# Patient Record
Sex: Male | Born: 1950
Health system: Southern US, Community
[De-identification: ages and names within clinical notes are randomized; demographics above are authoritative.]

## PROBLEM LIST (undated history)

## (undated) DIAGNOSIS — C801 Malignant (primary) neoplasm, unspecified: Secondary | ICD-10-CM

## (undated) DIAGNOSIS — E119 Type 2 diabetes mellitus without complications: Secondary | ICD-10-CM

## (undated) DIAGNOSIS — I10 Essential (primary) hypertension: Secondary | ICD-10-CM

## (undated) HISTORY — PX: APPENDECTOMY: SHX54

## (undated) HISTORY — PX: TONSILLECTOMY: SUR1361

## (undated) NOTE — *Deleted (*Deleted)
HEMATOLOGY/ONCOLOGY CLINIC NOTE  Date of Service: 04/30/2020  Patient Care Team: Hamrick, Durward Fortes, MD as PCP - General (Family Medicine)  CHIEF COMPLAINTS/PURPOSE OF CONSULTATION:  F/u for mantle cell lymphoma  HISTORY OF PRESENTING ILLNESS:   Dean Dixon is a wonderful 12 y.o. male who has been referred to [place] by Dr Burnell Blanks for evaluation and management of Lymphocytosis. The pt reports that he is doing well overall.   The pt reports that he has had a high lymphocyte count for several years but it has never been as high as now. 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 This most recent lymphocytosis was picked up by him looking at his own blood in the lab. He has not felt any differently in the last 6 months to 1 year. Pt has arthritis, tendonitis and bursitis in his shoulder. He has not had any concerns of RA and no significantly swollen joints. Pt tore an achilles tendon and then flew on a plane which lead to a blood clot. He has been on a testosterone supplement before but has not used it in a while. Pt has had diverticulitis. He has been losing weight slowly by eating better but has not noticed any unexpected weight loss. Pt does not smoke, does not share needles and has not needed any blood transfusions in the past. Pt denies any respiratory illness or infection symptoms in the last few months. He used to have an elevated IgM level about 30 years ago. He does have several fatty lipomas, a few which he has had removed, but there have been no concerns associated with them. He has recently stopped taking herbal supplements until he can figure out what is going on with his blood counts. He has been taking Glucosamine, Chondroitin & MSM as well as a 25 mg CBD gummy and 10 mg of CBD SL for his joint pain for a few years. He is currently taking Glipizide and Metformin. The Metformin did give the pt diarrhea, which has since resolved.   Most recent lab results (04/12/19) of CBC w/diff and CMP is  as follows: all values are WNL except for Glucose at 191, WBC at 30.5K, Lymphs Abs at 19.6K, Mono Abs at 3.6K, Eos Abs at 0.5K, Baso Abs at 0.3K, Abs Immature Granulocytes at 0.3K. 04/12/2019 PSA at 2.2  On review of systems, pt reports joint pain and denies rashes, fevers, chills, night sweats, unexpected weight loss, issues with swallowing, abdominal pain, bowel movement issues, leg pain, respiratory symptoms, diarrhea and any other symptoms.   On PMHx the pt reports HTN, Diabetes, Arthritis, Diverticulitis. On Social Hx the pt reports that he is a non-smoker.  INTERVAL HISTORY:  Dean Dixon is a wonderful 64 y.o. male who is here for evaluation and management of Mantle Cell Lymphoma. The patient's last visit with [place] was on 04/19/2020. The pt reports that he is doing well overall.  The pt reports ***  Of note since the patient's last visit, pt has had *** completed on *** with results revealing ***.  Lab results today (04/30/20) of CBC w/diff and CMP is as follows: all values are WNL except for ***.  On review of systems, pt reports *** and denies ***and any other symptoms.   A&P: -Discussed pt labwork today, 04/30/20; *** -***  Past Medical History:  Diagnosis Date  . Cancer (HCC)    Mantle Cell Lymphoma  . Diabetes mellitus without complication (HCC)   . Hypertension  SURGICAL HISTORY: Past Surgical History:  Procedure Laterality Date  . APPENDECTOMY    . TONSILLECTOMY      SOCIAL HISTORY: Social History   Socioeconomic History  . Marital status: Divorced    Spouse name: Not on file  . Number of children: Not on file  . Years of education: Not on file  . Highest education level: Not on file  Occupational History  . Not on file  Tobacco Use  . Smoking status: Never Smoker  . Smokeless tobacco: Never Used  Substance and Sexual Activity  . Alcohol use: Yes    Alcohol/week: 2.0 standard drinks    Types: 2 Standard drinks or equivalent per week  . Drug use:  No  . Sexual activity: Not on file  Other Topics Concern  . Not on file  Social History Narrative  . Not on file   Social Determinants of Health   Financial Resource Strain:   . Difficulty of Paying Living Expenses: Not on file  Food Insecurity:   . Worried About Programme researcher, broadcasting/film/video in the Last Year: Not on file  . Ran Out of Food in the Last Year: Not on file  Transportation Needs:   . Lack of Transportation (Medical): Not on file  . Lack of Transportation (Non-Medical): Not on file  Physical Activity:   . Days of Exercise per Week: Not on file  . Minutes of Exercise per Session: Not on file  Stress:   . Feeling of Stress : Not on file  Social Connections:   . Frequency of Communication with Friends and Family: Not on file  . Frequency of Social Gatherings with Friends and Family: Not on file  . Attends Religious Services: Not on file  . Active Member of Clubs or Organizations: Not on file  . Attends Banker Meetings: Not on file  . Marital Status: Not on file  Intimate Partner Violence:   . Fear of Current or Ex-Partner: Not on file  . Emotionally Abused: Not on file  . Physically Abused: Not on file  . Sexually Abused: Not on file    FAMILY HISTORY: No family history on file.  ALLERGIES:  is allergic to ivp dye [iodinated diagnostic agents].  MEDICATIONS:  No current facility-administered medications for this visit.   No current outpatient medications on file.   Facility-Administered Medications Ordered in Other Visits  Medication Dose Route Frequency Provider Last Rate Last Admin  . 0.9 %  sodium chloride infusion   Intravenous Continuous Kyle, Tyrone A, DO 100 mL/hr at 04/30/20 0700 Rate Verify at 04/30/20 0700  . acetaminophen (TYLENOL) tablet 650 mg  650 mg Oral Q6H PRN Ronaldo Miyamoto, Tyrone A, DO       Or  . acetaminophen (TYLENOL) suppository 650 mg  650 mg Rectal Q6H PRN Ronaldo Miyamoto, Tyrone A, DO   650 mg at 04/30/20 0140  . allopurinol (ZYLOPRIM) tablet 300  mg  300 mg Oral Daily Kyle, Tyrone A, DO      . amLODipine (NORVASC) tablet 10 mg  10 mg Oral Daily Kyle, Tyrone A, DO   10 mg at 05/02/2020 1930  . chlorhexidine gluconate (MEDLINE KIT) (PERIDEX) 0.12 % solution 15 mL  15 mL Mouth Rinse BID Migdalia Dk, MD   15 mL at 04/28/2020 2222  . Chlorhexidine Gluconate Cloth 2 % PADS 6 each  6 each Topical Daily Kyle, Tyrone A, DO   6 each at 05/19/2020 1824  . fentaNYL (SUBLIMAZE) injection 25-100 mcg  25-100 mcg Intravenous Q2H PRN Migdalia Dk, MD   100 mcg at 04/30/20 0524  . fentaNYL in NS (53mcg/ml) infusion-PREMIX  0-400 mcg/hr Intravenous Continuous Migdalia Dk, MD 5 mL/hr at 04/30/20 0700 50 mcg/hr at 04/30/20 0700  . guaiFENesin (ROBITUSSIN) 100 MG/5ML solution 200 mg  10 mL Oral Q4H PRN Ronaldo Miyamoto, Tyrone A, DO      . heparin ADULT infusion 100 units/mL (25000 units/224mL sodium chloride 0.45%)  1,500 Units/hr Intravenous Continuous Maurice March, RPH 15 mL/hr at 04/30/20 0700 1,500 Units/hr at 04/30/20 0700  . MEDLINE mouth rinse  15 mL Mouth Rinse 10 times per day Migdalia Dk, MD   15 mL at 04/30/20 0519  . nitroGLYCERIN 0.2 mg/mL in dextrose 5 % infusion           . nitroGLYCERIN 50 mg in dextrose 5 % 250 mL (0.2 mg/mL) infusion  0-200 mcg/min Intravenous Titrated Migdalia Dk, MD   Stopped at 05/05/2020 2102  . ondansetron (ZOFRAN) injection 4 mg  4 mg Intravenous Once Devoria Albe, MD      . pantoprazole (PROTONIX) injection 40 mg  40 mg Intravenous Q24H Luciano Cutter, MD   40 mg at 04/30/20 0146  . propofol (DIPRIVAN) 1000 MG/100ML infusion  5-80 mcg/kg/min Intravenous Titrated Migdalia Dk, MD 11.39 mL/hr at 04/30/20 0700 20 mcg/kg/min at 04/30/20 0700    REVIEW OF SYSTEMS:   A 10+ POINT REVIEW OF SYSTEMS WAS OBTAINED including neurology, dermatology, psychiatry, cardiac, respiratory, lymph, extremities, GI, GU, Musculoskeletal, constitutional, breasts, reproductive, HEENT.  All pertinent positives are  noted in the HPI.  All others are negative.   PHYSICAL EXAMINATION: ECOG PERFORMANCE STATUS:2  . There were no vitals filed for this visit. There were no vitals filed for this visit. .There is no height or weight on file to calculate BMI.   *** GENERAL:alert, in no acute distress and comfortable SKIN: no acute rashes, no significant lesions EYES: conjunctiva are pink and non-injected, sclera anicteric OROPHARYNX: MMM, no exudates, no oropharyngeal erythema or ulceration NECK: supple, no JVD LYMPH:  no palpable lymphadenopathy in the cervical, axillary or inguinal regions LUNGS: clear to auscultation b/l with normal respiratory effort HEART: regular rate & rhythm ABDOMEN:  normoactive bowel sounds , non tender, not distended. No palpable hepatosplenomegaly.  Extremity: no pedal edema PSYCH: alert & oriented x 3 with fluent speech NEURO: no focal motor/sensory deficits  LABORATORY DATA:  I have reviewed the data as listed  . CBC Latest Ref Rng & Units 04/30/2020 05/01/2020 05/06/2020  WBC 4.0 - 10.5 K/uL 1.7(L) 2.5(L) -  Hemoglobin 13.0 - 17.0 g/dL 8.3(L) 9.6(L) 10.2(L)  Hematocrit 39 - 52 % 25.9(L) 30.1(L) 30.0(L)  Platelets 150 - 400 K/uL 69(L) 82(L) -    . CMP Latest Ref Rng & Units 04/30/2020 05/10/2020 05/03/2020  Glucose 70 - 99 mg/dL 846(N) - 629(B)  BUN 8 - 23 mg/dL 28(U) - 13(K)  Creatinine 0.61 - 1.24 mg/dL 4.40(N) - 0.27  Sodium 135 - 145 mmol/L 142 140 139  Potassium 3.5 - 5.1 mmol/L 5.9(H) 5.0 4.8  Chloride 98 - 111 mmol/L 107 - 99  CO2 22 - 32 mmol/L 24 - 23  Calcium 8.9 - 10.3 mg/dL 8.2(L) - 10.6(H)  Total Protein 6.5 - 8.1 g/dL 5.0(L) - 5.9(L)  Total Bilirubin 0.3 - 1.2 mg/dL 0.4 - 0.9  Alkaline Phos 38 - 126 U/L 70 - 108  AST 15 - 41 U/L 19 - 19  ALT 0 - 44 U/L 17 - 18   06/10/2019 Flow Pathology Report ([id number]):   06/10/2019 Surgical Pathology ([id number]):   Surgical Pathology  CASE: [id number]  PATIENT: Dean Dixon  Flow Pathology  Report      Clinical history: Lymphocytosis      DIAGNOSIS:   -Abnormal B-cell population identified.  -See comment   COMMENT:   Analysis of the lymphoid population shows a major abnormal B-cell  population representing 74% of all lymphocytes with expression of  HLA-Dr, CD20 and CD5. This is associated with extremely dim/negative  staining for surface immunoglobulin light chains hindering assessment  and or quantitation of clonality. No CD10, CD38, or CD34 expression is  identified. The findings are most consistent with a B-cell  lymphoproliferative process. Examination of peripheral blood shows  predominance of medium and large atypical lymphoid cells characterized  by coarse chromatin, moderately abundant agranular blue cytoplasm and  occasional cells with small nucleoli. Consideration should be given to  chronic lymphocytic leukemia variant, mantle cell lymphoma, as well as  other CD5 positive B-cell lymphoproliferative processes. FISH studies  may be helpful in this regard.   GATING AND PHENOTYPIC ANALYSIS:   Gated population: Flow cytometric immunophenotyping is performed using  antibodies to the antigens listed in the table below. Electronic gates  are placed around a cell cluster displaying light scatter properties  corresponding to: lymphocytes   Abnormal Cells in gated population: 74%   Phenotype of Abnormal Cells: CD5, CD20, HLA-Dr    04/26/2019 Flow Cytometry [phone]):    05/02/2019 FISH:    WBC Neutros Lymphs  02/05/2017 9.9K 6.0K 2.6K  10/06/2017 12.6K 7.7K 3.2K  11/02/2018 12.3K 5.2K 5.6K    RADIOGRAPHIC STUDIES: I have personally reviewed the radiological images as listed and agreed with the findings in the report. CT ABDOMEN PELVIS WO CONTRAST  Addendum Date: 04/19/2020   ADDENDUM REPORT: 04/19/2020 12:54 ADDENDUM: The peritoneal fluid within the pelvis is not enhancing as this is a non IV contrast imaging exam. Therefore would  described fluid collection as having thickened rim. Favor peritoneal metastasis. Findings conveyed toSCOTT GOLDSTON on 04/19/2020  at12:54. Electronically Signed   By: Genevive Bi M.D.   On: 04/19/2020 12:54   Result Date: 04/19/2020 CLINICAL DATA:  Abdominal pain.  Tachycardia.  Mantle cell lymphoma EXAM: CT ABDOMEN AND PELVIS WITHOUT CONTRAST TECHNIQUE: Multidetector CT imaging of the abdomen and pelvis was performed following the standard protocol without IV contrast. COMPARISON:  PET-CT scan 01/02/2020 FINDINGS: Lower chest: New small RIGHT effusion. No pericardial effusion. There is new precordial adenopathy anterior to the RIGHT heart and liver (image 9/2). Hepatobiliary: New intraperitoneal free fluid along the margin of the RIGHT hepatic lobe. This fluid is simple attenuation. The liver appears unchanged on noncontrast exam. Large cyst the LEFT hepatic lobe. Gallbladder normal. Pancreas: Pancreas is grossly normal noncontrast exam Spleen: . spleen is unchanged Adrenals/urinary tract: Adrenal glands kidneys and ureters normal. Bladder Stomach/Bowel: Stomach duodenum normal. There is a new mass lesion in the LEFT upper quadrant measuring 14.2 by 13.0 cm. The mass may be associated the small bowel or the small bowel mesentery. The descending colon is also involving the mass (image 43/2. There is extensive fluid within the leaves of the mesentery of the distal small bowel. Appendix normal. Ascending transverse colon normal. The descending colon tracks along the new LEFT upper quadrant mass. Vascular/Lymphatic: New large LEFT upper quadrant mass either represents lymphoma involving the small bowel or mesentery. Mass is very large described the stomach  section. Additionally there is new periportal adenopathy with enlarged lymph nodes along the aorta measuring up to 2 cm (image 44/2. There is nodularity along the greater omentum abdominal on the LEFT. Reproductive: Prostate unremarkable Other: Small volume  free fluid the pelvis. There is enhancing rim to the fluid (image 73/2 which could indicate peritonitis. Musculoskeletal: No aggressive osseous lesion. IMPRESSION: 1. Dominant finding is new large (15 cm) lymphoid mass within LEFT upper quadrant. Mass is new from PET-CT 01/02/2020. Differential includes massive mesenteric adenopathy versus lymphoma of the small bowel or descending colon. No oral or IV contrast. 2. New periaortic retroperitoneal lymphadenopathy. New precordial adenopathy. 3. Extensive fluid within the mesentery and omental thickening in LEFT lower quadrant. 4. Free fluid the pelvis with thin enhancing rim suggest peritonitis. 5. No evidence of bowel perforation. No evidence of bowel obstruction. Electronically Signed: By: Genevive Bi M.D. On: 04/19/2020 12:39   DG Chest 1 View  Result Date: 05/15/2020 CLINICAL DATA:  Elevated blood pressure. EXAM: CHEST  1 VIEW COMPARISON:  April 29, 2020 FINDINGS: The lung volumes are low. There are small bilateral pleural effusions with adjacent atelectasis. The heart size is mildly enlarged. The lung volumes are low. There is no pneumothorax. IMPRESSION: 1. Low lung volumes. 2. Small bilateral pleural effusions with adjacent atelectasis. Electronically Signed   By: Katherine Mantle M.D.   On: 04/27/2020 20:42   DG Chest 2 View  Result Date: 04/27/2020 CLINICAL DATA:  Shortness of breath lasting several days. Groin swelling and redness. EXAM: CHEST - 2 VIEW COMPARISON:  Chest CT 04/19/20 FINDINGS: Stable cardiomediastinal contours. Low lung volumes. Unchanged small bilateral pleural effusions. No airspace densities. IMPRESSION: Low lung volumes with persistent small bilateral pleural effusions. Electronically Signed   By: Signa Kell M.D.   On: 04/27/2020 05:09   DG Abd 1 View  Result Date: 05/18/2020 CLINICAL DATA:  Tube placement EXAM: ABDOMEN - 1 VIEW COMPARISON:  April 29, 2020 FINDINGS: The enteric tube projects over the gastric  antrum/pylorus. The tube is pointed distally. The visualized bowel gas pattern is unremarkable. IMPRESSION: The enteric tube projects over the gastric antrum/pylorus. Electronically Signed   By: Katherine Mantle M.D.   On: 05/17/2020 22:05   CT CHEST WO CONTRAST  Result Date: 04/19/2020 CLINICAL DATA:  Pneumonia. Pleural effusion. Concern for metastases. EXAM: CT CHEST WITHOUT CONTRAST TECHNIQUE: Multidetector CT imaging of the chest was performed following the standard protocol without IV contrast. COMPARISON:  CT dated Nov 14, 2019 FINDINGS: Cardiovascular: The heart size is stable. Coronary artery calcifications are noted. There is no significant pericardial effusion. Mediastinum/Nodes: -- No mediastinal lymphadenopathy. -- No hilar lymphadenopathy. --there is left axillary adenopathy. This has progressed since the prior study. There is mild right axillary adenopathy, progressed from prior study. -- No supraclavicular lymphadenopathy. -- Normal thyroid gland where visualized. -there is mild diffuse esophageal wall thickening. Lungs/Pleura: There are trace to small bilateral pleural effusions, right greater than left. There is atelectasis at the right lung base. There is no pneumothorax. No area of consolidation concerning for pneumonia. Upper Abdomen: There is a partially visualized mass in the left upper quadrant, better visualized on the patient's prior CT. There is free fluid in the upper abdomen. Multiple enlarged pericardiophrenic lymph nodes are noted. There appears to be retroperitoneal adenopathy. Musculoskeletal: No chest wall abnormality. No bony spinal canal stenosis. IMPRESSION: 1. Trace to small bilateral pleural effusions, right greater than left. There is atelectasis at the right lung base. No area of consolidation concerning  for pneumonia. 2. Mild diffuse esophageal wall thickening. Correlation with patient's symptoms is recommended. 3. Worsening axillary adenopathy. 4. Partially visualized  mass in the left upper quadrant, better visualized on the patient's prior CT. 5. Small volume ascites. Aortic Atherosclerosis (ICD10-I70.0). Electronically Signed   By: Katherine Mantle M.D.   On: 04/19/2020 20:37   CT Angio Chest PE W and/or Wo Contrast  Result Date: 05/15/20 CLINICAL DATA:  46 year old male with history of shortness of breath for the past few days. Abdominal distension. History of mantle cell lymphoma. EXAM: CT ANGIOGRAPHY CHEST CT ABDOMEN AND PELVIS WITH CONTRAST TECHNIQUE: Multidetector CT imaging of the chest was performed using the standard protocol during bolus administration of intravenous contrast. Multiplanar CT image reconstructions and MIPs were obtained to evaluate the vascular anatomy. Multidetector CT imaging of the abdomen and pelvis was performed using the standard protocol during bolus administration of intravenous contrast. CONTRAST:  OMNIPAQUE IOHEXOL 350 MG/ML SOLN COMPARISON:  CT the abdomen and pelvis 04/19/2020. CT the chest 04/19/2020. FINDINGS: CTA CHEST FINDINGS Cardiovascular: Study is limited by patient respiratory motion. However, despite this limitation there are segmental sized filling defects within pulmonary arteries to the left upper and lower lobes best appreciated on axial images 73 and 106 of series 5, and axial image 148 of series 5 respectively, compatible with pulmonary embolism. Heart size is normal. There is no significant pericardial fluid, thickening or pericardial calcification. Aortic atherosclerosis. No definite coronary artery calcifications. Mediastinum/Nodes: Multiple enlarged anterior mediastinal lymph nodes noted inferiorly, immediately above the right hemidiaphragm, measuring up to 2.3 cm in short axis (axial image 13 of series 11), increased compared to the prior study. Prominent but nonenlarged distal paraesophageal lymph node measuring 8 mm in short axis, nonspecific. Circumferential thickening of the distal third of the  esophagus. Mildly enlarged left axillary lymph nodes measuring up to 1.3 cm in short axis. Lungs/Pleura: Collapse of the trachea and mainstem bronchi during expiration, indicative of tracheobronchomalacia. No acute consolidative airspace disease. Small bilateral pleural effusions lying dependently. No definite suspicious appearing pulmonary nodules or masses are noted. Musculoskeletal: There are no aggressive appearing lytic or blastic lesions noted in the visualized portions of the skeleton. Review of the MIP images confirms the above findings. CT ABDOMEN and PELVIS FINDINGS Hepatobiliary: Multiple well-defined low-attenuation lesions are again noted throughout the liver, similar in size, number and distribution to the prior study, largest of which are compatible with simple cysts measuring up to 5.3 x 3.7 cm in segment 2 and for a (axial image 18 of series 11). Smaller lesions are too small to definitively characterize, but also favored to represent small cysts or biliary hamartomas. No new suspicious hepatic lesions. No intra or extrahepatic biliary ductal dilatation. 6 mm calcified gallstone lying dependently in the gallbladder. Gallbladder is not distended. Pancreas: No pancreatic mass. No pancreatic ductal dilatation. No pancreatic or peripancreatic fluid collections or inflammatory changes. Spleen: Unremarkable. Adrenals/Urinary Tract: 1.2 cm low-attenuation lesion in the upper pole of the right kidney and exophytic 2.4 cm low-attenuation lesion in the upper pole of the left kidney, compatible with simple cysts. Other subcentimeter low-attenuation lesions in both kidneys, too small to definitively characterize, but favored to represent tiny cysts. No hydroureteronephrosis. Urinary bladder is nearly decompressed, but otherwise unremarkable in appearance. Stomach/Bowel: Stomach is unremarkable in appearance. No pathologic dilatation of small bowel or colon. Multiple bowel loops in the left upper quadrant are  intimately associated with the large soft tissue mass (discussed below). Colonic diverticulosis, particularly in the  sigmoid colon, without definitive evidence to suggest an acute diverticulitis at this time. Vascular/Lymphatic: Aortic atherosclerosis, without evidence of aneurysm or dissection in the abdominal or pelvic vasculature. Extensive lymphadenopathy noted in the abdomen, most evident in the retroperitoneum where there are bulky para-aortic nodal masses measuring up to 2.3 x 4.1 cm near the left renal artery (axial image 41 of series 11), 3.2 x 4.6 cm adjacent to the infrarenal abdominal aorta (axial image 48 of series 11), and 3.4 x 3.2 cm adjacent to the aortic bifurcation (axial image 54 of series 11), all of which appear similar to the recent prior study. Bulky celiac axis and gastrohepatic ligament lymph nodes are noted, measuring up to 1.9 cm in short axis (axial image 24 of series 11). Several prominent but nonenlarged pelvic lymph nodes are noted (nonspecific). Reproductive: Prostate gland and seminal vesicles are unremarkable in appearance. Other: Bulky poorly defined soft tissue mass in the left upper quadrant of the abdomen which is intimately associated with multiple adjacent bowel loops, and difficult to distinctly defined, but estimated to measure approximately 15.5 x 13.5 x 13.4 cm (axial image 38 of series 11 and coronal image 53 of series 14). Moderate volume of ascites. Diffuse enhancement of the peritoneal membranes, indicative of widespread peritoneal metastatic disease. Extensive omental caking, best appreciated in the left side of the abdomen where the bulkiest portion of this measures approximately 14.9 x 4.0 cm (axial image 47 of series 11). No pneumoperitoneum. Musculoskeletal: There are no aggressive appearing lytic or blastic lesions noted in the visualized portions of the skeleton. Review of the MIP images confirms the above findings. IMPRESSION: 1. Study is positive for  segmental sized pulmonary emboli in the left lung. These appear nonocclusive at this time, and there is no associated pulmonary hemorrhage to suggest frank pulmonary infarct. 2. Bulky soft tissue mass in the left upper quadrant with extensive lymphadenopathy in the chest and abdomen, and evidence of widespread intraperitoneal metastatic disease, as detailed above. 3. Small bilateral pleural effusions lying dependently. 4. Aortic atherosclerosis. 5. Tracheobronchomalacia. 6. Cholelithiasis. 7. Colonic diverticulosis. Electronically Signed   By: Trudie Reed M.D.   On: 05/20/2020 12:50   CT Abdomen Pelvis W Contrast  Result Date: 05/12/2020 CLINICAL DATA:  68 year old male with history of shortness of breath for the past few days. Abdominal distension. History of mantle cell lymphoma. EXAM: CT ANGIOGRAPHY CHEST CT ABDOMEN AND PELVIS WITH CONTRAST TECHNIQUE: Multidetector CT imaging of the chest was performed using the standard protocol during bolus administration of intravenous contrast. Multiplanar CT image reconstructions and MIPs were obtained to evaluate the vascular anatomy. Multidetector CT imaging of the abdomen and pelvis was performed using the standard protocol during bolus administration of intravenous contrast. CONTRAST:  OMNIPAQUE IOHEXOL 350 MG/ML SOLN COMPARISON:  CT the abdomen and pelvis 04/19/2020. CT the chest 04/19/2020. FINDINGS: CTA CHEST FINDINGS Cardiovascular: Study is limited by patient respiratory motion. However, despite this limitation there are segmental sized filling defects within pulmonary arteries to the left upper and lower lobes best appreciated on axial images 73 and 106 of series 5, and axial image 148 of series 5 respectively, compatible with pulmonary embolism. Heart size is normal. There is no significant pericardial fluid, thickening or pericardial calcification. Aortic atherosclerosis. No definite coronary artery calcifications. Mediastinum/Nodes: Multiple  enlarged anterior mediastinal lymph nodes noted inferiorly, immediately above the right hemidiaphragm, measuring up to 2.3 cm in short axis (axial image 13 of series 11), increased compared to the prior study. Prominent but nonenlarged  distal paraesophageal lymph node measuring 8 mm in short axis, nonspecific. Circumferential thickening of the distal third of the esophagus. Mildly enlarged left axillary lymph nodes measuring up to 1.3 cm in short axis. Lungs/Pleura: Collapse of the trachea and mainstem bronchi during expiration, indicative of tracheobronchomalacia. No acute consolidative airspace disease. Small bilateral pleural effusions lying dependently. No definite suspicious appearing pulmonary nodules or masses are noted. Musculoskeletal: There are no aggressive appearing lytic or blastic lesions noted in the visualized portions of the skeleton. Review of the MIP images confirms the above findings. CT ABDOMEN and PELVIS FINDINGS Hepatobiliary: Multiple well-defined low-attenuation lesions are again noted throughout the liver, similar in size, number and distribution to the prior study, largest of which are compatible with simple cysts measuring up to 5.3 x 3.7 cm in segment 2 and for a (axial image 18 of series 11). Smaller lesions are too small to definitively characterize, but also favored to represent small cysts or biliary hamartomas. No new suspicious hepatic lesions. No intra or extrahepatic biliary ductal dilatation. 6 mm calcified gallstone lying dependently in the gallbladder. Gallbladder is not distended. Pancreas: No pancreatic mass. No pancreatic ductal dilatation. No pancreatic or peripancreatic fluid collections or inflammatory changes. Spleen: Unremarkable. Adrenals/Urinary Tract: 1.2 cm low-attenuation lesion in the upper pole of the right kidney and exophytic 2.4 cm low-attenuation lesion in the upper pole of the left kidney, compatible with simple cysts. Other subcentimeter low-attenuation  lesions in both kidneys, too small to definitively characterize, but favored to represent tiny cysts. No hydroureteronephrosis. Urinary bladder is nearly decompressed, but otherwise unremarkable in appearance. Stomach/Bowel: Stomach is unremarkable in appearance. No pathologic dilatation of small bowel or colon. Multiple bowel loops in the left upper quadrant are intimately associated with the large soft tissue mass (discussed below). Colonic diverticulosis, particularly in the sigmoid colon, without definitive evidence to suggest an acute diverticulitis at this time. Vascular/Lymphatic: Aortic atherosclerosis, without evidence of aneurysm or dissection in the abdominal or pelvic vasculature. Extensive lymphadenopathy noted in the abdomen, most evident in the retroperitoneum where there are bulky para-aortic nodal masses measuring up to 2.3 x 4.1 cm near the left renal artery (axial image 41 of series 11), 3.2 x 4.6 cm adjacent to the infrarenal abdominal aorta (axial image 48 of series 11), and 3.4 x 3.2 cm adjacent to the aortic bifurcation (axial image 54 of series 11), all of which appear similar to the recent prior study. Bulky celiac axis and gastrohepatic ligament lymph nodes are noted, measuring up to 1.9 cm in short axis (axial image 24 of series 11). Several prominent but nonenlarged pelvic lymph nodes are noted (nonspecific). Reproductive: Prostate gland and seminal vesicles are unremarkable in appearance. Other: Bulky poorly defined soft tissue mass in the left upper quadrant of the abdomen which is intimately associated with multiple adjacent bowel loops, and difficult to distinctly defined, but estimated to measure approximately 15.5 x 13.5 x 13.4 cm (axial image 38 of series 11 and coronal image 53 of series 14). Moderate volume of ascites. Diffuse enhancement of the peritoneal membranes, indicative of widespread peritoneal metastatic disease. Extensive omental caking, best appreciated in the left  side of the abdomen where the bulkiest portion of this measures approximately 14.9 x 4.0 cm (axial image 47 of series 11). No pneumoperitoneum. Musculoskeletal: There are no aggressive appearing lytic or blastic lesions noted in the visualized portions of the skeleton. Review of the MIP images confirms the above findings. IMPRESSION: 1. Study is positive for segmental sized pulmonary emboli  in the left lung. These appear nonocclusive at this time, and there is no associated pulmonary hemorrhage to suggest frank pulmonary infarct. 2. Bulky soft tissue mass in the left upper quadrant with extensive lymphadenopathy in the chest and abdomen, and evidence of widespread intraperitoneal metastatic disease, as detailed above. 3. Small bilateral pleural effusions lying dependently. 4. Aortic atherosclerosis. 5. Tracheobronchomalacia. 6. Cholelithiasis. 7. Colonic diverticulosis. Electronically Signed   By: Trudie Reed M.D.   On: 05/08/2020 12:50   DG CHEST PORT 1 VIEW  Result Date: 04/30/2020 CLINICAL DATA:  Central line placement EXAM: PORTABLE CHEST 1 VIEW COMPARISON:  Radiograph 05/15/2020 FINDINGS: Endotracheal tube tip terminates 2.2 cm from the expected location of the carina. Transesophageal tube tip and side port distal to the GE junction. Right IJ catheter tip terminates in the right atrium. Telemetry leads overlie the chest. Layering bilateral effusions adjacent passive atelectasis. Additional bandlike subsegmental atelectasis in the lungs as well. Stable borderline cardiomegaly accounting for portable technique. No acute osseous or soft tissue abnormality. Degenerative changes are present in the imaged spine and shoulders. IMPRESSION: 1. Endotracheal tube tip terminates 2.2 cm from the expected location of the carina. Could consider retraction 2 cm to the mid trachea. 2. Transesophageal tube tip and side port distal to the GE junction. 3. Right IJ catheter tip terminates in the right atrium. 4. Layering  bilateral effusions with adjacent passive atelectasis and vascular congestion. 5. Stable borderline cardiomegaly. Electronically Signed   By: Kreg Shropshire M.D.   On: 04/30/2020 01:14   DG CHEST PORT 1 VIEW  Result Date: 04/27/2020 CLINICAL DATA:  Endotracheal tube placement EXAM: PORTABLE CHEST 1 VIEW COMPARISON:  April 29, 2020 FINDINGS: The endotracheal tube terminates above the carina. The lung volumes are low. Again identified are bilateral pleural effusions with adjacent atelectasis. The heart size is stable. There is no pneumothorax. The enteric tube terminates below the left hemidiaphragm. IMPRESSION: Lines and tubes as above.  Otherwise, stable exam. Electronically Signed   By: Katherine Mantle M.D.   On: 05/20/2020 22:04   DG Chest Portable 1 View  Result Date: 04/19/2020 CLINICAL DATA:  Cough, wheezing EXAM: PORTABLE CHEST 1 VIEW COMPARISON:  01/09/2020 FINDINGS: Low lung volumes. Minimal atelectasis or scarring at the left lung base. No pleural effusion or pneumothorax. Heart size is within normal limits for technique. IMPRESSION: Minimal atelectasis or scarring at the left lung base. Electronically Signed   By: Guadlupe Spanish M.D.   On: 04/19/2020 12:18   DG Abd Portable 2 Views  Result Date: 05/18/2020 CLINICAL DATA:  Abdominal distension and pain. EXAM: PORTABLE ABDOMEN - 2 VIEW COMPARISON:  04/19/2020 FINDINGS: There is mild gaseous distension of the stomach. No dilated loops of large or small bowel. There is no evidence of free air. No radio-opaque calculi or other significant radiographic abnormality is seen. IMPRESSION: Nonobstructive bowel gas pattern. Electronically Signed   By: Signa Kell M.D.   On: 04/28/2020 06:12   ECHOCARDIOGRAM COMPLETE  Result Date: 04/20/2020    ECHOCARDIOGRAM REPORT   Patient Name:   Dean Dixon Date of Exam: 04/20/2020 Medical Rec #:  [id number]    Height:       64.0 in Accession #:    [id number]   Weight:       210.1 lb Date of Birth:   12-18-1950   BSA:          1.998 m Patient Age:    68 years     BP:  156/83 mmHg Patient Gender: M            HR:           88 bpm. Exam Location:  Inpatient Procedure: 2D Echo, Cardiac Doppler and Color Doppler Indications:    Chemo evaluation  History:        Patient has prior history of Echocardiogram examinations, most                 recent 11/14/2019. COPD; Risk Factors:Hypertension and Diabetes.                 Mantle cell lymphoma.  Sonographer:    Lavenia Atlas Referring Phys: [id number] KRISTIN R CURCIO IMPRESSIONS  1. Left ventricular ejection fraction, by estimation, is 65 to 70%. The left ventricle has normal function. The left ventricle has no regional wall motion abnormalities. Left ventricular diastolic parameters are consistent with Grade I diastolic dysfunction (impaired relaxation).  2. Right ventricular systolic function is normal. The right ventricular size is normal. Tricuspid regurgitation signal is inadequate for assessing PA pressure.  3. The mitral valve is grossly normal. Trivial mitral valve regurgitation. No evidence of mitral stenosis.  4. The aortic valve is tricuspid. There is mild calcification of the aortic valve. There is mild thickening of the aortic valve. Aortic valve regurgitation is not visualized. Mild aortic valve sclerosis is present, with no evidence of aortic valve stenosis.  5. The inferior vena cava is normal in size with greater than 50% respiratory variability, suggesting right atrial pressure of 3 mmHg. Comparison(s): Changes from prior study are noted. EF is now 65-70%. FINDINGS  Left Ventricle: Left ventricular ejection fraction, by estimation, is 65 to 70%. The left ventricle has normal function. The left ventricle has no regional wall motion abnormalities. The left ventricular internal cavity size was normal in size. There is  no left ventricular hypertrophy. Left ventricular diastolic parameters are consistent with Grade I diastolic dysfunction (impaired  relaxation). Normal left ventricular filling pressure. Right Ventricle: The right ventricular size is normal. No increase in right ventricular wall thickness. Right ventricular systolic function is normal. Tricuspid regurgitation signal is inadequate for assessing PA pressure. Left Atrium: Left atrial size was normal in size. Right Atrium: Right atrial size was normal in size. Pericardium: Trivial pericardial effusion is present. Presence of pericardial fat pad. Mitral Valve: The mitral valve is grossly normal. Trivial mitral valve regurgitation. No evidence of mitral valve stenosis. Tricuspid Valve: The tricuspid valve is grossly normal. Tricuspid valve regurgitation is not demonstrated. No evidence of tricuspid stenosis. Aortic Valve: The aortic valve is tricuspid. There is mild calcification of the aortic valve. There is mild thickening of the aortic valve. Aortic valve regurgitation is not visualized. Mild aortic valve sclerosis is present, with no evidence of aortic valve stenosis. Aortic valve mean gradient measures 11.1 mmHg. Aortic valve peak gradient measures 16.7 mmHg. Aortic valve area, by VTI measures 2.42 cm. Pulmonic Valve: The pulmonic valve was grossly normal. Pulmonic valve regurgitation is not visualized. No evidence of pulmonic stenosis. Aorta: The aortic root is normal in size and structure. Venous: The right upper pulmonary vein is normal. The inferior vena cava is normal in size with greater than 50% respiratory variability, suggesting right atrial pressure of 3 mmHg. IAS/Shunts: The atrial septum is grossly normal. Additional Comments: There is a small pleural effusion in the left lateral region.  LEFT VENTRICLE PLAX 2D LVIDd:         4.30 cm  Diastology LVIDs:  2.60 cm  LV e' medial:    8.81 cm/s LV PW:         1.10 cm  LV E/e' medial:  7.2 LV IVS:        1.10 cm  LV e' lateral:   7.51 cm/s LVOT diam:     2.30 cm  LV E/e' lateral: 8.4 LV SV:         92 LV SV Index:   46 LVOT Area:      4.15 cm  RIGHT VENTRICLE RV Basal diam:  3.20 cm RV S prime:     11.00 cm/s TAPSE (M-mode): 2.9 cm LEFT ATRIUM             Index       RIGHT ATRIUM           Index LA diam:        3.30 cm 1.65 cm/m  RA Area:     15.30 cm LA Vol (A2C):   43.2 ml 21.62 ml/m RA Volume:   37.60 ml  18.82 ml/m LA Vol (A4C):   30.7 ml 15.37 ml/m LA Biplane Vol: 36.9 ml 18.47 ml/m  AORTIC VALVE AV Area (Vmax):    2.42 cm AV Area (Vmean):   2.14 cm AV Area (VTI):     2.42 cm AV Vmax:           204.20 cm/s AV Vmean:          159.222 cm/s AV VTI:            0.381 m AV Peak Grad:      16.7 mmHg AV Mean Grad:      11.1 mmHg LVOT Vmax:         119.00 cm/s LVOT Vmean:        82.200 cm/s LVOT VTI:          0.222 m LVOT/AV VTI ratio: 0.58  AORTA Ao Root diam: 3.10 cm MITRAL VALVE MV Area (PHT): 4.06 cm     SHUNTS MV Decel Time: 187 msec     Systemic VTI:  0.22 m MV E velocity: 63.40 cm/s   Systemic Diam: 2.30 cm MV A velocity: 104.00 cm/s MV E/A ratio:  0.61 Lennie Odor MD Electronically signed by Lennie Odor MD Signature Date/Time: 04/20/2020/10:32:25 AM    Final    [place] ASCITES (ABDOMEN LIMITED)  Result Date: 04/19/2020 CLINICAL DATA:  52 year old male with abdominal distension, ascites EXAM: LIMITED ABDOMEN ULTRASOUND FOR ASCITES TECHNIQUE: Limited ultrasound survey for ascites was performed in all four abdominal quadrants. COMPARISON:  CT abdomen/pelvis 04/19/2020 FINDINGS: Sonographic interrogation of the abdomen was performed in anticipation of paracentesis. Unfortunately, the volume is a ascites is quite small and insufficient for drainage. There is however extensive omental caking and peritoneal nodularity. IMPRESSION: 1. Small volume ascites, insufficient for paracentesis. 2. Extensive omental caking and peritoneal nodularity consistent with peritoneal carcinomatosis. Electronically Signed   By: Malachy Moan M.D.   On: 04/19/2020 16:53   [place] EKG SITE RITE  Result Date: 04/19/2020 If Site Rite image not attached,  placement could not be confirmed due to current cardiac rhythm.   ASSESSMENT & PLAN:   30 yo male with   1) Stage IV Mantle cell cell lymphoma CLL FISH shows 17p mutation -04/26/2019 Flow Cytometry [phone]) revealed "Abnormal B-cell population identified. CD5+ clonal lymphoproliferative disorder" -04/26/2019 FISH revealed "Positive for p53 (17p13) deletion." -05/31/2019 PET/CT ([id number]) revealed "1. There are multiple prominent lymph nodes in the neck, both axilla, the  retroperitoneum and pelvis with low level hypermetabolic activity consistent with chronic lymphocytic leukemia. Deauville 3 and 4. 2. Mild splenomegaly and mild generalized splenic hypermetabolic activity 3. No evidence of bone marrow involvement." -06/10/2019 Flow Pathology Report ([id number]) revealed "Abnormal B-cell population." -06/10/2019 Surgical Pathology Report ([id number])  revealed "Atypical lymphoid proliferation consistent with mantle cell lymphoma." -09/19/19 PET Skull Base to Thigh [phone])-- favorable response to current treatment -01/02/2020 PET/CT ([id number]) revealed "1. Continued further decrease in size and FDG accumulation in index cervical, axillary, retroperitoneal, and pelvic lymph nodes. No lymphadenopathy by size criteria today. FDG accumulation in these lymph nodes is compatible with Deauville 2 category. 2. Interval decrease in size with resolution of hypermetabolism associated with the spleen previously."   2) Rigors from Rituxan - needing demerol as premedication. Rituxan rate to be capped at 150mg /hour  3) Thrombocytopenia from chemotherapy  4) Neutropenia ? Drug related. ? Idiosyncratic reaction to Rituxan  5) Rituxan related ILD  6) significant new abdominal distention concerning for acute abdomen ?  Bowel obstruction ?  Lymphoma recurrence.  Other acute intra-abdominal issues.  7) new hypercalcemia- ?  Related to dehydration, ?  Lymphoma recurrence or other etiologies.   PLAN: ***   FOLLOW UP: ***   The total time spent in the appt was *** minutes and more than 50% was on counseling and direct patient cares.  All of the patient's questions were answered with apparent satisfaction. The patient knows to call the clinic with any problems, questions or concerns.   Wyvonnia Lora MD MS AAHIVMS [facility] Hematology/Oncology Physician [facility]  (Office):       [phone] (Work cell):  [phone] (Fax):           [phone]  04/30/2020 7:23 AM  I, Carollee Herter, am acting as a scribe for Dr. Wyvonnia Lora.   {Add Production assistant, radio Statement}

---

## 2008-08-18 ENCOUNTER — Encounter: Admission: RE | Admit: 2008-08-18 | Discharge: 2008-08-18 | Payer: Self-pay | Admitting: Family Medicine

## 2008-12-29 ENCOUNTER — Encounter: Admission: RE | Admit: 2008-12-29 | Discharge: 2008-12-29 | Payer: Self-pay | Admitting: Family Medicine

## 2010-07-14 ENCOUNTER — Encounter: Payer: Self-pay | Admitting: Family Medicine

## 2014-05-24 DIAGNOSIS — Z23 Encounter for immunization: Secondary | ICD-10-CM | POA: Diagnosis not present

## 2014-05-24 DIAGNOSIS — E291 Testicular hypofunction: Secondary | ICD-10-CM | POA: Diagnosis not present

## 2014-06-06 DIAGNOSIS — E291 Testicular hypofunction: Secondary | ICD-10-CM | POA: Diagnosis not present

## 2014-06-21 DIAGNOSIS — E291 Testicular hypofunction: Secondary | ICD-10-CM | POA: Diagnosis not present

## 2014-06-28 DIAGNOSIS — E1165 Type 2 diabetes mellitus with hyperglycemia: Secondary | ICD-10-CM | POA: Diagnosis not present

## 2014-06-28 DIAGNOSIS — Z9181 History of falling: Secondary | ICD-10-CM | POA: Diagnosis not present

## 2014-06-28 DIAGNOSIS — Z6841 Body Mass Index (BMI) 40.0 and over, adult: Secondary | ICD-10-CM | POA: Diagnosis not present

## 2014-06-28 DIAGNOSIS — I10 Essential (primary) hypertension: Secondary | ICD-10-CM | POA: Diagnosis not present

## 2014-06-28 DIAGNOSIS — Z23 Encounter for immunization: Secondary | ICD-10-CM | POA: Diagnosis not present

## 2014-06-28 DIAGNOSIS — E782 Mixed hyperlipidemia: Secondary | ICD-10-CM | POA: Diagnosis not present

## 2014-06-28 DIAGNOSIS — E291 Testicular hypofunction: Secondary | ICD-10-CM | POA: Diagnosis not present

## 2014-06-28 DIAGNOSIS — Z1389 Encounter for screening for other disorder: Secondary | ICD-10-CM | POA: Diagnosis not present

## 2014-07-05 DIAGNOSIS — E291 Testicular hypofunction: Secondary | ICD-10-CM | POA: Diagnosis not present

## 2014-07-19 DIAGNOSIS — E291 Testicular hypofunction: Secondary | ICD-10-CM | POA: Diagnosis not present

## 2014-08-02 DIAGNOSIS — E291 Testicular hypofunction: Secondary | ICD-10-CM | POA: Diagnosis not present

## 2014-08-16 DIAGNOSIS — E291 Testicular hypofunction: Secondary | ICD-10-CM | POA: Diagnosis not present

## 2014-08-28 DIAGNOSIS — E291 Testicular hypofunction: Secondary | ICD-10-CM | POA: Diagnosis not present

## 2014-09-12 DIAGNOSIS — E291 Testicular hypofunction: Secondary | ICD-10-CM | POA: Diagnosis not present

## 2014-09-26 DIAGNOSIS — E291 Testicular hypofunction: Secondary | ICD-10-CM | POA: Diagnosis not present

## 2014-10-11 DIAGNOSIS — E291 Testicular hypofunction: Secondary | ICD-10-CM | POA: Diagnosis not present

## 2014-10-23 ENCOUNTER — Emergency Department (HOSPITAL_COMMUNITY): Payer: Medicare Other

## 2014-10-23 ENCOUNTER — Encounter (HOSPITAL_COMMUNITY): Payer: Self-pay | Admitting: General Practice

## 2014-10-23 ENCOUNTER — Observation Stay (HOSPITAL_COMMUNITY)
Admission: EM | Admit: 2014-10-23 | Discharge: 2014-10-24 | Disposition: A | Discharge status: Home / Self Care | Payer: Medicare Other | Attending: Surgery | Admitting: Surgery

## 2014-10-23 DIAGNOSIS — S20211A Contusion of right front wall of thorax, initial encounter: Secondary | ICD-10-CM | POA: Diagnosis not present

## 2014-10-23 DIAGNOSIS — S80211A Abrasion, right knee, initial encounter: Secondary | ICD-10-CM | POA: Insufficient documentation

## 2014-10-23 DIAGNOSIS — S20219A Contusion of unspecified front wall of thorax, initial encounter: Secondary | ICD-10-CM | POA: Diagnosis not present

## 2014-10-23 DIAGNOSIS — S301XXA Contusion of abdominal wall, initial encounter: Principal | ICD-10-CM | POA: Insufficient documentation

## 2014-10-23 DIAGNOSIS — M25561 Pain in right knee: Secondary | ICD-10-CM | POA: Diagnosis not present

## 2014-10-23 DIAGNOSIS — S199XXA Unspecified injury of neck, initial encounter: Secondary | ICD-10-CM | POA: Insufficient documentation

## 2014-10-23 DIAGNOSIS — I10 Essential (primary) hypertension: Secondary | ICD-10-CM | POA: Diagnosis not present

## 2014-10-23 DIAGNOSIS — S8991XA Unspecified injury of right lower leg, initial encounter: Secondary | ICD-10-CM | POA: Diagnosis not present

## 2014-10-23 DIAGNOSIS — R1900 Intra-abdominal and pelvic swelling, mass and lump, unspecified site: Secondary | ICD-10-CM | POA: Diagnosis not present

## 2014-10-23 DIAGNOSIS — S0990XA Unspecified injury of head, initial encounter: Secondary | ICD-10-CM | POA: Diagnosis not present

## 2014-10-23 DIAGNOSIS — S3993XA Unspecified injury of pelvis, initial encounter: Secondary | ICD-10-CM | POA: Diagnosis not present

## 2014-10-23 DIAGNOSIS — R1032 Left lower quadrant pain: Secondary | ICD-10-CM | POA: Diagnosis not present

## 2014-10-23 DIAGNOSIS — G319 Degenerative disease of nervous system, unspecified: Secondary | ICD-10-CM | POA: Insufficient documentation

## 2014-10-23 DIAGNOSIS — S8001XA Contusion of right knee, initial encounter: Secondary | ICD-10-CM | POA: Insufficient documentation

## 2014-10-23 DIAGNOSIS — R0602 Shortness of breath: Secondary | ICD-10-CM | POA: Diagnosis not present

## 2014-10-23 DIAGNOSIS — M7989 Other specified soft tissue disorders: Secondary | ICD-10-CM | POA: Diagnosis not present

## 2014-10-23 DIAGNOSIS — Z9049 Acquired absence of other specified parts of digestive tract: Secondary | ICD-10-CM | POA: Insufficient documentation

## 2014-10-23 DIAGNOSIS — R109 Unspecified abdominal pain: Secondary | ICD-10-CM

## 2014-10-23 DIAGNOSIS — R079 Chest pain, unspecified: Secondary | ICD-10-CM | POA: Diagnosis not present

## 2014-10-23 DIAGNOSIS — M47812 Spondylosis without myelopathy or radiculopathy, cervical region: Secondary | ICD-10-CM | POA: Diagnosis not present

## 2014-10-23 DIAGNOSIS — R1031 Right lower quadrant pain: Secondary | ICD-10-CM | POA: Diagnosis not present

## 2014-10-23 HISTORY — DX: Essential (primary) hypertension: I10

## 2014-10-23 HISTORY — DX: Type 2 diabetes mellitus without complications: E11.9

## 2014-10-23 LAB — COMPREHENSIVE METABOLIC PANEL
ALT: 37 U/L (ref 17–63)
AST: 32 U/L (ref 15–41)
Albumin: 3.9 g/dL (ref 3.5–5.0)
Alkaline Phosphatase: 62 U/L (ref 38–126)
Anion gap: 10 (ref 5–15)
BUN: 16 mg/dL (ref 6–20)
CO2: 26 mmol/L (ref 22–32)
Calcium: 8.9 mg/dL (ref 8.9–10.3)
Chloride: 103 mmol/L (ref 101–111)
Creatinine, Ser: 1.07 mg/dL (ref 0.61–1.24)
GFR calc Af Amer: >60 mL/min (ref >60)
GFR calc non Af Amer: >60 mL/min (ref >60)
Glucose, Bld: 204 mg/dL — ABNORMAL HIGH (ref 70–99)
Potassium: 3.8 mmol/L (ref 3.5–5.1)
Sodium: 139 mmol/L (ref 135–145)
Total Bilirubin: 0.7 mg/dL (ref 0.3–1.2)
Total Protein: 6.7 g/dL (ref 6.5–8.1)

## 2014-10-23 LAB — URINALYSIS, ROUTINE W REFLEX MICROSCOPIC
Bilirubin Urine: NEGATIVE
Glucose, UA: NEGATIVE mg/dL
Hgb urine dipstick: NEGATIVE
Ketones, ur: 15 mg/dL — AB
Leukocytes, UA: NEGATIVE
Nitrite: NEGATIVE
Protein, ur: NEGATIVE mg/dL
Specific Gravity, Urine: 1.021 (ref 1.005–1.030)
Urobilinogen, UA: 0.2 mg/dL (ref 0.0–1.0)
pH: 5.5 (ref 5.0–8.0)

## 2014-10-23 LAB — CBC WITH DIFFERENTIAL/PLATELET
Basophils Absolute: 0.1 10*3/uL (ref 0.0–0.1)
Basophils Relative: 1 % (ref 0–1)
Eosinophils Absolute: 0.2 10*3/uL (ref 0.0–0.7)
Eosinophils Relative: 2 % (ref 0–5)
HCT: 47.8 % (ref 39.0–52.0)
Hemoglobin: 16.3 g/dL (ref 13.0–17.0)
Lymphocytes Relative: 18 % (ref 12–46)
Lymphs Abs: 2.8 10*3/uL (ref 0.7–4.0)
MCH: 29.2 pg (ref 26.0–34.0)
MCHC: 34.1 g/dL (ref 30.0–36.0)
MCV: 85.7 fL (ref 78.0–100.0)
Monocytes Absolute: 1.1 10*3/uL — ABNORMAL HIGH (ref 0.1–1.0)
Monocytes Relative: 7 % (ref 3–12)
Neutro Abs: 11.7 10*3/uL — ABNORMAL HIGH (ref 1.7–7.7)
Neutrophils Relative %: 73 % (ref 43–77)
Platelets: 151 10*3/uL (ref 150–400)
RBC: 5.58 MIL/uL (ref 4.22–5.81)
RDW: 14.7 % (ref 11.5–15.5)
WBC: 15.9 10*3/uL — ABNORMAL HIGH (ref 4.0–10.5)

## 2014-10-23 LAB — I-STAT CG4 LACTIC ACID, ED
Lactic Acid, Venous: 1.63 mmol/L (ref 0.5–2.0)
Lactic Acid, Venous: 1.6 mmol/L (ref 0.5–2.0)

## 2014-10-23 LAB — CBG MONITORING, ED: Glucose-Capillary: 196 mg/dL — ABNORMAL HIGH (ref 70–99)

## 2014-10-23 LAB — PROTIME-INR
INR: 1.04 (ref 0.00–1.49)
Prothrombin Time: 13.7 seconds (ref 11.6–15.2)

## 2014-10-23 LAB — ETHANOL: Alcohol, Ethyl (B): <5 mg/dL (ref <5)

## 2014-10-23 MED ORDER — FENTANYL CITRATE (PF) 100 MCG/2ML IJ SOLN
50.0000 ug | INTRAMUSCULAR | Status: AC | PRN
  Administered 2014-10-23 (×3): 50 ug via INTRAVENOUS
  Filled 2014-10-23 (×3): qty 2

## 2014-10-23 MED ORDER — SODIUM CHLORIDE 0.9 % IV BOLUS (SEPSIS)
500.0000 mL | Freq: Once | INTRAVENOUS | Status: AC
  Administered 2014-10-23: 500 mL via INTRAVENOUS

## 2014-10-23 MED ORDER — ONDANSETRON HCL 4 MG PO TABS: 4.0000 mg | ORAL_TABLET | Freq: Four times a day (QID) | ORAL | Status: DC | PRN

## 2014-10-23 MED ORDER — IOHEXOL 300 MG/ML  SOLN
100.0000 mL | Freq: Once | INTRAMUSCULAR | Status: AC | PRN
  Administered 2014-10-23: 100 mL via INTRAVENOUS

## 2014-10-23 MED ORDER — BACITRACIN 500 UNIT/GM EX OINT
1.0000 "application " | TOPICAL_OINTMENT | Freq: Two times a day (BID) | CUTANEOUS | Status: DC
  Administered 2014-10-23: 1 via TOPICAL
  Filled 2014-10-23 (×3): qty 0.9

## 2014-10-23 MED ORDER — ONDANSETRON HCL 4 MG/2ML IJ SOLN: 4.0000 mg | Freq: Four times a day (QID) | INTRAMUSCULAR | Status: DC | PRN

## 2014-10-23 MED ORDER — PANTOPRAZOLE SODIUM 40 MG IV SOLR
40.0000 mg | Freq: Every day | INTRAVENOUS | Status: DC
  Administered 2014-10-23: 40 mg via INTRAVENOUS
  Filled 2014-10-23: qty 40

## 2014-10-23 MED ORDER — POTASSIUM CHLORIDE IN NACL 20-0.9 MEQ/L-% IV SOLN
INTRAVENOUS | Status: DC
  Administered 2014-10-23: 23:00:00 via INTRAVENOUS
  Filled 2014-10-23 (×2): qty 1000

## 2014-10-23 MED ORDER — PANTOPRAZOLE SODIUM 40 MG PO TBEC
40.0000 mg | DELAYED_RELEASE_TABLET | Freq: Every day | ORAL | Status: DC
Start: 2014-10-23 — End: 2014-10-24

## 2014-10-23 MED ORDER — MORPHINE SULFATE 2 MG/ML IJ SOLN
2.0000 mg | INTRAMUSCULAR | Status: DC | PRN
  Administered 2014-10-23: 2 mg via INTRAVENOUS
  Filled 2014-10-23: qty 1

## 2014-10-23 MED ORDER — TETANUS-DIPHTHERIA TOXOIDS TD 5-2 LFU IM INJ
0.5000 mL | INJECTION | Freq: Once | INTRAMUSCULAR | Status: AC
  Administered 2014-10-23: 0.5 mL via INTRAMUSCULAR
  Filled 2014-10-23: qty 0.5

## 2014-10-23 MED ORDER — ONDANSETRON HCL 4 MG/2ML IJ SOLN
4.0000 mg | Freq: Once | INTRAMUSCULAR | Status: AC
  Administered 2014-10-23: 4 mg via INTRAVENOUS
  Filled 2014-10-23: qty 2

## 2014-10-23 NOTE — Progress Notes (Signed)
Orthopedic Tech Progress Note Patient Details:  Dean Dixon 12/18/1950 [id number] Delivered abdominal binder to pt.'s nurse. Patient ID: Dean Dixon, male   DOB: [birth year], 64 y.o.   MRN: [id number]   [name] 10/23/2014, 8:10 PM
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
## 2014-10-23 NOTE — ED Notes (Signed)
Pt in CT.

## 2014-10-23 NOTE — ED Notes (Signed)
pt oxygen saturation decreased to 88% with good waveform on monitor, pt placed on 2L nasal cannula and oxygen sats increased to 94%
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
## 2014-10-23 NOTE — ED Notes (Signed)
Pt transported to CT ?

## 2014-10-24 ENCOUNTER — Encounter (HOSPITAL_COMMUNITY): Payer: Self-pay | Admitting: General Practice

## 2014-10-24 DIAGNOSIS — S20219A Contusion of unspecified front wall of thorax, initial encounter: Secondary | ICD-10-CM | POA: Diagnosis not present

## 2014-10-24 DIAGNOSIS — I10 Essential (primary) hypertension: Secondary | ICD-10-CM | POA: Insufficient documentation

## 2014-10-24 DIAGNOSIS — S301XXA Contusion of abdominal wall, initial encounter: Secondary | ICD-10-CM | POA: Diagnosis not present

## 2014-10-24 LAB — COMPREHENSIVE METABOLIC PANEL
ALT: 32 U/L (ref 17–63)
AST: 33 U/L (ref 15–41)
Albumin: 3.4 g/dL — ABNORMAL LOW (ref 3.5–5.0)
Alkaline Phosphatase: 51 U/L (ref 38–126)
Anion gap: 9 (ref 5–15)
BUN: 21 mg/dL — ABNORMAL HIGH (ref 6–20)
CO2: 25 mmol/L (ref 22–32)
Calcium: 8.5 mg/dL — ABNORMAL LOW (ref 8.9–10.3)
Chloride: 105 mmol/L (ref 101–111)
Creatinine, Ser: 1.13 mg/dL (ref 0.61–1.24)
GFR calc Af Amer: >60 mL/min (ref >60)
GFR calc non Af Amer: >60 mL/min (ref >60)
Glucose, Bld: 205 mg/dL — ABNORMAL HIGH (ref 70–99)
Potassium: 4.4 mmol/L (ref 3.5–5.1)
Sodium: 139 mmol/L (ref 135–145)
Total Bilirubin: 0.5 mg/dL (ref 0.3–1.2)
Total Protein: 6.3 g/dL — ABNORMAL LOW (ref 6.5–8.1)

## 2014-10-24 LAB — CBC
HCT: 41.1 % (ref 39.0–52.0)
HCT: 40.3 % (ref 39.0–52.0)
Hemoglobin: 13.7 g/dL (ref 13.0–17.0)
Hemoglobin: 13.2 g/dL (ref 13.0–17.0)
MCH: 28.6 pg (ref 26.0–34.0)
MCH: 28.6 pg (ref 26.0–34.0)
MCHC: 33.3 g/dL (ref 30.0–36.0)
MCHC: 32.8 g/dL (ref 30.0–36.0)
MCV: 85.8 fL (ref 78.0–100.0)
MCV: 87.2 fL (ref 78.0–100.0)
Platelets: 160 10*3/uL (ref 150–400)
Platelets: 143 10*3/uL — ABNORMAL LOW (ref 150–400)
RBC: 4.79 MIL/uL (ref 4.22–5.81)
RBC: 4.62 MIL/uL (ref 4.22–5.81)
RDW: 15.1 % (ref 11.5–15.5)
RDW: 15.3 % (ref 11.5–15.5)
WBC: 14.4 10*3/uL — ABNORMAL HIGH (ref 4.0–10.5)
WBC: 12.3 10*3/uL — ABNORMAL HIGH (ref 4.0–10.5)

## 2014-10-24 MED ORDER — CHLORHEXIDINE GLUCONATE 0.12 % MT SOLN
15.0000 mL | Freq: Two times a day (BID) | OROMUCOSAL | Status: DC
  Administered 2014-10-24: 15 mL via OROMUCOSAL
  Filled 2014-10-24: qty 15

## 2014-10-24 MED ORDER — MORPHINE SULFATE 2 MG/ML IJ SOLN: 2.0000 mg | INTRAMUSCULAR | Status: DC | PRN

## 2014-10-24 MED ORDER — CETYLPYRIDINIUM CHLORIDE 0.05 % MT LIQD
7.0000 mL | Freq: Two times a day (BID) | OROMUCOSAL | Status: DC
  Administered 2014-10-24: 7 mL via OROMUCOSAL

## 2014-10-24 MED ORDER — BACITRACIN ZINC 500 UNIT/GM EX OINT
TOPICAL_OINTMENT | Freq: Two times a day (BID) | CUTANEOUS | Status: DC
  Administered 2014-10-24: 11:00:00 via TOPICAL
  Filled 2014-10-24: qty 56.7

## 2014-10-24 MED ORDER — HYDROCODONE-ACETAMINOPHEN 10-325 MG PO TABS
0.5000 | ORAL_TABLET | ORAL | Status: DC | PRN
  Administered 2014-10-24: 2 via ORAL
  Filled 2014-10-24: qty 2

## 2014-10-24 MED ORDER — HYDROCODONE-ACETAMINOPHEN 10-325 MG PO TABS: 1.0000 | ORAL_TABLET | ORAL | Status: DC | PRN

## 2014-10-24 NOTE — Progress Notes (Signed)
Discussed discharge summary with patient. Reviewed all medications with patient. Patient received Rx. Patient ready for discharge. 
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
## 2014-10-24 NOTE — Progress Notes (Signed)
Inpatient Diabetes Program Recommendations  AACE/ADA: New Consensus Statement on Inpatient Glycemic Control (2013)  Target Ranges:  Prepandial:   less than 140 mg/dL      Peak postprandial:   less than 180 mg/dL (1-2 hours)      Critically ill patients:  140 - 180 mg/dL   Reason for Assessment: Hyperglycemia   Results for WILKES, POTVIN (MRN [id number]) as of 10/24/2014 14:04  Ref. Range 10/23/2014 16:16 10/24/2014 04:40  Glucose Latest Ref Range: 70-99 mg/dL 204 (H) 205 (H)  Results for MACEO, HERNAN (MRN [id number]) as of 10/24/2014 14:04  Ref. Range 10/23/2014 19:18  Glucose-Capillary Latest Ref Range: 70-99 mg/dL 196 (H)   No noted hx DM. Blood sugars > 200 mg/dL.  Consider HgbA1C to assess glycemic control in past 2-3 months.  Thank you. Lorenda Peck, RD, LDN, CDE Inpatient Diabetes Coordinator [phone]

## 2014-10-24 NOTE — Discharge Instructions (Signed)
No driving while taking hydrocodone.

## 2014-11-09 ENCOUNTER — Other Ambulatory Visit: Payer: Self-pay | Admitting: Family Medicine

## 2014-11-09 DIAGNOSIS — K769 Liver disease, unspecified: Secondary | ICD-10-CM

## 2014-11-09 DIAGNOSIS — S80211D Abrasion, right knee, subsequent encounter: Secondary | ICD-10-CM | POA: Diagnosis not present

## 2014-11-09 DIAGNOSIS — Z1389 Encounter for screening for other disorder: Secondary | ICD-10-CM | POA: Diagnosis not present

## 2014-11-09 DIAGNOSIS — Z6839 Body mass index (BMI) 39.0-39.9, adult: Secondary | ICD-10-CM | POA: Diagnosis not present

## 2014-11-09 DIAGNOSIS — S301XXD Contusion of abdominal wall, subsequent encounter: Secondary | ICD-10-CM | POA: Diagnosis not present

## 2014-11-09 DIAGNOSIS — D72829 Elevated white blood cell count, unspecified: Secondary | ICD-10-CM | POA: Diagnosis not present

## 2014-11-21 ENCOUNTER — Ambulatory Visit
Admission: RE | Admit: 2014-11-21 | Discharge: 2014-11-21 | Disposition: A | Discharge status: Home / Self Care | Payer: Medicare Other | Source: Ambulatory Visit | Attending: Family Medicine | Admitting: Family Medicine

## 2014-11-21 ENCOUNTER — Other Ambulatory Visit: Payer: Self-pay | Admitting: Family Medicine

## 2014-11-21 DIAGNOSIS — K769 Liver disease, unspecified: Secondary | ICD-10-CM

## 2014-11-30 ENCOUNTER — Other Ambulatory Visit (HOSPITAL_COMMUNITY): Payer: Self-pay | Admitting: Trauma Surgery

## 2014-11-30 ENCOUNTER — Other Ambulatory Visit (HOSPITAL_COMMUNITY): Payer: Self-pay | Admitting: Family Medicine

## 2014-11-30 DIAGNOSIS — K769 Liver disease, unspecified: Secondary | ICD-10-CM

## 2014-11-30 DIAGNOSIS — R9389 Abnormal findings on diagnostic imaging of other specified body structures: Secondary | ICD-10-CM

## 2014-12-15 ENCOUNTER — Ambulatory Visit (HOSPITAL_COMMUNITY)
Admission: RE | Admit: 2014-12-15 | Discharge: 2014-12-15 | Disposition: A | Discharge status: Home / Self Care | Payer: Medicare Other | Source: Ambulatory Visit | Attending: Trauma Surgery | Admitting: Trauma Surgery

## 2014-12-15 DIAGNOSIS — N281 Cyst of kidney, acquired: Secondary | ICD-10-CM | POA: Diagnosis not present

## 2014-12-15 DIAGNOSIS — K769 Liver disease, unspecified: Secondary | ICD-10-CM | POA: Insufficient documentation

## 2014-12-15 DIAGNOSIS — R9389 Abnormal findings on diagnostic imaging of other specified body structures: Secondary | ICD-10-CM

## 2014-12-15 LAB — CREATININE, SERUM
Creatinine, Ser: 0.87 mg/dL (ref 0.61–1.24)
GFR calc Af Amer: >60 mL/min (ref >60)
GFR calc non Af Amer: >60 mL/min (ref >60)

## 2014-12-15 MED ORDER — GADOBENATE DIMEGLUMINE 529 MG/ML IV SOLN
20.0000 mL | Freq: Once | INTRAVENOUS | Status: AC | PRN
  Administered 2014-12-15: 20 mL via INTRAVENOUS

## 2015-01-15 DIAGNOSIS — E291 Testicular hypofunction: Secondary | ICD-10-CM | POA: Diagnosis not present

## 2015-01-15 DIAGNOSIS — E1165 Type 2 diabetes mellitus with hyperglycemia: Secondary | ICD-10-CM | POA: Diagnosis not present

## 2015-01-15 DIAGNOSIS — M546 Pain in thoracic spine: Secondary | ICD-10-CM | POA: Diagnosis not present

## 2015-01-15 DIAGNOSIS — I10 Essential (primary) hypertension: Secondary | ICD-10-CM | POA: Diagnosis not present

## 2015-01-15 DIAGNOSIS — Z6838 Body mass index (BMI) 38.0-38.9, adult: Secondary | ICD-10-CM | POA: Diagnosis not present

## 2015-01-30 DIAGNOSIS — E291 Testicular hypofunction: Secondary | ICD-10-CM | POA: Diagnosis not present

## 2015-02-16 DIAGNOSIS — E291 Testicular hypofunction: Secondary | ICD-10-CM | POA: Diagnosis not present

## 2015-02-20 DIAGNOSIS — E291 Testicular hypofunction: Secondary | ICD-10-CM | POA: Diagnosis not present

## 2015-02-20 DIAGNOSIS — Z79899 Other long term (current) drug therapy: Secondary | ICD-10-CM | POA: Diagnosis not present

## 2015-02-20 DIAGNOSIS — Z125 Encounter for screening for malignant neoplasm of prostate: Secondary | ICD-10-CM | POA: Diagnosis not present

## 2015-03-07 DIAGNOSIS — E291 Testicular hypofunction: Secondary | ICD-10-CM | POA: Diagnosis not present

## 2015-03-27 DIAGNOSIS — Z23 Encounter for immunization: Secondary | ICD-10-CM | POA: Diagnosis not present

## 2015-03-27 DIAGNOSIS — E291 Testicular hypofunction: Secondary | ICD-10-CM | POA: Diagnosis not present

## 2015-04-10 DIAGNOSIS — E291 Testicular hypofunction: Secondary | ICD-10-CM | POA: Diagnosis not present

## 2015-04-25 DIAGNOSIS — E291 Testicular hypofunction: Secondary | ICD-10-CM | POA: Diagnosis not present

## 2015-05-08 DIAGNOSIS — E291 Testicular hypofunction: Secondary | ICD-10-CM | POA: Diagnosis not present

## 2015-05-22 DIAGNOSIS — E291 Testicular hypofunction: Secondary | ICD-10-CM | POA: Diagnosis not present

## 2015-05-24 DIAGNOSIS — I10 Essential (primary) hypertension: Secondary | ICD-10-CM | POA: Diagnosis not present

## 2015-05-24 DIAGNOSIS — Z6841 Body Mass Index (BMI) 40.0 and over, adult: Secondary | ICD-10-CM | POA: Diagnosis not present

## 2015-05-24 DIAGNOSIS — Z1389 Encounter for screening for other disorder: Secondary | ICD-10-CM | POA: Diagnosis not present

## 2015-05-24 DIAGNOSIS — E291 Testicular hypofunction: Secondary | ICD-10-CM | POA: Diagnosis not present

## 2015-05-24 DIAGNOSIS — Z79899 Other long term (current) drug therapy: Secondary | ICD-10-CM | POA: Diagnosis not present

## 2015-05-24 DIAGNOSIS — Z9181 History of falling: Secondary | ICD-10-CM | POA: Diagnosis not present

## 2015-05-24 DIAGNOSIS — E782 Mixed hyperlipidemia: Secondary | ICD-10-CM | POA: Diagnosis not present

## 2015-05-24 DIAGNOSIS — E1165 Type 2 diabetes mellitus with hyperglycemia: Secondary | ICD-10-CM | POA: Diagnosis not present

## 2015-06-05 DIAGNOSIS — E291 Testicular hypofunction: Secondary | ICD-10-CM | POA: Diagnosis not present

## 2015-06-19 DIAGNOSIS — E291 Testicular hypofunction: Secondary | ICD-10-CM | POA: Diagnosis not present

## 2015-07-03 DIAGNOSIS — E291 Testicular hypofunction: Secondary | ICD-10-CM | POA: Diagnosis not present

## 2015-07-17 DIAGNOSIS — E291 Testicular hypofunction: Secondary | ICD-10-CM | POA: Diagnosis not present

## 2015-07-31 DIAGNOSIS — E291 Testicular hypofunction: Secondary | ICD-10-CM | POA: Diagnosis not present

## 2015-09-24 DIAGNOSIS — E291 Testicular hypofunction: Secondary | ICD-10-CM | POA: Diagnosis not present

## 2015-09-24 DIAGNOSIS — E782 Mixed hyperlipidemia: Secondary | ICD-10-CM | POA: Diagnosis not present

## 2015-09-24 DIAGNOSIS — Z6839 Body mass index (BMI) 39.0-39.9, adult: Secondary | ICD-10-CM | POA: Diagnosis not present

## 2015-09-24 DIAGNOSIS — E1165 Type 2 diabetes mellitus with hyperglycemia: Secondary | ICD-10-CM | POA: Diagnosis not present

## 2015-09-24 DIAGNOSIS — I10 Essential (primary) hypertension: Secondary | ICD-10-CM | POA: Diagnosis not present

## 2015-09-24 DIAGNOSIS — E669 Obesity, unspecified: Secondary | ICD-10-CM | POA: Diagnosis not present

## 2015-09-24 DIAGNOSIS — Z86010 Personal history of colonic polyps: Secondary | ICD-10-CM | POA: Diagnosis not present

## 2015-10-18 DIAGNOSIS — E119 Type 2 diabetes mellitus without complications: Secondary | ICD-10-CM | POA: Diagnosis not present

## 2015-10-18 DIAGNOSIS — H5203 Hypermetropia, bilateral: Secondary | ICD-10-CM | POA: Diagnosis not present

## 2015-10-18 DIAGNOSIS — H25813 Combined forms of age-related cataract, bilateral: Secondary | ICD-10-CM | POA: Diagnosis not present

## 2015-10-24 DIAGNOSIS — E291 Testicular hypofunction: Secondary | ICD-10-CM | POA: Diagnosis not present

## 2015-11-07 DIAGNOSIS — E291 Testicular hypofunction: Secondary | ICD-10-CM | POA: Diagnosis not present

## 2015-11-30 DIAGNOSIS — E291 Testicular hypofunction: Secondary | ICD-10-CM | POA: Diagnosis not present

## 2015-12-18 DIAGNOSIS — E291 Testicular hypofunction: Secondary | ICD-10-CM | POA: Diagnosis not present

## 2016-01-01 DIAGNOSIS — E291 Testicular hypofunction: Secondary | ICD-10-CM | POA: Diagnosis not present

## 2016-01-22 DIAGNOSIS — E291 Testicular hypofunction: Secondary | ICD-10-CM | POA: Diagnosis not present

## 2016-01-30 DIAGNOSIS — E1165 Type 2 diabetes mellitus with hyperglycemia: Secondary | ICD-10-CM | POA: Diagnosis not present

## 2016-01-30 DIAGNOSIS — Z6841 Body Mass Index (BMI) 40.0 and over, adult: Secondary | ICD-10-CM | POA: Diagnosis not present

## 2016-01-30 DIAGNOSIS — I10 Essential (primary) hypertension: Secondary | ICD-10-CM | POA: Diagnosis not present

## 2016-01-30 DIAGNOSIS — Z79899 Other long term (current) drug therapy: Secondary | ICD-10-CM | POA: Diagnosis not present

## 2016-01-30 DIAGNOSIS — L989 Disorder of the skin and subcutaneous tissue, unspecified: Secondary | ICD-10-CM | POA: Diagnosis not present

## 2016-01-30 DIAGNOSIS — Z125 Encounter for screening for malignant neoplasm of prostate: Secondary | ICD-10-CM | POA: Diagnosis not present

## 2016-01-30 DIAGNOSIS — E291 Testicular hypofunction: Secondary | ICD-10-CM | POA: Diagnosis not present

## 2016-01-30 DIAGNOSIS — E782 Mixed hyperlipidemia: Secondary | ICD-10-CM | POA: Diagnosis not present

## 2016-01-30 DIAGNOSIS — R740 Nonspecific elevation of levels of transaminase and lactic acid dehydrogenase [LDH]: Secondary | ICD-10-CM | POA: Diagnosis not present

## 2016-02-05 DIAGNOSIS — L920 Granuloma annulare: Secondary | ICD-10-CM | POA: Diagnosis not present

## 2016-02-05 DIAGNOSIS — Z23 Encounter for immunization: Secondary | ICD-10-CM | POA: Diagnosis not present

## 2016-02-05 DIAGNOSIS — L989 Disorder of the skin and subcutaneous tissue, unspecified: Secondary | ICD-10-CM | POA: Diagnosis not present

## 2016-02-05 DIAGNOSIS — E291 Testicular hypofunction: Secondary | ICD-10-CM | POA: Diagnosis not present

## 2016-02-19 DIAGNOSIS — E291 Testicular hypofunction: Secondary | ICD-10-CM | POA: Diagnosis not present
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
## 2016-03-26 DIAGNOSIS — E291 Testicular hypofunction: Secondary | ICD-10-CM | POA: Diagnosis not present

## 2016-03-26 DIAGNOSIS — Z23 Encounter for immunization: Secondary | ICD-10-CM | POA: Diagnosis not present

## 2016-04-09 DIAGNOSIS — E291 Testicular hypofunction: Secondary | ICD-10-CM | POA: Diagnosis not present

## 2016-04-23 DIAGNOSIS — E291 Testicular hypofunction: Secondary | ICD-10-CM | POA: Diagnosis not present

## 2016-05-07 DIAGNOSIS — E291 Testicular hypofunction: Secondary | ICD-10-CM | POA: Diagnosis not present

## 2016-05-21 DIAGNOSIS — E291 Testicular hypofunction: Secondary | ICD-10-CM | POA: Diagnosis not present

## 2016-06-04 DIAGNOSIS — E1165 Type 2 diabetes mellitus with hyperglycemia: Secondary | ICD-10-CM | POA: Diagnosis not present

## 2016-06-04 DIAGNOSIS — E782 Mixed hyperlipidemia: Secondary | ICD-10-CM | POA: Diagnosis not present

## 2016-06-04 DIAGNOSIS — Z1389 Encounter for screening for other disorder: Secondary | ICD-10-CM | POA: Diagnosis not present

## 2016-06-04 DIAGNOSIS — Z6841 Body Mass Index (BMI) 40.0 and over, adult: Secondary | ICD-10-CM | POA: Diagnosis not present

## 2016-06-04 DIAGNOSIS — E349 Endocrine disorder, unspecified: Secondary | ICD-10-CM | POA: Diagnosis not present

## 2016-06-04 DIAGNOSIS — Z9181 History of falling: Secondary | ICD-10-CM | POA: Diagnosis not present

## 2016-06-04 DIAGNOSIS — I10 Essential (primary) hypertension: Secondary | ICD-10-CM | POA: Diagnosis not present

## 2016-06-18 DIAGNOSIS — E291 Testicular hypofunction: Secondary | ICD-10-CM | POA: Diagnosis not present

## 2016-10-06 DIAGNOSIS — E782 Mixed hyperlipidemia: Secondary | ICD-10-CM | POA: Diagnosis not present

## 2016-10-06 DIAGNOSIS — E1165 Type 2 diabetes mellitus with hyperglycemia: Secondary | ICD-10-CM | POA: Diagnosis not present

## 2016-10-06 DIAGNOSIS — I10 Essential (primary) hypertension: Secondary | ICD-10-CM | POA: Diagnosis not present

## 2016-10-06 DIAGNOSIS — E291 Testicular hypofunction: Secondary | ICD-10-CM | POA: Diagnosis not present

## 2016-10-06 DIAGNOSIS — Z6841 Body Mass Index (BMI) 40.0 and over, adult: Secondary | ICD-10-CM | POA: Diagnosis not present

## 2016-11-18 DIAGNOSIS — H5203 Hypermetropia, bilateral: Secondary | ICD-10-CM | POA: Diagnosis not present

## 2016-11-18 DIAGNOSIS — H524 Presbyopia: Secondary | ICD-10-CM | POA: Diagnosis not present

## 2016-11-18 DIAGNOSIS — E119 Type 2 diabetes mellitus without complications: Secondary | ICD-10-CM | POA: Diagnosis not present

## 2016-11-18 DIAGNOSIS — H52223 Regular astigmatism, bilateral: Secondary | ICD-10-CM | POA: Diagnosis not present

## 2016-12-30 DIAGNOSIS — Z0000 Encounter for general adult medical examination without abnormal findings: Secondary | ICD-10-CM | POA: Diagnosis not present

## 2016-12-30 DIAGNOSIS — E785 Hyperlipidemia, unspecified: Secondary | ICD-10-CM | POA: Diagnosis not present

## 2016-12-30 DIAGNOSIS — Z6841 Body Mass Index (BMI) 40.0 and over, adult: Secondary | ICD-10-CM | POA: Diagnosis not present

## 2016-12-30 DIAGNOSIS — Z136 Encounter for screening for cardiovascular disorders: Secondary | ICD-10-CM | POA: Diagnosis not present

## 2016-12-30 DIAGNOSIS — Z125 Encounter for screening for malignant neoplasm of prostate: Secondary | ICD-10-CM | POA: Diagnosis not present

## 2016-12-30 DIAGNOSIS — Z9181 History of falling: Secondary | ICD-10-CM | POA: Diagnosis not present

## 2017-02-05 DIAGNOSIS — I10 Essential (primary) hypertension: Secondary | ICD-10-CM | POA: Diagnosis not present

## 2017-02-05 DIAGNOSIS — E1165 Type 2 diabetes mellitus with hyperglycemia: Secondary | ICD-10-CM | POA: Diagnosis not present

## 2017-02-05 DIAGNOSIS — Z6841 Body Mass Index (BMI) 40.0 and over, adult: Secondary | ICD-10-CM | POA: Diagnosis not present

## 2017-02-05 DIAGNOSIS — E782 Mixed hyperlipidemia: Secondary | ICD-10-CM | POA: Diagnosis not present

## 2017-02-05 DIAGNOSIS — Z125 Encounter for screening for malignant neoplasm of prostate: Secondary | ICD-10-CM | POA: Diagnosis not present

## 2017-02-05 DIAGNOSIS — E291 Testicular hypofunction: Secondary | ICD-10-CM | POA: Diagnosis not present

## 2017-02-11 DIAGNOSIS — E291 Testicular hypofunction: Secondary | ICD-10-CM | POA: Diagnosis not present

## 2017-02-25 DIAGNOSIS — E291 Testicular hypofunction: Secondary | ICD-10-CM | POA: Diagnosis not present

## 2017-03-04 DIAGNOSIS — L03032 Cellulitis of left toe: Secondary | ICD-10-CM | POA: Diagnosis not present

## 2017-03-04 DIAGNOSIS — K5732 Diverticulitis of large intestine without perforation or abscess without bleeding: Secondary | ICD-10-CM | POA: Diagnosis not present

## 2017-03-04 DIAGNOSIS — Z6841 Body Mass Index (BMI) 40.0 and over, adult: Secondary | ICD-10-CM | POA: Diagnosis not present

## 2017-03-11 DIAGNOSIS — K5790 Diverticulosis of intestine, part unspecified, without perforation or abscess without bleeding: Secondary | ICD-10-CM | POA: Diagnosis not present

## 2017-03-11 DIAGNOSIS — K760 Fatty (change of) liver, not elsewhere classified: Secondary | ICD-10-CM | POA: Diagnosis not present

## 2017-03-11 DIAGNOSIS — K5730 Diverticulosis of large intestine without perforation or abscess without bleeding: Secondary | ICD-10-CM | POA: Diagnosis not present

## 2017-03-17 DIAGNOSIS — E291 Testicular hypofunction: Secondary | ICD-10-CM | POA: Diagnosis not present

## 2017-03-27 DIAGNOSIS — Z23 Encounter for immunization: Secondary | ICD-10-CM | POA: Diagnosis not present

## 2017-03-27 DIAGNOSIS — R748 Abnormal levels of other serum enzymes: Secondary | ICD-10-CM | POA: Diagnosis not present

## 2017-03-27 DIAGNOSIS — E291 Testicular hypofunction: Secondary | ICD-10-CM | POA: Diagnosis not present

## 2017-04-03 DIAGNOSIS — Z6841 Body Mass Index (BMI) 40.0 and over, adult: Secondary | ICD-10-CM | POA: Diagnosis not present

## 2017-04-03 DIAGNOSIS — L03032 Cellulitis of left toe: Secondary | ICD-10-CM | POA: Diagnosis not present

## 2017-04-03 DIAGNOSIS — S99922S Unspecified injury of left foot, sequela: Secondary | ICD-10-CM | POA: Diagnosis not present

## 2017-04-10 DIAGNOSIS — E291 Testicular hypofunction: Secondary | ICD-10-CM | POA: Diagnosis not present

## 2017-04-17 DIAGNOSIS — Z6841 Body Mass Index (BMI) 40.0 and over, adult: Secondary | ICD-10-CM | POA: Diagnosis not present

## 2017-04-17 DIAGNOSIS — L03032 Cellulitis of left toe: Secondary | ICD-10-CM | POA: Diagnosis not present

## 2017-04-17 DIAGNOSIS — S99922S Unspecified injury of left foot, sequela: Secondary | ICD-10-CM | POA: Diagnosis not present

## 2017-05-28 DIAGNOSIS — E1165 Type 2 diabetes mellitus with hyperglycemia: Secondary | ICD-10-CM | POA: Diagnosis not present

## 2017-05-28 DIAGNOSIS — Z6841 Body Mass Index (BMI) 40.0 and over, adult: Secondary | ICD-10-CM | POA: Diagnosis not present

## 2017-05-28 DIAGNOSIS — L03032 Cellulitis of left toe: Secondary | ICD-10-CM | POA: Diagnosis not present

## 2017-06-02 DIAGNOSIS — E291 Testicular hypofunction: Secondary | ICD-10-CM | POA: Diagnosis not present

## 2017-06-30 DIAGNOSIS — E349 Endocrine disorder, unspecified: Secondary | ICD-10-CM | POA: Diagnosis not present

## 2017-06-30 DIAGNOSIS — K5732 Diverticulitis of large intestine without perforation or abscess without bleeding: Secondary | ICD-10-CM | POA: Diagnosis not present

## 2017-06-30 DIAGNOSIS — Z6841 Body Mass Index (BMI) 40.0 and over, adult: Secondary | ICD-10-CM | POA: Diagnosis not present

## 2017-07-14 DIAGNOSIS — E291 Testicular hypofunction: Secondary | ICD-10-CM | POA: Diagnosis not present

## 2017-07-21 DIAGNOSIS — E291 Testicular hypofunction: Secondary | ICD-10-CM | POA: Diagnosis not present

## 2017-07-28 DIAGNOSIS — E291 Testicular hypofunction: Secondary | ICD-10-CM | POA: Diagnosis not present

## 2017-08-12 DIAGNOSIS — E291 Testicular hypofunction: Secondary | ICD-10-CM | POA: Diagnosis not present

## 2017-08-25 DIAGNOSIS — E291 Testicular hypofunction: Secondary | ICD-10-CM | POA: Diagnosis not present

## 2017-09-08 DIAGNOSIS — E291 Testicular hypofunction: Secondary | ICD-10-CM | POA: Diagnosis not present

## 2017-09-22 DIAGNOSIS — E291 Testicular hypofunction: Secondary | ICD-10-CM | POA: Diagnosis not present

## 2017-10-06 DIAGNOSIS — Z1331 Encounter for screening for depression: Secondary | ICD-10-CM | POA: Diagnosis not present

## 2017-10-06 DIAGNOSIS — E119 Type 2 diabetes mellitus without complications: Secondary | ICD-10-CM | POA: Diagnosis not present

## 2017-10-06 DIAGNOSIS — I10 Essential (primary) hypertension: Secondary | ICD-10-CM | POA: Diagnosis not present

## 2017-10-06 DIAGNOSIS — Z79899 Other long term (current) drug therapy: Secondary | ICD-10-CM | POA: Diagnosis not present

## 2017-10-06 DIAGNOSIS — E782 Mixed hyperlipidemia: Secondary | ICD-10-CM | POA: Diagnosis not present

## 2017-10-06 DIAGNOSIS — Z6841 Body Mass Index (BMI) 40.0 and over, adult: Secondary | ICD-10-CM | POA: Diagnosis not present

## 2017-10-06 DIAGNOSIS — E291 Testicular hypofunction: Secondary | ICD-10-CM | POA: Diagnosis not present

## 2017-10-21 DIAGNOSIS — E291 Testicular hypofunction: Secondary | ICD-10-CM | POA: Diagnosis not present

## 2017-12-18 DIAGNOSIS — Z6841 Body Mass Index (BMI) 40.0 and over, adult: Secondary | ICD-10-CM | POA: Diagnosis not present

## 2017-12-18 DIAGNOSIS — K529 Noninfective gastroenteritis and colitis, unspecified: Secondary | ICD-10-CM | POA: Diagnosis not present

## 2018-01-01 DIAGNOSIS — Z1331 Encounter for screening for depression: Secondary | ICD-10-CM | POA: Diagnosis not present

## 2018-01-01 DIAGNOSIS — Z0000 Encounter for general adult medical examination without abnormal findings: Secondary | ICD-10-CM | POA: Diagnosis not present

## 2018-01-01 DIAGNOSIS — Z125 Encounter for screening for malignant neoplasm of prostate: Secondary | ICD-10-CM | POA: Diagnosis not present

## 2018-01-01 DIAGNOSIS — Z9181 History of falling: Secondary | ICD-10-CM | POA: Diagnosis not present

## 2018-01-01 DIAGNOSIS — Z1211 Encounter for screening for malignant neoplasm of colon: Secondary | ICD-10-CM | POA: Diagnosis not present

## 2018-01-01 DIAGNOSIS — E785 Hyperlipidemia, unspecified: Secondary | ICD-10-CM | POA: Diagnosis not present

## 2018-01-01 DIAGNOSIS — E669 Obesity, unspecified: Secondary | ICD-10-CM | POA: Diagnosis not present

## 2018-01-01 DIAGNOSIS — Z136 Encounter for screening for cardiovascular disorders: Secondary | ICD-10-CM | POA: Diagnosis not present

## 2018-01-01 DIAGNOSIS — Z139 Encounter for screening, unspecified: Secondary | ICD-10-CM | POA: Diagnosis not present

## 2018-01-01 DIAGNOSIS — Z6841 Body Mass Index (BMI) 40.0 and over, adult: Secondary | ICD-10-CM | POA: Diagnosis not present

## 2018-01-26 DIAGNOSIS — I10 Essential (primary) hypertension: Secondary | ICD-10-CM | POA: Diagnosis not present

## 2018-01-26 DIAGNOSIS — E1165 Type 2 diabetes mellitus with hyperglycemia: Secondary | ICD-10-CM | POA: Diagnosis not present

## 2018-01-26 DIAGNOSIS — E782 Mixed hyperlipidemia: Secondary | ICD-10-CM | POA: Diagnosis not present

## 2018-01-26 DIAGNOSIS — Z6841 Body Mass Index (BMI) 40.0 and over, adult: Secondary | ICD-10-CM | POA: Diagnosis not present

## 2018-01-29 DIAGNOSIS — H5203 Hypermetropia, bilateral: Secondary | ICD-10-CM | POA: Diagnosis not present

## 2018-01-29 DIAGNOSIS — H52223 Regular astigmatism, bilateral: Secondary | ICD-10-CM | POA: Diagnosis not present

## 2018-01-29 DIAGNOSIS — Z7984 Long term (current) use of oral hypoglycemic drugs: Secondary | ICD-10-CM | POA: Diagnosis not present

## 2018-01-29 DIAGNOSIS — E119 Type 2 diabetes mellitus without complications: Secondary | ICD-10-CM | POA: Diagnosis not present

## 2018-01-29 DIAGNOSIS — H524 Presbyopia: Secondary | ICD-10-CM | POA: Diagnosis not present

## 2018-02-25 DIAGNOSIS — H25813 Combined forms of age-related cataract, bilateral: Secondary | ICD-10-CM | POA: Diagnosis not present

## 2018-03-24 DIAGNOSIS — E1165 Type 2 diabetes mellitus with hyperglycemia: Secondary | ICD-10-CM | POA: Diagnosis not present

## 2018-03-24 DIAGNOSIS — Z23 Encounter for immunization: Secondary | ICD-10-CM | POA: Diagnosis not present

## 2018-03-24 DIAGNOSIS — Z1339 Encounter for screening examination for other mental health and behavioral disorders: Secondary | ICD-10-CM | POA: Diagnosis not present

## 2018-03-24 DIAGNOSIS — H269 Unspecified cataract: Secondary | ICD-10-CM | POA: Diagnosis not present

## 2018-03-24 DIAGNOSIS — Z01818 Encounter for other preprocedural examination: Secondary | ICD-10-CM | POA: Diagnosis not present

## 2018-04-15 DIAGNOSIS — Z91041 Radiographic dye allergy status: Secondary | ICD-10-CM | POA: Diagnosis not present

## 2018-04-15 DIAGNOSIS — Z79899 Other long term (current) drug therapy: Secondary | ICD-10-CM | POA: Diagnosis not present

## 2018-04-15 DIAGNOSIS — E1136 Type 2 diabetes mellitus with diabetic cataract: Secondary | ICD-10-CM | POA: Diagnosis not present

## 2018-04-15 DIAGNOSIS — I10 Essential (primary) hypertension: Secondary | ICD-10-CM | POA: Diagnosis not present

## 2018-04-15 DIAGNOSIS — H269 Unspecified cataract: Secondary | ICD-10-CM | POA: Diagnosis not present

## 2018-04-15 DIAGNOSIS — H25811 Combined forms of age-related cataract, right eye: Secondary | ICD-10-CM | POA: Diagnosis not present

## 2018-04-15 DIAGNOSIS — H52223 Regular astigmatism, bilateral: Secondary | ICD-10-CM | POA: Diagnosis not present

## 2018-04-15 DIAGNOSIS — E669 Obesity, unspecified: Secondary | ICD-10-CM | POA: Diagnosis not present

## 2018-04-15 DIAGNOSIS — H5202 Hypermetropia, left eye: Secondary | ICD-10-CM | POA: Diagnosis not present

## 2018-04-28 DIAGNOSIS — E785 Hyperlipidemia, unspecified: Secondary | ICD-10-CM | POA: Diagnosis not present

## 2018-04-28 DIAGNOSIS — I10 Essential (primary) hypertension: Secondary | ICD-10-CM | POA: Diagnosis not present

## 2018-04-28 DIAGNOSIS — E1165 Type 2 diabetes mellitus with hyperglycemia: Secondary | ICD-10-CM | POA: Diagnosis not present

## 2018-08-06 DIAGNOSIS — E785 Hyperlipidemia, unspecified: Secondary | ICD-10-CM | POA: Diagnosis not present

## 2018-08-06 DIAGNOSIS — I10 Essential (primary) hypertension: Secondary | ICD-10-CM | POA: Diagnosis not present

## 2018-08-06 DIAGNOSIS — E1165 Type 2 diabetes mellitus with hyperglycemia: Secondary | ICD-10-CM | POA: Diagnosis not present

## 2018-08-06 DIAGNOSIS — Z6841 Body Mass Index (BMI) 40.0 and over, adult: Secondary | ICD-10-CM | POA: Diagnosis not present

## 2018-11-02 DIAGNOSIS — E785 Hyperlipidemia, unspecified: Secondary | ICD-10-CM | POA: Diagnosis not present

## 2018-11-02 DIAGNOSIS — D751 Secondary polycythemia: Secondary | ICD-10-CM | POA: Diagnosis not present

## 2018-11-02 DIAGNOSIS — E1165 Type 2 diabetes mellitus with hyperglycemia: Secondary | ICD-10-CM | POA: Diagnosis not present

## 2018-11-04 DIAGNOSIS — E1165 Type 2 diabetes mellitus with hyperglycemia: Secondary | ICD-10-CM | POA: Diagnosis not present

## 2018-11-04 DIAGNOSIS — I10 Essential (primary) hypertension: Secondary | ICD-10-CM | POA: Diagnosis not present

## 2018-11-04 DIAGNOSIS — Z6841 Body Mass Index (BMI) 40.0 and over, adult: Secondary | ICD-10-CM | POA: Diagnosis not present

## 2019-03-24 DIAGNOSIS — Z23 Encounter for immunization: Secondary | ICD-10-CM | POA: Diagnosis not present

## 2019-04-11 DIAGNOSIS — I10 Essential (primary) hypertension: Secondary | ICD-10-CM | POA: Diagnosis not present

## 2019-04-11 DIAGNOSIS — E1165 Type 2 diabetes mellitus with hyperglycemia: Secondary | ICD-10-CM | POA: Diagnosis not present

## 2019-04-11 DIAGNOSIS — Z1331 Encounter for screening for depression: Secondary | ICD-10-CM | POA: Diagnosis not present

## 2019-04-11 DIAGNOSIS — Z125 Encounter for screening for malignant neoplasm of prostate: Secondary | ICD-10-CM | POA: Diagnosis not present

## 2019-04-11 DIAGNOSIS — D72829 Elevated white blood cell count, unspecified: Secondary | ICD-10-CM | POA: Diagnosis not present

## 2019-04-11 DIAGNOSIS — E782 Mixed hyperlipidemia: Secondary | ICD-10-CM | POA: Diagnosis not present

## 2019-04-26 ENCOUNTER — Telehealth: Payer: Self-pay | Admitting: Hematology

## 2019-04-26 ENCOUNTER — Inpatient Hospital Stay: Payer: Medicare Other | Attending: Hematology | Admitting: Hematology

## 2019-04-26 ENCOUNTER — Other Ambulatory Visit: Payer: Self-pay

## 2019-04-26 ENCOUNTER — Inpatient Hospital Stay: Payer: Medicare Other

## 2019-04-26 VITALS — BP 158/63 | HR 83 | Temp 98.2°F | Resp 18 | Ht 64.02 in | Wt 239.4 lb

## 2019-04-26 DIAGNOSIS — Z79899 Other long term (current) drug therapy: Secondary | ICD-10-CM | POA: Diagnosis not present

## 2019-04-26 DIAGNOSIS — E119 Type 2 diabetes mellitus without complications: Secondary | ICD-10-CM | POA: Insufficient documentation

## 2019-04-26 DIAGNOSIS — I10 Essential (primary) hypertension: Secondary | ICD-10-CM | POA: Insufficient documentation

## 2019-04-26 DIAGNOSIS — R634 Abnormal weight loss: Secondary | ICD-10-CM | POA: Diagnosis not present

## 2019-04-26 DIAGNOSIS — D72820 Lymphocytosis (symptomatic): Secondary | ICD-10-CM

## 2019-04-26 DIAGNOSIS — M2550 Pain in unspecified joint: Secondary | ICD-10-CM | POA: Diagnosis not present

## 2019-04-26 LAB — CBC WITH DIFFERENTIAL/PLATELET
Abs Immature Granulocytes: 0.35 10*3/uL — ABNORMAL HIGH (ref 0.00–0.07)
Basophils Absolute: 0.2 10*3/uL — ABNORMAL HIGH (ref 0.0–0.1)
Basophils Relative: 1 %
Eosinophils Absolute: 0.5 10*3/uL (ref 0.0–0.5)
Eosinophils Relative: 2 %
HCT: 39.6 % (ref 39.0–52.0)
Hemoglobin: 13 g/dL (ref 13.0–17.0)
Immature Granulocytes: 1 %
Lymphocytes Relative: 60 %
Lymphs Abs: 17.1 10*3/uL — ABNORMAL HIGH (ref 0.7–4.0)
MCH: 29.2 pg (ref 26.0–34.0)
MCHC: 32.8 g/dL (ref 30.0–36.0)
MCV: 89 fL (ref 80.0–100.0)
Monocytes Absolute: 3.4 10*3/uL — ABNORMAL HIGH (ref 0.1–1.0)
Monocytes Relative: 12 %
Neutro Abs: 6.7 10*3/uL (ref 1.7–7.7)
Neutrophils Relative %: 24 %
Platelets: 153 10*3/uL (ref 150–400)
RBC: 4.45 MIL/uL (ref 4.22–5.81)
RDW: 15.5 % (ref 11.5–15.5)
WBC: 28.2 10*3/uL — ABNORMAL HIGH (ref 4.0–10.5)
nRBC: 0 % (ref 0.0–0.2)

## 2019-04-26 LAB — CMP (CANCER CENTER ONLY)
ALT: 29 U/L (ref 0–44)
AST: 21 U/L (ref 15–41)
Albumin: 4.2 g/dL (ref 3.5–5.0)
Alkaline Phosphatase: 91 U/L (ref 38–126)
Anion gap: 12 (ref 5–15)
BUN: 13 mg/dL (ref 8–23)
CO2: 27 mmol/L (ref 22–32)
Calcium: 9 mg/dL (ref 8.9–10.3)
Chloride: 103 mmol/L (ref 98–111)
Creatinine: 0.98 mg/dL (ref 0.61–1.24)
GFR, Est AFR Am: >60 mL/min (ref >60)
GFR, Estimated: >60 mL/min (ref >60)
Glucose, Bld: 224 mg/dL — ABNORMAL HIGH (ref 70–99)
Potassium: 4.1 mmol/L (ref 3.5–5.1)
Sodium: 142 mmol/L (ref 135–145)
Total Bilirubin: 0.4 mg/dL (ref 0.3–1.2)
Total Protein: 7.4 g/dL (ref 6.5–8.1)

## 2019-04-26 LAB — HEPATITIS B SURFACE ANTIGEN: Hepatitis B Surface Ag: NONREACTIVE

## 2019-04-26 LAB — HEPATITIS B CORE ANTIBODY, TOTAL: Hep B Core Total Ab: NONREACTIVE

## 2019-04-26 LAB — HEPATITIS C ANTIBODY: HCV Ab: NONREACTIVE

## 2019-04-26 LAB — LACTATE DEHYDROGENASE: LDH: 174 U/L (ref 98–192)

## 2019-04-26 NOTE — Telephone Encounter (Signed)
Schedule per 11/03 los, checked patient in for labs.

## 2019-04-26 NOTE — Patient Instructions (Signed)
Thank you for choosing [facility] to provide your oncology and hematology care.   Should you have questions after your visit to the [facility] ([facility]), please contact this office at [phone] between 8:30 AM and 4:30 PM.  Voice mails left after 4:00 PM may not be returned until the following business day.  Calls received after 4:30 PM will be answered by an off-site Nurse Triage Line.    Prescription Refills:  Please have your pharmacy contact us directly for most prescription requests.  Contact the office directly for refills of narcotics (pain medications). Allow 48-72 hours for refills.  Appointments: Please contact the [facility] scheduling department [phone] for questions regarding [facility] appointment scheduling.  Contact the schedulers with any scheduling changes so that your appointment can be rescheduled in a timely manner.   Central Scheduling for [facility] [phone] - Call to schedule procedures such as PET scans, CT scans, MRI, Ultrasound, etc.  To afford each patient quality time with our providers, please arrive 30 minutes before your scheduled appointment time.  If you arrive late for your appointment, you may be asked to reschedule.  We strive to give you quality time with our providers, and arriving late affects you and other patients whose appointments are after yours. If you are a no show for multiple scheduled visits, you may be dismissed from the clinic at the providers discretion.     Resources: [facility] Social Workers [phone] for additional information on assistance programs --Anne Cunningham/Abigail Elmore  [place] DSS  [phone]: Information regarding food stamps, Medicaid, and utility assistance [employer] [phone]   [place] [employer]'s shared-ride transportation service for eligible riders who have a disability that prevents them from riding the fixed route bus.   Medicare Rights Center 800-333-4114 Helps people with  Medicare understand their rights and benefits, navigate the Medicare system, and secure the quality healthcare they deserve [employer] 800-227-2345 Assists patients locate various types of support and financial assistance Cancer Care: 1-800-813-HOPE (4673) Provides financial assistance, online support groups, medication/co-pay assistance.   Transportation Assistance for appointments at [facility]: Transportation Coordinator [phone]  Again, thank you for choosing [facility] for your care.       

## 2019-04-26 NOTE — Progress Notes (Signed)
HEMATOLOGY/ONCOLOGY CONSULTATION NOTE  Date of Service: 04/26/2019  Patient Care Team: Hamrick, Lorin Mercy, MD as PCP - General (Family Medicine)  CHIEF COMPLAINTS/PURPOSE OF CONSULTATION:  Lymphocytosis   HISTORY OF PRESENTING ILLNESS:   Dean Dixon is a wonderful 68 y.o. male who has been referred to [place] by Dr Daiva Eves for evaluation and management of Lymphocytosis. The pt reports that he is doing well overall.   The pt reports that he has had a high lymphocyte count for several years but it has never been as high as now. 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 This most recent lymphocytosis was picked up by him looking at his own blood in the lab. He has not felt any differently in the last 6 months to 1 year. Pt has arthritis, tendonitis and bursitis in his shoulder. He has not had any concerns of RA and no significantly swollen joints. Pt tore an achilles tendon and then flew on a plane which lead to a blood clot. He has been on a testosterone supplement before but has not used it in a while. Pt has had diverticulitis. He has been losing weight slowly by eating better but has not noticed any unexpected weight loss. Pt does not smoke, does not share needles and has not needed any blood transfusions in the past. Pt denies any respiratory illness or infection symptoms in the last few months. He used to have an elevated IgM level about 30 years ago. He does have several fatty lipomas, a few which he has had removed, but there have been no concerns associated with them. He has recently stopped taking herbal supplements until he can figure out what is going on with his blood counts. He has been taking Glucosamine, Chondroitin & MSM as well as a 25 mg CBD gummy and 10 mg of CBD SL for his joint pain for a few years. He is currently taking Glipizide and Metformin. The Metformin did give the pt diarrhea, which has since resolved.   Most recent lab results (04/12/19) of CBC w/diff and CMP is as  follows: all values are WNL except for Glucose at 191, WBC at 30.5K, Lymphs Abs at 19.6K, Mono Abs at 3.6K, Eos Abs at 0.5K, Baso Abs at 0.3K, Abs Immature Granulocytes at 0.3K. 04/12/2019 PSA at 2.2  On review of systems, pt reports joint pain and denies rashes, fevers, chills, night sweats, unexpected weight loss, issues with swallowing, abdominal pain, bowel movement issues, leg pain, respiratory symptoms, diarrhea and any other symptoms.   On PMHx the pt reports HTN, Diabetes, Arthritis, Diverticulitis. On Social Hx the pt reports that he is a non-smoker.   MEDICAL HISTORY:  Past Medical History:  Diagnosis Date  . Diabetes mellitus without complication   . Hypertension     SURGICAL HISTORY: Past Surgical History:  Procedure Laterality Date  . APPENDECTOMY    . TONSILLECTOMY      SOCIAL HISTORY: Social History   Socioeconomic History  . Marital status: Unknown    Spouse name: Not on file  . Number of children: Not on file  . Years of education: Not on file  . Highest education level: Not on file  Occupational History  . Not on file  Social Needs  . Financial resource strain: Not on file  . Food insecurity    Worry: Not on file    Inability: Not on file  . Transportation needs    Medical: Not on file  Non-medical: Not on file  Tobacco Use  . Smoking status: Never Smoker  . Smokeless tobacco: Never Used  Substance and Sexual Activity  . Alcohol use: Yes    Alcohol/week: 2.0 standard drinks    Types: 2 Standard drinks or equivalent per week  . Drug use: No  . Sexual activity: Not on file  Lifestyle  . Physical activity    Days per week: Not on file    Minutes per session: Not on file  . Stress: Not on file  Relationships  . Social Herbalist on phone: Not on file    Gets together: Not on file    Attends religious service: Not on file    Active member of club or organization: Not on file    Attends meetings of clubs or organizations: Not on  file    Relationship status: Not on file  . Intimate partner violence    Fear of current or ex partner: Not on file    Emotionally abused: Not on file    Physically abused: Not on file    Forced sexual activity: Not on file  Other Topics Concern  . Not on file  Social History Narrative  . Not on file    FAMILY HISTORY: No family history on file.  ALLERGIES:  has No Known Allergies.  MEDICATIONS:  Current Outpatient Medications  Medication Sig Dispense Refill  . amLODipine (NORVASC) 10 MG tablet Take 10 mg by mouth daily.    Marland Kitchen HYDROcodone-acetaminophen (NORCO) 10-325 MG per tablet Take 1-2 tablets by mouth every 4 (four) hours as needed (Pain). 30 tablet 0  . ramipril (ALTACE) 10 MG capsule Take 10 mg by mouth daily.    Marland Kitchen triamterene-hydrochlorothiazide (MAXZIDE-25) 37.5-25 MG per tablet Take 1 tablet by mouth daily.     No current facility-administered medications for this visit.     REVIEW OF SYSTEMS:    10 Point review of Systems was done is negative except as noted above.  PHYSICAL EXAMINATION: ECOG PERFORMANCE STATUS: 0 - Asymptomatic  . Vitals:   04/26/19 1121  BP: (!) 158/63  Pulse: 83  Resp: 18  Temp: 98.2 F (36.8 C)  SpO2: 97%   Filed Weights   04/26/19 1121  Weight: 239 lb 6.4 oz (108.6 kg)   .Body mass index is 41.07 kg/m.  GENERAL:alert, in no acute distress and comfortable SKIN: no acute rashes, no significant lesions EYES: conjunctiva are pink and non-injected, sclera anicteric OROPHARYNX: MMM, no exudates, no oropharyngeal erythema or ulceration NECK: supple, no JVD LYMPH:  no palpable lymphadenopathy in the cervical or inguinal regions, small axillary lymph node left side  LUNGS: clear to auscultation b/l with normal respiratory effort HEART: regular rate & rhythm ABDOMEN:  normoactive bowel sounds , non tender, not distended. Extremity: no pedal edema PSYCH: alert & oriented x 3 with fluent speech NEURO: no focal motor/sensory deficits   LABORATORY DATA:  I have reviewed the data as listed  . CBC Latest Ref Rng & Units 04/26/2019 10/24/2014 10/24/2014  WBC 4.0 - 10.5 K/uL 28.2(H) 12.3(H) 14.4(H)  Hemoglobin 13.0 - 17.0 g/dL 13.0 13.2 13.7  Hematocrit 39.0 - 52.0 % 39.6 40.3 41.1  Platelets 150 - 400 K/uL 153 143(L) 160    . CMP Latest Ref Rng & Units 04/26/2019 12/15/2014 10/24/2014  Glucose 70 - 99 mg/dL 224(H) - 205(H)  BUN 8 - 23 mg/dL 13 - 21(H)  Creatinine 0.61 - 1.24 mg/dL 0.98 0.87 1.13  Sodium 135 -  145 mmol/L 142 - 139  Potassium 3.5 - 5.1 mmol/L 4.1 - 4.4  Chloride 98 - 111 mmol/L 103 - 105  CO2 22 - 32 mmol/L 27 - 25  Calcium 8.9 - 10.3 mg/dL 9.0 - 8.5(L)  Total Protein 6.5 - 8.1 g/dL 7.4 - 6.3(L)  Total Bilirubin 0.3 - 1.2 mg/dL 0.4 - 0.5  Alkaline Phos 38 - 126 U/L 91 - 51  AST 15 - 41 U/L 21 - 33  ALT 0 - 44 U/L 29 - 32    WBC Neutros Lymphs  02/05/2017 9.9K 6.0K 2.6K  10/06/2017 12.6K 7.7K 3.2K  11/02/2018 12.3K 5.2K 5.6K    RADIOGRAPHIC STUDIES: I have personally reviewed the radiological images as listed and agreed with the findings in the report. No results found.  ASSESSMENT & PLAN:   68 yo male with   1) Progressively increasing Lymphocytosis concerning for a chronic lymphoproliferative disorder likely CLL PLAN: -Discussed patient's most recent labs from 04/12/19, all values are WNL except for Glucose at 191, WBC at 30.5K, Lymphs Abs at 19.6K, Mono Abs at 3.6K, Eos Abs at 0.5K, Baso Abs at 0.3K, Abs Immature Granulocytes at 0.3K. -Discussed 04/12/2019 PSA at 2.2 -No anemia, no thrombocytopenia -No significant clinical exam findings of LNadenopathy, splenomegaly. -Will get flow cytometry to determine if lymphocytes are a clonal population -CLL FISH prognostic panel -Will get labs today -Will see back in 10 days via phone    FOLLOW UP: Labs today Phone visit with Dr Irene Limbo in about 10 days   All of the patients questions were answered with apparent satisfaction. The patient knows to  call the clinic with any problems, questions or concerns.  I spent 30 mins counseling the patient face to face. The total time spent in the appointment was 45 minutes and more than 50% was on counseling and direct patient cares.    Sullivan Lone MD [place] AAHIVMS [facility] Hematology/Oncology Physician [facility]  (Office):       [phone] (Work cell):  [phone] (Fax):           [phone]  04/26/2019 5:00 PM  I, Yevette Edwards, am acting as a scribe for Dr. Sullivan Lone.   .I have reviewed the above documentation for accuracy and completeness, and I agree with the above. Brunetta Genera MD

## 2019-04-28 LAB — SURGICAL PATHOLOGY

## 2019-04-29 LAB — FLOW CYTOMETRY

## 2019-05-10 ENCOUNTER — Inpatient Hospital Stay (HOSPITAL_BASED_OUTPATIENT_CLINIC_OR_DEPARTMENT_OTHER): Payer: Medicare Other | Admitting: Hematology

## 2019-05-10 DIAGNOSIS — C9110 Chronic lymphocytic leukemia of B-cell type not having achieved remission: Secondary | ICD-10-CM

## 2019-05-10 DIAGNOSIS — D479 Neoplasm of uncertain behavior of lymphoid, hematopoietic and related tissue, unspecified: Secondary | ICD-10-CM | POA: Diagnosis not present

## 2019-05-10 LAB — FISH,CLL PROGNOSTIC PANEL

## 2019-05-10 NOTE — Progress Notes (Signed)
HEMATOLOGY/ONCOLOGY CONSULTATION NOTE  Date of Service: 05/10/2019  Patient Care Team: Leonides Sake, MD as PCP - General (Family Medicine)  CHIEF COMPLAINTS/PURPOSE OF CONSULTATION:  Lymphocytosis /newly diagnosed CD5 lymphoproliferative disorder   HISTORY OF PRESENTING ILLNESS:   Dean Dixon is a wonderful 68 y.o. male who has been referred to [place] by Dr Daiva Eves for evaluation and management of Lymphocytosis. The pt reports that he is doing well overall.   The pt reports that he has had a high lymphocyte count for several years but it has never been as high as now. 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 This most recent lymphocytosis was picked up by him looking at his own blood in the lab. He has not felt any differently in the last 6 months to 1 year. Pt has arthritis, tendonitis and bursitis in his shoulder. He has not had any concerns of RA and no significantly swollen joints. Pt tore an achilles tendon and then flew on a plane which lead to a blood clot. He has been on a testosterone supplement before but has not used it in a while. Pt has had diverticulitis. He has been losing weight slowly by eating better but has not noticed any unexpected weight loss. Pt does not smoke, does not share needles and has not needed any blood transfusions in the past. Pt denies any respiratory illness or infection symptoms in the last few months. He used to have an elevated IgM level about 30 years ago. He does have several fatty lipomas, a few which he has had removed, but there have been no concerns associated with them. He has recently stopped taking herbal supplements until he can figure out what is going on with his blood counts. He has been taking Glucosamine, Chondroitin & MSM as well as a 25 mg CBD gummy and 10 mg of CBD SL for his joint pain for a few years. He is currently taking Glipizide and Metformin. The Metformin did give the pt diarrhea, which has since resolved.   Most recent lab  results (04/12/19) of CBC w/diff and CMP is as follows: all values are WNL except for Glucose at 191, WBC at 30.5K, Lymphs Abs at 19.6K, Mono Abs at 3.6K, Eos Abs at 0.5K, Baso Abs at 0.3K, Abs Immature Granulocytes at 0.3K. 04/12/2019 PSA at 2.2  On review of systems, pt reports joint pain and denies rashes, fevers, chills, night sweats, unexpected weight loss, issues with swallowing, abdominal pain, bowel movement issues, leg pain, respiratory symptoms, diarrhea and any other symptoms.   On PMHx the pt reports HTN, Diabetes, Arthritis, Diverticulitis. On Social Hx the pt reports that he is a non-smoker.  INTERVAL HISTORY:   I connected with  Dean Dixon on 05/10/19 by telephone and verified that I am speaking with the correct person using two identifiers.   I discussed the limitations of evaluation and management by telemedicine. The patient expressed understanding and agreed to proceed.  Other persons participating in the visit and their role in the encounter:     -Yevette Edwards, Medical Scribe  Patient's location: Home Provider's location: [facility] at [place] is a wonderful 68 y.o. male who has been referred to [place] by Dr Daiva Eves for evaluation and management of Lymphocytosis. The patient's last visit with [place] was on 04/26/2019. The pt reports that he is doing well overall.  The pt reports that there have been no concerns in the interim  and he is feeling fine. Pt denies any constitutional symptoms or any new issues. He has an allergic reaction to the contrast dye he took for an MRI previously.   Of note since the patient's last visit, pt has had Flow Cytometry [phone]) completed on 04/26/2019 with results revealing "Abnormal B-cell population identified- CD 5, CD20 andHLA-DR positive."  Pt has had FISH completed on 04/26/2019 with results revealing "Positive for p53 (17p13) deletion."  Lab results (04/26/19) of CBC w/diff and CMP is as  follows: all values are WNL except for WBC at 28.2K, Lymphs Abs at 17.1K, Mono Abs at 3.4K, Baso Abs at 0.2K, WBC Morphology shows "Smude Cells", Smear Review shows "Large and Giant PLTs", Abs Immature Granulocytes at 0.35K, Glucose at 224.  04/26/2019 LDH at 174 04/26/2019 HCV Ab is "Non Reactive" 04/26/2019 Hep B Core Total Ab is "Non Reactive" 04/26/2019 Hepatitis B Surface Ag is "Non Reactive"  On review of systems, pt denies fevers, chills, night sweats and any other symptoms.    MEDICAL HISTORY:  Past Medical History:  Diagnosis Date  . Diabetes mellitus without complication   . Hypertension     SURGICAL HISTORY: Past Surgical History:  Procedure Laterality Date  . APPENDECTOMY    . TONSILLECTOMY      SOCIAL HISTORY: Social History   Socioeconomic History  . Marital status: Divorced    Spouse name: Not on file  . Number of children: Not on file  . Years of education: Not on file  . Highest education level: Not on file  Occupational History  . Not on file  Social Needs  . Financial resource strain: Not on file  . Food insecurity    Worry: Not on file    Inability: Not on file  . Transportation needs    Medical: Not on file    Non-medical: Not on file  Tobacco Use  . Smoking status: Never Smoker  . Smokeless tobacco: Never Used  Substance and Sexual Activity  . Alcohol use: Yes    Alcohol/week: 2.0 standard drinks    Types: 2 Standard drinks or equivalent per week  . Drug use: No  . Sexual activity: Not on file  Lifestyle  . Physical activity    Days per week: Not on file    Minutes per session: Not on file  . Stress: Not on file  Relationships  . Social Herbalist on phone: Not on file    Gets together: Not on file    Attends religious service: Not on file    Active member of club or organization: Not on file    Attends meetings of clubs or organizations: Not on file    Relationship status: Not on file  . Intimate partner violence     Fear of current or ex partner: Not on file    Emotionally abused: Not on file    Physically abused: Not on file    Forced sexual activity: Not on file  Other Topics Concern  . Not on file  Social History Narrative  . Not on file    FAMILY HISTORY: No family history on file.  ALLERGIES:  has No Known Allergies.  MEDICATIONS:  Current Outpatient Medications  Medication Sig Dispense Refill  . amLODipine (NORVASC) 10 MG tablet Take 10 mg by mouth daily.    Marland Kitchen CANNABIDIOL PO Take 30 mg by mouth daily.    . Glucosamine-Chondroitin (GLUCOSAMINE CHONDR COMPLEX PO) Take 1 tablet by mouth daily.    Marland Kitchen  HYDROcodone-acetaminophen (NORCO) 10-325 MG per tablet Take 1-2 tablets by mouth every 4 (four) hours as needed (Pain). 30 tablet 0  . ramipril (ALTACE) 10 MG capsule Take 10 mg by mouth daily.    Marland Kitchen triamterene-hydrochlorothiazide (MAXZIDE-25) 37.5-25 MG per tablet Take 1 tablet by mouth daily.     No current facility-administered medications for this visit.     REVIEW OF SYSTEMS:   A 10+ POINT REVIEW OF SYSTEMS WAS OBTAINED including neurology, dermatology, psychiatry, cardiac, respiratory, lymph, extremities, GI, GU, Musculoskeletal, constitutional, breasts, reproductive, HEENT.  All pertinent positives are noted in the HPI.  All others are negative.   PHYSICAL EXAMINATION: ECOG PERFORMANCE STATUS: 0 - Asymptomatic  . There were no vitals filed for this visit. There were no vitals filed for this visit. .There is no height or weight on file to calculate BMI.  Telehealth visit   LABORATORY DATA:  I have reviewed the data as listed  . CBC Latest Ref Rng & Units 04/26/2019 10/24/2014 10/24/2014  WBC 4.0 - 10.5 K/uL 28.2(H) 12.3(H) 14.4(H)  Hemoglobin 13.0 - 17.0 g/dL 13.0 13.2 13.7  Hematocrit 39.0 - 52.0 % 39.6 40.3 41.1  Platelets 150 - 400 K/uL 153 143(L) 160    . CMP Latest Ref Rng & Units 04/26/2019 12/15/2014 10/24/2014  Glucose 70 - 99 mg/dL 224(H) - 205(H)  BUN 8 - 23 mg/dL 13 -  21(H)  Creatinine 0.61 - 1.24 mg/dL 0.98 0.87 1.13  Sodium 135 - 145 mmol/L 142 - 139  Potassium 3.5 - 5.1 mmol/L 4.1 - 4.4  Chloride 98 - 111 mmol/L 103 - 105  CO2 22 - 32 mmol/L 27 - 25  Calcium 8.9 - 10.3 mg/dL 9.0 - 8.5(L)  Total Protein 6.5 - 8.1 g/dL 7.4 - 6.3(L)  Total Bilirubin 0.3 - 1.2 mg/dL 0.4 - 0.5  Alkaline Phos 38 - 126 U/L 91 - 51  AST 15 - 41 U/L 21 - 33  ALT 0 - 44 U/L 29 - 32   Surgical Pathology  CASE: [id number]  PATIENT: Dean Dixon  Flow Pathology Report      Clinical history: Lymphocytosis      DIAGNOSIS:   -Abnormal B-cell population identified.  -See comment   COMMENT:   Analysis of the lymphoid population shows a major abnormal B-cell  population representing 74% of all lymphocytes with expression of  HLA-Dr, CD20 and CD5. This is associated with extremely dim/negative  staining for surface immunoglobulin light chains hindering assessment  and or quantitation of clonality. No CD10, CD38, or CD34 expression is  identified. The findings are most consistent with a B-cell  lymphoproliferative process. Examination of peripheral blood shows  predominance of medium and large atypical lymphoid cells characterized  by coarse chromatin, moderately abundant agranular blue cytoplasm and  occasional cells with small nucleoli. Consideration should be given to  chronic lymphocytic leukemia variant, mantle cell lymphoma, as well as  other CD5 positive B-cell lymphoproliferative processes. FISH studies  may be helpful in this regard.   GATING AND PHENOTYPIC ANALYSIS:   Gated population: Flow cytometric immunophenotyping is performed using  antibodies to the antigens listed in the table below. Electronic gates  are placed around a cell cluster displaying light scatter properties  corresponding to: lymphocytes   Abnormal Cells in gated population: 74%   Phenotype of Abnormal Cells: CD5, CD20, HLA-Dr    04/26/2019 Flow Cytometry  [phone]):    05/02/2019 FISH:    WBC Neutros Lymphs  02/05/2017 9.9K 6.0K 2.6K  10/06/2017  12.6K 7.7K 3.2K  11/02/2018 12.3K 5.2K 5.6K    RADIOGRAPHIC STUDIES: I have personally reviewed the radiological images as listed and agreed with the findings in the report. No results found.  ASSESSMENT & PLAN:   68 yo male with   1) Progressively increasing Lymphocytosis concerning for a lymphoproliferative. CD5+ve lymphoproliferative disorder likely CLL less likely Mantle cell lymphoma CLL FISH shows 17p mutation  PLAN: -Discussed pt labwork, 04/26/19; all values are WNL except for WBC at 28.2K, Lymphs Abs at 17.1K, Mono Abs at 3.4K, Baso Abs at 0.2K, WBC Morphology shows "Smudge Cells", Smear Review shows "Large and Giant PLTs", Abs Immature Granulocytes at 0.35K, Glucose at 224.  -Discussed 04/26/2019 LDH at 174 -Discussed 04/26/2019 HCV Ab is "Non Reactive" -Discussed 04/26/2019 Hep B Core Total Ab is "Non Reactive" -Discussed 04/26/2019 Hepatitis B Surface Ag is "Non Reactive" -Discussed 04/26/2019 Flow Cytometry [phone]) which revealed "Abnormal B-cell population identified. CD5+ clonal lymphoproliferative disorder" -Discussed 04/26/2019 FISH which revealed "Positive for p53 (17p13) deletion." -Advised pt he has a CD5 positive lymphoproliferative disorder, overall picuture suggests CLL  - need further genetic testing to r/o other CD5 positive positive lymphoproliferative disorders like Mantle Cell Lymphoma -Advised pt that 17p mutations are a negative predictor in regards to time to treatment and usually is treated better with targeted therapy than chemotherapy  -Advised pt that in the event of CLL we would move to treat if pt has bulky disease, quickly changing lymph nodes, cytopenias, constitutional symptoms or threatened end-organs. Discussed that treating very early, or aggressively does not statistically lead to better outcomes.  -No anemia, no thrombocytopenia -No  significant clinical exam findings of LNadenopathy, splenomegaly. -Pt denies any consitiutional symptoms  -If we can confirm slow-growing process plan on monitoring with labs and clinical examinations every 4 months  -Will get additional labs with PET/CT scan -Will get a PET/CT scan   FOLLOW UP: -Labs and PET/CT on 12/2 or 12/4 (per patients request)   The total time spent in the appt was 25 minutes and more than 50% was on counseling and direct patient cares.  All of the patient's questions were answered with apparent satisfaction. The patient knows to call the clinic with any problems, questions or concerns.   Sullivan Lone MD [place] AAHIVMS [facility] Hematology/Oncology Physician [facility]  (Office):       [phone] (Work cell):  [phone] (Fax):           [phone]  05/10/2019 4:06 PM  I, Yevette Edwards, am acting as a scribe for Dr. Sullivan Lone.   .I have reviewed the above documentation for accuracy and completeness, and I agree with the above. Brunetta Genera MD

## 2019-05-11 ENCOUNTER — Telehealth: Payer: Self-pay | Admitting: Hematology

## 2019-05-11 NOTE — Telephone Encounter (Signed)
Scheduled appt per 11/17 los.  Spoke with pt and he is aware of the appt date and time.  Central radiology will contact patient about scan appt.

## 2019-05-17 ENCOUNTER — Telehealth: Payer: Self-pay | Admitting: *Deleted

## 2019-05-17 NOTE — Telephone Encounter (Signed)
Patient called to ask for results from [facility] test, completed 11/3. Informed patient that [facility] results were discussed on 11/17 during appt w/Dr.Kale. Patient asked if notes could be sent to him and gave fax # [phone]. OV notes faxed to patient as requested. Fax confirmation received

## 2019-05-18 ENCOUNTER — Other Ambulatory Visit: Payer: Self-pay | Admitting: Hematology

## 2019-05-18 DIAGNOSIS — C9110 Chronic lymphocytic leukemia of B-cell type not having achieved remission: Secondary | ICD-10-CM

## 2019-05-18 DIAGNOSIS — D479 Neoplasm of uncertain behavior of lymphoid, hematopoietic and related tissue, unspecified: Secondary | ICD-10-CM

## 2019-05-25 ENCOUNTER — Other Ambulatory Visit: Payer: Self-pay

## 2019-05-25 ENCOUNTER — Other Ambulatory Visit: Payer: Self-pay | Admitting: Hematology

## 2019-05-25 ENCOUNTER — Inpatient Hospital Stay: Payer: Medicare Other | Attending: Hematology

## 2019-05-25 ENCOUNTER — Other Ambulatory Visit: Payer: Self-pay | Admitting: *Deleted

## 2019-05-25 DIAGNOSIS — Z7982 Long term (current) use of aspirin: Secondary | ICD-10-CM | POA: Insufficient documentation

## 2019-05-25 DIAGNOSIS — D72820 Lymphocytosis (symptomatic): Secondary | ICD-10-CM | POA: Insufficient documentation

## 2019-05-25 DIAGNOSIS — Z79899 Other long term (current) drug therapy: Secondary | ICD-10-CM | POA: Insufficient documentation

## 2019-05-25 DIAGNOSIS — D479 Neoplasm of uncertain behavior of lymphoid, hematopoietic and related tissue, unspecified: Secondary | ICD-10-CM

## 2019-05-25 DIAGNOSIS — C9110 Chronic lymphocytic leukemia of B-cell type not having achieved remission: Secondary | ICD-10-CM

## 2019-05-25 DIAGNOSIS — K449 Diaphragmatic hernia without obstruction or gangrene: Secondary | ICD-10-CM | POA: Diagnosis not present

## 2019-05-25 DIAGNOSIS — I10 Essential (primary) hypertension: Secondary | ICD-10-CM | POA: Insufficient documentation

## 2019-05-25 DIAGNOSIS — E119 Type 2 diabetes mellitus without complications: Secondary | ICD-10-CM | POA: Insufficient documentation

## 2019-05-25 LAB — CBC WITH DIFFERENTIAL/PLATELET
Abs Immature Granulocytes: 0.6 10*3/uL — ABNORMAL HIGH (ref 0.00–0.07)
Basophils Absolute: 0.6 10*3/uL — ABNORMAL HIGH (ref 0.0–0.1)
Basophils Relative: 2 %
Eosinophils Absolute: 0.6 10*3/uL — ABNORMAL HIGH (ref 0.0–0.5)
Eosinophils Relative: 2 %
HCT: 40 % (ref 39.0–52.0)
Hemoglobin: 12.9 g/dL — ABNORMAL LOW (ref 13.0–17.0)
Lymphocytes Relative: 70 %
Lymphs Abs: 19.4 10*3/uL — ABNORMAL HIGH (ref 0.7–4.0)
MCH: 28.6 pg (ref 26.0–34.0)
MCHC: 32.3 g/dL (ref 30.0–36.0)
MCV: 88.7 fL (ref 80.0–100.0)
Metamyelocytes Relative: 1 %
Monocytes Absolute: 1.1 10*3/uL — ABNORMAL HIGH (ref 0.1–1.0)
Monocytes Relative: 4 %
Myelocytes: 1 %
Neutro Abs: 5.5 10*3/uL (ref 1.7–7.7)
Neutrophils Relative %: 20 %
Platelets: 133 10*3/uL — ABNORMAL LOW (ref 150–400)
RBC: 4.51 MIL/uL (ref 4.22–5.81)
RDW: 15.4 % (ref 11.5–15.5)
WBC: 27.7 10*3/uL — ABNORMAL HIGH (ref 4.0–10.5)
nRBC: 0 % (ref 0.0–0.2)

## 2019-05-25 LAB — CMP (CANCER CENTER ONLY)
ALT: 31 U/L (ref 0–44)
AST: 20 U/L (ref 15–41)
Albumin: 4.3 g/dL (ref 3.5–5.0)
Alkaline Phosphatase: 94 U/L (ref 38–126)
Anion gap: 9 (ref 5–15)
BUN: 12 mg/dL (ref 8–23)
CO2: 28 mmol/L (ref 22–32)
Calcium: 8.8 mg/dL — ABNORMAL LOW (ref 8.9–10.3)
Chloride: 105 mmol/L (ref 98–111)
Creatinine: 0.97 mg/dL (ref 0.61–1.24)
GFR, Est AFR Am: >60 mL/min (ref >60)
GFR, Estimated: >60 mL/min (ref >60)
Glucose, Bld: 232 mg/dL — ABNORMAL HIGH (ref 70–99)
Potassium: 4.4 mmol/L (ref 3.5–5.1)
Sodium: 142 mmol/L (ref 135–145)
Total Bilirubin: 0.4 mg/dL (ref 0.3–1.2)
Total Protein: 6.8 g/dL (ref 6.5–8.1)

## 2019-05-31 ENCOUNTER — Other Ambulatory Visit: Payer: Self-pay

## 2019-05-31 ENCOUNTER — Encounter (HOSPITAL_COMMUNITY)
Admission: RE | Admit: 2019-05-31 | Discharge: 2019-05-31 | Disposition: A | Discharge status: Home / Self Care | Payer: Medicare Other | Source: Ambulatory Visit | Attending: Hematology | Admitting: Hematology

## 2019-05-31 DIAGNOSIS — N400 Enlarged prostate without lower urinary tract symptoms: Secondary | ICD-10-CM | POA: Insufficient documentation

## 2019-05-31 DIAGNOSIS — N281 Cyst of kidney, acquired: Secondary | ICD-10-CM | POA: Diagnosis not present

## 2019-05-31 DIAGNOSIS — K5730 Diverticulosis of large intestine without perforation or abscess without bleeding: Secondary | ICD-10-CM | POA: Diagnosis not present

## 2019-05-31 DIAGNOSIS — K7689 Other specified diseases of liver: Secondary | ICD-10-CM | POA: Insufficient documentation

## 2019-05-31 DIAGNOSIS — K760 Fatty (change of) liver, not elsewhere classified: Secondary | ICD-10-CM | POA: Insufficient documentation

## 2019-05-31 DIAGNOSIS — R161 Splenomegaly, not elsewhere classified: Secondary | ICD-10-CM | POA: Diagnosis not present

## 2019-05-31 DIAGNOSIS — C9110 Chronic lymphocytic leukemia of B-cell type not having achieved remission: Secondary | ICD-10-CM

## 2019-05-31 DIAGNOSIS — D479 Neoplasm of uncertain behavior of lymphoid, hematopoietic and related tissue, unspecified: Secondary | ICD-10-CM | POA: Diagnosis not present

## 2019-05-31 LAB — GLUCOSE, CAPILLARY: Glucose-Capillary: 176 mg/dL — ABNORMAL HIGH (ref 70–99)

## 2019-05-31 MED ORDER — FLUDEOXYGLUCOSE F - 18 (FDG) INJECTION
11.9000 | Freq: Once | INTRAVENOUS | Status: AC | PRN
  Administered 2019-05-31: 11.9 via INTRAVENOUS

## 2019-06-02 ENCOUNTER — Telehealth: Payer: Self-pay | Admitting: Hematology

## 2019-06-02 DIAGNOSIS — C8318 Mantle cell lymphoma, lymph nodes of multiple sites: Secondary | ICD-10-CM

## 2019-06-02 LAB — LYMPHOMA-MANTLE CELL

## 2019-06-02 NOTE — Telephone Encounter (Signed)
Called Mr Dean Dixon and discussed his PET CT scan and positive cyclin D1-IGH gene rearrangement results suggestive of mantle cell lymphoma. Discussed that his PET CT scan shows diffuse but small lymphadenopathy that seems to have less FDG avidity than expected for typical mantle cell lymphoma.  Also has splenomegaly.  Plan -We will order ultrasound-guided core needle biopsy of one of the FDG avid lymph nodes in the neck or axilla.  Order placed. -Return to clinic with Dr. Kale in 7 to 10 days to discuss results of lymph node biopsy.   Gautam Kale MD MS Hematology/Oncology Physician [facility] 

## 2019-06-03 ENCOUNTER — Encounter (HOSPITAL_COMMUNITY): Payer: Self-pay | Admitting: Radiology

## 2019-06-03 ENCOUNTER — Other Ambulatory Visit: Payer: Self-pay

## 2019-06-03 DIAGNOSIS — C8318 Mantle cell lymphoma, lymph nodes of multiple sites: Secondary | ICD-10-CM

## 2019-06-03 NOTE — Progress Notes (Signed)
Dean Dixon Male, 68 y.o., 09/12/1950 MRN:  [id number] Phone:  [phone] (H) ... PCP:  Leonides Sake, MD Coverage:  Medicare/Medicare Part A And B Next Appt With Oncology 06/10/2019 at 8:30 AM  RE: STAT:  [place] Core Biopsy (lymph Nodes) Received: Today Message Contents  Corrie Mckusick, DO  Garth Bigness D  OK for attempt US guided biopsy or axillary (or cervical) node. Choice by VIR doctor of day.    Earleen Newport       Previous Messages   ----- Message -----  From: Garth Bigness D  Sent: 06/02/2019  5:29 PM EST  To: Ir Procedure Requests  Subject: STAT:  [place] Core Biopsy (lymph Nodes)       Procedure:  [place] CORE BIOPSY (LYMPH NODES)   Reason: Mantle cell lymphoma of lymph nodes of multiple regions, US guided core needle biopsy of FDG avid LN in the axilla or neck for tissue diagnosis of suspected mantle cell lymphoma   History:  NM PET in computer   Ordering Provider:  KALE, [place] Number: [id number]

## 2019-06-09 ENCOUNTER — Other Ambulatory Visit: Payer: Self-pay | Admitting: Radiology

## 2019-06-09 ENCOUNTER — Telehealth: Payer: Self-pay | Admitting: Hematology

## 2019-06-09 NOTE — Telephone Encounter (Signed)
Scheduled appt per 12/10 sch message - pt is aware of 12/22 appt

## 2019-06-10 ENCOUNTER — Ambulatory Visit (HOSPITAL_COMMUNITY)
Admission: RE | Admit: 2019-06-10 | Discharge: 2019-06-10 | Disposition: A | Discharge status: Home / Self Care | Payer: Medicare Other | Source: Ambulatory Visit | Attending: Hematology | Admitting: Hematology

## 2019-06-10 ENCOUNTER — Other Ambulatory Visit: Payer: Self-pay

## 2019-06-10 ENCOUNTER — Encounter (HOSPITAL_COMMUNITY): Payer: Self-pay

## 2019-06-10 ENCOUNTER — Inpatient Hospital Stay: Payer: Medicare Other

## 2019-06-10 ENCOUNTER — Ambulatory Visit: Payer: Medicare Other | Admitting: Hematology

## 2019-06-10 DIAGNOSIS — I10 Essential (primary) hypertension: Secondary | ICD-10-CM | POA: Insufficient documentation

## 2019-06-10 DIAGNOSIS — R590 Localized enlarged lymph nodes: Secondary | ICD-10-CM | POA: Diagnosis not present

## 2019-06-10 DIAGNOSIS — C9110 Chronic lymphocytic leukemia of B-cell type not having achieved remission: Secondary | ICD-10-CM | POA: Diagnosis not present

## 2019-06-10 DIAGNOSIS — E119 Type 2 diabetes mellitus without complications: Secondary | ICD-10-CM | POA: Diagnosis not present

## 2019-06-10 DIAGNOSIS — C8318 Mantle cell lymphoma, lymph nodes of multiple sites: Secondary | ICD-10-CM | POA: Diagnosis present

## 2019-06-10 DIAGNOSIS — Z79899 Other long term (current) drug therapy: Secondary | ICD-10-CM | POA: Insufficient documentation

## 2019-06-10 DIAGNOSIS — D472 Monoclonal gammopathy: Secondary | ICD-10-CM | POA: Diagnosis not present

## 2019-06-10 DIAGNOSIS — D72820 Lymphocytosis (symptomatic): Secondary | ICD-10-CM | POA: Diagnosis not present

## 2019-06-10 LAB — CBC
HCT: 41.4 % (ref 39.0–52.0)
Hemoglobin: 13.1 g/dL (ref 13.0–17.0)
MCH: 28.7 pg (ref 26.0–34.0)
MCHC: 31.6 g/dL (ref 30.0–36.0)
MCV: 90.8 fL (ref 80.0–100.0)
Platelets: 160 10*3/uL (ref 150–400)
RBC: 4.56 MIL/uL (ref 4.22–5.81)
RDW: 15.4 % (ref 11.5–15.5)
WBC: 30.6 10*3/uL — ABNORMAL HIGH (ref 4.0–10.5)
nRBC: 0 % (ref 0.0–0.2)

## 2019-06-10 LAB — GLUCOSE, CAPILLARY: Glucose-Capillary: 112 mg/dL — ABNORMAL HIGH (ref 70–99)

## 2019-06-10 LAB — PROTIME-INR
INR: 0.9 (ref 0.8–1.2)
Prothrombin Time: 12.2 seconds (ref 11.4–15.2)

## 2019-06-10 MED ORDER — SODIUM CHLORIDE 0.9 % IV SOLN: INTRAVENOUS | Status: DC

## 2019-06-10 MED ORDER — FENTANYL CITRATE (PF) 100 MCG/2ML IJ SOLN
INTRAMUSCULAR | Status: AC
  Filled 2019-06-10: qty 2

## 2019-06-10 MED ORDER — LIDOCAINE HCL (PF) 1 % IJ SOLN
INTRAMUSCULAR | Status: AC | PRN
  Administered 2019-06-10: 5 mL

## 2019-06-10 MED ORDER — MIDAZOLAM HCL 2 MG/2ML IJ SOLN
INTRAMUSCULAR | Status: AC | PRN
  Administered 2019-06-10 (×2): 1 mg via INTRAVENOUS

## 2019-06-10 MED ORDER — MIDAZOLAM HCL 2 MG/2ML IJ SOLN
INTRAMUSCULAR | Status: AC
  Filled 2019-06-10: qty 2

## 2019-06-10 MED ORDER — LIDOCAINE HCL 1 % IJ SOLN
INTRAMUSCULAR | Status: AC
  Filled 2019-06-10: qty 20

## 2019-06-10 MED ORDER — FENTANYL CITRATE (PF) 100 MCG/2ML IJ SOLN
INTRAMUSCULAR | Status: AC | PRN
  Administered 2019-06-10 (×2): 50 ug via INTRAVENOUS

## 2019-06-10 NOTE — Procedures (Signed)
Adenopathy, concern for mantle cell lymphoma  S/p RT NECK NODE CORE BX  No comp Stable ebl min Full report in pacs Path pending

## 2019-06-10 NOTE — H&P (Signed)
Chief Complaint: Patient was seen in consultation today for image guided lymph node biopsy.   Referring Physician(s): Brunetta Genera  Supervising Physician: Daryll Brod  Patient Status: Dean Dixon - Out-pt  History of Present Illness: Dean Dixon is a 68 y.o. male with a past medical history of significant for HTN, DM and lymphocytosis followed by Dr. Irene Limbo who presents today for a lymph node biopsy. Per chart, Dean Dixon has had a high lymphocyte count for several years however it has recently worsened. He was referred to heme/onc for further evaluation of his lymphocytosis and initial workup has been concerning for a lymphoproliferative disorder (CLL vs mantle cell lymphoma). He underwent a PET scan on 05/31/19 which showed multiple prominent lymph nodes with low level hypermetabolic activity in the neck, both axilla, retroperitoneum and pelvis consistent with CLL. IR has been asked to perform an image guided biopsy of one of these lymph nodes to further guide care.  Dean Dixon denies any complaints today, he tells me he just got over a bout of diverticulitis which he is very happy about. He states that he works with own blood often as he works in a [employer] for trans[employer] and that was how he discovered the increase in lymphocytes. He states understanding of the biopsy and wishes to proceed.   Past Medical History:  Diagnosis Date  . Diabetes mellitus without complication   . Hypertension     Past Surgical History:  Procedure Laterality Date  . APPENDECTOMY    . TONSILLECTOMY      Allergies: Patient has no known allergies.  Medications: Prior to Admission medications   Medication Sig Start Date End Date Taking? Authorizing Provider  amLODipine (NORVASC) 10 MG tablet Take 10 mg by mouth daily.    [provider]  CANNABIDIOL PO Take 30 mg by mouth daily.    [provider]  Glucosamine-Chondroitin (GLUCOSAMINE CHONDR COMPLEX PO) Take 1 tablet by  mouth daily.    [provider]  HYDROcodone-acetaminophen (NORCO) 10-325 MG per tablet Take 1-2 tablets by mouth every 4 (four) hours as needed (Pain). 10/24/14   Lisette Abu, PA-C  ramipril (ALTACE) 10 MG capsule Take 10 mg by mouth daily.    [provider]  triamterene-hydrochlorothiazide (MAXZIDE-25) 37.5-25 MG per tablet Take 1 tablet by mouth daily.    [provider]     No family history on file.  Social History   Socioeconomic History  . Marital status: Divorced    Spouse name: Not on file  . Number of children: Not on file  . Years of education: Not on file  . Highest education level: Not on file  Occupational History  . Not on file  Tobacco Use  . Smoking status: Never Smoker  . Smokeless tobacco: Never Used  Substance and Sexual Activity  . Alcohol use: Yes    Alcohol/week: 2.0 standard drinks    Types: 2 Standard drinks or equivalent per week  . Drug use: No  . Sexual activity: Not on file  Other Topics Concern  . Not on file  Social History Narrative  . Not on file   Social Determinants of Health   Financial Resource Strain:   . Difficulty of Paying Living Expenses: Not on file  Food Insecurity:   . Worried About Charity fundraiser in the Last Year: Not on file  . Ran Out of Food in the Last Year: Not on file  Transportation Needs:   . Lack of  Transportation (Medical): Not on file  . Lack of Transportation (Non-Medical): Not on file  Physical Activity:   . Days of Exercise per Week: Not on file  . Minutes of Exercise per Session: Not on file  Stress:   . Feeling of Stress : Not on file  Social Connections:   . Frequency of Communication with Friends and Family: Not on file  . Frequency of Social Gatherings with Friends and Family: Not on file  . Attends Religious Services: Not on file  . Active Member of Clubs or Organizations: Not on file  . Attends Archivist Meetings: Not on file  . Marital Status: Not  on file     Review of Systems: A 12 point ROS discussed and pertinent positives are indicated in the HPI above.  All other systems are negative.  Review of Systems  Constitutional: Negative for chills and fever.  Respiratory: Negative for cough and shortness of breath.   Cardiovascular: Negative for chest pain.  Gastrointestinal: Negative for abdominal pain, blood in stool, diarrhea, nausea and vomiting.  Musculoskeletal: Negative for back pain.  Skin: Negative for rash and wound.  Neurological: Negative for dizziness and headaches.    Vital Signs: BP (!) 165/83   Pulse 70   Temp 98.6 F (37 C) (Oral)   SpO2 98%   Physical Exam Vitals reviewed.  Constitutional:      General: He is not in acute distress. HENT:     Head: Normocephalic.     Mouth/Throat:     Mouth: Mucous membranes are moist.     Pharynx: Oropharynx is clear. No oropharyngeal exudate or posterior oropharyngeal erythema.  Cardiovascular:     Rate and Rhythm: Normal rate and regular rhythm.  Pulmonary:     Effort: Pulmonary effort is normal.     Breath sounds: Normal breath sounds.  Abdominal:     General: Bowel sounds are normal. There is no distension.     Palpations: Abdomen is soft.     Tenderness: There is no abdominal tenderness.  Skin:    General: Skin is warm and dry.  Neurological:     Mental Status: He is alert and oriented to person, place, and time.  Psychiatric:        Mood and Affect: Mood normal.        Behavior: Behavior normal.        Thought Content: Thought content normal.        Judgment: Judgment normal.      MD Evaluation Airway: WNL Heart: WNL Abdomen: WNL Chest/ Lungs: WNL ASA  Classification: 2 Mallampati/Airway Score: One   Imaging: NM PET Image Initial (PI) Skull Base To Thigh  Result Date: 05/31/2019 CLINICAL DATA:  Initial treatment strategy for recently diagnosed chronic lymphocytic leukemia. EXAM: NUCLEAR MEDICINE PET SKULL BASE TO THIGH TECHNIQUE: 11.9 mCi  F-18 FDG was injected intravenously. Full-ring PET imaging was performed from the skull base to thigh after the radiotracer. CT data was obtained and used for attenuation correction and anatomic localization. Fasting blood glucose: 176 mg/dl COMPARISON:  CT chest and abdomen 10/23/2014. Abdominal MRI 12/15/2014. FINDINGS: Mediastinal blood pool activity: SUV max 3.4 Liver activity: SUV max 4.7 NECK: There are low-level hypermetabolic level 2 and level 3 cervical lymph nodes bilaterally which are not significantly enlarged. These have an SUV max of 5.4 on the right and 5.8 on the left.There are no lesions of the pharyngeal mucosal space. Incidental CT findings: none CHEST: There are numerous small axillary lymph  nodes which are mildly hypermetabolic. These include a 9 mm right axillary node on image 62/4 (SUV max 3.2) and a left axillary node with an SUV max of 4.2. There are no hypermetabolic or enlarged mediastinal or hilar lymph nodes. There is no suspicious pulmonary activity or nodularity. Incidental CT findings: Coronary artery atherosclerosis and a small hiatal hernia. The lungs are clear. ABDOMEN/PELVIS: There is no focal hypermetabolic activity within the liver, adrenal glands, spleen or pancreas. The spleen is mildly enlarged, measuring 15.4 x 9.1 x 15.5 cm (volume = 1100 cm^3) and demonstrates low-level hypermetabolic activity (SUV max 5.4). There are scattered prominent retroperitoneal and pelvic lymph nodes with low level hypermetabolic activity. Representative examples include a 13 mm aortocaval node on image 110/4 (SUV max 4.7), an 11 mm right external iliac node on image 163/4 (SUV max 5.6) and a 15 mm left external iliac node on image 168/4 (SUV max 5.9). Incidental CT findings: Diffuse hepatic steatosis. Hepatic cysts measuring up to 5.6 cm in the left lobe have not significantly changed. There is a cyst in the upper pole of the left kidney, aortic atherosclerosis, sigmoid diverticulosis and  moderate enlargement of the prostate gland. SKELETON: There is no hypermetabolic activity to suggest osseous metastatic disease. Incidental CT findings: none IMPRESSION: 1. There are multiple prominent lymph nodes in the neck, both axilla, the retroperitoneum and pelvis with low level hypermetabolic activity consistent with chronic lymphocytic leukemia. [place]auville 3 and 4. 2. Mild splenomegaly and mild generalized splenic hypermetabolic activity. 3. No evidence of bone marrow involvement. Electronically Signed   By: Richardean Sale M.D.   On: 05/31/2019 14:11    Labs:  CBC: Recent Labs    04/26/19 1214 05/25/19 1101  WBC 28.2* 27.7*  HGB 13.0 12.9*  HCT 39.6 40.0  PLT 153 133*    COAGS: No results for input(s): INR, APTT in the last 8760 hours.  BMP: Recent Labs    04/26/19 1214 05/25/19 1159  NA 142 142  K 4.1 4.4  CL 103 105  CO2 27 28  GLUCOSE 224* 232*  BUN 13 12  CALCIUM 9.0 8.8*  CREATININE 0.98 0.97  GFRNONAA >60 >60  GFRAA >60 >60    LIVER FUNCTION TESTS: Recent Labs    04/26/19 1214 05/25/19 1159  BILITOT 0.4 0.4  AST 21 20  ALT 29 31  ALKPHOS 91 94  PROT 7.4 6.8  ALBUMIN 4.2 4.3    TUMOR MARKERS: No results for input(s): AFPTM, CEA, CA199, CHROMGRNA in the last 8760 hours.  Assessment and Plan:  68 y/o M with known history of lymphocytosis which has recently worsened, workup performed by oncology concerning for lymphoproliferative disorder (likely CLL) and IR has been asked to perform an image guided lymph node biopsy to further guide treatment. Imaging was reviewed by Dr. Earleen Newport who has approved patient for either an axillary or cervical lymph node biopsy which is to be decided by the physician performing the procedure.   Patient has been NPO since 9 pm yesterday, he does not take blood thinning medications. Afebrile, WBC 30.6, hgb 13.1, plt 160, INR 0.9.  Risks and benefits of image guided lymph node biopsy was discussed with the patient and/or  patient's family including, but not limited to bleeding, infection, damage to adjacent structures or low yield requiring additional tests.  All of the questions were answered and there is agreement to proceed.  Consent signed and in chart.  Thank you for this interesting consult.  I greatly enjoyed meeting  Dean Dixon and look forward to participating in their care.  A copy of this report was sent to the requesting provider on this date.  Electronically Signed: Joaquim Nam, PA-C 06/10/2019, 11:34 AM   I spent a total of  30 Minutes  in face to face in clinical consultation, greater than 50% of which was counseling/coordinating care for lymph node biopsy.

## 2019-06-10 NOTE — Discharge Instructions (Signed)
Emergency on call IR MD contact: [phone] (day of procedure) Dressing - May remove bandaid and shower in 24 hours.  Keep site clean and dry.  Replace with new bandaid as necessary.   Moderate Conscious Sedation, Adult, Care After These instructions provide you with information about caring for yourself after your procedure. Your health care provider may also give you more specific instructions. Your treatment has been planned according to current medical practices, but problems sometimes occur. Call your health care provider if you have any problems or questions after your procedure. What can I expect after the procedure? After your procedure, it is common:  To feel sleepy for several hours.  To feel clumsy and have poor balance for several hours.  To have poor judgment for several hours.  To vomit if you eat too soon. Follow these instructions at home: For at least 24 hours after the procedure:  Do not: ? Participate in activities where you could fall or become injured. ? Drive. ? Use heavy machinery. ? Drink alcohol. ? Take sleeping pills or medicines that cause drowsiness. ? Make important decisions or sign legal documents. ? Take care of children on your own.  Rest. Eating and drinking  Follow the diet recommended by your health care provider.  If you vomit: ? Drink water, juice, or soup when you can drink without vomiting. ? Make sure you have little or no nausea before eating solid foods. General instructions  Have a responsible adult stay with you until you are awake and alert.  Take over-the-counter and prescription medicines only as told by your health care provider.  If you smoke, do not smoke without supervision.  Keep all follow-up visits as told by your health care provider. This is important. Contact a health care provider if:  You keep feeling nauseous or you keep vomiting.  You feel light-headed.  You develop a rash.  You have a fever. Get help  right away if:  You have trouble breathing. This information is not intended to replace advice given to you by your health care provider. Make sure you discuss any questions you have with your health care provider. Document Released: 03/30/2013 Document Revised: 05/22/2017 Document Reviewed: 09/29/2015 Elsevier Patient Education  2020 [place].   Needle Biopsy, Care After This sheet gives you information about how to care for yourself after your procedure. Your health care provider may also give you more specific instructions. If you have problems or questions, contact your health care provider. What can I expect after the procedure? After the procedure, it is common to have soreness, bruising, or mild pain at the puncture site. This should go away in a few days. Follow these instructions at home: Needle insertion site care  Wash your hands with soap and water before you change your bandage (dressing). If you cannot use soap and water, use hand sanitizer.  Follow instructions from your health care provider about how to take care of your puncture site. (above) This includes: ? When and how to change your dressing. ? When to remove your dressing.  Check your puncture site every day for signs of infection. Check for: ? Redness, swelling, or pain. ? Fluid or blood. ? Pus or a bad smell. ? Warmth. General instructions  Return to your normal activities as told by your health care provider. Ask your health care provider what activities are safe for you.  Do not take baths, swim, or use a hot tub until your health care provider approves. Ask  your health care provider if you may take showers. You may only be allowed to take sponge baths.  Take over-the-counter and prescription medicines only as told by your health care provider.  Keep all follow-up visits as told by your health care provider. This is important. Contact a health care provider if:  You have a fever.  You have  redness, swelling, or pain at the puncture site that lasts longer than a few days.  You have fluid, blood, or pus coming from your puncture site.  Your puncture site feels warm to the touch. Get help right away if:  You have severe bleeding from the puncture site. Summary  After the procedure, it is common to have soreness, bruising, or mild pain at the puncture site. This should go away in a few days. May take tylenol for mild pain as needed if approved by your provider.  Check your puncture site every day for signs of infection, such as redness, swelling, or pain.  Get help right away if you have severe bleeding from your puncture site.   This information is not intended to replace advice given to you by your health care provider. Make sure you discuss any questions you have with your health care provider. Document Released: 10/24/2014 Document Revised: 08/21/2017 Document Reviewed: 06/22/2017 Elsevier Patient Education  2020 [employer].

## 2019-06-14 ENCOUNTER — Inpatient Hospital Stay: Payer: Medicare Other

## 2019-06-14 ENCOUNTER — Other Ambulatory Visit: Payer: Self-pay

## 2019-06-14 ENCOUNTER — Inpatient Hospital Stay (HOSPITAL_BASED_OUTPATIENT_CLINIC_OR_DEPARTMENT_OTHER): Payer: Medicare Other | Admitting: Hematology

## 2019-06-14 VITALS — BP 140/67 | HR 74 | Temp 97.9°F | Resp 18 | Ht 64.02 in | Wt 236.9 lb

## 2019-06-14 DIAGNOSIS — C8318 Mantle cell lymphoma, lymph nodes of multiple sites: Secondary | ICD-10-CM

## 2019-06-14 DIAGNOSIS — Z7189 Other specified counseling: Secondary | ICD-10-CM

## 2019-06-14 DIAGNOSIS — D72820 Lymphocytosis (symptomatic): Secondary | ICD-10-CM | POA: Diagnosis not present

## 2019-06-14 LAB — CBC WITH DIFFERENTIAL (CANCER CENTER ONLY)
Abs Immature Granulocytes: 0.38 10*3/uL — ABNORMAL HIGH (ref 0.00–0.07)
Basophils Absolute: 0.3 10*3/uL — ABNORMAL HIGH (ref 0.0–0.1)
Basophils Relative: 1 %
Eosinophils Absolute: 0.9 10*3/uL — ABNORMAL HIGH (ref 0.0–0.5)
Eosinophils Relative: 3 %
HCT: 39 % (ref 39.0–52.0)
Hemoglobin: 12.5 g/dL — ABNORMAL LOW (ref 13.0–17.0)
Immature Granulocytes: 1 %
Lymphocytes Relative: 65 %
Lymphs Abs: 23.7 10*3/uL — ABNORMAL HIGH (ref 0.7–4.0)
MCH: 28.5 pg (ref 26.0–34.0)
MCHC: 32.1 g/dL (ref 30.0–36.0)
MCV: 88.8 fL (ref 80.0–100.0)
Monocytes Absolute: 4.5 10*3/uL — ABNORMAL HIGH (ref 0.1–1.0)
Monocytes Relative: 13 %
Neutro Abs: 6.1 10*3/uL (ref 1.7–7.7)
Neutrophils Relative %: 17 %
Platelet Count: 174 10*3/uL (ref 150–400)
RBC: 4.39 MIL/uL (ref 4.22–5.81)
RDW: 15.4 % (ref 11.5–15.5)
WBC Count: 35.8 10*3/uL — ABNORMAL HIGH (ref 4.0–10.5)
nRBC: 0 % (ref 0.0–0.2)

## 2019-06-14 LAB — CMP (CANCER CENTER ONLY)
ALT: 18 U/L (ref 0–44)
AST: 19 U/L (ref 15–41)
Albumin: 4.1 g/dL (ref 3.5–5.0)
Alkaline Phosphatase: 95 U/L (ref 38–126)
Anion gap: 12 (ref 5–15)
BUN: 15 mg/dL (ref 8–23)
CO2: 25 mmol/L (ref 22–32)
Calcium: 9 mg/dL (ref 8.9–10.3)
Chloride: 107 mmol/L (ref 98–111)
Creatinine: 0.86 mg/dL (ref 0.61–1.24)
GFR, Est AFR Am: >60 mL/min (ref >60)
GFR, Estimated: >60 mL/min (ref >60)
Glucose, Bld: 139 mg/dL — ABNORMAL HIGH (ref 70–99)
Potassium: 4.2 mmol/L (ref 3.5–5.1)
Sodium: 144 mmol/L (ref 135–145)
Total Bilirubin: 0.4 mg/dL (ref 0.3–1.2)
Total Protein: 7.2 g/dL (ref 6.5–8.1)

## 2019-06-14 LAB — LACTATE DEHYDROGENASE: LDH: 198 U/L — ABNORMAL HIGH (ref 98–192)

## 2019-06-14 LAB — SURGICAL PATHOLOGY

## 2019-06-14 NOTE — Progress Notes (Signed)
HEMATOLOGY/ONCOLOGY CLINIC NOTE  Date of Service: 06/14/2019  Patient Care Team: Hamrick, Lorin Mercy, MD as PCP - General (Family Medicine)  CHIEF COMPLAINTS/PURPOSE OF CONSULTATION:  F/u for newly diagnosed mantle cell lymphoma  HISTORY OF PRESENTING ILLNESS:   Dean Dixon is a wonderful 68 y.o. male who has been referred to [place] by Dr Daiva Eves for evaluation and management of Lymphocytosis. The pt reports that he is doing well overall.   The pt reports that he has had a high lymphocyte count for several years but it has never been as high as now. 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 This most recent lymphocytosis was picked up by him looking at his own blood in the lab. He has not felt any differently in the last 6 months to 1 year. Pt has arthritis, tendonitis and bursitis in his shoulder. He has not had any concerns of RA and no significantly swollen joints. Pt tore an achilles tendon and then flew on a plane which lead to a blood clot. He has been on a testosterone supplement before but has not used it in a while. Pt has had diverticulitis. He has been losing weight slowly by eating better but has not noticed any unexpected weight loss. Pt does not smoke, does not share needles and has not needed any blood transfusions in the past. Pt denies any respiratory illness or infection symptoms in the last few months. He used to have an elevated IgM level about 30 years ago. He does have several fatty lipomas, a few which he has had removed, but there have been no concerns associated with them. He has recently stopped taking herbal supplements until he can figure out what is going on with his blood counts. He has been taking Glucosamine, Chondroitin & MSM as well as a 25 mg CBD gummy and 10 mg of CBD SL for his joint pain for a few years. He is currently taking Glipizide and Metformin. The Metformin did give the pt diarrhea, which has since resolved.   Most recent lab results (04/12/19) of CBC  w/diff and CMP is as follows: all values are WNL except for Glucose at 191, WBC at 30.5K, Lymphs Abs at 19.6K, Mono Abs at 3.6K, Eos Abs at 0.5K, Baso Abs at 0.3K, Abs Immature Granulocytes at 0.3K. 04/12/2019 PSA at 2.2  On review of systems, pt reports joint pain and denies rashes, fevers, chills, night sweats, unexpected weight loss, issues with swallowing, abdominal pain, bowel movement issues, leg pain, respiratory symptoms, diarrhea and any other symptoms.   On PMHx the pt reports HTN, Diabetes, Arthritis, Diverticulitis. On Social Hx the pt reports that he is a non-smoker.  INTERVAL HISTORY:   Dean Dixon is a wonderful 68 y.o. male who is here for evaluation and management of Lymphocytosis. The patient's last visit with [place] was on 05/10/2019. The pt reports that he is doing well overall.  The pt reports that he has not noticed any changes and has not felt any different since our last meeting. Pt would like to begin treatment after the new year. He would also prefer to have his treatments at the beginning of the week. He is hoping to continue working during treatment but he does have help if he is unable to do so. Pt would like to hold off on a port at this time.   His abdomen is not hurting at this time but he does experience diverticulitis occasionally that causes pain. He denies  any bowel perforations or ulcers.  Of note since the patient's last visit, pt has had PET/CT ([id number]) completed on 05/31/2019 with results revealing "1. There are multiple prominent lymph nodes in the neck, both axilla, the retroperitoneum and pelvis with low level hypermetabolic activity consistent with chronic lymphocytic leukemia. Deauville 3 and 4. 2. Mild splenomegaly and mild generalized splenic hypermetabolic activity 3. No evidence of bone marrow involvement."  Pt has had Flow Pathology Report ([id number]) completed on 06/10/2019 with results revealing "Abnormal B-cell population."  Pt has had  Surgical Pathology Report ([id number]) completed on 06/10/2019 with results revealing "Atypical lymphoid proliferation consistent with mantle cell lymphoma."  Lab results today (06/14/19) of CBC w/diff and CMP is as follows: all values are WNL except for WBC at 35.8K, Hgb at 12.5, Lymphs Abs at 23.7K, Mono Abs at 4.5K, Eos Abs at 0.9K, Baso Abs at 0.3, Abs Immature Granulocytes at 0.38K, Glucose at 139. 06/14/2019 LDH at 198  On review of systems, pt denies abdominal pain, leg swelling and any other symptoms.   MEDICAL HISTORY:  Past Medical History:  Diagnosis Date  . Diabetes mellitus without complication ([place])   . Hypertension     SURGICAL HISTORY: Past Surgical History:  Procedure Laterality Date  . APPENDECTOMY    . TONSILLECTOMY      SOCIAL HISTORY: Social History   Socioeconomic History  . Marital status: Divorced    Spouse name: Not on file  . Number of children: Not on file  . Years of education: Not on file  . Highest education level: Not on file  Occupational History  . Not on file  Tobacco Use  . Smoking status: Never Smoker  . Smokeless tobacco: Never Used  Substance and Sexual Activity  . Alcohol use: Yes    Alcohol/week: 2.0 standard drinks    Types: 2 Standard drinks or equivalent per week  . Drug use: No  . Sexual activity: Not on file  Other Topics Concern  . Not on file  Social History Narrative  . Not on file   Social Determinants of Health   Financial Resource Strain:   . Difficulty of Paying Living Expenses: Not on file  Food Insecurity:   . Worried About Charity fundraiser in the Last Year: Not on file  . Ran Out of Food in the Last Year: Not on file  Transportation Needs:   . Lack of Transportation (Medical): Not on file  . Lack of Transportation (Non-Medical): Not on file  Physical Activity:   . Days of Exercise per Week: Not on file  . Minutes of Exercise per Session: Not on file  Stress:   . Feeling of Stress : Not on file    Social Connections:   . Frequency of Communication with Friends and Family: Not on file  . Frequency of Social Gatherings with Friends and Family: Not on file  . Attends Religious Services: Not on file  . Active Member of Clubs or Organizations: Not on file  . Attends Archivist Meetings: Not on file  . Marital Status: Not on file  Intimate Partner Violence:   . Fear of Current or Ex-Partner: Not on file  . Emotionally Abused: Not on file  . Physically Abused: Not on file  . Sexually Abused: Not on file    FAMILY HISTORY: No family history on file.  ALLERGIES:  is allergic to ivp dye [iodinated diagnostic agents].  MEDICATIONS:  Current Outpatient Medications  Medication Sig Dispense Refill  .  aspirin 325 MG tablet Take 325 mg by mouth as needed.    Marland Kitchen CANNABIDIOL PO Take 30 mg by mouth daily.    . Glucosamine-Chondroitin (GLUCOSAMINE CHONDR COMPLEX PO) Take 1 tablet by mouth daily.    . ramipril (ALTACE) 10 MG capsule Take 10 mg by mouth daily.    Marland Kitchen triamterene-hydrochlorothiazide (MAXZIDE-25) 37.5-25 MG per tablet Take 1 tablet by mouth daily.    Marland Kitchen amLODipine (NORVASC) 10 MG tablet Take 10 mg by mouth daily.     No current facility-administered medications for this visit.    REVIEW OF SYSTEMS:   A 10+ POINT REVIEW OF SYSTEMS WAS OBTAINED including neurology, dermatology, psychiatry, cardiac, respiratory, lymph, extremities, GI, GU, Musculoskeletal, constitutional, breasts, reproductive, HEENT.  All pertinent positives are noted in the HPI.  All others are negative.   PHYSICAL EXAMINATION: ECOG PERFORMANCE STATUS: 0 - Asymptomatic  . Vitals:   06/14/19 1521  BP: 140/67  Pulse: 74  Resp: 18  Temp: 97.9 F (36.6 C)  SpO2: 97%   Filed Weights   06/14/19 1521  Weight: 236 lb 14.4 oz (107.5 kg)   .Body mass index is 40.64 kg/m.  Exam was given in a chair   GENERAL:alert, in no acute distress and comfortable SKIN: no acute rashes, no significant  lesions EYES: conjunctiva are pink and non-injected, sclera anicteric OROPHARYNX: MMM, no exudates, no oropharyngeal erythema or ulceration NECK: supple, no JVD LYMPH:  no palpable lymphadenopathy in the cervical, axillary or inguinal regions LUNGS: clear to auscultation b/l with normal respiratory effort HEART: regular rate & rhythm ABDOMEN:  normoactive bowel sounds , non tender, not distended. No palpable hepatosplenomegaly.  Extremity: no pedal edema PSYCH: alert & oriented x 3 with fluent speech NEURO: no focal motor/sensory deficits   LABORATORY DATA:  I have reviewed the data as listed  . CBC Latest Ref Rng & Units 06/14/2019 06/10/2019 05/25/2019  WBC 4.0 - 10.5 K/uL 35.8(H) 30.6(H) 27.7(H)  Hemoglobin 13.0 - 17.0 g/dL 12.5(L) 13.1 12.9(L)  Hematocrit 39.0 - 52.0 % 39.0 41.4 40.0  Platelets 150 - 400 K/uL 174 160 133(L)    . CMP Latest Ref Rng & Units 06/14/2019 05/25/2019 04/26/2019  Glucose 70 - 99 mg/dL 139(H) 232(H) 224(H)  BUN 8 - 23 mg/dL _0 Creatinine 0.61 - 1.24 mg/dL 0.86 0.97 0.98  Sodium 135 - 145 mmol/L 144 142 142  Potassium 3.5 - 5.1 mmol/L 4.2 4.4 4.1  Chloride 98 - 111 mmol/L 107 105 103  CO2 22 - 32 mmol/L _1 Calcium 8.9 - 10.3 mg/dL 9.0 8.8(L) 9.0  Total Protein 6.5 - 8.1 g/dL 7.2 6.8 7.4  Total Bilirubin 0.3 - 1.2 mg/dL 0.4 0.4 0.4  Alkaline Phos 38 - 126 U/L 95 94 91  AST 15 - 41 U/L _2 ALT 0 - 44 U/L _3 06/10/2019 Flow Pathology Report ([id number]):   06/10/2019 Surgical Pathology ([id number]):   Surgical Pathology  CASE: [id number]  PATIENT: Emily Mago  Flow Pathology Report      Clinical history: Lymphocytosis      DIAGNOSIS:   -Abnormal B-cell population identified.  -See comment   COMMENT:   Analysis of the lymphoid population shows a major abnormal B-cell  population representing 74% of all lymphocytes with expression of  HLA-Dr, CD20 and CD5. This is associated with extremely  dim/negative  staining for surface immunoglobulin light chains hindering assessment  and or quantitation of clonality. No CD10, CD38,  or CD34 expression is  identified. The findings are most consistent with a B-cell  lymphoproliferative process. Examination of peripheral blood shows  predominance of medium and large atypical lymphoid cells characterized  by coarse chromatin, moderately abundant agranular blue cytoplasm and  occasional cells with small nucleoli. Consideration should be given to  chronic lymphocytic leukemia variant, mantle cell lymphoma, as well as  other CD5 positive B-cell lymphoproliferative processes. FISH studies  may be helpful in this regard.   GATING AND PHENOTYPIC ANALYSIS:   Gated population: Flow cytometric immunophenotyping is performed using  antibodies to the antigens listed in the table below. Electronic gates  are placed around a cell cluster displaying light scatter properties  corresponding to: lymphocytes   Abnormal Cells in gated population: 74%   Phenotype of Abnormal Cells: CD5, CD20, HLA-Dr    04/26/2019 Flow Cytometry [id number]):    05/02/2019 FISH:    WBC Neutros Lymphs  02/05/2017 9.9K 6.0K 2.6K  10/06/2017 12.6K 7.7K 3.2K  11/02/2018 12.3K 5.2K 5.6K    RADIOGRAPHIC STUDIES: I have personally reviewed the radiological images as listed and agreed with the findings in the report. NM PET Image Initial (PI) Skull Base To Thigh  Result Date: 05/31/2019 CLINICAL DATA:  Initial treatment strategy for recently diagnosed chronic lymphocytic leukemia. EXAM: NUCLEAR MEDICINE PET SKULL BASE TO THIGH TECHNIQUE: 11.9 mCi F-18 FDG was injected intravenously. Full-ring PET imaging was performed from the skull base to thigh after the radiotracer. CT data was obtained and used for attenuation correction and anatomic localization. Fasting blood glucose: 176 mg/dl COMPARISON:  CT chest and abdomen 10/23/2014. Abdominal MRI 12/15/2014. FINDINGS:  Mediastinal blood pool activity: SUV max 3.4 Liver activity: SUV max 4.7 NECK: There are low-level hypermetabolic level 2 and level 3 cervical lymph nodes bilaterally which are not significantly enlarged. These have an SUV max of 5.4 on the right and 5.8 on the left.There are no lesions of the pharyngeal mucosal space. Incidental CT findings: none CHEST: There are numerous small axillary lymph nodes which are mildly hypermetabolic. These include a 9 mm right axillary node on image 62/4 (SUV max 3.2) and a left axillary node with an SUV max of 4.2. There are no hypermetabolic or enlarged mediastinal or hilar lymph nodes. There is no suspicious pulmonary activity or nodularity. Incidental CT findings: Coronary artery atherosclerosis and a small hiatal hernia. The lungs are clear. ABDOMEN/PELVIS: There is no focal hypermetabolic activity within the liver, adrenal glands, spleen or pancreas. The spleen is mildly enlarged, measuring 15.4 x 9.1 x 15.5 cm (volume = 1100 cm^3) and demonstrates low-level hypermetabolic activity (SUV max 5.4). There are scattered prominent retroperitoneal and pelvic lymph nodes with low level hypermetabolic activity. Representative examples include a 13 mm aortocaval node on image 110/4 (SUV max 4.7), an 11 mm right external iliac node on image 163/4 (SUV max 5.6) and a 15 mm left external iliac node on image 168/4 (SUV max 5.9). Incidental CT findings: Diffuse hepatic steatosis. Hepatic cysts measuring up to 5.6 cm in the left lobe have not significantly changed. There is a cyst in the upper pole of the left kidney, aortic atherosclerosis, sigmoid diverticulosis and moderate enlargement of the prostate gland. SKELETON: There is no hypermetabolic activity to suggest osseous metastatic disease. Incidental CT findings: none IMPRESSION: 1. There are multiple prominent lymph nodes in the neck, both axilla, the retroperitoneum and pelvis with low level hypermetabolic activity consistent with  chronic lymphocytic leukemia. Deauville 3 and 4. 2. Mild splenomegaly and mild generalized splenic  hypermetabolic activity. 3. No evidence of bone marrow involvement. Electronically Signed   By: Richardean Sale M.D.   On: 05/31/2019 14:11   [place] CORE BIOPSY (LYMPH NODES)  Result Date: 06/10/2019 INDICATION: Mild scattered FDG avid cervical and axillary lymph nodes. Concern for mantle cell lymphoma. EXAM: ULTRASOUND GUIDED CORE BIOPSY OF RIGHT CERVICAL LYMPH NODE MEDICATIONS: 1% LIDOCAINE LOCAL ANESTHESIA/SEDATION: Versed 2.22m IV; Fentanyl 1041m IV; Moderate Sedation Time:  11 minutes The patient was continuously monitored during the procedure by the interventional radiology nurse under my direct supervision. FLUOROSCOPY TIME:  Fluoroscopy Time: None. COMPLICATIONS: None immediate. PROCEDURE: The procedure, risks, benefits, and alternatives were explained to the patient. Questions regarding the procedure were encouraged and answered. The patient understands and consents to the procedure. The right neck was prepped with ChloraPrep in a sterile fashion, and a sterile drape was applied covering the operative field. A sterile gown and sterile gloves were used for the procedure. Local anesthesia was provided with 1% Lidocaine. Previous imaging reviewed. Preliminary ultrasound performed. A mildly enlarged right cervical lymph node was localized and marked. This was correlated with the PET-CT. Under sterile conditions and local anesthesia, an 18 gauge core biopsy device was advanced to the right cervical lymph node. Several 18 gauge core biopsies obtained. Samples placed in saline. Images obtained for documentation. Patient tolerated biopsy well. No immediate complication. FINDINGS: Imaging confirms needle placement in the right cervical enlarged lymph node for core biopsy IMPRESSION: Successful ultrasound right cervical adenopathy 18 gauge core biopsies Electronically Signed   By: M.Jerilynn Mages Shick M.D.   On: 06/10/2019 15:55     ASSESSMENT & PLAN:   [age]o male with   1) Progressively increasing Lymphocytosis concerning for a lymphoproliferative. CD5+ve lymphoproliferative disorder likely CLL less likely Mantle cell lymphoma CLL FISH shows 17p mutation 04/26/2019 Flow Cytometry ([id number] "Abnormal B-cell population identified. CD5+ clonal lymphoproliferative disorder" 04/26/2019 FISH revealed "Positive for p53 (17p13) deletion."    PLAN: -Discussed pt labwork today, 06/14/19; WBC continue to increase, blood chemistries are steady -Discussed 06/14/2019 LDH is minimally elevated at 198 -Discussed 05/31/2019 PET/CT ([id number] revealed "1. There are multiple prominent lymph nodes in the neck, both axilla, the retroperitoneum and pelvis with low level hypermetabolic activity consistent with chronic lymphocytic leukemia. Deauville 3 and 4. 2. Mild splenomegaly and mild generalized splenic hypermetabolic activity 3. No evidence of bone marrow involvement." -Discussed 06/10/2019 Flow Pathology Report ([id number]) which revealed "Abnormal B-cell population." -Discussed 06/10/2019 Surgical Pathology Report ([id number]) which revealed "Atypical lymphoid proliferation consistent with mantle cell lymphoma." -Pt has Mantle Cell Lymphoma based on labs - some cells observed with 17p deletion -Discussed treatment options, including autologous transplant, inpatient chemotherapy, and Bendamustine + Rituxan regimen -Would not recommend a more aggressive therapy - would recommend Bendamustine + Rituxan -Discussed risk of increased fatigue, nausea with Bendamustine + Rituxan -Plan to get a rpt PET/CT after 3 cycles -Will set up chemotherapy counseling in 7-10 days -Will hold off on port placement at this time -Will get weekly labs  -Bendamustine + Rituxan to begin on 06/26/2018 -Will see back in 3 weeks with labs  FOLLOW UP: Chemo-counseling for Bendamustine/Rituxan in the next 7-10 days Please  schedule chemotherapy Bendamustine/Rituxan D1 and D2 to start on 06/26/2018 with labs. MD visit with labs 1 week after C1D1 of chemotherapy for a toxicity check   The total time spent in the appt was 40 minutes and more than 50% was on counseling and direct patient cares.  All of the patient's questions  were answered with apparent satisfaction. The patient knows to call the clinic with any problems, questions or concerns.    Sullivan Lone MD [place] AAHIVMS [facility] Hematology/Oncology Physician [facility]  (Office):       [phone] (Work cell):  [phone] (Fax):           [phone]  06/14/2019 4:46 PM  I, Yevette Edwards, am acting as a scribe for Dr. Sullivan Lone.   .I have reviewed the above documentation for accuracy and completeness, and I agree with the above. Brunetta Genera MD

## 2019-06-15 ENCOUNTER — Telehealth: Payer: Self-pay | Admitting: Hematology

## 2019-06-15 NOTE — Telephone Encounter (Signed)
Scheduled appt per 12/22 los.  Spoke with pt and he is aware of all his scheduled appt dates and time.

## 2019-06-20 DIAGNOSIS — C8310 Mantle cell lymphoma, unspecified site: Secondary | ICD-10-CM | POA: Insufficient documentation

## 2019-06-20 DIAGNOSIS — C8318 Mantle cell lymphoma, lymph nodes of multiple sites: Secondary | ICD-10-CM | POA: Insufficient documentation

## 2019-06-20 DIAGNOSIS — Z7189 Other specified counseling: Secondary | ICD-10-CM | POA: Insufficient documentation

## 2019-06-20 MED ORDER — ONDANSETRON HCL 8 MG PO TABS: 8.0000 mg | ORAL_TABLET | Freq: Two times a day (BID) | ORAL | 1 refills | Status: DC | PRN

## 2019-06-20 MED ORDER — ALLOPURINOL 100 MG PO TABS: 100.0000 mg | ORAL_TABLET | Freq: Two times a day (BID) | ORAL | 0 refills | Status: DC

## 2019-06-20 MED ORDER — LORAZEPAM 0.5 MG PO TABS: 0.5000 mg | ORAL_TABLET | Freq: Four times a day (QID) | ORAL | 0 refills | Status: DC | PRN

## 2019-06-20 MED ORDER — PROCHLORPERAZINE MALEATE 10 MG PO TABS: 10.0000 mg | ORAL_TABLET | Freq: Four times a day (QID) | ORAL | 1 refills | Status: DC | PRN

## 2019-06-20 MED ORDER — DEXAMETHASONE 4 MG PO TABS: 8.0000 mg | ORAL_TABLET | Freq: Every day | ORAL | 1 refills | Status: DC

## 2019-06-20 MED ORDER — ACYCLOVIR 400 MG PO TABS: 400.0000 mg | ORAL_TABLET | Freq: Every day | ORAL | 3 refills | Status: DC

## 2019-06-20 NOTE — Progress Notes (Signed)
START ON PATHWAY REGIMEN - Lymphoma and CLL     A cycle is every 28 days:     Bendamustine      Rituximab-xxxx   **Always confirm dose/schedule in your pharmacy ordering system**  Patient Characteristics: Mantle Cell Lymphoma, First Line, Aggressive Disease or Treatment Indicated, Stage II - IV, Not a Transplant Candidate Disease Type: Not Applicable Disease Type: Mantle Cell Lymphoma Disease Type: Not Applicable Line of Therapy: First Line [place] Stage: IV Patient Characteristics: Not a Transplant Candidate Intent of Therapy: Non-Curative / Palliative Intent, Discussed with Patient

## 2019-06-21 ENCOUNTER — Inpatient Hospital Stay: Payer: Medicare Other

## 2019-06-22 ENCOUNTER — Telehealth: Payer: Self-pay | Admitting: *Deleted

## 2019-06-22 NOTE — Telephone Encounter (Signed)
Contacted patient to review medications sent to pharmacy by Dr. Irene Limbo 06/20/19. Medications/directions reviewed and patient questions answered. Patient encouraged to contact office for further questions. Patient verbalized understanding

## 2019-06-28 ENCOUNTER — Other Ambulatory Visit: Payer: Self-pay | Admitting: *Deleted

## 2019-06-28 DIAGNOSIS — C8318 Mantle cell lymphoma, lymph nodes of multiple sites: Secondary | ICD-10-CM

## 2019-06-29 ENCOUNTER — Other Ambulatory Visit: Payer: Self-pay

## 2019-06-29 ENCOUNTER — Inpatient Hospital Stay: Payer: Medicare Other

## 2019-06-29 ENCOUNTER — Encounter: Payer: Self-pay | Admitting: Hematology

## 2019-06-29 ENCOUNTER — Inpatient Hospital Stay (HOSPITAL_BASED_OUTPATIENT_CLINIC_OR_DEPARTMENT_OTHER): Payer: Medicare Other | Admitting: Medical

## 2019-06-29 ENCOUNTER — Other Ambulatory Visit: Payer: Self-pay | Admitting: Hematology

## 2019-06-29 ENCOUNTER — Inpatient Hospital Stay: Payer: Medicare Other | Attending: Hematology

## 2019-06-29 VITALS — BP 142/58 | HR 97 | Temp 99.3°F | Resp 16 | Ht 64.0 in | Wt 233.8 lb

## 2019-06-29 DIAGNOSIS — C8310 Mantle cell lymphoma, unspecified site: Secondary | ICD-10-CM | POA: Diagnosis not present

## 2019-06-29 DIAGNOSIS — E119 Type 2 diabetes mellitus without complications: Secondary | ICD-10-CM | POA: Diagnosis not present

## 2019-06-29 DIAGNOSIS — Z8719 Personal history of other diseases of the digestive system: Secondary | ICD-10-CM | POA: Insufficient documentation

## 2019-06-29 DIAGNOSIS — T8090XA Unspecified complication following infusion and therapeutic injection, initial encounter: Secondary | ICD-10-CM

## 2019-06-29 DIAGNOSIS — Z5111 Encounter for antineoplastic chemotherapy: Secondary | ICD-10-CM | POA: Diagnosis not present

## 2019-06-29 DIAGNOSIS — M129 Arthropathy, unspecified: Secondary | ICD-10-CM | POA: Diagnosis not present

## 2019-06-29 DIAGNOSIS — Z79899 Other long term (current) drug therapy: Secondary | ICD-10-CM | POA: Insufficient documentation

## 2019-06-29 DIAGNOSIS — I10 Essential (primary) hypertension: Secondary | ICD-10-CM | POA: Insufficient documentation

## 2019-06-29 DIAGNOSIS — C8318 Mantle cell lymphoma, lymph nodes of multiple sites: Secondary | ICD-10-CM

## 2019-06-29 DIAGNOSIS — Z7189 Other specified counseling: Secondary | ICD-10-CM

## 2019-06-29 LAB — CBC WITH DIFFERENTIAL (CANCER CENTER ONLY)
Abs Immature Granulocytes: 0.3 10*3/uL — ABNORMAL HIGH (ref 0.00–0.07)
Basophils Absolute: 0.3 10*3/uL — ABNORMAL HIGH (ref 0.0–0.1)
Basophils Relative: 1 %
Eosinophils Absolute: 1 10*3/uL — ABNORMAL HIGH (ref 0.0–0.5)
Eosinophils Relative: 3 %
HCT: 39.8 % (ref 39.0–52.0)
Hemoglobin: 12.6 g/dL — ABNORMAL LOW (ref 13.0–17.0)
Lymphocytes Relative: 77 %
Lymphs Abs: 25.3 10*3/uL — ABNORMAL HIGH (ref 0.7–4.0)
MCH: 28.3 pg (ref 26.0–34.0)
MCHC: 31.7 g/dL (ref 30.0–36.0)
MCV: 89.4 fL (ref 80.0–100.0)
Metamyelocytes Relative: 1 %
Monocytes Absolute: 1 10*3/uL (ref 0.1–1.0)
Monocytes Relative: 3 %
Neutro Abs: 4.9 10*3/uL (ref 1.7–7.7)
Neutrophils Relative %: 15 %
Platelet Count: 164 10*3/uL (ref 150–400)
RBC: 4.45 MIL/uL (ref 4.22–5.81)
RDW: 15.1 % (ref 11.5–15.5)
WBC Count: 32.8 10*3/uL — ABNORMAL HIGH (ref 4.0–10.5)
nRBC: 0 % (ref 0.0–0.2)

## 2019-06-29 LAB — CMP (CANCER CENTER ONLY)
ALT: 23 U/L (ref 0–44)
AST: 20 U/L (ref 15–41)
Albumin: 3.9 g/dL (ref 3.5–5.0)
Alkaline Phosphatase: 90 U/L (ref 38–126)
Anion gap: 10 (ref 5–15)
BUN: 15 mg/dL (ref 8–23)
CO2: 24 mmol/L (ref 22–32)
Calcium: 8.5 mg/dL — ABNORMAL LOW (ref 8.9–10.3)
Chloride: 108 mmol/L (ref 98–111)
Creatinine: 0.83 mg/dL (ref 0.61–1.24)
GFR, Est AFR Am: >60 mL/min (ref >60)
GFR, Estimated: >60 mL/min (ref >60)
Glucose, Bld: 149 mg/dL — ABNORMAL HIGH (ref 70–99)
Potassium: 4 mmol/L (ref 3.5–5.1)
Sodium: 142 mmol/L (ref 135–145)
Total Bilirubin: 0.4 mg/dL (ref 0.3–1.2)
Total Protein: 7.1 g/dL (ref 6.5–8.1)

## 2019-06-29 LAB — LACTATE DEHYDROGENASE: LDH: 171 U/L (ref 98–192)

## 2019-06-29 MED ORDER — FAMOTIDINE IN NACL 20-0.9 MG/50ML-% IV SOLN
20.0000 mg | Freq: Once | INTRAVENOUS | Status: AC | PRN
  Administered 2019-06-29: 11:00:00 20 mg via INTRAVENOUS

## 2019-06-29 MED ORDER — DIPHENHYDRAMINE HCL 25 MG PO CAPS
ORAL_CAPSULE | ORAL | Status: AC
  Filled 2019-06-29: qty 2

## 2019-06-29 MED ORDER — ACETAMINOPHEN 325 MG PO TABS
ORAL_TABLET | ORAL | Status: AC
  Filled 2019-06-29: qty 2

## 2019-06-29 MED ORDER — ONDANSETRON HCL 4 MG/2ML IJ SOLN
INTRAMUSCULAR | Status: AC
  Filled 2019-06-29: qty 4

## 2019-06-29 MED ORDER — DEXAMETHASONE SODIUM PHOSPHATE 10 MG/ML IJ SOLN
10.0000 mg | Freq: Once | INTRAMUSCULAR | Status: AC
  Administered 2019-06-29: 10:00:00 10 mg via INTRAVENOUS

## 2019-06-29 MED ORDER — ACETAMINOPHEN 325 MG PO TABS
650.0000 mg | ORAL_TABLET | Freq: Once | ORAL | Status: AC
  Administered 2019-06-29: 10:00:00 650 mg via ORAL

## 2019-06-29 MED ORDER — MEPERIDINE HCL 25 MG/ML IJ SOLN
25.0000 mg | Freq: Once | INTRAMUSCULAR | Status: AC
  Administered 2019-06-29: 25 mg via INTRAVENOUS

## 2019-06-29 MED ORDER — SODIUM CHLORIDE 0.9 % IV SOLN
375.0000 mg/m2 | Freq: Once | INTRAVENOUS | Status: AC
  Administered 2019-06-29: 800 mg via INTRAVENOUS
  Filled 2019-06-29: qty 50

## 2019-06-29 MED ORDER — MEPERIDINE HCL 25 MG/ML IJ SOLN
25.0000 mg | Freq: Once | INTRAMUSCULAR | Status: AC
  Administered 2019-06-29: 12:00:00 25 mg via INTRAVENOUS

## 2019-06-29 MED ORDER — MEPERIDINE HCL 25 MG/ML IJ SOLN
INTRAMUSCULAR | Status: AC
  Filled 2019-06-29: qty 1

## 2019-06-29 MED ORDER — DEXAMETHASONE SODIUM PHOSPHATE 10 MG/ML IJ SOLN
INTRAMUSCULAR | Status: AC
  Filled 2019-06-29: qty 1

## 2019-06-29 MED ORDER — FAMOTIDINE IN NACL 20-0.9 MG/50ML-% IV SOLN
INTRAVENOUS | Status: AC
  Filled 2019-06-29: qty 50

## 2019-06-29 MED ORDER — ONDANSETRON HCL 4 MG/2ML IJ SOLN
8.0000 mg | Freq: Once | INTRAMUSCULAR | Status: AC
  Administered 2019-06-29: 11:00:00 8 mg via INTRAVENOUS

## 2019-06-29 MED ORDER — DIPHENHYDRAMINE HCL 25 MG PO CAPS
50.0000 mg | ORAL_CAPSULE | Freq: Once | ORAL | Status: AC
  Administered 2019-06-29: 50 mg via ORAL

## 2019-06-29 MED ORDER — ACETAMINOPHEN 325 MG PO TABS
650.0000 mg | ORAL_TABLET | Freq: Once | ORAL | Status: AC
  Administered 2019-06-29: 16:00:00 650 mg via ORAL

## 2019-06-29 MED ORDER — DIPHENHYDRAMINE HCL 50 MG/ML IJ SOLN
50.0000 mg | Freq: Once | INTRAMUSCULAR | Status: AC | PRN
  Administered 2019-06-29: 11:00:00 25 mg via INTRAVENOUS

## 2019-06-29 MED ORDER — ONDANSETRON HCL 4 MG/2ML IJ SOLN
8.0000 mg | Freq: Once | INTRAMUSCULAR | Status: AC
  Administered 2019-06-29: 8 mg via INTRAVENOUS

## 2019-06-29 MED ORDER — PALONOSETRON HCL INJECTION 0.25 MG/5ML: 0.2500 mg | Freq: Once | INTRAVENOUS | Status: DC

## 2019-06-29 MED ORDER — FAMOTIDINE IN NACL 20-0.9 MG/50ML-% IV SOLN
20.0000 mg | Freq: Once | INTRAVENOUS | Status: AC
  Administered 2019-06-29: 10:00:00 20 mg via INTRAVENOUS

## 2019-06-29 MED ORDER — SODIUM CHLORIDE 0.9 % IV SOLN
Freq: Once | INTRAVENOUS | Status: DC | PRN
  Filled 2019-06-29: qty 250

## 2019-06-29 MED ORDER — SODIUM CHLORIDE 0.9 % IV SOLN
90.0000 mg/m2 | Freq: Once | INTRAVENOUS | Status: AC
  Administered 2019-06-29: 17:00:00 200 mg via INTRAVENOUS
  Filled 2019-06-29: qty 8

## 2019-06-29 MED ORDER — SODIUM CHLORIDE 0.9 % IV SOLN
Freq: Once | INTRAVENOUS | Status: AC
  Filled 2019-06-29: qty 250

## 2019-06-29 NOTE — Progress Notes (Signed)
Pt got Zofran 8 mg IV Push for nausea (reaction to 1st dose of Ruxience).   Will give Aloxi D2 this cycle Gave additional Zofran 8 mg IVPush for [employer].  Kennith Center, Pharm.D., CPP 06/29/2019@4 :26 PM

## 2019-06-29 NOTE — Progress Notes (Signed)
Met w/ pt to introduce myself as his Arboriculturist.  Pt has 2 insurances so copay assistance shouldn't be needed.  I offered the [place], went over what it covers and gave him the income requirement.  Pt stated he exceeds the requirement so he doesn't qualify for the grant at this time.  I gave him my card in case his situation changes and would like to apply for the grant at a later time.

## 2019-06-29 NOTE — Patient Instructions (Signed)
[facility] Discharge Instructions for Patients Receiving Chemotherapy  Today you received the following chemotherapy agents Rituximab, Bendamustine  To help prevent nausea and vomiting after your treatment, we encourage you to take your nausea medication as directed.   If you develop nausea and vomiting that is not controlled by your nausea medication, call the clinic.   BELOW ARE SYMPTOMS THAT SHOULD BE REPORTED IMMEDIATELY:  *FEVER GREATER THAN 100.5 F  *CHILLS WITH OR WITHOUT FEVER  NAUSEA AND VOMITING THAT IS NOT CONTROLLED WITH YOUR NAUSEA MEDICATION  *UNUSUAL SHORTNESS OF BREATH  *UNUSUAL BRUISING OR BLEEDING  TENDERNESS IN MOUTH AND THROAT WITH OR WITHOUT PRESENCE OF ULCERS  *URINARY PROBLEMS  *BOWEL PROBLEMS  UNUSUAL RASH Items with * indicate a potential emergency and should be followed up as soon as possible.  Feel free to call the clinic should you have any questions or concerns. The clinic phone number is [phone].  Please show the [facility] at check-in to the Emergency Department and triage nurse.  Rituximab injection What is this medicine? RITUXIMAB (ri TUX i mab) is a monoclonal antibody. It is used to treat certain types of cancer like non-Hodgkin lymphoma and chronic lymphocytic leukemia. It is also used to treat rheumatoid arthritis, granulomatosis with polyangiitis (or Wegener's granulomatosis), microscopic polyangiitis, and pemphigus vulgaris. This medicine may be used for other purposes; ask your health care provider or pharmacist if you have questions. COMMON BRAND NAME(S): Rituxan, RUXIENCE What should I tell my health care provider before I take this medicine? They need to know if you have any of these conditions:  heart disease  infection (especially a virus infection such as hepatitis B, chickenpox, cold sores, or herpes)  immune system problems  irregular heartbeat  kidney disease  low blood counts, like low  white cell, platelet, or red cell counts  lung or breathing disease, like asthma  recently received or scheduled to receive a vaccine  an unusual or allergic reaction to rituximab, other medicines, foods, dyes, or preservatives  pregnant or trying to get pregnant  breast-feeding How should I use this medicine? This medicine is for infusion into a vein. It is administered in a hospital or clinic by a specially trained health care professional. A special MedGuide will be given to you by the pharmacist with each prescription and refill. Be sure to read this information carefully each time. Talk to your pediatrician regarding the use of this medicine in children. This medicine is not approved for use in children. Overdosage: If you think you have taken too much of this medicine contact a poison control center or emergency room at once. NOTE: This medicine is only for you. Do not share this medicine with others. What if I miss a dose? It is important not to miss a dose. Call your doctor or health care professional if you are unable to keep an appointment. What may interact with this medicine?  cisplatin  live virus vaccines This list may not describe all possible interactions. Give your health care provider a list of all the medicines, herbs, non-prescription drugs, or dietary supplements you use. Also tell them if you smoke, drink alcohol, or use illegal drugs. Some items may interact with your medicine. What should I watch for while using this medicine? Your condition will be monitored carefully while you are receiving this medicine. You may need blood work done while you are taking this medicine. This medicine can cause serious allergic reactions. To reduce your risk you  may need to take medicine before treatment with this medicine. Take your medicine as directed. In some patients, this medicine may cause a serious brain infection that may cause death. If you have any problems seeing,  thinking, speaking, walking, or standing, tell your healthcare professional right away. If you cannot reach your healthcare professional, urgently seek other source of medical care. Call your doctor or health care professional for advice if you get a fever, chills or sore throat, or other symptoms of a cold or flu. Do not treat yourself. This drug decreases your body's ability to fight infections. Try to avoid being around people who are sick. Do not become pregnant while taking this medicine or for at least 12 months after stopping it. Women should inform their doctor if they wish to become pregnant or think they might be pregnant. There is a potential for serious side effects to an unborn child. Talk to your health care professional or pharmacist for more information. Do not breast-feed an infant while taking this medicine or for at least 6 months after stopping it. What side effects may I notice from receiving this medicine? Side effects that you should report to your doctor or health care professional as soon as possible:  allergic reactions like skin rash, itching or hives; swelling of the face, lips, or tongue  breathing problems  chest pain  changes in vision  diarrhea  headache with fever, neck stiffness, sensitivity to light, nausea, or confusion  fast, irregular heartbeat  loss of memory  low blood counts - this medicine may decrease the number of white blood cells, red blood cells and platelets. You may be at increased risk for infections and bleeding.  mouth sores  problems with balance, talking, or walking  redness, blistering, peeling or loosening of the skin, including inside the mouth  signs of infection - fever or chills, cough, sore throat, pain or difficulty passing urine  signs and symptoms of kidney injury like trouble passing urine or change in the amount of urine  signs and symptoms of liver injury like dark yellow or brown urine; general ill feeling or  flu-like symptoms; light-colored stools; loss of appetite; nausea; right upper belly pain; unusually weak or tired; yellowing of the eyes or skin  signs and symptoms of low blood pressure like dizziness; feeling faint or lightheaded, falls; unusually weak or tired  stomach pain  swelling of the ankles, feet, hands  unusual bleeding or bruising  vomiting Side effects that usually do not require medical attention (report to your doctor or health care professional if they continue or are bothersome):  headache  joint pain  muscle cramps or muscle pain  nausea  tiredness This list may not describe all possible side effects. Call your doctor for medical advice about side effects. You may report side effects to FDA at 1-800-FDA-1088. Where should I keep my medicine? This drug is given in a hospital or clinic and will not be stored at home. NOTE: This sheet is a summary. It may not cover all possible information. If you have questions about this medicine, talk to your doctor, pharmacist, or health care provider.  2020 Elsevier/Gold Standard (2018-07-21 22:01:36)  Bendamustine Injection What is this medicine? BENDAMUSTINE (BEN da MUS teen) is a chemotherapy drug. It is used to treat chronic lymphocytic leukemia and non-Hodgkin lymphoma. This medicine may be used for other purposes; ask your health care provider or pharmacist if you have questions. COMMON BRAND NAME(S): BELRAPZO, BENDEKA, Treanda What should I  tell my health care provider before I take this medicine? They need to know if you have any of these conditions:  infection (especially a virus infection such as chickenpox, cold sores, or herpes)  kidney disease  liver disease  an unusual or allergic reaction to bendamustine, mannitol, other medicines, foods, dyes, or preservatives  pregnant or trying to get pregnant  breast-feeding How should I use this medicine? This medicine is for infusion into a vein. It is given  by a health care professional in a hospital or clinic setting. Talk to your pediatrician regarding the use of this medicine in children. Special care may be needed. Overdosage: If you think you have taken too much of this medicine contact a poison control center or emergency room at once. NOTE: This medicine is only for you. Do not share this medicine with others. What if I miss a dose? It is important not to miss your dose. Call your doctor or health care professional if you are unable to keep an appointment. What may interact with this medicine? Do not take this medicine with any of the following medications:  clozapine This medicine may also interact with the following medications:  atazanavir  cimetidine  ciprofloxacin  enoxacin  fluvoxamine  medicines for seizures like carbamazepine and phenobarbital  mexiletine  rifampin  tacrine  thiabendazole  zileuton This list may not describe all possible interactions. Give your health care provider a list of all the medicines, herbs, non-prescription drugs, or dietary supplements you use. Also tell them if you smoke, drink alcohol, or use illegal drugs. Some items may interact with your medicine. What should I watch for while using this medicine? This drug may make you feel generally unwell. This is not uncommon, as chemotherapy can affect healthy cells as well as cancer cells. Report any side effects. Continue your course of treatment even though you feel ill unless your doctor tells you to stop. You may need blood work done while you are taking this medicine. Call your doctor or healthcare provider for advice if you get a fever, chills or sore throat, or other symptoms of a cold or flu. Do not treat yourself. This drug decreases your body's ability to fight infections. Try to avoid being around people who are sick. This medicine may cause serious skin reactions. They can happen weeks to months after starting the medicine. Contact  your healthcare provider right away if you notice fevers or flu-like symptoms with a rash. The rash may be red or purple and then turn into blisters or peeling of the skin. Or, you might notice a red rash with swelling of the face, lips or lymph nodes in your neck or under your arms. This medicine may increase your risk to bruise or bleed. Call your doctor or healthcare provider if you notice any unusual bleeding. Talk to your doctor about your risk of cancer. You may be more at risk for certain types of cancers if you take this medicine. Do not become pregnant while taking this medicine or for at least 6 months after stopping it. Women should inform their doctor if they wish to become pregnant or think they might be pregnant. Men should not father a child while taking this medicine and for at least 3 months after stopping it. There is a potential for serious side effects to an unborn child. Talk to your healthcare provider or pharmacist for more information. Do not breast-feed an infant while taking this medicine or for at least 1 week  after stopping it. This medicine may make it more difficult to father a child. You should talk with your doctor or healthcare provider if you are concerned about your fertility. What side effects may I notice from receiving this medicine? Side effects that you should report to your doctor or health care professional as soon as possible:  allergic reactions like skin rash, itching or hives, swelling of the face, lips, or tongue  low blood counts - this medicine may decrease the number of white blood cells, red blood cells and platelets. You may be at increased risk for infections and bleeding.  rash, fever, and swollen lymph nodes  redness, blistering, peeling, or loosening of the skin, including inside the mouth  signs of infection like fever or chills, cough, sore throat, pain or difficulty passing urine  signs of decreased platelets or bleeding like bruising,  pinpoint red spots on the skin, black, tarry stools, blood in the urine  signs of decreased red blood cells like being unusually weak or tired, fainting spells, lightheadedness  signs and symptoms of kidney injury like trouble passing urine or change in the amount of urine  signs and symptoms of liver injury like dark yellow or brown urine; general ill feeling or flu-like symptoms; light-colored stools; loss of appetite; nausea; right upper belly pain; unusually weak or tired; yellowing of the eyes or skin Side effects that usually do not require medical attention (report to your doctor or health care professional if they continue or are bothersome):  constipation  decreased appetite  diarrhea  headache  mouth sores  nausea, vomiting  tiredness This list may not describe all possible side effects. Call your doctor for medical advice about side effects. You may report side effects to FDA at 1-800-FDA-1088. Where should I keep my medicine? This drug is given in a hospital or clinic and will not be stored at home. NOTE: This sheet is a summary. It may not cover all possible information. If you have questions about this medicine, talk to your doctor, pharmacist, or health care provider.  2020 Elsevier/Gold Standard (2018-08-31 10:26:46)

## 2019-06-29 NOTE — Progress Notes (Signed)
11:05 Pt reports reports feeling nauseated and feeling "clammy".  Infusion paused and .9 NS hung to gravity. Shelia Media PA called to bedside. Benadryl 25 mg IVP @ 1105. Zofran 8 ml IVP @ 11:09. Pepcid 20 mg IVPB @ 1115.   11:39 Rituximab restarted per V.O. from V Tanner PA  11:40 Pt reports "chills" . Pt having rigors. Infusion paused. Normal Saline restarted V. Tanner called to bedside. Demerol 25 mg IVP ordered.   12:10 Pt states that he is feeling better. VSS  12:15 OK per V Tanner to restart Rituximab    1524 : start reports "feeling cold" pt having rigors. Infusion paused, Normal Saline infusing @ 999 V. Tanner PA notified. Demerol 25 mg ordered. Given per [facility].   1600 : Temp noted to be 102. Sandi Mealy notified. Tylenol given pre MAR.   16:45 Pt continues to tolerate infusion. Report given to T. Nichos RN

## 2019-06-30 ENCOUNTER — Inpatient Hospital Stay: Payer: Medicare Other

## 2019-06-30 ENCOUNTER — Other Ambulatory Visit: Payer: Self-pay

## 2019-06-30 VITALS — BP 135/58 | HR 70 | Temp 97.7°F | Resp 18

## 2019-06-30 DIAGNOSIS — I10 Essential (primary) hypertension: Secondary | ICD-10-CM | POA: Diagnosis not present

## 2019-06-30 DIAGNOSIS — M129 Arthropathy, unspecified: Secondary | ICD-10-CM | POA: Diagnosis not present

## 2019-06-30 DIAGNOSIS — Z5111 Encounter for antineoplastic chemotherapy: Secondary | ICD-10-CM | POA: Diagnosis not present

## 2019-06-30 DIAGNOSIS — Z8719 Personal history of other diseases of the digestive system: Secondary | ICD-10-CM | POA: Diagnosis not present

## 2019-06-30 DIAGNOSIS — C8318 Mantle cell lymphoma, lymph nodes of multiple sites: Secondary | ICD-10-CM

## 2019-06-30 DIAGNOSIS — C8310 Mantle cell lymphoma, unspecified site: Secondary | ICD-10-CM | POA: Diagnosis not present

## 2019-06-30 DIAGNOSIS — Z7189 Other specified counseling: Secondary | ICD-10-CM

## 2019-06-30 DIAGNOSIS — E119 Type 2 diabetes mellitus without complications: Secondary | ICD-10-CM | POA: Diagnosis not present

## 2019-06-30 MED ORDER — SODIUM CHLORIDE 0.9% FLUSH
10.0000 mL | INTRAVENOUS | Status: DC | PRN
  Filled 2019-06-30: qty 10

## 2019-06-30 MED ORDER — PALONOSETRON HCL INJECTION 0.25 MG/5ML
INTRAVENOUS | Status: AC
  Filled 2019-06-30: qty 5

## 2019-06-30 MED ORDER — SODIUM CHLORIDE 0.9 % IV SOLN
90.0000 mg/m2 | Freq: Once | INTRAVENOUS | Status: AC
  Administered 2019-06-30: 11:00:00 200 mg via INTRAVENOUS
  Filled 2019-06-30: qty 8

## 2019-06-30 MED ORDER — PALONOSETRON HCL INJECTION 0.25 MG/5ML
0.2500 mg | Freq: Once | INTRAVENOUS | Status: AC
  Administered 2019-06-30: 0.25 mg via INTRAVENOUS

## 2019-06-30 MED ORDER — DEXAMETHASONE SODIUM PHOSPHATE 10 MG/ML IJ SOLN
INTRAMUSCULAR | Status: AC
  Filled 2019-06-30: qty 1

## 2019-06-30 MED ORDER — SODIUM CHLORIDE 0.9 % IV SOLN
Freq: Once | INTRAVENOUS | Status: AC
  Filled 2019-06-30: qty 250

## 2019-06-30 MED ORDER — DEXAMETHASONE SODIUM PHOSPHATE 10 MG/ML IJ SOLN
10.0000 mg | Freq: Once | INTRAMUSCULAR | Status: AC
  Administered 2019-06-30: 10:00:00 10 mg via INTRAVENOUS

## 2019-06-30 MED ORDER — HEPARIN SOD (PORK) LOCK FLUSH 100 UNIT/ML IV SOLN
500.0000 [IU] | Freq: Once | INTRAVENOUS | Status: DC | PRN
  Filled 2019-06-30: qty 5

## 2019-06-30 NOTE — Patient Instructions (Signed)
[facility] Discharge Instructions for Patients Receiving Chemotherapy  Today you received the following chemotherapy agents: Bendamustine  To help prevent nausea and vomiting after your treatment, we encourage you to take your nausea medication as directed.    If you develop nausea and vomiting that is not controlled by your nausea medication, call the clinic.   BELOW ARE SYMPTOMS THAT SHOULD BE REPORTED IMMEDIATELY:  *FEVER GREATER THAN 100.5 F  *CHILLS WITH OR WITHOUT FEVER  NAUSEA AND VOMITING THAT IS NOT CONTROLLED WITH YOUR NAUSEA MEDICATION  *UNUSUAL SHORTNESS OF BREATH  *UNUSUAL BRUISING OR BLEEDING  TENDERNESS IN MOUTH AND THROAT WITH OR WITHOUT PRESENCE OF ULCERS  *URINARY PROBLEMS  *BOWEL PROBLEMS  UNUSUAL RASH Items with * indicate a potential emergency and should be followed up as soon as possible.  Feel free to call the clinic should you have any questions or concerns. The clinic phone number is [phone].  Please show the CHEMO ALERT CARD at check-in to the Emergency Department and triage nurse.   

## 2019-07-01 ENCOUNTER — Telehealth: Payer: Self-pay | Admitting: *Deleted

## 2019-07-01 NOTE — Progress Notes (Signed)
DATE:  06/29/2019                                          x  CHEMO/IMMUNOTHERAPY REACTION             MD:  Dr. Sullivan Lone   AGENT/BLOOD [place]:               Rituximab   AGENT/BLOOD PRODUCT RECEIVING IMMEDIATELY PRIOR TO REACTION:           Rituximab   VS: BP:      143/68   P:        81       SPO2:        95% on room air T: 97.8                  REACTION(S):          See detailed reactions as outlined below from the nursing note:   11:05 Pt reports reports feeling nauseated and feeling "clammy".  Infusion paused and .9 NS hung to gravity. Shelia Media PA called to bedside. Benadryl 25 mg IVP @ 1105. Zofran 8 ml IVP @ 11:09. Pepcid 20 mg IVPB @ 1115.   11:39 Rituximab restarted per V.O. from V Neymar Dowe PA  11:40 Pt reports "chills" . Pt having rigors. Infusion paused. Normal Saline restarted V. Jahdiel Krol called to bedside. Demerol 25 mg IVP ordered.   12:10 Pt states that he is feeling better. VSS  12:15 OK per V Elvera Almario to restart Rituximab    1524 : start reports "feeling cold" pt having rigors. Infusion paused, Normal Saline infusing @ 999 V. Demetri Kerman PA notified. Demerol 25 mg ordered. Given per [facility].   1600 : Temp noted to be 102. Sandi Mealy notified. Tylenol given pre MAR.   16:45 Pt continues to tolerate infusion. Report given to T. Nichos RN  PREMEDS:       Tylenol, Benadryl, dexamethasone, and Pepcid   INTERVENTION: See above   Review of Systems  Review of Systems  Constitutional: Negative for chills, diaphoresis and fever.  HENT: Negative for trouble swallowing and voice change.   Respiratory: Positive for cough. Negative for chest tightness, shortness of breath and wheezing.   Cardiovascular: Negative for chest pain and palpitations.  Gastrointestinal: Positive for nausea. Negative for abdominal pain, constipation, diarrhea and vomiting.  Musculoskeletal: Negative for back pain and myalgias.  Skin:       Sweaty  Neurological: Negative for  dizziness, light-headedness and headaches.       Left arm tingling     Physical Exam  Physical Exam Constitutional:      General: He is not in acute distress.    Appearance: He is not diaphoretic.  HENT:     Head: Normocephalic and atraumatic.  Cardiovascular:     Rate and Rhythm: Normal rate and regular rhythm.     Heart sounds: Normal heart sounds. No murmur. No friction rub. No gallop.   Pulmonary:     Effort: Pulmonary effort is normal. No respiratory distress.     Breath sounds: Normal breath sounds. No wheezing or rales.  Skin:    General: Skin is warm and dry.     Findings: No rash.     Comments: Patient's face was flushed  Neurological:     Mental Status: He is alert.  Comments: Patient was noted to have rigors     OUTCOME:                 The patient's symptoms resolved and he was able to complete the remainder of his infusion without difficulties.   Sandi Mealy, MHS, PA-C

## 2019-07-05 ENCOUNTER — Other Ambulatory Visit: Payer: Self-pay | Admitting: *Deleted

## 2019-07-05 DIAGNOSIS — C8318 Mantle cell lymphoma, lymph nodes of multiple sites: Secondary | ICD-10-CM

## 2019-07-06 ENCOUNTER — Telehealth: Payer: Self-pay | Admitting: Hematology

## 2019-07-06 ENCOUNTER — Inpatient Hospital Stay (HOSPITAL_BASED_OUTPATIENT_CLINIC_OR_DEPARTMENT_OTHER): Payer: Medicare Other | Admitting: Hematology

## 2019-07-06 ENCOUNTER — Inpatient Hospital Stay: Payer: Medicare Other

## 2019-07-06 ENCOUNTER — Encounter: Payer: Self-pay | Admitting: Hematology

## 2019-07-06 ENCOUNTER — Other Ambulatory Visit: Payer: Self-pay

## 2019-07-06 VITALS — BP 133/51 | HR 77 | Temp 98.0°F | Resp 18 | Ht 64.0 in | Wt 230.8 lb

## 2019-07-06 DIAGNOSIS — C8318 Mantle cell lymphoma, lymph nodes of multiple sites: Secondary | ICD-10-CM | POA: Diagnosis not present

## 2019-07-06 DIAGNOSIS — I10 Essential (primary) hypertension: Secondary | ICD-10-CM | POA: Diagnosis not present

## 2019-07-06 DIAGNOSIS — C8310 Mantle cell lymphoma, unspecified site: Secondary | ICD-10-CM | POA: Diagnosis not present

## 2019-07-06 DIAGNOSIS — Z5111 Encounter for antineoplastic chemotherapy: Secondary | ICD-10-CM | POA: Diagnosis not present

## 2019-07-06 DIAGNOSIS — M129 Arthropathy, unspecified: Secondary | ICD-10-CM | POA: Diagnosis not present

## 2019-07-06 DIAGNOSIS — E119 Type 2 diabetes mellitus without complications: Secondary | ICD-10-CM | POA: Diagnosis not present

## 2019-07-06 DIAGNOSIS — Z8719 Personal history of other diseases of the digestive system: Secondary | ICD-10-CM | POA: Diagnosis not present

## 2019-07-06 LAB — CMP (CANCER CENTER ONLY)
ALT: 32 U/L (ref 0–44)
AST: 16 U/L (ref 15–41)
Albumin: 3.7 g/dL (ref 3.5–5.0)
Alkaline Phosphatase: 82 U/L (ref 38–126)
Anion gap: 10 (ref 5–15)
BUN: 17 mg/dL (ref 8–23)
CO2: 27 mmol/L (ref 22–32)
Calcium: 8.6 mg/dL — ABNORMAL LOW (ref 8.9–10.3)
Chloride: 106 mmol/L (ref 98–111)
Creatinine: 0.96 mg/dL (ref 0.61–1.24)
GFR, Est AFR Am: >60 mL/min (ref >60)
GFR, Estimated: >60 mL/min (ref >60)
Glucose, Bld: 146 mg/dL — ABNORMAL HIGH (ref 70–99)
Potassium: 4.4 mmol/L (ref 3.5–5.1)
Sodium: 143 mmol/L (ref 135–145)
Total Bilirubin: 0.5 mg/dL (ref 0.3–1.2)
Total Protein: 6.5 g/dL (ref 6.5–8.1)

## 2019-07-06 LAB — CBC WITH DIFFERENTIAL (CANCER CENTER ONLY)
Abs Immature Granulocytes: 0.17 10*3/uL — ABNORMAL HIGH (ref 0.00–0.07)
Basophils Absolute: 0.1 10*3/uL (ref 0.0–0.1)
Basophils Relative: 1 %
Eosinophils Absolute: 0.5 10*3/uL (ref 0.0–0.5)
Eosinophils Relative: 4 %
HCT: 38.4 % — ABNORMAL LOW (ref 39.0–52.0)
Hemoglobin: 12.6 g/dL — ABNORMAL LOW (ref 13.0–17.0)
Immature Granulocytes: 1 %
Lymphocytes Relative: 46 %
Lymphs Abs: 6.6 10*3/uL — ABNORMAL HIGH (ref 0.7–4.0)
MCH: 29.2 pg (ref 26.0–34.0)
MCHC: 32.8 g/dL (ref 30.0–36.0)
MCV: 89.1 fL (ref 80.0–100.0)
Monocytes Absolute: 0.8 10*3/uL (ref 0.1–1.0)
Monocytes Relative: 6 %
Neutro Abs: 5.8 10*3/uL (ref 1.7–7.7)
Neutrophils Relative %: 42 %
Platelet Count: 146 10*3/uL — ABNORMAL LOW (ref 150–400)
RBC: 4.31 MIL/uL (ref 4.22–5.81)
RDW: 14.6 % (ref 11.5–15.5)
WBC Count: 14 10*3/uL — ABNORMAL HIGH (ref 4.0–10.5)
nRBC: 0 % (ref 0.0–0.2)

## 2019-07-06 LAB — LACTATE DEHYDROGENASE: LDH: 154 U/L (ref 98–192)

## 2019-07-06 NOTE — Progress Notes (Signed)
HEMATOLOGY/ONCOLOGY CLINIC NOTE  Date of Service: 07/06/2019  Patient Care Team: Hamrick, Lorin Mercy, MD as PCP - General (Family Medicine)  CHIEF COMPLAINTS/PURPOSE OF CONSULTATION:  F/u for recently diagnosed mantle cell lymphoma  HISTORY OF PRESENTING ILLNESS:   Dean Dixon is a wonderful 69 y.o. male who has been referred to [place] by Dr Daiva Eves for evaluation and management of Lymphocytosis. The pt reports that he is doing well overall.   The pt reports that he has had a high lymphocyte count for several years but it has never been as high as now. 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 This most recent lymphocytosis was picked up by him looking at his own blood in the lab. He has not felt any differently in the last 6 months to 1 year. Pt has arthritis, tendonitis and bursitis in his shoulder. He has not had any concerns of RA and no significantly swollen joints. Pt tore an achilles tendon and then flew on a plane which lead to a blood clot. He has been on a testosterone supplement before but has not used it in a while. Pt has had diverticulitis. He has been losing weight slowly by eating better but has not noticed any unexpected weight loss. Pt does not smoke, does not share needles and has not needed any blood transfusions in the past. Pt denies any respiratory illness or infection symptoms in the last few months. He used to have an elevated IgM level about 30 years ago. He does have several fatty lipomas, a few which he has had removed, but there have been no concerns associated with them. He has recently stopped taking herbal supplements until he can figure out what is going on with his blood counts. He has been taking Glucosamine, Chondroitin & MSM as well as a 25 mg CBD gummy and 10 mg of CBD SL for his joint pain for a few years. He is currently taking Glipizide and Metformin. The Metformin did give the pt diarrhea, which has since resolved.   Most recent lab results (04/12/19) of  CBC w/diff and CMP is as follows: all values are WNL except for Glucose at 191, WBC at 30.5K, Lymphs Abs at 19.6K, Mono Abs at 3.6K, Eos Abs at 0.5K, Baso Abs at 0.3K, Abs Immature Granulocytes at 0.3K. 04/12/2019 PSA at 2.2  On review of systems, pt reports joint pain and denies rashes, fevers, chills, night sweats, unexpected weight loss, issues with swallowing, abdominal pain, bowel movement issues, leg pain, respiratory symptoms, diarrhea and any other symptoms.   On PMHx the pt reports HTN, Diabetes, Arthritis, Diverticulitis. On Social Hx the pt reports that he is a non-smoker.  INTERVAL HISTORY:   Dean Dixon is a wonderful 69 y.o. male who is here for evaluation and management of Lymphocytosis. He is here for a toxicity check after C1 of Bendamustine + Rituxan. The patient's last visit with [place] was on 06/14/2019. The pt reports that he is doing well overall.  The pt reports that he began to feel rigors after about an hour and a half of treatment. They went away quicky after the administration of Demerol. He did not experience any more rigors during or after treatment. Pt has continued to take Allopurinol as prescribed. Pt is concerned about the reduction in response to the COVID19 vaccine due to his active treatment.  Lab results today (07/06/19) of CBC w/diff and CMP is as follows: all values are WNL except for WBC at  14.0K, Hgb at 12.6, HCT at 38.4, PLT at 146K, Lymphs Abs 6.6K, Abs Immature Granulocytes at 0.17K, Glucose at 146, Calcium at 8.6. 07/06/2019 LDH at 154  On review of systems, pt denies N/V/D, fevers, chills, night sweats, abdominal pain, leg swelling, constipation and any other symptoms.    MEDICAL HISTORY:  Past Medical History:  Diagnosis Date  . Diabetes mellitus without complication ([place])   . Hypertension     SURGICAL HISTORY: Past Surgical History:  Procedure Laterality Date  . APPENDECTOMY    . TONSILLECTOMY      SOCIAL HISTORY: Social History    Socioeconomic History  . Marital status: Divorced    Spouse name: Not on file  . Number of children: Not on file  . Years of education: Not on file  . Highest education level: Not on file  Occupational History  . Not on file  Tobacco Use  . Smoking status: Never Smoker  . Smokeless tobacco: Never Used  Substance and Sexual Activity  . Alcohol use: Yes    Alcohol/week: 2.0 standard drinks    Types: 2 Standard drinks or equivalent per week  . Drug use: No  . Sexual activity: Not on file  Other Topics Concern  . Not on file  Social History Narrative  . Not on file   Social Determinants of Health   Financial Resource Strain:   . Difficulty of Paying Living Expenses: Not on file  Food Insecurity:   . Worried About Charity fundraiser in the Last Year: Not on file  . Ran Out of Food in the Last Year: Not on file  Transportation Needs:   . Lack of Transportation (Medical): Not on file  . Lack of Transportation (Non-Medical): Not on file  Physical Activity:   . Days of Exercise per Week: Not on file  . Minutes of Exercise per Session: Not on file  Stress:   . Feeling of Stress : Not on file  Social Connections:   . Frequency of Communication with Friends and Family: Not on file  . Frequency of Social Gatherings with Friends and Family: Not on file  . Attends Religious Services: Not on file  . Active Member of Clubs or Organizations: Not on file  . Attends Archivist Meetings: Not on file  . Marital Status: Not on file  Intimate Partner Violence:   . Fear of Current or Ex-Partner: Not on file  . Emotionally Abused: Not on file  . Physically Abused: Not on file  . Sexually Abused: Not on file    FAMILY HISTORY: No family history on file.  ALLERGIES:  is allergic to ivp dye [iodinated diagnostic agents].  MEDICATIONS:  Current Outpatient Medications  Medication Sig Dispense Refill  . acyclovir (ZOVIRAX) 400 MG tablet Take 1 tablet (400 mg total) by mouth  daily. 30 tablet 3  . allopurinol (ZYLOPRIM) 100 MG tablet Take 1 tablet (100 mg total) by mouth 2 (two) times daily. 60 tablet 0  . amLODipine (NORVASC) 10 MG tablet Take 10 mg by mouth daily.    Marland Kitchen aspirin 325 MG tablet Take 325 mg by mouth as needed.    Marland Kitchen CANNABIDIOL PO Take 30 mg by mouth daily.    Marland Kitchen dexamethasone (DECADRON) 4 MG tablet Take 2 tablets (8 mg total) by mouth daily. Start the day after bendamustine chemotherapy for 2 days. Take with food. 30 tablet 1  . Glucosamine-Chondroitin (GLUCOSAMINE CHONDR COMPLEX PO) Take 1 tablet by mouth daily.    Marland Kitchen  LORazepam (ATIVAN) 0.5 MG tablet Take 1 tablet (0.5 mg total) by mouth every 6 (six) hours as needed (Nausea or vomiting). 30 tablet 0  . metFORMIN (GLUCOPHAGE-XR) 500 MG 24 hr tablet Take 500 mg by mouth daily.    . ondansetron (ZOFRAN) 8 MG tablet Take 1 tablet (8 mg total) by mouth 2 (two) times daily as needed for refractory nausea / vomiting. Start on day 2 after bendamustine chemotherapy. 30 tablet 1  . prochlorperazine (COMPAZINE) 10 MG tablet Take 1 tablet (10 mg total) by mouth every 6 (six) hours as needed (Nausea or vomiting). 30 tablet 1  . ramipril (ALTACE) 10 MG capsule Take 10 mg by mouth daily.    Marland Kitchen triamterene-hydrochlorothiazide (MAXZIDE-25) 37.5-25 MG per tablet Take 1 tablet by mouth daily.     No current facility-administered medications for this visit.    REVIEW OF SYSTEMS:   A 10+ POINT REVIEW OF SYSTEMS WAS OBTAINED including neurology, dermatology, psychiatry, cardiac, respiratory, lymph, extremities, GI, GU, Musculoskeletal, constitutional, breasts, reproductive, HEENT.  All pertinent positives are noted in the HPI.  All others are negative.   PHYSICAL EXAMINATION: ECOG PERFORMANCE STATUS: 0 - Asymptomatic  . Vitals:   07/06/19 1506  BP: (!) 133/51  Pulse: 77  Resp: 18  Temp: 98 F (36.7 C)  SpO2: 96%   Filed Weights   07/06/19 1506  Weight: 230 lb 12.8 oz (104.7 kg)   .Body mass index is 39.62  kg/m.  Exam was given in a chair   GENERAL:alert, in no acute distress and comfortable SKIN: no acute rashes, no significant lesions EYES: conjunctiva are pink and non-injected, sclera anicteric OROPHARYNX: MMM, no exudates, no oropharyngeal erythema or ulceration NECK: supple, no JVD LYMPH:  no palpable lymphadenopathy in the cervical, axillary or inguinal regions LUNGS: clear to auscultation b/l with normal respiratory effort HEART: regular rate & rhythm ABDOMEN:  normoactive bowel sounds , non tender, not distended. No palpable hepatosplenomegaly.  Extremity: no pedal edema PSYCH: alert & oriented x 3 with fluent speech NEURO: no focal motor/sensory deficits  LABORATORY DATA:  I have reviewed the data as listed  . CBC Latest Ref Rng & Units 07/06/2019 06/29/2019 06/14/2019  WBC 4.0 - 10.5 K/uL 14.0(H) 32.8(H) 35.8(H)  Hemoglobin 13.0 - 17.0 g/dL 12.6(L) 12.6(L) 12.5(L)  Hematocrit 39.0 - 52.0 % 38.4(L) 39.8 39.0  Platelets 150 - 400 K/uL 146(L) 164 174    . CMP Latest Ref Rng & Units 07/06/2019 06/29/2019 06/14/2019  Glucose 70 - 99 mg/dL 146(H) 149(H) 139(H)  BUN 8 - 23 mg/dL _0 Creatinine 0.61 - 1.24 mg/dL 0.96 0.83 0.86  Sodium 135 - 145 mmol/L 143 142 144  Potassium 3.5 - 5.1 mmol/L 4.4 4.0 4.2  Chloride 98 - 111 mmol/L 106 108 107  CO2 22 - 32 mmol/L _1 Calcium 8.9 - 10.3 mg/dL 8.6(L) 8.5(L) 9.0  Total Protein 6.5 - 8.1 g/dL 6.5 7.1 7.2  Total Bilirubin 0.3 - 1.2 mg/dL 0.5 0.4 0.4  Alkaline Phos 38 - 126 U/L 82 90 95  AST 15 - 41 U/L _2 ALT 0 - 44 U/L 32 23 18   06/10/2019 Flow Pathology Report ([id number]):   06/10/2019 Surgical Pathology ([id number]):   Surgical Pathology  CASE: [id number]  PATIENT: Jerad Tomaso  Flow Pathology Report      Clinical history: Lymphocytosis      DIAGNOSIS:   -Abnormal B-cell population identified.  -See comment   COMMENT:  Analysis of the lymphoid population shows a major abnormal  B-cell  population representing 74% of all lymphocytes with expression of  HLA-Dr, CD20 and CD5. This is associated with extremely dim/negative  staining for surface immunoglobulin light chains hindering assessment  and or quantitation of clonality. No CD10, CD38, or CD34 expression is  identified. The findings are most consistent with a B-cell  lymphoproliferative process. Examination of peripheral blood shows  predominance of medium and large atypical lymphoid cells characterized  by coarse chromatin, moderately abundant agranular blue cytoplasm and  occasional cells with small nucleoli. Consideration should be given to  chronic lymphocytic leukemia variant, mantle cell lymphoma, as well as  other CD5 positive B-cell lymphoproliferative processes. FISH studies  may be helpful in this regard.   GATING AND PHENOTYPIC ANALYSIS:   Gated population: Flow cytometric immunophenotyping is performed using  antibodies to the antigens listed in the table below. Electronic gates  are placed around a cell cluster displaying light scatter properties  corresponding to: lymphocytes   Abnormal Cells in gated population: 74%   Phenotype of Abnormal Cells: CD5, CD20, HLA-Dr    04/26/2019 Flow Cytometry [id number]):    05/02/2019 FISH:    WBC Neutros Lymphs  02/05/2017 9.9K 6.0K 2.6K  10/06/2017 12.6K 7.7K 3.2K  11/02/2018 12.3K 5.2K 5.6K    RADIOGRAPHIC STUDIES: I have personally reviewed the radiological images as listed and agreed with the findings in the report. [place] CORE BIOPSY (LYMPH NODES)  Result Date: 06/10/2019 INDICATION: Mild scattered FDG avid cervical and axillary lymph nodes. Concern for mantle cell lymphoma. EXAM: ULTRASOUND GUIDED CORE BIOPSY OF RIGHT CERVICAL LYMPH NODE MEDICATIONS: 1% LIDOCAINE LOCAL ANESTHESIA/SEDATION: Versed 2.59m IV; Fentanyl 1063m IV; Moderate Sedation Time:  11 minutes The patient was continuously monitored during the procedure by the  interventional radiology nurse under my direct supervision. FLUOROSCOPY TIME:  Fluoroscopy Time: None. COMPLICATIONS: None immediate. PROCEDURE: The procedure, risks, benefits, and alternatives were explained to the patient. Questions regarding the procedure were encouraged and answered. The patient understands and consents to the procedure. The right neck was prepped with ChloraPrep in a sterile fashion, and a sterile drape was applied covering the operative field. A sterile gown and sterile gloves were used for the procedure. Local anesthesia was provided with 1% Lidocaine. Previous imaging reviewed. Preliminary ultrasound performed. A mildly enlarged right cervical lymph node was localized and marked. This was correlated with the PET-CT. Under sterile conditions and local anesthesia, an 18 gauge core biopsy device was advanced to the right cervical lymph node. Several 18 gauge core biopsies obtained. Samples placed in saline. Images obtained for documentation. Patient tolerated biopsy well. No immediate complication. FINDINGS: Imaging confirms needle placement in the right cervical enlarged lymph node for core biopsy IMPRESSION: Successful ultrasound right cervical adenopathy 18 gauge core biopsies Electronically Signed   By: M.Jerilynn Mages Shick M.D.   On: 06/10/2019 15:55    ASSESSMENT & PLAN:   [id number] male with   1) Progressively increasing Lymphocytosis concerning for a lymphoproliferative. CD5+ve lymphoproliferative disorder likely CLL less likely Mantle cell lymphoma CLL FISH shows 17p mutation -04/26/2019 Flow Cytometry ([id number] "Abnormal B-cell population identified. CD5+ clonal lymphoproliferative disorder" -04/26/2019 FISH revealed "Positive for p53 (17p13) deletion." -05/31/2019 PET/CT ([id number] "1. There are multiple prominent lymph nodes in the neck, both axilla, the retroperitoneum and pelvis with low level hypermetabolic activity consistent with chronic lymphocytic  leukemia. [place] 3 and 4. 2. Mild splenomegaly and mild generalized splenic hypermetabolic activity 3. No evidence of bone  marrow involvement." -06/10/2019 Flow Pathology Report ([id number]) revealed "Abnormal B-cell population." -06/10/2019 Surgical Pathology Report ([id number])  revealed "Atypical lymphoid proliferation consistent with mantle cell lymphoma."  PLAN: -Discussed pt labwork today, 07/06/19; Lymphocytes greatly reduced, other blood counts are holding steady, blood chemistries are stable  -Discussed 07/06/2019 LDH is WNL at 154 -The pt has no prohibitive toxicities from continuing C2D1 of Bendamustine + Rituxan at this time  -Will add Demerol as premedication with C2D1 due to rigors with C1D1.  -Recommend pt receive COVID19 vaccine when available  -Will hold off on port placement at this time -Plan to get a rpt PET/CT after 3 cycles -Will see back in 3 weeks with labs    FOLLOW UP: -Please schedule for cycle 2 of bendamustine Rituxan day 1 and day 2.(as ordered) -Labs and MD visit on day 1   The total time spent in the appt was 15 minutes and more than 50% was on counseling and direct patient cares.  All of the patient's questions were answered with apparent satisfaction. The patient knows to call the clinic with any problems, questions or concerns.    Sullivan Lone MD [place] AAHIVMS [facility] Hematology/Oncology Physician [facility]  (Office):       [phone] (Work cell):  [phone] (Fax):           [phone]  07/06/2019 3:13 AM  I, Yevette Edwards, am acting as a scribe for Dr. Sullivan Lone.   .I have reviewed the above documentation for accuracy and completeness, and I agree with the above. Brunetta Genera MD

## 2019-07-06 NOTE — Telephone Encounter (Signed)
Per 1/13 los appts already scheduled

## 2019-07-19 ENCOUNTER — Other Ambulatory Visit: Payer: Self-pay | Admitting: Hematology

## 2019-07-19 DIAGNOSIS — C8318 Mantle cell lymphoma, lymph nodes of multiple sites: Secondary | ICD-10-CM

## 2019-07-19 DIAGNOSIS — Z7189 Other specified counseling: Secondary | ICD-10-CM

## 2019-07-19 NOTE — Telephone Encounter (Signed)
Refill request

## 2019-07-27 ENCOUNTER — Inpatient Hospital Stay (HOSPITAL_BASED_OUTPATIENT_CLINIC_OR_DEPARTMENT_OTHER): Payer: Medicare Other | Admitting: Hematology

## 2019-07-27 ENCOUNTER — Inpatient Hospital Stay: Payer: Medicare Other | Attending: Hematology

## 2019-07-27 ENCOUNTER — Other Ambulatory Visit: Payer: Self-pay

## 2019-07-27 ENCOUNTER — Ambulatory Visit (HOSPITAL_BASED_OUTPATIENT_CLINIC_OR_DEPARTMENT_OTHER): Payer: Medicare Other | Admitting: Medical

## 2019-07-27 ENCOUNTER — Telehealth: Payer: Self-pay | Admitting: Hematology

## 2019-07-27 ENCOUNTER — Inpatient Hospital Stay: Payer: Medicare Other

## 2019-07-27 VITALS — BP 143/79 | HR 84 | Temp 98.2°F | Resp 18 | Ht 64.0 in | Wt 224.4 lb

## 2019-07-27 VITALS — BP 93/48 | HR 78 | Temp 99.0°F | Resp 16

## 2019-07-27 DIAGNOSIS — E119 Type 2 diabetes mellitus without complications: Secondary | ICD-10-CM | POA: Insufficient documentation

## 2019-07-27 DIAGNOSIS — R7989 Other specified abnormal findings of blood chemistry: Secondary | ICD-10-CM | POA: Insufficient documentation

## 2019-07-27 DIAGNOSIS — E1169 Type 2 diabetes mellitus with other specified complication: Secondary | ICD-10-CM | POA: Diagnosis not present

## 2019-07-27 DIAGNOSIS — D72829 Elevated white blood cell count, unspecified: Secondary | ICD-10-CM | POA: Diagnosis not present

## 2019-07-27 DIAGNOSIS — C8310 Mantle cell lymphoma, unspecified site: Secondary | ICD-10-CM | POA: Diagnosis not present

## 2019-07-27 DIAGNOSIS — Z5111 Encounter for antineoplastic chemotherapy: Secondary | ICD-10-CM | POA: Insufficient documentation

## 2019-07-27 DIAGNOSIS — C8318 Mantle cell lymphoma, lymph nodes of multiple sites: Secondary | ICD-10-CM

## 2019-07-27 DIAGNOSIS — I10 Essential (primary) hypertension: Secondary | ICD-10-CM | POA: Insufficient documentation

## 2019-07-27 DIAGNOSIS — R739 Hyperglycemia, unspecified: Secondary | ICD-10-CM

## 2019-07-27 DIAGNOSIS — T8090XA Unspecified complication following infusion and therapeutic injection, initial encounter: Secondary | ICD-10-CM

## 2019-07-27 DIAGNOSIS — Z7189 Other specified counseling: Secondary | ICD-10-CM

## 2019-07-27 DIAGNOSIS — Z79899 Other long term (current) drug therapy: Secondary | ICD-10-CM | POA: Insufficient documentation

## 2019-07-27 LAB — LACTATE DEHYDROGENASE: LDH: 167 U/L (ref 98–192)

## 2019-07-27 LAB — CBC WITH DIFFERENTIAL/PLATELET
Abs Immature Granulocytes: 0.04 10*3/uL (ref 0.00–0.07)
Basophils Absolute: 0.1 10*3/uL (ref 0.0–0.1)
Basophils Relative: 1 %
Eosinophils Absolute: 0.2 10*3/uL (ref 0.0–0.5)
Eosinophils Relative: 3 %
HCT: 40.6 % (ref 39.0–52.0)
Hemoglobin: 13.2 g/dL (ref 13.0–17.0)
Immature Granulocytes: 1 %
Lymphocytes Relative: 45 %
Lymphs Abs: 3.6 10*3/uL (ref 0.7–4.0)
MCH: 28.9 pg (ref 26.0–34.0)
MCHC: 32.5 g/dL (ref 30.0–36.0)
MCV: 88.8 fL (ref 80.0–100.0)
Monocytes Absolute: 0.4 10*3/uL (ref 0.1–1.0)
Monocytes Relative: 6 %
Neutro Abs: 3.5 10*3/uL (ref 1.7–7.7)
Neutrophils Relative %: 44 %
Platelets: 93 10*3/uL — ABNORMAL LOW (ref 150–400)
RBC: 4.57 MIL/uL (ref 4.22–5.81)
RDW: 14.8 % (ref 11.5–15.5)
WBC: 8 10*3/uL (ref 4.0–10.5)
nRBC: 0 % (ref 0.0–0.2)

## 2019-07-27 LAB — CMP (CANCER CENTER ONLY)
ALT: 23 U/L (ref 0–44)
AST: 16 U/L (ref 15–41)
Albumin: 4 g/dL (ref 3.5–5.0)
Alkaline Phosphatase: 103 U/L (ref 38–126)
Anion gap: 9 (ref 5–15)
BUN: 16 mg/dL (ref 8–23)
CO2: 26 mmol/L (ref 22–32)
Calcium: 8.9 mg/dL (ref 8.9–10.3)
Chloride: 103 mmol/L (ref 98–111)
Creatinine: 1.1 mg/dL (ref 0.61–1.24)
GFR, Est AFR Am: >60 mL/min (ref >60)
GFR, Estimated: >60 mL/min (ref >60)
Glucose, Bld: 437 mg/dL — ABNORMAL HIGH (ref 70–99)
Potassium: 4.3 mmol/L (ref 3.5–5.1)
Sodium: 138 mmol/L (ref 135–145)
Total Bilirubin: 0.5 mg/dL (ref 0.3–1.2)
Total Protein: 6.7 g/dL (ref 6.5–8.1)

## 2019-07-27 LAB — GLUCOSE, CAPILLARY
Glucose-Capillary: 400 mg/dL — ABNORMAL HIGH (ref 70–99)
Glucose-Capillary: 394 mg/dL — ABNORMAL HIGH (ref 70–99)

## 2019-07-27 MED ORDER — FAMOTIDINE IN NACL 20-0.9 MG/50ML-% IV SOLN
20.0000 mg | Freq: Once | INTRAVENOUS | Status: AC | PRN
  Administered 2019-07-27: 15:00:00 20 mg via INTRAVENOUS

## 2019-07-27 MED ORDER — MEPERIDINE HCL 25 MG/ML IJ SOLN
25.0000 mg | Freq: Once | INTRAMUSCULAR | Status: AC
  Administered 2019-07-27: 25 mg via INTRAVENOUS

## 2019-07-27 MED ORDER — ACETAMINOPHEN 500 MG PO TABS
ORAL_TABLET | ORAL | Status: AC
  Filled 2019-07-27: qty 2

## 2019-07-27 MED ORDER — SODIUM CHLORIDE 0.9 % IV SOLN
Freq: Once | INTRAVENOUS | Status: DC | PRN
  Filled 2019-07-27: qty 250

## 2019-07-27 MED ORDER — MONTELUKAST SODIUM 10 MG PO TABS
10.0000 mg | ORAL_TABLET | Freq: Once | ORAL | Status: AC
  Administered 2019-07-27: 16:00:00 10 mg via ORAL

## 2019-07-27 MED ORDER — SODIUM CHLORIDE 0.9 % IV SOLN
90.0000 mg/m2 | Freq: Once | INTRAVENOUS | Status: AC
  Administered 2019-07-27: 200 mg via INTRAVENOUS
  Filled 2019-07-27: qty 8

## 2019-07-27 MED ORDER — MONTELUKAST SODIUM 10 MG PO TABS
ORAL_TABLET | ORAL | Status: AC
  Filled 2019-07-27: qty 1

## 2019-07-27 MED ORDER — ACETAMINOPHEN 500 MG PO TABS
1000.0000 mg | ORAL_TABLET | Freq: Once | ORAL | Status: AC
  Administered 2019-07-27: 1000 mg via ORAL

## 2019-07-27 MED ORDER — SODIUM CHLORIDE 0.9 % IV SOLN
Freq: Once | INTRAVENOUS | Status: AC
  Filled 2019-07-27: qty 250

## 2019-07-27 MED ORDER — FAMOTIDINE IN NACL 20-0.9 MG/50ML-% IV SOLN
INTRAVENOUS | Status: AC
  Filled 2019-07-27: qty 50

## 2019-07-27 MED ORDER — DEXAMETHASONE SODIUM PHOSPHATE 10 MG/ML IJ SOLN
10.0000 mg | Freq: Once | INTRAMUSCULAR | Status: AC
  Administered 2019-07-27: 13:00:00 10 mg via INTRAVENOUS

## 2019-07-27 MED ORDER — PROCHLORPERAZINE EDISYLATE 10 MG/2ML IJ SOLN
INTRAMUSCULAR | Status: AC
  Filled 2019-07-27: qty 2

## 2019-07-27 MED ORDER — LORAZEPAM 2 MG/ML IJ SOLN
0.5000 mg | Freq: Once | INTRAMUSCULAR | Status: AC
  Administered 2019-07-27: 0.5 mg via INTRAVENOUS

## 2019-07-27 MED ORDER — PROCHLORPERAZINE EDISYLATE 10 MG/2ML IJ SOLN: 10.0000 mg | Freq: Once | INTRAMUSCULAR | Status: DC

## 2019-07-27 MED ORDER — SODIUM CHLORIDE 0.9 % IV SOLN
375.0000 mg/m2 | Freq: Once | INTRAVENOUS | Status: AC
  Administered 2019-07-27: 800 mg via INTRAVENOUS
  Filled 2019-07-27: qty 30

## 2019-07-27 MED ORDER — MEPERIDINE HCL 25 MG/ML IJ SOLN
INTRAMUSCULAR | Status: AC
  Filled 2019-07-27: qty 1

## 2019-07-27 MED ORDER — LORAZEPAM 2 MG/ML IJ SOLN
INTRAMUSCULAR | Status: AC
  Filled 2019-07-27: qty 1

## 2019-07-27 MED ORDER — ACETAMINOPHEN 325 MG PO TABS
ORAL_TABLET | ORAL | Status: AC
  Filled 2019-07-27: qty 2

## 2019-07-27 MED ORDER — DIPHENHYDRAMINE HCL 50 MG/ML IJ SOLN
INTRAMUSCULAR | Status: AC
  Filled 2019-07-27: qty 1

## 2019-07-27 MED ORDER — FAMOTIDINE IN NACL 20-0.9 MG/50ML-% IV SOLN
20.0000 mg | Freq: Once | INTRAVENOUS | Status: AC
  Administered 2019-07-27: 14:00:00 20 mg via INTRAVENOUS

## 2019-07-27 MED ORDER — INSULIN REGULAR HUMAN 100 UNIT/ML IJ SOLN
INTRAMUSCULAR | Status: AC
  Filled 2019-07-27: qty 1

## 2019-07-27 MED ORDER — PALONOSETRON HCL INJECTION 0.25 MG/5ML
INTRAVENOUS | Status: AC
  Filled 2019-07-27: qty 5

## 2019-07-27 MED ORDER — INSULIN REGULAR HUMAN 100 UNIT/ML IJ SOLN
5.0000 [IU] | Freq: Once | INTRAMUSCULAR | Status: AC
  Administered 2019-07-27: 19:00:00 5 [IU] via SUBCUTANEOUS

## 2019-07-27 MED ORDER — PALONOSETRON HCL INJECTION 0.25 MG/5ML
0.2500 mg | Freq: Once | INTRAVENOUS | Status: AC
  Administered 2019-07-27: 13:00:00 0.25 mg via INTRAVENOUS

## 2019-07-27 MED ORDER — DIPHENHYDRAMINE HCL 50 MG/ML IJ SOLN
50.0000 mg | Freq: Once | INTRAMUSCULAR | Status: AC
  Administered 2019-07-27: 50 mg via INTRAVENOUS

## 2019-07-27 MED ORDER — INSULIN REGULAR HUMAN 100 UNIT/ML IJ SOLN
8.0000 [IU] | Freq: Once | INTRAMUSCULAR | Status: AC
  Administered 2019-07-27: 14:00:00 8 [IU] via SUBCUTANEOUS

## 2019-07-27 MED ORDER — INSULIN REGULAR HUMAN 100 UNIT/ML IJ SOLN
8.0000 [IU] | Freq: Once | INTRAMUSCULAR | Status: AC
  Administered 2019-07-27: 8 [IU] via SUBCUTANEOUS

## 2019-07-27 MED ORDER — DEXAMETHASONE SODIUM PHOSPHATE 10 MG/ML IJ SOLN
INTRAMUSCULAR | Status: AC
  Filled 2019-07-27: qty 1

## 2019-07-27 NOTE — Progress Notes (Signed)
HEMATOLOGY/ONCOLOGY CLINIC NOTE  Date of Service: 07/27/2019  Patient Care Team: Hamrick, Lorin Mercy, MD as PCP - General (Family Medicine)  CHIEF COMPLAINTS/PURPOSE OF CONSULTATION:  F/u for recently diagnosed mantle cell lymphoma  HISTORY OF PRESENTING ILLNESS:   Dean Dixon is a wonderful 69 y.o. male who has been referred to [place] by Dr Daiva Eves for evaluation and management of Lymphocytosis. The pt reports that he is doing well overall.   The pt reports that he has had a high lymphocyte count for several years but it has never been as high as now. 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 This most recent lymphocytosis was picked up by him looking at his own blood in the lab. He has not felt any differently in the last 6 months to 1 year. Pt has arthritis, tendonitis and bursitis in his shoulder. He has not had any concerns of RA and no significantly swollen joints. Pt tore an achilles tendon and then flew on a plane which lead to a blood clot. He has been on a testosterone supplement before but has not used it in a while. Pt has had diverticulitis. He has been losing weight slowly by eating better but has not noticed any unexpected weight loss. Pt does not smoke, does not share needles and has not needed any blood transfusions in the past. Pt denies any respiratory illness or infection symptoms in the last few months. He used to have an elevated IgM level about 30 years ago. He does have several fatty lipomas, a few which he has had removed, but there have been no concerns associated with them. He has recently stopped taking herbal supplements until he can figure out what is going on with his blood counts. He has been taking Glucosamine, Chondroitin & MSM as well as a 25 mg CBD gummy and 10 mg of CBD SL for his joint pain for a few years. He is currently taking Glipizide and Metformin. The Metformin did give the pt diarrhea, which has since resolved.   Most recent lab results (04/12/19) of CBC  w/diff and CMP is as follows: all values are WNL except for Glucose at 191, WBC at 30.5K, Lymphs Abs at 19.6K, Mono Abs at 3.6K, Eos Abs at 0.5K, Baso Abs at 0.3K, Abs Immature Granulocytes at 0.3K. 04/12/2019 PSA at 2.2  On review of systems, pt reports joint pain and denies rashes, fevers, chills, night sweats, unexpected weight loss, issues with swallowing, abdominal pain, bowel movement issues, leg pain, respiratory symptoms, diarrhea and any other symptoms.   On PMHx the pt reports HTN, Diabetes, Arthritis, Diverticulitis. On Social Hx the pt reports that he is a non-smoker.  INTERVAL HISTORY:   Dean Dixon is a wonderful 69 y.o. male who is here for evaluation and management of Lymphocytosis. He is here for C2 of Bendamustine + Rituxan. The patient's last visit with [place] was on 07/06/2019. The pt reports that he is doing well overall.  The pt reports that he had rigors with his last treatment and had no other bothers. Pt's blood glucose levels have been running high at home at well. They have been running between 130-150 fasting. Pt was taking Glipizide but discontinued last month as he was worried about medication interactions. Pt has an upcoming appointment with his PCP, Dr. Lisbeth Ply.   Nearly two weeks ago pt was running a 99-100F fever in the evenings. He believes this was a case of diverticulitis. He has had no fever  since.   Pt had his first dose of the COVID19 vaccine about two weeks ago and is scheduled for the second dose on 02/17. Pt is still working a fairly regular work schedule.   Lab results today (07/27/19) of CBC w/diff and CMP is as follows: all values are WNL except for PLT at 93K, Glucose at 437. 07/27/2019 LDH at 167  On review of systems, pt reports upper back soreness and denies fevers, chills, nausea, constipation, mouth sores, abdominal pain and any other symptoms.   MEDICAL HISTORY:  Past Medical History:  Diagnosis Date  . Diabetes mellitus without  complication ([place])   . Hypertension     SURGICAL HISTORY: Past Surgical History:  Procedure Laterality Date  . APPENDECTOMY    . TONSILLECTOMY      SOCIAL HISTORY: Social History   Socioeconomic History  . Marital status: Divorced    Spouse name: Not on file  . Number of children: Not on file  . Years of education: Not on file  . Highest education level: Not on file  Occupational History  . Not on file  Tobacco Use  . Smoking status: Never Smoker  . Smokeless tobacco: Never Used  Substance and Sexual Activity  . Alcohol use: Yes    Alcohol/week: 2.0 standard drinks    Types: 2 Standard drinks or equivalent per week  . Drug use: No  . Sexual activity: Not on file  Other Topics Concern  . Not on file  Social History Narrative  . Not on file   Social Determinants of Health   Financial Resource Strain:   . Difficulty of Paying Living Expenses: Not on file  Food Insecurity:   . Worried About Charity fundraiser in the Last Year: Not on file  . Ran Out of Food in the Last Year: Not on file  Transportation Needs:   . Lack of Transportation (Medical): Not on file  . Lack of Transportation (Non-Medical): Not on file  Physical Activity:   . Days of Exercise per Week: Not on file  . Minutes of Exercise per Session: Not on file  Stress:   . Feeling of Stress : Not on file  Social Connections:   . Frequency of Communication with Friends and Family: Not on file  . Frequency of Social Gatherings with Friends and Family: Not on file  . Attends Religious Services: Not on file  . Active Member of Clubs or Organizations: Not on file  . Attends Archivist Meetings: Not on file  . Marital Status: Not on file  Intimate Partner Violence:   . Fear of Current or Ex-Partner: Not on file  . Emotionally Abused: Not on file  . Physically Abused: Not on file  . Sexually Abused: Not on file    FAMILY HISTORY: No family history on file.  ALLERGIES:  is allergic to ivp  dye [iodinated diagnostic agents].  MEDICATIONS:  Current Outpatient Medications  Medication Sig Dispense Refill  . acyclovir (ZOVIRAX) 400 MG tablet Take 1 tablet (400 mg total) by mouth daily. 30 tablet 3  . allopurinol (ZYLOPRIM) 100 MG tablet Take 1 tablet (100 mg total) by mouth 2 (two) times daily. 60 tablet 0  . amLODipine (NORVASC) 10 MG tablet Take 10 mg by mouth daily.    Marland Kitchen aspirin 325 MG tablet Take 325 mg by mouth as needed.    Marland Kitchen CANNABIDIOL PO Take 30 mg by mouth daily.    Marland Kitchen dexamethasone (DECADRON) 4 MG tablet Take  2 tablets (8 mg total) by mouth daily. Start the day after bendamustine chemotherapy for 2 days. Take with food. 30 tablet 1  . Glucosamine-Chondroitin (GLUCOSAMINE CHONDR COMPLEX PO) Take 1 tablet by mouth daily.    Marland Kitchen LORazepam (ATIVAN) 0.5 MG tablet Take 1 tablet (0.5 mg total) by mouth every 6 (six) hours as needed (Nausea or vomiting). 30 tablet 0  . metFORMIN (GLUCOPHAGE-XR) 500 MG 24 hr tablet Take 500 mg by mouth daily.    . ondansetron (ZOFRAN) 8 MG tablet Take 1 tablet (8 mg total) by mouth 2 (two) times daily as needed for refractory nausea / vomiting. Start on day 2 after bendamustine chemotherapy. 30 tablet 1  . prochlorperazine (COMPAZINE) 10 MG tablet Take 1 tablet (10 mg total) by mouth every 6 (six) hours as needed (Nausea or vomiting). 30 tablet 1  . ramipril (ALTACE) 10 MG capsule Take 10 mg by mouth daily.    Marland Kitchen triamterene-hydrochlorothiazide (MAXZIDE-25) 37.5-25 MG per tablet Take 1 tablet by mouth daily.     No current facility-administered medications for this visit.    REVIEW OF SYSTEMS:   A 10+ POINT REVIEW OF SYSTEMS WAS OBTAINED including neurology, dermatology, psychiatry, cardiac, respiratory, lymph, extremities, GI, GU, Musculoskeletal, constitutional, breasts, reproductive, HEENT.  All pertinent positives are noted in the HPI.  All others are negative.   PHYSICAL EXAMINATION: ECOG PERFORMANCE STATUS: 0 - Asymptomatic  . Vitals:    07/27/19 1201  BP: (!) 143/79  Pulse: 84  Resp: 18  Temp: 98.2 F (36.8 C)  SpO2: 96%   Filed Weights   07/27/19 1201  Weight: 224 lb 6.4 oz (101.8 kg)   .Body mass index is 38.52 kg/m.   GENERAL:alert, in no acute distress and comfortable SKIN: no acute rashes, no significant lesions EYES: conjunctiva are pink and non-injected, sclera anicteric OROPHARYNX: MMM, no exudates, no oropharyngeal erythema or ulceration NECK: supple, no JVD LYMPH:  no palpable lymphadenopathy in the cervical, axillary or inguinal regions LUNGS: clear to auscultation b/l with normal respiratory effort HEART: regular rate & rhythm ABDOMEN:  normoactive bowel sounds , non tender, not distended. No palpable hepatosplenomegaly.  Extremity: no pedal edema PSYCH: alert & oriented x 3 with fluent speech NEURO: no focal motor/sensory deficits  LABORATORY DATA:  I have reviewed the data as listed  . CBC Latest Ref Rng & Units 07/27/2019 07/06/2019 06/29/2019  WBC 4.0 - 10.5 K/uL 8.0 14.0(H) 32.8(H)  Hemoglobin 13.0 - 17.0 g/dL 13.2 12.6(L) 12.6(L)  Hematocrit 39.0 - 52.0 % 40.6 38.4(L) 39.8  Platelets 150 - 400 K/uL 93(L) 146(L) 164    . CMP Latest Ref Rng & Units 07/27/2019 07/06/2019 06/29/2019  Glucose 70 - 99 mg/dL 437(H) 146(H) 149(H)  BUN 8 - 23 mg/dL '16 17 15  '$ Creatinine 0.61 - 1.24 mg/dL 1.10 0.96 0.83  Sodium 135 - 145 mmol/L 138 143 142  Potassium 3.5 - 5.1 mmol/L 4.3 4.4 4.0  Chloride 98 - 111 mmol/L 103 106 108  CO2 22 - 32 mmol/L '26 27 24  '$ Calcium 8.9 - 10.3 mg/dL 8.9 8.6(L) 8.5(L)  Total Protein 6.5 - 8.1 g/dL 6.7 6.5 7.1  Total Bilirubin 0.3 - 1.2 mg/dL 0.5 0.5 0.4  Alkaline Phos 38 - 126 U/L 103 82 90  AST 15 - 41 U/L '16 16 20  '$ ALT 0 - 44 U/L 23 32 23   06/10/2019 Flow Pathology Report ([id number]):   06/10/2019 Surgical Pathology ([id number]):   Surgical Pathology  CASE: [id number]  PATIENT: Tasean  Lorensen  Flow Pathology Report      Clinical history: Lymphocytosis        DIAGNOSIS:   -Abnormal B-cell population identified.  -See comment   COMMENT:   Analysis of the lymphoid population shows a major abnormal B-cell  population representing 74% of all lymphocytes with expression of  HLA-Dr, CD20 and CD5. This is associated with extremely dim/negative  staining for surface immunoglobulin light chains hindering assessment  and or quantitation of clonality. No CD10, CD38, or CD34 expression is  identified. The findings are most consistent with a B-cell  lymphoproliferative process. Examination of peripheral blood shows  predominance of medium and large atypical lymphoid cells characterized  by coarse chromatin, moderately abundant agranular blue cytoplasm and  occasional cells with small nucleoli. Consideration should be given to  chronic lymphocytic leukemia variant, mantle cell lymphoma, as well as  other CD5 positive B-cell lymphoproliferative processes. FISH studies  may be helpful in this regard.   GATING AND PHENOTYPIC ANALYSIS:   Gated population: Flow cytometric immunophenotyping is performed using  antibodies to the antigens listed in the table below. Electronic gates  are placed around a cell cluster displaying light scatter properties  corresponding to: lymphocytes   Abnormal Cells in gated population: 74%   Phenotype of Abnormal Cells: CD5, CD20, HLA-Dr    04/26/2019 Flow Cytometry [phone]):    05/02/2019 FISH:    WBC Neutros Lymphs  02/05/2017 9.9K 6.0K 2.6K  10/06/2017 12.6K 7.7K 3.2K  11/02/2018 12.3K 5.2K 5.6K    RADIOGRAPHIC STUDIES: I have personally reviewed the radiological images as listed and agreed with the findings in the report. No results found.  ASSESSMENT & PLAN:   69 yo male with   1) Progressively increasing Lymphocytosis concerning for a lymphoproliferative. CD5+ve lymphoproliferative disorder likely CLL less likely Mantle cell lymphoma CLL FISH shows 17p mutation -04/26/2019 Flow  Cytometry [phone]) revealed "Abnormal B-cell population identified. CD5+ clonal lymphoproliferative disorder" -04/26/2019 FISH revealed "Positive for p53 (17p13) deletion." -05/31/2019 PET/CT ([id number]) revealed "1. There are multiple prominent lymph nodes in the neck, both axilla, the retroperitoneum and pelvis with low level hypermetabolic activity consistent with chronic lymphocytic leukemia. [place] 3 and 4. 2. Mild splenomegaly and mild generalized splenic hypermetabolic activity 3. No evidence of bone marrow involvement." -06/10/2019 Flow Pathology Report ([id number]) revealed "Abnormal B-cell population." -06/10/2019 Surgical Pathology Report ([id number])  revealed "Atypical lymphoid proliferation consistent with mantle cell lymphoma."  PLAN: -Discussed pt labwork today, 07/27/19; WBC have normalized, Lymphs have normalized, Hgb has improved, PLT mildly low, Glucose is severely elevated -Discussed 07/27/2019 LDH is WNL at 167 -Will hold Allopurinol at this time, WBC are WNL and risk of tumor lysis is much less  -The pt has no prohibitive toxicities from continuing C2D1 of Bendamustine + Rituxan at this time  -Will add Demerol as premedication with C2D1 due to rigors with C1D1. Will give Benadryl IV.  -Plan to get a rpt PET/CT after 3 cycles -Advised pt take 1 tablet of Dexamethasone for two days after treatment  -Advised pt to take Glipizide as prescribed  -Will recheck blood glucose prior to treatment, if still elevated will give insulin  -Recommend pt f/u with second dose of COVID19 vaccine as scheduled -Advised pt to contact if he has a sustained fever -Recommend pt f/u with Dr. Lisbeth Ply for Diabetes management -Will see back in 1 month with labs   FOLLOW UP: Plz schedule C3 of Bendamustine and Rituxan , labs and MD visit in 4 weeks   The total  time spent in the appt was 30 minutes and more than 50% was on counseling and direct patient cares.  All of the  patient's questions were answered with apparent satisfaction. The patient knows to call the clinic with any problems, questions or concerns.    Sullivan Lone MD [place] AAHIVMS [facility] Hematology/Oncology Physician [facility]  (Office):       [phone] (Work cell):  [phone] (Fax):           [phone]  07/27/2019 12:32 PM  I, Yevette Edwards, am acting as a scribe for Dr. Sullivan Lone.   .I have reviewed the above documentation for accuracy and completeness, and I agree with the above.  Brunetta Genera MD

## 2019-07-27 NOTE — Telephone Encounter (Signed)
Appointments already scheduled per 02/03 los during check out. 

## 2019-07-27 NOTE — Progress Notes (Signed)
Per MD orders CBG checked - 400.  MD notified.  Verbal orders given for 8units of Reg Insulin SQ X 1 - Recheck CBG in 2.5 hours.

## 2019-07-27 NOTE — Patient Instructions (Signed)
[place] Discharge Instructions for Patients Receiving Chemotherapy  Today you received the following chemotherapy agents Rituximab, Bendamustine  To help prevent nausea and vomiting after your treatment, we encourage you to take your nausea medication as directed.   If you develop nausea and vomiting that is not controlled by your nausea medication, call the clinic.   BELOW ARE SYMPTOMS THAT SHOULD BE REPORTED IMMEDIATELY:  *FEVER GREATER THAN 100.5 F  *CHILLS WITH OR WITHOUT FEVER  NAUSEA AND VOMITING THAT IS NOT CONTROLLED WITH YOUR NAUSEA MEDICATION  *UNUSUAL SHORTNESS OF BREATH  *UNUSUAL BRUISING OR BLEEDING  TENDERNESS IN MOUTH AND THROAT WITH OR WITHOUT PRESENCE OF ULCERS  *URINARY PROBLEMS  *BOWEL PROBLEMS  UNUSUAL RASH Items with * indicate a potential emergency and should be followed up as soon as possible.  Feel free to call the clinic should you have any questions or concerns. The clinic phone number is [phone].  Please show the [place] at check-in to the Emergency Department and triage nurse.

## 2019-07-27 NOTE — Progress Notes (Signed)
@   1436 patient c/o shortness of breath and cough.  RN stopped infusion, initiate hypersensitivity protocol.  VS obtained per MAR.  Sandi Mealy, PA-C at bedside.  Pepcid IVPB administered.  O2 applied via  @ 2L.  Pt responded to Pepcid and O2 well.  Per Sandi Mealy, resume infusion 10 min after Pepcid completion.   Rituxan resumed at 1503.  Pt tolerating well, will continue to monitor.    Pt presenting with rigors and cough @ 1530 - VS obtained per MAR.  Sandi Mealy, PA-C notified.  Verbal orders for Demerol 25mg .  Dr. Irene Limbo presented at bedside.  Additional orders for Ativan 5mg  IVP, may repeat in 10 min if needed and Singular 10mg  tablet PO given verbally. Ativan 0.16mL given with Singular 10mg  tablet.  Pt with no rigors after medication administration.  Per Dr. Irene Limbo re-challenge at same rate.  Rituxan restarted at 1551.  Will continue to monitor.    Recheck CBG per MD orders. CBG 394 - MD notified -  Verbal Orders for Regular Insulin 8 units.  Pt c/o nausea at end of Bendamustine infusion.  VS obtained per MAR - hypotension, Sandi Mealy PA-C verbal orders for 250 cc of NS @ 999.  CBG obtained 430mg /dl - Verbal orders for 5units of Reg Insulin given.    Sandi Mealy, PA educated patient on s/s of hypoglycemia and encouraged patient to check blood sugar at home.  VS upon discharger per MAR.  Patient assisted to vehicle in wheelchair.  Pt has person driving, RN encouraged patient to have driver tomorrow as well.  Verbalized understanding.

## 2019-07-28 ENCOUNTER — Inpatient Hospital Stay: Payer: Medicare Other

## 2019-07-28 ENCOUNTER — Other Ambulatory Visit: Payer: Self-pay

## 2019-07-28 VITALS — BP 107/55 | HR 74 | Temp 98.1°F | Resp 16

## 2019-07-28 DIAGNOSIS — E119 Type 2 diabetes mellitus without complications: Secondary | ICD-10-CM | POA: Diagnosis not present

## 2019-07-28 DIAGNOSIS — C8318 Mantle cell lymphoma, lymph nodes of multiple sites: Secondary | ICD-10-CM

## 2019-07-28 DIAGNOSIS — Z7189 Other specified counseling: Secondary | ICD-10-CM

## 2019-07-28 DIAGNOSIS — D72829 Elevated white blood cell count, unspecified: Secondary | ICD-10-CM | POA: Diagnosis not present

## 2019-07-28 DIAGNOSIS — R7989 Other specified abnormal findings of blood chemistry: Secondary | ICD-10-CM | POA: Diagnosis not present

## 2019-07-28 DIAGNOSIS — Z5111 Encounter for antineoplastic chemotherapy: Secondary | ICD-10-CM | POA: Diagnosis not present

## 2019-07-28 DIAGNOSIS — C8310 Mantle cell lymphoma, unspecified site: Secondary | ICD-10-CM | POA: Diagnosis not present

## 2019-07-28 DIAGNOSIS — I10 Essential (primary) hypertension: Secondary | ICD-10-CM | POA: Diagnosis not present

## 2019-07-28 LAB — GLUCOSE, CAPILLARY: Glucose-Capillary: 430 mg/dL — ABNORMAL HIGH (ref 70–99)

## 2019-07-28 MED ORDER — SODIUM CHLORIDE 0.9 % IV SOLN
400.0000 mg | Freq: Once | INTRAVENOUS | Status: AC
  Administered 2019-07-28: 400 mg via INTRAVENOUS
  Filled 2019-07-28: qty 40

## 2019-07-28 MED ORDER — ACETAMINOPHEN 500 MG PO TABS
1000.0000 mg | ORAL_TABLET | Freq: Once | ORAL | Status: AC
  Administered 2019-07-28: 1000 mg via ORAL

## 2019-07-28 MED ORDER — DEXAMETHASONE SODIUM PHOSPHATE 10 MG/ML IJ SOLN: 10.0000 mg | Freq: Once | INTRAMUSCULAR | Status: DC

## 2019-07-28 MED ORDER — SODIUM CHLORIDE 0.9 % IV SOLN
90.0000 mg/m2 | Freq: Once | INTRAVENOUS | Status: AC
  Administered 2019-07-28: 200 mg via INTRAVENOUS
  Filled 2019-07-28: qty 8

## 2019-07-28 MED ORDER — METHYLPREDNISOLONE SODIUM SUCC 125 MG IJ SOLR
INTRAMUSCULAR | Status: AC
  Filled 2019-07-28: qty 2

## 2019-07-28 MED ORDER — ACETAMINOPHEN 500 MG PO TABS
ORAL_TABLET | ORAL | Status: AC
  Filled 2019-07-28: qty 2

## 2019-07-28 MED ORDER — METHYLPREDNISOLONE SODIUM SUCC 125 MG IJ SOLR
80.0000 mg | Freq: Once | INTRAMUSCULAR | Status: AC
  Administered 2019-07-28: 80 mg via INTRAVENOUS

## 2019-07-28 MED ORDER — MEPERIDINE HCL 50 MG/ML IJ SOLN
INTRAMUSCULAR | Status: AC
  Filled 2019-07-28: qty 1

## 2019-07-28 MED ORDER — MONTELUKAST SODIUM 10 MG PO TABS
10.0000 mg | ORAL_TABLET | Freq: Once | ORAL | Status: AC
  Administered 2019-07-28: 10 mg via ORAL

## 2019-07-28 MED ORDER — FAMOTIDINE IN NACL 20-0.9 MG/50ML-% IV SOLN
INTRAVENOUS | Status: AC
  Filled 2019-07-28: qty 50

## 2019-07-28 MED ORDER — FAMOTIDINE IN NACL 20-0.9 MG/50ML-% IV SOLN
20.0000 mg | Freq: Once | INTRAVENOUS | Status: AC
  Administered 2019-07-28: 20 mg via INTRAVENOUS

## 2019-07-28 MED ORDER — MONTELUKAST SODIUM 10 MG PO TABS
ORAL_TABLET | ORAL | Status: AC
  Filled 2019-07-28: qty 1

## 2019-07-28 MED ORDER — DIPHENHYDRAMINE HCL 50 MG/ML IJ SOLN
INTRAMUSCULAR | Status: AC
  Filled 2019-07-28: qty 1

## 2019-07-28 MED ORDER — DIPHENHYDRAMINE HCL 50 MG/ML IJ SOLN
50.0000 mg | Freq: Once | INTRAMUSCULAR | Status: AC
  Administered 2019-07-28: 50 mg via INTRAVENOUS

## 2019-07-28 MED ORDER — SODIUM CHLORIDE 0.9 % IV SOLN
Freq: Once | INTRAVENOUS | Status: AC
  Filled 2019-07-28: qty 250

## 2019-07-28 MED ORDER — MEPERIDINE HCL 50 MG/ML IJ SOLN
50.0000 mg | Freq: Once | INTRAMUSCULAR | Status: AC
  Administered 2019-07-28: 50 mg via INTRAVENOUS

## 2019-07-28 NOTE — Patient Instructions (Signed)
[place] Discharge Instructions for Patients Receiving Chemotherapy  Today you received the following chemotherapy agents :  Rituximab,  Bendamustine.  To help prevent nausea and vomiting after your treatment, we encourage you to take your nausea medication as prescribed.   If you develop nausea and vomiting that is not controlled by your nausea medication, call the clinic.   BELOW ARE SYMPTOMS THAT SHOULD BE REPORTED IMMEDIATELY:  *FEVER GREATER THAN 100.5 F  *CHILLS WITH OR WITHOUT FEVER  NAUSEA AND VOMITING THAT IS NOT CONTROLLED WITH YOUR NAUSEA MEDICATION  *UNUSUAL SHORTNESS OF BREATH  *UNUSUAL BRUISING OR BLEEDING  TENDERNESS IN MOUTH AND THROAT WITH OR WITHOUT PRESENCE OF ULCERS  *URINARY PROBLEMS  *BOWEL PROBLEMS  UNUSUAL RASH Items with * indicate a potential emergency and should be followed up as soon as possible.  Feel free to call the clinic should you have any questions or concerns. The clinic phone number is [phone].  Please show the [place] at check-in to the Emergency Department and triage nurse.

## 2019-07-28 NOTE — Progress Notes (Signed)
Patient did not complete rituximab infusion on 07/27/19 due to infusion-related reactions. Per Dr. Irene Limbo, he is to receive the remainder of his infusion today. He has approximately 192 mL remaining in his bag on 07/27/19 (~465 mg), so he will need approximately that amount today. Premedications will be redosed per Dr. Irene Limbo.   Demetrius Charity, PharmD, [place] Oncology Pharmacist Pharmacy Phone: [phone] 07/28/2019

## 2019-07-28 NOTE — Progress Notes (Signed)
Patient did not complete rituximab infusion on 07/27/19 due to infusion-related reactions. Per Dr. Irene Limbo, he is to receive the remainder of his infusion today. He has approximately 192 mL remaining in his bag on 07/27/19 (~465 mg), so he will need approximately that amount today. Premedications will be redosed per Dr. Irene Limbo.   Demetrius Charity, PharmD, [place]  Oncology Pharmacist  Pharmacy Phone: [phone]

## 2019-07-29 NOTE — Progress Notes (Signed)
DATE:  07/27/2019                                          X  CHEMO/IMMUNOTHERAPY REACTION            MD:  Dr. Sullivan Lone   AGENT/BLOOD [place]:               Rituximab and [place]   AGENT/BLOOD PRODUCT RECEIVING IMMEDIATELY PRIOR TO REACTION:           Rituximab   VS: BP:      128/64   P:        82       SPO2:        91% on room air                  REACTION(S):            Facial erythema and shortness of breath   PREMEDS:      Tylenol 1000 mg p.o., Benadryl 50 mg, dexamethasone 10 mg, Pepcid 20 mg, and Demerol 25 mg   INTERVENTION: Pepcid 20 mg IV x1, Demerol 25 mg IV x1, and Ativan 0.5 mg IV x1   Review of Systems  Review of Systems  Constitutional: Negative for chills, diaphoresis and fever.  HENT: Negative for trouble swallowing and voice change.   Respiratory: Positive for shortness of breath. Negative for cough, chest tightness and wheezing.   Cardiovascular: Negative for chest pain and palpitations.  Gastrointestinal: Negative for abdominal pain, constipation, diarrhea, nausea and vomiting.  Musculoskeletal: Negative for back pain and myalgias.  Skin:       Facial erythema  Neurological: Negative for dizziness, light-headedness and headaches.     Physical Exam  Physical Exam Constitutional:      General: He is not in acute distress.    Appearance: He is not diaphoretic.  HENT:     Head: Normocephalic and atraumatic.  Cardiovascular:     Rate and Rhythm: Normal rate and regular rhythm.     Heart sounds: Normal heart sounds. No murmur. No friction rub. No gallop.   Pulmonary:     Effort: Pulmonary effort is normal. No respiratory distress.     Breath sounds: Normal breath sounds. No wheezing or rales.  Skin:    General: Skin is warm and dry.     Findings: Erythema present. No rash.     Comments: The patient's face and neck was erythematous.  Neurological:     Mental Status: He is alert.     OUTCOME:                 The patient's  symptoms abated after he was given Pepcid 20 mg IV x1, Demerol 25 mg IV x1, and Ativan 0.5 mg IV x1.  He was able to restart rituximab which was followed by Verl Dicker.  See pharmacy note below.  Patient did not complete rituximab infusion on 07/27/19 due to infusion-related reactions. Per Dr. Irene Limbo, he is to receive the remainder of his infusion today. He has approximately 192 mL remaining in his bag on 07/27/19 (~465 mg), so he will need approximately that amount today. Premedications will be redosed per Dr. Irene Limbo.   Demetrius Charity, PharmD, BCPS  Oncology Pharmacist    Sandi Mealy, MHS, PA-C  This patient was seen with Dr. Irene Limbo with my treatment plan  reviewed with him. He expressed agreement with my medical management of this patient.

## 2019-08-08 DIAGNOSIS — Z9181 History of falling: Secondary | ICD-10-CM | POA: Diagnosis not present

## 2019-08-08 DIAGNOSIS — Z6837 Body mass index (BMI) 37.0-37.9, adult: Secondary | ICD-10-CM | POA: Diagnosis not present

## 2019-08-08 DIAGNOSIS — Z0000 Encounter for general adult medical examination without abnormal findings: Secondary | ICD-10-CM | POA: Diagnosis not present

## 2019-08-08 DIAGNOSIS — E1165 Type 2 diabetes mellitus with hyperglycemia: Secondary | ICD-10-CM | POA: Diagnosis not present

## 2019-08-08 DIAGNOSIS — Z1331 Encounter for screening for depression: Secondary | ICD-10-CM | POA: Diagnosis not present

## 2019-08-08 DIAGNOSIS — E785 Hyperlipidemia, unspecified: Secondary | ICD-10-CM | POA: Diagnosis not present

## 2019-08-08 DIAGNOSIS — E782 Mixed hyperlipidemia: Secondary | ICD-10-CM | POA: Diagnosis not present

## 2019-08-08 DIAGNOSIS — Z125 Encounter for screening for malignant neoplasm of prostate: Secondary | ICD-10-CM | POA: Diagnosis not present

## 2019-08-08 DIAGNOSIS — I10 Essential (primary) hypertension: Secondary | ICD-10-CM | POA: Diagnosis not present

## 2019-08-23 ENCOUNTER — Other Ambulatory Visit: Payer: Self-pay

## 2019-08-23 DIAGNOSIS — C8318 Mantle cell lymphoma, lymph nodes of multiple sites: Secondary | ICD-10-CM

## 2019-08-23 NOTE — Progress Notes (Signed)
HEMATOLOGY/ONCOLOGY CLINIC NOTE  Date of Service: 08/24/2019  Patient Care Team: Hamrick, Lorin Mercy, MD as PCP - General (Family Medicine)  CHIEF COMPLAINTS/PURPOSE OF CONSULTATION:  F/u for recently diagnosed mantle cell lymphoma  HISTORY OF PRESENTING ILLNESS:   Dean Dixon is a wonderful 69 y.o. male who has been referred to [place] by Dr Daiva Eves for evaluation and management of Lymphocytosis. The pt reports that he is doing well overall.   The pt reports that he has had a high lymphocyte count for several years but it has never been as high as now. 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 This most recent lymphocytosis was picked up by him looking at his own blood in the lab. He has not felt any differently in the last 6 months to 1 year. Pt has arthritis, tendonitis and bursitis in his shoulder. He has not had any concerns of RA and no significantly swollen joints. Pt tore an achilles tendon and then flew on a plane which lead to a blood clot. He has been on a testosterone supplement before but has not used it in a while. Pt has had diverticulitis. He has been losing weight slowly by eating better but has not noticed any unexpected weight loss. Pt does not smoke, does not share needles and has not needed any blood transfusions in the past. Pt denies any respiratory illness or infection symptoms in the last few months. He used to have an elevated IgM level about 30 years ago. He does have several fatty lipomas, a few which he has had removed, but there have been no concerns associated with them. He has recently stopped taking herbal supplements until he can figure out what is going on with his blood counts. He has been taking Glucosamine, Chondroitin & MSM as well as a 25 mg CBD gummy and 10 mg of CBD SL for his joint pain for a few years. He is currently taking Glipizide and Metformin. The Metformin did give the pt diarrhea, which has since resolved.   Most recent lab results (04/12/19) of CBC  w/diff and CMP is as follows: all values are WNL except for Glucose at 191, WBC at 30.5K, Lymphs Abs at 19.6K, Mono Abs at 3.6K, Eos Abs at 0.5K, Baso Abs at 0.3K, Abs Immature Granulocytes at 0.3K. 04/12/2019 PSA at 2.2  On review of systems, pt reports joint pain and denies rashes, fevers, chills, night sweats, unexpected weight loss, issues with swallowing, abdominal pain, bowel movement issues, leg pain, respiratory symptoms, diarrhea and any other symptoms.   On PMHx the pt reports HTN, Diabetes, Arthritis, Diverticulitis. On Social Hx the pt reports that he is a non-smoker.  INTERVAL HISTORY:   Dean Dixon is a wonderful 69 y.o. male who is here for evaluation and management of Lymphocytosis. He is here for C3D1 of Bendamustine + Rituxan. The patient's last visit with [place] was on 07/27/2019. The pt reports that he is doing well overall.  The pt reports that he has received is second dose of the COVID19 vaccine. He has not felt well over the last four weeks due to fatigue and lack of appetite. He has been having loose, watery stools 4-5 times per day. Pt is able to resolve his diarrhea with Imodium. Pt has also doubled his dosage of Metformin, which he notes could be contributing to his diarrhea. He has noticed some increase in SOB, but nothing that is limiting.   Lab results today (08/24/19) of CBC  w/diff and CMP is as follows: all values are WNL except for WBC at 3.5K, RBC at 3.84, Hgb at 11.1, HCT at 34.2, PLT at 129K, Neutro Abs at 1.0K, Glucose at 263, Calcium at 8.5, Total Protein at 5.8, Albumin at 3.2, . 08/24/2019 LDH at 176  On review of systems, pt reports diarrhea, fatigue, lack of appetite, SOB and denies fevers, chills, mouth sores, abdominal pain, bloody stools, mucous in stools and any other symptoms.   MEDICAL HISTORY:  Past Medical History:  Diagnosis Date  . Diabetes mellitus without complication ([place])   . Hypertension     SURGICAL HISTORY: Past Surgical History:    Procedure Laterality Date  . APPENDECTOMY    . TONSILLECTOMY      SOCIAL HISTORY: Social History   Socioeconomic History  . Marital status: Divorced    Spouse name: Not on file  . Number of children: Not on file  . Years of education: Not on file  . Highest education level: Not on file  Occupational History  . Not on file  Tobacco Use  . Smoking status: Never Smoker  . Smokeless tobacco: Never Used  Substance and Sexual Activity  . Alcohol use: Yes    Alcohol/week: 2.0 standard drinks    Types: 2 Standard drinks or equivalent per week  . Drug use: No  . Sexual activity: Not on file  Other Topics Concern  . Not on file  Social History Narrative  . Not on file   Social Determinants of Health   Financial Resource Strain:   . Difficulty of Paying Living Expenses: Not on file  Food Insecurity:   . Worried About Charity fundraiser in the Last Year: Not on file  . Ran Out of Food in the Last Year: Not on file  Transportation Needs:   . Lack of Transportation (Medical): Not on file  . Lack of Transportation (Non-Medical): Not on file  Physical Activity:   . Days of Exercise per Week: Not on file  . Minutes of Exercise per Session: Not on file  Stress:   . Feeling of Stress : Not on file  Social Connections:   . Frequency of Communication with Friends and Family: Not on file  . Frequency of Social Gatherings with Friends and Family: Not on file  . Attends Religious Services: Not on file  . Active Member of Clubs or Organizations: Not on file  . Attends Archivist Meetings: Not on file  . Marital Status: Not on file  Intimate Partner Violence:   . Fear of Current or Ex-Partner: Not on file  . Emotionally Abused: Not on file  . Physically Abused: Not on file  . Sexually Abused: Not on file    FAMILY HISTORY: No family history on file.  ALLERGIES:  is allergic to ivp dye [iodinated diagnostic agents].  MEDICATIONS:  Current Outpatient Medications   Medication Sig Dispense Refill  . acyclovir (ZOVIRAX) 400 MG tablet Take 1 tablet (400 mg total) by mouth daily. 30 tablet 3  . allopurinol (ZYLOPRIM) 100 MG tablet Take 1 tablet (100 mg total) by mouth 2 (two) times daily. 60 tablet 0  . amLODipine (NORVASC) 10 MG tablet Take 10 mg by mouth daily.    . Ascorbic Acid (VITAMIN C) 500 MG CAPS Take 1 capsule by mouth daily.    Marland Kitchen aspirin 325 MG tablet Take 325 mg by mouth as needed.    Marland Kitchen CANNABIDIOL PO Take 30 mg by mouth daily.    Marland Kitchen  Coenzyme Q10 (COQ-10 PO) Take 1 capsule by mouth daily. Takes CoQ 10 w/cinnamon    . dexamethasone (DECADRON) 4 MG tablet Take 2 tablets (8 mg total) by mouth daily. Start the day after bendamustine chemotherapy for 2 days. Take with food. 30 tablet 1  . glipiZIDE (GLUCOTROL XL) 10 MG 24 hr tablet Take 20 mg by mouth every morning.    . Glucosamine-Chondroitin (GLUCOSAMINE CHONDR COMPLEX PO) Take 1 tablet by mouth daily.    Marland Kitchen LORazepam (ATIVAN) 0.5 MG tablet Take 1 tablet (0.5 mg total) by mouth every 6 (six) hours as needed (Nausea or vomiting). 30 tablet 0  . metFORMIN (GLUCOPHAGE-XR) 500 MG 24 hr tablet Take 500 mg by mouth daily.    . Multiple Vitamin (MULTIVITAMIN) tablet Take 1 tablet by mouth daily.    . ondansetron (ZOFRAN) 8 MG tablet Take 1 tablet (8 mg total) by mouth 2 (two) times daily as needed for refractory nausea / vomiting. Start on day 2 after bendamustine chemotherapy. 30 tablet 1  . prochlorperazine (COMPAZINE) 10 MG tablet Take 1 tablet (10 mg total) by mouth every 6 (six) hours as needed (Nausea or vomiting). 30 tablet 1  . ramipril (ALTACE) 10 MG capsule Take 10 mg by mouth daily.    Marland Kitchen triamterene-hydrochlorothiazide (MAXZIDE-25) 37.5-25 MG per tablet Take 1 tablet by mouth daily.     No current facility-administered medications for this visit.   Facility-Administered Medications Ordered in Other Visits  Medication Dose Route Frequency Provider Last Rate Last Admin  . bendamustine (BENDEKA)  200 mg in sodium chloride 0.9 % 50 mL (3.4483 mg/mL) chemo infusion  90 mg/m2 (Treatment Plan Recorded) Intravenous Once Brunetta Genera, MD      . methylPREDNISolone sodium succinate (SOLU-MEDROL) 125 mg/2 mL injection 80 mg  80 mg Intravenous Daily Brunetta Genera, MD   80 mg at 08/24/19 1108    REVIEW OF SYSTEMS:   A 10+ POINT REVIEW OF SYSTEMS WAS OBTAINED including neurology, dermatology, psychiatry, cardiac, respiratory, lymph, extremities, GI, GU, Musculoskeletal, constitutional, breasts, reproductive, HEENT.  All pertinent positives are noted in the HPI.  All others are negative.   PHYSICAL EXAMINATION: ECOG PERFORMANCE STATUS: 0 - Asymptomatic  . Vitals:   08/24/19 0943  BP: 117/65  Pulse: 89  Resp: 18  Temp: 97.8 F (36.6 C)  SpO2: 97%   Filed Weights   08/24/19 0943  Weight: 214 lb 14.4 oz (97.5 kg)   .Body mass index is 36.89 kg/m.   Exam was given in a chair   GENERAL:alert, in no acute distress and comfortable SKIN: no acute rashes, no significant lesions EYES: conjunctiva are pink and non-injected, sclera anicteric OROPHARYNX: MMM, no exudates, no oropharyngeal erythema or ulceration NECK: supple, no JVD LYMPH:  no palpable lymphadenopathy in the cervical, axillary or inguinal regions LUNGS: clear to auscultation b/l with normal respiratory effort HEART: regular rate & rhythm ABDOMEN:  normoactive bowel sounds , non tender, not distended. No palpable hepatosplenomegaly.  Extremity: no pedal edema PSYCH: alert & oriented x 3 with fluent speech NEURO: no focal motor/sensory deficits  LABORATORY DATA:  I have reviewed the data as listed  . CBC Latest Ref Rng & Units 08/24/2019 07/27/2019 07/06/2019  WBC 4.0 - 10.5 K/uL 3.5(L) 8.0 14.0(H)  Hemoglobin 13.0 - 17.0 g/dL 11.1(L) 13.2 12.6(L)  Hematocrit 39.0 - 52.0 % 34.2(L) 40.6 38.4(L)  Platelets 150 - 400 K/uL 129(L) 93(L) 146(L)    . CMP Latest Ref Rng & Units 08/24/2019 07/27/2019 07/06/2019  Glucose  70 - 99 mg/dL 263(H) 437(H) 146(H)  BUN 8 - 23 mg/dL _0 Creatinine 0.61 - 1.24 mg/dL 0.89 1.10 0.96  Sodium 135 - 145 mmol/L 141 138 143  Potassium 3.5 - 5.1 mmol/L 3.7 4.3 4.4  Chloride 98 - 111 mmol/L 107 103 106  CO2 22 - 32 mmol/L _1 Calcium 8.9 - 10.3 mg/dL 8.5(L) 8.9 8.6(L)  Total Protein 6.5 - 8.1 g/dL 5.8(L) 6.7 6.5  Total Bilirubin 0.3 - 1.2 mg/dL 0.4 0.5 0.5  Alkaline Phos 38 - 126 U/L 85 103 82  AST 15 - 41 U/L _2 ALT 0 - 44 U/L 23 23 32   06/10/2019 Flow Pathology Report ([id number]):   06/10/2019 Surgical Pathology ([id number]):   Surgical Pathology  CASE: [id number]  PATIENT: Doren Wanless  Flow Pathology Report      Clinical history: Lymphocytosis      DIAGNOSIS:   -Abnormal B-cell population identified.  -See comment   COMMENT:   Analysis of the lymphoid population shows a major abnormal B-cell  population representing 74% of all lymphocytes with expression of  HLA-Dr, CD20 and CD5. This is associated with extremely dim/negative  staining for surface immunoglobulin light chains hindering assessment  and or quantitation of clonality. No CD10, CD38, or CD34 expression is  identified. The findings are most consistent with a B-cell  lymphoproliferative process. Examination of peripheral blood shows  predominance of medium and large atypical lymphoid cells characterized  by coarse chromatin, moderately abundant agranular blue cytoplasm and  occasional cells with small nucleoli. Consideration should be given to  chronic lymphocytic leukemia variant, mantle cell lymphoma, as well as  other CD5 positive B-cell lymphoproliferative processes. FISH studies  may be helpful in this regard.   GATING AND PHENOTYPIC ANALYSIS:   Gated population: Flow cytometric immunophenotyping is performed using  antibodies to the antigens listed in the table below. Electronic gates  are placed around a cell cluster displaying light  scatter properties  corresponding to: lymphocytes   Abnormal Cells in gated population: 74%   Phenotype of Abnormal Cells: CD5, CD20, HLA-Dr    04/26/2019 Flow Cytometry [id number]):    05/02/2019 FISH:    WBC Neutros Lymphs  02/05/2017 9.9K 6.0K 2.6K  10/06/2017 12.6K 7.7K 3.2K  11/02/2018 12.3K 5.2K 5.6K    RADIOGRAPHIC STUDIES: I have personally reviewed the radiological images as listed and agreed with the findings in the report. No results found.  ASSESSMENT & PLAN:   69 yo male with   1) Progressively increasing Lymphocytosis concerning for a lymphoproliferative. CD5+ve lymphoproliferative disorder likely CLL less likely Mantle cell lymphoma CLL FISH shows 17p mutation -04/26/2019 Flow Cytometry [phone]) revealed "Abnormal B-cell population identified. CD5+ clonal lymphoproliferative disorder" -04/26/2019 FISH revealed "Positive for p53 (17p13) deletion." -05/31/2019 PET/CT ([id number]) revealed "1. There are multiple prominent lymph nodes in the neck, both axilla, the retroperitoneum and pelvis with low level hypermetabolic activity consistent with chronic lymphocytic leukemia. [place] 3 and 4. 2. Mild splenomegaly and mild generalized splenic hypermetabolic activity 3. No evidence of bone marrow involvement." -06/10/2019 Flow Pathology Report ([id number]) revealed "Abnormal B-cell population." -06/10/2019 Surgical Pathology Report ([id number])  revealed "Atypical lymphoid proliferation consistent with mantle cell lymphoma."  2) Rigors from Rituxan - needing demerol as premedication. Rituxan rate to be capped at 285m/hour PLAN: -Discussed pt labwork today, 08/24/19; mild anemia, mild neutropenia, improving thrombocytopenia, Glucose has improved, blood chemistries are steady -Discussed 08/24/2019 LDH is WNL -The pt has no prohibitive  toxicities from continuing C3D1 of Bendamustine + Rituxan at this time -Will continue Demerol as premedication with C3D1  due to rigors due to Rituxan and capping Rituxan @ 271m/hour -Due to chemotherapy related cytopenias discussed cutting down the dose of Bendamustine for following cycles, waiting a week until we give C3, or adding a G-CSF and keeping Bendamustine at the same dose -Due to 17p mutation would like to be more aggressive - will keep same dose of Bendamustine and give pt Neulasta Onpro after treatment tomorrow -Advised pt to continue taking 1 tablet of Dexamethasone for two days after treatment  -Recommend pt continue to use Imodium prn for diarrhea -Continue to take Glipizide and Metformin as prescribed  -Will get PET/CT and labs in 2 weeks -Will see back in 4 weeks   FOLLOW UP: Labs in 2 weeks PET/CT in 2 weeks Please schedule C4 of BR D1 and D2 labs and MD visit on C4D1.   The total time spent in the appt was 30 minutes and more than 50% was on counseling and direct patient cares.  All of the patient's questions were answered with apparent satisfaction. The patient knows to call the clinic with any problems, questions or concerns.   GSullivan LoneMD MPerkinsvilleAAHIVMS SAdvanced Pain ManagementCGlens Falls HospitalHematology/Oncology Physician CBryn Mawr Medical [facility] Association (Office):       [phone](Work cell):  [phone](Fax):           [phone] 08/24/2019 1:15 PM  I, JYevette Edwards am acting as a scribe for Dr. GSullivan Lone   .I have reviewed the above documentation for accuracy and completeness, and I agree with the above. .Brunetta GeneraMD

## 2019-08-24 ENCOUNTER — Other Ambulatory Visit: Payer: Self-pay

## 2019-08-24 ENCOUNTER — Inpatient Hospital Stay: Payer: Medicare Other | Attending: Hematology

## 2019-08-24 ENCOUNTER — Inpatient Hospital Stay: Payer: Medicare Other

## 2019-08-24 ENCOUNTER — Inpatient Hospital Stay (HOSPITAL_BASED_OUTPATIENT_CLINIC_OR_DEPARTMENT_OTHER): Payer: Medicare Other | Admitting: Hematology

## 2019-08-24 ENCOUNTER — Ambulatory Visit (HOSPITAL_BASED_OUTPATIENT_CLINIC_OR_DEPARTMENT_OTHER): Payer: Medicare Other | Admitting: Medical

## 2019-08-24 VITALS — BP 129/68 | HR 94 | Temp 98.7°F | Resp 18

## 2019-08-24 VITALS — BP 117/65 | HR 89 | Temp 97.8°F | Resp 18 | Ht 64.0 in | Wt 214.9 lb

## 2019-08-24 DIAGNOSIS — M129 Arthropathy, unspecified: Secondary | ICD-10-CM | POA: Insufficient documentation

## 2019-08-24 DIAGNOSIS — C8310 Mantle cell lymphoma, unspecified site: Secondary | ICD-10-CM | POA: Diagnosis not present

## 2019-08-24 DIAGNOSIS — Z79899 Other long term (current) drug therapy: Secondary | ICD-10-CM | POA: Diagnosis not present

## 2019-08-24 DIAGNOSIS — Z5111 Encounter for antineoplastic chemotherapy: Secondary | ICD-10-CM

## 2019-08-24 DIAGNOSIS — R6889 Other general symptoms and signs: Secondary | ICD-10-CM | POA: Diagnosis not present

## 2019-08-24 DIAGNOSIS — E119 Type 2 diabetes mellitus without complications: Secondary | ICD-10-CM | POA: Diagnosis not present

## 2019-08-24 DIAGNOSIS — C8318 Mantle cell lymphoma, lymph nodes of multiple sites: Secondary | ICD-10-CM

## 2019-08-24 DIAGNOSIS — Z7189 Other specified counseling: Secondary | ICD-10-CM

## 2019-08-24 DIAGNOSIS — I10 Essential (primary) hypertension: Secondary | ICD-10-CM | POA: Diagnosis not present

## 2019-08-24 DIAGNOSIS — Z7984 Long term (current) use of oral hypoglycemic drugs: Secondary | ICD-10-CM | POA: Insufficient documentation

## 2019-08-24 DIAGNOSIS — T8090XA Unspecified complication following infusion and therapeutic injection, initial encounter: Secondary | ICD-10-CM

## 2019-08-24 LAB — CMP (CANCER CENTER ONLY)
ALT: 23 U/L (ref 0–44)
AST: 18 U/L (ref 15–41)
Albumin: 3.2 g/dL — ABNORMAL LOW (ref 3.5–5.0)
Alkaline Phosphatase: 85 U/L (ref 38–126)
Anion gap: 8 (ref 5–15)
BUN: 12 mg/dL (ref 8–23)
CO2: 26 mmol/L (ref 22–32)
Calcium: 8.5 mg/dL — ABNORMAL LOW (ref 8.9–10.3)
Chloride: 107 mmol/L (ref 98–111)
Creatinine: 0.89 mg/dL (ref 0.61–1.24)
GFR, Est AFR Am: >60 mL/min (ref >60)
GFR, Estimated: >60 mL/min (ref >60)
Glucose, Bld: 263 mg/dL — ABNORMAL HIGH (ref 70–99)
Potassium: 3.7 mmol/L (ref 3.5–5.1)
Sodium: 141 mmol/L (ref 135–145)
Total Bilirubin: 0.4 mg/dL (ref 0.3–1.2)
Total Protein: 5.8 g/dL — ABNORMAL LOW (ref 6.5–8.1)

## 2019-08-24 LAB — CBC WITH DIFFERENTIAL (CANCER CENTER ONLY)
Abs Immature Granulocytes: 0.02 10*3/uL (ref 0.00–0.07)
Basophils Absolute: 0.1 10*3/uL (ref 0.0–0.1)
Basophils Relative: 1 %
Eosinophils Absolute: 0.2 10*3/uL (ref 0.0–0.5)
Eosinophils Relative: 5 %
HCT: 34.2 % — ABNORMAL LOW (ref 39.0–52.0)
Hemoglobin: 11.1 g/dL — ABNORMAL LOW (ref 13.0–17.0)
Immature Granulocytes: 1 %
Lymphocytes Relative: 54 %
Lymphs Abs: 1.9 10*3/uL (ref 0.7–4.0)
MCH: 28.9 pg (ref 26.0–34.0)
MCHC: 32.5 g/dL (ref 30.0–36.0)
MCV: 89.1 fL (ref 80.0–100.0)
Monocytes Absolute: 0.4 10*3/uL (ref 0.1–1.0)
Monocytes Relative: 10 %
Neutro Abs: 1 10*3/uL — ABNORMAL LOW (ref 1.7–7.7)
Neutrophils Relative %: 29 %
Platelet Count: 129 10*3/uL — ABNORMAL LOW (ref 150–400)
RBC: 3.84 MIL/uL — ABNORMAL LOW (ref 4.22–5.81)
RDW: 15.5 % (ref 11.5–15.5)
WBC Count: 3.5 10*3/uL — ABNORMAL LOW (ref 4.0–10.5)
nRBC: 0 % (ref 0.0–0.2)

## 2019-08-24 LAB — GLUCOSE, CAPILLARY: Glucose-Capillary: 207 mg/dL — ABNORMAL HIGH (ref 70–99)

## 2019-08-24 LAB — LACTATE DEHYDROGENASE: LDH: 176 U/L (ref 98–192)

## 2019-08-24 MED ORDER — METHYLPREDNISOLONE SODIUM SUCC 125 MG IJ SOLR
INTRAMUSCULAR | Status: AC
  Filled 2019-08-24: qty 2

## 2019-08-24 MED ORDER — SODIUM CHLORIDE 0.9 % IV SOLN
Freq: Once | INTRAVENOUS | Status: AC
  Filled 2019-08-24: qty 250

## 2019-08-24 MED ORDER — MONTELUKAST SODIUM 10 MG PO TABS
ORAL_TABLET | ORAL | Status: AC
  Filled 2019-08-24: qty 1

## 2019-08-24 MED ORDER — DIPHENHYDRAMINE HCL 50 MG/ML IJ SOLN
INTRAMUSCULAR | Status: AC
  Filled 2019-08-24: qty 1

## 2019-08-24 MED ORDER — PEGFILGRASTIM 6 MG/0.6ML ~~LOC~~ PSKT
PREFILLED_SYRINGE | SUBCUTANEOUS | Status: AC
  Filled 2019-08-24: qty 0.6

## 2019-08-24 MED ORDER — METHYLPREDNISOLONE SODIUM SUCC 125 MG IJ SOLR
80.0000 mg | Freq: Every day | INTRAMUSCULAR | Status: DC
  Administered 2019-08-24: 80 mg via INTRAVENOUS

## 2019-08-24 MED ORDER — MONTELUKAST SODIUM 10 MG PO TABS
10.0000 mg | ORAL_TABLET | Freq: Once | ORAL | Status: AC
  Administered 2019-08-24: 10 mg via ORAL

## 2019-08-24 MED ORDER — FAMOTIDINE IN NACL 20-0.9 MG/50ML-% IV SOLN
INTRAVENOUS | Status: AC
  Filled 2019-08-24: qty 50

## 2019-08-24 MED ORDER — PALONOSETRON HCL INJECTION 0.25 MG/5ML
0.2500 mg | Freq: Once | INTRAVENOUS | Status: AC
  Administered 2019-08-24: 0.25 mg via INTRAVENOUS

## 2019-08-24 MED ORDER — PALONOSETRON HCL INJECTION 0.25 MG/5ML
INTRAVENOUS | Status: AC
  Filled 2019-08-24: qty 5

## 2019-08-24 MED ORDER — MEPERIDINE HCL 25 MG/ML IJ SOLN
25.0000 mg | Freq: Once | INTRAMUSCULAR | Status: AC
  Administered 2019-08-24: 25 mg via INTRAVENOUS

## 2019-08-24 MED ORDER — MEPERIDINE HCL 50 MG/ML IJ SOLN
50.0000 mg | Freq: Once | INTRAMUSCULAR | Status: AC
  Administered 2019-08-24: 50 mg via INTRAVENOUS

## 2019-08-24 MED ORDER — SODIUM CHLORIDE 0.9 % IV SOLN
375.0000 mg/m2 | Freq: Once | INTRAVENOUS | Status: AC
  Administered 2019-08-24: 800 mg via INTRAVENOUS
  Filled 2019-08-24: qty 50

## 2019-08-24 MED ORDER — FAMOTIDINE IN NACL 20-0.9 MG/50ML-% IV SOLN
20.0000 mg | Freq: Once | INTRAVENOUS | Status: AC
  Administered 2019-08-24: 20 mg via INTRAVENOUS

## 2019-08-24 MED ORDER — ACETAMINOPHEN 500 MG PO TABS
ORAL_TABLET | ORAL | Status: AC
  Filled 2019-08-24: qty 2

## 2019-08-24 MED ORDER — ACETAMINOPHEN 500 MG PO TABS
1000.0000 mg | ORAL_TABLET | Freq: Once | ORAL | Status: AC
  Administered 2019-08-24: 1000 mg via ORAL

## 2019-08-24 MED ORDER — MEPERIDINE HCL 50 MG/ML IJ SOLN
INTRAMUSCULAR | Status: AC
  Filled 2019-08-24: qty 1

## 2019-08-24 MED ORDER — LORAZEPAM 2 MG/ML IJ SOLN
INTRAMUSCULAR | Status: AC
  Filled 2019-08-24: qty 1

## 2019-08-24 MED ORDER — LORAZEPAM 2 MG/ML IJ SOLN
0.5000 mg | Freq: Once | INTRAMUSCULAR | Status: AC
  Administered 2019-08-24: 0.5 mg via INTRAVENOUS

## 2019-08-24 MED ORDER — DIPHENHYDRAMINE HCL 50 MG/ML IJ SOLN
50.0000 mg | Freq: Once | INTRAMUSCULAR | Status: AC
  Administered 2019-08-24: 50 mg via INTRAVENOUS

## 2019-08-24 MED ORDER — SODIUM CHLORIDE 0.9 % IV SOLN
90.0000 mg/m2 | Freq: Once | INTRAVENOUS | Status: AC
  Administered 2019-08-24: 200 mg via INTRAVENOUS
  Filled 2019-08-24: qty 8

## 2019-08-24 MED ORDER — MEPERIDINE HCL 25 MG/ML IJ SOLN
INTRAMUSCULAR | Status: AC
  Filled 2019-08-24: qty 1

## 2019-08-24 NOTE — Patient Instructions (Signed)
[facility] Discharge Instructions for Patients Receiving Chemotherapy  Today you received the following chemotherapy agents Rituximab, Bendamustine  To help prevent nausea and vomiting after your treatment, we encourage you to take your nausea medication as directed.   If you develop nausea and vomiting that is not controlled by your nausea medication, call the clinic.   BELOW ARE SYMPTOMS THAT SHOULD BE REPORTED IMMEDIATELY:  *FEVER GREATER THAN 100.5 F  *CHILLS WITH OR WITHOUT FEVER  NAUSEA AND VOMITING THAT IS NOT CONTROLLED WITH YOUR NAUSEA MEDICATION  *UNUSUAL SHORTNESS OF BREATH  *UNUSUAL BRUISING OR BLEEDING  TENDERNESS IN MOUTH AND THROAT WITH OR WITHOUT PRESENCE OF ULCERS  *URINARY PROBLEMS  *BOWEL PROBLEMS  UNUSUAL RASH Items with * indicate a potential emergency and should be followed up as soon as possible.  Feel free to call the clinic should you have any questions or concerns. The clinic phone number is [phone].  Please show the [facility] at check-in to the Emergency Department and triage nurse.

## 2019-08-24 NOTE — Progress Notes (Signed)
DATE:  08/24/2019                                          X  CHEMO/IMMUNOTHERAPY REACTION            MD:  Dr. Sullivan Lone   AGENT/BLOOD [place]:               Rituximab    AGENT/BLOOD PRODUCT RECEIVING IMMEDIATELY PRIOR TO REACTION:         Rituximab at rate of 300 mL / 30 minutes x 2 minutes   VS: BP:      135/80   P:        98       SPO2:        99% on 3 LPM of O2 via nasal cannula                BP:      154/99   P:        117       SPO2:      98% on 3 LPM of O2 via nasal cannula   REACTION(S):            Desaturation to 86% on room air, rigors, nausea, and slight shortness of breath   PREMEDS:      Aloxi, Tylenol 1000 mg p.o. x1, Benadryl 50 mg IV x1, Pepcid 20 mg IV x1, Demerol 50 mg IV x1, Singulair, Solu-Medrol 80 mg IV x1   INTERVENTION: Demerol 25 mg IV x1, Pepcid 20 mg IV x1, supplemental oxygen, and Ativan 0.5 mg IV x1   Review of Systems  Review of Systems  Constitutional: Negative for chills, diaphoresis and fever.  HENT: Negative for trouble swallowing and voice change.   Respiratory: Positive for shortness of breath. Negative for cough, chest tightness and wheezing.   Cardiovascular: Negative for chest pain and palpitations.  Gastrointestinal: Positive for nausea. Negative for abdominal pain, constipation, diarrhea and vomiting.  Musculoskeletal: Negative for back pain and myalgias.  Neurological: Positive for tremors. Negative for dizziness, light-headedness and headaches.     Physical Exam  Physical Exam Constitutional:      General: He is not in acute distress.    Appearance: He is not diaphoretic.  HENT:     Head: Normocephalic and atraumatic.  Eyes:     General: No scleral icterus.       Right eye: No discharge.        Left eye: No discharge.     Conjunctiva/sclera: Conjunctivae normal.  Cardiovascular:     Rate and Rhythm: Regular rhythm. Tachycardia present.     Heart sounds: Normal heart sounds. No murmur. No friction rub. No  gallop.   Pulmonary:     Effort: Pulmonary effort is normal. No respiratory distress.     Breath sounds: Normal breath sounds. No wheezing or rales.  Skin:    General: Skin is warm and dry.     Findings: No erythema or rash.  Neurological:     Mental Status: He is alert.     Comments: Rigors were noted.  These abated after the patient had been given Demerol 25 mg IV x1, Pepcid 20 mg IV x1, and Ativan 0.5 mg IV x1.     OUTCOME:  Patient's rigors and hypoxia abated after he was given Demerol 25 mg IV x1, Pepcid 20 mg IV x1, and Ativan 0.5 mg IV x1.  It was determined by one of the infusion nurses that the Rituxan was being administered at a rate based on mL rather than milligrams.  Once this was recalculated it was determined the patient was getting a significantly higher concentration of rituximab and thought.  He had been restarted at 100 mL/h for 15 minutes.  Was then noted that the concentration was much higher and his rate infusion was decreased to 86 mL/h which was equivalent to approximately 200 mg/h.  Once this was done he had no further reactions and was able to complete his infusion today.  This patient was seen with Dr. Irene Limbo with my treatment plan reviewed with him. He expressed agreement with my medical management of this patient.    Sandi Mealy, MHS, PA-C

## 2019-08-24 NOTE — Progress Notes (Signed)
Neut# 1.0 today, okay to treat per Dr. Irene Limbo.    1330: Dean Dixon and Dr. Irene Limbo at bedside. Pt has shivers and elevated HR after Rituxan increased to 300mg /ml. Tx paused,500 NS given. Additional medications: Demerol 25mg , Pepcid IV, Ativan 0.5mg . Pt AxO x3 at all time.

## 2019-08-25 ENCOUNTER — Inpatient Hospital Stay: Payer: Medicare Other

## 2019-08-25 ENCOUNTER — Other Ambulatory Visit: Payer: Self-pay

## 2019-08-25 VITALS — BP 116/65 | HR 78 | Temp 98.2°F | Resp 20 | Wt 208.5 lb

## 2019-08-25 DIAGNOSIS — C8310 Mantle cell lymphoma, unspecified site: Secondary | ICD-10-CM | POA: Diagnosis not present

## 2019-08-25 DIAGNOSIS — Z7984 Long term (current) use of oral hypoglycemic drugs: Secondary | ICD-10-CM | POA: Diagnosis not present

## 2019-08-25 DIAGNOSIS — I10 Essential (primary) hypertension: Secondary | ICD-10-CM | POA: Diagnosis not present

## 2019-08-25 DIAGNOSIS — C8318 Mantle cell lymphoma, lymph nodes of multiple sites: Secondary | ICD-10-CM

## 2019-08-25 DIAGNOSIS — Z5111 Encounter for antineoplastic chemotherapy: Secondary | ICD-10-CM | POA: Diagnosis not present

## 2019-08-25 DIAGNOSIS — Z7189 Other specified counseling: Secondary | ICD-10-CM

## 2019-08-25 DIAGNOSIS — M129 Arthropathy, unspecified: Secondary | ICD-10-CM | POA: Diagnosis not present

## 2019-08-25 DIAGNOSIS — E119 Type 2 diabetes mellitus without complications: Secondary | ICD-10-CM | POA: Diagnosis not present

## 2019-08-25 MED ORDER — SODIUM CHLORIDE 0.9 % IV SOLN
Freq: Once | INTRAVENOUS | Status: AC
  Filled 2019-08-25: qty 250

## 2019-08-25 MED ORDER — METHYLPREDNISOLONE SODIUM SUCC 125 MG IJ SOLR
80.0000 mg | Freq: Once | INTRAMUSCULAR | Status: AC
  Administered 2019-08-25: 80 mg via INTRAVENOUS

## 2019-08-25 MED ORDER — SODIUM CHLORIDE 0.9 % IV SOLN
90.0000 mg/m2 | Freq: Once | INTRAVENOUS | Status: AC
  Administered 2019-08-25: 200 mg via INTRAVENOUS
  Filled 2019-08-25: qty 8

## 2019-08-25 MED ORDER — METHYLPREDNISOLONE SODIUM SUCC 125 MG IJ SOLR
INTRAMUSCULAR | Status: AC
  Filled 2019-08-25: qty 2

## 2019-08-25 MED ORDER — PEGFILGRASTIM 6 MG/0.6ML ~~LOC~~ PSKT
PREFILLED_SYRINGE | SUBCUTANEOUS | Status: AC
  Filled 2019-08-25: qty 0.6

## 2019-08-25 MED ORDER — PEGFILGRASTIM 6 MG/0.6ML ~~LOC~~ PSKT
6.0000 mg | PREFILLED_SYRINGE | Freq: Once | SUBCUTANEOUS | Status: AC
  Administered 2019-08-25: 12:00:00 6 mg via SUBCUTANEOUS

## 2019-08-25 NOTE — Patient Instructions (Addendum)
[facility] Discharge Instructions for Patients Receiving Chemotherapy  Today you received the following chemotherapy agent: Bendamustine Verl Dicker)  To help prevent nausea and vomiting after your treatment, we encourage you to take your nausea medication as directed by your MD.   If you develop nausea and vomiting that is not controlled by your nausea medication, call the clinic.   BELOW ARE SYMPTOMS THAT SHOULD BE REPORTED IMMEDIATELY:  *FEVER GREATER THAN 100.5 F  *CHILLS WITH OR WITHOUT FEVER  NAUSEA AND VOMITING THAT IS NOT CONTROLLED WITH YOUR NAUSEA MEDICATION  *UNUSUAL SHORTNESS OF BREATH  *UNUSUAL BRUISING OR BLEEDING  TENDERNESS IN MOUTH AND THROAT WITH OR WITHOUT PRESENCE OF ULCERS  *URINARY PROBLEMS  *BOWEL PROBLEMS  UNUSUAL RASH Items with * indicate a potential emergency and should be followed up as soon as possible.  Feel free to call the clinic should you have any questions or concerns. The clinic phone number is [phone].  Please show the [facility] at check-in to the Emergency Department and triage nurse.  Pegfilgrastim injection What is this medicine? PEGFILGRASTIM (PEG fil gra stim) is a long-acting granulocyte colony-stimulating factor that stimulates the growth of neutrophils, a type of white blood cell important in the body's fight against infection. It is used to reduce the incidence of fever and infection in patients with certain types of cancer who are receiving chemotherapy that affects the bone marrow, and to increase survival after being exposed to high doses of radiation. This medicine may be used for other purposes; ask your health care provider or pharmacist if you have questions. COMMON BRAND NAME(S): Steve Rattler, Ziextenzo What should I tell my health care provider before I take this medicine? They need to know if you have any of these conditions:  kidney disease  latex allergy  ongoing radiation  therapy  sickle cell disease  skin reactions to acrylic adhesives (On-Body Injector only)  an unusual or allergic reaction to pegfilgrastim, filgrastim, other medicines, foods, dyes, or preservatives  pregnant or trying to get pregnant  breast-feeding How should I use this medicine? This medicine is for injection under the skin. If you get this medicine at home, you will be taught how to prepare and give the pre-filled syringe or how to use the On-body Injector. Refer to the patient Instructions for Use for detailed instructions. Use exactly as directed. Tell your healthcare provider immediately if you suspect that the On-body Injector may not have performed as intended or if you suspect the use of the On-body Injector resulted in a missed or partial dose. It is important that you put your used needles and syringes in a special sharps container. Do not put them in a trash can. If you do not have a sharps container, call your pharmacist or healthcare provider to get one. Talk to your pediatrician regarding the use of this medicine in children. While this drug may be prescribed for selected conditions, precautions do apply. Overdosage: If you think you have taken too much of this medicine contact a poison control center or emergency room at once. NOTE: This medicine is only for you. Do not share this medicine with others. What if I miss a dose? It is important not to miss your dose. Call your doctor or health care professional if you miss your dose. If you miss a dose due to an On-body Injector failure or leakage, a new dose should be administered as soon as possible using a single prefilled syringe for manual  use. What may interact with this medicine? Interactions have not been studied. Give your health care provider a list of all the medicines, herbs, non-prescription drugs, or dietary supplements you use. Also tell them if you smoke, drink alcohol, or use illegal drugs. Some items may interact  with your medicine. This list may not describe all possible interactions. Give your health care provider a list of all the medicines, herbs, non-prescription drugs, or dietary supplements you use. Also tell them if you smoke, drink alcohol, or use illegal drugs. Some items may interact with your medicine. What should I watch for while using this medicine? You may need blood work done while you are taking this medicine. If you are going to need a MRI, CT scan, or other procedure, tell your doctor that you are using this medicine (On-Body Injector only). What side effects may I notice from receiving this medicine? Side effects that you should report to your doctor or health care professional as soon as possible:  allergic reactions like skin rash, itching or hives, swelling of the face, lips, or tongue  back pain  dizziness  fever  pain, redness, or irritation at site where injected  pinpoint red spots on the skin  red or dark-brown urine  shortness of breath or breathing problems  stomach or side pain, or pain at the shoulder  swelling  tiredness  trouble passing urine or change in the amount of urine Side effects that usually do not require medical attention (report to your doctor or health care professional if they continue or are bothersome):  bone pain  muscle pain This list may not describe all possible side effects. Call your doctor for medical advice about side effects. You may report side effects to FDA at 1-800-FDA-1088. Where should I keep my medicine? Keep out of the reach of children. If you are using this medicine at home, you will be instructed on how to store it. Throw away any unused medicine after the expiration date on the label. NOTE: This sheet is a summary. It may not cover all possible information. If you have questions about this medicine, talk to your doctor, pharmacist, or health care provider.  2020 Elsevier/Gold Standard (2017-09-14  16:57:08)   Coronavirus (COVID-19) Are you at risk?  Are you at risk for the Coronavirus (COVID-19)?  To be considered HIGH RISK for Coronavirus (COVID-19), you have to meet the following criteria:  . Traveled to [place], [place], [place], [place] or [place]; or in the [place] to [place], [place], [place], or [place]; and have fever, cough, and shortness of breath within the last 2 weeks of travel OR . Been in close contact with a person diagnosed with COVID-19 within the last 2 weeks and have fever, cough, and shortness of breath . IF YOU DO NOT MEET THESE CRITERIA, YOU ARE CONSIDERED LOW RISK FOR COVID-19.  What to do if you are HIGH RISK for COVID-19?  Marland Kitchen If you are having a medical emergency, call 911. . Seek medical care right away. Before you go to a doctor's office, urgent care or emergency department, call ahead and tell them about your recent travel, contact with someone diagnosed with COVID-19, and your symptoms. You should receive instructions from your physician's office regarding next steps of care.  . When you arrive at healthcare provider, tell the healthcare staff immediately you have returned from visiting [place], [place], [place], [place] or [place]; or traveled in the [place] to [place], [place], [place], or [place]; in the last two weeks or you have been in close contact with a person diagnosed with COVID-19 in the last 2 weeks.   . Tell the health care staff about your symptoms: fever, cough and shortness of breath. . After you have been seen by a medical provider, you will be either: o Tested for (COVID-19) and discharged home on quarantine except to seek medical care if symptoms worsen, and asked to  - Stay home and avoid contact with others until you get your results (4-5 days)  - Avoid travel on public transportation if possible (such as bus, train, or airplane) or o Sent to the Emergency Department by EMS for evaluation, COVID-19 testing, and  possible admission depending on your condition and test results.  What to do if you are LOW RISK for COVID-19?  Reduce your risk of any infection by using the same precautions used for avoiding the common cold or flu:  Marland Kitchen Wash your hands often with soap and warm water for at least 20 seconds.  If soap and water are not readily available, use an alcohol-based hand sanitizer with at least 60% alcohol.  . If coughing or sneezing, cover your mouth and nose by coughing or sneezing into the elbow areas of your shirt or coat, into a tissue or into your sleeve (not your hands). . Avoid shaking hands with others and consider head nods or verbal greetings only. . Avoid touching your eyes, nose, or mouth with unwashed hands.  . Avoid close contact with people who are sick. . Avoid places or events with large numbers of people in one location, like concerts or sporting events. . Carefully consider travel plans you have or are making. . If you are planning any travel outside or inside the [place], visit the CDC's Travelers' Health webpage for the latest health notices. . If you have some symptoms but not all symptoms, continue to monitor at home and seek medical attention if your symptoms worsen. . If you are having a medical emergency, call 911.   [facility] / e-Visit: [email or web address]         [facility]: [facility]: [id number]                   [facility]: [phone]

## 2019-08-25 NOTE — Progress Notes (Signed)
Neulasta ON PRO video viewed.

## 2019-09-15 ENCOUNTER — Telehealth: Payer: Self-pay

## 2019-09-15 ENCOUNTER — Encounter: Payer: Self-pay | Admitting: Hematology

## 2019-09-15 NOTE — Telephone Encounter (Signed)
TCT patient regarding his scheduled PET scan on 09/19/19 at 1000 with no response. LVM with details of appt.

## 2019-09-15 NOTE — Progress Notes (Signed)
Pharmacist Chemotherapy Monitoring - Follow Up Assessment    I verify that I have reviewed each item in the below checklist:  . Regimen for the patient is scheduled for the appropriate day and plan matches scheduled date. Marland Kitchen Appropriate non-routine labs are ordered dependent on drug ordered. . If applicable, additional medications reviewed and ordered per protocol based on lifetime cumulative doses and/or treatment regimen.   Plan for follow-up and/or issues identified: No . I-vent associated with next due treatment: No . MD and/or nursing notified: No  Jori Thrall D 09/15/2019 12:00 PM

## 2019-09-19 ENCOUNTER — Ambulatory Visit (HOSPITAL_COMMUNITY)
Admission: RE | Admit: 2019-09-19 | Discharge: 2019-09-19 | Disposition: A | Discharge status: Home / Self Care | Payer: Medicare Other | Source: Ambulatory Visit | Attending: Hematology | Admitting: Hematology

## 2019-09-19 ENCOUNTER — Other Ambulatory Visit: Payer: Self-pay

## 2019-09-19 DIAGNOSIS — C8318 Mantle cell lymphoma, lymph nodes of multiple sites: Secondary | ICD-10-CM | POA: Diagnosis not present

## 2019-09-19 DIAGNOSIS — Z79899 Other long term (current) drug therapy: Secondary | ICD-10-CM | POA: Diagnosis not present

## 2019-09-19 DIAGNOSIS — C8310 Mantle cell lymphoma, unspecified site: Secondary | ICD-10-CM | POA: Diagnosis not present

## 2019-09-19 DIAGNOSIS — R161 Splenomegaly, not elsewhere classified: Secondary | ICD-10-CM | POA: Diagnosis not present

## 2019-09-19 LAB — GLUCOSE, CAPILLARY: Glucose-Capillary: 205 mg/dL — ABNORMAL HIGH (ref 70–99)

## 2019-09-19 MED ORDER — FLUDEOXYGLUCOSE F - 18 (FDG) INJECTION
10.3400 | Freq: Once | INTRAVENOUS | Status: AC | PRN
  Administered 2019-09-19: 10.34 via INTRAVENOUS

## 2019-09-20 NOTE — Progress Notes (Signed)
HEMATOLOGY/ONCOLOGY CLINIC NOTE  Date of Service: 09/21/2019  Patient Care Team: Hamrick, Lorin Mercy, MD as PCP - General (Family Medicine)  CHIEF COMPLAINTS/PURPOSE OF CONSULTATION:  F/u for recently diagnosed mantle cell lymphoma  HISTORY OF PRESENTING ILLNESS:   Dean Dixon is a wonderful 69 y.o. male who has been referred to [place] by Dr Daiva Eves for evaluation and management of Lymphocytosis. The pt reports that he is doing well overall.   The pt reports that he has had a high lymphocyte count for several years but it has never been as high as now. 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 This most recent lymphocytosis was picked up by him looking at his own blood in the lab. He has not felt any differently in the last 6 months to 1 year. Pt has arthritis, tendonitis and bursitis in his shoulder. He has not had any concerns of RA and no significantly swollen joints. Pt tore an achilles tendon and then flew on a plane which lead to a blood clot. He has been on a testosterone supplement before but has not used it in a while. Pt has had diverticulitis. He has been losing weight slowly by eating better but has not noticed any unexpected weight loss. Pt does not smoke, does not share needles and has not needed any blood transfusions in the past. Pt denies any respiratory illness or infection symptoms in the last few months. He used to have an elevated IgM level about 30 years ago. He does have several fatty lipomas, a few which he has had removed, but there have been no concerns associated with them. He has recently stopped taking herbal supplements until he can figure out what is going on with his blood counts. He has been taking Glucosamine, Chondroitin & MSM as well as a 25 mg CBD gummy and 10 mg of CBD SL for his joint pain for a few years. He is currently taking Glipizide and Metformin. The Metformin did give the pt diarrhea, which has since resolved.   Most recent lab results (04/12/19) of  CBC w/diff and CMP is as follows: all values are WNL except for Glucose at 191, WBC at 30.5K, Lymphs Abs at 19.6K, Mono Abs at 3.6K, Eos Abs at 0.5K, Baso Abs at 0.3K, Abs Immature Granulocytes at 0.3K. 04/12/2019 PSA at 2.2  On review of systems, pt reports joint pain and denies rashes, fevers, chills, night sweats, unexpected weight loss, issues with swallowing, abdominal pain, bowel movement issues, leg pain, respiratory symptoms, diarrhea and any other symptoms.   On PMHx the pt reports HTN, Diabetes, Arthritis, Diverticulitis. On Social Hx the pt reports that he is a non-smoker.  INTERVAL HISTORY:   Dean Dixon is a wonderful 69 y.o. male who is here for evaluation and management of Lymphocytosis. He is here for C4D1 of Bendamustine + Rituxan. The patient's last visit with [place] was on 08/24/19. The pt reports that he is doing well overall.  The pt reports he is good. He has been having a bad gag reflex while brushing teeth. Pt has gotten both doses of COVID19 vaccine. He got a scratch from his cat and reported he bled quite a bit. Pt has not been eating as much as he use to.   Of note since the patient's last visit, pt has had PET Skull Base to Thing ([id number]) completed on 09/19/19 with results revealing 1. Interval slight decrease in size and decreased FDG uptake in the scattered nodes  in the neck, axilla, retroperitoneum and pelvis. Deauville 3 and 4. No new or progressive findings. 2. Stable splenomegaly and mild diffuse hypermetabolism. 3. No osseous involvement..  Lab results today (09/21/19) of CBC w/diff and CMP is as follows: all values are WNL except for RBC at 3.29, Hemoglobin at 9.9, HCT at 28.8, RDW at 17.0, Platelets at 99, Lymph's Abs at 0.6K, CO2 at 19, Glucose at 265, Creatinine at 1.25, GFR, Est Non Af Am at 58.  On review of systems, pt reports walking well, healthy appetite, weight loss and denies infections, irregular bowl movements, bleeding issues, new lumps/bumps,  abdominal pain and any other symptoms.    MEDICAL HISTORY:  Past Medical History:  Diagnosis Date  . Diabetes mellitus without complication ([place])   . Hypertension     SURGICAL HISTORY: Past Surgical History:  Procedure Laterality Date  . APPENDECTOMY    . TONSILLECTOMY      SOCIAL HISTORY: Social History   Socioeconomic History  . Marital status: Divorced    Spouse name: Not on file  . Number of children: Not on file  . Years of education: Not on file  . Highest education level: Not on file  Occupational History  . Not on file  Tobacco Use  . Smoking status: Never Smoker  . Smokeless tobacco: Never Used  Substance and Sexual Activity  . Alcohol use: Yes    Alcohol/week: 2.0 standard drinks    Types: 2 Standard drinks or equivalent per week  . Drug use: No  . Sexual activity: Not on file  Other Topics Concern  . Not on file  Social History Narrative  . Not on file   Social Determinants of Health   Financial Resource Strain:   . Difficulty of Paying Living Expenses:   Food Insecurity:   . Worried About Charity fundraiser in the Last Year:   . Arboriculturist in the Last Year:   Transportation Needs:   . Film/video editor (Medical):   Marland Kitchen Lack of Transportation (Non-Medical):   Physical Activity:   . Days of Exercise per Week:   . Minutes of Exercise per Session:   Stress:   . Feeling of Stress :   Social Connections:   . Frequency of Communication with Friends and Family:   . Frequency of Social Gatherings with Friends and Family:   . Attends Religious Services:   . Active Member of Clubs or Organizations:   . Attends Archivist Meetings:   Marland Kitchen Marital Status:   Intimate Partner Violence:   . Fear of Current or Ex-Partner:   . Emotionally Abused:   Marland Kitchen Physically Abused:   . Sexually Abused:     FAMILY HISTORY: No family history on file.  ALLERGIES:  is allergic to ivp dye [iodinated diagnostic agents].  MEDICATIONS:  Current  Outpatient Medications  Medication Sig Dispense Refill  . acyclovir (ZOVIRAX) 400 MG tablet Take 1 tablet (400 mg total) by mouth daily. 30 tablet 3  . allopurinol (ZYLOPRIM) 100 MG tablet Take 1 tablet (100 mg total) by mouth 2 (two) times daily. 60 tablet 0  . amLODipine (NORVASC) 10 MG tablet Take 10 mg by mouth daily.    . Ascorbic Acid (VITAMIN C) 500 MG CAPS Take 1 capsule by mouth daily.    Marland Kitchen aspirin 325 MG tablet Take 325 mg by mouth as needed.    Marland Kitchen CANNABIDIOL PO Take 30 mg by mouth daily.    . Coenzyme Q10 (  COQ-10 PO) Take 1 capsule by mouth daily. Takes CoQ 10 w/cinnamon    . dexamethasone (DECADRON) 4 MG tablet Take 2 tablets (8 mg total) by mouth daily. Start the day after bendamustine chemotherapy for 2 days. Take with food. 30 tablet 1  . glipiZIDE (GLUCOTROL XL) 10 MG 24 hr tablet Take 20 mg by mouth every morning.    . Glucosamine-Chondroitin (GLUCOSAMINE CHONDR COMPLEX PO) Take 1 tablet by mouth daily.    Marland Kitchen LORazepam (ATIVAN) 0.5 MG tablet Take 1 tablet (0.5 mg total) by mouth every 6 (six) hours as needed (Nausea or vomiting). 30 tablet 0  . metFORMIN (GLUCOPHAGE-XR) 500 MG 24 hr tablet Take 500 mg by mouth daily.    . Multiple Vitamin (MULTIVITAMIN) tablet Take 1 tablet by mouth daily.    . ondansetron (ZOFRAN) 8 MG tablet Take 1 tablet (8 mg total) by mouth 2 (two) times daily as needed for refractory nausea / vomiting. Start on day 2 after bendamustine chemotherapy. 30 tablet 1  . prochlorperazine (COMPAZINE) 10 MG tablet Take 1 tablet (10 mg total) by mouth every 6 (six) hours as needed (Nausea or vomiting). 30 tablet 1  . ramipril (ALTACE) 10 MG capsule Take 10 mg by mouth daily.    Marland Kitchen triamterene-hydrochlorothiazide (MAXZIDE-25) 37.5-25 MG per tablet Take 1 tablet by mouth daily.     No current facility-administered medications for this visit.    REVIEW OF SYSTEMS:   A 10+ POINT REVIEW OF SYSTEMS WAS OBTAINED including neurology, dermatology, psychiatry, cardiac,  respiratory, lymph, extremities, GI, GU, Musculoskeletal, constitutional, breasts, reproductive, HEENT.  All pertinent positives are noted in the HPI.  All others are negative.   PHYSICAL EXAMINATION: ECOG PERFORMANCE STATUS: 0 - Asymptomatic  . Vitals:   09/21/19 1014  BP: 135/65  Pulse: 91  Resp: 17  Temp: 98 F (36.7 C)  SpO2: 99%   Filed Weights   09/21/19 1014  Weight: 205 lb 12.8 oz (93.4 kg)   .Body mass index is 35.33 kg/m.   GENERAL:alert, in no acute distress and comfortable SKIN: no acute rashes, no significant lesions EYES: conjunctiva are pink and non-injected, sclera anicteric OROPHARYNX: MMM, no exudates, no oropharyngeal erythema or ulceration NECK: supple, no JVD LYMPH:  no palpable lymphadenopathy in the cervical, axillary or inguinal regions LUNGS: clear to auscultation b/l with normal respiratory effort HEART: regular rate & rhythm ABDOMEN:  normoactive bowel sounds , non tender, not distended. Extremity: no pedal edema PSYCH: alert & oriented x 3 with fluent speech NEURO: no focal motor/sensory deficits  LABORATORY DATA:  I have reviewed the data as listed  . CBC Latest Ref Rng & Units 09/21/2019 08/24/2019 07/27/2019  WBC 4.0 - 10.5 K/uL 4.2 3.5(L) 8.0  Hemoglobin 13.0 - 17.0 g/dL 9.9(L) 11.1(L) 13.2  Hematocrit 39.0 - 52.0 % 28.8(L) 34.2(L) 40.6  Platelets 150 - 400 K/uL 99(L) 129(L) 93(L)    . CMP Latest Ref Rng & Units 09/21/2019 08/24/2019 07/27/2019  Glucose 70 - 99 mg/dL 265(H) 263(H) 437(H)  BUN 8 - 23 mg/dL _0 Creatinine 0.61 - 1.24 mg/dL 1.25(H) 0.89 1.10  Sodium 135 - 145 mmol/L 141 141 138  Potassium 3.5 - 5.1 mmol/L 4.1 3.7 4.3  Chloride 98 - 111 mmol/L 108 107 103  CO2 22 - 32 mmol/L 19(L) 26 26  Calcium 8.9 - 10.3 mg/dL 9.1 8.5(L) 8.9  Total Protein 6.5 - 8.1 g/dL 6.5 5.8(L) 6.7  Total Bilirubin 0.3 - 1.2 mg/dL 0.5 0.4 0.5  Alkaline Phos 38 - 126 U/L 82 85 103  AST 15 - 41 U/L _0 ALT 0 - 44 U/L _1 06/10/2019  Flow Pathology Report ([id number]):   06/10/2019 Surgical Pathology ([id number]):   Surgical Pathology  CASE: [id number]  PATIENT: Dean Dixon  Flow Pathology Report      Clinical history: Lymphocytosis      DIAGNOSIS:   -Abnormal B-cell population identified.  -See comment   COMMENT:   Analysis of the lymphoid population shows a major abnormal B-cell  population representing 74% of all lymphocytes with expression of  HLA-Dr, CD20 and CD5. This is associated with extremely dim/negative  staining for surface immunoglobulin light chains hindering assessment  and or quantitation of clonality. No CD10, CD38, or CD34 expression is  identified. The findings are most consistent with a B-cell  lymphoproliferative process. Examination of peripheral blood shows  predominance of medium and large atypical lymphoid cells characterized  by coarse chromatin, moderately abundant agranular blue cytoplasm and  occasional cells with small nucleoli. Consideration should be given to  chronic lymphocytic leukemia variant, mantle cell lymphoma, as well as  other CD5 positive B-cell lymphoproliferative processes. FISH studies  may be helpful in this regard.   GATING AND PHENOTYPIC ANALYSIS:   Gated population: Flow cytometric immunophenotyping is performed using  antibodies to the antigens listed in the table below. Electronic gates  are placed around a cell cluster displaying light scatter properties  corresponding to: lymphocytes   Abnormal Cells in gated population: 74%   Phenotype of Abnormal Cells: CD5, CD20, HLA-Dr    04/26/2019 Flow Cytometry [phone]):    05/02/2019 FISH:    WBC Neutros Lymphs  02/05/2017 9.9K 6.0K 2.6K  10/06/2017 12.6K 7.7K 3.2K  11/02/2018 12.3K 5.2K 5.6K    RADIOGRAPHIC STUDIES: I have personally reviewed the radiological images as listed and agreed with the findings in the report. NM PET Image Restag (PS) Skull Base To  Thigh  Result Date: 09/19/2019 CLINICAL DATA:  Subsequent treatment strategy for mantle cell lymphoma. EXAM: NUCLEAR MEDICINE PET SKULL BASE TO THIGH TECHNIQUE: 10.34 mCi F-18 FDG was injected intravenously. Full-ring PET imaging was performed from the skull base to thigh after the radiotracer. CT data was obtained and used for attenuation correction and anatomic localization. Fasting blood glucose: 205 mg/dl COMPARISON:  PET-CT 05/31/2019 FINDINGS: Mediastinal blood pool activity: SUV max 3.46 Liver activity: SUV max 4.09 NECK: Small bilateral level 2 and level 3 lymph nodes are noted bilaterally. Interval decrease in FDG uptake when compared to the prior study. Right-sided node on image 37/4 has an SUV max of 4.42 and was previously 5.35. Left-sided node on image 33/4 has an SUV max of 3.25. This was previously 5.83. No new or progressive findings. Incidental CT findings: none CHEST: Small scattered bilateral axillary lymph nodes are again demonstrated. Index right-sided axillary lymph node on image 63/4 measures 7.5 mm and previously measured 8.5 mm. SUV max is 2.98 and was previously 3.18. 13 mm left axillary node on image number 63/4 previously measured 17 mm. SUV max is 3.67 and was previously 4.23. No new or progressive findings. No enlarged or hypermetabolic mediastinal or hilar lymph nodes. Incidental CT findings: No significant pulmonary findings. ABDOMEN/PELVIS: Stable mild splenomegaly. Mild diffuse hypermetabolism is also again demonstrated with SUV max of 6.0. This was previously 5.4. No hepatic lesions are identified. Small scattered retroperitoneal lymph nodes are slightly smaller. 9 mm node on image 124/4 previously measured 11.5 mm. SUV max  is 4.31 and was previously 4.63 other smaller adjacent lymph nodes are slightly smaller and demonstrates slightly lower SUV max. 12 mm right external iliac lymph node on image 170/4 has an SUV max of 4.17 was previously 4.88. 10 mm left external iliac lymph  node on image 169/4 previously measured 14.5 mm. SUV max today is 3.85 and was previously 5.89. No new or progressive findings. Incidental CT findings: none SKELETON: No focal hypermetabolic activity to suggest skeletal metastasis. Incidental CT findings: none IMPRESSION: 1. Interval slight decrease in size and decreased FDG uptake in the scattered nodes in the neck, axilla, retroperitoneum and pelvis. Deauville 3 and 4. No new or progressive findings. 2. Stable splenomegaly and mild diffuse hypermetabolism. 3. No osseous involvement. Electronically Signed   By: Marijo Sanes M.D.   On: 09/19/2019 12:48    ASSESSMENT & PLAN:   69 yo male with   1) Progressively increasing Lymphocytosis concerning for a lymphoproliferative. CD5+ve lymphoproliferative disorder likely CLL less likely Mantle cell lymphoma CLL FISH shows 17p mutation -04/26/2019 Flow Cytometry [phone]) revealed "Abnormal B-cell population identified. CD5+ clonal lymphoproliferative disorder" -04/26/2019 FISH revealed "Positive for p53 (17p13) deletion." -05/31/2019 PET/CT ([id number]) revealed "1. There are multiple prominent lymph nodes in the neck, both axilla, the retroperitoneum and pelvis with low level hypermetabolic activity consistent with chronic lymphocytic leukemia. Deauville 3 and 4. 2. Mild splenomegaly and mild generalized splenic hypermetabolic activity 3. No evidence of bone marrow involvement." -06/10/2019 Flow Pathology Report ([id number]) revealed "Abnormal B-cell population." -06/10/2019 Surgical Pathology Report ([id number])  revealed "Atypical lymphoid proliferation consistent with mantle cell lymphoma."  2) Rigors from Rituxan - needing demerol as premedication. Rituxan rate to be capped at 141m/hour  PLAN: -Discussed pt labwork today, 09/21/19; of CBC w/diff and CMP is as follows: all values are WNL except for RBC at 3.29, Hemoglobin at 9.9, HCT at 28.8, RDW at 17.0, Platelets at 99, Lymph's Abs at  0.6K, CO2 at 19, Glucose at 265, Creatinine at 1.25, GFR, Est Non Af Am at 58. -anemia partly due to blood loss from cat scratch wound. -Discussed 09/19/19 of PET Skull Base to Thigh ([id number]-- favorable response to current treatment -The pt has no prohibitive toxicities from continuing C4D1 of Bendamustine + Rituxan at this time -Will continue Demerol as premedication with C4D1 due to rigors due to Rituxan and capping Rituxan @ 1588mhour -Advised pt to continue taking 1 tablet of Dexamethasone for two days after treatment  -Recommends staying hydrated  -Continue to take Glipizide and Metformin as prescribed  -F/u with PCP for continued optimization of Dm2 control. -Will see back in 1 month  FOLLOW UP: Please schedule C5 of BR D1 and D2. Plz schedule patient for earliest possible appointment for Rituxan day since he has to get get Rituxan slower labs and MD visit on C5D1.   The total time spent in the appt was 30 minutes and more than 50% was on counseling and direct patient cares.  All of the patient's questions were answered with apparent satisfaction. The patient knows to call the clinic with any problems, questions or concerns.    GaSullivan LoneD MSRome CityAHIVMS SCFranklin Medical CenterTCharleston Ent Associates LLC Dba Surgery Center Of Charlestonematology/Oncology Physician CoUrology Surgical Center LLC(Office):       [phone] cell):  [phone]):           [phone]/31/2021 11:06 AM  I, ElDawayne Cirrim acting as a scEducation administratoror Dr. GaSullivan Lone  .I have reviewed the above documentation for accuracy and completeness,  and I agree with the above. Brunetta Genera MD

## 2019-09-21 ENCOUNTER — Inpatient Hospital Stay: Payer: Medicare Other

## 2019-09-21 ENCOUNTER — Other Ambulatory Visit: Payer: Self-pay

## 2019-09-21 ENCOUNTER — Inpatient Hospital Stay (HOSPITAL_BASED_OUTPATIENT_CLINIC_OR_DEPARTMENT_OTHER): Payer: Medicare Other | Admitting: Hematology

## 2019-09-21 ENCOUNTER — Inpatient Hospital Stay (HOSPITAL_BASED_OUTPATIENT_CLINIC_OR_DEPARTMENT_OTHER): Payer: Medicare Other | Admitting: Medical

## 2019-09-21 VITALS — BP 135/65 | HR 91 | Temp 98.0°F | Resp 17 | Ht 64.0 in | Wt 205.8 lb

## 2019-09-21 VITALS — BP 113/53 | HR 87 | Temp 98.2°F | Resp 16

## 2019-09-21 DIAGNOSIS — C8310 Mantle cell lymphoma, unspecified site: Secondary | ICD-10-CM | POA: Diagnosis not present

## 2019-09-21 DIAGNOSIS — I10 Essential (primary) hypertension: Secondary | ICD-10-CM | POA: Diagnosis not present

## 2019-09-21 DIAGNOSIS — E119 Type 2 diabetes mellitus without complications: Secondary | ICD-10-CM | POA: Diagnosis not present

## 2019-09-21 DIAGNOSIS — C8318 Mantle cell lymphoma, lymph nodes of multiple sites: Secondary | ICD-10-CM

## 2019-09-21 DIAGNOSIS — M129 Arthropathy, unspecified: Secondary | ICD-10-CM | POA: Diagnosis not present

## 2019-09-21 DIAGNOSIS — Z5111 Encounter for antineoplastic chemotherapy: Secondary | ICD-10-CM

## 2019-09-21 DIAGNOSIS — Z7189 Other specified counseling: Secondary | ICD-10-CM

## 2019-09-21 DIAGNOSIS — R6889 Other general symptoms and signs: Secondary | ICD-10-CM

## 2019-09-21 DIAGNOSIS — T8090XA Unspecified complication following infusion and therapeutic injection, initial encounter: Secondary | ICD-10-CM

## 2019-09-21 DIAGNOSIS — Z7984 Long term (current) use of oral hypoglycemic drugs: Secondary | ICD-10-CM | POA: Diagnosis not present

## 2019-09-21 LAB — CBC WITH DIFFERENTIAL/PLATELET
Abs Immature Granulocytes: 0.02 10*3/uL (ref 0.00–0.07)
Basophils Absolute: 0 10*3/uL (ref 0.0–0.1)
Basophils Relative: 1 %
Eosinophils Absolute: 0.4 10*3/uL (ref 0.0–0.5)
Eosinophils Relative: 9 %
HCT: 28.8 % — ABNORMAL LOW (ref 39.0–52.0)
Hemoglobin: 9.9 g/dL — ABNORMAL LOW (ref 13.0–17.0)
Immature Granulocytes: 1 %
Lymphocytes Relative: 13 %
Lymphs Abs: 0.6 10*3/uL — ABNORMAL LOW (ref 0.7–4.0)
MCH: 30.1 pg (ref 26.0–34.0)
MCHC: 34.4 g/dL (ref 30.0–36.0)
MCV: 87.5 fL (ref 80.0–100.0)
Monocytes Absolute: 0.2 10*3/uL (ref 0.1–1.0)
Monocytes Relative: 5 %
Neutro Abs: 3 10*3/uL (ref 1.7–7.7)
Neutrophils Relative %: 71 %
Platelets: 99 10*3/uL — ABNORMAL LOW (ref 150–400)
RBC: 3.29 MIL/uL — ABNORMAL LOW (ref 4.22–5.81)
RDW: 17 % — ABNORMAL HIGH (ref 11.5–15.5)
WBC: 4.2 10*3/uL (ref 4.0–10.5)
nRBC: 0 % (ref 0.0–0.2)

## 2019-09-21 LAB — CMP (CANCER CENTER ONLY)
ALT: 26 U/L (ref 0–44)
AST: 23 U/L (ref 15–41)
Albumin: 3.8 g/dL (ref 3.5–5.0)
Alkaline Phosphatase: 82 U/L (ref 38–126)
Anion gap: 14 (ref 5–15)
BUN: 21 mg/dL (ref 8–23)
CO2: 19 mmol/L — ABNORMAL LOW (ref 22–32)
Calcium: 9.1 mg/dL (ref 8.9–10.3)
Chloride: 108 mmol/L (ref 98–111)
Creatinine: 1.25 mg/dL — ABNORMAL HIGH (ref 0.61–1.24)
GFR, Est AFR Am: >60 mL/min (ref >60)
GFR, Estimated: 58 mL/min — ABNORMAL LOW (ref >60)
Glucose, Bld: 265 mg/dL — ABNORMAL HIGH (ref 70–99)
Potassium: 4.1 mmol/L (ref 3.5–5.1)
Sodium: 141 mmol/L (ref 135–145)
Total Bilirubin: 0.5 mg/dL (ref 0.3–1.2)
Total Protein: 6.5 g/dL (ref 6.5–8.1)

## 2019-09-21 MED ORDER — MEPERIDINE HCL 25 MG/ML IJ SOLN
INTRAMUSCULAR | Status: AC
  Filled 2019-09-21: qty 1

## 2019-09-21 MED ORDER — MEPERIDINE HCL 50 MG/ML IJ SOLN
INTRAMUSCULAR | Status: AC
  Filled 2019-09-21: qty 1

## 2019-09-21 MED ORDER — SODIUM CHLORIDE 0.9 % IV SOLN
Freq: Once | INTRAVENOUS | Status: AC
  Filled 2019-09-21: qty 250

## 2019-09-21 MED ORDER — MONTELUKAST SODIUM 10 MG PO TABS
10.0000 mg | ORAL_TABLET | Freq: Once | ORAL | Status: AC
  Administered 2019-09-21: 10 mg via ORAL

## 2019-09-21 MED ORDER — SODIUM CHLORIDE 0.9 % IV SOLN
375.0000 mg/m2 | Freq: Once | INTRAVENOUS | Status: AC
  Administered 2019-09-21: 800 mg via INTRAVENOUS
  Filled 2019-09-21: qty 50

## 2019-09-21 MED ORDER — ACETAMINOPHEN 500 MG PO TABS
1000.0000 mg | ORAL_TABLET | Freq: Once | ORAL | Status: AC
  Administered 2019-09-21: 1000 mg via ORAL

## 2019-09-21 MED ORDER — FAMOTIDINE IN NACL 20-0.9 MG/50ML-% IV SOLN
INTRAVENOUS | Status: AC
  Filled 2019-09-21: qty 50

## 2019-09-21 MED ORDER — FAMOTIDINE IN NACL 20-0.9 MG/50ML-% IV SOLN
20.0000 mg | Freq: Once | INTRAVENOUS | Status: AC
  Administered 2019-09-21: 14:00:00 20 mg via INTRAVENOUS

## 2019-09-21 MED ORDER — METHYLPREDNISOLONE SODIUM SUCC 125 MG IJ SOLR
80.0000 mg | Freq: Every day | INTRAMUSCULAR | Status: DC
  Administered 2019-09-21: 80 mg via INTRAVENOUS

## 2019-09-21 MED ORDER — MEPERIDINE HCL 50 MG/ML IJ SOLN
50.0000 mg | Freq: Once | INTRAMUSCULAR | Status: AC
  Administered 2019-09-21: 50 mg via INTRAVENOUS

## 2019-09-21 MED ORDER — ACETAMINOPHEN 500 MG PO TABS
ORAL_TABLET | ORAL | Status: AC
  Filled 2019-09-21: qty 2

## 2019-09-21 MED ORDER — MONTELUKAST SODIUM 10 MG PO TABS
ORAL_TABLET | ORAL | Status: AC
  Filled 2019-09-21: qty 1

## 2019-09-21 MED ORDER — DIPHENHYDRAMINE HCL 50 MG/ML IJ SOLN
INTRAMUSCULAR | Status: AC
  Filled 2019-09-21: qty 1

## 2019-09-21 MED ORDER — MEPERIDINE HCL 25 MG/ML IJ SOLN
25.0000 mg | Freq: Once | INTRAMUSCULAR | Status: AC
  Administered 2019-09-21: 14:00:00 25 mg via INTRAVENOUS

## 2019-09-21 MED ORDER — PALONOSETRON HCL INJECTION 0.25 MG/5ML
0.2500 mg | Freq: Once | INTRAVENOUS | Status: AC
  Administered 2019-09-21: 0.25 mg via INTRAVENOUS

## 2019-09-21 MED ORDER — FAMOTIDINE IN NACL 20-0.9 MG/50ML-% IV SOLN
20.0000 mg | Freq: Once | INTRAVENOUS | Status: AC
  Administered 2019-09-21: 12:00:00 20 mg via INTRAVENOUS

## 2019-09-21 MED ORDER — METHYLPREDNISOLONE SODIUM SUCC 125 MG IJ SOLR
INTRAMUSCULAR | Status: AC
  Filled 2019-09-21: qty 2

## 2019-09-21 MED ORDER — DIPHENHYDRAMINE HCL 50 MG/ML IJ SOLN
50.0000 mg | Freq: Once | INTRAMUSCULAR | Status: AC
  Administered 2019-09-21: 50 mg via INTRAVENOUS

## 2019-09-21 MED ORDER — LORAZEPAM 2 MG/ML IJ SOLN
0.5000 mg | Freq: Once | INTRAMUSCULAR | Status: AC
  Administered 2019-09-21: 14:00:00 0.5 mg via INTRAVENOUS

## 2019-09-21 MED ORDER — PALONOSETRON HCL INJECTION 0.25 MG/5ML
INTRAVENOUS | Status: AC
  Filled 2019-09-21: qty 5

## 2019-09-21 MED ORDER — LORAZEPAM 2 MG/ML IJ SOLN
INTRAMUSCULAR | Status: AC
  Filled 2019-09-21: qty 1

## 2019-09-21 MED ORDER — SODIUM CHLORIDE 0.9 % IV SOLN
90.0000 mg/m2 | Freq: Once | INTRAVENOUS | Status: AC
  Administered 2019-09-21: 17:00:00 200 mg via INTRAVENOUS
  Filled 2019-09-21: qty 8

## 2019-09-21 NOTE — Progress Notes (Signed)
MAR note from [place], [place] as follows:   Rituxan stopped at 1400 for rigors.IV fluid bolus started, O2 2 liters started, [employer] PA-C to chair side.Demerol 25 mg IVP administered, followed by Alivan 0.5 mg IVP followed by Pepcid 20mg  IVP given as well.VSS and remained afebrile.

## 2019-09-21 NOTE — Progress Notes (Signed)
DATE:  09/21/2019                                          X  CHEMO/IMMUNOTHERAPY REACTION            MD:  Dr. Sullivan Lone   AGENT/BLOOD [place]:              Rituxan and Bendeka   AGENT/BLOOD PRODUCT RECEIVING IMMEDIATELY PRIOR TO REACTION:          Rituxan   VS: BP:     129/100   P:       107       SPO2:       100 % on O2 at 2 LPM via [place]                BP:     139/54   P:       102       SPO2:       100 % on O2 at 2 LPM via [place]                   REACTION(S):           Rigors   PREMEDS:     Aloxi, Demerol 50 mg, Benadryl 50 mg, remove Pepcid 20 mg: Solu-Medrol 80 mg, and Singulair 10 mg   INTERVENTION: Results was positive patient was given Demerol 25 mg IV x1, Pepcid 20 mg IV x1, and Ativan 0.5 mg IV x1.     Review of Systems  Review of Systems  Constitutional: Negative for chills, diaphoresis and fever.  HENT: Negative for trouble swallowing and voice change.   Respiratory: Negative for cough, chest tightness, shortness of breath and wheezing.   Cardiovascular: Negative for chest pain and palpitations.  Gastrointestinal: Negative for abdominal pain, constipation, diarrhea, nausea and vomiting.  Musculoskeletal: Negative for back pain and myalgias.  Skin: Positive for color change (Facial erythema).  Neurological: Positive for tremors. Negative for dizziness, light-headedness and headaches.     Physical Exam  Physical Exam Constitutional:      General: He is not in acute distress.    Appearance: He is not diaphoretic.  HENT:     Head: Normocephalic and atraumatic.  Eyes:     General: No scleral icterus.       Right eye: No discharge.        Left eye: No discharge.     Conjunctiva/sclera: Conjunctivae normal.  Cardiovascular:     Rate and Rhythm: Regular rhythm. Tachycardia present.     Heart sounds: Normal heart sounds. No murmur. No friction rub. No gallop.   Pulmonary:     Effort: Pulmonary effort is normal. No respiratory distress.     Breath  sounds: Normal breath sounds. No wheezing or rales.  Skin:    General: Skin is warm and dry.     Findings: Erythema (Facial erythema) present. No rash.  Neurological:     Mental Status: He is alert.     Comments: Rigors  Psychiatric:        Mood and Affect: Mood normal.        Behavior: Behavior normal.        Thought Content: Thought content normal.        Judgment: Judgment normal.     OUTCOME:  Rigors resolved after he was dosed with Demerol 25 mg IV, Pepcid 20 mg IV and Ativan 0.5 IV x1.  Rituximab was restarted at 100 mL/h over 30 minutes and was increased to 150 mL/h.  Verl Dicker was given at 1700.  The remainder of his rituximab will be infused tomorrow.  It was recommended by pharmacy that the patient have his future infusions of rituximab given over 2 days.  This case was discussed with Dr. Irene Limbo. He expresses agreement with my management of this patient.   Sandi Mealy, MHS, PA-C

## 2019-09-22 ENCOUNTER — Other Ambulatory Visit: Payer: Self-pay

## 2019-09-22 ENCOUNTER — Inpatient Hospital Stay: Payer: Medicare Other | Attending: Hematology

## 2019-09-22 VITALS — BP 107/59 | HR 70 | Temp 98.0°F | Resp 18

## 2019-09-22 DIAGNOSIS — Z7689 Persons encountering health services in other specified circumstances: Secondary | ICD-10-CM | POA: Insufficient documentation

## 2019-09-22 DIAGNOSIS — C8318 Mantle cell lymphoma, lymph nodes of multiple sites: Secondary | ICD-10-CM | POA: Insufficient documentation

## 2019-09-22 DIAGNOSIS — Z5111 Encounter for antineoplastic chemotherapy: Secondary | ICD-10-CM | POA: Diagnosis not present

## 2019-09-22 DIAGNOSIS — Z7189 Other specified counseling: Secondary | ICD-10-CM

## 2019-09-22 MED ORDER — SODIUM CHLORIDE 0.9 % IV SOLN
90.0000 mg/m2 | Freq: Once | INTRAVENOUS | Status: AC
  Administered 2019-09-22: 200 mg via INTRAVENOUS
  Filled 2019-09-22: qty 8

## 2019-09-22 MED ORDER — FAMOTIDINE IN NACL 20-0.9 MG/50ML-% IV SOLN
INTRAVENOUS | Status: AC
  Filled 2019-09-22: qty 50

## 2019-09-22 MED ORDER — SODIUM CHLORIDE 0.9 % IV SOLN
Freq: Once | INTRAVENOUS | Status: AC
  Filled 2019-09-22: qty 250

## 2019-09-22 MED ORDER — METHYLPREDNISOLONE SODIUM SUCC 125 MG IJ SOLR
INTRAMUSCULAR | Status: AC
  Filled 2019-09-22: qty 2

## 2019-09-22 MED ORDER — MEPERIDINE HCL 50 MG/ML IJ SOLN
INTRAMUSCULAR | Status: AC
  Filled 2019-09-22: qty 1

## 2019-09-22 MED ORDER — MONTELUKAST SODIUM 10 MG PO TABS
ORAL_TABLET | ORAL | Status: AC
  Filled 2019-09-22: qty 1

## 2019-09-22 MED ORDER — SODIUM CHLORIDE 0.9 % IV SOLN
300.0000 mg | Freq: Once | INTRAVENOUS | Status: AC
  Administered 2019-09-22: 300 mg via INTRAVENOUS
  Filled 2019-09-22: qty 30

## 2019-09-22 MED ORDER — MEPERIDINE HCL 50 MG/ML IJ SOLN
50.0000 mg | Freq: Once | INTRAMUSCULAR | Status: AC
  Administered 2019-09-22: 10:00:00 50 mg via INTRAVENOUS

## 2019-09-22 MED ORDER — ACETAMINOPHEN 500 MG PO TABS
1000.0000 mg | ORAL_TABLET | Freq: Once | ORAL | Status: AC
  Administered 2019-09-22: 1000 mg via ORAL

## 2019-09-22 MED ORDER — FAMOTIDINE IN NACL 20-0.9 MG/50ML-% IV SOLN
20.0000 mg | Freq: Once | INTRAVENOUS | Status: AC
  Administered 2019-09-22: 20 mg via INTRAVENOUS

## 2019-09-22 MED ORDER — PEGFILGRASTIM 6 MG/0.6ML ~~LOC~~ PSKT
6.0000 mg | PREFILLED_SYRINGE | Freq: Once | SUBCUTANEOUS | Status: AC
  Administered 2019-09-22: 6 mg via SUBCUTANEOUS

## 2019-09-22 MED ORDER — PEGFILGRASTIM 6 MG/0.6ML ~~LOC~~ PSKT
PREFILLED_SYRINGE | SUBCUTANEOUS | Status: AC
  Filled 2019-09-22: qty 0.6

## 2019-09-22 MED ORDER — MONTELUKAST SODIUM 10 MG PO TABS
10.0000 mg | ORAL_TABLET | Freq: Once | ORAL | Status: AC
  Administered 2019-09-22: 10 mg via ORAL

## 2019-09-22 MED ORDER — METHYLPREDNISOLONE SODIUM SUCC 125 MG IJ SOLR
80.0000 mg | Freq: Every day | INTRAMUSCULAR | Status: DC
  Administered 2019-09-22: 80 mg via INTRAVENOUS

## 2019-09-22 MED ORDER — DIPHENHYDRAMINE HCL 50 MG/ML IJ SOLN
INTRAMUSCULAR | Status: AC
  Filled 2019-09-22: qty 1

## 2019-09-22 MED ORDER — ACETAMINOPHEN 500 MG PO TABS
ORAL_TABLET | ORAL | Status: AC
  Filled 2019-09-22: qty 2

## 2019-09-22 MED ORDER — DIPHENHYDRAMINE HCL 50 MG/ML IJ SOLN
50.0000 mg | Freq: Once | INTRAMUSCULAR | Status: AC
  Administered 2019-09-22: 50 mg via INTRAVENOUS

## 2019-09-22 NOTE — Progress Notes (Signed)
Per closing nurse, a total volume of rituximab 239mL was given 09/21/19. This results in 124 mL remaining (concentration = 2.42 mg/mL, total dose remaining= ~300 mg). This volume given was taken from [facility] pump and it was verified that channel did not have any other medications infusing through it that day.   Patient will receive remaining rituximab dose of 300mg  today. Same premedications as yesterday per Dr. Irene Limbo.   In the future, Dr. Irene Limbo would like to continue to keep the patient on D1 rituximab only to avoid the need for premedications to days in a row (as opposed to up-front rituximab dose splitting over days 1 & 2). He will need to be scheduled early in the morning to account for slowed/capped rituximab infusion rate.   Demetrius Charity, PharmD, BCPS, [place] Pharmacist Pharmacy Phone: [phone] 09/22/2019

## 2019-09-22 NOTE — Patient Instructions (Signed)
[facility] Discharge Instructions for Patients Receiving Chemotherapy  Today you received the following chemotherapy agents:  Rituxan and Bendeka.  To help prevent nausea and vomiting after your treatment, we encourage you to take your nausea medication as directed.   If you develop nausea and vomiting that is not controlled by your nausea medication, call the clinic.   BELOW ARE SYMPTOMS THAT SHOULD BE REPORTED IMMEDIATELY:  *FEVER GREATER THAN 100.5 F  *CHILLS WITH OR WITHOUT FEVER  NAUSEA AND VOMITING THAT IS NOT CONTROLLED WITH YOUR NAUSEA MEDICATION  *UNUSUAL SHORTNESS OF BREATH  *UNUSUAL BRUISING OR BLEEDING  TENDERNESS IN MOUTH AND THROAT WITH OR WITHOUT PRESENCE OF ULCERS  *URINARY PROBLEMS  *BOWEL PROBLEMS  UNUSUAL RASH Items with * indicate a potential emergency and should be followed up as soon as possible.  Feel free to call the clinic should you have any questions or concerns. The clinic phone number is [phone].  Please show the CHEMO ALERT CARD at check-in to the Emergency Department and triage nurse.   

## 2019-09-23 ENCOUNTER — Telehealth: Payer: Self-pay | Admitting: Hematology

## 2019-09-23 NOTE — Telephone Encounter (Signed)
Scheduled per 03/31 los, called and spoke with patient's relative regarding patient's upcoming appointments.

## 2019-10-13 NOTE — Progress Notes (Signed)
Pharmacist Chemotherapy Monitoring - Follow Up Assessment    I verify that I have reviewed each item in the below checklist:  . Regimen for the patient is scheduled for the appropriate day and plan matches scheduled date. Marland Kitchen Appropriate non-routine labs are ordered dependent on drug ordered. . If applicable, additional medications reviewed and ordered per protocol based on lifetime cumulative doses and/or treatment regimen.   Plan for follow-up and/or issues identified: No . I-vent associated with next due treatment: No . MD and/or nursing notified: No  Champ Keetch D 10/13/2019 3:32 PM

## 2019-10-14 NOTE — Progress Notes (Signed)
Pharmacist Chemotherapy Monitoring - Follow Up Assessment    I verify that I have reviewed each item in the below checklist:  . Regimen for the patient is scheduled for the appropriate day and plan matches scheduled date. Marland Kitchen Appropriate non-routine labs are ordered dependent on drug ordered. . If applicable, additional medications reviewed and ordered per protocol based on lifetime cumulative doses and/or treatment regimen.   Plan for follow-up and/or issues identified: No . I-vent associated with next due treatment: No . MD and/or nursing notified: No  Dean Dixon D 10/14/2019 4:17 PM n n

## 2019-10-18 ENCOUNTER — Other Ambulatory Visit: Payer: Self-pay

## 2019-10-18 DIAGNOSIS — C8318 Mantle cell lymphoma, lymph nodes of multiple sites: Secondary | ICD-10-CM

## 2019-10-18 NOTE — Progress Notes (Signed)
Dr. Irene Limbo changed the order of treatment for this week for Bendamustine on Day 1 and then Rituxan/Bendamustine on Day 2 due to appointment times and potential for reactions later in the day. Appointments changed.

## 2019-10-18 NOTE — Progress Notes (Signed)
Due to pts schedule & arrival time on 4/28, pt will get Bendamustine on 4/28 and RTX/Bendamustine on 4/29. Pt has h/o reactions w/ RTX and need more time for infusion. Orders updated after discussion w/ Smiley Houseman, RN.  She obtained ok from Dr. Irene Limbo.  Kennith Center, Pharm.D., CPP 10/18/2019@5 :09 PM

## 2019-10-19 ENCOUNTER — Ambulatory Visit: Payer: Medicare Other

## 2019-10-19 ENCOUNTER — Inpatient Hospital Stay: Payer: Medicare Other

## 2019-10-19 ENCOUNTER — Inpatient Hospital Stay (HOSPITAL_BASED_OUTPATIENT_CLINIC_OR_DEPARTMENT_OTHER): Payer: Medicare Other | Admitting: Hematology

## 2019-10-19 ENCOUNTER — Other Ambulatory Visit: Payer: Self-pay

## 2019-10-19 VITALS — BP 132/59 | HR 81 | Temp 98.7°F | Resp 18 | Ht 64.0 in | Wt 205.7 lb

## 2019-10-19 DIAGNOSIS — Z7689 Persons encountering health services in other specified circumstances: Secondary | ICD-10-CM | POA: Diagnosis not present

## 2019-10-19 DIAGNOSIS — C8318 Mantle cell lymphoma, lymph nodes of multiple sites: Secondary | ICD-10-CM | POA: Diagnosis not present

## 2019-10-19 DIAGNOSIS — D696 Thrombocytopenia, unspecified: Secondary | ICD-10-CM | POA: Diagnosis not present

## 2019-10-19 DIAGNOSIS — Z5111 Encounter for antineoplastic chemotherapy: Secondary | ICD-10-CM | POA: Diagnosis not present

## 2019-10-19 LAB — CMP (CANCER CENTER ONLY)
ALT: 28 U/L (ref 0–44)
AST: 21 U/L (ref 15–41)
Albumin: 3.7 g/dL (ref 3.5–5.0)
Alkaline Phosphatase: 74 U/L (ref 38–126)
Anion gap: 12 (ref 5–15)
BUN: 20 mg/dL (ref 8–23)
CO2: 24 mmol/L (ref 22–32)
Calcium: 9.1 mg/dL (ref 8.9–10.3)
Chloride: 109 mmol/L (ref 98–111)
Creatinine: 0.87 mg/dL (ref 0.61–1.24)
GFR, Est AFR Am: >60 mL/min (ref >60)
GFR, Estimated: >60 mL/min (ref >60)
Glucose, Bld: 136 mg/dL — ABNORMAL HIGH (ref 70–99)
Potassium: 3.3 mmol/L — ABNORMAL LOW (ref 3.5–5.1)
Sodium: 145 mmol/L (ref 135–145)
Total Bilirubin: 0.6 mg/dL (ref 0.3–1.2)
Total Protein: 6.2 g/dL — ABNORMAL LOW (ref 6.5–8.1)

## 2019-10-19 LAB — CBC WITH DIFFERENTIAL (CANCER CENTER ONLY)
Abs Immature Granulocytes: 0.07 10*3/uL (ref 0.00–0.07)
Basophils Absolute: 0 10*3/uL (ref 0.0–0.1)
Basophils Relative: 1 %
Eosinophils Absolute: 0.1 10*3/uL (ref 0.0–0.5)
Eosinophils Relative: 4 %
HCT: 26.5 % — ABNORMAL LOW (ref 39.0–52.0)
Hemoglobin: 8.8 g/dL — ABNORMAL LOW (ref 13.0–17.0)
Immature Granulocytes: 2 %
Lymphocytes Relative: 9 %
Lymphs Abs: 0.4 10*3/uL — ABNORMAL LOW (ref 0.7–4.0)
MCH: 30.6 pg (ref 26.0–34.0)
MCHC: 33.2 g/dL (ref 30.0–36.0)
MCV: 92 fL (ref 80.0–100.0)
Monocytes Absolute: 0.2 10*3/uL (ref 0.1–1.0)
Monocytes Relative: 5 %
Neutro Abs: 3.2 10*3/uL (ref 1.7–7.7)
Neutrophils Relative %: 79 %
Platelet Count: 53 10*3/uL — ABNORMAL LOW (ref 150–400)
RBC: 2.88 MIL/uL — ABNORMAL LOW (ref 4.22–5.81)
RDW: 17.8 % — ABNORMAL HIGH (ref 11.5–15.5)
WBC Count: 4 10*3/uL (ref 4.0–10.5)
nRBC: 0 % (ref 0.0–0.2)

## 2019-10-19 LAB — LACTATE DEHYDROGENASE: LDH: 168 U/L (ref 98–192)

## 2019-10-19 NOTE — Progress Notes (Signed)
HEMATOLOGY/ONCOLOGY CLINIC NOTE  Date of Service: 10/19/2019  Patient Care Team: Hamrick, Lorin Mercy, MD as PCP - General (Family Medicine)  CHIEF COMPLAINTS/PURPOSE OF CONSULTATION:  F/u for recently diagnosed mantle cell lymphoma  HISTORY OF PRESENTING ILLNESS:   Dean Dixon is a wonderful 69 y.o. male who has been referred to [place] by Dr Daiva Eves for evaluation and management of Lymphocytosis. The pt reports that he is doing well overall.   The pt reports that he has had a high lymphocyte count for several years but it has never been as high as now. 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 This most recent lymphocytosis was picked up by him looking at his own blood in the lab. He has not felt any differently in the last 6 months to 1 year. Pt has arthritis, tendonitis and bursitis in his shoulder. He has not had any concerns of RA and no significantly swollen joints. Pt tore an achilles tendon and then flew on a plane which lead to a blood clot. He has been on a testosterone supplement before but has not used it in a while. Pt has had diverticulitis. He has been losing weight slowly by eating better but has not noticed any unexpected weight loss. Pt does not smoke, does not share needles and has not needed any blood transfusions in the past. Pt denies any respiratory illness or infection symptoms in the last few months. He used to have an elevated IgM level about 30 years ago. He does have several fatty lipomas, a few which he has had removed, but there have been no concerns associated with them. He has recently stopped taking herbal supplements until he can figure out what is going on with his blood counts. He has been taking Glucosamine, Chondroitin & MSM as well as a 25 mg CBD gummy and 10 mg of CBD SL for his joint pain for a few years. He is currently taking Glipizide and Metformin. The Metformin did give the pt diarrhea, which has since resolved.   Most recent lab results (04/12/19) of  CBC w/diff and CMP is as follows: all values are WNL except for Glucose at 191, WBC at 30.5K, Lymphs Abs at 19.6K, Mono Abs at 3.6K, Eos Abs at 0.5K, Baso Abs at 0.3K, Abs Immature Granulocytes at 0.3K. 04/12/2019 PSA at 2.2  On review of systems, pt reports joint pain and denies rashes, fevers, chills, night sweats, unexpected weight loss, issues with swallowing, abdominal pain, bowel movement issues, leg pain, respiratory symptoms, diarrhea and any other symptoms.   On PMHx the pt reports HTN, Diabetes, Arthritis, Diverticulitis. On Social Hx the pt reports that he is a non-smoker.  INTERVAL HISTORY:   Dean Dixon is a wonderful 69 y.o. male who is here for evaluation and management of Lymphocytosis. He is here for C5D1 of Bendamustine + Rituxan. The patient's last visit with [place] was on 09/21/2019. The pt reports that he is doing well overall.  The pt reports that he has been well, but has some diarrhea that is well-controlled with Imodium. He has continued using Metformin to help control his DM II. Pt is still able to work his regular schedule and has the energy to compete his daily activities.   Pt feels sick for about 5 days after his Neulasta injection.   Pt is planning on taking a cruise at the end of [place] of August.   Lab results today (10/19/19) of CBC w/diff and CMP is as  follows: all values are WNL except for RBC at 2.88, Hgb at 8.8, HCT at 26.5, RDW at 17.8, PLT at 53K, Lymphs Abs at 0.4K, Potassium at 3.3, Glucose at 136, Total Protein at 6.2. 10/19/2019 LDH at 168  On review of systems, pt reports diarrhea and denies fevers, chills, night sweats, fatigue, leg swelling, new lumps/bumps and any other symptoms.    MEDICAL HISTORY:  Past Medical History:  Diagnosis Date  . Diabetes mellitus without complication ([place])   . Hypertension     SURGICAL HISTORY: Past Surgical History:  Procedure Laterality Date  . APPENDECTOMY    . TONSILLECTOMY      SOCIAL  HISTORY: Social History   Socioeconomic History  . Marital status: Divorced    Spouse name: Not on file  . Number of children: Not on file  . Years of education: Not on file  . Highest education level: Not on file  Occupational History  . Not on file  Tobacco Use  . Smoking status: Never Smoker  . Smokeless tobacco: Never Used  Substance and Sexual Activity  . Alcohol use: Yes    Alcohol/week: 2.0 standard drinks    Types: 2 Standard drinks or equivalent per week  . Drug use: No  . Sexual activity: Not on file  Other Topics Concern  . Not on file  Social History Narrative  . Not on file   Social Determinants of Health   Financial Resource Strain:   . Difficulty of Paying Living Expenses:   Food Insecurity:   . Worried About Charity fundraiser in the Last Year:   . Arboriculturist in the Last Year:   Transportation Needs:   . Film/video editor (Medical):   Marland Kitchen Lack of Transportation (Non-Medical):   Physical Activity:   . Days of Exercise per Week:   . Minutes of Exercise per Session:   Stress:   . Feeling of Stress :   Social Connections:   . Frequency of Communication with Friends and Family:   . Frequency of Social Gatherings with Friends and Family:   . Attends Religious Services:   . Active Member of Clubs or Organizations:   . Attends Archivist Meetings:   Marland Kitchen Marital Status:   Intimate Partner Violence:   . Fear of Current or Ex-Partner:   . Emotionally Abused:   Marland Kitchen Physically Abused:   . Sexually Abused:     FAMILY HISTORY: No family history on file.  ALLERGIES:  is allergic to ivp dye [iodinated diagnostic agents].  MEDICATIONS:  Current Outpatient Medications  Medication Sig Dispense Refill  . acyclovir (ZOVIRAX) 400 MG tablet Take 1 tablet (400 mg total) by mouth daily. 30 tablet 3  . allopurinol (ZYLOPRIM) 100 MG tablet Take 1 tablet (100 mg total) by mouth 2 (two) times daily. 60 tablet 0  . amLODipine (NORVASC) 10 MG tablet Take  10 mg by mouth daily.    . Ascorbic Acid (VITAMIN C) 500 MG CAPS Take 1 capsule by mouth daily.    Marland Kitchen aspirin 325 MG tablet Take 325 mg by mouth as needed.    Marland Kitchen CANNABIDIOL PO Take 30 mg by mouth daily.    . Coenzyme Q10 (COQ-10 PO) Take 1 capsule by mouth daily. Takes CoQ 10 w/cinnamon    . dexamethasone (DECADRON) 4 MG tablet Take 2 tablets (8 mg total) by mouth daily. Start the day after bendamustine chemotherapy for 2 days. Take with food. 30 tablet 1  .  glipiZIDE (GLUCOTROL XL) 10 MG 24 hr tablet Take 20 mg by mouth every morning.    . Glucosamine-Chondroitin (GLUCOSAMINE CHONDR COMPLEX PO) Take 1 tablet by mouth daily.    Marland Kitchen LORazepam (ATIVAN) 0.5 MG tablet Take 1 tablet (0.5 mg total) by mouth every 6 (six) hours as needed (Nausea or vomiting). 30 tablet 0  . metFORMIN (GLUCOPHAGE-XR) 500 MG 24 hr tablet Take 500 mg by mouth daily.    . Multiple Vitamin (MULTIVITAMIN) tablet Take 1 tablet by mouth daily.    . ondansetron (ZOFRAN) 8 MG tablet Take 1 tablet (8 mg total) by mouth 2 (two) times daily as needed for refractory nausea / vomiting. Start on day 2 after bendamustine chemotherapy. 30 tablet 1  . prochlorperazine (COMPAZINE) 10 MG tablet Take 1 tablet (10 mg total) by mouth every 6 (six) hours as needed (Nausea or vomiting). 30 tablet 1  . ramipril (ALTACE) 10 MG capsule Take 10 mg by mouth daily.    Marland Kitchen triamterene-hydrochlorothiazide (MAXZIDE-25) 37.5-25 MG per tablet Take 1 tablet by mouth daily.     No current facility-administered medications for this visit.    REVIEW OF SYSTEMS:   A 10+ POINT REVIEW OF SYSTEMS WAS OBTAINED including neurology, dermatology, psychiatry, cardiac, respiratory, lymph, extremities, GI, GU, Musculoskeletal, constitutional, breasts, reproductive, HEENT.  All pertinent positives are noted in the HPI.  All others are negative.   PHYSICAL EXAMINATION: ECOG PERFORMANCE STATUS: 0 - Asymptomatic  . Vitals:   10/19/19 1020  BP: (!) 132/59  Pulse: 81   Resp: 18  Temp: 98.7 F (37.1 C)  SpO2: 99%   Filed Weights   10/19/19 1020  Weight: 205 lb 11.2 oz (93.3 kg)   .Body mass index is 35.31 kg/m.   GENERAL:alert, in no acute distress and comfortable SKIN: no acute rashes, no significant lesions EYES: conjunctiva are pink and non-injected, sclera anicteric OROPHARYNX: MMM, no exudates, no oropharyngeal erythema or ulceration NECK: supple, no JVD LYMPH:  no palpable lymphadenopathy in the cervical, axillary or inguinal regions LUNGS: clear to auscultation b/l with normal respiratory effort HEART: regular rate & rhythm ABDOMEN:  normoactive bowel sounds , non tender, not distended. No palpable hepatosplenomegaly.  Extremity: no pedal edema PSYCH: alert & oriented x 3 with fluent speech NEURO: no focal motor/sensory deficits  LABORATORY DATA:  I have reviewed the data as listed  . CBC Latest Ref Rng & Units 10/19/2019 09/21/2019 08/24/2019  WBC 4.0 - 10.5 K/uL 4.0 4.2 3.5(L)  Hemoglobin 13.0 - 17.0 g/dL 8.8(L) 9.9(L) 11.1(L)  Hematocrit 39.0 - 52.0 % 26.5(L) 28.8(L) 34.2(L)  Platelets 150 - 400 K/uL 53(L) 99(L) 129(L)    . CMP Latest Ref Rng & Units 10/19/2019 09/21/2019 08/24/2019  Glucose 70 - 99 mg/dL 136(H) 265(H) 263(H)  BUN 8 - 23 mg/dL '20 21 12  '$ Creatinine 0.61 - 1.24 mg/dL 0.87 1.25(H) 0.89  Sodium 135 - 145 mmol/L 145 141 141  Potassium 3.5 - 5.1 mmol/L 3.3(L) 4.1 3.7  Chloride 98 - 111 mmol/L 109 108 107  CO2 22 - 32 mmol/L 24 19(L) 26  Calcium 8.9 - 10.3 mg/dL 9.1 9.1 8.5(L)  Total Protein 6.5 - 8.1 g/dL 6.2(L) 6.5 5.8(L)  Total Bilirubin 0.3 - 1.2 mg/dL 0.6 0.5 0.4  Alkaline Phos 38 - 126 U/L 74 82 85  AST 15 - 41 U/L '21 23 18  '$ ALT 0 - 44 U/L '28 26 23   '$ 06/10/2019 Flow Pathology Report ([id number]):   06/10/2019 Surgical Pathology ([id number]):  Surgical Pathology  CASE: [id number]  PATIENT: Dean Dixon  Flow Pathology Report      Clinical history: Lymphocytosis      DIAGNOSIS:    -Abnormal B-cell population identified.  -See comment   COMMENT:   Analysis of the lymphoid population shows a major abnormal B-cell  population representing 74% of all lymphocytes with expression of  HLA-Dr, CD20 and CD5. This is associated with extremely dim/negative  staining for surface immunoglobulin light chains hindering assessment  and or quantitation of clonality. No CD10, CD38, or CD34 expression is  identified. The findings are most consistent with a B-cell  lymphoproliferative process. Examination of peripheral blood shows  predominance of medium and large atypical lymphoid cells characterized  by coarse chromatin, moderately abundant agranular blue cytoplasm and  occasional cells with small nucleoli. Consideration should be given to  chronic lymphocytic leukemia variant, mantle cell lymphoma, as well as  other CD5 positive B-cell lymphoproliferative processes. FISH studies  may be helpful in this regard.   GATING AND PHENOTYPIC ANALYSIS:   Gated population: Flow cytometric immunophenotyping is performed using  antibodies to the antigens listed in the table below. Electronic gates  are placed around a cell cluster displaying light scatter properties  corresponding to: lymphocytes   Abnormal Cells in gated population: 74%   Phenotype of Abnormal Cells: CD5, CD20, HLA-Dr    04/26/2019 Flow Cytometry [id number]):    05/02/2019 FISH:    WBC Neutros Lymphs  02/05/2017 9.9K 6.0K 2.6K  10/06/2017 12.6K 7.7K 3.2K  11/02/2018 12.3K 5.2K 5.6K    RADIOGRAPHIC STUDIES: I have personally reviewed the radiological images as listed and agreed with the findings in the report. NM PET Image Restag (PS) Skull Base To Thigh  Result Date: 09/19/2019 CLINICAL DATA:  Subsequent treatment strategy for mantle cell lymphoma. EXAM: NUCLEAR MEDICINE PET SKULL BASE TO THIGH TECHNIQUE: 10.34 mCi F-18 FDG was injected intravenously. Full-ring PET imaging was performed from the  skull base to thigh after the radiotracer. CT data was obtained and used for attenuation correction and anatomic localization. Fasting blood glucose: 205 mg/dl COMPARISON:  PET-CT 05/31/2019 FINDINGS: Mediastinal blood pool activity: SUV max 3.46 Liver activity: SUV max 4.09 NECK: Small bilateral level 2 and level 3 lymph nodes are noted bilaterally. Interval decrease in FDG uptake when compared to the prior study. Right-sided node on image 37/4 has an SUV max of 4.42 and was previously 5.35. Left-sided node on image 33/4 has an SUV max of 3.25. This was previously 5.83. No new or progressive findings. Incidental CT findings: none CHEST: Small scattered bilateral axillary lymph nodes are again demonstrated. Index right-sided axillary lymph node on image 63/4 measures 7.5 mm and previously measured 8.5 mm. SUV max is 2.98 and was previously 3.18. 13 mm left axillary node on image number 63/4 previously measured 17 mm. SUV max is 3.67 and was previously 4.23. No new or progressive findings. No enlarged or hypermetabolic mediastinal or hilar lymph nodes. Incidental CT findings: No significant pulmonary findings. ABDOMEN/PELVIS: Stable mild splenomegaly. Mild diffuse hypermetabolism is also again demonstrated with SUV max of 6.0. This was previously 5.4. No hepatic lesions are identified. Small scattered retroperitoneal lymph nodes are slightly smaller. 9 mm node on image 124/4 previously measured 11.5 mm. SUV max is 4.31 and was previously 4.63 other smaller adjacent lymph nodes are slightly smaller and demonstrates slightly lower SUV max. 12 mm right external iliac lymph node on image 170/4 has an SUV max of 4.17 was previously 4.88. 10 mm left  external iliac lymph node on image 169/4 previously measured 14.5 mm. SUV max today is 3.85 and was previously 5.89. No new or progressive findings. Incidental CT findings: none SKELETON: No focal hypermetabolic activity to suggest skeletal metastasis. Incidental CT findings:  none IMPRESSION: 1. Interval slight decrease in size and decreased FDG uptake in the scattered nodes in the neck, axilla, retroperitoneum and pelvis. Deauville 3 and 4. No new or progressive findings. 2. Stable splenomegaly and mild diffuse hypermetabolism. 3. No osseous involvement. Electronically Signed   By: Marijo Sanes M.D.   On: 09/19/2019 12:48    ASSESSMENT & PLAN:   69 yo male with   1) Progressively increasing Lymphocytosis concerning for a lymphoproliferative. CD5+ve lymphoproliferative disorder likely CLL less likely Mantle cell lymphoma CLL FISH shows 17p mutation -04/26/2019 Flow Cytometry [phone]) revealed "Abnormal B-cell population identified. CD5+ clonal lymphoproliferative disorder" -04/26/2019 FISH revealed "Positive for p53 (17p13) deletion." -05/31/2019 PET/CT ([id number]) revealed "1. There are multiple prominent lymph nodes in the neck, both axilla, the retroperitoneum and pelvis with low level hypermetabolic activity consistent with chronic lymphocytic leukemia. Deauville 3 and 4. 2. Mild splenomegaly and mild generalized splenic hypermetabolic activity 3. No evidence of bone marrow involvement." -06/10/2019 Flow Pathology Report ([id number]) revealed "Abnormal B-cell population." -06/10/2019 Surgical Pathology Report ([id number])  revealed "Atypical lymphoid proliferation consistent with mantle cell lymphoma." -09/19/19 PET Skull Base to Thigh ([id number])-- favorable response to current treatment  2) Rigors from Rituxan - needing demerol as premedication. Rituxan rate to be capped at '150mg'$ /hour  3) Thrombocytopenia from chemotherapy PLAN: -Discussed pt labwork today, 10/19/19; anemia, thrombocytopenia, blood glucose is improved, Potassium is low, LDH is WNL -Due to PLT <75-80K will hold Bendamustine today. Will hold for 3 weeks to give PLT an opportunity to bounce back.  -The pt has no prohibitive toxicities from continuing Rituxan tomorrow.  -Advised  pt that we will repeat PET/CT after C6 -Discussed that after induction therapy we will continue maintenance Rituxan until progression or intolerance for up to three years -Continue to take Glipizide and Metformin as prescribed  -Pt will get CBC w/diff with PCP in 2 weeks  -Will see back in 3 weeks with labs      FOLLOW UP: Please schedule next cycle BR C5D1 and D2 in 3 weeks  Labs and MD visit on C5D1   The total time spent in the appt was 20 minutes and more than 50% was on counseling and direct patient cares.  All of the patient's questions were answered with apparent satisfaction. The patient knows to call the clinic with any problems, questions or concerns.    Sullivan Lone MD [place] AAHIVMS [facility] Hematology/Oncology Physician [facility]  (Office):       [phone] (Work cell):  [phone] (Fax):           [phone]  10/19/2019 10:54 AM  I, Yevette Edwards, am acting as a scribe for Dr. Sullivan Lone.   .I have reviewed the above documentation for accuracy and completeness, and I agree with the above. Brunetta Genera MD

## 2019-10-20 ENCOUNTER — Other Ambulatory Visit: Payer: Self-pay | Admitting: Medical

## 2019-10-20 ENCOUNTER — Other Ambulatory Visit: Payer: Self-pay

## 2019-10-20 ENCOUNTER — Inpatient Hospital Stay: Payer: Medicare Other

## 2019-10-20 ENCOUNTER — Other Ambulatory Visit: Payer: Self-pay | Admitting: Hematology

## 2019-10-20 VITALS — BP 120/59 | HR 77 | Temp 98.2°F | Resp 18

## 2019-10-20 DIAGNOSIS — C8318 Mantle cell lymphoma, lymph nodes of multiple sites: Secondary | ICD-10-CM | POA: Diagnosis not present

## 2019-10-20 DIAGNOSIS — Z5111 Encounter for antineoplastic chemotherapy: Secondary | ICD-10-CM | POA: Diagnosis not present

## 2019-10-20 DIAGNOSIS — Z7189 Other specified counseling: Secondary | ICD-10-CM

## 2019-10-20 DIAGNOSIS — Z7689 Persons encountering health services in other specified circumstances: Secondary | ICD-10-CM | POA: Diagnosis not present

## 2019-10-20 MED ORDER — MONTELUKAST SODIUM 10 MG PO TABS
10.0000 mg | ORAL_TABLET | Freq: Once | ORAL | Status: AC
  Administered 2019-10-20: 10 mg via ORAL

## 2019-10-20 MED ORDER — METHYLPREDNISOLONE SODIUM SUCC 125 MG IJ SOLR
INTRAMUSCULAR | Status: AC
  Filled 2019-10-20: qty 2

## 2019-10-20 MED ORDER — MONTELUKAST SODIUM 10 MG PO TABS
ORAL_TABLET | ORAL | Status: AC
  Filled 2019-10-20: qty 1

## 2019-10-20 MED ORDER — FAMOTIDINE IN NACL 20-0.9 MG/50ML-% IV SOLN
20.0000 mg | Freq: Once | INTRAVENOUS | Status: AC
  Administered 2019-10-20: 10:00:00 20 mg via INTRAVENOUS

## 2019-10-20 MED ORDER — PEGFILGRASTIM 6 MG/0.6ML ~~LOC~~ PSKT: 6.0000 mg | PREFILLED_SYRINGE | Freq: Once | SUBCUTANEOUS | Status: DC

## 2019-10-20 MED ORDER — MEPERIDINE HCL 50 MG/ML IJ SOLN
50.0000 mg | Freq: Once | INTRAMUSCULAR | Status: AC
  Administered 2019-10-20: 50 mg via INTRAVENOUS

## 2019-10-20 MED ORDER — ACETAMINOPHEN 500 MG PO TABS
1000.0000 mg | ORAL_TABLET | Freq: Once | ORAL | Status: AC
  Administered 2019-10-20: 1000 mg via ORAL

## 2019-10-20 MED ORDER — PALONOSETRON HCL INJECTION 0.25 MG/5ML
0.2500 mg | Freq: Once | INTRAVENOUS | Status: AC
  Administered 2019-10-20: 0.25 mg via INTRAVENOUS

## 2019-10-20 MED ORDER — ACETAMINOPHEN 500 MG PO TABS
ORAL_TABLET | ORAL | Status: AC
  Filled 2019-10-20: qty 2

## 2019-10-20 MED ORDER — SODIUM CHLORIDE 0.9 % IV SOLN
375.0000 mg/m2 | Freq: Once | INTRAVENOUS | Status: AC
  Administered 2019-10-20: 800 mg via INTRAVENOUS
  Filled 2019-10-20: qty 50

## 2019-10-20 MED ORDER — FAMOTIDINE IN NACL 20-0.9 MG/50ML-% IV SOLN
INTRAVENOUS | Status: AC
  Filled 2019-10-20: qty 50

## 2019-10-20 MED ORDER — METHYLPREDNISOLONE SODIUM SUCC 125 MG IJ SOLR
80.0000 mg | Freq: Every day | INTRAMUSCULAR | Status: DC
  Administered 2019-10-20: 80 mg via INTRAVENOUS

## 2019-10-20 MED ORDER — DIPHENHYDRAMINE HCL 50 MG/ML IJ SOLN
50.0000 mg | Freq: Once | INTRAMUSCULAR | Status: AC
  Administered 2019-10-20: 50 mg via INTRAVENOUS

## 2019-10-20 MED ORDER — DIPHENHYDRAMINE HCL 50 MG/ML IJ SOLN
INTRAMUSCULAR | Status: AC
  Filled 2019-10-20: qty 1

## 2019-10-20 MED ORDER — MEPERIDINE HCL 50 MG/ML IJ SOLN
INTRAMUSCULAR | Status: AC
  Filled 2019-10-20: qty 1

## 2019-10-20 MED ORDER — SODIUM CHLORIDE 0.9 % IV SOLN
Freq: Once | INTRAVENOUS | Status: AC
  Filled 2019-10-20: qty 250

## 2019-10-20 MED ORDER — PALONOSETRON HCL INJECTION 0.25 MG/5ML
INTRAVENOUS | Status: AC
  Filled 2019-10-20: qty 5

## 2019-10-20 NOTE — Progress Notes (Signed)
Dr. Irene Limbo aware of lab results from 4/28 and ordered US to proceed with Ruxience tx today, NO Bendeka day 1 or 2

## 2019-11-04 NOTE — Progress Notes (Signed)
Pharmacist Chemotherapy Monitoring - Follow Up Assessment    I verify that I have reviewed each item in the below checklist:  . Regimen for the patient is scheduled for the appropriate day and plan matches scheduled date. Marland Kitchen Appropriate non-routine labs are ordered dependent on drug ordered. . If applicable, additional medications reviewed and ordered per protocol based on lifetime cumulative doses and/or treatment regimen.   Plan for follow-up and/or issues identified: No . I-vent associated with next due treatment: Yes . MD and/or nursing notified: No   Kennith Center, Pharm.D., CPP 11/04/2019@3 :51 PM

## 2019-11-07 DIAGNOSIS — D6959 Other secondary thrombocytopenia: Secondary | ICD-10-CM | POA: Diagnosis not present

## 2019-11-07 NOTE — Progress Notes (Signed)
Pharmacist Chemotherapy Monitoring - Follow Up Assessment    I verify that I have reviewed each item in the below checklist:  . Regimen for the patient is scheduled for the appropriate day and plan matches scheduled date. Marland Kitchen Appropriate non-routine labs are ordered dependent on drug ordered. . If applicable, additional medications reviewed and ordered per protocol based on lifetime cumulative doses and/or treatment regimen.   Plan for follow-up and/or issues identified: No . I-vent associated with next due treatment: No . MD and/or nursing notified: No  Makaia Rappa K 11/07/2019 8:54 AM

## 2019-11-08 ENCOUNTER — Telehealth: Payer: Self-pay | Admitting: Hematology

## 2019-11-08 NOTE — Telephone Encounter (Signed)
Called patient regarding upcoming appointments, left a voicemail. 

## 2019-11-09 ENCOUNTER — Inpatient Hospital Stay: Payer: Medicare Other | Admitting: Hematology

## 2019-11-09 ENCOUNTER — Other Ambulatory Visit: Payer: Self-pay | Admitting: *Deleted

## 2019-11-09 DIAGNOSIS — C8318 Mantle cell lymphoma, lymph nodes of multiple sites: Secondary | ICD-10-CM

## 2019-11-09 NOTE — Progress Notes (Signed)
HEMATOLOGY/ONCOLOGY CLINIC NOTE  Date of Service: 11/10/2019  Patient Care Team: Hamrick, Lorin Mercy, MD as PCP - General (Family Medicine)  CHIEF COMPLAINTS/PURPOSE OF CONSULTATION:  F/u for recently diagnosed mantle cell lymphoma  HISTORY OF PRESENTING ILLNESS:   Dean Dixon is a wonderful 69 y.o. male who has been referred to [place] by Dr Daiva Eves for evaluation and management of Lymphocytosis. The pt reports that he is doing well overall.   The pt reports that he has had a high lymphocyte count for several years but it has never been as high as now. 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 This most recent lymphocytosis was picked up by him looking at his own blood in the lab. He has not felt any differently in the last 6 months to 1 year. Pt has arthritis, tendonitis and bursitis in his shoulder. He has not had any concerns of RA and no significantly swollen joints. Pt tore an achilles tendon and then flew on a plane which lead to a blood clot. He has been on a testosterone supplement before but has not used it in a while. Pt has had diverticulitis. He has been losing weight slowly by eating better but has not noticed any unexpected weight loss. Pt does not smoke, does not share needles and has not needed any blood transfusions in the past. Pt denies any respiratory illness or infection symptoms in the last few months. He used to have an elevated IgM level about 30 years ago. He does have several fatty lipomas, a few which he has had removed, but there have been no concerns associated with them. He has recently stopped taking herbal supplements until he can figure out what is going on with his blood counts. He has been taking Glucosamine, Chondroitin & MSM as well as a 25 mg CBD gummy and 10 mg of CBD SL for his joint pain for a few years. He is currently taking Glipizide and Metformin. The Metformin did give the pt diarrhea, which has since resolved.   Most recent lab results (04/12/19) of  CBC w/diff and CMP is as follows: all values are WNL except for Glucose at 191, WBC at 30.5K, Lymphs Abs at 19.6K, Mono Abs at 3.6K, Eos Abs at 0.5K, Baso Abs at 0.3K, Abs Immature Granulocytes at 0.3K. 04/12/2019 PSA at 2.2  On review of systems, pt reports joint pain and denies rashes, fevers, chills, night sweats, unexpected weight loss, issues with swallowing, abdominal pain, bowel movement issues, leg pain, respiratory symptoms, diarrhea and any other symptoms.   On PMHx the pt reports HTN, Diabetes, Arthritis, Diverticulitis. On Social Hx the pt reports that he is a non-smoker.  INTERVAL HISTORY:   Dean Dixon is a wonderful 69 y.o. male who is here for evaluation and management of Lymphocytosis. He is here for C6D1 of Bendamustine + Rituxan. The patient's last visit with [place] was on 10/19/19. The pt reports that he is doing well overall.  The pt reports he is good. Pt reported a fever that lasted about a 5 days an was onset about a week after the last office visit. The fever's got up to about 102 degrees and he controlled the fever with aspirin. Pt is currently SOB and it started about a week ago. Today his SOB is slightly better. He has not been around anyone with an infection recently. Pt has had both doses of the COVID19 vaccine.   Lab results today (11/10/19) of CBC w/diff and  CMP is as follows: all values are WNL except for RBC at 3.10, Hemoglobin at 8.9, HCT at 27.5, RDW at 17.3, nRBC at 0.4, Lymphs Abs at 0.6K, Abs Immature Granulocytes at 0.37K, CO2 at 21, Glucose at 208, Calcium at 8.7, Total Protein at 5.9, Albumin at 3.1  On review of systems, pt reports SOB, fever and denies urinary symptoms, diahrrea, respiratory symptoms, cough, phlegm production, chest pain, pedal edema, skin rashes, abdominal pain and any other symptoms.   MEDICAL HISTORY:  Past Medical History:  Diagnosis Date  . Diabetes mellitus without complication ([place])   . Hypertension     SURGICAL HISTORY: Past  Surgical History:  Procedure Laterality Date  . APPENDECTOMY    . TONSILLECTOMY      SOCIAL HISTORY: Social History   Socioeconomic History  . Marital status: Divorced    Spouse name: Not on file  . Number of children: Not on file  . Years of education: Not on file  . Highest education level: Not on file  Occupational History  . Not on file  Tobacco Use  . Smoking status: Never Smoker  . Smokeless tobacco: Never Used  Substance and Sexual Activity  . Alcohol use: Yes    Alcohol/week: 2.0 standard drinks    Types: 2 Standard drinks or equivalent per week  . Drug use: No  . Sexual activity: Not on file  Other Topics Concern  . Not on file  Social History Narrative  . Not on file   Social Determinants of Health   Financial Resource Strain:   . Difficulty of Paying Living Expenses:   Food Insecurity:   . Worried About Charity fundraiser in the Last Year:   . Arboriculturist in the Last Year:   Transportation Needs:   . Film/video editor (Medical):   Marland Kitchen Lack of Transportation (Non-Medical):   Physical Activity:   . Days of Exercise per Week:   . Minutes of Exercise per Session:   Stress:   . Feeling of Stress :   Social Connections:   . Frequency of Communication with Friends and Family:   . Frequency of Social Gatherings with Friends and Family:   . Attends Religious Services:   . Active Member of Clubs or Organizations:   . Attends Archivist Meetings:   Marland Kitchen Marital Status:   Intimate Partner Violence:   . Fear of Current or Ex-Partner:   . Emotionally Abused:   Marland Kitchen Physically Abused:   . Sexually Abused:     FAMILY HISTORY: No family history on file.  ALLERGIES:  is allergic to ivp dye [iodinated diagnostic agents].  MEDICATIONS:  Current Outpatient Medications  Medication Sig Dispense Refill  . acyclovir (ZOVIRAX) 400 MG tablet Take 1 tablet (400 mg total) by mouth daily. 30 tablet 3  . allopurinol (ZYLOPRIM) 100 MG tablet Take 1 tablet  (100 mg total) by mouth 2 (two) times daily. 60 tablet 0  . amLODipine (NORVASC) 10 MG tablet Take 10 mg by mouth daily.    . Ascorbic Acid (VITAMIN C) 500 MG CAPS Take 1 capsule by mouth daily.    Marland Kitchen aspirin 325 MG tablet Take 325 mg by mouth as needed.    Marland Kitchen CANNABIDIOL PO Take 30 mg by mouth daily.    . Coenzyme Q10 (COQ-10 PO) Take 1 capsule by mouth daily. Takes CoQ 10 w/cinnamon    . dexamethasone (DECADRON) 4 MG tablet Take 2 tablets (8 mg total) by mouth daily.  Start the day after bendamustine chemotherapy for 2 days. Take with food. 30 tablet 1  . glipiZIDE (GLUCOTROL XL) 10 MG 24 hr tablet Take 20 mg by mouth every morning.    . Glucosamine-Chondroitin (GLUCOSAMINE CHONDR COMPLEX PO) Take 1 tablet by mouth daily.    Marland Kitchen LORazepam (ATIVAN) 0.5 MG tablet Take 1 tablet (0.5 mg total) by mouth every 6 (six) hours as needed (Nausea or vomiting). 30 tablet 0  . metFORMIN (GLUCOPHAGE-XR) 500 MG 24 hr tablet Take 500 mg by mouth daily.    . Multiple Vitamin (MULTIVITAMIN) tablet Take 1 tablet by mouth daily.    . ondansetron (ZOFRAN) 8 MG tablet Take 1 tablet (8 mg total) by mouth 2 (two) times daily as needed for refractory nausea / vomiting. Start on day 2 after bendamustine chemotherapy. 30 tablet 1  . prochlorperazine (COMPAZINE) 10 MG tablet Take 1 tablet (10 mg total) by mouth every 6 (six) hours as needed (Nausea or vomiting). 30 tablet 1  . ramipril (ALTACE) 10 MG capsule Take 10 mg by mouth daily.    Marland Kitchen triamterene-hydrochlorothiazide (MAXZIDE-25) 37.5-25 MG per tablet Take 1 tablet by mouth daily.     No current facility-administered medications for this visit.    REVIEW OF SYSTEMS:   A 10+ POINT REVIEW OF SYSTEMS WAS OBTAINED including neurology, dermatology, psychiatry, cardiac, respiratory, lymph, extremities, GI, GU, Musculoskeletal, constitutional, breasts, reproductive, HEENT.  All pertinent positives are noted in the HPI.  All others are negative.   PHYSICAL EXAMINATION: ECOG  PERFORMANCE STATUS: 0 - Asymptomatic  . Vitals:   11/10/19 0904  BP: 131/74  Pulse: 100  Resp: 18  Temp: 98.2 F (36.8 C)  SpO2: 91%   Filed Weights   11/10/19 0904  Weight: 195 lb 4.8 oz (88.6 kg)   .Body mass index is 33.52 kg/m.   GENERAL:alert, in no acute distress and comfortable SKIN: no acute rashes, no significant lesions EYES: conjunctiva are pink and non-injected, sclera anicteric OROPHARYNX: MMM, no exudates, no oropharyngeal erythema or ulceration NECK: supple, no JVD LYMPH:  no palpable lymphadenopathy in the cervical, axillary or inguinal regions LUNGS: clear to auscultation b/l with normal respiratory effort HEART: regular rate & rhythm ABDOMEN:  normoactive bowel sounds , non tender, not distended. Extremity: no pedal edema PSYCH: alert & oriented x 3 with fluent speech NEURO: no focal motor/sensory deficits  LABORATORY DATA:  I have reviewed the data as listed  . CBC Latest Ref Rng & Units 11/10/2019 10/19/2019 09/21/2019  WBC 4.0 - 10.5 K/uL 5.2 4.0 4.2  Hemoglobin 13.0 - 17.0 g/dL 8.9(L) 8.8(L) 9.9(L)  Hematocrit 39.0 - 52.0 % 27.5(L) 26.5(L) 28.8(L)  Platelets 150 - 400 K/uL 172 53(L) 99(L)    . CMP Latest Ref Rng & Units 11/10/2019 10/19/2019 09/21/2019  Glucose 70 - 99 mg/dL 208(H) 136(H) 265(H)  BUN 8 - 23 mg/dL _0 Creatinine 0.61 - 1.24 mg/dL 0.95 0.87 1.25(H)  Sodium 135 - 145 mmol/L 136 145 141  Potassium 3.5 - 5.1 mmol/L 3.8 3.3(L) 4.1  Chloride 98 - 111 mmol/L 103 109 108  CO2 22 - 32 mmol/L 21(L) 24 19(L)  Calcium 8.9 - 10.3 mg/dL 8.7(L) 9.1 9.1  Total Protein 6.5 - 8.1 g/dL 5.9(L) 6.2(L) 6.5  Total Bilirubin 0.3 - 1.2 mg/dL 0.8 0.6 0.5  Alkaline Phos 38 - 126 U/L 71 74 82  AST 15 - 41 U/L _1 ALT 0 - 44 U/L _2 06/10/2019  Flow Pathology Report ([id number]):   06/10/2019 Surgical Pathology ([id number]):   Surgical Pathology  CASE: [id number]  PATIENT: Dean Dixon  Flow Pathology Report       Clinical history: Lymphocytosis      DIAGNOSIS:   -Abnormal B-cell population identified.  -See comment   COMMENT:   Analysis of the lymphoid population shows a major abnormal B-cell  population representing 74% of all lymphocytes with expression of  HLA-Dr, CD20 and CD5. This is associated with extremely dim/negative  staining for surface immunoglobulin light chains hindering assessment  and or quantitation of clonality. No CD10, CD38, or CD34 expression is  identified. The findings are most consistent with a B-cell  lymphoproliferative process. Examination of peripheral blood shows  predominance of medium and large atypical lymphoid cells characterized  by coarse chromatin, moderately abundant agranular blue cytoplasm and  occasional cells with small nucleoli. Consideration should be given to  chronic lymphocytic leukemia variant, mantle cell lymphoma, as well as  other CD5 positive B-cell lymphoproliferative processes. FISH studies  may be helpful in this regard.   GATING AND PHENOTYPIC ANALYSIS:   Gated population: Flow cytometric immunophenotyping is performed using  antibodies to the antigens listed in the table below. Electronic gates  are placed around a cell cluster displaying light scatter properties  corresponding to: lymphocytes   Abnormal Cells in gated population: 74%   Phenotype of Abnormal Cells: CD5, CD20, HLA-Dr    04/26/2019 Flow Cytometry [id number]):    05/02/2019 FISH:    WBC Neutros Lymphs  02/05/2017 9.9K 6.0K 2.6K  10/06/2017 12.6K 7.7K 3.2K  11/02/2018 12.3K 5.2K 5.6K    RADIOGRAPHIC STUDIES: I have personally reviewed the radiological images as listed and agreed with the findings in the report. DG Chest 2 View  Result Date: 11/10/2019 CLINICAL DATA:  Mantle cell lymphoma.  Fever and shortness of breath EXAM: CHEST - 2 VIEW COMPARISON:  Oct 23, 2014 chest radiograph and chest CT FINDINGS: The lungs are clear. The heart  size and pulmonary vascularity are normal. No adenopathy. There is degenerative change in the thoracic spine. IMPRESSION: Lungs clear.  Cardiac silhouette within normal limits. Electronically Signed   By: Lowella Grip III M.D.   On: 11/10/2019 10:03    ASSESSMENT & PLAN:   70 yo male with   1) Progressively increasing Lymphocytosis concerning for a lymphoproliferative. CD5+ve lymphoproliferative disorder likely CLL less likely Mantle cell lymphoma CLL FISH shows 17p mutation -04/26/2019 Flow Cytometry [id number]) revealed "Abnormal B-cell population identified. CD5+ clonal lymphoproliferative disorder" -04/26/2019 FISH revealed "Positive for p53 (17p13) deletion." -05/31/2019 PET/CT ([id number]) revealed "1. There are multiple prominent lymph nodes in the neck, both axilla, the retroperitoneum and pelvis with low level hypermetabolic activity consistent with chronic lymphocytic leukemia. [place] 3 and 4. 2. Mild splenomegaly and mild generalized splenic hypermetabolic activity 3. No evidence of bone marrow involvement." -06/10/2019 Flow Pathology Report ([id number]) revealed "Abnormal B-cell population." -06/10/2019 Surgical Pathology Report ([id number])  revealed "Atypical lymphoid proliferation consistent with mantle cell lymphoma." -09/19/19 PET Skull Base to Thigh ([id number])-- favorable response to current treatment  2) Rigors from Rituxan - needing demerol as premedication. Rituxan rate to be capped at 151m/hour  3) Thrombocytopenia from chemotherapy  PLAN: -Discussed pt labwork today, 11/10/19; of CBC w/diff and CMP is as follows: all values are WNL except for RBC at 3.10, Hemoglobin at 8.9, HCT at 27.5, RDW at 17.3, nRBC at 0.4, Lymphs Abs at 0.6K, Abs Immature Granulocytes at 0.37K, CO2 at 21, Glucose at 208, Calcium at  8.7, Total Protein at 5.9, Albumin at 3.1 -The pt has no prohibitive toxicities from continuing Rituxan tomorrow.  -Advised pt that we will repeat  PET/CT  -Discussed that after induction therapy we will continue maintenance Rituxan until progression or intolerance for up to three years -Continue to take Glipizide and Metformin as prescribed  -Advised on fever/infection while on Rituximab -Advised on skipping cycle 6 of treatment or possibly rescheduling C6 -If in remission, may start maintenance Rituxan  -Will recheck O2 today -Will get CXR of chest -may need to get CT scan but pt needs to be premedicated   FOLLOW UP: CXR stat now Patient with let us know when this is done.  The total time spent in the appt was 30 minutes and more than 50% was on counseling and direct patient cares.  All of the patient's questions were answered with apparent satisfaction. The patient knows to call the clinic with any problems, questions or concerns.  Sullivan Lone MD MS AAHIVMS [facility] Hematology/Oncology Physician [facility]  (Office):       [phone] (Work cell):  [phone] (Fax):           [phone]  11/10/2019 1:20 PM  I, Dawayne Cirri am acting as a scribe for Dr. Sullivan Lone.   .I have reviewed the above documentation for accuracy and completeness, and I agree with the above.  Brunetta Genera MD   ADDENDUM  CXR:  11/10/2019: IMPRESSION: Lungs clear.  Cardiac silhouette within normal limits. Electronically Signed   By: Lowella Grip III M.D.   On: 11/10/2019 10:03  Component     Latest Ref Rng & Units 11/10/2019  WBC     4.0 - 10.5 K/uL   RBC     4.22 - 5.81 MIL/uL   Hemoglobin     13.0 - 17.0 g/dL   HCT     39.0 - 52.0 %   MCV     80.0 - 100.0 fL   MCH     26.0 - 34.0 pg   MCHC     30.0 - 36.0 g/dL   RDW     11.5 - 15.5 %   Platelets     150 - 400 K/uL   nRBC     0.0 - 0.2 %   Neutrophils     %   NEUT#     1.7 - 7.7 K/uL   Lymphocytes     %   Lymphocyte #     0.7 - 4.0 K/uL   Monocytes Relative     %   Monocyte #     0.1 - 1.0 K/uL   Eosinophil     %   Eosinophils  Absolute     0.0 - 0.5 K/uL   Basophil     %   Basophils Absolute     0.0 - 0.1 K/uL   Immature Granulocytes     %   Abs Immature Granulocytes     0.00 - 0.07 K/uL   Sodium     135 - 145 mmol/L   Potassium     3.5 - 5.1 mmol/L   Chloride     98 - 111 mmol/L   CO2     22 - 32 mmol/L   Glucose     70 - 99 mg/dL   BUN     8 - 23 mg/dL   Creatinine     0.61 - 1.24 mg/dL   Calcium  8.9 - 10.3 mg/dL   GFR, Est Non African American     >60 mL/min   GFR, Est African American     >60 mL/min   Anion gap     5 - 15   Procalcitonin     ng/mL <0.10  B Natriuretic Peptide     0.0 - 100.0 pg/mL 41.9  D-Dimer, Quant     0.00 - 0.50 ug/mL-FEU 0.84 (H)   PLAN -no overt pneumonia. procalcitonin neg. -BNP- unrevealing. -d dimer - borderline. COuld be from obesity/chemotherapy. -fevers resolved. Breathing improving -- monitoring and seek immediate attention in ED if worsening symptoms.

## 2019-11-10 ENCOUNTER — Other Ambulatory Visit: Payer: Medicare Other

## 2019-11-10 ENCOUNTER — Inpatient Hospital Stay: Payer: Medicare Other

## 2019-11-10 ENCOUNTER — Inpatient Hospital Stay: Payer: Medicare Other | Attending: Hematology | Admitting: Hematology

## 2019-11-10 ENCOUNTER — Other Ambulatory Visit: Payer: Self-pay

## 2019-11-10 ENCOUNTER — Ambulatory Visit (HOSPITAL_COMMUNITY)
Admission: RE | Admit: 2019-11-10 | Discharge: 2019-11-10 | Disposition: A | Discharge status: Home / Self Care | Payer: Medicare Other | Source: Ambulatory Visit | Attending: Hematology | Admitting: Hematology

## 2019-11-10 ENCOUNTER — Ambulatory Visit: Payer: Medicare Other

## 2019-11-10 VITALS — BP 131/74 | HR 100 | Temp 98.2°F | Resp 18 | Ht 64.0 in | Wt 195.3 lb

## 2019-11-10 DIAGNOSIS — R0602 Shortness of breath: Secondary | ICD-10-CM

## 2019-11-10 DIAGNOSIS — C8318 Mantle cell lymphoma, lymph nodes of multiple sites: Secondary | ICD-10-CM

## 2019-11-10 DIAGNOSIS — R509 Fever, unspecified: Secondary | ICD-10-CM | POA: Diagnosis not present

## 2019-11-10 LAB — D-DIMER, QUANTITATIVE: D-Dimer, Quant: 0.84 ug/mL-FEU — ABNORMAL HIGH (ref 0.00–0.50)

## 2019-11-10 LAB — CMP (CANCER CENTER ONLY)
ALT: 16 U/L (ref 0–44)
AST: 18 U/L (ref 15–41)
Albumin: 3.1 g/dL — ABNORMAL LOW (ref 3.5–5.0)
Alkaline Phosphatase: 71 U/L (ref 38–126)
Anion gap: 12 (ref 5–15)
BUN: 15 mg/dL (ref 8–23)
CO2: 21 mmol/L — ABNORMAL LOW (ref 22–32)
Calcium: 8.7 mg/dL — ABNORMAL LOW (ref 8.9–10.3)
Chloride: 103 mmol/L (ref 98–111)
Creatinine: 0.95 mg/dL (ref 0.61–1.24)
GFR, Est AFR Am: >60 mL/min (ref >60)
GFR, Estimated: >60 mL/min (ref >60)
Glucose, Bld: 208 mg/dL — ABNORMAL HIGH (ref 70–99)
Potassium: 3.8 mmol/L (ref 3.5–5.1)
Sodium: 136 mmol/L (ref 135–145)
Total Bilirubin: 0.8 mg/dL (ref 0.3–1.2)
Total Protein: 5.9 g/dL — ABNORMAL LOW (ref 6.5–8.1)

## 2019-11-10 LAB — BRAIN NATRIURETIC PEPTIDE: B Natriuretic Peptide: 41.9 pg/mL (ref 0.0–100.0)

## 2019-11-10 LAB — CBC WITH DIFFERENTIAL (CANCER CENTER ONLY)
Abs Immature Granulocytes: 0.37 10*3/uL — ABNORMAL HIGH (ref 0.00–0.07)
Basophils Absolute: 0 10*3/uL (ref 0.0–0.1)
Basophils Relative: 1 %
Eosinophils Absolute: 0.1 10*3/uL (ref 0.0–0.5)
Eosinophils Relative: 1 %
HCT: 27.5 % — ABNORMAL LOW (ref 39.0–52.0)
Hemoglobin: 8.9 g/dL — ABNORMAL LOW (ref 13.0–17.0)
Immature Granulocytes: 7 %
Lymphocytes Relative: 11 %
Lymphs Abs: 0.6 10*3/uL — ABNORMAL LOW (ref 0.7–4.0)
MCH: 28.7 pg (ref 26.0–34.0)
MCHC: 32.4 g/dL (ref 30.0–36.0)
MCV: 88.7 fL (ref 80.0–100.0)
Monocytes Absolute: 0.4 10*3/uL (ref 0.1–1.0)
Monocytes Relative: 7 %
Neutro Abs: 3.7 10*3/uL (ref 1.7–7.7)
Neutrophils Relative %: 73 %
Platelet Count: 172 10*3/uL (ref 150–400)
RBC: 3.1 MIL/uL — ABNORMAL LOW (ref 4.22–5.81)
RDW: 17.3 % — ABNORMAL HIGH (ref 11.5–15.5)
WBC Count: 5.2 10*3/uL (ref 4.0–10.5)
nRBC: 0.4 % — ABNORMAL HIGH (ref 0.0–0.2)

## 2019-11-10 LAB — PROCALCITONIN: Procalcitonin: <0.1 ng/mL

## 2019-11-11 ENCOUNTER — Ambulatory Visit: Payer: Medicare Other

## 2019-11-11 ENCOUNTER — Inpatient Hospital Stay: Payer: Medicare Other

## 2019-11-13 ENCOUNTER — Encounter (HOSPITAL_COMMUNITY): Payer: Self-pay | Admitting: *Deleted

## 2019-11-13 ENCOUNTER — Inpatient Hospital Stay (HOSPITAL_COMMUNITY)
Admission: EM | Admit: 2019-11-13 | Discharge: 2019-11-17 | DRG: 189 | Disposition: A | Discharge status: Home / Self Care | Payer: Medicare Other | Attending: Family Medicine | Admitting: Family Medicine

## 2019-11-13 ENCOUNTER — Other Ambulatory Visit: Payer: Self-pay

## 2019-11-13 ENCOUNTER — Emergency Department (HOSPITAL_COMMUNITY): Payer: Medicare Other

## 2019-11-13 DIAGNOSIS — Z20822 Contact with and (suspected) exposure to covid-19: Secondary | ICD-10-CM | POA: Diagnosis not present

## 2019-11-13 DIAGNOSIS — E669 Obesity, unspecified: Secondary | ICD-10-CM | POA: Diagnosis present

## 2019-11-13 DIAGNOSIS — I10 Essential (primary) hypertension: Secondary | ICD-10-CM | POA: Diagnosis present

## 2019-11-13 DIAGNOSIS — J849 Interstitial pulmonary disease, unspecified: Secondary | ICD-10-CM | POA: Diagnosis not present

## 2019-11-13 DIAGNOSIS — J9621 Acute and chronic respiratory failure with hypoxia: Secondary | ICD-10-CM | POA: Diagnosis present

## 2019-11-13 DIAGNOSIS — J189 Pneumonia, unspecified organism: Secondary | ICD-10-CM | POA: Diagnosis present

## 2019-11-13 DIAGNOSIS — C8310 Mantle cell lymphoma, unspecified site: Secondary | ICD-10-CM | POA: Diagnosis not present

## 2019-11-13 DIAGNOSIS — E119 Type 2 diabetes mellitus without complications: Secondary | ICD-10-CM

## 2019-11-13 DIAGNOSIS — K219 Gastro-esophageal reflux disease without esophagitis: Secondary | ICD-10-CM | POA: Diagnosis present

## 2019-11-13 DIAGNOSIS — R000 Tachycardia, unspecified: Secondary | ICD-10-CM | POA: Diagnosis not present

## 2019-11-13 DIAGNOSIS — R0602 Shortness of breath: Secondary | ICD-10-CM

## 2019-11-13 DIAGNOSIS — Z6834 Body mass index (BMI) 34.0-34.9, adult: Secondary | ICD-10-CM

## 2019-11-13 DIAGNOSIS — Z79899 Other long term (current) drug therapy: Secondary | ICD-10-CM

## 2019-11-13 DIAGNOSIS — J9601 Acute respiratory failure with hypoxia: Principal | ICD-10-CM | POA: Diagnosis present

## 2019-11-13 DIAGNOSIS — Z7984 Long term (current) use of oral hypoglycemic drugs: Secondary | ICD-10-CM

## 2019-11-13 DIAGNOSIS — E874 Mixed disorder of acid-base balance: Secondary | ICD-10-CM | POA: Diagnosis not present

## 2019-11-13 DIAGNOSIS — C8318 Mantle cell lymphoma, lymph nodes of multiple sites: Secondary | ICD-10-CM | POA: Diagnosis present

## 2019-11-13 DIAGNOSIS — D696 Thrombocytopenia, unspecified: Secondary | ICD-10-CM | POA: Diagnosis present

## 2019-11-13 DIAGNOSIS — D61810 Antineoplastic chemotherapy induced pancytopenia: Secondary | ICD-10-CM | POA: Diagnosis present

## 2019-11-13 DIAGNOSIS — E1165 Type 2 diabetes mellitus with hyperglycemia: Secondary | ICD-10-CM | POA: Diagnosis present

## 2019-11-13 DIAGNOSIS — D649 Anemia, unspecified: Secondary | ICD-10-CM

## 2019-11-13 DIAGNOSIS — R918 Other nonspecific abnormal finding of lung field: Secondary | ICD-10-CM

## 2019-11-13 HISTORY — DX: Malignant (primary) neoplasm, unspecified: C80.1

## 2019-11-13 LAB — I-STAT CHEM 8, ED
BUN: 12 mg/dL (ref 8–23)
Calcium, Ion: 1.13 mmol/L — ABNORMAL LOW (ref 1.15–1.40)
Chloride: 105 mmol/L (ref 98–111)
Creatinine, Ser: 0.8 mg/dL (ref 0.61–1.24)
Glucose, Bld: 190 mg/dL — ABNORMAL HIGH (ref 70–99)
HCT: 26 % — ABNORMAL LOW (ref 39.0–52.0)
Hemoglobin: 8.8 g/dL — ABNORMAL LOW (ref 13.0–17.0)
Potassium: 3.6 mmol/L (ref 3.5–5.1)
Sodium: 137 mmol/L (ref 135–145)
TCO2: 21 mmol/L — ABNORMAL LOW (ref 22–32)

## 2019-11-13 LAB — BLOOD GAS, VENOUS
Bicarbonate: 21.6 mmol/L (ref 20.0–28.0)
O2 Saturation: 86.3 %
Patient temperature: 98.6
pCO2, Ven: 31.3 mmHg — ABNORMAL LOW (ref 44.0–60.0)
pH, Ven: 7.453 — ABNORMAL HIGH (ref 7.250–7.430)
pO2, Ven: 52.1 mmHg — ABNORMAL HIGH (ref 32.0–45.0)

## 2019-11-13 LAB — CBC WITH DIFFERENTIAL/PLATELET
Abs Immature Granulocytes: 0.26 10*3/uL — ABNORMAL HIGH (ref 0.00–0.07)
Basophils Absolute: 0 10*3/uL (ref 0.0–0.1)
Basophils Relative: 1 %
Eosinophils Absolute: 0 10*3/uL (ref 0.0–0.5)
Eosinophils Relative: 1 %
HCT: 27.7 % — ABNORMAL LOW (ref 39.0–52.0)
Hemoglobin: 8.8 g/dL — ABNORMAL LOW (ref 13.0–17.0)
Immature Granulocytes: 5 %
Lymphocytes Relative: 9 %
Lymphs Abs: 0.5 10*3/uL — ABNORMAL LOW (ref 0.7–4.0)
MCH: 29.1 pg (ref 26.0–34.0)
MCHC: 31.8 g/dL (ref 30.0–36.0)
MCV: 91.7 fL (ref 80.0–100.0)
Monocytes Absolute: 0.4 10*3/uL (ref 0.1–1.0)
Monocytes Relative: 7 %
Neutro Abs: 4.3 10*3/uL (ref 1.7–7.7)
Neutrophils Relative %: 77 %
Platelets: 162 10*3/uL (ref 150–400)
RBC: 3.02 MIL/uL — ABNORMAL LOW (ref 4.22–5.81)
RDW: 18.2 % — ABNORMAL HIGH (ref 11.5–15.5)
WBC: 5.5 10*3/uL (ref 4.0–10.5)
nRBC: 0 % (ref 0.0–0.2)

## 2019-11-13 LAB — COMPREHENSIVE METABOLIC PANEL
ALT: 15 U/L (ref 0–44)
AST: 17 U/L (ref 15–41)
Albumin: 3.3 g/dL — ABNORMAL LOW (ref 3.5–5.0)
Alkaline Phosphatase: 59 U/L (ref 38–126)
Anion gap: 11 (ref 5–15)
BUN: 14 mg/dL (ref 8–23)
CO2: 20 mmol/L — ABNORMAL LOW (ref 22–32)
Calcium: 8.3 mg/dL — ABNORMAL LOW (ref 8.9–10.3)
Chloride: 106 mmol/L (ref 98–111)
Creatinine, Ser: 0.89 mg/dL (ref 0.61–1.24)
GFR calc Af Amer: >60 mL/min (ref >60)
GFR calc non Af Amer: >60 mL/min (ref >60)
Glucose, Bld: 197 mg/dL — ABNORMAL HIGH (ref 70–99)
Potassium: 3.6 mmol/L (ref 3.5–5.1)
Sodium: 137 mmol/L (ref 135–145)
Total Bilirubin: 0.8 mg/dL (ref 0.3–1.2)
Total Protein: 5.8 g/dL — ABNORMAL LOW (ref 6.5–8.1)

## 2019-11-13 LAB — BRAIN NATRIURETIC PEPTIDE: B Natriuretic Peptide: 47.6 pg/mL (ref 0.0–100.0)

## 2019-11-13 LAB — LACTIC ACID, PLASMA: Lactic Acid, Venous: 1.2 mmol/L (ref 0.5–1.9)

## 2019-11-13 LAB — TROPONIN I (HIGH SENSITIVITY): Troponin I (High Sensitivity): 9 ng/L (ref <18)

## 2019-11-13 LAB — SARS CORONAVIRUS 2 BY RT PCR (HOSPITAL ORDER, PERFORMED IN ~~LOC~~ HOSPITAL LAB): SARS Coronavirus 2: NEGATIVE

## 2019-11-13 MED ORDER — DIPHENHYDRAMINE HCL 50 MG/ML IJ SOLN
50.0000 mg | Freq: Once | INTRAMUSCULAR | Status: AC
  Administered 2019-11-14: 50 mg via INTRAVENOUS
  Filled 2019-11-13: qty 1

## 2019-11-13 MED ORDER — HYDROCORTISONE NA SUCCINATE PF 100 MG IJ SOLR
100.0000 mg | Freq: Once | INTRAMUSCULAR | Status: AC
  Administered 2019-11-13: 100 mg via INTRAVENOUS
  Filled 2019-11-13: qty 2

## 2019-11-13 NOTE — ED Triage Notes (Signed)
Pt arrives with c/o SOB for over a week, reporting he had xray/blood work done by oncologist at the end of last week, does not know results. Saturations 81% on RA, placed on 5L [place] in triage, sats 90%.

## 2019-11-13 NOTE — ED Provider Notes (Signed)
Plan to f/u on CT imaging for PE Pt will need pre-medication 0030 - benadryl One hour later - CT scan Likely admit    Ripley Fraise, MD 11/13/19 2348
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
## 2019-11-14 ENCOUNTER — Inpatient Hospital Stay (HOSPITAL_COMMUNITY): Payer: Medicare Other

## 2019-11-14 ENCOUNTER — Encounter (HOSPITAL_COMMUNITY): Payer: Self-pay

## 2019-11-14 ENCOUNTER — Emergency Department (HOSPITAL_COMMUNITY): Payer: Medicare Other

## 2019-11-14 DIAGNOSIS — C8318 Mantle cell lymphoma, lymph nodes of multiple sites: Secondary | ICD-10-CM | POA: Diagnosis not present

## 2019-11-14 DIAGNOSIS — D61810 Antineoplastic chemotherapy induced pancytopenia: Secondary | ICD-10-CM | POA: Diagnosis present

## 2019-11-14 DIAGNOSIS — R0602 Shortness of breath: Secondary | ICD-10-CM

## 2019-11-14 DIAGNOSIS — R918 Other nonspecific abnormal finding of lung field: Secondary | ICD-10-CM

## 2019-11-14 DIAGNOSIS — Z6834 Body mass index (BMI) 34.0-34.9, adult: Secondary | ICD-10-CM | POA: Diagnosis not present

## 2019-11-14 DIAGNOSIS — I159 Secondary hypertension, unspecified: Secondary | ICD-10-CM

## 2019-11-14 DIAGNOSIS — E119 Type 2 diabetes mellitus without complications: Secondary | ICD-10-CM | POA: Diagnosis not present

## 2019-11-14 DIAGNOSIS — E669 Obesity, unspecified: Secondary | ICD-10-CM | POA: Diagnosis present

## 2019-11-14 DIAGNOSIS — D696 Thrombocytopenia, unspecified: Secondary | ICD-10-CM | POA: Diagnosis present

## 2019-11-14 DIAGNOSIS — Z79899 Other long term (current) drug therapy: Secondary | ICD-10-CM | POA: Diagnosis not present

## 2019-11-14 DIAGNOSIS — K219 Gastro-esophageal reflux disease without esophagitis: Secondary | ICD-10-CM | POA: Diagnosis present

## 2019-11-14 DIAGNOSIS — C8310 Mantle cell lymphoma, unspecified site: Secondary | ICD-10-CM | POA: Diagnosis present

## 2019-11-14 DIAGNOSIS — J9601 Acute respiratory failure with hypoxia: Principal | ICD-10-CM

## 2019-11-14 DIAGNOSIS — J9621 Acute and chronic respiratory failure with hypoxia: Secondary | ICD-10-CM | POA: Diagnosis present

## 2019-11-14 DIAGNOSIS — J849 Interstitial pulmonary disease, unspecified: Secondary | ICD-10-CM | POA: Diagnosis present

## 2019-11-14 DIAGNOSIS — E1165 Type 2 diabetes mellitus with hyperglycemia: Secondary | ICD-10-CM | POA: Diagnosis present

## 2019-11-14 DIAGNOSIS — E874 Mixed disorder of acid-base balance: Secondary | ICD-10-CM | POA: Diagnosis present

## 2019-11-14 DIAGNOSIS — I10 Essential (primary) hypertension: Secondary | ICD-10-CM | POA: Diagnosis present

## 2019-11-14 DIAGNOSIS — Z7984 Long term (current) use of oral hypoglycemic drugs: Secondary | ICD-10-CM | POA: Diagnosis not present

## 2019-11-14 DIAGNOSIS — J189 Pneumonia, unspecified organism: Secondary | ICD-10-CM | POA: Diagnosis not present

## 2019-11-14 DIAGNOSIS — Z20822 Contact with and (suspected) exposure to covid-19: Secondary | ICD-10-CM | POA: Diagnosis present

## 2019-11-14 DIAGNOSIS — J9690 Respiratory failure, unspecified, unspecified whether with hypoxia or hypercapnia: Secondary | ICD-10-CM | POA: Diagnosis not present

## 2019-11-14 LAB — ECHOCARDIOGRAM COMPLETE
Height: 64 in
Weight: 3120 oz

## 2019-11-14 LAB — CBG MONITORING, ED: Glucose-Capillary: 200 mg/dL — ABNORMAL HIGH (ref 70–99)

## 2019-11-14 LAB — HEMOGLOBIN A1C
Hgb A1c MFr Bld: 5.4 % (ref 4.8–5.6)
Mean Plasma Glucose: 108.28 mg/dL

## 2019-11-14 LAB — CBC WITH DIFFERENTIAL/PLATELET
Abs Immature Granulocytes: 0.18 10*3/uL — ABNORMAL HIGH (ref 0.00–0.07)
Basophils Absolute: 0 10*3/uL (ref 0.0–0.1)
Basophils Relative: 1 %
Eosinophils Absolute: 0 10*3/uL (ref 0.0–0.5)
Eosinophils Relative: 0 %
HCT: 24.6 % — ABNORMAL LOW (ref 39.0–52.0)
Hemoglobin: 7.7 g/dL — ABNORMAL LOW (ref 13.0–17.0)
Immature Granulocytes: 5 %
Lymphocytes Relative: 11 %
Lymphs Abs: 0.4 10*3/uL — ABNORMAL LOW (ref 0.7–4.0)
MCH: 28.9 pg (ref 26.0–34.0)
MCHC: 31.3 g/dL (ref 30.0–36.0)
MCV: 92.5 fL (ref 80.0–100.0)
Monocytes Absolute: 0.2 10*3/uL (ref 0.1–1.0)
Monocytes Relative: 5 %
Neutro Abs: 3 10*3/uL (ref 1.7–7.7)
Neutrophils Relative %: 78 %
Platelets: 133 10*3/uL — ABNORMAL LOW (ref 150–400)
RBC: 2.66 MIL/uL — ABNORMAL LOW (ref 4.22–5.81)
RDW: 18.1 % — ABNORMAL HIGH (ref 11.5–15.5)
WBC: 3.8 10*3/uL — ABNORMAL LOW (ref 4.0–10.5)
nRBC: 0 % (ref 0.0–0.2)

## 2019-11-14 LAB — BASIC METABOLIC PANEL
Anion gap: 10 (ref 5–15)
BUN: 14 mg/dL (ref 8–23)
CO2: 20 mmol/L — ABNORMAL LOW (ref 22–32)
Calcium: 7.8 mg/dL — ABNORMAL LOW (ref 8.9–10.3)
Chloride: 106 mmol/L (ref 98–111)
Creatinine, Ser: 0.78 mg/dL (ref 0.61–1.24)
GFR calc Af Amer: >60 mL/min (ref >60)
GFR calc non Af Amer: >60 mL/min (ref >60)
Glucose, Bld: 244 mg/dL — ABNORMAL HIGH (ref 70–99)
Potassium: 4.1 mmol/L (ref 3.5–5.1)
Sodium: 136 mmol/L (ref 135–145)

## 2019-11-14 LAB — C-REACTIVE PROTEIN: CRP: 3.2 mg/dL — ABNORMAL HIGH (ref <1.0)

## 2019-11-14 LAB — STREP PNEUMONIAE URINARY ANTIGEN: Strep Pneumo Urinary Antigen: NEGATIVE

## 2019-11-14 LAB — GLUCOSE, CAPILLARY
Glucose-Capillary: 136 mg/dL — ABNORMAL HIGH (ref 70–99)
Glucose-Capillary: 138 mg/dL — ABNORMAL HIGH (ref 70–99)
Glucose-Capillary: 239 mg/dL — ABNORMAL HIGH (ref 70–99)

## 2019-11-14 LAB — TROPONIN I (HIGH SENSITIVITY): Troponin I (High Sensitivity): 8 ng/L (ref <18)

## 2019-11-14 LAB — PREPARE RBC (CROSSMATCH)

## 2019-11-14 LAB — LACTATE DEHYDROGENASE: LDH: 224 U/L — ABNORMAL HIGH (ref 98–192)

## 2019-11-14 LAB — LACTIC ACID, PLASMA: Lactic Acid, Venous: 1 mmol/L (ref 0.5–1.9)

## 2019-11-14 LAB — MRSA PCR SCREENING: MRSA by PCR: NEGATIVE

## 2019-11-14 LAB — SEDIMENTATION RATE: Sed Rate: 52 mm/hr — ABNORMAL HIGH (ref 0–16)

## 2019-11-14 LAB — HIV ANTIBODY (ROUTINE TESTING W REFLEX): HIV Screen 4th Generation wRfx: NONREACTIVE

## 2019-11-14 MED ORDER — ONDANSETRON HCL 4 MG PO TABS: 4.0000 mg | ORAL_TABLET | Freq: Four times a day (QID) | ORAL | Status: DC | PRN

## 2019-11-14 MED ORDER — SODIUM CHLORIDE 0.9% FLUSH
3.0000 mL | Freq: Two times a day (BID) | INTRAVENOUS | Status: DC
  Administered 2019-11-14 – 2019-11-16 (×7): 3 mL via INTRAVENOUS

## 2019-11-14 MED ORDER — ACETAMINOPHEN 650 MG RE SUPP: 650.0000 mg | Freq: Four times a day (QID) | RECTAL | Status: DC | PRN

## 2019-11-14 MED ORDER — SODIUM CHLORIDE (PF) 0.9 % IJ SOLN
INTRAMUSCULAR | Status: AC
  Filled 2019-11-14: qty 50

## 2019-11-14 MED ORDER — SODIUM CHLORIDE 0.9% FLUSH: 10.0000 mL | INTRAVENOUS | Status: DC | PRN

## 2019-11-14 MED ORDER — ENSURE ENLIVE PO LIQD
237.0000 mL | Freq: Two times a day (BID) | ORAL | Status: DC
  Administered 2019-11-14 – 2019-11-16 (×3): 237 mL via ORAL

## 2019-11-14 MED ORDER — ACETAMINOPHEN 325 MG PO TABS: 650.0000 mg | ORAL_TABLET | Freq: Four times a day (QID) | ORAL | Status: DC | PRN

## 2019-11-14 MED ORDER — SODIUM CHLORIDE 0.9 % IV SOLN
500.0000 mg | Freq: Once | INTRAVENOUS | Status: AC
  Administered 2019-11-14: 500 mg via INTRAVENOUS
  Filled 2019-11-14: qty 500

## 2019-11-14 MED ORDER — SENNOSIDES-DOCUSATE SODIUM 8.6-50 MG PO TABS: 1.0000 | ORAL_TABLET | Freq: Every evening | ORAL | Status: DC | PRN

## 2019-11-14 MED ORDER — HEPARIN SOD (PORK) LOCK FLUSH 100 UNIT/ML IV SOLN
250.0000 [IU] | INTRAVENOUS | Status: DC | PRN
  Filled 2019-11-14: qty 2.5

## 2019-11-14 MED ORDER — SODIUM CHLORIDE 0.9% FLUSH: 3.0000 mL | INTRAVENOUS | Status: DC | PRN

## 2019-11-14 MED ORDER — ALLOPURINOL 100 MG PO TABS
100.0000 mg | ORAL_TABLET | Freq: Two times a day (BID) | ORAL | Status: DC
  Administered 2019-11-14: 100 mg via ORAL
  Filled 2019-11-14 (×2): qty 1

## 2019-11-14 MED ORDER — ACETAMINOPHEN 325 MG PO TABS
650.0000 mg | ORAL_TABLET | Freq: Once | ORAL | Status: AC
  Administered 2019-11-14: 650 mg via ORAL
  Filled 2019-11-14: qty 2

## 2019-11-14 MED ORDER — ENOXAPARIN SODIUM 40 MG/0.4ML ~~LOC~~ SOLN
40.0000 mg | SUBCUTANEOUS | Status: DC
  Administered 2019-11-14 – 2019-11-17 (×4): 40 mg via SUBCUTANEOUS
  Filled 2019-11-14 (×4): qty 0.4

## 2019-11-14 MED ORDER — HYDROCODONE-ACETAMINOPHEN 5-325 MG PO TABS: 1.0000 | ORAL_TABLET | ORAL | Status: DC | PRN

## 2019-11-14 MED ORDER — RAMIPRIL 10 MG PO CAPS
10.0000 mg | ORAL_CAPSULE | Freq: Every day | ORAL | Status: DC
  Administered 2019-11-14 – 2019-11-17 (×4): 10 mg via ORAL
  Filled 2019-11-14 (×5): qty 1

## 2019-11-14 MED ORDER — METHYLPREDNISOLONE SODIUM SUCC 125 MG IJ SOLR
125.0000 mg | Freq: Four times a day (QID) | INTRAMUSCULAR | Status: DC
  Administered 2019-11-14 – 2019-11-15 (×6): 125 mg via INTRAVENOUS
  Filled 2019-11-14 (×5): qty 2

## 2019-11-14 MED ORDER — IOHEXOL 350 MG/ML SOLN
100.0000 mL | Freq: Once | INTRAVENOUS | Status: AC | PRN
  Administered 2019-11-14: 100 mL via INTRAVENOUS

## 2019-11-14 MED ORDER — VANCOMYCIN HCL IN DEXTROSE 1-5 GM/200ML-% IV SOLN: 1000.0000 mg | Freq: Once | INTRAVENOUS | Status: DC

## 2019-11-14 MED ORDER — SODIUM CHLORIDE 0.9 % IV SOLN
2.0000 g | Freq: Three times a day (TID) | INTRAVENOUS | Status: DC
  Administered 2019-11-14 – 2019-11-17 (×10): 2 g via INTRAVENOUS
  Filled 2019-11-14 (×13): qty 2

## 2019-11-14 MED ORDER — VANCOMYCIN HCL 1500 MG/300ML IV SOLN
1500.0000 mg | Freq: Once | INTRAVENOUS | Status: AC
  Administered 2019-11-14: 1500 mg via INTRAVENOUS
  Filled 2019-11-14: qty 300

## 2019-11-14 MED ORDER — INSULIN ASPART 100 UNIT/ML ~~LOC~~ SOLN
0.0000 [IU] | Freq: Every day | SUBCUTANEOUS | Status: DC
  Administered 2019-11-14: 2 [IU] via SUBCUTANEOUS
  Administered 2019-11-15: 5 [IU] via SUBCUTANEOUS
  Administered 2019-11-16: 4 [IU] via SUBCUTANEOUS
  Filled 2019-11-14: qty 0.05

## 2019-11-14 MED ORDER — ONDANSETRON HCL 4 MG/2ML IJ SOLN: 4.0000 mg | Freq: Four times a day (QID) | INTRAMUSCULAR | Status: DC | PRN

## 2019-11-14 MED ORDER — SODIUM CHLORIDE 0.9 % IV SOLN
2.0000 g | Freq: Once | INTRAVENOUS | Status: AC
  Administered 2019-11-14: 2 g via INTRAVENOUS
  Filled 2019-11-14: qty 2

## 2019-11-14 MED ORDER — SODIUM CHLORIDE 0.9 % IV SOLN
500.0000 mg | INTRAVENOUS | Status: DC
  Administered 2019-11-15: 500 mg via INTRAVENOUS
  Filled 2019-11-14: qty 500

## 2019-11-14 MED ORDER — METHYLPREDNISOLONE SODIUM SUCC 125 MG IJ SOLR: 125.0000 mg | Freq: Two times a day (BID) | INTRAMUSCULAR | Status: DC

## 2019-11-14 MED ORDER — ADULT MULTIVITAMIN W/MINERALS CH
1.0000 | ORAL_TABLET | Freq: Every day | ORAL | Status: DC
  Administered 2019-11-14 – 2019-11-17 (×4): 1 via ORAL
  Filled 2019-11-14 (×4): qty 1

## 2019-11-14 MED ORDER — AMLODIPINE BESYLATE 10 MG PO TABS
10.0000 mg | ORAL_TABLET | Freq: Every day | ORAL | Status: DC
  Administered 2019-11-14 – 2019-11-17 (×4): 10 mg via ORAL
  Filled 2019-11-14 (×4): qty 1

## 2019-11-14 MED ORDER — INSULIN ASPART 100 UNIT/ML ~~LOC~~ SOLN
0.0000 [IU] | Freq: Three times a day (TID) | SUBCUTANEOUS | Status: DC
  Administered 2019-11-14: 2 [IU] via SUBCUTANEOUS
  Administered 2019-11-14 (×2): 1 [IU] via SUBCUTANEOUS
  Administered 2019-11-15: 9 [IU] via SUBCUTANEOUS
  Administered 2019-11-15: 7 [IU] via SUBCUTANEOUS
  Administered 2019-11-15: 9 [IU] via SUBCUTANEOUS
  Administered 2019-11-16: 5 [IU] via SUBCUTANEOUS
  Administered 2019-11-16: 9 [IU] via SUBCUTANEOUS
  Administered 2019-11-16: 7 [IU] via SUBCUTANEOUS
  Administered 2019-11-17: 3 [IU] via SUBCUTANEOUS
  Administered 2019-11-17: 5 [IU] via SUBCUTANEOUS
  Administered 2019-11-17: 2 [IU] via SUBCUTANEOUS
  Filled 2019-11-14: qty 0.09

## 2019-11-14 MED ORDER — VANCOMYCIN HCL 750 MG/150ML IV SOLN: 750.0000 mg | Freq: Two times a day (BID) | INTRAVENOUS | Status: DC

## 2019-11-14 MED ORDER — LOPERAMIDE HCL 2 MG PO CAPS
2.0000 mg | ORAL_CAPSULE | ORAL | Status: DC | PRN
  Administered 2019-11-14 – 2019-11-16 (×2): 2 mg via ORAL
  Filled 2019-11-14 (×2): qty 1

## 2019-11-14 MED ORDER — SODIUM CHLORIDE 0.9% IV SOLUTION: 250.0000 mL | Freq: Once | INTRAVENOUS | Status: AC

## 2019-11-14 MED ORDER — LORAZEPAM 0.5 MG PO TABS: 0.5000 mg | ORAL_TABLET | Freq: Four times a day (QID) | ORAL | Status: DC | PRN

## 2019-11-14 MED ORDER — SODIUM CHLORIDE 0.9 % IV SOLN: 250.0000 mL | INTRAVENOUS | Status: DC | PRN

## 2019-11-14 MED ORDER — METHYLPREDNISOLONE SODIUM SUCC 125 MG IJ SOLR: 125.0000 mg | Freq: Three times a day (TID) | INTRAMUSCULAR | Status: DC

## 2019-11-14 MED ORDER — HEPARIN SOD (PORK) LOCK FLUSH 100 UNIT/ML IV SOLN
500.0000 [IU] | Freq: Every day | INTRAVENOUS | Status: DC | PRN
  Filled 2019-11-14: qty 5

## 2019-11-14 NOTE — Progress Notes (Signed)
Patient arrives to room 1513 at this time via stretcher from ED.  Patient independent from stretcher to bed with steady gait noted.  Patient oriented to callbell with stated understanding and settled in bed with siderails up x3 and callbell in reach with bed in lowest position

## 2019-11-14 NOTE — Progress Notes (Signed)
[facility]   HEMATOLOGY/ONCOLOGY INPATIENT PROGRESS NOTE  Date of Service: 11/14/2019  Inpatient Attending: .Raiford Noble [place], [place]   SUBJECTIVE  Dean Dixon was seen in f/u on request of Dr Alfredia Ferguson. He is well known to me from oncology clinic. He was admitted with worsening shortness of breath with O2 sat of 80% on RA. He previously had fever and mild SOB about 1 week ago. In clinic on Friday - notes minimal SOB, neg procalcitonin, normal BNP, borderline d dimer. CXR on Friday with NAI. On admission CXR with b/l infiltrated. CTA chest neg for PE. B/l GGO's. Patient has remained afebrile. BLCX NGTD. On broad spectrum antibiotics.  Last Rituxan dose on 4/29.   OBJECTIVE:  Mild respiratory distress  PHYSICAL EXAMINATION: . Vitals:   11/14/19 0800 11/14/19 0859 11/14/19 1309 11/14/19 1650  BP: 132/71 130/67 (!) 158/72 (!) 145/70  Pulse: 74 73 87 94  Resp: (!) 25 20 19 18   Temp:  (!) 97.2 F (36.2 C) (!) 97.3 F (36.3 C) 98.3 F (36.8 C)  TempSrc:  Oral Oral Oral  SpO2: 96% 95% 94% 91%  Weight:      Height:       Filed Weights   11/13/19 2131  Weight: 195 lb (88.5 kg)   .Body mass index is 33.47 kg/m.  GENERAL:alert SKIN: skin color, texture, turgor are normal, no rashes or significant lesions EYES: normal, conjunctiva are pink and non-injected, sclera clear OROPHARYNX:no exudate, no erythema and lips, buccal mucosa, and tongue normal  NECK: supple, no JVD, thyroid normal size, non-tender, without nodularity LYMPH:  no palpable lymphadenopathy in the cervical, axillary or inguinal LUNGS: b/l rales no rhonci HEART: regular rate & rhythm,  no murmurs and no lower extremity edema ABDOMEN: abdomen soft, non-tender, normoactive bowel sounds  Musculoskeletal: no cyanosis of digits and no clubbing  PSYCH: alert & oriented x 3 with fluent speech NEURO: no focal motor/sensory deficits  MEDICAL HISTORY:  Past Medical History:  Diagnosis Date  . Cancer ([place])    Mantle Cell Lymphoma    . Diabetes mellitus without complication ([place])   . Hypertension     SURGICAL HISTORY: Past Surgical History:  Procedure Laterality Date  . APPENDECTOMY    . TONSILLECTOMY      SOCIAL HISTORY: Social History   Socioeconomic History  . Marital status: Divorced    Spouse name: Not on file  . Number of children: Not on file  . Years of education: Not on file  . Highest education level: Not on file  Occupational History  . Not on file  Tobacco Use  . Smoking status: Never Smoker  . Smokeless tobacco: Never Used  Substance and Sexual Activity  . Alcohol use: Yes    Alcohol/week: 2.0 standard drinks    Types: 2 Standard drinks or equivalent per week  . Drug use: No  . Sexual activity: Not on file  Other Topics Concern  . Not on file  Social History Narrative  . Not on file   Social Determinants of Health   Financial Resource Strain:   . Difficulty of Paying Living Expenses:   Food Insecurity:   . Worried About Charity fundraiser in the Last Year:   . Arboriculturist in the Last Year:   Transportation Needs:   . Film/video editor (Medical):   [facility] Lack of Transportation (Non-Medical):   Physical Activity:   . Days of Exercise per Week:   . Minutes of Exercise per Session:  Stress:   . Feeling of Stress :   Social Connections:   . Frequency of Communication with Friends and Family:   . Frequency of Social Gatherings with Friends and Family:   . Attends Religious Services:   . Active Member of Clubs or Organizations:   . Attends Archivist Meetings:   [facility] Kitchen Marital Status:   Intimate Partner Violence:   . Fear of Current or Ex-Partner:   . Emotionally Abused:   [facility] Physically Abused:   . Sexually Abused:     FAMILY HISTORY: History reviewed. No pertinent family history.  ALLERGIES:  is allergic to ivp dye [iodinated diagnostic agents].  MEDICATIONS:  Scheduled Meds: . amLODipine  10 mg Oral Daily  . enoxaparin (LOVENOX) injection  40 mg  Subcutaneous Q24H  . feeding supplement (ENSURE ENLIVE)  237 mL Oral BID BM  . insulin aspart  0-5 Units Subcutaneous QHS  . insulin aspart  0-9 Units Subcutaneous TID WC  . methylPREDNISolone (SOLU-MEDROL) injection  125 mg Intravenous Q6H  . multivitamin with minerals  1 tablet Oral Daily  . ramipril  10 mg Oral Daily  . sodium chloride flush  3 mL Intravenous Q12H   Continuous Infusions: . sodium chloride    . [START ON 11/15/2019] azithromycin    . ceFEPime (MAXIPIME) IV 2 g (11/14/19 1138)   PRN Meds:.sodium chloride, acetaminophen **OR** acetaminophen, HYDROcodone-acetaminophen, loperamide, LORazepam, ondansetron **OR** ondansetron (ZOFRAN) IV, senna-docusate, sodium chloride flush  REVIEW OF SYSTEMS:    10 Point review of Systems was done is negative except as noted above.   LABORATORY DATA:  I have reviewed the data as listed  . CBC Latest Ref Rng & Units 11/14/2019 11/13/2019 11/13/2019  WBC 4.0 - 10.5 K/uL 3.8(L) - 5.5  Hemoglobin 13.0 - 17.0 g/dL 7.7(L) 8.8(L) 8.8(L)  Hematocrit 39.0 - 52.0 % 24.6(L) 26.0(L) 27.7(L)  Platelets 150 - 400 K/uL 133(L) - 162    . CMP Latest Ref Rng & Units 11/14/2019 11/13/2019 11/13/2019  Glucose 70 - 99 mg/dL 244(H) 190(H) 197(H)  BUN 8 - 23 mg/dL 14 12 14   Creatinine 0.61 - 1.24 mg/dL 0.78 0.80 0.89  Sodium 135 - 145 mmol/L 136 137 137  Potassium 3.5 - 5.1 mmol/L 4.1 3.6 3.6  Chloride 98 - 111 mmol/L 106 105 106  CO2 22 - 32 mmol/L 20(L) - 20(L)  Calcium 8.9 - 10.3 mg/dL 7.8(L) - 8.3(L)  Total Protein 6.5 - 8.1 g/dL - - 5.8(L)  Total Bilirubin 0.3 - 1.2 mg/dL - - 0.8  Alkaline Phos 38 - 126 U/L - - 59  AST 15 - 41 U/L - - 17  ALT 0 - 44 U/L - - 15     RADIOGRAPHIC STUDIES: I have personally reviewed the radiological images as listed and agreed with the findings in the report. DG Chest 2 View  Result Date: 11/10/2019 CLINICAL DATA:  Mantle cell lymphoma.  Fever and shortness of breath EXAM: CHEST - 2 VIEW COMPARISON:  Oct 23, 2014 chest radiograph and chest CT FINDINGS: The lungs are clear. The heart size and pulmonary vascularity are normal. No adenopathy. There is degenerative change in the thoracic spine. IMPRESSION: Lungs clear.  Cardiac silhouette within normal limits. Electronically Signed   By: Lowella Grip III M.D.   On: 11/10/2019 10:03   CT Angio Chest PE W and/or Wo Contrast  Result Date: 11/14/2019 CLINICAL DATA:  Shortness of breath. EXAM: CT ANGIOGRAPHY CHEST WITH CONTRAST TECHNIQUE: Multidetector CT imaging of the  chest was performed using the standard protocol during bolus administration of intravenous contrast. Multiplanar CT image reconstructions and MIPs were obtained to evaluate the vascular anatomy. CONTRAST:  162mL OMNIPAQUE IOHEXOL 350 MG/ML SOLN COMPARISON:  PET-CT dated September 19, 2018 FINDINGS: Cardiovascular: Evaluation is limited by respiratory motion artifact. Given this limitation, there was no acute pulmonary embolism detected.The heart size is normal. There is no significant pericardial effusion. There is no evidence for thoracic aortic dissection or aneurysm. Mediastinum/Nodes: --No mediastinal or hilar lymphadenopathy. --No axillary lymphadenopathy. --No supraclavicular lymphadenopathy. --Normal thyroid gland. --The esophagus is unremarkable Lungs/Pleura: There are hazy bilateral ground-glass airspace opacities. There is some interlobular septal thickening. There are trace bilateral pleural effusions. There is atelectasis at the lung bases. There is no pneumothorax. The trachea is unremarkable. Upper Abdomen: Large cyst is again noted in the patient's left hepatic lobe. There is persistent splenomegaly. There is a heterogeneous appearance of the splenic parenchyma which is felt to be secondary to contrast timing in the absence of left upper quadrant abdominal pain. Musculoskeletal: No chest wall abnormality. No acute or significant osseous findings. Review of the MIP images confirms the above  findings. IMPRESSION: 1. Evaluation for pulmonary emboli is limited by respiratory motion artifact. Given this limitation, no acute pulmonary embolism was detected. 2. There are diffuse bilateral hazy ground-glass airspace opacities which may represent an atypical infectious process or pulmonary edema. 3. Splenomegaly. Electronically Signed   By: Constance Holster M.D.   On: 11/14/2019 01:36   DG Chest Port 1 View  Result Date: 11/13/2019 CLINICAL DATA:  Initial evaluation for acute shortness of breath, weakness. EXAM: PORTABLE CHEST 1 VIEW COMPARISON:  Prior radiograph from 11/10/2019. FINDINGS: Exaggeration of the cardiac silhouette related AP technique and low lung volumes. Mediastinal silhouette normal. Lungs hypoinflated. Patchy and hazy opacity seen within the mid and lower lungs bilaterally, which could reflect atelectasis or possibly infiltrates. Underlying mild pulmonary interstitial congestion. No appreciable pleural effusion. No pneumothorax. No acute osseous finding. IMPRESSION: 1. Patchy and hazy opacity within the mid and lower lungs bilaterally, which could reflect atelectasis or possibly infiltrates. 2. Underlying mild pulmonary interstitial congestion. Electronically Signed   By: Jeannine Boga M.D.   On: 11/13/2019 21:36   ECHOCARDIOGRAM COMPLETE  Result Date: 11/14/2019    ECHOCARDIOGRAM REPORT   Patient Name:   Dean Dixon Date of Exam: 11/14/2019 Medical Rec #:  [id number]    Height:       64.0 in Accession #:    [id number]   Weight:       195.0 lb Date of Birth:  01-30-1951   BSA:          1.935 m Patient Age:    34 years     BP:           158/72 mmHg Patient Gender: M            HR:           90 bpm. Exam Location:  Inpatient Procedure: 2D Echo, Cardiac Doppler and Color Doppler Indications:    Dyspnea 786.09 / R06.00  History:        Patient has no prior history of Echocardiogram examinations.                 Risk Factors:Hypertension, Diabetes and Non-Smoker.  Sonographer:     Vickie Epley RDCS Referring Phys: [id number] [place]  1. Left ventricular ejection fraction, by estimation, is 50 to 55%. The left ventricle has low  normal function. The left ventricle has no regional wall motion abnormalities. Left ventricular diastolic parameters are consistent with Grade I diastolic dysfunction (impaired relaxation).  2. Right ventricular systolic function is normal. The right ventricular size is normal.  3. The mitral valve is normal in structure. No evidence of mitral valve regurgitation. No evidence of mitral stenosis.  4. The aortic valve is normal in structure. Aortic valve regurgitation is not visualized. No aortic stenosis is present. FINDINGS  Left Ventricle: Left ventricular ejection fraction, by estimation, is 50 to 55%. The left ventricle has low normal function. The left ventricle has no regional wall motion abnormalities. The left ventricular internal cavity size was normal in size. There is no left ventricular hypertrophy. Left ventricular diastolic parameters are consistent with Grade I diastolic dysfunction (impaired relaxation). Right Ventricle: The right ventricular size is normal. No increase in right ventricular wall thickness. Right ventricular systolic function is normal. Left Atrium: Left atrial size was normal in size. Right Atrium: Right atrial size was normal in size. Pericardium: There is no evidence of pericardial effusion. Mitral Valve: The mitral valve is normal in structure. No evidence of mitral valve regurgitation. No evidence of mitral valve stenosis. Tricuspid Valve: The tricuspid valve is normal in structure. Tricuspid valve regurgitation is trivial. Aortic Valve: The aortic valve is normal in structure. Aortic valve regurgitation is not visualized. No aortic stenosis is present. Pulmonic Valve: The pulmonic valve was grossly normal. Pulmonic valve regurgitation is not visualized. Aorta: The aortic root and ascending aorta are structurally  normal, with no evidence of dilitation. IAS/Shunts: The atrial septum is grossly normal.  LEFT VENTRICLE PLAX 2D LVIDd:         4.60 cm      Diastology LVIDs:         3.00 cm      LV e' lateral:   9.90 cm/s LV PW:         0.90 cm      LV E/e' lateral: 7.2 LV IVS:        0.90 cm      LV e' medial:    6.42 cm/s LVOT diam:     1.90 cm      LV E/e' medial:  11.1 LV SV:         58 LV SV Index:   30 LVOT Area:     2.84 cm  LV Volumes (MOD) LV vol d, MOD A2C: 101.0 ml LV vol d, MOD A4C: 88.6 ml LV vol s, MOD A2C: 41.0 ml LV vol s, MOD A4C: 41.8 ml LV SV MOD A2C:     60.0 ml LV SV MOD A4C:     88.6 ml LV SV MOD BP:      52.8 ml RIGHT VENTRICLE RV S prime:     17.70 cm/s TAPSE (M-mode): 2.7 cm LEFT ATRIUM             Index       RIGHT ATRIUM          Index LA diam:        3.40 cm 1.76 cm/m  RA Area:     9.02 cm LA Vol (A2C):   27.9 ml 14.42 ml/m RA Volume:   14.80 ml 7.65 ml/m LA Vol (A4C):   35.0 ml 18.08 ml/m LA Biplane Vol: 33.8 ml 17.46 ml/m  AORTIC VALVE LVOT Vmax:   118.00 cm/s LVOT Vmean:  76.800 cm/s LVOT VTI:    0.204 m  AORTA Ao Root  diam: 3.30 cm MITRAL VALVE MV Area (PHT): 3.50 cm    SHUNTS MV Decel Time: 217 msec    Systemic VTI:  0.20 m MV E velocity: 71.10 cm/s  Systemic Diam: 1.90 cm MV A velocity: 78.80 cm/s MV E/A ratio:  0.90 Mertie Moores MD Electronically signed by Mertie Moores MD Signature Date/Time: 11/14/2019/4:33:03 PM    Final     ASSESSMENT & PLAN:   69 yo with   1) Mantle cell lymphoma S/p 4 cycles of BR + 1 additional cycle of Rituxan  2) Symptomatic Anemia -- ? Likely related to chemotherapy  3) Acute hypoxic respiratory failure. Flu like illness with fevers. Fevers now resolved with b/l interstitial infiltrated and b/l ground glass opacities SARS-COV2 neg  Concern for ? Atypical pneumonia vs Rituxan related ILD or hypersensitivity pneumonitis.  4) Dm2 -- expect hyperglycemia in acute illness and from steroids PLAN -transfuse PRBC prn for hgb <8 in the setting of  acute respiratory failure and limited physiologic reserves-- ordered 1 unit of PRBC for today. -PJP PCR on expectorated sputum (Sputum expectoration --ordered) -Viral respiratory panel/droplet precaution -time course and presentation concern for Rituxan-ILD or Hypersensitivity pneumonitis and absence of overt infectious findings at this time -- will start on High dose steroids -- SOlumedrol 125mg  IV q6h -will need mx of hyperglycemia - per hospitalist -would recommend pulmonary consultation  And consideration of FOB if respiratory status not improving quickly and no clear explanation on workup. -oncology will continue to follow -no plan for addition chemo-immunotherapy at this time.   I spent 30 minutes counseling the patient face to face. The total time spent in the appointment was 60 minutes and more than 50% was on counseling and direct patient cares.    Sullivan Lone MD [place] AAHIVMS [facility] Hematology/Oncology Physician [facility]  (Office):       [phone] (Work cell):  [phone] (Fax):           [phone]

## 2019-11-14 NOTE — Progress Notes (Signed)
Initial Nutrition Assessment  DOCUMENTATION CODES:   Obesity unspecified  INTERVENTION:   -Ensure Enlive po BID, each supplement provides 350 kcal and 20 grams of protein -Multivitamin with minerals daily  NUTRITION DIAGNOSIS:   Increased nutrient needs related to cancer and cancer related treatments as evidenced by estimated needs.  GOAL:   Patient will meet greater than or equal to 90% of their needs  MONITOR:   PO intake, Supplement acceptance, Labs, Weight trends, I & O's  REASON FOR ASSESSMENT:   Malnutrition Screening Tool    ASSESSMENT:   69 y.o. male with medical history significant for mantle cell lymphoma, type 2 diabetes mellitus, and hypertension, now presenting to the emergency department for evaluation of shortness of breath.  Patient reports progressive dyspnea for close to 2 weeks now  **RD working remotely**  Pt currently undergoing chemotherapy for mantle cell lymphoma. Per chart review, pt with SOB for ~2 weeks now. Pt with reported poor appetite. Pt consumed 100% of lunch tray which was just a piece of chicken, no sides. Will order Ensure supplements and daily MVI.  Per weight records ,pt has lost 41 lbs since 06/14/19 (17% wt loss x 5 months, significant for time frame).  Medications reviewed. Labs reviewed:  CBGs: 136  NUTRITION - FOCUSED PHYSICAL EXAM:  RD working remotely.  Diet Order:   Diet Order            Diet heart healthy/carb modified Room service appropriate? Yes; Fluid consistency: Thin  Diet effective now              EDUCATION NEEDS:   No education needs have been identified at this time  Skin:  Skin Assessment: Reviewed RN Assessment  Last BM:  5/24 -type 6  Height:   Ht Readings from Last 1 Encounters:  11/13/19 5\' 4"  (1.626 m)    Weight:   Wt Readings from Last 1 Encounters:  11/13/19 88.5 kg   BMI:  Body mass index is 33.47 kg/m.  Estimated Nutritional Needs:   Kcal:  1900-2100  Protein:   80-90g  Fluid:  2L/day  Clayton Bibles, MS, RD, LDN Inpatient Clinical Dietitian Contact information available via Amion

## 2019-11-14 NOTE — Progress Notes (Signed)
Care started prior to midnight in the emergency room and patient was admitted early this morning after midnight by my partner and colleague Dr. Christia Reading Opyd and I am in current agreement with this assessment and plan.  Additional changes to the plan of care been made accordingly.  Patient is a obese 69 year old Caucasian male with a past medical history significant for but not limited to mantle cell lymphoma, diabetes mellitus type 2, hypertension as well as other comorbidities who presents to the emergency room with a chief complaint of shortness of breath.  He has been progressively dyspneic for almost 2 weeks now and felt as if he is having fevers early in the course but has not been coughing much.  He is denying any leg swelling or tenderness but does state that he has had recent development of orthopnea.  He is scheduled for chemo therapy infusion on 11/10/2019 but is canceled due to his shortness of breath.  With his symptoms continuing to worsen and with him unable to perform any of his usual activities without significant dyspnea he came to the emergency room for further evaluation and was found to be saturating in the 80s on room air while at rest.  He is tachycardic and tachypneic with a stable blood pressure.  Chest x-ray showed patchy hazy opacities bilaterally.  CT of the chest was done and was negative for PE but was notable for diffuse bilateral hazy groundglass opacities that could reflect atypical infection or edema.  He was started on treatment with vancomycin, cefepime and azithromycin and was placed on 5 L of supplemental oxygen. He is currently being treated for the following but not limited too:  Acute Hypoxic Respiratory Failure  -Presents with close to 2 weeks of SOB, had subjective fevers early in the course, no significant cough, no swelling but reports recent orthopnea, and is found to be saturating low 80s on rm air with CTA chest findings concerning for atypical PNA or edema  - COVID  pcr is negative  - Blood cultures were collected in ED and he was treated with supplemental O2, vancomycin, cefepime, and Azithromycin  -Continue Supplemental O2 via [place] and wean O2 as tolerated  -Continuous Pulse Oximetry and Maintain O2 Sats >92% -SpO2: 95 % O2 Flow Rate (L/min): 5 L/min -Check sputum culture and strep pneumo and legionella antigens, ] -Check echocardiogram, and continue empiric antibiotics and supplemental O2   -Will need an Ambulatory Home O2 screen prior to D/C   Mantle Cell Lymphoma  -Undergoing chemotherapy, missed most recent infusion due to the current respiratory illness  -His oncology team will be notified of the admission; Sees Dr. Irene Limbo   Type II DM  -HbA1c this visit shows that it is 5.4, serum glucose 197 in ED  -Check CBGs, use SSI with Novolog for now   -CBG this AM was 200  Hypertension  -BP at Goal -Continue Ramipril and Amlodipine as tolerated    Normocytic Anemia -Counts appear stable but have dropped from admission as Hgb/Hct went from 8.8/27.7 -> 7.7/24.6 -Patient denies bleeding  -Check Anemia Panel in the AM -Continue to Monitor for S/Sx of Bleeding; Currently no overt bleeding noted -Repeat CBC in the AM  Metabolic Acidosis -Mild with a CO2 of 20, AG of 10, and Chloride Level of 106 -Continue to Monitor and Trend -Repeat CMP in AM    Thrombocytopenia -Mild. Patient's Platelet Count went from 162 -> 133 -Continue to Monitor for S/Sx of Bleeding -Repeat CBC in the AM  Obesity -Estimated body mass index is 33.47 kg/m as calculated from the following:   Height as of this encounter: 5\' 4"  (1.626 m).   Weight as of this encounter: 88.5 kg. -Weight Loss and Dietary Counseling   We will continue to monitor the patient's clinical response to intervention and repeat blood work and imaging in the a.m.

## 2019-11-14 NOTE — Progress Notes (Signed)

## 2019-11-14 NOTE — Progress Notes (Signed)
Pharmacy Antibiotic Note  Dean Dixon is a 69 y.o. male admitted on 11/13/2019 with sepsis.  Pharmacy has been consulted for Vancomycin, cefepime dosing.  Plan: Vancomycin 1.5gm iv x1, then Vancomycin 750 mg IV Q 12 hrs. Goal AUC 400-550. Expected AUC: 528 SCr used: 0.8 Vd: 0.5 L/kg  Cefepime 2gm iv x1, then 2gm iv q8hr  Height: 5\' 4"  (162.6 cm) Weight: 88.5 kg (195 lb) IBW/kg (Calculated) : 59.2  Temp (24hrs), Avg:98.8 F (37.1 C), Min:98.1 F (36.7 C), Max:99.5 F (37.5 C)  Recent Labs  Lab 11/10/19 0827 11/13/19 2144 11/13/19 2155 11/13/19 2159 11/14/19 0012  WBC 5.2 5.5  --   --   --   CREATININE 0.95 0.89 0.80  --   --   LATICACIDVEN  --   --   --  1.2 1.0    Estimated Creatinine Clearance: 86.2 mL/min (by C-G formula based on SCr of 0.8 mg/dL).    Allergies  Allergen Reactions  . Ivp Dye [Iodinated Diagnostic Agents] Shortness Of Breath    "allergic to CT scan dye", took benadryl with last scan and tolerated well    Antimicrobials this admission: Vancomycin 11/14/2019 >> Cefepime 11/14/2019 >>  Dose adjustments this admission: -  Microbiology results: -  Thank you for allowing pharmacy to be a part of this patient's care.  [name] 11/14/2019 6:10 AM

## 2019-11-14 NOTE — Progress Notes (Signed)
  Echocardiogram 2D Echocardiogram has been performed.  Geoffery Lyons Swaim 11/14/2019, 4:08 PM

## 2019-11-14 NOTE — ED Notes (Signed)
Pt transported to CT scan.

## 2019-11-14 NOTE — H&P (Signed)
History and Physical    Dean Dixon [id number] DOB: 1950-10-20 DOA: 11/13/2019  PCP: Leonides Sake, MD   Patient coming from: Home   Chief Complaint: SOB   HPI: Dean Dixon is a 69 y.o. male with medical history significant for mantle cell lymphoma, type 2 diabetes mellitus, and hypertension, now presenting to the emergency department for evaluation of shortness of breath.  Patient reports progressive dyspnea for close to 2 weeks now, felt as though he was having fevers early in the course, has not been coughing much, denies any leg swelling or tenderness, denies chest pain, but reports recent development of orthopnea.  Patient states that he was scheduled for chemotherapy infusion on 11/10/2019 but this was canceled due to the current illness.  With his symptoms continuing to worsen and unable to perform any of his usual activities without significant dyspnea, he came into the ED for evaluation. ROS positive for chronic loose stools.   ED Course: Upon arrival to the ED, patient is found to be afebrile, saturating 81% on room air while at rest, slightly tachycardic, tachypneic, and with stable blood pressure.  Chest x-ray with patchy hazy opacities bilaterally.  CTA chest is limited but negative for PE and notable for diffuse bilateral hazy groundglass opacities that could reflect atypical infection or edema.  Chemistry panel with glucose 197.  CBC with stable normocytic anemia.  Lactic acid reassuringly normal.  Troponin negative x2 and BNP is also normal.  Blood cultures were collected in the ED and the patient was treated with vancomycin, cefepime, and azithromycin.  He is requiring 5 L/min of supplemental oxygen.  Review of Systems:  All other systems reviewed and apart from HPI, are negative.  Past Medical History:  Diagnosis Date  . Cancer ([place])    Mantle Cell Lymphoma  . Diabetes mellitus without complication ([place])   . Hypertension     Past Surgical History:  Procedure  Laterality Date  . APPENDECTOMY    . TONSILLECTOMY       reports that he has never smoked. He has never used smokeless tobacco. He reports current alcohol use of about 2.0 standard drinks of alcohol per week. He reports that he does not use drugs.  Allergies  Allergen Reactions  . Ivp Dye [Iodinated Diagnostic Agents] Shortness Of Breath    "allergic to CT scan dye", took benadryl with last scan and tolerated well    History reviewed. No pertinent family history.   Prior to Admission medications   Medication Sig Start Date End Date Taking? Authorizing Provider  allopurinol (ZYLOPRIM) 100 MG tablet Take 1 tablet (100 mg total) by mouth 2 (two) times daily. 06/20/19  Yes Brunetta Genera, MD  amLODipine (NORVASC) 10 MG tablet Take 10 mg by mouth daily.   Yes [provider]  Ascorbic Acid (VITAMIN C) 500 MG CAPS Take 1 capsule by mouth daily.   Yes [provider]  CANNABIDIOL PO Take 30 mg by mouth daily.   Yes [provider]  Coenzyme Q10 (COQ-10 PO) Take 1 capsule by mouth daily. Takes CoQ 10 w/cinnamon   Yes [provider]  glipiZIDE (GLUCOTROL XL) 10 MG 24 hr tablet Take 20 mg by mouth every morning. 08/13/19  Yes [provider]  Glucosamine-Chondroitin (GLUCOSAMINE CHONDR COMPLEX PO) Take 1 tablet by mouth daily.   Yes [provider]  LORazepam (ATIVAN) 0.5 MG tablet Take 1 tablet (0.5 mg total) by mouth every 6 (six) hours as needed (Nausea or vomiting). 06/20/19  Yes Brunetta Genera, MD  metFORMIN (GLUCOPHAGE-XR) 500 MG 24 hr tablet Take 500 mg by mouth in the morning and at bedtime.  04/09/19  Yes [provider]  Multiple Vitamin (MULTIVITAMIN) tablet Take 1 tablet by mouth daily.   Yes [provider]  ondansetron (ZOFRAN) 8 MG tablet Take 1 tablet (8 mg total) by mouth 2 (two) times daily as needed for refractory nausea / vomiting. Start on day 2 after bendamustine chemotherapy. 06/20/19  Yes  Brunetta Genera, MD  prochlorperazine (COMPAZINE) 10 MG tablet Take 1 tablet (10 mg total) by mouth every 6 (six) hours as needed (Nausea or vomiting). 06/20/19  Yes Brunetta Genera, MD  ramipril (ALTACE) 10 MG capsule Take 10 mg by mouth daily.   Yes [provider]  acyclovir (ZOVIRAX) 400 MG tablet Take 1 tablet (400 mg total) by mouth daily. Patient not taking: Reported on 11/13/2019 06/20/19   Brunetta Genera, MD  dexamethasone (DECADRON) 4 MG tablet Take 2 tablets (8 mg total) by mouth daily. Start the day after bendamustine chemotherapy for 2 days. Take with food. Patient not taking: Reported on 11/13/2019 06/20/19   Brunetta Genera, MD    Physical Exam: Vitals:   11/14/19 0116 11/14/19 0130 11/14/19 0200 11/14/19 0230  BP: (!) 144/68 135/75 132/75 137/71  Pulse: 92 91 88 88  Resp: (!) 31 (!) 26 (!) 32 (!) 33  Temp: 98.1 F (36.7 C)     TempSrc: Oral     SpO2: 92% 94% 95% 93%  Weight:      Height:         Constitutional: Not in acute distress, no diaphoresis   Eyes: PERTLA, lids and conjunctivae normal ENMT: Mucous membranes are moist. Posterior pharynx clear of any exudate or lesions.   Neck: normal, supple, no masses, no thyromegaly Respiratory: Mild tachypneia, speaking full sentences. No pallor or cyanosis.  Cardiovascular: S1 & S2 heard, regular rate and rhythm. No extremity edema.   Abdomen: No distension, no tenderness, soft. Bowel sounds active.  Musculoskeletal: no clubbing / cyanosis. No joint deformity upper and lower extremities.   Skin: no significant rashes, lesions, ulcers. Warm, dry, well-perfused. Neurologic:  No gross facial asymmetry. Sensation intact. Moving all extremities.  Psychiatric: Alert and oriented to person, place, and situation. Pleasant and cooperative.    Labs and Imaging on Admission: I have personally reviewed following labs and imaging studies  CBC: Recent Labs  Lab 11/10/19 0827 11/13/19 2144  11/13/19 2155  WBC 5.2 5.5  --   NEUTROABS 3.7 4.3  --   HGB 8.9* 8.8* 8.8*  HCT 27.5* 27.7* 26.0*  MCV 88.7 91.7  --   PLT 172 162  --    Basic Metabolic Panel: Recent Labs  Lab 11/10/19 0827 11/13/19 2144 11/13/19 2155  NA 136 137 137  K 3.8 3.6 3.6  CL 103 106 105  CO2 21* 20*  --   GLUCOSE 208* 197* 190*  BUN 15 14 12   CREATININE 0.95 0.89 0.80  CALCIUM 8.7* 8.3*  --    GFR: Estimated Creatinine Clearance: 86.2 mL/min (by C-G formula based on SCr of 0.8 mg/dL). Liver Function Tests: Recent Labs  Lab 11/10/19 0827 11/13/19 2144  AST 18 17  ALT 16 15  ALKPHOS 71 59  BILITOT 0.8 0.8  PROT 5.9* 5.8*  ALBUMIN 3.1* 3.3*   No results for input(s): LIPASE, AMYLASE in the last 168 hours. No results for input(s): AMMONIA in the last 168  hours. Coagulation Profile: No results for input(s): INR, PROTIME in the last 168 hours. Cardiac Enzymes: No results for input(s): CKTOTAL, CKMB, CKMBINDEX, TROPONINI in the last 168 hours. BNP (last 3 results) No results for input(s): PROBNP in the last 8760 hours. HbA1C: No results for input(s): HGBA1C in the last 72 hours. CBG: No results for input(s): GLUCAP in the last 168 hours. Lipid Profile: No results for input(s): CHOL, HDL, LDLCALC, TRIG, CHOLHDL, LDLDIRECT in the last 72 hours. Thyroid Function Tests: No results for input(s): TSH, T4TOTAL, FREET4, T3FREE, THYROIDAB in the last 72 hours. Anemia Panel: No results for input(s): VITAMINB12, FOLATE, FERRITIN, TIBC, IRON, RETICCTPCT in the last 72 hours. Urine analysis:    Component Value Date/Time   COLORURINE YELLOW 10/23/2014 [place] 10/23/2014 1754   LABSPEC 1.021 10/23/2014 1754   PHURINE 5.5 10/23/2014 1754   GLUCOSEU NEGATIVE 10/23/2014 1754   HGBUR NEGATIVE 10/23/2014 1754   BILIRUBINUR NEGATIVE 10/23/2014 1754   KETONESUR 15 (A) 10/23/2014 1754   PROTEINUR NEGATIVE 10/23/2014 1754   UROBILINOGEN 0.2 10/23/2014 1754   NITRITE NEGATIVE  10/23/2014 1754   LEUKOCYTESUR NEGATIVE 10/23/2014 1754   Sepsis Labs: @LABRCNTIP (procalcitonin:4,lacticidven:4) ) Recent Results (from the past 240 hour(s))  SARS Coronavirus 2 by RT PCR (hospital order, performed in [place] hospital lab) Nasopharyngeal Nasopharyngeal Swab     Status: None   Collection Time: 11/13/19  9:30 PM   Specimen: Nasopharyngeal Swab  Result Value Ref Range Status   SARS Coronavirus 2 NEGATIVE NEGATIVE Final    Comment: (NOTE) SARS-CoV-2 target nucleic acids are NOT DETECTED. The SARS-CoV-2 RNA is generally detectable in upper and lower respiratory specimens during the acute phase of infection. The lowest concentration of SARS-CoV-2 viral copies this assay can detect is 250 copies / mL. A negative result does not preclude SARS-CoV-2 infection and should not be used as the sole basis for treatment or other patient management decisions.  A negative result may occur with improper specimen collection / handling, submission of specimen other than nasopharyngeal swab, presence of viral mutation(s) within the areas targeted by this assay, and inadequate number of viral copies (<250 copies / mL). A negative result must be combined with clinical observations, patient history, and epidemiological information. Fact Sheet for Patients:   StrictlyIdeas.no Fact Sheet for Healthcare Providers: BankingDealers.co.za This test is not yet approved or cleared  by the [place] FDA and has been authorized for detection and/or diagnosis of SARS-CoV-2 by FDA under an Emergency Use Authorization (EUA).  This EUA will remain in effect (meaning this test can be used) for the duration of the COVID-19 declaration under Section 564(b)(1) of the Act, 21 U.S.C. section 360bbb-3(b)(1), unless the authorization is terminated or revoked sooner. Performed at [facility], [place] [address] [place], [place] [address]       Radiological Exams on Admission: CT Angio Chest PE W and/or Wo Contrast  Result Date: 11/14/2019 CLINICAL DATA:  Shortness of breath. EXAM: CT ANGIOGRAPHY CHEST WITH CONTRAST TECHNIQUE: Multidetector CT imaging of the chest was performed using the standard protocol during bolus administration of intravenous contrast. Multiplanar CT image reconstructions and MIPs were obtained to evaluate the vascular anatomy. CONTRAST:  169mL OMNIPAQUE IOHEXOL 350 MG/ML SOLN COMPARISON:  PET-CT dated September 19, 2018 FINDINGS: Cardiovascular: Evaluation is limited by respiratory motion artifact. Given this limitation, there was no acute pulmonary embolism detected.The heart size is normal. There is no significant pericardial effusion. There is no evidence for thoracic aortic dissection  or aneurysm. Mediastinum/Nodes: --No mediastinal or hilar lymphadenopathy. --No axillary lymphadenopathy. --No supraclavicular lymphadenopathy. --Normal thyroid gland. --The esophagus is unremarkable Lungs/Pleura: There are hazy bilateral ground-glass airspace opacities. There is some interlobular septal thickening. There are trace bilateral pleural effusions. There is atelectasis at the lung bases. There is no pneumothorax. The trachea is unremarkable. Upper Abdomen: Large cyst is again noted in the patient's left hepatic lobe. There is persistent splenomegaly. There is a heterogeneous appearance of the splenic parenchyma which is felt to be secondary to contrast timing in the absence of left upper quadrant abdominal pain. Musculoskeletal: No chest wall abnormality. No acute or significant osseous findings. Review of the MIP images confirms the above findings. IMPRESSION: 1. Evaluation for pulmonary emboli is limited by respiratory motion artifact. Given this limitation, no acute pulmonary embolism was detected. 2. There are diffuse bilateral hazy ground-glass airspace opacities which may represent an atypical infectious process or pulmonary  edema. 3. Splenomegaly. Electronically Signed   By: Constance Holster M.D.   On: 11/14/2019 01:36   DG Chest Port 1 View  Result Date: 11/13/2019 CLINICAL DATA:  Initial evaluation for acute shortness of breath, weakness. EXAM: PORTABLE CHEST 1 VIEW COMPARISON:  Prior radiograph from 11/10/2019. FINDINGS: Exaggeration of the cardiac silhouette related AP technique and low lung volumes. Mediastinal silhouette normal. Lungs hypoinflated. Patchy and hazy opacity seen within the mid and lower lungs bilaterally, which could reflect atelectasis or possibly infiltrates. Underlying mild pulmonary interstitial congestion. No appreciable pleural effusion. No pneumothorax. No acute osseous finding. IMPRESSION: 1. Patchy and hazy opacity within the mid and lower lungs bilaterally, which could reflect atelectasis or possibly infiltrates. 2. Underlying mild pulmonary interstitial congestion. Electronically Signed   By: Jeannine Boga M.D.   On: 11/13/2019 21:36    Assessment/Plan  1. Acute hypoxic respiratory failure  - Presents with close to 2 weeks of SOB, had subjective fevers early in the course, no significant cough, no swelling but reports recent orthopnea, and is found to be saturating low 80s on rm air with CTA chest findings concerning for atypical PNA or edema  - COVID pcr is negative  - Blood cultures were collected in ED and he was treated with supplemental O2, vancomycin, cefepime, and azithromycin  - Check sputum culture and strep pneumo and legionella antigens, check echocardiogram, and continue empiric antibiotics and supplemental O2    2. Mantle cell lymphoma  - Undergoing chemotherapy, missed most recent infusion due to the current respiratory illness  - His oncology team will be notified of the admission    3. Type II DM  - No A1c available, serum glucose 197 in ED  - Check CBGs, use SSI with Novolog for now    4. Hypertension  - BP at goal, continue ramipril and Norvasc as  tolerated    5. Anemia - Counts appear stable, patient denies bleeding    DVT prophylaxis: Lovenox  Code Status: Full  Family Communication: Discussed with patient  Disposition Plan:  Patient is from: Home  Anticipated d/c is to: Home  Anticipated d/c date is: 11/17/19 Patient currently: Has new supplemental O2 requirement requiring further inpatient work-up and treatment  Consults called: none  Admission status: Inpatient     Vianne Bulls, MD [facility] Pager: See [email or web address]  If 7AM-7PM, please contact the daytime attending [email or web address]  11/14/2019, 3:10 AM

## 2019-11-14 NOTE — ED Provider Notes (Signed)
CT chest negative for PE, but does have pneumonia.  Covid negative Broad-spectrum antibiotics ordered.  Discussed with Dr. Myna Hidalgo for admission to the hospitalist  ED ECG REPORT   Date: 11/13/2019 2101  Rate: 103  Rhythm: sinus tachycardia  QRS Axis: normal  Intervals: normal  ST/T Wave abnormalities: normal  Conduction Disutrbances:none Artifact noted  I have personally reviewed the EKG tracing and agree with the computerized printout as noted.    Ripley Fraise, MD 11/14/19 [phone]

## 2019-11-15 ENCOUNTER — Inpatient Hospital Stay (HOSPITAL_COMMUNITY): Payer: Medicare Other

## 2019-11-15 DIAGNOSIS — J849 Interstitial pulmonary disease, unspecified: Secondary | ICD-10-CM

## 2019-11-15 LAB — CBC WITH DIFFERENTIAL/PLATELET
Abs Immature Granulocytes: 0.12 10*3/uL — ABNORMAL HIGH (ref 0.00–0.07)
Basophils Absolute: 0 10*3/uL (ref 0.0–0.1)
Basophils Relative: 0 %
Eosinophils Absolute: 0 10*3/uL (ref 0.0–0.5)
Eosinophils Relative: 0 %
HCT: 31.8 % — ABNORMAL LOW (ref 39.0–52.0)
Hemoglobin: 9.7 g/dL — ABNORMAL LOW (ref 13.0–17.0)
Immature Granulocytes: 3 %
Lymphocytes Relative: 9 %
Lymphs Abs: 0.3 10*3/uL — ABNORMAL LOW (ref 0.7–4.0)
MCH: 28.2 pg (ref 26.0–34.0)
MCHC: 30.5 g/dL (ref 30.0–36.0)
MCV: 92.4 fL (ref 80.0–100.0)
Monocytes Absolute: 0.1 10*3/uL (ref 0.1–1.0)
Monocytes Relative: 2 %
Neutro Abs: 3 10*3/uL (ref 1.7–7.7)
Neutrophils Relative %: 86 %
Platelets: 137 10*3/uL — ABNORMAL LOW (ref 150–400)
RBC: 3.44 MIL/uL — ABNORMAL LOW (ref 4.22–5.81)
RDW: 17 % — ABNORMAL HIGH (ref 11.5–15.5)
WBC: 3.5 10*3/uL — ABNORMAL LOW (ref 4.0–10.5)
nRBC: 0 % (ref 0.0–0.2)

## 2019-11-15 LAB — COMPREHENSIVE METABOLIC PANEL
ALT: 14 U/L (ref 0–44)
AST: 15 U/L (ref 15–41)
Albumin: 3 g/dL — ABNORMAL LOW (ref 3.5–5.0)
Alkaline Phosphatase: 55 U/L (ref 38–126)
Anion gap: 10 (ref 5–15)
BUN: 15 mg/dL (ref 8–23)
CO2: 20 mmol/L — ABNORMAL LOW (ref 22–32)
Calcium: 8.5 mg/dL — ABNORMAL LOW (ref 8.9–10.3)
Chloride: 108 mmol/L (ref 98–111)
Creatinine, Ser: 0.73 mg/dL (ref 0.61–1.24)
GFR calc Af Amer: >60 mL/min (ref >60)
GFR calc non Af Amer: >60 mL/min (ref >60)
Glucose, Bld: 315 mg/dL — ABNORMAL HIGH (ref 70–99)
Potassium: 4.5 mmol/L (ref 3.5–5.1)
Sodium: 138 mmol/L (ref 135–145)
Total Bilirubin: 0.9 mg/dL (ref 0.3–1.2)
Total Protein: 5.6 g/dL — ABNORMAL LOW (ref 6.5–8.1)

## 2019-11-15 LAB — RESPIRATORY PANEL BY PCR

## 2019-11-15 LAB — GLUCOSE, CAPILLARY
Glucose-Capillary: 317 mg/dL — ABNORMAL HIGH (ref 70–99)
Glucose-Capillary: 389 mg/dL — ABNORMAL HIGH (ref 70–99)
Glucose-Capillary: 365 mg/dL — ABNORMAL HIGH (ref 70–99)
Glucose-Capillary: 370 mg/dL — ABNORMAL HIGH (ref 70–99)
Glucose-Capillary: 445 mg/dL — ABNORMAL HIGH (ref 70–99)

## 2019-11-15 LAB — PHOSPHORUS: Phosphorus: 4.7 mg/dL — ABNORMAL HIGH (ref 2.5–4.6)

## 2019-11-15 LAB — MAGNESIUM: Magnesium: 2.3 mg/dL (ref 1.7–2.4)

## 2019-11-15 LAB — ABO/RH: ABO/RH(D): O NEG

## 2019-11-15 MED ORDER — PANTOPRAZOLE SODIUM 40 MG PO TBEC
40.0000 mg | DELAYED_RELEASE_TABLET | Freq: Every day | ORAL | Status: DC
  Administered 2019-11-15 – 2019-11-17 (×3): 40 mg via ORAL
  Filled 2019-11-15 (×3): qty 1

## 2019-11-15 MED ORDER — AZITHROMYCIN 250 MG PO TABS
500.0000 mg | ORAL_TABLET | Freq: Every day | ORAL | Status: DC
  Administered 2019-11-15 – 2019-11-16 (×2): 500 mg via ORAL
  Filled 2019-11-15 (×2): qty 2

## 2019-11-15 MED ORDER — INSULIN ASPART 100 UNIT/ML ~~LOC~~ SOLN
3.0000 [IU] | Freq: Three times a day (TID) | SUBCUTANEOUS | Status: DC
  Administered 2019-11-15 – 2019-11-17 (×6): 3 [IU] via SUBCUTANEOUS

## 2019-11-15 MED ORDER — INSULIN DETEMIR 100 UNIT/ML ~~LOC~~ SOLN
10.0000 [IU] | Freq: Every day | SUBCUTANEOUS | Status: DC
  Administered 2019-11-15 – 2019-11-16 (×2): 10 [IU] via SUBCUTANEOUS
  Filled 2019-11-15 (×3): qty 0.1

## 2019-11-15 NOTE — Progress Notes (Signed)
Inpatient Diabetes Program Recommendations  AACE/ADA: New Consensus Statement on Inpatient Glycemic Control (2015)  Target Ranges:  Prepandial:   less than 140 mg/dL      Peak postprandial:   less than 180 mg/dL (1-2 hours)      Critically ill patients:  140 - 180 mg/dL   Lab Results  Component Value Date   GLUCAP 317 (H) 11/15/2019   HGBA1C 5.4 11/14/2019    Review of Glycemic Control Results for JAY, RADACH" (MRN [id number]) as of 11/15/2019 10:50  Ref. Range 11/14/2019 12:59 11/14/2019 16:46 11/14/2019 21:20 11/15/2019 07:28  Glucose-Capillary Latest Ref Range: 70 - 99 mg/dL 136 (H) 138 (H) 239 (H) 317 (H)   Diabetes history: DM 2 Outpatient Diabetes medications:  Glipizide 20 mg q AM, Metformin 500 mg bid Current orders for Inpatient glycemic control:  Novolog sensitive tid with meals and HS Solumedrol 125 mg IV q 6 hours Inpatient Diabetes Program Recommendations:    While on steroids, consider adding Levemir 10 units q HS and Novolog 3 units tid with meals.   Thanks  Adah Perl, RN, BC-ADM Inpatient Diabetes Coordinator Pager [phone] (8a-5p)

## 2019-11-15 NOTE — Progress Notes (Addendum)
Marland Kitchen   HEMATOLOGY/ONCOLOGY INPATIENT PROGRESS NOTE  Date of Service: 11/15/2019  Inpatient Attending: .Sheikh, Omair Latif, DO  SUBJECTIVE  Breathing has improved today.  He is down to 2 L O2.  Denies cough.  He has no other complaints today.  PHYSICAL EXAMINATION: . Vitals:   11/15/19 0258 11/15/19 0428 11/15/19 0500 11/15/19 0530  BP: 117/72 117/73    Pulse: 69 71    Resp: 20 19    Temp: 97.6 F (36.4 C) 97.7 F (36.5 C)    TempSrc:  Oral    SpO2: 94% 95%  94%  Weight:   91.1 kg   Height:       Filed Weights   11/13/19 2131 11/15/19 0500  Weight: 88.5 kg 91.1 kg   .Body mass index is 34.47 kg/m.  GENERAL:alert SKIN: skin color, texture, turgor are normal, no rashes or significant lesions EYES: normal, conjunctiva are pink and non-injected, sclera clear OROPHARYNX:no exudate, no erythema and lips, buccal mucosa, and tongue normal  NECK: supple, no JVD, thyroid normal size, non-tender, without nodularity LYMPH:  no palpable lymphadenopathy in the cervical, axillary or inguinal LUNGS: b/l rales at the bases no rhonci HEART: regular rate & rhythm,  no murmurs and no lower extremity edema ABDOMEN: abdomen soft, non-tender, normoactive bowel sounds  Musculoskeletal: no cyanosis of digits and no clubbing  PSYCH: alert & oriented x 3 with fluent speech NEURO: no focal motor/sensory deficits  MEDICAL HISTORY:  Past Medical History:  Diagnosis Date  . Cancer ([place])    Mantle Cell Lymphoma  . Diabetes mellitus without complication ([place])   . Hypertension     SURGICAL HISTORY: Past Surgical History:  Procedure Laterality Date  . APPENDECTOMY    . TONSILLECTOMY      SOCIAL HISTORY: Social History   Socioeconomic History  . Marital status: Divorced    Spouse name: Not on file  . Number of children: Not on file  . Years of education: Not on file  . Highest education level: Not on file  Occupational History  . Not on file  Tobacco Use  . Smoking status: Never  Smoker  . Smokeless tobacco: Never Used  Substance and Sexual Activity  . Alcohol use: Yes    Alcohol/week: 2.0 standard drinks    Types: 2 Standard drinks or equivalent per week  . Drug use: No  . Sexual activity: Not on file  Other Topics Concern  . Not on file  Social History Narrative  . Not on file   Social Determinants of Health   Financial Resource Strain:   . Difficulty of Paying Living Expenses:   Food Insecurity:   . Worried About Charity fundraiser in the Last Year:   . Arboriculturist in the Last Year:   Transportation Needs:   . Film/video editor (Medical):   Marland Kitchen Lack of Transportation (Non-Medical):   Physical Activity:   . Days of Exercise per Week:   . Minutes of Exercise per Session:   Stress:   . Feeling of Stress :   Social Connections:   . Frequency of Communication with Friends and Family:   . Frequency of Social Gatherings with Friends and Family:   . Attends Religious Services:   . Active Member of Clubs or Organizations:   . Attends Archivist Meetings:   Marland Kitchen Marital Status:   Intimate Partner Violence:   . Fear of Current or Ex-Partner:   . Emotionally Abused:   Marland Kitchen Physically  Abused:   . Sexually Abused:     FAMILY HISTORY: History reviewed. No pertinent family history.  ALLERGIES:  is allergic to ivp dye [iodinated diagnostic agents].  MEDICATIONS:  Scheduled Meds: . amLODipine  10 mg Oral Daily  . azithromycin  500 mg Oral QHS  . enoxaparin (LOVENOX) injection  40 mg Subcutaneous Q24H  . feeding supplement (ENSURE ENLIVE)  237 mL Oral BID BM  . insulin aspart  0-5 Units Subcutaneous QHS  . insulin aspart  0-9 Units Subcutaneous TID WC  . insulin aspart  3 Units Subcutaneous TID WC  . insulin detemir  10 Units Subcutaneous QHS  . methylPREDNISolone (SOLU-MEDROL) injection  125 mg Intravenous Q8H  . multivitamin with minerals  1 tablet Oral Daily  . pantoprazole  40 mg Oral Q1200  . ramipril  10 mg Oral Daily  . sodium  chloride flush  3 mL Intravenous Q12H   Continuous Infusions: . sodium chloride    . ceFEPime (MAXIPIME) IV 2 g (11/15/19 0533)   PRN Meds:.sodium chloride, acetaminophen **OR** acetaminophen, heparin lock flush, heparin lock flush, HYDROcodone-acetaminophen, loperamide, LORazepam, ondansetron **OR** ondansetron (ZOFRAN) IV, senna-docusate, sodium chloride flush, sodium chloride flush, sodium chloride flush  REVIEW OF SYSTEMS:    10 Point review of Systems was done is negative except as noted above.   LABORATORY DATA:  I have reviewed the data as listed  . CBC Latest Ref Rng & Units 11/15/2019 11/14/2019 11/13/2019  WBC 4.0 - 10.5 K/uL 3.5(L) 3.8(L) -  Hemoglobin 13.0 - 17.0 g/dL 9.7(L) 7.7(L) 8.8(L)  Hematocrit 39.0 - 52.0 % 31.8(L) 24.6(L) 26.0(L)  Platelets 150 - 400 K/uL 137(L) 133(L) -    . CMP Latest Ref Rng & Units 11/15/2019 11/14/2019 11/13/2019  Glucose 70 - 99 mg/dL 315(H) 244(H) 190(H)  BUN 8 - 23 mg/dL 15 14 12   Creatinine 0.61 - 1.24 mg/dL 0.73 0.78 0.80  Sodium 135 - 145 mmol/L 138 136 137  Potassium 3.5 - 5.1 mmol/L 4.5 4.1 3.6  Chloride 98 - 111 mmol/L 108 106 105  CO2 22 - 32 mmol/L 20(L) 20(L) -  Calcium 8.9 - 10.3 mg/dL 8.5(L) 7.8(L) -  Total Protein 6.5 - 8.1 g/dL 5.6(L) - -  Total Bilirubin 0.3 - 1.2 mg/dL 0.9 - -  Alkaline Phos 38 - 126 U/L 55 - -  AST 15 - 41 U/L 15 - -  ALT 0 - 44 U/L 14 - -     RADIOGRAPHIC STUDIES: I have personally reviewed the radiological images as listed and agreed with the findings in the report. DG Chest 2 View  Result Date: 11/10/2019 CLINICAL DATA:  Mantle cell lymphoma.  Fever and shortness of breath EXAM: CHEST - 2 VIEW COMPARISON:  Oct 23, 2014 chest radiograph and chest CT FINDINGS: The lungs are clear. The heart size and pulmonary vascularity are normal. No adenopathy. There is degenerative change in the thoracic spine. IMPRESSION: Lungs clear.  Cardiac silhouette within normal limits. Electronically Signed   By: Lowella Grip III M.D.   On: 11/10/2019 10:03   CT Angio Chest PE W and/or Wo Contrast  Result Date: 11/14/2019 CLINICAL DATA:  Shortness of breath. EXAM: CT ANGIOGRAPHY CHEST WITH CONTRAST TECHNIQUE: Multidetector CT imaging of the chest was performed using the standard protocol during bolus administration of intravenous contrast. Multiplanar CT image reconstructions and MIPs were obtained to evaluate the vascular anatomy. CONTRAST:  191mL OMNIPAQUE IOHEXOL 350 MG/ML SOLN COMPARISON:  PET-CT dated September 19, 2018 FINDINGS: Cardiovascular: Evaluation  is limited by respiratory motion artifact. Given this limitation, there was no acute pulmonary embolism detected.The heart size is normal. There is no significant pericardial effusion. There is no evidence for thoracic aortic dissection or aneurysm. Mediastinum/Nodes: --No mediastinal or hilar lymphadenopathy. --No axillary lymphadenopathy. --No supraclavicular lymphadenopathy. --Normal thyroid gland. --The esophagus is unremarkable Lungs/Pleura: There are hazy bilateral ground-glass airspace opacities. There is some interlobular septal thickening. There are trace bilateral pleural effusions. There is atelectasis at the lung bases. There is no pneumothorax. The trachea is unremarkable. Upper Abdomen: Large cyst is again noted in the patient's left hepatic lobe. There is persistent splenomegaly. There is a heterogeneous appearance of the splenic parenchyma which is felt to be secondary to contrast timing in the absence of left upper quadrant abdominal pain. Musculoskeletal: No chest wall abnormality. No acute or significant osseous findings. Review of the MIP images confirms the above findings. IMPRESSION: 1. Evaluation for pulmonary emboli is limited by respiratory motion artifact. Given this limitation, no acute pulmonary embolism was detected. 2. There are diffuse bilateral hazy ground-glass airspace opacities which may represent an atypical infectious process or  pulmonary edema. 3. Splenomegaly. Electronically Signed   By: Constance Holster M.D.   On: 11/14/2019 01:36   DG CHEST PORT 1 VIEW  Result Date: 11/15/2019 CLINICAL DATA:  69 year old male with shortness of breath. EXAM: PORTABLE CHEST 1 VIEW COMPARISON:  CTA chest 11/14/2019 and earlier. FINDINGS: Portable AP semi upright view at 0520 hours. Continued indistinct/interstitial basilar predominant bilateral pulmonary opacity, stable from the CTA yesterday, mildly increased at the left base since 11/13/2019. Continued low lung volumes. Mediastinal contours remain normal. Visualized tracheal air column is within normal limits. No pneumothorax or pleural effusion. Paucity of bowel gas in the upper abdomen. No acute osseous abnormality identified. IMPRESSION: Continued low lung volumes with basilar predominant indistinct/interstitial opacity which could reflect a combination of atelectasis and infection versus edema. No pleural effusion. Electronically Signed   By: Genevie Ann M.D.   On: 11/15/2019 08:30   DG Chest Port 1 View  Result Date: 11/13/2019 CLINICAL DATA:  Initial evaluation for acute shortness of breath, weakness. EXAM: PORTABLE CHEST 1 VIEW COMPARISON:  Prior radiograph from 11/10/2019. FINDINGS: Exaggeration of the cardiac silhouette related AP technique and low lung volumes. Mediastinal silhouette normal. Lungs hypoinflated. Patchy and hazy opacity seen within the mid and lower lungs bilaterally, which could reflect atelectasis or possibly infiltrates. Underlying mild pulmonary interstitial congestion. No appreciable pleural effusion. No pneumothorax. No acute osseous finding. IMPRESSION: 1. Patchy and hazy opacity within the mid and lower lungs bilaterally, which could reflect atelectasis or possibly infiltrates. 2. Underlying mild pulmonary interstitial congestion. Electronically Signed   By: Jeannine Boga M.D.   On: 11/13/2019 21:36   ECHOCARDIOGRAM COMPLETE  Result Date: 11/14/2019     ECHOCARDIOGRAM REPORT   Patient Name:   Dean Dixon Date of Exam: 11/14/2019 Medical Rec #:  [id number]    Height:       64.0 in Accession #:    [id number]   Weight:       195.0 lb Date of Birth:  04-24-1951   BSA:          1.935 m Patient Age:    89 years     BP:           158/72 mmHg Patient Gender: M            HR:           90 bpm. Exam  Location:  Inpatient Procedure: 2D Echo, Cardiac Doppler and Color Doppler Indications:    Dyspnea 786.09 / R06.00  History:        Patient has no prior history of Echocardiogram examinations.                 Risk Factors:Hypertension, Diabetes and Non-Smoker.  Sonographer:    Vickie Epley RDCS Referring Phys: [id number] [place]  1. Left ventricular ejection fraction, by estimation, is 50 to 55%. The left ventricle has low normal function. The left ventricle has no regional wall motion abnormalities. Left ventricular diastolic parameters are consistent with Grade I diastolic dysfunction (impaired relaxation).  2. Right ventricular systolic function is normal. The right ventricular size is normal.  3. The mitral valve is normal in structure. No evidence of mitral valve regurgitation. No evidence of mitral stenosis.  4. The aortic valve is normal in structure. Aortic valve regurgitation is not visualized. No aortic stenosis is present. FINDINGS  Left Ventricle: Left ventricular ejection fraction, by estimation, is 50 to 55%. The left ventricle has low normal function. The left ventricle has no regional wall motion abnormalities. The left ventricular internal cavity size was normal in size. There is no left ventricular hypertrophy. Left ventricular diastolic parameters are consistent with Grade I diastolic dysfunction (impaired relaxation). Right Ventricle: The right ventricular size is normal. No increase in right ventricular wall thickness. Right ventricular systolic function is normal. Left Atrium: Left atrial size was normal in size. Right Atrium: Right atrial  size was normal in size. Pericardium: There is no evidence of pericardial effusion. Mitral Valve: The mitral valve is normal in structure. No evidence of mitral valve regurgitation. No evidence of mitral valve stenosis. Tricuspid Valve: The tricuspid valve is normal in structure. Tricuspid valve regurgitation is trivial. Aortic Valve: The aortic valve is normal in structure. Aortic valve regurgitation is not visualized. No aortic stenosis is present. Pulmonic Valve: The pulmonic valve was grossly normal. Pulmonic valve regurgitation is not visualized. Aorta: The aortic root and ascending aorta are structurally normal, with no evidence of dilitation. IAS/Shunts: The atrial septum is grossly normal.  LEFT VENTRICLE PLAX 2D LVIDd:         4.60 cm      Diastology LVIDs:         3.00 cm      LV e' lateral:   9.90 cm/s LV PW:         0.90 cm      LV E/e' lateral: 7.2 LV IVS:        0.90 cm      LV e' medial:    6.42 cm/s LVOT diam:     1.90 cm      LV E/e' medial:  11.1 LV SV:         58 LV SV Index:   30 LVOT Area:     2.84 cm  LV Volumes (MOD) LV vol d, MOD A2C: 101.0 ml LV vol d, MOD A4C: 88.6 ml LV vol s, MOD A2C: 41.0 ml LV vol s, MOD A4C: 41.8 ml LV SV MOD A2C:     60.0 ml LV SV MOD A4C:     88.6 ml LV SV MOD BP:      52.8 ml RIGHT VENTRICLE RV S prime:     17.70 cm/s TAPSE (M-mode): 2.7 cm LEFT ATRIUM             Index       RIGHT ATRIUM  Index LA diam:        3.40 cm 1.76 cm/m  RA Area:     9.02 cm LA Vol (A2C):   27.9 ml 14.42 ml/m RA Volume:   14.80 ml 7.65 ml/m LA Vol (A4C):   35.0 ml 18.08 ml/m LA Biplane Vol: 33.8 ml 17.46 ml/m  AORTIC VALVE LVOT Vmax:   118.00 cm/s LVOT Vmean:  76.800 cm/s LVOT VTI:    0.204 m  AORTA Ao Root diam: 3.30 cm MITRAL VALVE MV Area (PHT): 3.50 cm    SHUNTS MV Decel Time: 217 msec    Systemic VTI:  0.20 m MV E velocity: 71.10 cm/s  Systemic Diam: 1.90 cm MV A velocity: 78.80 cm/s MV E/A ratio:  0.90 Mertie Moores MD Electronically signed by Mertie Moores MD  Signature Date/Time: 11/14/2019/4:33:03 PM    Final     ASSESSMENT & PLAN:   69 yo with   1) Mantle cell lymphoma S/p 4 cycles of BR + 1 additional cycle of Rituxan  2) Symptomatic Anemia -- ? Likely related to chemotherapy  3) Acute hypoxic respiratory failure. Flu like illness with fevers. Fevers now resolved with b/l interstitial infiltrated and b/l ground glass opacities SARS-COV2 neg  Concern for ? Atypical pneumonia vs Rituxan related ILD or hypersensitivity pneumonitis.  4) Dm2 -- expect hyperglycemia in acute illness and from steroids  PLAN -transfuse PRBC prn for hgb <8 in the setting of acute respiratory failure and limited physiologic reserves.  Status post 1 unit PRBCs on 11/14/2019 with improvement of his hemoglobin.  No transfusion indicated today. -PJP PCR on expectorated sputum-ordered but still needs to be collected -Viral respiratory panel/droplet precaution -time course and presentation concern for Rituxan-ILD or Hypersensitivity pneumonitis and absence of overt infectious findings at this time -- will start on High dose steroids -- Solumedrol 125mg  IV q6h -will need management of hyperglycemia per hospitalist -would recommend pulmonary consultation and consideration of FOB if respiratory status not improving quickly and no clear explanation on workup. -oncology will continue to follow -no plan for addition chemo-immunotherapy at this time.   Mikey Bussing, DNP, AGPCNP-BC, AOCNP Mon/Tues/Thurs/Fri 7am-5pm; Off Wednesdays Cell: [phone]  ADDENDUM  .Patient was Personally and independently interviewed, examined and relevant elements of the history of present illness were reviewed in details and an assessment and plan was created. All elements of the patient's history of present illness , assessment and plan were discussed in details with Mikey Bussing, DNP, AGPCNP-BC, AOCNP. The above documentation reflects our combined findings assessment and  plan.  -Breathing and oxygenation much improved. O2 requirements down to 2L/min down from 5L/min. -Reduce solumedrol to 125mg  IV q8h -- will wean to q12h in 1-2 days if improving and then transition to PO steroids with slow taper. -Pulmonary input noted. -appreciate excellent hospitalist cares by Dr Alfredia Ferguson. -Might need to reconsider safety of Rituxan use in the future. -will need rpt PET/CT in about 5-6 weeks. -incentive spirometry  Sullivan Lone MD MS

## 2019-11-15 NOTE — Progress Notes (Signed)
PROGRESS NOTE    Dean Dixon  [id number] DOB: [birth year] DOA: 11/13/2019 PCP: Leonides Sake, MD   Brief Narrative:  Patient is a obese 69 year old Caucasian male with a past medical history significant for but not limited to mantle cell lymphoma, diabetes mellitus type 2, hypertension as well as other comorbidities who presents to the emergency room with a chief complaint of shortness of breath.  He has been progressively dyspneic for almost 2 weeks now and felt as if he is having fevers early in the course but has not been coughing much.  He is denying any leg swelling or tenderness but does state that he has had recent development of orthopnea.  He is scheduled for chemo therapy infusion on 11/10/2019 but is canceled due to his shortness of breath.  With his symptoms continuing to worsen and with him unable to perform any of his usual activities without significant dyspnea he came to the emergency room for further evaluation and was found to be saturating in the 80s on room air while at rest.  He is tachycardic and tachypneic with a stable blood pressure.  Chest x-ray showed patchy hazy opacities bilaterally.  CT of the chest was done and was negative for PE but was notable for diffuse bilateral hazy groundglass opacities that could reflect atypical infection or edema.  He was started on treatment with vancomycin, cefepime and azithromycin and was placed on 5 L of supplemental oxygen  **Interim History Medical Oncology was consulted and they gave the patient 1 unit of PRBCs.  They also started the patient on high-dose steroids with Solu-Medrol 1.5 mg every 6 hours and recommended pulmonary consultation.  Pulmonary is to evaluate later today.  His oxygen requirements are weaning.  Assessment & Plan:   Principal Problem:   Acute respiratory failure with hypoxia (HCC) Active Problems:   HTN (hypertension)   Mantle cell lymphoma of lymph nodes of multiple regions (HCC)   Pneumonia  Diabetes mellitus without complication (HCC)   Bilateral pulmonary infiltrates on CXR  Acute Hypoxic Respiratory Failure -Presents with close to 2 weeks of SOB, had subjective fevers early in the course, no significant cough, no swelling but reports recent orthopnea, and is found to be saturating low 80s on rm air with CTA chest findings concerning for atypical PNA or edema  -Has a Broad Differential with Hypersensitivity Pneumonitis, PJP and other opportunistic infections, Atypical PNA, CAP, etc.  - COVID pcr is negative -Blood cultures were collected in ED and he was treated with supplemental O2, vancomycin, cefepime, and Azithromycinand this was changed to po today  -Continue Supplemental O2 via [place] and wean O2 as tolerated  -Continuous Pulse Oximetry and Maintain O2 Sats >92% -SpO2: 94 % O2 Flow Rate (L/min): 3 L/min  -VBG done and showed a pH of 7.453, PCO2 of 31.3, PO2 of 52.1, bicarbonate level 21.6, and ABG O2 saturation of 86.3% -Check sputum culture and strep pneumo and legionella antigens,  -Check echocardiogram, and continue empiric antibiotics and suppleental O2 -Medical oncology gave the patient 1 unit of blood and does not know if the symptomatic anemia and shortness breath is related to his chemotherapy -Read is for an atypical pneumonia versus Rituxan related to interstitial lung disease or hypersensitivity pneumonitis so Dr. Wilber Bihari started the patient high-dose steroids of Solu-Medrol 125 IV every 6 and have asked for pulmonary consultation I have called pulmonary today and awaiting further recommendations -Medical oncology also recommending obtaining a PJP PCR unexpected sputum and obtain a viral respiratory  panel and continue with droplet precautions -Echocardiogram showed an EF of 50 to 55% and grade 1 diastolic dysfunction -Oxygen requirements are weaning slowly -LDH was 224 and a CRP was 3.2 -May need some diuresis given his diastolic dysfunction -Will need an  Ambulatory Home O2 screen prior to D/C   Mantle Cell Lymphoma -Undergoing chemotherapy, missed most recent infusion due to the current respiratory illness -His oncology team will be notified of the admission; Sees Dr. Irene Limbo  -Dr. Lenon Curt came by and appreciate his evaluation and he recommends for no plan for additional chemo immunotherapy at this time -See above -Continue supportive care and continue with pain control with hydrocodone-acetaminophen 1-2 tabs p.o. every 4 hours as needed for moderate pain continue with bowel regimen  Type II DMwith hyperglycemia -HbA1c this visit shows that it is 5.4, serum glucose 197 in ED -Check CBGs, use SSI with Novolog for now -CBG this AM was 315 and his CBGs have been ranging from 130-389 -We will adjust his insulin and added Levemir 10 units nightly as well as 3 units of NovoLog sliding scale 3 times daily with meals -Diabetes education coronary was consulted for further evaluation recommendations  Hypertension -BP at Goal blood pressure this morning was 117/73 -Continue Ramipril p.o. daily and Amlodipine 10 mg p.o. daily as tolerated  Normocytic Anemia -Counts appear stable but have dropped from admission as Hgb/Hct went from 8.8/27.7 -> 7.7/24.6 -> and is now 9.7/31.8 after 1 unit of PRBCs -Patient denies bleedingbut is transfused 1 unit of PRBC by medical oncology -We will not check anemia Panel in the AM now that he has received 1 unit of blood -Continue to Monitor for S/Sx of Bleeding; Currently no overt bleeding noted -Repeat CBC in the AM  Metabolic Acidosis -Mild with a CO2 of 20, AG of 10, and Chloride Level of 106 yesterday and today he has a CO2 of 20, and anion gap of 10, and a chloride level of 108 -Continue to Monitor and Trend -Repeat CMP in AM    Thrombocytopenia -Mild. Patient's Platelet Count went from 162 -> 133 -> 137 -Continue to Monitor for S/Sx of Bleeding -Repeat CBC in the AM   Diarrhea Plan continue  with loperamide 2 mg p.o. as needed for diarrhea or loose stools  GERD -Continue with pantoprazole 40 mg p.o. daily  Hyperphosphatemia -Mild with a phosphorus level 4.7 -Continue to monitor and trend -Repeat phosphorus level in a.m.  Obesity -Estimated body mass index is 34.47 kg/m as calculated from the following:   Height as of this encounter: 5\' 4"  (1.626 m).   Weight as of this encounter: 91.1 kg. -Weight Loss and Dietary Counseling   DVT prophylaxis: Enoxaparin 40 mg sq q24h Code Status: FULL CODE  Family Communication: No family present at bedside  Disposition Plan: Pending improvement in Respiratory Status and Clearance by Pulmonary and Oncology  Status is: Inpatient  Remains inpatient appropriate because:Unsafe d/c plan, IV treatments appropriate due to intensity of illness or inability to take PO and Inpatient level of care appropriate due to severity of illness   Dispo: The patient is from: Home              Anticipated d/c is to: Home              Anticipated d/c date is: 2 days              Patient currently is not medically stable to d/c.  Consultants:   Medical Oncology  Pulmonary    Procedures:  ECHOCARDIOGRAM IMPRESSIONS    1. Left ventricular ejection fraction, by estimation, is 50 to 55%. The  left ventricle has low normal function. The left ventricle has no regional  wall motion abnormalities. Left ventricular diastolic parameters are  consistent with Grade I diastolic  dysfunction (impaired relaxation).  2. Right ventricular systolic function is normal. The right ventricular  size is normal.  3. The mitral valve is normal in structure. No evidence of mitral valve  regurgitation. No evidence of mitral stenosis.  4. The aortic valve is normal in structure. Aortic valve regurgitation is  not visualized. No aortic stenosis is present.   FINDINGS  Left Ventricle: Left ventricular ejection fraction, by estimation, is 50  to 55%. The left  ventricle has low normal function. The left ventricle has  no regional wall motion abnormalities. The left ventricular internal  cavity size was normal in size.  There is no left ventricular hypertrophy. Left ventricular diastolic  parameters are consistent with Grade I diastolic dysfunction (impaired  relaxation).   Right Ventricle: The right ventricular size is normal. No increase in  right ventricular wall thickness. Right ventricular systolic function is  normal.   Left Atrium: Left atrial size was normal in size.   Right Atrium: Right atrial size was normal in size.   Pericardium: There is no evidence of pericardial effusion.   Mitral Valve: The mitral valve is normal in structure. No evidence of  mitral valve regurgitation. No evidence of mitral valve stenosis.   Tricuspid Valve: The tricuspid valve is normal in structure. Tricuspid  valve regurgitation is trivial.   Aortic Valve: The aortic valve is normal in structure. Aortic valve  regurgitation is not visualized. No aortic stenosis is present.   Pulmonic Valve: The pulmonic valve was grossly normal. Pulmonic valve  regurgitation is not visualized.   Aorta: The aortic root and ascending aorta are structurally normal, with  no evidence of dilitation.   IAS/Shunts: The atrial septum is grossly normal.     LEFT VENTRICLE  PLAX 2D  LVIDd:     4.60 cm   Diastology  LVIDs:     3.00 cm   LV e' lateral:  9.90 cm/s  LV PW:     0.90 cm   LV E/e' lateral: 7.2  LV IVS:    0.90 cm   LV e' medial:  6.42 cm/s  LVOT diam:   1.90 cm   LV E/e' medial: 11.1  LV SV:     58  LV SV Index:  30  LVOT Area:   2.84 cm    LV Volumes (MOD)  LV vol d, MOD A2C: 101.0 ml  LV vol d, MOD A4C: 88.6 ml  LV vol s, MOD A2C: 41.0 ml  LV vol s, MOD A4C: 41.8 ml  LV SV MOD A2C:   60.0 ml  LV SV MOD A4C:   88.6 ml  LV SV MOD BP:   52.8 ml   RIGHT VENTRICLE  RV S prime:   17.70 cm/s    TAPSE (M-mode): 2.7 cm   LEFT ATRIUM       Index    RIGHT ATRIUM     Index  LA diam:    3.40 cm 1.76 cm/m RA Area:   9.02 cm  LA Vol (A2C):  27.9 ml 14.42 ml/m RA Volume:  14.80 ml 7.65 ml/m  LA Vol (A4C):  35.0 ml 18.08 ml/m  LA Biplane Vol: 33.8 ml 17.46 ml/m  AORTIC VALVE  LVOT Vmax:  118.00 cm/s  LVOT Vmean: 76.800 cm/s  LVOT VTI:  0.204 m    AORTA  Ao Root diam: 3.30 cm   MITRAL VALVE  MV Area (PHT): 3.50 cm  SHUNTS  MV Decel Time: 217 msec  Systemic VTI: 0.20 m  MV E velocity: 71.10 cm/s Systemic Diam: 1.90 cm  MV A velocity: 78.80 cm/s  MV E/A ratio: 0.90   Antimicrobials:  Anti-infectives (From admission, onward)   Start     Dose/Rate Route Frequency Ordered Stop   11/15/19 2200  azithromycin (ZITHROMAX) tablet 500 mg     500 mg Oral Daily at bedtime 11/15/19 1124     11/15/19 0400  azithromycin (ZITHROMAX) 500 mg in sodium chloride 0.9 % 250 mL IVPB  Status:  Discontinued     500 mg 250 mL/hr over 60 Minutes Intravenous Every 24 hours 11/14/19 0323 11/15/19 1124   11/14/19 1800  vancomycin (VANCOREADY) IVPB 750 mg/150 mL  Status:  Discontinued     750 mg 150 mL/hr over 60 Minutes Intravenous Every 12 hours 11/14/19 0605 11/14/19 1218   11/14/19 1200  ceFEPIme (MAXIPIME) 2 g in sodium chloride 0.9 % 100 mL IVPB     2 g 200 mL/hr over 30 Minutes Intravenous Every 8 hours 11/14/19 0605     11/14/19 0200  vancomycin (VANCOREADY) IVPB 1500 mg/300 mL     1,500 mg 150 mL/hr over 120 Minutes Intravenous  Once 11/14/19 0148 11/14/19 0608   11/14/19 0145  vancomycin (VANCOCIN) IVPB 1000 mg/200 mL premix  Status:  Discontinued     1,000 mg 200 mL/hr over 60 Minutes Intravenous  Once 11/14/19 0144 11/14/19 0147   11/14/19 0145  ceFEPIme (MAXIPIME) 2 g in sodium chloride 0.9 % 100 mL IVPB     2 g 200 mL/hr over 30 Minutes Intravenous  Once 11/14/19 0144 11/14/19 0239   11/14/19 0145  azithromycin (ZITHROMAX) 500 mg in sodium  chloride 0.9 % 250 mL IVPB     500 mg 250 mL/hr over 60 Minutes Intravenous  Once 11/14/19 0144 11/14/19 0344     Subjective: Seen and examined at bedside he states that he is feeling much better today than he was yesterday.  Still not back to his baseline states that he becomes very short of breath when ambulating.  No nausea or vomiting.  Denies any lightheadedness or dizziness.  Feels okay.  No other concerns or complaints at this time and is not coughing up much.  Objective: Vitals:   11/15/19 0258 11/15/19 0428 11/15/19 0500 11/15/19 0530  BP: 117/72 117/73    Pulse: 69 71    Resp: 20 19    Temp: 97.6 F (36.4 C) 97.7 F (36.5 C)    TempSrc:  Oral    SpO2: 94% 95%  94%  Weight:   91.1 kg   Height:        Intake/Output Summary (Last 24 hours) at 11/15/2019 1449 Last data filed at 11/15/2019 1030 Gross per 24 hour  Intake 1238.63 ml  Output 875 ml  Net 363.63 ml   Filed Weights   11/13/19 2131 11/15/19 0500  Weight: 88.5 kg 91.1 kg   Examination: Physical Exam:  Constitutional: WN/WD obese Caucasian male currently in no acute distress appears calm Eyes: Lids and conjunctivae normal, sclerae anicteric  ENMT: External Ears, Nose appear normal. Grossly normal hearing. Neck: Appears normal, supple, no cervical masses, normal ROM, no appreciable thyromegaly; no JVD Respiratory: Diminished to auscultation  bilaterally with coarse breath sounds and some crackles in rhonchi.  Unlabored breathing but he is wearing supplemental oxygen via nasal cannula Cardiovascular: RRR, no murmurs / rubs / gallops. S1 and S2 auscultated.  Trace extremity edema.  Abdomen: Soft, non-tender, Distended due to body habitus.  Bowel sounds positive.  GU: Deferred. Musculoskeletal: No clubbing / cyanosis of digits/nails. No joint deformity upper and lower extremities.  Skin: No rashes, lesions, ulcers on a limited skin. No induration; Warm and dry.  Neurologic: CN 2-12 grossly intact with no focal  deficits. Romberg sign and cerebellar reflexes not assessed.  Psychiatric: Normal judgment and insight. Alert and oriented x 3. Normal mood and appropriate affect.   Data Reviewed: I have personally reviewed following labs and imaging studies  CBC: Recent Labs  Lab 11/10/19 0827 11/13/19 2144 11/13/19 2155 11/14/19 0613 11/15/19 0609  WBC 5.2 5.5  --  3.8* 3.5*  NEUTROABS 3.7 4.3  --  3.0 3.0  HGB 8.9* 8.8* 8.8* 7.7* 9.7*  HCT 27.5* 27.7* 26.0* 24.6* 31.8*  MCV 88.7 91.7  --  92.5 92.4  PLT 172 162  --  133* 0000000*   Basic Metabolic Panel: Recent Labs  Lab 11/10/19 0827 11/13/19 2144 11/13/19 2155 11/14/19 0613 11/15/19 0609  NA 136 137 137 136 138  K 3.8 3.6 3.6 4.1 4.5  CL 103 106 105 106 108  CO2 21* 20*  --  20* 20*  GLUCOSE 208* 197* 190* 244* 315*  BUN 15 14 12 14 15   CREATININE 0.95 0.89 0.80 0.78 0.73  CALCIUM 8.7* 8.3*  --  7.8* 8.5*  MG  --   --   --   --  2.3  PHOS  --   --   --   --  4.7*   GFR: Estimated Creatinine Clearance: 87.5 mL/min (by C-G formula based on SCr of 0.73 mg/dL). Liver Function Tests: Recent Labs  Lab 11/10/19 0827 11/13/19 2144 11/15/19 0609  AST 18 17 15   ALT 16 15 14   ALKPHOS 71 59 55  BILITOT 0.8 0.8 0.9  PROT 5.9* 5.8* 5.6*  ALBUMIN 3.1* 3.3* 3.0*   No results for input(s): LIPASE, AMYLASE in the last 168 hours. No results for input(s): AMMONIA in the last 168 hours. Coagulation Profile: No results for input(s): INR, PROTIME in the last 168 hours. Cardiac Enzymes: No results for input(s): CKTOTAL, CKMB, CKMBINDEX, TROPONINI in the last 168 hours. BNP (last 3 results) No results for input(s): PROBNP in the last 8760 hours. HbA1C: Recent Labs    11/14/19 0613  HGBA1C 5.4   CBG: Recent Labs  Lab 11/14/19 1259 11/14/19 1646 11/14/19 2120 11/15/19 0728 11/15/19 1159  GLUCAP 136* 138* 239* 317* 389*   Lipid Profile: No results for input(s): CHOL, HDL, LDLCALC, TRIG, CHOLHDL, LDLDIRECT in the last 72  hours. Thyroid Function Tests: No results for input(s): TSH, T4TOTAL, FREET4, T3FREE, THYROIDAB in the last 72 hours. Anemia Panel: No results for input(s): VITAMINB12, FOLATE, FERRITIN, TIBC, IRON, RETICCTPCT in the last 72 hours. Sepsis Labs: Recent Labs  Lab 11/10/19 1257 11/13/19 2159 11/14/19 0012  PROCALCITON <0.10  --   --   LATICACIDVEN  --  1.2 1.0    Recent Results (from the past 240 hour(s))  SARS Coronavirus 2 by RT PCR (hospital order, performed in [facility] hospital lab) Nasopharyngeal Nasopharyngeal Swab     Status: None   Collection Time: 11/13/19  9:30 PM   Specimen: Nasopharyngeal Swab  Result Value Ref Range Status  SARS Coronavirus 2 NEGATIVE NEGATIVE Final    Comment: (NOTE) SARS-CoV-2 target nucleic acids are NOT DETECTED. The SARS-CoV-2 RNA is generally detectable in upper and lower respiratory specimens during the acute phase of infection. The lowest concentration of SARS-CoV-2 viral copies this assay can detect is 250 copies / mL. A negative result does not preclude SARS-CoV-2 infection and should not be used as the sole basis for treatment or other patient management decisions.  A negative result may occur with improper specimen collection / handling, submission of specimen other than nasopharyngeal swab, presence of viral mutation(s) within the areas targeted by this assay, and inadequate number of viral copies (<250 copies / mL). A negative result must be combined with clinical observations, patient history, and epidemiological information. Fact Sheet for Patients:   StrictlyIdeas.no Fact Sheet for Healthcare Providers: BankingDealers.co.za This test is not yet approved or cleared  by the [place] FDA and has been authorized for detection and/or diagnosis of SARS-CoV-2 by FDA under an Emergency Use Authorization (EUA).  This EUA will remain in effect (meaning this test can be used) for the  duration of the COVID-19 declaration under Section 564(b)(1) of the Act, 21 U.S.C. section 360bbb-3(b)(1), unless the authorization is terminated or revoked sooner. Performed at [facility], [place] [address], [place], [place] [address]   Blood culture (routine x 2)     Status: None (Preliminary result)   Collection Time: 11/13/19  9:59 PM   Specimen: BLOOD  Result Value Ref Range Status   Specimen Description   Final    BLOOD RIGHT ANTECUBITAL Performed at [facility] [address] [place], [place] [address]    Special Requests   Final    BOTTLES DRAWN AEROBIC AND ANAEROBIC Blood Culture adequate volume Performed at [facility] [address], [place], [place] [address]    Culture   Final    NO GROWTH 1 DAY Performed at [facility], [place] [address], [place], [place] [address]    Report Status PENDING  Incomplete  Blood culture (routine x 2)     Status: None (Preliminary result)   Collection Time: 11/14/19 12:12 AM   Specimen: BLOOD RIGHT HAND  Result Value Ref Range Status   Specimen Description   Final    BLOOD RIGHT HAND Performed at [facility] [address], [place], [place] [address]    Special Requests   Final    BOTTLES DRAWN AEROBIC AND ANAEROBIC Blood Culture results may not be optimal due to an inadequate volume of blood received in culture bottles Performed at [facility] [address], [place], [place] [address]    Culture   Final    NO GROWTH 1 DAY Performed at [facility], [place] [address] [place], [place] [address]    Report Status PENDING  Incomplete  MRSA PCR Screening     Status: None   Collection Time: 11/14/19  9:31 AM   Specimen: Nasal Mucosa; Nasopharyngeal  Result Value Ref Range Status   MRSA by PCR NEGATIVE NEGATIVE Final    Comment:        The GeneXpert MRSA Assay (FDA approved for NASAL specimens only), is one component of  a comprehensive MRSA colonization surveillance program. It is not intended to diagnose MRSA infection nor to guide or monitor treatment for MRSA infections. Performed at [facility], [place] [address] [place],  [address]   Respiratory Panel by  PCR     Status: None   Collection Time: 11/14/19  8:30 PM   Specimen: Nasopharyngeal Swab; Respiratory  Result Value Ref Range Status   Adenovirus NOT DETECTED NOT DETECTED Final   Coronavirus 229E NOT DETECTED NOT DETECTED Final    Comment: (NOTE) The Coronavirus on the Respiratory Panel, DOES NOT test for the novel  Coronavirus (2019 nCoV)    Coronavirus HKU1 NOT DETECTED NOT DETECTED Final   Coronavirus NL63 NOT DETECTED NOT DETECTED Final   Coronavirus OC43 NOT DETECTED NOT DETECTED Final   Metapneumovirus NOT DETECTED NOT DETECTED Final   Rhinovirus / Enterovirus NOT DETECTED NOT DETECTED Final   Influenza A NOT DETECTED NOT DETECTED Final   Influenza B NOT DETECTED NOT DETECTED Final   Parainfluenza Virus 1 NOT DETECTED NOT DETECTED Final   Parainfluenza Virus 2 NOT DETECTED NOT DETECTED Final   Parainfluenza Virus 3 NOT DETECTED NOT DETECTED Final   Parainfluenza Virus 4 NOT DETECTED NOT DETECTED Final   Respiratory Syncytial Virus NOT DETECTED NOT DETECTED Final   Bordetella pertussis NOT DETECTED NOT DETECTED Final   Chlamydophila pneumoniae NOT DETECTED NOT DETECTED Final   Mycoplasma pneumoniae NOT DETECTED NOT DETECTED Final    Comment: Performed at [facility], [place]. [address] [place], [place] [address]    RN Pressure Injury Documentation:     Estimated body mass index is 34.47 kg/m as calculated from the following:   Height as of this encounter: 5\' 4"  (1.626 m).   Weight as of this encounter: 91.1 kg.  Malnutrition Type:  Nutrition Problem: Increased nutrient needs Etiology: cancer and cancer related treatments   Malnutrition Characteristics:  Signs/Symptoms: estimated  needs   Nutrition Interventions:  Interventions: MVI, Ensure Enlive (each supplement provides 350kcal and 20 grams of protein)   Radiology Studies: CT Angio Chest PE W and/or Wo Contrast  Result Date: 11/14/2019 CLINICAL DATA:  Shortness of breath. EXAM: CT ANGIOGRAPHY CHEST WITH CONTRAST TECHNIQUE: Multidetector CT imaging of the chest was performed using the standard protocol during bolus administration of intravenous contrast. Multiplanar CT image reconstructions and MIPs were obtained to evaluate the vascular anatomy. CONTRAST:  114mL OMNIPAQUE IOHEXOL 350 MG/ML SOLN COMPARISON:  PET-CT dated September 19, 2018 FINDINGS: Cardiovascular: Evaluation is limited by respiratory motion artifact. Given this limitation, there was no acute pulmonary embolism detected.The heart size is normal. There is no significant pericardial effusion. There is no evidence for thoracic aortic dissection or aneurysm. Mediastinum/Nodes: --No mediastinal or hilar lymphadenopathy. --No axillary lymphadenopathy. --No supraclavicular lymphadenopathy. --Normal thyroid gland. --The esophagus is unremarkable Lungs/Pleura: There are hazy bilateral ground-glass airspace opacities. There is some interlobular septal thickening. There are trace bilateral pleural effusions. There is atelectasis at the lung bases. There is no pneumothorax. The trachea is unremarkable. Upper Abdomen: Large cyst is again noted in the patient's left hepatic lobe. There is persistent splenomegaly. There is a heterogeneous appearance of the splenic parenchyma which is felt to be secondary to contrast timing in the absence of left upper quadrant abdominal pain. Musculoskeletal: No chest wall abnormality. No acute or significant osseous findings. Review of the MIP images confirms the above findings. IMPRESSION: 1. Evaluation for pulmonary emboli is limited by respiratory motion artifact. Given this limitation, no acute pulmonary embolism was detected. 2. There are  diffuse bilateral hazy ground-glass airspace opacities which may represent an atypical infectious process or pulmonary edema. 3. Splenomegaly. Electronically Signed   By: Constance Holster M.D.   On: 11/14/2019 01:36   DG  CHEST PORT 1 VIEW  Result Date: 11/15/2019 CLINICAL DATA:  69 year old male with shortness of breath. EXAM: PORTABLE CHEST 1 VIEW COMPARISON:  CTA chest 11/14/2019 and earlier. FINDINGS: Portable AP semi upright view at 0520 hours. Continued indistinct/interstitial basilar predominant bilateral pulmonary opacity, stable from the CTA yesterday, mildly increased at the left base since 11/13/2019. Continued low lung volumes. Mediastinal contours remain normal. Visualized tracheal air column is within normal limits. No pneumothorax or pleural effusion. Paucity of bowel gas in the upper abdomen. No acute osseous abnormality identified. IMPRESSION: Continued low lung volumes with basilar predominant indistinct/interstitial opacity which could reflect a combination of atelectasis and infection versus edema. No pleural effusion. Electronically Signed   By: Genevie Ann M.D.   On: 11/15/2019 08:30   DG Chest Port 1 View  Result Date: 11/13/2019 CLINICAL DATA:  Initial evaluation for acute shortness of breath, weakness. EXAM: PORTABLE CHEST 1 VIEW COMPARISON:  Prior radiograph from 11/10/2019. FINDINGS: Exaggeration of the cardiac silhouette related AP technique and low lung volumes. Mediastinal silhouette normal. Lungs hypoinflated. Patchy and hazy opacity seen within the mid and lower lungs bilaterally, which could reflect atelectasis or possibly infiltrates. Underlying mild pulmonary interstitial congestion. No appreciable pleural effusion. No pneumothorax. No acute osseous finding. IMPRESSION: 1. Patchy and hazy opacity within the mid and lower lungs bilaterally, which could reflect atelectasis or possibly infiltrates. 2. Underlying mild pulmonary interstitial congestion. Electronically Signed    By: Jeannine Boga M.D.   On: 11/13/2019 21:36   ECHOCARDIOGRAM COMPLETE  Result Date: 11/14/2019    ECHOCARDIOGRAM REPORT   Patient Name:   RANULFO GEDDIE Date of Exam: 11/14/2019 Medical Rec #:  [id number]    Height:       64.0 in Accession #:    [id number]   Weight:       195.0 lb Date of Birth:  [birth year]   BSA:          1.935 m Patient Age:    50 years     BP:           158/72 mmHg Patient Gender: M            HR:           90 bpm. Exam Location:  Inpatient Procedure: 2D Echo, Cardiac Doppler and Color Doppler Indications:    Dyspnea 786.09 / R06.00  History:        Patient has no prior history of Echocardiogram examinations.                 Risk Factors:Hypertension, Diabetes and Non-Smoker.  Sonographer:    Vickie Epley RDCS Referring Phys: [id number] [place]  1. Left ventricular ejection fraction, by estimation, is 50 to 55%. The left ventricle has low normal function. The left ventricle has no regional wall motion abnormalities. Left ventricular diastolic parameters are consistent with Grade I diastolic dysfunction (impaired relaxation).  2. Right ventricular systolic function is normal. The right ventricular size is normal.  3. The mitral valve is normal in structure. No evidence of mitral valve regurgitation. No evidence of mitral stenosis.  4. The aortic valve is normal in structure. Aortic valve regurgitation is not visualized. No aortic stenosis is present. FINDINGS  Left Ventricle: Left ventricular ejection fraction, by estimation, is 50 to 55%. The left ventricle has low normal function. The left ventricle has no regional wall motion abnormalities. The left ventricular internal cavity size was normal in size. There is no left ventricular hypertrophy. Left  ventricular diastolic parameters are consistent with Grade I diastolic dysfunction (impaired relaxation). Right Ventricle: The right ventricular size is normal. No increase in right ventricular wall thickness. Right  ventricular systolic function is normal. Left Atrium: Left atrial size was normal in size. Right Atrium: Right atrial size was normal in size. Pericardium: There is no evidence of pericardial effusion. Mitral Valve: The mitral valve is normal in structure. No evidence of mitral valve regurgitation. No evidence of mitral valve stenosis. Tricuspid Valve: The tricuspid valve is normal in structure. Tricuspid valve regurgitation is trivial. Aortic Valve: The aortic valve is normal in structure. Aortic valve regurgitation is not visualized. No aortic stenosis is present. Pulmonic Valve: The pulmonic valve was grossly normal. Pulmonic valve regurgitation is not visualized. Aorta: The aortic root and ascending aorta are structurally normal, with no evidence of dilitation. IAS/Shunts: The atrial septum is grossly normal.  LEFT VENTRICLE PLAX 2D LVIDd:         4.60 cm      Diastology LVIDs:         3.00 cm      LV e' lateral:   9.90 cm/s LV PW:         0.90 cm      LV E/e' lateral: 7.2 LV IVS:        0.90 cm      LV e' medial:    6.42 cm/s LVOT diam:     1.90 cm      LV E/e' medial:  11.1 LV SV:         58 LV SV Index:   30 LVOT Area:     2.84 cm  LV Volumes (MOD) LV vol d, MOD A2C: 101.0 ml LV vol d, MOD A4C: 88.6 ml LV vol s, MOD A2C: 41.0 ml LV vol s, MOD A4C: 41.8 ml LV SV MOD A2C:     60.0 ml LV SV MOD A4C:     88.6 ml LV SV MOD BP:      52.8 ml RIGHT VENTRICLE RV S prime:     17.70 cm/s TAPSE (M-mode): 2.7 cm LEFT ATRIUM             Index       RIGHT ATRIUM          Index LA diam:        3.40 cm 1.76 cm/m  RA Area:     9.02 cm LA Vol (A2C):   27.9 ml 14.42 ml/m RA Volume:   14.80 ml 7.65 ml/m LA Vol (A4C):   35.0 ml 18.08 ml/m LA Biplane Vol: 33.8 ml 17.46 ml/m  AORTIC VALVE LVOT Vmax:   118.00 cm/s LVOT Vmean:  76.800 cm/s LVOT VTI:    0.204 m  AORTA Ao Root diam: 3.30 cm MITRAL VALVE MV Area (PHT): 3.50 cm    SHUNTS MV Decel Time: 217 msec    Systemic VTI:  0.20 m MV E velocity: 71.10 cm/s  Systemic Diam:  1.90 cm MV A velocity: 78.80 cm/s MV E/A ratio:  0.90 Mertie Moores MD Electronically signed by Mertie Moores MD Signature Date/Time: 11/14/2019/4:33:03 PM    Final    Scheduled Meds: . amLODipine  10 mg Oral Daily  . azithromycin  500 mg Oral QHS  . enoxaparin (LOVENOX) injection  40 mg Subcutaneous Q24H  . feeding supplement (ENSURE ENLIVE)  237 mL Oral BID BM  . insulin aspart  0-5 Units Subcutaneous QHS  . insulin aspart  0-9 Units Subcutaneous TID  WC  . insulin aspart  3 Units Subcutaneous TID WC  . insulin detemir  10 Units Subcutaneous QHS  . methylPREDNISolone (SOLU-MEDROL) injection  125 mg Intravenous Q6H  . multivitamin with minerals  1 tablet Oral Daily  . ramipril  10 mg Oral Daily  . sodium chloride flush  3 mL Intravenous Q12H   Continuous Infusions: . sodium chloride    . ceFEPime (MAXIPIME) IV 2 g (11/15/19 1308)    LOS: 1 day   Kerney Elbe, DO [facility] PAGER is on [place]  If 7PM-7AM, please contact night-coverage [email or web address]

## 2019-11-15 NOTE — Progress Notes (Addendum)
PHARMACY NOTE -  cefepime  Pharmacy has been assisting with dosing of cefepime for PNA.  Dosage remains stable at 2g IV q8 hr and need for further dosage adjustment appears unlikely at present given renal function at baseline  Pharmacy will sign off, following peripherally for culture results or dose adjustments. Please reconsult if a change in clinical status warrants re-evaluation of dosage.  Pharmacy IV to PO conversion  This patient is receiving azithromycin by the intravenous route. Based on criteria approved by the Pharmacy and Therapeutics Committee, and the Infectious Disease Division, the antibiotic(s) is/are being converted to equivalent oral dose form(s). These criteria include:   Patient being treated for a respiratory tract infection, urinary tract infection, cellulitis, or Clostridium Difficile Associated Diarrhea  The patient is not neutropenic and does not exhibit a GI malabsorption state  The patient is eating (either orally or per tube) and/or has been taking other orally administered medications for at least 24 hours.  The patient is improving clinically (physician assessment and a 24-hour Tmax of <=100.5 F)  If you have any questions about this conversion, please contact the Pharmacy Department (ext [phone]).  Thank you.  Reuel Boom, PharmD, BCPS [phone] 11/15/2019, 11:23 AM

## 2019-11-15 NOTE — Consult Note (Signed)
NAME:  Dean Dixon, MRN:  [id number], DOB:  [birth year], LOS: 1 ADMISSION DATE:  11/13/2019, CONSULTATION DATE:  11/15/19 REFERRING MD:  Dr. Alfredia Ferguson, CHIEF COMPLAINT:  Abnormal CT Chest    Brief History   69 y/o M with Mantle Cell Lymphoma admitted on 5/23 with 2 week history of shortness of breath.  CTA of the chest negative for PE but showed diffuse bilateral ground glass opacities.    History of present illness   69 y/o M, with Mantle Cell Lymphoma (diagnosed in 05/2019) who presented 5/23 to [facility] with reports of shortness of breath.    The patient reported on presentation that he had been feeling more short of breath for two weeks.   He had fevers for several days around the week of 4/28 but resolved with aspirin.  He was planned for chemotherapy on 5/20 but missed due to dyspnea.  His symptoms continued to worsen prompting ER evaluation on 5/23.  On initial ER assessment he was noted to have room air saturations of 81%, tachycardia and tachypnea.  Initial CXR was concerning for patchy bilateral mid and lower opacities, mild interstitial congestion. COVID testing was negative.  To further evaluate hypoxemia a CTA of the chest was completed which was negative for pulmonary embolism, motion degraded, diffuse bilateral hazy ground-glass airspace opacities and splenomegaly noted.  The patient was admitted for further evaluation.  He was treated empirically with azithromycin and cefepime for possible pulmonary infection.  He continued to require 5L O2 to maintain saturations.  ECHO was assessed which showed normal LV function and grade I diastolic dysfunction. The patient was seen by Oncology and given 1 unit PRBC for Hgb of 7.7.  He was started on high dose steroids (113m solumedrol Q6) as well.  O2 weaned to 2L.    PCCM consulted for evaluation of bilateral ground glass opacities.   He reports he has a pPsychologist, occupational cats and fish.  Denies difficulty swallowing / choking on foods.  Notes he has lost 40  lbs since his diagnosis.  [place].  [place] has [place] all [place] [facility]  He is a lifelong never smoker / no vaping. 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  He is treated by Dr. KIrene Limbowith Bendamustine + Rituxan.  He notes he has significant rigors with rituxan administration requiring administration of demerol.    Past Medical History  Mantle Cell Lymphoma  HTN DM II   Significant Hospital Events   5/23 Admit  5/25 PCCM consulted   Consults:  ONC PCCM   Procedures:    Significant Diagnostic Tests:   CTA Chest 5/24 >> motion degraded, diffuse bilateral hazy ground-glass airspace opacities and splenomegaly   ECHO 5/24 >> LVEF 50-55%, LV has low normal function, no RWMA, grade I diastolic dysfunction, RV systolic function normal, LA/RA normal in size  Micro Data:  COVID 5/23 >> negative  MRSA PCR 5/24 >> negative  RVP 5/24 >> negative  BCx2 5/23 >>   Antimicrobials:  Vancomycin 5/24 x1  Azithro 5/24 >>  Cefepime 5/24 >>  Interim history/subjective:  Pt reports feeling better overall since admit. States he waited too  long to come into the hospital.   Objective   Blood pressure 117/73, pulse 71, temperature 97.7 F (36.5 C), temperature source Oral, resp. rate 19, height 5' 4" (1.626 m), weight 91.1 kg, SpO2 94 %.        Intake/Output Summary (Last 24 hours) at 11/15/2019 1411 Last data filed at 11/15/2019 1030 Gross per 24 hour  Intake 1238.63 ml  Output 875 ml  Net 363.63 ml   Filed Weights   11/13/19 2131 11/15/19 0500  Weight: 88.5 kg 91.1 kg    Examination: General: elderly male sitting in bed in NAD HEENT: MM pink/moist, fair dentition,  anicteric  Neuro: AAOx4, speech clear, MAE CV: s1s2 rrr, no m/r/g PULM:  Non-labored on 2L, lungs with posterior bibasilar crackles  GI: soft, bsx4 active  Extremities: warm/dry, no edema  Skin: no rashes or lesions  Resolved Hospital Problem list      Assessment & Plan:   Acute Hypoxemic Respiratory Failure in setting of Diffuse Bilateral GGO  Diffuse bilateral ground glass opacities on CT imaging.  Patient has Mantle Cell Lymphoma with initial diagnosis in 05/2019 on bendamustine + rituximab.  He missed his last dose on 5/20 due to dyspnea.  Differential diagnosis includes CAP, opportunistic infection such as PJP, BOOP/COP, and hypersensitivity pneumonitis with rituximab (~18% pulmonary toxicity risk) and pet birds.  Less likely aspiration as no subjective hx to support. He has improved on steroids and antibiotics.  Strep antigen negative. Home medications reviewed and no offending agents.    -continue abx for minimum of 7 days -continue steroids with plan for taper (hoping he will come off O2 before taper, will follow) -he will need follow up CT imaging to ensure clearance of opacities  -attempt sputum sample for PJP -suspect a bronchoscopy would be low yield at this point given administration of antibiotics and steroids -consider FOB if worsens but clinical trend thus far has been improvement  -add PPI with high dose steroids -assess ambulatory O2 needs prior to discharge  -send HSP panel  -will need pulmonary follow up as outpatient   Best practice:  Diet: per primary  DVT prophylaxis: lovenox  GI prophylaxis: PPI  Glucose control: SSI  Mobility: as tolerated  Code Status: Full Code  Family Communication: Patient updated on plan of care Disposition: per primary   Labs   CBC: Recent Labs  Lab 11/10/19 0827 11/13/19 2144 11/13/19 2155 11/14/19 0613 11/15/19 0609  WBC 5.2 5.5  --  3.8* 3.5*  NEUTROABS 3.7 4.3  --  3.0 3.0  HGB 8.9* 8.8* 8.8* 7.7* 9.7*  HCT 27.5*  27.7* 26.0* 24.6* 31.8*  MCV 88.7 91.7  --  92.5 92.4  PLT 172 162  --  133* 137*    Basic Metabolic Panel: Recent Labs  Lab 11/10/19 0827 11/13/19 2144 11/13/19 2155 11/14/19 0613 11/15/19 0609  NA 136 137 137 136 138  K 3.8 3.6 3.6 4.1 4.5  CL 103 106 105 106 108  CO2 21* 20*  --  20* 20*  GLUCOSE 208* 197* 190* 244* 315*  BUN _0 CREATININE 0.95 0.89 0.80 0.78 0.73  CALCIUM 8.7* 8.3*  --  7.8* 8.5*  MG  --   --   --   --  2.3  PHOS  --   --   --   --  4.7*   GFR: Estimated Creatinine Clearance: 87.5 mL/min (by C-G formula based on SCr of 0.73 mg/dL). Recent Labs  Lab 11/10/19  0827 11/10/19 1257 11/13/19 2144 11/13/19 2159 11/14/19 0012 11/14/19 0613 11/15/19 0609  PROCALCITON  --  <0.10  --   --   --   --   --   WBC 5.2  --  5.5  --   --  3.8* 3.5*  LATICACIDVEN  --   --   --  1.2 1.0  --   --     Liver Function Tests: Recent Labs  Lab 11/10/19 0827 11/13/19 2144 11/15/19 0609  AST _0 ALT _1 ALKPHOS 71 59 55  BILITOT 0.8 0.8 0.9  PROT 5.9* 5.8* 5.6*  ALBUMIN 3.1* 3.3* 3.0*   No results for input(s): LIPASE, AMYLASE in the last 168 hours. No results for input(s): AMMONIA in the last 168 hours.  ABG    Component Value Date/Time   HCO3 21.6 11/13/2019 2144   TCO2 21 (L) 11/13/2019 2155   O2SAT 86.3 11/13/2019 2144     Coagulation Profile: No results for input(s): INR, PROTIME in the last 168 hours.  Cardiac Enzymes: No results for input(s): CKTOTAL, CKMB, CKMBINDEX, TROPONINI in the last 168 hours.  HbA1C: Hgb A1c MFr Bld  Date/Time Value Ref Range Status  11/14/2019 06:13 AM 5.4 4.8 - 5.6 % Final    Comment:    (NOTE) Pre diabetes:          5.7%-6.4% Diabetes:              >6.4% Glycemic control for   <7.0% adults with diabetes     CBG: Recent Labs  Lab 11/14/19 1259 11/14/19 1646 11/14/19 2120 11/15/19 0728 11/15/19 1159  GLUCAP 136* 138* 239* 317* 389*    Review of Systems: Positives in [place]    Gen: Denies fever, chills, weight change, fatigue, night sweats HEENT: Denies blurred vision, double vision, hearing loss, tinnitus, sinus congestion, rhinorrhea, sore throat, neck stiffness, dysphagia PULM: Denies shortness of breath, pain with deep inspiration,  cough, sputum production, hemoptysis, wheezing CV: Denies chest pain, edema, orthopnea, paroxysmal nocturnal dyspnea, palpitations GI: Denies abdominal pain, nausea, vomiting, diarrhea, hematochezia, melena, constipation, change in bowel habits GU: Denies dysuria, hematuria, polyuria, oliguria, urethral discharge Endocrine: Denies hot or cold intolerance, polyuria, polyphagia or appetite change Derm: Denies rash, dry skin, scaling or peeling skin change Heme: Denies easy bruising, bleeding, bleeding gums Neuro: Denies headache, numbness, weakness, slurred speech, loss of memory or consciousness  Past Medical History  He,  has a past medical history of Cancer ([place]), Diabetes mellitus without complication ([place]), and Hypertension.   Surgical History    Past Surgical History:  Procedure Laterality Date  . APPENDECTOMY    . TONSILLECTOMY       Social History   reports that he has never smoked. He has never used smokeless tobacco. He reports current alcohol use of about 2.0 standard drinks of alcohol per week. He reports that he does not use drugs.   Family History   His family history is not on file.   Allergies Allergies  Allergen Reactions  . Ivp Dye [Iodinated Diagnostic Agents] Shortness Of Breath    "allergic to CT scan dye", took benadryl with last scan and tolerated well     Home Medications  Prior to Admission medications   Medication Sig Start Date End Date Taking? Authorizing Provider  allopurinol (ZYLOPRIM) 100 MG tablet Take 1 tablet (100 mg total) by mouth 2 (two) times daily. 06/20/19  Yes Brunetta Genera, MD  amLODipine (NORVASC) 10 MG  tablet Take 10 mg by mouth daily.   Yes [provider]   Ascorbic Acid (VITAMIN C) 500 MG CAPS Take 1 capsule by mouth daily.   Yes [provider]  CANNABIDIOL PO Take 30 mg by mouth daily.   Yes [provider]  Coenzyme Q10 (COQ-10 PO) Take 1 capsule by mouth daily. Takes CoQ 10 w/cinnamon   Yes [provider]  glipiZIDE (GLUCOTROL XL) 10 MG 24 hr tablet Take 20 mg by mouth every morning. 08/13/19  Yes [provider]  Glucosamine-Chondroitin (GLUCOSAMINE CHONDR COMPLEX PO) Take 1 tablet by mouth daily.   Yes [provider]  LORazepam (ATIVAN) 0.5 MG tablet Take 1 tablet (0.5 mg total) by mouth every 6 (six) hours as needed (Nausea or vomiting). 06/20/19  Yes Brunetta Genera, MD  metFORMIN (GLUCOPHAGE-XR) 500 MG 24 hr tablet Take 500 mg by mouth in the morning and at bedtime.  04/09/19  Yes [provider]  Multiple Vitamin (MULTIVITAMIN) tablet Take 1 tablet by mouth daily.   Yes [provider]  ondansetron (ZOFRAN) 8 MG tablet Take 1 tablet (8 mg total) by mouth 2 (two) times daily as needed for refractory nausea / vomiting. Start on day 2 after bendamustine chemotherapy. 06/20/19  Yes Brunetta Genera, MD  prochlorperazine (COMPAZINE) 10 MG tablet Take 1 tablet (10 mg total) by mouth every 6 (six) hours as needed (Nausea or vomiting). 06/20/19  Yes Brunetta Genera, MD  ramipril (ALTACE) 10 MG capsule Take 10 mg by mouth daily.   Yes [provider]  acyclovir (ZOVIRAX) 400 MG tablet Take 1 tablet (400 mg total) by mouth daily. Patient not taking: Reported on 11/13/2019 06/20/19   Brunetta Genera, MD  dexamethasone (DECADRON) 4 MG tablet Take 2 tablets (8 mg total) by mouth daily. Start the day after bendamustine chemotherapy for 2 days. Take with food. Patient not taking: Reported on 11/13/2019 06/20/19   Brunetta Genera, MD     Critical care time: n/a    Noe Gens, MSN, NP-C [facility] 11/15/2019, 2:12 PM   Please see  [email or web address] for pager details.

## 2019-11-15 NOTE — Progress Notes (Signed)
FSBS 445. Dr. Conception Oms made aware. To recheck FSBS in 4 hours per order.

## 2019-11-16 ENCOUNTER — Inpatient Hospital Stay (HOSPITAL_COMMUNITY): Payer: Medicare Other

## 2019-11-16 DIAGNOSIS — J189 Pneumonia, unspecified organism: Secondary | ICD-10-CM

## 2019-11-16 DIAGNOSIS — J849 Interstitial pulmonary disease, unspecified: Secondary | ICD-10-CM

## 2019-11-16 LAB — BPAM RBC
Blood Product Expiration Date: 202106092359
ISSUE DATE / TIME: 202105250032
Unit Type and Rh: 9500

## 2019-11-16 LAB — CBC WITH DIFFERENTIAL/PLATELET
Abs Immature Granulocytes: 0.14 10*3/uL — ABNORMAL HIGH (ref 0.00–0.07)
Basophils Absolute: 0 10*3/uL (ref 0.0–0.1)
Basophils Relative: 0 %
Eosinophils Absolute: 0 10*3/uL (ref 0.0–0.5)
Eosinophils Relative: 0 %
HCT: 29.5 % — ABNORMAL LOW (ref 39.0–52.0)
Hemoglobin: 9.2 g/dL — ABNORMAL LOW (ref 13.0–17.0)
Immature Granulocytes: 2 %
Lymphocytes Relative: 4 %
Lymphs Abs: 0.3 10*3/uL — ABNORMAL LOW (ref 0.7–4.0)
MCH: 29.1 pg (ref 26.0–34.0)
MCHC: 31.2 g/dL (ref 30.0–36.0)
MCV: 93.4 fL (ref 80.0–100.0)
Monocytes Absolute: 0.2 10*3/uL (ref 0.1–1.0)
Monocytes Relative: 3 %
Neutro Abs: 6.1 10*3/uL (ref 1.7–7.7)
Neutrophils Relative %: 91 %
Platelets: 142 10*3/uL — ABNORMAL LOW (ref 150–400)
RBC: 3.16 MIL/uL — ABNORMAL LOW (ref 4.22–5.81)
RDW: 17.1 % — ABNORMAL HIGH (ref 11.5–15.5)
WBC: 6.7 10*3/uL (ref 4.0–10.5)
nRBC: 0 % (ref 0.0–0.2)

## 2019-11-16 LAB — GLUCOSE, CAPILLARY
Glucose-Capillary: 404 mg/dL — ABNORMAL HIGH (ref 70–99)
Glucose-Capillary: 289 mg/dL — ABNORMAL HIGH (ref 70–99)
Glucose-Capillary: 344 mg/dL — ABNORMAL HIGH (ref 70–99)
Glucose-Capillary: 375 mg/dL — ABNORMAL HIGH (ref 70–99)
Glucose-Capillary: 322 mg/dL — ABNORMAL HIGH (ref 70–99)

## 2019-11-16 LAB — COMPREHENSIVE METABOLIC PANEL
ALT: 14 U/L (ref 0–44)
AST: 15 U/L (ref 15–41)
Albumin: 2.8 g/dL — ABNORMAL LOW (ref 3.5–5.0)
Alkaline Phosphatase: 52 U/L (ref 38–126)
Anion gap: 8 (ref 5–15)
BUN: 22 mg/dL (ref 8–23)
CO2: 20 mmol/L — ABNORMAL LOW (ref 22–32)
Calcium: 8.8 mg/dL — ABNORMAL LOW (ref 8.9–10.3)
Chloride: 107 mmol/L (ref 98–111)
Creatinine, Ser: 0.78 mg/dL (ref 0.61–1.24)
GFR calc Af Amer: >60 mL/min (ref >60)
GFR calc non Af Amer: >60 mL/min (ref >60)
Glucose, Bld: 328 mg/dL — ABNORMAL HIGH (ref 70–99)
Potassium: 4.4 mmol/L (ref 3.5–5.1)
Sodium: 135 mmol/L (ref 135–145)
Total Bilirubin: 0.3 mg/dL (ref 0.3–1.2)
Total Protein: 5.2 g/dL — ABNORMAL LOW (ref 6.5–8.1)

## 2019-11-16 LAB — MAGNESIUM: Magnesium: 2.4 mg/dL (ref 1.7–2.4)

## 2019-11-16 LAB — TYPE AND SCREEN
ABO/RH(D): O NEG
Antibody Screen: NEGATIVE
Unit division: 0

## 2019-11-16 LAB — LEGIONELLA PNEUMOPHILA SEROGP 1 UR AG: L. pneumophila Serogp 1 Ur Ag: NEGATIVE

## 2019-11-16 LAB — PHOSPHORUS: Phosphorus: 3.5 mg/dL (ref 2.5–4.6)

## 2019-11-16 MED ORDER — INSULIN ASPART 100 UNIT/ML ~~LOC~~ SOLN
8.0000 [IU] | Freq: Once | SUBCUTANEOUS | Status: AC
  Administered 2019-11-16: 8 [IU] via SUBCUTANEOUS

## 2019-11-16 MED ORDER — PREDNISONE 50 MG PO TABS
60.0000 mg | ORAL_TABLET | Freq: Every day | ORAL | Status: DC
  Administered 2019-11-17: 60 mg via ORAL
  Filled 2019-11-16: qty 1

## 2019-11-16 MED ORDER — METHYLPREDNISOLONE SODIUM SUCC 125 MG IJ SOLR
125.0000 mg | Freq: Three times a day (TID) | INTRAMUSCULAR | Status: DC
  Administered 2019-11-16 (×2): 125 mg via INTRAVENOUS
  Filled 2019-11-16 (×3): qty 2

## 2019-11-16 NOTE — Progress Notes (Signed)
Inpatient Diabetes Program Recommendations  AACE/ADA: New Consensus Statement on Inpatient Glycemic Control (2015)  Target Ranges:  Prepandial:   less than 140 mg/dL      Peak postprandial:   less than 180 mg/dL (1-2 hours)      Critically ill patients:  140 - 180 mg/dL   Lab Results  Component Value Date   GLUCAP 344 (H) 11/16/2019   HGBA1C 5.4 11/14/2019    Review of Glycemic Control   CBG > goal of less than 180 mg/dL. Needs insulin titration.  Inpatient Diabetes Program Recommendations:     Increase Levemir to 15 units QHS Increase Novolog to 5 units tidwc for meal coverage insulin.  Follow daily.  Thank you. Lorenda Peck, RD, LDN, CDE Inpatient Diabetes Coordinator [phone]

## 2019-11-16 NOTE — Progress Notes (Signed)
FSBS 404 Dr. Earlean Polka made aware

## 2019-11-16 NOTE — Progress Notes (Signed)
SATURATION QUALIFICATIONS: (This note is used to comply with regulatory documentation for home oxygen)  Patient Saturations on Room Air at Rest = 90%  Patient Saturations on Room Air while Ambulating = 90%  Patient Saturations on 0 Liters of oxygen while Ambulating = %  Please briefly explain why patient needs home oxygen:

## 2019-11-16 NOTE — Progress Notes (Addendum)
Dean Dixon Kitchen   HEMATOLOGY/ONCOLOGY INPATIENT PROGRESS NOTE  Date of Service: 11/16/2019  Inpatient Attending: .Mariel Aloe, MD  SUBJECTIVE  Breathing continues to improve.  He is no longer having any discomfort when he takes a deep breath.  O2 remains at 2 L via nasal cannula.  His O2 sats are in the upper 90s.  Denies cough.  He has no other complaints today.  PHYSICAL EXAMINATION: . Vitals:   11/15/19 1921 11/15/19 2126 11/16/19 0500 11/16/19 0910  BP: 120/74 113/84  122/71  Pulse: 83 80  66  Resp:  19  18  Temp:  97.7 F (36.5 C)  (!) 97.3 F (36.3 C)  TempSrc:  Oral  Oral  SpO2: 94% 97%  98%  Weight:   90.8 kg   Height:       Filed Weights   11/13/19 2131 11/15/19 0500 11/16/19 0500  Weight: 88.5 kg 91.1 kg 90.8 kg   .Body mass index is 34.36 kg/m.  GENERAL:alert SKIN: skin color, texture, turgor are normal, no rashes or significant lesions EYES: normal, conjunctiva are pink and non-injected, sclera clear OROPHARYNX:no exudate, no erythema and lips, buccal mucosa, and tongue normal  NECK: supple, no JVD, thyroid normal size, non-tender, without nodularity LYMPH:  no palpable lymphadenopathy in the cervical, axillary or inguinal LUNGS: b/l rales at the bases no rhonci HEART: regular rate & rhythm,  no murmurs and no lower extremity edema ABDOMEN: abdomen soft, non-tender, normoactive bowel sounds  Musculoskeletal: no cyanosis of digits and no clubbing  PSYCH: alert & oriented x 3 with fluent speech NEURO: no focal motor/sensory deficits  MEDICAL HISTORY:  Past Medical History:  Diagnosis Date  . Cancer ([employer])    Mantle Cell Lymphoma  . Diabetes mellitus without complication ([place])   . Hypertension     SURGICAL HISTORY: Past Surgical History:  Procedure Laterality Date  . APPENDECTOMY    . TONSILLECTOMY      SOCIAL HISTORY: Social History   Socioeconomic History  . Marital status: Divorced    Spouse name: Not on file  . Number of children: Not on file  .  Years of education: Not on file  . Highest education level: Not on file  Occupational History  . Not on file  Tobacco Use  . Smoking status: Never Smoker  . Smokeless tobacco: Never Used  Substance and Sexual Activity  . Alcohol use: Yes    Alcohol/week: 2.0 standard drinks    Types: 2 Standard drinks or equivalent per week  . Drug use: No  . Sexual activity: Not on file  Other Topics Concern  . Not on file  Social History Narrative  . Not on file   Social Determinants of Health   Financial Resource Strain:   . Difficulty of Paying Living Expenses:   Food Insecurity:   . Worried About Charity fundraiser in the Last Year:   . Arboriculturist in the Last Year:   Transportation Needs:   . Film/video editor (Medical):   Dean Dixon Kitchen Lack of Transportation (Non-Medical):   Physical Activity:   . Days of Exercise per Week:   . Minutes of Exercise per Session:   Stress:   . Feeling of Stress :   Social Connections:   . Frequency of Communication with Friends and Family:   . Frequency of Social Gatherings with Friends and Family:   . Attends Religious Services:   . Active Member of Clubs or Organizations:   . Attends Club or  Organization Meetings:   Dean Dixon Kitchen Marital Status:   Intimate Partner Violence:   . Fear of Current or Ex-Partner:   . Emotionally Abused:   Dean Dixon Kitchen Physically Abused:   . Sexually Abused:     FAMILY HISTORY: History reviewed. No pertinent family history.  ALLERGIES:  is allergic to ivp dye [iodinated diagnostic agents].  MEDICATIONS:  Scheduled Meds: . amLODipine  10 mg Oral Daily  . azithromycin  500 mg Oral QHS  . enoxaparin (LOVENOX) injection  40 mg Subcutaneous Q24H  . feeding supplement (ENSURE ENLIVE)  237 mL Oral BID BM  . insulin aspart  0-5 Units Subcutaneous QHS  . insulin aspart  0-9 Units Subcutaneous TID WC  . insulin aspart  3 Units Subcutaneous TID WC  . insulin detemir  10 Units Subcutaneous QHS  . methylPREDNISolone (SOLU-MEDROL) injection   125 mg Intravenous Q8H  . multivitamin with minerals  1 tablet Oral Daily  . pantoprazole  40 mg Oral Q1200  . ramipril  10 mg Oral Daily  . sodium chloride flush  3 mL Intravenous Q12H   Continuous Infusions: . sodium chloride    . ceFEPime (MAXIPIME) IV 2 g (11/16/19 0533)   PRN Meds:.sodium chloride, acetaminophen **OR** acetaminophen, heparin lock flush, heparin lock flush, HYDROcodone-acetaminophen, loperamide, LORazepam, ondansetron **OR** ondansetron (ZOFRAN) IV, senna-docusate, sodium chloride flush, sodium chloride flush, sodium chloride flush  REVIEW OF SYSTEMS:    10 Point review of Systems was done is negative except as noted above.   LABORATORY DATA:  I have reviewed the data as listed  . CBC Latest Ref Rng & Units 11/16/2019 11/15/2019 11/14/2019  WBC 4.0 - 10.5 K/uL 6.7 3.5(L) 3.8(L)  Hemoglobin 13.0 - 17.0 g/dL 9.2(L) 9.7(L) 7.7(L)  Hematocrit 39.0 - 52.0 % 29.5(L) 31.8(L) 24.6(L)  Platelets 150 - 400 K/uL 142(L) 137(L) 133(L)    . CMP Latest Ref Rng & Units 11/16/2019 11/15/2019 11/14/2019  Glucose 70 - 99 mg/dL 328(H) 315(H) 244(H)  BUN 8 - 23 mg/dL 22 15 14   Creatinine 0.61 - 1.24 mg/dL 0.78 0.73 0.78  Sodium 135 - 145 mmol/L 135 138 136  Potassium 3.5 - 5.1 mmol/L 4.4 4.5 4.1  Chloride 98 - 111 mmol/L 107 108 106  CO2 22 - 32 mmol/L 20(L) 20(L) 20(L)  Calcium 8.9 - 10.3 mg/dL 8.8(L) 8.5(L) 7.8(L)  Total Protein 6.5 - 8.1 g/dL 5.2(L) 5.6(L) -  Total Bilirubin 0.3 - 1.2 mg/dL 0.3 0.9 -  Alkaline Phos 38 - 126 U/L 52 55 -  AST 15 - 41 U/L 15 15 -  ALT 0 - 44 U/L 14 14 -     RADIOGRAPHIC STUDIES: I have personally reviewed the radiological images as listed and agreed with the findings in the report. DG Chest 2 View  Result Date: 11/10/2019 CLINICAL DATA:  Mantle cell lymphoma.  Fever and shortness of breath EXAM: CHEST - 2 VIEW COMPARISON:  Oct 23, 2014 chest radiograph and chest CT FINDINGS: The lungs are clear. The heart size and pulmonary vascularity are  normal. No adenopathy. There is degenerative change in the thoracic spine. IMPRESSION: Lungs clear.  Cardiac silhouette within normal limits. Electronically Signed   By: Lowella Grip III M.D.   On: 11/10/2019 10:03   CT Angio Chest PE W and/or Wo Contrast  Result Date: 11/14/2019 CLINICAL DATA:  Shortness of breath. EXAM: CT ANGIOGRAPHY CHEST WITH CONTRAST TECHNIQUE: Multidetector CT imaging of the chest was performed using the standard protocol during bolus administration of intravenous contrast. Multiplanar CT  image reconstructions and MIPs were obtained to evaluate the vascular anatomy. CONTRAST:  131mL OMNIPAQUE IOHEXOL 350 MG/ML SOLN COMPARISON:  PET-CT dated September 19, 2018 FINDINGS: Cardiovascular: Evaluation is limited by respiratory motion artifact. Given this limitation, there was no acute pulmonary embolism detected.The heart size is normal. There is no significant pericardial effusion. There is no evidence for thoracic aortic dissection or aneurysm. Mediastinum/Nodes: --No mediastinal or hilar lymphadenopathy. --No axillary lymphadenopathy. --No supraclavicular lymphadenopathy. --Normal thyroid gland. --The esophagus is unremarkable Lungs/Pleura: There are hazy bilateral ground-glass airspace opacities. There is some interlobular septal thickening. There are trace bilateral pleural effusions. There is atelectasis at the lung bases. There is no pneumothorax. The trachea is unremarkable. Upper Abdomen: Large cyst is again noted in the patient's left hepatic lobe. There is persistent splenomegaly. There is a heterogeneous appearance of the splenic parenchyma which is felt to be secondary to contrast timing in the absence of left upper quadrant abdominal pain. Musculoskeletal: No chest wall abnormality. No acute or significant osseous findings. Review of the MIP images confirms the above findings. IMPRESSION: 1. Evaluation for pulmonary emboli is limited by respiratory motion artifact. Given this  limitation, no acute pulmonary embolism was detected. 2. There are diffuse bilateral hazy ground-glass airspace opacities which may represent an atypical infectious process or pulmonary edema. 3. Splenomegaly. Electronically Signed   By: Constance Holster M.D.   On: 11/14/2019 01:36   DG CHEST PORT 1 VIEW  Result Date: 11/16/2019 CLINICAL DATA:  Short of breath EXAM: PORTABLE CHEST 1 VIEW COMPARISON:  11/15/2019 FINDINGS: Improved aeration lungs with decreased bibasilar airspace disease likely atelectasis. No heart failure or effusion. No focal pneumonia. IMPRESSION: Improved aeration of the lungs with decrease in bibasilar atelectasis/infiltrate. Electronically Signed   By: Franchot Gallo M.D.   On: 11/16/2019 09:13   DG CHEST PORT 1 VIEW  Result Date: 11/15/2019 CLINICAL DATA:  69 year old male with shortness of breath. EXAM: PORTABLE CHEST 1 VIEW COMPARISON:  CTA chest 11/14/2019 and earlier. FINDINGS: Portable AP semi upright view at 0520 hours. Continued indistinct/interstitial basilar predominant bilateral pulmonary opacity, stable from the CTA yesterday, mildly increased at the left base since 11/13/2019. Continued low lung volumes. Mediastinal contours remain normal. Visualized tracheal air column is within normal limits. No pneumothorax or pleural effusion. Paucity of bowel gas in the upper abdomen. No acute osseous abnormality identified. IMPRESSION: Continued low lung volumes with basilar predominant indistinct/interstitial opacity which could reflect a combination of atelectasis and infection versus edema. No pleural effusion. Electronically Signed   By: Genevie Ann M.D.   On: 11/15/2019 08:30   DG Chest Port 1 View  Result Date: 11/13/2019 CLINICAL DATA:  Initial evaluation for acute shortness of breath, weakness. EXAM: PORTABLE CHEST 1 VIEW COMPARISON:  Prior radiograph from 11/10/2019. FINDINGS: Exaggeration of the cardiac silhouette related AP technique and low lung volumes. Mediastinal  silhouette normal. Lungs hypoinflated. Patchy and hazy opacity seen within the mid and lower lungs bilaterally, which could reflect atelectasis or possibly infiltrates. Underlying mild pulmonary interstitial congestion. No appreciable pleural effusion. No pneumothorax. No acute osseous finding. IMPRESSION: 1. Patchy and hazy opacity within the mid and lower lungs bilaterally, which could reflect atelectasis or possibly infiltrates. 2. Underlying mild pulmonary interstitial congestion. Electronically Signed   By: Jeannine Boga M.D.   On: 11/13/2019 21:36   ECHOCARDIOGRAM COMPLETE  Result Date: 11/14/2019    ECHOCARDIOGRAM REPORT   Patient Name:   Dean Dixon Date of Exam: 11/14/2019 Medical Rec #:  [id number]  Height:       64.0 in Accession #:    [id number]   Weight:       195.0 lb Date of Birth:  1950/07/14   BSA:          1.935 m Patient Age:    13 years     BP:           158/72 mmHg Patient Gender: M            HR:           90 bpm. Exam Location:  Inpatient Procedure: 2D Echo, Cardiac Doppler and Color Doppler Indications:    Dyspnea 786.09 / R06.00  History:        Patient has no prior history of Echocardiogram examinations.                 Risk Factors:Hypertension, Diabetes and Non-Smoker.  Sonographer:    Vickie Epley RDCS Referring Phys: [id number] [place]  1. Left ventricular ejection fraction, by estimation, is 50 to 55%. The left ventricle has low normal function. The left ventricle has no regional wall motion abnormalities. Left ventricular diastolic parameters are consistent with Grade I diastolic dysfunction (impaired relaxation).  2. Right ventricular systolic function is normal. The right ventricular size is normal.  3. The mitral valve is normal in structure. No evidence of mitral valve regurgitation. No evidence of mitral stenosis.  4. The aortic valve is normal in structure. Aortic valve regurgitation is not visualized. No aortic stenosis is present. FINDINGS  Left  Ventricle: Left ventricular ejection fraction, by estimation, is 50 to 55%. The left ventricle has low normal function. The left ventricle has no regional wall motion abnormalities. The left ventricular internal cavity size was normal in size. There is no left ventricular hypertrophy. Left ventricular diastolic parameters are consistent with Grade I diastolic dysfunction (impaired relaxation). Right Ventricle: The right ventricular size is normal. No increase in right ventricular wall thickness. Right ventricular systolic function is normal. Left Atrium: Left atrial size was normal in size. Right Atrium: Right atrial size was normal in size. Pericardium: There is no evidence of pericardial effusion. Mitral Valve: The mitral valve is normal in structure. No evidence of mitral valve regurgitation. No evidence of mitral valve stenosis. Tricuspid Valve: The tricuspid valve is normal in structure. Tricuspid valve regurgitation is trivial. Aortic Valve: The aortic valve is normal in structure. Aortic valve regurgitation is not visualized. No aortic stenosis is present. Pulmonic Valve: The pulmonic valve was grossly normal. Pulmonic valve regurgitation is not visualized. Aorta: The aortic root and ascending aorta are structurally normal, with no evidence of dilitation. IAS/Shunts: The atrial septum is grossly normal.  LEFT VENTRICLE PLAX 2D LVIDd:         4.60 cm      Diastology LVIDs:         3.00 cm      LV e' lateral:   9.90 cm/s LV PW:         0.90 cm      LV E/e' lateral: 7.2 LV IVS:        0.90 cm      LV e' medial:    6.42 cm/s LVOT diam:     1.90 cm      LV E/e' medial:  11.1 LV SV:         58 LV SV Index:   30 LVOT Area:     2.84 cm  LV Volumes (MOD) LV  vol d, MOD A2C: 101.0 ml LV vol d, MOD A4C: 88.6 ml LV vol s, MOD A2C: 41.0 ml LV vol s, MOD A4C: 41.8 ml LV SV MOD A2C:     60.0 ml LV SV MOD A4C:     88.6 ml LV SV MOD BP:      52.8 ml RIGHT VENTRICLE RV S prime:     17.70 cm/s TAPSE (M-mode): 2.7 cm LEFT ATRIUM              Index       RIGHT ATRIUM          Index LA diam:        3.40 cm 1.76 cm/m  RA Area:     9.02 cm LA Vol (A2C):   27.9 ml 14.42 ml/m RA Volume:   14.80 ml 7.65 ml/m LA Vol (A4C):   35.0 ml 18.08 ml/m LA Biplane Vol: 33.8 ml 17.46 ml/m  AORTIC VALVE LVOT Vmax:   118.00 cm/s LVOT Vmean:  76.800 cm/s LVOT VTI:    0.204 m  AORTA Ao Root diam: 3.30 cm MITRAL VALVE MV Area (PHT): 3.50 cm    SHUNTS MV Decel Time: 217 msec    Systemic VTI:  0.20 m MV E velocity: 71.10 cm/s  Systemic Diam: 1.90 cm MV A velocity: 78.80 cm/s MV E/A ratio:  0.90 Mertie Moores MD Electronically signed by Mertie Moores MD Signature Date/Time: 11/14/2019/4:33:03 PM    Final     ASSESSMENT & PLAN:   69 yo with   1) Mantle cell lymphoma S/p 4 cycles of BR + 1 additional cycle of Rituxan  2) Symptomatic Anemia -- ? Likely related to chemotherapy  3) Acute hypoxic respiratory failure. Flu like illness with fevers. Fevers now resolved with b/l interstitial infiltrated and b/l ground glass opacities SARS-COV2 neg  Concern for ? Atypical pneumonia vs Rituxan related ILD or hypersensitivity pneumonitis.  4) Dm2 -- expect hyperglycemia in acute illness and from steroids  PLAN -transfuse PRBC prn for hgb <8 in the setting of acute respiratory failure and limited physiologic reserves.  Status post 1 unit PRBCs on 11/14/2019 with improvement of his hemoglobin.  No transfusion indicated today. -PJP PCR on expectorated sputum-ordered but still needs to be collected (not really coughing suspect we will not be able to collect this) -time course and presentation concern for Rituxan-ILD or Hypersensitivity pneumonitis and absence of overt infectious findings at this time --he has been started on IV steroids--decrease Solu-Medrol to 125 mg every 8 hours starting today and will continue to wean. -Order written to wean oxygen as tolerated.  He was reduced to 1 L/min during my visit.  Will monitor O2 sats very closely. -will need  management of hyperglycemia per hospitalist -Appreciate input from pulmonology. -oncology will continue to follow -no plan for addition chemo-immunotherapy at this time. -He was encouraged to ambulate in the hallway today. -He will need a PET/CT in about 5 to 6 weeks. -We will arrange for follow-up at the cancer center after discharge.  We will need to reconsider the safety of Rituxan use in the future. -Continue incentive spirometer  Mikey Bussing, DNP, AGPCNP-BC, AOCNP Mon/Tues/Thurs/Fri 7am-5pm; Off Wednesdays Cell: [id number]   ADDENDUM  .Patient was Personally and independently interviewed, examined and relevant elements of the history of present illness were reviewed in details and an assessment and plan was created. All elements of the patient's history of present illness , assessment and plan were discussed in details with Mikey Bussing,  DNP. The above documentation reflects our combined findings assessment and plan.  Patient is feeling better and is being weaned off oxygen. CXR - much improved. . Saturating well on 1L/min [place]...being weaned off oxygen. Will transition from IV Solumedrol to PO Prednisone and taper from tommorrow   Sullivan Lone MD MS

## 2019-11-16 NOTE — Progress Notes (Signed)
NAME:  Dean Dixon, MRN:  [id number], DOB:  Dec 15, 1950, LOS: 2 ADMISSION DATE:  11/13/2019, CONSULTATION DATE:  11/15/19 REFERRING MD:  Dr. Alfredia Ferguson, CHIEF COMPLAINT:  Abnormal CT Chest    Brief History   69 y/o M with Mantle Cell Lymphoma admitted on 5/23 with 2 week history of shortness of breath.  CTA of the chest negative for PE but showed diffuse bilateral ground glass opacities.    History of present illness   69 y/o M, with Mantle Cell Lymphoma (diagnosed in 05/2019) who presented 5/23 to [facility] with reports of shortness of breath.    The patient reported on presentation that he had been feeling more short of breath for two weeks.   He had fevers for several days around the week of 4/28 but resolved with aspirin.  He was planned for chemotherapy on 5/20 but missed due to dyspnea.  His symptoms continued to worsen prompting ER evaluation on 5/23.  On initial ER assessment he was noted to have room air saturations of 81%, tachycardia and tachypnea.  Initial CXR was concerning for patchy bilateral mid and lower opacities, mild interstitial congestion. COVID testing was negative.  To further evaluate hypoxemia a CTA of the chest was completed which was negative for pulmonary embolism, motion degraded, diffuse bilateral hazy ground-glass airspace opacities and splenomegaly noted.  The patient was admitted for further evaluation.  He was treated empirically with azithromycin and cefepime for possible pulmonary infection.  He continued to require 5L O2 to maintain saturations.  ECHO was assessed which showed normal LV function and grade I diastolic dysfunction. The patient was seen by Oncology and given 1 unit PRBC for Hgb of 7.7.  He was started on high dose steroids ('125mg'$  solumedrol Q6) as well.  O2 weaned to 2L.    PCCM consulted for evaluation of bilateral ground glass opacities.   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  Denies difficulty swallowing / choking on foods.  Notes he has lost 40  lbs since his diagnosis.  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  He has traveled all over the world for work, noted travel to [place] where he picked up a "parasite" that he was treated for at [facility].  He is a lifelong never smoker / no vaping. 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  He self identified he had lymphocytosis and asked his PCP to order a CBC which led to the identification of Mantle Cell.  He is treated by Dr. Irene Limbo with Bendamustine + Rituxan.  He notes he has significant rigors with rituxan administration requiring administration of demerol.    Past Medical History  Mantle Cell Lymphoma  HTN DM II   Significant Hospital Events   5/23 Admit  5/25 PCCM consulted   Consults:  ONC PCCM   Procedures:    Significant Diagnostic Tests:   CTA Chest 5/24 >> motion degraded, diffuse bilateral hazy ground-glass airspace opacities and splenomegaly   ECHO 5/24 >> LVEF 50-55%, LV has low normal function, no RWMA, grade I diastolic dysfunction, RV systolic function normal, LA/RA normal in size  Micro Data:  COVID 5/23 >> negative  MRSA PCR 5/24 >> negative  RVP 5/24 >> negative  BCx2 5/23 >>   Antimicrobials:  Vancomycin 5/24 x1  Azithro 5/24 >>  Cefepime 5/24 >>  Interim history/subjective:  Pt reports feeling better, states less chest discomfort with deep breathing  O2 weaned to 1L  No cough / no sputum production Afebrile Had loose BM / reports "normal for patient with metformin"  Objective   Blood pressure 122/71, pulse 66, temperature (!) 97.3 F (36.3 C), temperature source Oral, resp. rate 18, height '5\' 4"'$  (1.626 m), weight 90.8 kg, SpO2 98 %.        Intake/Output Summary (Last 24 hours) at 11/16/2019 1142 Last data filed at 11/16/2019 1100 Gross per 24 hour  Intake 1135.74 ml  Output 1500 ml  Net -364.26 ml   Filed Weights   11/13/19 2131 11/15/19 0500 11/16/19 0500  Weight: 88.5 kg  91.1 kg 90.8 kg    Examination: General: elderly male sitting up in chair in NAD, friend at bedside  HEENT: MM pink/moist, [place] O2 1L, good dentition Neuro: AAOx4, speech clear, MAE CV: s1s2 RRR, no m/r/g PULM: non-labored at rest, lungs bilaterally with improved posterior crackles GI: soft, bsx4 active  Extremities: warm/dry, noedema  Skin: no rashes or lesions  CXR 5/26 >> images personally reviewed, improved overall aeration, decreased bibasilar atelectasis / opacities  Resolved Hospital Problem list      Assessment & Plan:   Acute Hypoxemic Respiratory Failure in setting of Diffuse Bilateral GGO  Initially requiring 5L O2. Diffuse bilateral ground glass opacities on CT imaging.  Patient has Mantle Cell Lymphoma with initial diagnosis in 05/2019 on bendamustine + rituximab.  He missed his last dose on 5/20 due to dyspnea.  Differential diagnosis includes CAP, opportunistic infection such as PJP, BOOP/COP, and ILD / hypersensitivity pneumonitis with rituximab (~18% pulmonary toxicity risk) and pet birds.  Less likely aspiration as no subjective hx to support. COVID negative. He has improved on steroids and antibiotics.  Strep antigen negative. Home medications reviewed and no offending agents.  CXR improved 5/26 which would go against acute bacterial infectious process as would likely be slower to clear radiographically / time course more consistent with .  -continue abx, recommend minimum of 7 days -continue steroids with taper, consider transition to Q12 dosing -follow up with pulmonary as outpatient, will need repeat imaging to ensure clearance of opacities -doubt he will be able to produce sputum  -if worsens or recurrence, consider bronchoscopy -continue PPI with high dose steroids -assess ambulatory O2 needs prior to discharge -follow up HSP panel -Oncology reconsidering use of Rituxan in the future  Best practice:  Diet: per primary  DVT prophylaxis: lovenox  GI  prophylaxis: PPI  Glucose control: SSI  Mobility: as tolerated  Code Status: Full Code  Family Communication: Patient updated on plan of care 5/26 Disposition: per primary   Labs   CBC: Recent Labs  Lab 11/10/19 0827 11/10/19 0827 11/13/19 2144 11/13/19 2155 11/14/19 0613 11/15/19 0609 11/16/19 0525  WBC 5.2  --  5.5  --  3.8* 3.5* 6.7  NEUTROABS 3.7  --  4.3  --  3.0 3.0 6.1  HGB 8.9*  --  8.8* 8.8* 7.7* 9.7* 9.2*  HCT 27.5*   < > 27.7* 26.0* 24.6* 31.8* 29.5*  MCV 88.7  --  91.7  --  92.5 92.4 93.4  PLT 172  --  162  --  133* 137* 142*   < > = values in this interval not displayed.    Basic Metabolic Panel: Recent Labs  Lab 11/10/19 0827 11/10/19 0827 11/13/19 2144 11/13/19 2155 11/14/19 0613 11/15/19 0609 11/16/19 0525  NA 136   < > 137 137 136 138 135  K 3.8   < > 3.6  3.6 4.1 4.5 4.4  CL 103   < > 106 105 106 108 107  CO2 21*  --  20*  --  20* 20* 20*  GLUCOSE 208*   < > 197* 190* 244* 315* 328*  BUN 15   < > '14 12 14 15 22  '$ CREATININE 0.95  --  0.89 0.80 0.78 0.73 0.78  CALCIUM 8.7*  --  8.3*  --  7.8* 8.5* 8.8*  MG  --   --   --   --   --  2.3 2.4  PHOS  --   --   --   --   --  4.7* 3.5   < > = values in this interval not displayed.   GFR: Estimated Creatinine Clearance: 87.3 mL/min (by C-G formula based on SCr of 0.78 mg/dL). Recent Labs  Lab 11/10/19 1257 11/13/19 2144 11/13/19 2159 11/14/19 0012 11/14/19 0613 11/15/19 0609 11/16/19 0525  PROCALCITON <0.10  --   --   --   --   --   --   WBC  --  5.5  --   --  3.8* 3.5* 6.7  LATICACIDVEN  --   --  1.2 1.0  --   --   --     Liver Function Tests: Recent Labs  Lab 11/10/19 0827 11/13/19 2144 11/15/19 0609 11/16/19 0525  AST '18 17 15 15  '$ ALT '16 15 14 14  '$ ALKPHOS 71 59 55 52  BILITOT 0.8 0.8 0.9 0.3  PROT 5.9* 5.8* 5.6* 5.2*  ALBUMIN 3.1* 3.3* 3.0* 2.8*   No results for input(s): LIPASE, AMYLASE in the last 168 hours. No results for input(s): AMMONIA in the last 168 hours.  ABG      Component Value Date/Time   HCO3 21.6 11/13/2019 2144   TCO2 21 (L) 11/13/2019 2155   O2SAT 86.3 11/13/2019 2144     Coagulation Profile: No results for input(s): INR, PROTIME in the last 168 hours.  Cardiac Enzymes: No results for input(s): CKTOTAL, CKMB, CKMBINDEX, TROPONINI in the last 168 hours.  HbA1C: Hgb A1c MFr Bld  Date/Time Value Ref Range Status  11/14/2019 06:13 AM 5.4 4.8 - 5.6 % Final    Comment:    (NOTE) Pre diabetes:          5.7%-6.4% Diabetes:              >6.4% Glycemic control for   <7.0% adults with diabetes     CBG: Recent Labs  Lab 11/15/19 1702 11/15/19 1704 11/15/19 2120 11/16/19 0123 11/16/19 0746  GLUCAP 370* 365* 445* 404* 289*    Critical care time: n/a    Noe Gens, MSN, NP-C [facility] 11/16/2019, 11:42 AM   Please see [email or web address] for pager details.

## 2019-11-16 NOTE — Progress Notes (Signed)
PROGRESS NOTE    Dean Dixon  [id number] DOB: [birth year] DOA: 11/13/2019 PCP: Leonides Sake, MD   Brief Narrative: Dean Dixon is a 69 y.o. male with a history of mantle cell lymphoma, diabetes mellitus type 2, hypertension. Patient presented secondary to dyspnea and found to have respiratory failure from pneumonia vs adverse reaction from Rituxin.    Assessment & Plan:   Principal Problem:   Acute respiratory failure with hypoxia (HCC) Active Problems:   HTN (hypertension)   Mantle cell lymphoma of lymph nodes of multiple regions (HCC)   Pneumonia   Diabetes mellitus without complication (HCC)   Bilateral pulmonary infiltrates on CXR   ILD (interstitial lung disease) ([place])   Acute respiratory failure with hypoxia Unclear etiology. Possibly infectious vs inflammatory vs adverse affect from Rituxin. Transthoracic Echocardiogram significant for normal EF and no valvular stenosis. Patient with significant improvement from admission but still requiring oxygen. Patient started on Vancomycin, Cefepime, azithromycin and Solu-medrol for treatment. Vancomycin discontinued. Pulmonology consulted and are providing recommendations. -Wean oxygen to room air -Ambulatory pulse oximetry -PT consulted and recommendations are pending  Atypical pneumonia Possibly diagnosis. Management above.  Anemia Baseline hemoglobin appears to be 9-10. Patient presented with a hemoglobin of 8.9 with a drop down to 7.7. He received 1 unit of PRBC. Hemoglobin stable. No evidence of bleeding.  Thrombocytopenia Mild and stable. Improved slightly. Appears likely transient.  Mantle cell lymphoma Patient currently receiving immunotherapy. Oncology on board.  Diabetes mellitus, type 2 Patient is on metformin and glipizide as an outpatient. Blood sugar uncontrolled during hospitalization in setting of holding home medications and initiation of IV steroids. Patient started on Levemir daily in addition to  mealtime coverage and SSI blood coverage with continued uncontrolled blood sugar. -Increase to Levemir 15 units qHS -Continue Novolog 3 units TID wc in addition to SSI  Essential hypertension -Continue Ramipril  Diarrhea Given loperamide  Respiratory alkalosis with compensatory metabolic acidosis Metabolic acidosis is stabile.  Hyperphosphatemia Phosphorus of 4.7 on admission which has now resolved.  GERD -Continue Protonix  Diastolic dysfunction No clinical evidence of heart failure.  Obestiy Body mass index is 34.36 kg/m.   DVT prophylaxis: Lovenox Code Status:   Code Status: Full Code Family Communication: None at bedside Disposition Plan: Discharge likely in 24 hours pending transition to oral antibiotics, oral steroids, PT evaluation and Pulmonology recommendations. Patient is still requiring oxygen and will attempt to wean to room air in addition to walking with pulse oximetry   Consultants:   Pulmonology  Procedures:   TRANSTHORACIC ECHOCARDIOGRAM (11/14/2019) IMPRESSIONS    1. Left ventricular ejection fraction, by estimation, is 50 to 55%. The  left ventricle has low normal function. The left ventricle has no regional  wall motion abnormalities. Left ventricular diastolic parameters are  consistent with Grade I diastolic  dysfunction (impaired relaxation).  2. Right ventricular systolic function is normal. The right ventricular  size is normal.  3. The mitral valve is normal in structure. No evidence of mitral valve  regurgitation. No evidence of mitral stenosis.  4. The aortic valve is normal in structure. Aortic valve regurgitation is  not visualized. No aortic stenosis is present.  Antimicrobials:  Cefepime  Azithromycin    Subjective: Breathing continues to improve from initial presentation. No sputum. No chest pain.Trying to ambulate more.  Objective: Vitals:   11/15/19 1921 11/15/19 2126 11/16/19 0500 11/16/19 0910  BP: 120/74  113/84  122/71  Pulse: 83 80  66  Resp:  19  18  Temp:  97.7 F (36.5 C)  (!) 97.3 F (36.3 C)  TempSrc:  Oral  Oral  SpO2: 94% 97%  98%  Weight:   90.8 kg   Height:        Intake/Output Summary (Last 24 hours) at 11/16/2019 1313 Last data filed at 11/16/2019 1100 Gross per 24 hour  Intake 895.74 ml  Output 1300 ml  Net -404.26 ml   Filed Weights   11/13/19 2131 11/15/19 0500 11/16/19 0500  Weight: 88.5 kg 91.1 kg 90.8 kg    Examination:  General exam: Appears calm and comfortable Respiratory system: Clear to auscultation except for bilateral lower rales. Respiratory effort normal. Cardiovascular system: S1 & S2 heard, RRR. 2/6 systolic murmur Gastrointestinal system: Abdomen is nondistended, soft and nontender. No organomegaly or masses felt. Normal bowel sounds heard. Central nervous system: Alert and oriented. No focal neurological deficits. Musculoskeletal: No edema. No calf tenderness Skin: No cyanosis. No rashes Psychiatry: Judgement and insight appear normal. Mood & affect appropriate.     Data Reviewed: I have personally reviewed following labs and imaging studies  CBC    Component Value Date/Time   WBC 6.7 11/16/2019 0525   RBC 3.16 (L) 11/16/2019 0525   HGB 9.2 (L) 11/16/2019 0525   HGB 8.9 (L) 11/10/2019 0827   HCT 29.5 (L) 11/16/2019 0525   PLT 142 (L) 11/16/2019 0525   PLT 172 11/10/2019 0827   MCV 93.4 11/16/2019 0525   MCH 29.1 11/16/2019 0525   MCHC 31.2 11/16/2019 0525   RDW 17.1 (H) 11/16/2019 0525   LYMPHSABS 0.3 (L) 11/16/2019 0525   MONOABS 0.2 11/16/2019 0525   EOSABS 0.0 11/16/2019 0525   BASOSABS 0.0 11/16/2019 0525     CMP     Component Value Date/Time   NA 135 11/16/2019 0525   K 4.4 11/16/2019 0525   CL 107 11/16/2019 0525   CO2 20 (L) 11/16/2019 0525   GLUCOSE 328 (H) 11/16/2019 0525   BUN 22 11/16/2019 0525   CREATININE 0.78 11/16/2019 0525   CREATININE 0.95 11/10/2019 0827   CALCIUM 8.8 (L) 11/16/2019 0525   PROT 5.2  (L) 11/16/2019 0525   ALBUMIN 2.8 (L) 11/16/2019 0525   AST 15 11/16/2019 0525   AST 18 11/10/2019 0827   ALT 14 11/16/2019 0525   ALT 16 11/10/2019 0827   ALKPHOS 52 11/16/2019 0525   BILITOT 0.3 11/16/2019 0525   BILITOT 0.8 11/10/2019 0827   GFRNONAA >60 11/16/2019 0525   GFRNONAA >60 11/10/2019 0827   GFRAA >60 11/16/2019 0525   GFRAA >60 11/10/2019 0827    CBG (last 3)  Recent Labs    11/16/19 0123 11/16/19 0746 11/16/19 1221  GLUCAP 404* 289* 344*     GFR: Estimated Creatinine Clearance: 87.3 mL/min (by C-G formula based on SCr of 0.78 mg/dL).  Estimated Creatinine Clearance: 87.3 mL/min (by C-G formula based on SCr of 0.78 mg/dL).  Liver Function Tests: Recent Labs  Lab 11/10/19 0827 11/13/19 2144 11/15/19 0609 11/16/19 0525  AST 18 17 15 15   ALT 16 15 14 14   ALKPHOS 71 59 55 52  BILITOT 0.8 0.8 0.9 0.3  PROT 5.9* 5.8* 5.6* 5.2*  ALBUMIN 3.1* 3.3* 3.0* 2.8*   No results for input(s): LIPASE, AMYLASE in the last 168 hours. No results for input(s): AMMONIA in the last 168 hours.  Coagulation Profile: No results for input(s): INR, PROTIME in the last 168 hours.  Recent Results (from the past 240 hour(s))  SARS Coronavirus 2  by RT PCR (hospital order, performed in [facility] hospital lab) Nasopharyngeal Nasopharyngeal Swab     Status: None   Collection Time: 11/13/19  9:30 PM   Specimen: Nasopharyngeal Swab  Result Value Ref Range Status   SARS Coronavirus 2 NEGATIVE NEGATIVE Final    Comment: (NOTE) SARS-CoV-2 target nucleic acids are NOT DETECTED. The SARS-CoV-2 RNA is generally detectable in upper and lower respiratory specimens during the acute phase of infection. The lowest concentration of SARS-CoV-2 viral copies this assay can detect is 250 copies / mL. A negative result does not preclude SARS-CoV-2 infection and should not be used as the sole basis for treatment or other patient management decisions.  A negative result may occur with improper  specimen collection / handling, submission of specimen other than nasopharyngeal swab, presence of viral mutation(s) within the areas targeted by this assay, and inadequate number of viral copies (<250 copies / mL). A negative result must be combined with clinical observations, patient history, and epidemiological information. Fact Sheet for Patients:   StrictlyIdeas.no Fact Sheet for Healthcare Providers: BankingDealers.co.za This test is not yet approved or cleared  by the [place] FDA and has been authorized for detection and/or diagnosis of SARS-CoV-2 by FDA under an Emergency Use Authorization (EUA).  This EUA will remain in effect (meaning this test can be used) for the duration of the COVID-19 declaration under Section 564(b)(1) of the Act, 21 U.S.C. section 360bbb-3(b)(1), unless the authorization is terminated or revoked sooner. Performed at [facility], [place] [address], [place], [place] [address]   Blood culture (routine x 2)     Status: None (Preliminary result)   Collection Time: 11/13/19  9:59 PM   Specimen: BLOOD  Result Value Ref Range Status   Specimen Description   Final    BLOOD RIGHT ANTECUBITAL Performed at [place] [address], [place], [place] [address]    Special Requests   Final    BOTTLES DRAWN AEROBIC AND ANAEROBIC Blood Culture adequate volume Performed at [place] [address], [place], [place] [address]    Culture   Final    NO GROWTH 1 DAY Performed at [facility], [place] [address] [place], [place] [address]    Report Status PENDING  Incomplete  Blood culture (routine x 2)     Status: None (Preliminary result)   Collection Time: 11/14/19 12:12 AM   Specimen: BLOOD RIGHT HAND  Result Value Ref Range Status   Specimen Description   Final    BLOOD RIGHT HAND Performed at [place] [address], [place], [place] [address]    Special Requests   Final    BOTTLES DRAWN AEROBIC AND ANAEROBIC Blood Culture results may not be optimal due to an inadequate volume of blood received in culture bottles Performed at [place] [address] [place], [place] [address]    Culture   Final    NO GROWTH 1 DAY Performed at [facility], [place] [address], [place], [place] [address]    Report Status PENDING  Incomplete  MRSA PCR Screening     Status: None   Collection Time: 11/14/19  9:31 AM   Specimen: Nasal Mucosa; Nasopharyngeal  Result Value Ref Range Status   MRSA by PCR NEGATIVE NEGATIVE Final    Comment:        The GeneXpert MRSA Assay (FDA approved for NASAL specimens only), is one component of  a comprehensive MRSA colonization surveillance program. It is not intended to diagnose MRSA infection nor to guide or monitor treatment for MRSA infections. Performed at [facility], [place] [address], [place], [place] [address]   Respiratory Panel by PCR     Status: None   Collection Time: 11/14/19  8:30 PM   Specimen: Nasopharyngeal Swab; Respiratory  Result Value Ref Range Status   Adenovirus NOT DETECTED NOT DETECTED Final   Coronavirus 229E NOT DETECTED NOT DETECTED Final    Comment: (NOTE) The Coronavirus on the Respiratory Panel, DOES NOT test for the novel  Coronavirus (2019 nCoV)    Coronavirus HKU1 NOT DETECTED NOT DETECTED Final   Coronavirus NL63 NOT DETECTED NOT DETECTED Final   Coronavirus OC43 NOT DETECTED NOT DETECTED Final   Metapneumovirus NOT DETECTED NOT DETECTED Final   Rhinovirus / Enterovirus NOT DETECTED NOT DETECTED Final   Influenza A NOT DETECTED NOT DETECTED Final   Influenza B NOT DETECTED NOT DETECTED Final   Parainfluenza Virus 1 NOT DETECTED NOT DETECTED Final   Parainfluenza Virus 2 NOT DETECTED NOT DETECTED Final   Parainfluenza Virus 3 NOT DETECTED NOT DETECTED Final   Parainfluenza Virus 4 NOT DETECTED  NOT DETECTED Final   Respiratory Syncytial Virus NOT DETECTED NOT DETECTED Final   Bordetella pertussis NOT DETECTED NOT DETECTED Final   Chlamydophila pneumoniae NOT DETECTED NOT DETECTED Final   Mycoplasma pneumoniae NOT DETECTED NOT DETECTED Final    Comment: Performed at [facility], [place]. [address] [place], [place] [address]        Radiology Studies: DG CHEST PORT 1 VIEW  Result Date: 11/16/2019 CLINICAL DATA:  Short of breath EXAM: PORTABLE CHEST 1 VIEW COMPARISON:  11/15/2019 FINDINGS: Improved aeration lungs with decreased bibasilar airspace disease likely atelectasis. No heart failure or effusion. No focal pneumonia. IMPRESSION: Improved aeration of the lungs with decrease in bibasilar atelectasis/infiltrate. Electronically Signed   By: Franchot Gallo M.D.   On: 11/16/2019 09:13   DG CHEST PORT 1 VIEW  Result Date: 11/15/2019 CLINICAL DATA:  69 year old male with shortness of breath. EXAM: PORTABLE CHEST 1 VIEW COMPARISON:  CTA chest 11/14/2019 and earlier. FINDINGS: Portable AP semi upright view at 0520 hours. Continued indistinct/interstitial basilar predominant bilateral pulmonary opacity, stable from the CTA yesterday, mildly increased at the left base since 11/13/2019. Continued low lung volumes. Mediastinal contours remain normal. Visualized tracheal air column is within normal limits. No pneumothorax or pleural effusion. Paucity of bowel gas in the upper abdomen. No acute osseous abnormality identified. IMPRESSION: Continued low lung volumes with basilar predominant indistinct/interstitial opacity which could reflect a combination of atelectasis and infection versus edema. No pleural effusion. Electronically Signed   By: Genevie Ann M.D.   On: 11/15/2019 08:30   ECHOCARDIOGRAM COMPLETE  Result Date: 11/14/2019    ECHOCARDIOGRAM REPORT   Patient Name:   MARVIE FELDE Date of Exam: 11/14/2019 Medical Rec #:  [id number]    Height:       64.0 in Accession #:    [id number]    Weight:       195.0 lb Date of Birth:  Jul 05, 1950   BSA:          1.935 m Patient Age:    58 years     BP:           158/72 mmHg Patient Gender: M            HR:  90 bpm. Exam Location:  Inpatient Procedure: 2D Echo, Cardiac Doppler and Color Doppler Indications:    Dyspnea 786.09 / R06.00  History:        Patient has no prior history of Echocardiogram examinations.                 Risk Factors:Hypertension, Diabetes and Non-Smoker.  Sonographer:    Vickie Epley RDCS Referring Phys: [id number] [place]  1. Left ventricular ejection fraction, by estimation, is 50 to 55%. The left ventricle has low normal function. The left ventricle has no regional wall motion abnormalities. Left ventricular diastolic parameters are consistent with Grade I diastolic dysfunction (impaired relaxation).  2. Right ventricular systolic function is normal. The right ventricular size is normal.  3. The mitral valve is normal in structure. No evidence of mitral valve regurgitation. No evidence of mitral stenosis.  4. The aortic valve is normal in structure. Aortic valve regurgitation is not visualized. No aortic stenosis is present. FINDINGS  Left Ventricle: Left ventricular ejection fraction, by estimation, is 50 to 55%. The left ventricle has low normal function. The left ventricle has no regional wall motion abnormalities. The left ventricular internal cavity size was normal in size. There is no left ventricular hypertrophy. Left ventricular diastolic parameters are consistent with Grade I diastolic dysfunction (impaired relaxation). Right Ventricle: The right ventricular size is normal. No increase in right ventricular wall thickness. Right ventricular systolic function is normal. Left Atrium: Left atrial size was normal in size. Right Atrium: Right atrial size was normal in size. Pericardium: There is no evidence of pericardial effusion. Mitral Valve: The mitral valve is normal in structure. No evidence of mitral  valve regurgitation. No evidence of mitral valve stenosis. Tricuspid Valve: The tricuspid valve is normal in structure. Tricuspid valve regurgitation is trivial. Aortic Valve: The aortic valve is normal in structure. Aortic valve regurgitation is not visualized. No aortic stenosis is present. Pulmonic Valve: The pulmonic valve was grossly normal. Pulmonic valve regurgitation is not visualized. Aorta: The aortic root and ascending aorta are structurally normal, with no evidence of dilitation. IAS/Shunts: The atrial septum is grossly normal.  LEFT VENTRICLE PLAX 2D LVIDd:         4.60 cm      Diastology LVIDs:         3.00 cm      LV e' lateral:   9.90 cm/s LV PW:         0.90 cm      LV E/e' lateral: 7.2 LV IVS:        0.90 cm      LV e' medial:    6.42 cm/s LVOT diam:     1.90 cm      LV E/e' medial:  11.1 LV SV:         58 LV SV Index:   30 LVOT Area:     2.84 cm  LV Volumes (MOD) LV vol d, MOD A2C: 101.0 ml LV vol d, MOD A4C: 88.6 ml LV vol s, MOD A2C: 41.0 ml LV vol s, MOD A4C: 41.8 ml LV SV MOD A2C:     60.0 ml LV SV MOD A4C:     88.6 ml LV SV MOD BP:      52.8 ml RIGHT VENTRICLE RV S prime:     17.70 cm/s TAPSE (M-mode): 2.7 cm LEFT ATRIUM             Index  RIGHT ATRIUM          Index LA diam:        3.40 cm 1.76 cm/m  RA Area:     9.02 cm LA Vol (A2C):   27.9 ml 14.42 ml/m RA Volume:   14.80 ml 7.65 ml/m LA Vol (A4C):   35.0 ml 18.08 ml/m LA Biplane Vol: 33.8 ml 17.46 ml/m  AORTIC VALVE LVOT Vmax:   118.00 cm/s LVOT Vmean:  76.800 cm/s LVOT VTI:    0.204 m  AORTA Ao Root diam: 3.30 cm MITRAL VALVE MV Area (PHT): 3.50 cm    SHUNTS MV Decel Time: 217 msec    Systemic VTI:  0.20 m MV E velocity: 71.10 cm/s  Systemic Diam: 1.90 cm MV A velocity: 78.80 cm/s MV E/A ratio:  0.90 Mertie Moores MD Electronically signed by Mertie Moores MD Signature Date/Time: 11/14/2019/4:33:03 PM    Final         Scheduled Meds:  amLODipine  10 mg Oral Daily   azithromycin  500 mg Oral QHS   enoxaparin  (LOVENOX) injection  40 mg Subcutaneous Q24H   feeding supplement (ENSURE ENLIVE)  237 mL Oral BID BM   insulin aspart  0-5 Units Subcutaneous QHS   insulin aspart  0-9 Units Subcutaneous TID WC   insulin aspart  3 Units Subcutaneous TID WC   insulin detemir  10 Units Subcutaneous QHS   methylPREDNISolone (SOLU-MEDROL) injection  125 mg Intravenous Q8H   multivitamin with minerals  1 tablet Oral Daily   pantoprazole  40 mg Oral Q1200   ramipril  10 mg Oral Daily   sodium chloride flush  3 mL Intravenous Q12H   Continuous Infusions:  sodium chloride     ceFEPime (MAXIPIME) IV 2 g (11/16/19 0533)     LOS: 2 days     Cordelia Poche, MD [facility] 11/16/2019, 1:13 PM  If 7PM-7AM, please contact night-coverage [email or web address]

## 2019-11-17 ENCOUNTER — Inpatient Hospital Stay (HOSPITAL_COMMUNITY): Payer: Medicare Other

## 2019-11-17 LAB — CBC WITH DIFFERENTIAL/PLATELET
Abs Immature Granulocytes: 0.18 10*3/uL — ABNORMAL HIGH (ref 0.00–0.07)
Basophils Absolute: 0 10*3/uL (ref 0.0–0.1)
Basophils Relative: 0 %
Eosinophils Absolute: 0 10*3/uL (ref 0.0–0.5)
Eosinophils Relative: 0 %
HCT: 29.7 % — ABNORMAL LOW (ref 39.0–52.0)
Hemoglobin: 9.1 g/dL — ABNORMAL LOW (ref 13.0–17.0)
Immature Granulocytes: 3 %
Lymphocytes Relative: 3 %
Lymphs Abs: 0.2 10*3/uL — ABNORMAL LOW (ref 0.7–4.0)
MCH: 28.4 pg (ref 26.0–34.0)
MCHC: 30.6 g/dL (ref 30.0–36.0)
MCV: 92.8 fL (ref 80.0–100.0)
Monocytes Absolute: 0.3 10*3/uL (ref 0.1–1.0)
Monocytes Relative: 5 %
Neutro Abs: 5.8 10*3/uL (ref 1.7–7.7)
Neutrophils Relative %: 89 %
Platelets: 127 10*3/uL — ABNORMAL LOW (ref 150–400)
RBC: 3.2 MIL/uL — ABNORMAL LOW (ref 4.22–5.81)
RDW: 17.1 % — ABNORMAL HIGH (ref 11.5–15.5)
WBC: 6.4 10*3/uL (ref 4.0–10.5)
nRBC: 0 % (ref 0.0–0.2)

## 2019-11-17 LAB — GLUCOSE, CAPILLARY
Glucose-Capillary: 163 mg/dL — ABNORMAL HIGH (ref 70–99)
Glucose-Capillary: 258 mg/dL — ABNORMAL HIGH (ref 70–99)
Glucose-Capillary: 231 mg/dL — ABNORMAL HIGH (ref 70–99)

## 2019-11-17 LAB — BASIC METABOLIC PANEL
Anion gap: 8 (ref 5–15)
BUN: 25 mg/dL — ABNORMAL HIGH (ref 8–23)
CO2: 24 mmol/L (ref 22–32)
Calcium: 8.8 mg/dL — ABNORMAL LOW (ref 8.9–10.3)
Chloride: 105 mmol/L (ref 98–111)
Creatinine, Ser: 0.76 mg/dL (ref 0.61–1.24)
GFR calc Af Amer: >60 mL/min (ref >60)
GFR calc non Af Amer: >60 mL/min (ref >60)
Glucose, Bld: 206 mg/dL — ABNORMAL HIGH (ref 70–99)
Potassium: 3.9 mmol/L (ref 3.5–5.1)
Sodium: 137 mmol/L (ref 135–145)

## 2019-11-17 MED ORDER — INSULIN DETEMIR 100 UNIT/ML FLEXPEN: 10.0000 [IU] | PEN_INJECTOR | Freq: Every day | SUBCUTANEOUS | 0 refills | Status: AC

## 2019-11-17 MED ORDER — BLOOD GLUCOSE METER KIT: PACK | 0 refills | Status: AC

## 2019-11-17 MED ORDER — PREDNISONE 20 MG PO TABS: 60.0000 mg | ORAL_TABLET | Freq: Every day | ORAL | 0 refills | Status: AC

## 2019-11-17 MED ORDER — INSULIN PEN NEEDLE 31G X 5 MM MISC: 0 refills | Status: AC

## 2019-11-17 MED ORDER — PANTOPRAZOLE SODIUM 40 MG PO TBEC: 40.0000 mg | DELAYED_RELEASE_TABLET | Freq: Every day | ORAL | 0 refills | Status: DC

## 2019-11-17 MED ORDER — INSULIN STARTER KIT- PEN NEEDLES (ENGLISH)
1.0000 | Freq: Once | Status: AC
  Administered 2019-11-17: 1
  Filled 2019-11-17: qty 1

## 2019-11-17 NOTE — Care Management Important Message (Signed)
Important Message  Patient Details IM Letter given to Roque Lias SW Case Manager to present to the Patient Name: Dean Dixon MRN: [id number] Date of Birth: 08/25/1950   Medicare Important Message Given:  Yes     Kerin Salen 11/17/2019, 9:52 AM

## 2019-11-17 NOTE — Progress Notes (Signed)
PT Cancellation Note  Patient Details Name: Dean Dixon MRN: [id number] DOB: [birth year]   Cancelled Treatment:    Reason Eval/Treat Not Completed: PT screened, no needs identified, will sign off(Pt is resting in bed at arrival. reports he has been mobilizing in room without assist and walked up/down the hallway with RN yesterday, no device/assist needed. Denies falls and feels he remains at independent level. Pt does not require skilled Acute PT services, will sign off. Please re-consult if there is a change in functional status.)   Verner Mould, DPT Physical Therapist with [facility] [phone]  11/17/2019 2:18 PM

## 2019-11-17 NOTE — Discharge Summary (Signed)
Physician Discharge Summary  Dean Dixon TUU:[id number] DOB: 1951/04/22 DOA: 11/13/2019  PCP: Leonides Sake, MD  Admit date: 11/13/2019 Discharge date: 11/17/2019  Admitted From: Home Disposition: Home  Recommendations for Outpatient Follow-up:  1. Follow up with PCP in 1 week 2. Follow up with medical oncology 3. Follow up with pulmonology 4. Insulin for the next 5 days while on steroid burst; check blood sugar 4 times daily 5. Please obtain BMP/CBC in one week 6. Please follow up on the following pending results: Hypersensitivity pneumonitis labs  Home Health: None Equipment/Devices: None  Discharge Condition: Stable CODE STATUS: Full code Diet recommendation: Heart healthy/Carb modified   Brief/Interim Summary:  Admission HPI written by Vianne Bulls, MD   Chief Complaint: SOB   HPI: Dean Dixon is a 69 y.o. male with medical history significant for mantle cell lymphoma, type 2 diabetes mellitus, and hypertension, now presenting to the emergency department for evaluation of shortness of breath.  Patient reports progressive dyspnea for close to 2 weeks now, felt as though he was having fevers early in the course, has not been coughing much, denies any leg swelling or tenderness, denies chest pain, but reports recent development of orthopnea.  Patient states that he was scheduled for chemotherapy infusion on 11/10/2019 but this was canceled due to the current illness.  With his symptoms continuing to worsen and unable to perform any of his usual activities without significant dyspnea, he came into the ED for evaluation. ROS positive for chronic loose stools.   ED Course: Upon arrival to the ED, patient is found to be afebrile, saturating 81% on room air while at rest, slightly tachycardic, tachypneic, and with stable blood pressure.  Chest x-ray with patchy hazy opacities bilaterally.  CTA chest is limited but negative for PE and notable for diffuse bilateral hazy  groundglass opacities that could reflect atypical infection or edema.  Chemistry panel with glucose 197.  CBC with stable normocytic anemia.  Lactic acid reassuringly normal.  Troponin negative x2 and BNP is also normal.  Blood cultures were collected in the ED and the patient was treated with vancomycin, cefepime, and azithromycin.  He is requiring 5 L/min of supplemental oxygen.    Hospital course:  Acute respiratory failure with hypoxia Likely adverse affect from Rituxin. Transthoracic Echocardiogram significant for normal EF and no valvular stenosis. Patient with significant improvement from admission but still requiring oxygen. Patient started on Vancomycin, Cefepime, azithromycin and Solu-medrol for treatment. Vancomycin discontinued. Pulmonology consulted and recommend outpatient follow-up. Patient weaned to room air successfully. No PT recommendations. Discharged with prednisone burst per oncology recommendations.  Atypical pneumonia Ruled out. Discontinued antibiotics prior to discharge.  Anemia Baseline hemoglobin appears to be 9-10. Patient presented with a hemoglobin of 8.9 with a drop down to 7.7. He received 1 unit of PRBC. Hemoglobin stable. No evidence of bleeding.  Thrombocytopenia Mild and stable. Improved slightly. Appears likely transient.  Mantle cell lymphoma Patient currently receiving immunotherapy. Oncology on board.  Diabetes mellitus, type 2 Patient is on metformin and glipizide as an outpatient. Blood sugar uncontrolled during hospitalization in setting of holding home medications and initiation of IV steroids. Patient started on Levemir daily in addition to mealtime coverage and SSI blood coverage with continued uncontrolled blood sugar. Discharge on Levemir while on steroids.  Essential hypertension Continue Ramipril  Diarrhea Given loperamide  Respiratory alkalosis with compensatory metabolic acidosis Metabolic acidosis is  stabile.  Hyperphosphatemia Phosphorus of 4.7 on admission which has now resolved.  GERD Continue Protonix  Diastolic dysfunction No clinical evidence of heart failure.  Obestiy Body mass index is 34.36 kg/m.   Discharge Diagnoses:  Principal Problem:   Acute respiratory failure with hypoxia ([place]) Active Problems:   HTN (hypertension)   Mantle cell lymphoma of lymph nodes of multiple regions (HCC)   Pneumonia   Diabetes mellitus without complication ([place])   Bilateral pulmonary infiltrates on CXR   ILD (interstitial lung disease) [facility])    Discharge Instructions  Discharge Instructions    Informed Consent Details: Physician/Practitioner Attestation; Transcribe to consent form and obtain patient signature   Complete by: Nov 14, 2019    Physician/Practitioner attestation of informed consent for blood and or blood product transfusion: I, the physician/practitioner, attest that I have discussed with the patient the benefits, risks, side effects, alternatives, likelihood of achieving goals and potential problems during recovery for the procedure that I have provided informed consent.   Product(s): All Product(s)   Care order/instruction   Complete by: As directed    Transfuse Parameters   Type and screen   Complete by: As directed    [place]      Allergies as of 11/17/2019      Reactions   Ivp Dye [iodinated Diagnostic Agents] Shortness Of Breath   "allergic to CT scan dye", took benadryl with last scan and tolerated well      Medication List    STOP taking these medications   acyclovir 400 MG tablet Commonly known as: ZOVIRAX   dexamethasone 4 MG tablet Commonly known as: DECADRON   glipiZIDE 10 MG 24 hr tablet Commonly known as: GLUCOTROL XL     TAKE these medications   allopurinol 100 MG tablet Commonly known as: ZYLOPRIM Take 1 tablet (100 mg total) by mouth 2 (two) times daily.   amLODipine 10 MG tablet Commonly known as:  NORVASC Take 10 mg by mouth daily.   blood glucose meter kit and supplies Check blood sugar 4 times daily (Before breakfast, lunch, dinner, bedtime). (FOR ICD-10 E10.9, E11.9).   CANNABIDIOL PO Take 30 mg by mouth daily.   COQ-10 PO Take 1 capsule by mouth daily. Takes CoQ 10 w/cinnamon   GLUCOSAMINE CHONDR COMPLEX PO Take 1 tablet by mouth daily.   insulin detemir 100 UNIT/ML FlexPen Commonly known as: LEVEMIR Inject 10 Units into the skin at bedtime for 5 days.   Insulin Pen Needle 31G X 5 MM Misc BD Pen Needles- brand specific Inject insulin via insulin pen 6 x daily   LORazepam 0.5 MG tablet Commonly known as: Ativan Take 1 tablet (0.5 mg total) by mouth every 6 (six) hours as needed (Nausea or vomiting).   metFORMIN 500 MG 24 hr tablet Commonly known as: GLUCOPHAGE-XR Take 500 mg by mouth in the morning and at bedtime.   multivitamin tablet Take 1 tablet by mouth daily.   ondansetron 8 MG tablet Commonly known as: Zofran Take 1 tablet (8 mg total) by mouth 2 (two) times daily as needed for refractory nausea / vomiting. Start on day 2 after bendamustine chemotherapy.   pantoprazole 40 MG tablet Commonly known as: PROTONIX Take 1 tablet (40 mg total) by mouth daily at 12 noon for 4 days. Start taking on: Nov 18, 2019   predniSONE 20 MG tablet Commonly known as: DELTASONE Take 3 tablets (60 mg total) by mouth daily with breakfast for 4 days. Start taking on: Nov 18, 2019   prochlorperazine 10 MG tablet Commonly known as: COMPAZINE  Take 1 tablet (10 mg total) by mouth every 6 (six) hours as needed (Nausea or vomiting).   ramipril 10 MG capsule Commonly known as: ALTACE Take 10 mg by mouth daily.   Vitamin C 500 MG Caps Take 1 capsule by mouth daily.      Follow-up Information    Martyn Ehrich, NP Follow up on 12/02/2019.   Specialty: Pulmonary Disease Why: Appt at 3:30 PM.  Please arrive at 3:15 PM for check in Contact information: [place] [address] [phone]        Hamrick, Lorin Mercy, MD. Schedule an appointment as soon as possible for a visit in 1 week(s).   Specialty: Family Medicine Why: Diabetes/Hyperglycemia Contact information: [place] [address] [phone]          Allergies  Allergen Reactions  . Ivp Dye [Iodinated Diagnostic Agents] Shortness Of Breath    "allergic to CT scan dye", took benadryl with last scan and tolerated well    Consultations:  Medical Oncology  Pulmonology   Procedures/Studies: DG Chest 2 View  Result Date: 11/10/2019 CLINICAL DATA:  Mantle cell lymphoma.  Fever and shortness of breath EXAM: CHEST - 2 VIEW COMPARISON:  Oct 23, 2014 chest radiograph and chest CT FINDINGS: The lungs are clear. The heart size and pulmonary vascularity are normal. No adenopathy. There is degenerative change in the thoracic spine. IMPRESSION: Lungs clear.  Cardiac silhouette within normal limits. Electronically Signed   By: Lowella Grip III M.D.   On: 11/10/2019 10:03   CT Angio Chest PE W and/or Wo Contrast  Result Date: 11/14/2019 CLINICAL DATA:  Shortness of breath. EXAM: CT ANGIOGRAPHY CHEST WITH CONTRAST TECHNIQUE: Multidetector CT imaging of the chest was performed using the standard protocol during bolus administration of intravenous contrast. Multiplanar CT image reconstructions and MIPs were obtained to evaluate the vascular anatomy. CONTRAST:  1107m OMNIPAQUE IOHEXOL 350 MG/ML SOLN COMPARISON:  PET-CT dated September 19, 2018 FINDINGS: Cardiovascular: Evaluation is limited by respiratory motion artifact. Given this limitation, there was no acute pulmonary embolism detected.The heart size is normal. There is no significant pericardial effusion. There is no evidence for thoracic aortic dissection or aneurysm. Mediastinum/Nodes: --No mediastinal or hilar lymphadenopathy. --No axillary lymphadenopathy. --No supraclavicular lymphadenopathy.  --Normal thyroid gland. --The esophagus is unremarkable Lungs/Pleura: There are hazy bilateral ground-glass airspace opacities. There is some interlobular septal thickening. There are trace bilateral pleural effusions. There is atelectasis at the lung bases. There is no pneumothorax. The trachea is unremarkable. Upper Abdomen: Large cyst is again noted in the patient's left hepatic lobe. There is persistent splenomegaly. There is a heterogeneous appearance of the splenic parenchyma which is felt to be secondary to contrast timing in the absence of left upper quadrant abdominal pain. Musculoskeletal: No chest wall abnormality. No acute or significant osseous findings. Review of the MIP images confirms the above findings. IMPRESSION: 1. Evaluation for pulmonary emboli is limited by respiratory motion artifact. Given this limitation, no acute pulmonary embolism was detected. 2. There are diffuse bilateral hazy ground-glass airspace opacities which may represent an atypical infectious process or pulmonary edema. 3. Splenomegaly. Electronically Signed   By: CConstance HolsterM.D.   On: 11/14/2019 01:36   DG CHEST PORT 1 VIEW  Result Date: 11/17/2019 CLINICAL DATA:  Respiratory failure EXAM: PORTABLE CHEST 1 VIEW COMPARISON:  Radiograph 11/16/2019, CT 11/14/2019 FINDINGS: Persistently low lung volumes with areas of basilar atelectasis. More diffuse faint hazy opacities throughout  both lungs could reflect multifocal infection or edema. No visible pneumothorax or effusion. Cardiomediastinal contours are stable. No acute osseous or soft tissue abnormality. Degenerative changes are present in the imaged spine and shoulders. Nasal cannula overlies the chest wall. IMPRESSION: Persistently low lung volumes with areas basilar atelectasis Faint hazy opacities throughout both lungs similar to comparison radiograph, could reflect multifocal infection or edema. Electronically Signed   By: Lovena Le M.D.   On: 11/17/2019 06:17    DG CHEST PORT 1 VIEW  Result Date: 11/16/2019 CLINICAL DATA:  Short of breath EXAM: PORTABLE CHEST 1 VIEW COMPARISON:  11/15/2019 FINDINGS: Improved aeration lungs with decreased bibasilar airspace disease likely atelectasis. No heart failure or effusion. No focal pneumonia. IMPRESSION: Improved aeration of the lungs with decrease in bibasilar atelectasis/infiltrate. Electronically Signed   By: Franchot Gallo M.D.   On: 11/16/2019 09:13   DG CHEST PORT 1 VIEW  Result Date: 11/15/2019 CLINICAL DATA:  69 year old male with shortness of breath. EXAM: PORTABLE CHEST 1 VIEW COMPARISON:  CTA chest 11/14/2019 and earlier. FINDINGS: Portable AP semi upright view at 0520 hours. Continued indistinct/interstitial basilar predominant bilateral pulmonary opacity, stable from the CTA yesterday, mildly increased at the left base since 11/13/2019. Continued low lung volumes. Mediastinal contours remain normal. Visualized tracheal air column is within normal limits. No pneumothorax or pleural effusion. Paucity of bowel gas in the upper abdomen. No acute osseous abnormality identified. IMPRESSION: Continued low lung volumes with basilar predominant indistinct/interstitial opacity which could reflect a combination of atelectasis and infection versus edema. No pleural effusion. Electronically Signed   By: Genevie Ann M.D.   On: 11/15/2019 08:30   DG Chest Port 1 View  Result Date: 11/13/2019 CLINICAL DATA:  Initial evaluation for acute shortness of breath, weakness. EXAM: PORTABLE CHEST 1 VIEW COMPARISON:  Prior radiograph from 11/10/2019. FINDINGS: Exaggeration of the cardiac silhouette related AP technique and low lung volumes. Mediastinal silhouette normal. Lungs hypoinflated. Patchy and hazy opacity seen within the mid and lower lungs bilaterally, which could reflect atelectasis or possibly infiltrates. Underlying mild pulmonary interstitial congestion. No appreciable pleural effusion. No pneumothorax. No acute osseous  finding. IMPRESSION: 1. Patchy and hazy opacity within the mid and lower lungs bilaterally, which could reflect atelectasis or possibly infiltrates. 2. Underlying mild pulmonary interstitial congestion. Electronically Signed   By: Jeannine Boga M.D.   On: 11/13/2019 21:36   ECHOCARDIOGRAM COMPLETE  Result Date: 11/14/2019    ECHOCARDIOGRAM REPORT   Patient Name:   Dean Dixon Date of Exam: 11/14/2019 Medical Rec #:  [id number]    Height:       64.0 in Accession #:    [id number]   Weight:       195.0 lb Date of Birth:  02/26/1951   BSA:          1.935 m Patient Age:    69 years     BP:           158/72 mmHg Patient Gender: M            HR:           90 bpm. Exam Location:  Inpatient Procedure: 2D Echo, Cardiac Doppler and Color Doppler Indications:    Dyspnea 786.09 / R06.00  History:        Patient has no prior history of Echocardiogram examinations.                 Risk Factors:Hypertension, Diabetes and Non-Smoker.  Sonographer:  Vickie Epley RDCS Referring Phys: [id number] [place]  1. Left ventricular ejection fraction, by estimation, is 50 to 55%. The left ventricle has low normal function. The left ventricle has no regional wall motion abnormalities. Left ventricular diastolic parameters are consistent with Grade I diastolic dysfunction (impaired relaxation).  2. Right ventricular systolic function is normal. The right ventricular size is normal.  3. The mitral valve is normal in structure. No evidence of mitral valve regurgitation. No evidence of mitral stenosis.  4. The aortic valve is normal in structure. Aortic valve regurgitation is not visualized. No aortic stenosis is present. FINDINGS  Left Ventricle: Left ventricular ejection fraction, by estimation, is 50 to 55%. The left ventricle has low normal function. The left ventricle has no regional wall motion abnormalities. The left ventricular internal cavity size was normal in size. There is no left ventricular hypertrophy. Left  ventricular diastolic parameters are consistent with Grade I diastolic dysfunction (impaired relaxation). Right Ventricle: The right ventricular size is normal. No increase in right ventricular wall thickness. Right ventricular systolic function is normal. Left Atrium: Left atrial size was normal in size. Right Atrium: Right atrial size was normal in size. Pericardium: There is no evidence of pericardial effusion. Mitral Valve: The mitral valve is normal in structure. No evidence of mitral valve regurgitation. No evidence of mitral valve stenosis. Tricuspid Valve: The tricuspid valve is normal in structure. Tricuspid valve regurgitation is trivial. Aortic Valve: The aortic valve is normal in structure. Aortic valve regurgitation is not visualized. No aortic stenosis is present. Pulmonic Valve: The pulmonic valve was grossly normal. Pulmonic valve regurgitation is not visualized. Aorta: The aortic root and ascending aorta are structurally normal, with no evidence of dilitation. IAS/Shunts: The atrial septum is grossly normal.  LEFT VENTRICLE PLAX 2D LVIDd:         4.60 cm      Diastology LVIDs:         3.00 cm      LV e' lateral:   9.90 cm/s LV PW:         0.90 cm      LV E/e' lateral: 7.2 LV IVS:        0.90 cm      LV e' medial:    6.42 cm/s LVOT diam:     1.90 cm      LV E/e' medial:  11.1 LV SV:         58 LV SV Index:   30 LVOT Area:     2.84 cm  LV Volumes (MOD) LV vol d, MOD A2C: 101.0 ml LV vol d, MOD A4C: 88.6 ml LV vol s, MOD A2C: 41.0 ml LV vol s, MOD A4C: 41.8 ml LV SV MOD A2C:     60.0 ml LV SV MOD A4C:     88.6 ml LV SV MOD BP:      52.8 ml RIGHT VENTRICLE RV S prime:     17.70 cm/s TAPSE (M-mode): 2.7 cm LEFT ATRIUM             Index       RIGHT ATRIUM          Index LA diam:        3.40 cm 1.76 cm/m  RA Area:     9.02 cm LA Vol (A2C):   27.9 ml 14.42 ml/m RA Volume:   14.80 ml 7.65 ml/m LA Vol (A4C):   35.0 ml 18.08 ml/m LA Biplane Vol: 33.8 ml  17.46 ml/m  AORTIC VALVE LVOT Vmax:   118.00 cm/s  LVOT Vmean:  76.800 cm/s LVOT VTI:    0.204 m  AORTA Ao Root diam: 3.30 cm MITRAL VALVE MV Area (PHT): 3.50 cm    SHUNTS MV Decel Time: 217 msec    Systemic VTI:  0.20 m MV E velocity: 71.10 cm/s  Systemic Diam: 1.90 cm MV A velocity: 78.80 cm/s MV E/A ratio:  0.90 Mertie Moores MD Electronically signed by Mertie Moores MD Signature Date/Time: 11/14/2019/4:33:03 PM    Final       TRANSTHORACIC ECHOCARDIOGRAM (11/14/2019) IMPRESSIONS    1. Left ventricular ejection fraction, by estimation, is 50 to 55%. The  left ventricle has low normal function. The left ventricle has no regional  wall motion abnormalities. Left ventricular diastolic parameters are  consistent with Grade I diastolic  dysfunction (impaired relaxation).  2. Right ventricular systolic function is normal. The right ventricular  size is normal.  3. The mitral valve is normal in structure. No evidence of mitral valve  regurgitation. No evidence of mitral stenosis.  4. The aortic valve is normal in structure. Aortic valve regurgitation is  not visualized. No aortic stenosis is present.   Subjective: No issues today. No dyspnea. Feels good.  Discharge Exam: Vitals:   11/17/19 0822 11/17/19 1519  BP: 125/70 139/80  Pulse:  80  Resp:  (!) 22  Temp:  98.3 F (36.8 C)  SpO2:  94%   Vitals:   11/17/19 0536 11/17/19 0822 11/17/19 0900 11/17/19 1519  BP: 124/69 125/70  139/80  Pulse: (!) 58   80  Resp: 17   (!) 22  Temp: (!) 97.5 F (36.4 C)   98.3 F (36.8 C)  TempSrc: Oral   Oral  SpO2: 99%   94%  Weight:   88.3 kg   Height:        General: Pt is alert, awake, not in acute distress Cardiovascular: RRR, S1/S2 +, no rubs, no gallops Respiratory: mild rales at bases, no wheezing, no rhonchi Abdominal: Soft, NT, ND, bowel sounds + Extremities: no edema, no cyanosis    The results of significant diagnostics from this hospitalization (including imaging, microbiology, ancillary and laboratory) are listed  below for reference.     Microbiology: Recent Results (from the past 240 hour(s))  SARS Coronavirus 2 by RT PCR (hospital order, performed in [facility] hospital lab) Nasopharyngeal Nasopharyngeal Swab     Status: None   Collection Time: 11/13/19  9:30 PM   Specimen: Nasopharyngeal Swab  Result Value Ref Range Status   SARS Coronavirus 2 NEGATIVE NEGATIVE Final    Comment: (NOTE) SARS-CoV-2 target nucleic acids are NOT DETECTED. The SARS-CoV-2 RNA is generally detectable in upper and lower respiratory specimens during the acute phase of infection. The lowest concentration of SARS-CoV-2 viral copies this assay can detect is 250 copies / mL. A negative result does not preclude SARS-CoV-2 infection and should not be used as the sole basis for treatment or other patient management decisions.  A negative result may occur with improper specimen collection / handling, submission of specimen other than nasopharyngeal swab, presence of viral mutation(s) within the areas targeted by this assay, and inadequate number of viral copies (<250 copies / mL). A negative result must be combined with clinical observations, patient history, and epidemiological information. Fact Sheet for Patients:   StrictlyIdeas.no Fact Sheet for Healthcare Providers: BankingDealers.co.za This test is not yet approved or cleared  by the [place] FDA and  has been authorized for detection and/or diagnosis of SARS-CoV-2 by FDA under an Emergency Use Authorization (EUA).  This EUA will remain in effect (meaning this test can be used) for the duration of the COVID-19 declaration under Section 564(b)(1) of the Act, 21 U.S.C. section 360bbb-3(b)(1), unless the authorization is terminated or revoked sooner. Performed at [facility], [place] [address] [place], [place] [address]   Blood culture (routine x 2)     Status: None (Preliminary result)    Collection Time: 11/13/19  9:59 PM   Specimen: BLOOD  Result Value Ref Range Status   Specimen Description   Final    BLOOD RIGHT ANTECUBITAL Performed at [place] [address], [place], [place] [address]    Special Requests   Final    BOTTLES DRAWN AEROBIC AND ANAEROBIC Blood Culture adequate volume Performed at [place] [address], [place], [place] [address]    Culture   Final    NO GROWTH 3 DAYS Performed at [facility], [place] [address], [place], [place] [address]    Report Status PENDING  Incomplete  Blood culture (routine x 2)     Status: None (Preliminary result)   Collection Time: 11/14/19 12:12 AM   Specimen: BLOOD RIGHT HAND  Result Value Ref Range Status   Specimen Description   Final    BLOOD RIGHT HAND Performed at [place] [address], [place], [place] [address]    Special Requests   Final    BOTTLES DRAWN AEROBIC AND ANAEROBIC Blood Culture results may not be optimal due to an inadequate volume of blood received in culture bottles Performed at [place] [address] [place], [place] [address]    Culture   Final    NO GROWTH 3 DAYS Performed at [facility], [place] [address] [place], [place] [address]    Report Status PENDING  Incomplete  MRSA PCR Screening     Status: None   Collection Time: 11/14/19  9:31 AM   Specimen: Nasal Mucosa; Nasopharyngeal  Result Value Ref Range Status   MRSA by PCR NEGATIVE NEGATIVE Final    Comment:        The GeneXpert MRSA Assay (FDA approved for NASAL specimens only), is one component of a comprehensive MRSA colonization surveillance program. It is not intended to diagnose MRSA infection nor to guide or monitor treatment for MRSA infections. Performed at [facility], [place] [address] [place], [place] [address]   Respiratory Panel by PCR     Status: None   Collection Time: 11/14/19   8:30 PM   Specimen: Nasopharyngeal Swab; Respiratory  Result Value Ref Range Status   Adenovirus NOT DETECTED NOT DETECTED Final   Coronavirus 229E NOT DETECTED NOT DETECTED Final    Comment: (NOTE) The Coronavirus on the Respiratory Panel, DOES NOT test for the novel  Coronavirus (2019 nCoV)    Coronavirus HKU1 NOT DETECTED NOT DETECTED Final   Coronavirus NL63 NOT DETECTED NOT DETECTED Final   Coronavirus OC43 NOT DETECTED NOT DETECTED Final   Metapneumovirus NOT DETECTED NOT DETECTED Final   Rhinovirus / Enterovirus NOT DETECTED NOT DETECTED Final   Influenza A NOT DETECTED NOT DETECTED Final   Influenza B NOT DETECTED NOT DETECTED Final   Parainfluenza Virus 1 NOT DETECTED NOT DETECTED Final   Parainfluenza Virus 2 NOT DETECTED NOT DETECTED Final   Parainfluenza Virus 3 NOT DETECTED NOT DETECTED  Final   Parainfluenza Virus 4 NOT DETECTED NOT DETECTED Final   Respiratory Syncytial Virus NOT DETECTED NOT DETECTED Final   Bordetella pertussis NOT DETECTED NOT DETECTED Final   Chlamydophila pneumoniae NOT DETECTED NOT DETECTED Final   Mycoplasma pneumoniae NOT DETECTED NOT DETECTED Final    Comment: Performed at [facility], [place] [address] [place], [place] [address]     Labs: BNP (last 3 results) Recent Labs    11/10/19 1257 11/13/19 2144  BNP 41.9 08.8   Basic Metabolic Panel: Recent Labs  Lab 11/13/19 2144 11/13/19 2144 11/13/19 2155 11/14/19 0613 11/15/19 0609 11/16/19 0525 11/17/19 0554  NA 137   < > 137 136 138 135 137  K 3.6   < > 3.6 4.1 4.5 4.4 3.9  CL 106   < > 105 106 108 107 105  CO2 20*  --   --  20* 20* 20* 24  GLUCOSE 197*   < > 190* 244* 315* 328* 206*  BUN 14   < > '12 14 15 22 '$ 25*  CREATININE 0.89   < > 0.80 0.78 0.73 0.78 0.76  CALCIUM 8.3*  --   --  7.8* 8.5* 8.8* 8.8*  MG  --   --   --   --  2.3 2.4  --   PHOS  --   --   --   --  4.7* 3.5  --    < > = values in this interval not displayed.   Liver Function Tests: Recent Labs  Lab  11/13/19 2144 11/15/19 0609 11/16/19 0525  AST '17 15 15  '$ ALT '15 14 14  '$ ALKPHOS 59 55 52  BILITOT 0.8 0.9 0.3  PROT 5.8* 5.6* 5.2*  ALBUMIN 3.3* 3.0* 2.8*   No results for input(s): LIPASE, AMYLASE in the last 168 hours. No results for input(s): AMMONIA in the last 168 hours. CBC: Recent Labs  Lab 11/13/19 2144 11/13/19 2144 11/13/19 2155 11/14/19 0613 11/15/19 0609 11/16/19 0525 11/17/19 0554  WBC 5.5  --   --  3.8* 3.5* 6.7 6.4  NEUTROABS 4.3  --   --  3.0 3.0 6.1 5.8  HGB 8.8*   < > 8.8* 7.7* 9.7* 9.2* 9.1*  HCT 27.7*   < > 26.0* 24.6* 31.8* 29.5* 29.7*  MCV 91.7  --   --  92.5 92.4 93.4 92.8  PLT 162  --   --  133* 137* 142* 127*   < > = values in this interval not displayed.   Cardiac Enzymes: No results for input(s): CKTOTAL, CKMB, CKMBINDEX, TROPONINI in the last 168 hours. BNP: Invalid input(s): POCBNP CBG: Recent Labs  Lab 11/16/19 1221 11/16/19 1650 11/16/19 2125 11/17/19 0735 11/17/19 1157  GLUCAP 344* 375* 322* 163* 258*   D-Dimer No results for input(s): DDIMER in the last 72 hours. Hgb A1c No results for input(s): HGBA1C in the last 72 hours. Lipid Profile No results for input(s): CHOL, HDL, LDLCALC, TRIG, CHOLHDL, LDLDIRECT in the last 72 hours. Thyroid function studies No results for input(s): TSH, T4TOTAL, T3FREE, THYROIDAB in the last 72 hours.  Invalid input(s): FREET3 Anemia work up No results for input(s): VITAMINB12, FOLATE, FERRITIN, TIBC, IRON, RETICCTPCT in the last 72 hours. Urinalysis    Component Value Date/Time   COLORURINE YELLOW 10/23/2014 [place] 10/23/2014 1754   LABSPEC 1.021 10/23/2014 1754   PHURINE 5.5 10/23/2014 1754   GLUCOSEU NEGATIVE 10/23/2014 1754   HGBUR NEGATIVE 10/23/2014 1754  BILIRUBINUR NEGATIVE 10/23/2014 1754   KETONESUR 15 (A) 10/23/2014 1754   PROTEINUR NEGATIVE 10/23/2014 1754   UROBILINOGEN 0.2 10/23/2014 1754   NITRITE NEGATIVE 10/23/2014 1754   LEUKOCYTESUR NEGATIVE  10/23/2014 1754   Sepsis Labs Invalid input(s): PROCALCITONIN,  WBC,  LACTICIDVEN Microbiology Recent Results (from the past 240 hour(s))  SARS Coronavirus 2 by RT PCR (hospital order, performed in [place] hospital lab) Nasopharyngeal Nasopharyngeal Swab     Status: None   Collection Time: 11/13/19  9:30 PM   Specimen: Nasopharyngeal Swab  Result Value Ref Range Status   SARS Coronavirus 2 NEGATIVE NEGATIVE Final    Comment: (NOTE) SARS-CoV-2 target nucleic acids are NOT DETECTED. The SARS-CoV-2 RNA is generally detectable in upper and lower respiratory specimens during the acute phase of infection. The lowest concentration of SARS-CoV-2 viral copies this assay can detect is 250 copies / mL. A negative result does not preclude SARS-CoV-2 infection and should not be used as the sole basis for treatment or other patient management decisions.  A negative result may occur with improper specimen collection / handling, submission of specimen other than nasopharyngeal swab, presence of viral mutation(s) within the areas targeted by this assay, and inadequate number of viral copies (<250 copies / mL). A negative result must be combined with clinical observations, patient history, and epidemiological information. Fact Sheet for Patients:   StrictlyIdeas.no Fact Sheet for Healthcare Providers: BankingDealers.co.za This test is not yet approved or cleared  by the [place] FDA and has been authorized for detection and/or diagnosis of SARS-CoV-2 by FDA under an Emergency Use Authorization (EUA).  This EUA will remain in effect (meaning this test can be used) for the duration of the COVID-19 declaration under Section 564(b)(1) of the Act, 21 U.S.C. section 360bbb-3(b)(1), unless the authorization is terminated or revoked sooner. Performed at [facility], [place] [address] [place], [place] [address]   Blood culture  (routine x 2)     Status: None (Preliminary result)   Collection Time: 11/13/19  9:59 PM   Specimen: BLOOD  Result Value Ref Range Status   Specimen Description   Final    BLOOD RIGHT ANTECUBITAL Performed at [place] [address] [place], [place] [address]    Special Requests   Final    BOTTLES DRAWN AEROBIC AND ANAEROBIC Blood Culture adequate volume Performed at [place] [address], [place], [place] [address]    Culture   Final    NO GROWTH 3 DAYS Performed at [facility], [place] [address], [place], [place] [address]    Report Status PENDING  Incomplete  Blood culture (routine x 2)     Status: None (Preliminary result)   Collection Time: 11/14/19 12:12 AM   Specimen: BLOOD RIGHT HAND  Result Value Ref Range Status   Specimen Description   Final    BLOOD RIGHT HAND Performed at [place] [address], [place], [place] [address]    Special Requests   Final    BOTTLES DRAWN AEROBIC AND ANAEROBIC Blood Culture results may not be optimal due to an inadequate volume of blood received in culture bottles Performed at [place] [address] [place], [place] [address]    Culture   Final    NO GROWTH 3 DAYS Performed at [facility], [place] [address], [place],  [address]    Report Status PENDING  Incomplete  MRSA PCR Screening  Status: None   Collection Time: 11/14/19  9:31 AM   Specimen: Nasal Mucosa; Nasopharyngeal  Result Value Ref Range Status   MRSA by PCR NEGATIVE NEGATIVE Final    Comment:        The GeneXpert MRSA Assay (FDA approved for NASAL specimens only), is one component of a comprehensive MRSA colonization surveillance program. It is not intended to diagnose MRSA infection nor to guide or monitor treatment for MRSA infections. Performed at [facility], [place] [address], [place], [place] [address]   Respiratory Panel by  PCR     Status: None   Collection Time: 11/14/19  8:30 PM   Specimen: Nasopharyngeal Swab; Respiratory  Result Value Ref Range Status   Adenovirus NOT DETECTED NOT DETECTED Final   Coronavirus 229E NOT DETECTED NOT DETECTED Final    Comment: (NOTE) The Coronavirus on the Respiratory Panel, DOES NOT test for the novel  Coronavirus (2019 nCoV)    Coronavirus HKU1 NOT DETECTED NOT DETECTED Final   Coronavirus NL63 NOT DETECTED NOT DETECTED Final   Coronavirus OC43 NOT DETECTED NOT DETECTED Final   Metapneumovirus NOT DETECTED NOT DETECTED Final   Rhinovirus / Enterovirus NOT DETECTED NOT DETECTED Final   Influenza A NOT DETECTED NOT DETECTED Final   Influenza B NOT DETECTED NOT DETECTED Final   Parainfluenza Virus 1 NOT DETECTED NOT DETECTED Final   Parainfluenza Virus 2 NOT DETECTED NOT DETECTED Final   Parainfluenza Virus 3 NOT DETECTED NOT DETECTED Final   Parainfluenza Virus 4 NOT DETECTED NOT DETECTED Final   Respiratory Syncytial Virus NOT DETECTED NOT DETECTED Final   Bordetella pertussis NOT DETECTED NOT DETECTED Final   Chlamydophila pneumoniae NOT DETECTED NOT DETECTED Final   Mycoplasma pneumoniae NOT DETECTED NOT DETECTED Final    Comment: Performed at [facility], [place]. [address] [place], [place] [address]     Time coordinating discharge: 35 minutes  SIGNED:   Cordelia Poche, MD [facility] 11/17/2019, 3:19 PM

## 2019-11-17 NOTE — TOC Benefit Eligibility Note (Signed)
Transition of Care [facility]) Benefit Eligibility Note    Patient Details  Name: Dean Dixon MRN: [id number] Date of Birth: January 30, 1951   Medication/Dose: 10 units 30 day supply  Covered?: Yes  Tier: Other  Prescription Coverage Preferred Pharmacy: Local pharmacy  Spoke with Person/Company/Phone Number:: [facility] automated system [phone]  Co-Pay: $55.07  Prior Approval: No  Deductible: Met       Kerin Salen Phone Number: 11/17/2019, 3:20 PM

## 2019-11-17 NOTE — Progress Notes (Signed)
NAME:  Dean Dixon, MRN:  [id number], DOB:  1950-08-14, LOS: 3 ADMISSION DATE:  11/13/2019, CONSULTATION DATE:  11/15/19 REFERRING MD:  Dr. Alfredia Ferguson, CHIEF COMPLAINT:  Abnormal CT Chest    Brief History   69 y/o M with Mantle Cell Lymphoma admitted on 5/23 with 2 week history of shortness of breath.  CTA of the chest negative for PE but showed diffuse bilateral ground glass opacities.    History of present illness   69 y/o M, with Mantle Cell Lymphoma (diagnosed in 05/2019) who presented 5/23 to [facility] with reports of shortness of breath.    The patient reported on presentation that he had been feeling more short of breath for two weeks.   He had fevers for several days around the week of 4/28 but resolved with aspirin.  He was planned for chemotherapy on 5/20 but missed due to dyspnea.  His symptoms continued to worsen prompting ER evaluation on 5/23.  On initial ER assessment he was noted to have room air saturations of 81%, tachycardia and tachypnea.  Initial CXR was concerning for patchy bilateral mid and lower opacities, mild interstitial congestion. COVID testing was negative.  To further evaluate hypoxemia a CTA of the chest was completed which was negative for pulmonary embolism, motion degraded, diffuse bilateral hazy ground-glass airspace opacities and splenomegaly noted.  The patient was admitted for further evaluation.  He was treated empirically with azithromycin and cefepime for possible pulmonary infection.  He continued to require 5L O2 to maintain saturations.  ECHO was assessed which showed normal LV function and grade I diastolic dysfunction. The patient was seen by Oncology and given 1 unit PRBC for Hgb of 7.7.  He was started on high dose steroids ('125mg'$  solumedrol Q6) as well.  O2 weaned to 2L.    PCCM consulted for evaluation of bilateral ground glass opacities.   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  Denies difficulty swallowing / choking on foods.  Notes he has lost 40  lbs since his diagnosis.  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  He has traveled all over the world for work, noted travel to [place] where he picked up a "parasite" that he was treated for at [facility].  He is a lifelong never smoker / no vaping. 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  He self identified he had lymphocytosis and asked his PCP to order a CBC which led to the identification of Mantle Cell.  He is treated by Dr. Irene Limbo with Bendamustine + Rituxan.  He notes he has significant rigors with rituxan administration requiring administration of demerol.    Past Medical History  Mantle Cell Lymphoma  HTN DM II   Significant Hospital Events   5/23 Admit  5/25 PCCM consulted   Consults:  Oncology  Procedures:    Significant Diagnostic Tests:   CTA Chest 5/24 >> motion degraded, diffuse bilateral hazy ground-glass airspace opacities and splenomegaly   ECHO 5/24 >> LVEF 50-55%, LV has low normal function, no RWMA, grade I diastolic dysfunction, RV systolic function normal, LA/RA normal in size  Micro Data:  COVID 5/23 >> negative  MRSA PCR 5/24 >> negative  RVP 5/24 >> negative  BCx2 5/23 >>   Antimicrobials:  Vancomycin 5/24 x1  Azithro 5/24 >>  Cefepime 5/24 >>  Interim history/subjective:  Feels better.  On room air.  Objective   Blood pressure 125/70,  pulse (!) 58, temperature (!) 97.5 F (36.4 C), temperature source Oral, resp. rate 17, height '5\' 4"'$  (1.626 m), weight 90.8 kg, SpO2 99 %.        Intake/Output Summary (Last 24 hours) at 11/17/2019 0954 Last data filed at 11/16/2019 1700 Gross per 24 hour  Intake 200 ml  Output 600 ml  Net -400 ml   Filed Weights   11/13/19 2131 11/15/19 0500 11/16/19 0500  Weight: 88.5 kg 91.1 kg 90.8 kg    Examination:  General - alert Eyes - pupils reactive ENT - no sinus tenderness, no stridor Cardiac - regular rate/rhythm, no murmur Chest - equal  breath sounds b/l, no wheezing or rales Abdomen - soft, non tender, + bowel sounds Extremities - no cyanosis, clubbing, or edema Skin - no rashes Neuro - normal strength, moves extremities, follows commands Psych - normal mood and behavior  Resolved Hospital Problem list      Assessment & Plan:   Acute hypoxic respiratory failure with bilateral ground glass opacities on CT chest. - most likely from rituxan induced pulmonary toxicity - significant improvement with steroids; tapering schedule in place per oncology - infection seems less likely; defer to hospitalist team to determine duration of antibiotics - continue PPI while on high dose steroids - f/u hypersensitivity pneumonitis panel from 11/15/19  Has pulmonary office follow up with Geraldo Pitter on 12/02/19.    PCCM will sign off.  Please call if additional help needed while he is in hospital.  Best practice:  Diet: heart healthy, carb modified DVT prophylaxis: lovenox  GI prophylaxis: PPI  Mobility: as tolerated  Code Status: Full Code   Labs    CMP Latest Ref Rng & Units 11/17/2019 11/16/2019 11/15/2019  Glucose 70 - 99 mg/dL 206(H) 328(H) 315(H)  BUN 8 - 23 mg/dL 25(H) 22 15  Creatinine 0.61 - 1.24 mg/dL 0.76 0.78 0.73  Sodium 135 - 145 mmol/L 137 135 138  Potassium 3.5 - 5.1 mmol/L 3.9 4.4 4.5  Chloride 98 - 111 mmol/L 105 107 108  CO2 22 - 32 mmol/L 24 20(L) 20(L)  Calcium 8.9 - 10.3 mg/dL 8.8(L) 8.8(L) 8.5(L)  Total Protein 6.5 - 8.1 g/dL - 5.2(L) 5.6(L)  Total Bilirubin 0.3 - 1.2 mg/dL - 0.3 0.9  Alkaline Phos 38 - 126 U/L - 52 55  AST 15 - 41 U/L - 15 15  ALT 0 - 44 U/L - 14 14    CBC Latest Ref Rng & Units 11/17/2019 11/16/2019 11/15/2019  WBC 4.0 - 10.5 K/uL 6.4 6.7 3.5(L)  Hemoglobin 13.0 - 17.0 g/dL 9.1(L) 9.2(L) 9.7(L)  Hematocrit 39.0 - 52.0 % 29.7(L) 29.5(L) 31.8(L)  Platelets 150 - 400 K/uL 127(L) 142(L) 137(L)   Signature:  Chesley Mires, MD [place] Pager - [phone] 11/17/2019, 9:58 AM

## 2019-11-17 NOTE — Discharge Instructions (Signed)
Dean Dixon,  You were in the hospital with respiratory failure from inflammation from Rituxin. This has improved with steroids. While on steroids your blood sugar was elevated. You will be discharged with insulin for a few days. Please follow-up with your primary care physician with regard to this. Please also follow-up with the pulmonologist as an outpatient.

## 2019-11-17 NOTE — Progress Notes (Signed)
Inpatient Diabetes Program Recommendations  AACE/ADA: New Consensus Statement on Inpatient Glycemic Control (2015)  Target Ranges:  Prepandial:   less than 140 mg/dL      Peak postprandial:   less than 180 mg/dL (1-2 hours)      Critically ill patients:  140 - 180 mg/dL   Lab Results  Component Value Date   GLUCAP 163 (H) 11/17/2019   HGBA1C 5.4 11/14/2019    Review of Glycemic Control  Post-prandial blood sugars in 300s on 5/26. May benefit from increasing Novolog to 5 units tidwc for meal coverage insulin.  Inpatient Diabetes Program Recommendations:     Increase Novolog to 5 units tidwc for meal coverage insulin if pt eats > 50% meal.  Will continue to follow glucose trends daily.  Thank you. Lorenda Peck, RD, LDN, CDE Inpatient Diabetes Coordinator [phone]

## 2019-11-17 NOTE — Progress Notes (Addendum)
Dean Dixon   HEMATOLOGY/ONCOLOGY INPATIENT PROGRESS NOTE  Date of Service: 11/17/2019  Inpatient Attending: .Mariel Aloe, MD  SUBJECTIVE  O2 was discontinued earlier this morning.  His O2 sat was 99% on 1 L of O2.  O2 sats have not been rechecked since on room air.  Breathing continues to improve.  He is anxious to go home today.  He was started on prednisone 60 mg daily this morning.  PHYSICAL EXAMINATION: . Vitals:   11/16/19 2111 11/17/19 0536 11/17/19 0822 11/17/19 0900  BP: 113/66 124/69 125/70   Pulse: 73 (!) 58    Resp: 17 17    Temp: 97.8 F (36.6 C) (!) 97.5 F (36.4 C)    TempSrc: Oral Oral    SpO2: 97% 99%    Weight:    88.3 kg  Height:       Filed Weights   11/15/19 0500 11/16/19 0500 11/17/19 0900  Weight: 91.1 kg 90.8 kg 88.3 kg   .Body mass index is 33.41 kg/m.  GENERAL:alert SKIN: skin color, texture, turgor are normal, no rashes or significant lesions EYES: normal, conjunctiva are pink and non-injected, sclera clear OROPHARYNX:no exudate, no erythema and lips, buccal mucosa, and tongue normal  NECK: supple, no JVD, thyroid normal size, non-tender, without nodularity LYMPH:  no palpable lymphadenopathy in the cervical, axillary or inguinal LUNGS: Clear to auscultation bilaterally HEART: regular rate & rhythm,  no murmurs and no lower extremity edema ABDOMEN: abdomen soft, non-tender, normoactive bowel sounds  Musculoskeletal: no cyanosis of digits and no clubbing  PSYCH: alert & oriented x 3 with fluent speech NEURO: no focal motor/sensory deficits  MEDICAL HISTORY:  Past Medical History:  Diagnosis Date  . Cancer ([place])    Mantle Cell Lymphoma  . Diabetes mellitus without complication ([place])   . Hypertension     SURGICAL HISTORY: Past Surgical History:  Procedure Laterality Date  . APPENDECTOMY    . TONSILLECTOMY      SOCIAL HISTORY: Social History   Socioeconomic History  . Marital status: Divorced    Spouse name: Not on file  . Number  of children: Not on file  . Years of education: Not on file  . Highest education level: Not on file  Occupational History  . Not on file  Tobacco Use  . Smoking status: Never Smoker  . Smokeless tobacco: Never Used  Substance and Sexual Activity  . Alcohol use: Yes    Alcohol/week: 2.0 standard drinks    Types: 2 Standard drinks or equivalent per week  . Drug use: No  . Sexual activity: Not on file  Other Topics Concern  . Not on file  Social History Narrative  . Not on file   Social Determinants of Health   Financial Resource Strain:   . Difficulty of Paying Living Expenses:   Food Insecurity:   . Worried About Charity fundraiser in the Last Year:   . Arboriculturist in the Last Year:   Transportation Needs:   . Film/video editor (Medical):   Dean Dixon Lack of Transportation (Non-Medical):   Physical Activity:   . Days of Exercise per Week:   . Minutes of Exercise per Session:   Stress:   . Feeling of Stress :   Social Connections:   . Frequency of Communication with Friends and Family:   . Frequency of Social Gatherings with Friends and Family:   . Attends Religious Services:   . Active Member of Clubs or Organizations:   .  Attends Archivist Meetings:   Dean Dixon Marital Status:   Intimate Partner Violence:   . Fear of Current or Ex-Partner:   . Emotionally Abused:   Dean Dixon Physically Abused:   . Sexually Abused:     FAMILY HISTORY: History reviewed. No pertinent family history.  ALLERGIES:  is allergic to ivp dye [iodinated diagnostic agents].  MEDICATIONS:  Scheduled Meds: . amLODipine  10 mg Oral Daily  . azithromycin  500 mg Oral QHS  . enoxaparin (LOVENOX) injection  40 mg Subcutaneous Q24H  . feeding supplement (ENSURE ENLIVE)  237 mL Oral BID BM  . insulin aspart  0-5 Units Subcutaneous QHS  . insulin aspart  0-9 Units Subcutaneous TID WC  . insulin aspart  3 Units Subcutaneous TID WC  . insulin detemir  10 Units Subcutaneous QHS  . multivitamin  with minerals  1 tablet Oral Daily  . pantoprazole  40 mg Oral Q1200  . predniSONE  60 mg Oral Q breakfast  . ramipril  10 mg Oral Daily  . sodium chloride flush  3 mL Intravenous Q12H   Continuous Infusions: . sodium chloride    . ceFEPime (MAXIPIME) IV 2 g (11/17/19 0617)   PRN Meds:.sodium chloride, acetaminophen **OR** acetaminophen, heparin lock flush, heparin lock flush, HYDROcodone-acetaminophen, loperamide, LORazepam, ondansetron **OR** ondansetron (ZOFRAN) IV, senna-docusate, sodium chloride flush, sodium chloride flush, sodium chloride flush  REVIEW OF SYSTEMS:    10 Point review of Systems was done is negative except as noted above.   LABORATORY DATA:  I have reviewed the data as listed  . CBC Latest Ref Rng & Units 11/17/2019 11/16/2019 11/15/2019  WBC 4.0 - 10.5 K/uL 6.4 6.7 3.5(L)  Hemoglobin 13.0 - 17.0 g/dL 9.1(L) 9.2(L) 9.7(L)  Hematocrit 39.0 - 52.0 % 29.7(L) 29.5(L) 31.8(L)  Platelets 150 - 400 K/uL 127(L) 142(L) 137(L)    . CMP Latest Ref Rng & Units 11/17/2019 11/16/2019 11/15/2019  Glucose 70 - 99 mg/dL 206(H) 328(H) 315(H)  BUN 8 - 23 mg/dL 25(H) 22 15  Creatinine 0.61 - 1.24 mg/dL 0.76 0.78 0.73  Sodium 135 - 145 mmol/L 137 135 138  Potassium 3.5 - 5.1 mmol/L 3.9 4.4 4.5  Chloride 98 - 111 mmol/L 105 107 108  CO2 22 - 32 mmol/L 24 20(L) 20(L)  Calcium 8.9 - 10.3 mg/dL 8.8(L) 8.8(L) 8.5(L)  Total Protein 6.5 - 8.1 g/dL - 5.2(L) 5.6(L)  Total Bilirubin 0.3 - 1.2 mg/dL - 0.3 0.9  Alkaline Phos 38 - 126 U/L - 52 55  AST 15 - 41 U/L - 15 15  ALT 0 - 44 U/L - 14 14     RADIOGRAPHIC STUDIES: I have personally reviewed the radiological images as listed and agreed with the findings in the report. DG Chest 2 View  Result Date: 11/10/2019 CLINICAL DATA:  Mantle cell lymphoma.  Fever and shortness of breath EXAM: CHEST - 2 VIEW COMPARISON:  Oct 23, 2014 chest radiograph and chest CT FINDINGS: The lungs are clear. The heart size and pulmonary vascularity are  normal. No adenopathy. There is degenerative change in the thoracic spine. IMPRESSION: Lungs clear.  Cardiac silhouette within normal limits. Electronically Signed   By: Lowella Grip III M.D.   On: 11/10/2019 10:03   CT Angio Chest PE W and/or Wo Contrast  Result Date: 11/14/2019 CLINICAL DATA:  Shortness of breath. EXAM: CT ANGIOGRAPHY CHEST WITH CONTRAST TECHNIQUE: Multidetector CT imaging of the chest was performed using the standard protocol during bolus administration of intravenous contrast.  Multiplanar CT image reconstructions and MIPs were obtained to evaluate the vascular anatomy. CONTRAST:  154mL OMNIPAQUE IOHEXOL 350 MG/ML SOLN COMPARISON:  PET-CT dated September 19, 2018 FINDINGS: Cardiovascular: Evaluation is limited by respiratory motion artifact. Given this limitation, there was no acute pulmonary embolism detected.The heart size is normal. There is no significant pericardial effusion. There is no evidence for thoracic aortic dissection or aneurysm. Mediastinum/Nodes: --No mediastinal or hilar lymphadenopathy. --No axillary lymphadenopathy. --No supraclavicular lymphadenopathy. --Normal thyroid gland. --The esophagus is unremarkable Lungs/Pleura: There are hazy bilateral ground-glass airspace opacities. There is some interlobular septal thickening. There are trace bilateral pleural effusions. There is atelectasis at the lung bases. There is no pneumothorax. The trachea is unremarkable. Upper Abdomen: Large cyst is again noted in the patient's left hepatic lobe. There is persistent splenomegaly. There is a heterogeneous appearance of the splenic parenchyma which is felt to be secondary to contrast timing in the absence of left upper quadrant abdominal pain. Musculoskeletal: No chest wall abnormality. No acute or significant osseous findings. Review of the MIP images confirms the above findings. IMPRESSION: 1. Evaluation for pulmonary emboli is limited by respiratory motion artifact. Given this  limitation, no acute pulmonary embolism was detected. 2. There are diffuse bilateral hazy ground-glass airspace opacities which may represent an atypical infectious process or pulmonary edema. 3. Splenomegaly. Electronically Signed   By: Constance Holster M.D.   On: 11/14/2019 01:36   DG CHEST PORT 1 VIEW  Result Date: 11/17/2019 CLINICAL DATA:  Respiratory failure EXAM: PORTABLE CHEST 1 VIEW COMPARISON:  Radiograph 11/16/2019, CT 11/14/2019 FINDINGS: Persistently low lung volumes with areas of basilar atelectasis. More diffuse faint hazy opacities throughout both lungs could reflect multifocal infection or edema. No visible pneumothorax or effusion. Cardiomediastinal contours are stable. No acute osseous or soft tissue abnormality. Degenerative changes are present in the imaged spine and shoulders. Nasal cannula overlies the chest wall. IMPRESSION: Persistently low lung volumes with areas basilar atelectasis Faint hazy opacities throughout both lungs similar to comparison radiograph, could reflect multifocal infection or edema. Electronically Signed   By: Lovena Le M.D.   On: 11/17/2019 06:17   DG CHEST PORT 1 VIEW  Result Date: 11/16/2019 CLINICAL DATA:  Short of breath EXAM: PORTABLE CHEST 1 VIEW COMPARISON:  11/15/2019 FINDINGS: Improved aeration lungs with decreased bibasilar airspace disease likely atelectasis. No heart failure or effusion. No focal pneumonia. IMPRESSION: Improved aeration of the lungs with decrease in bibasilar atelectasis/infiltrate. Electronically Signed   By: Franchot Gallo M.D.   On: 11/16/2019 09:13   DG CHEST PORT 1 VIEW  Result Date: 11/15/2019 CLINICAL DATA:  69 year old male with shortness of breath. EXAM: PORTABLE CHEST 1 VIEW COMPARISON:  CTA chest 11/14/2019 and earlier. FINDINGS: Portable AP semi upright view at 0520 hours. Continued indistinct/interstitial basilar predominant bilateral pulmonary opacity, stable from the CTA yesterday, mildly increased at the  left base since 11/13/2019. Continued low lung volumes. Mediastinal contours remain normal. Visualized tracheal air column is within normal limits. No pneumothorax or pleural effusion. Paucity of bowel gas in the upper abdomen. No acute osseous abnormality identified. IMPRESSION: Continued low lung volumes with basilar predominant indistinct/interstitial opacity which could reflect a combination of atelectasis and infection versus edema. No pleural effusion. Electronically Signed   By: Genevie Ann M.D.   On: 11/15/2019 08:30   DG Chest Port 1 View  Result Date: 11/13/2019 CLINICAL DATA:  Initial evaluation for acute shortness of breath, weakness. EXAM: PORTABLE CHEST 1 VIEW COMPARISON:  Prior radiograph from 11/10/2019.  FINDINGS: Exaggeration of the cardiac silhouette related AP technique and low lung volumes. Mediastinal silhouette normal. Lungs hypoinflated. Patchy and hazy opacity seen within the mid and lower lungs bilaterally, which could reflect atelectasis or possibly infiltrates. Underlying mild pulmonary interstitial congestion. No appreciable pleural effusion. No pneumothorax. No acute osseous finding. IMPRESSION: 1. Patchy and hazy opacity within the mid and lower lungs bilaterally, which could reflect atelectasis or possibly infiltrates. 2. Underlying mild pulmonary interstitial congestion. Electronically Signed   By: Jeannine Boga M.D.   On: 11/13/2019 21:36   ECHOCARDIOGRAM COMPLETE  Result Date: 11/14/2019    ECHOCARDIOGRAM REPORT   Patient Name:   Dean Dixon Date of Exam: 11/14/2019 Medical Rec #:  [id number]    Height:       64.0 in Accession #:    [id number]   Weight:       195.0 lb Date of Birth:  [birth year]   BSA:          1.935 m Patient Age:    41 years     BP:           158/72 mmHg Patient Gender: M            HR:           90 bpm. Exam Location:  Inpatient Procedure: 2D Echo, Cardiac Doppler and Color Doppler Indications:    Dyspnea 786.09 / R06.00  History:        Patient has no  prior history of Echocardiogram examinations.                 Risk Factors:Hypertension, Diabetes and Non-Smoker.  Sonographer:    Vickie Epley RDCS Referring Phys: [id number] [place]  1. Left ventricular ejection fraction, by estimation, is 50 to 55%. The left ventricle has low normal function. The left ventricle has no regional wall motion abnormalities. Left ventricular diastolic parameters are consistent with Grade I diastolic dysfunction (impaired relaxation).  2. Right ventricular systolic function is normal. The right ventricular size is normal.  3. The mitral valve is normal in structure. No evidence of mitral valve regurgitation. No evidence of mitral stenosis.  4. The aortic valve is normal in structure. Aortic valve regurgitation is not visualized. No aortic stenosis is present. FINDINGS  Left Ventricle: Left ventricular ejection fraction, by estimation, is 50 to 55%. The left ventricle has low normal function. The left ventricle has no regional wall motion abnormalities. The left ventricular internal cavity size was normal in size. There is no left ventricular hypertrophy. Left ventricular diastolic parameters are consistent with Grade I diastolic dysfunction (impaired relaxation). Right Ventricle: The right ventricular size is normal. No increase in right ventricular wall thickness. Right ventricular systolic function is normal. Left Atrium: Left atrial size was normal in size. Right Atrium: Right atrial size was normal in size. Pericardium: There is no evidence of pericardial effusion. Mitral Valve: The mitral valve is normal in structure. No evidence of mitral valve regurgitation. No evidence of mitral valve stenosis. Tricuspid Valve: The tricuspid valve is normal in structure. Tricuspid valve regurgitation is trivial. Aortic Valve: The aortic valve is normal in structure. Aortic valve regurgitation is not visualized. No aortic stenosis is present. Pulmonic Valve: The pulmonic valve was  grossly normal. Pulmonic valve regurgitation is not visualized. Aorta: The aortic root and ascending aorta are structurally normal, with no evidence of dilitation. IAS/Shunts: The atrial septum is grossly normal.  LEFT VENTRICLE PLAX 2D LVIDd:  4.60 cm      Diastology LVIDs:         3.00 cm      LV e' lateral:   9.90 cm/s LV PW:         0.90 cm      LV E/e' lateral: 7.2 LV IVS:        0.90 cm      LV e' medial:    6.42 cm/s LVOT diam:     1.90 cm      LV E/e' medial:  11.1 LV SV:         58 LV SV Index:   30 LVOT Area:     2.84 cm  LV Volumes (MOD) LV vol d, MOD A2C: 101.0 ml LV vol d, MOD A4C: 88.6 ml LV vol s, MOD A2C: 41.0 ml LV vol s, MOD A4C: 41.8 ml LV SV MOD A2C:     60.0 ml LV SV MOD A4C:     88.6 ml LV SV MOD BP:      52.8 ml RIGHT VENTRICLE RV S prime:     17.70 cm/s TAPSE (M-mode): 2.7 cm LEFT ATRIUM             Index       RIGHT ATRIUM          Index LA diam:        3.40 cm 1.76 cm/m  RA Area:     9.02 cm LA Vol (A2C):   27.9 ml 14.42 ml/m RA Volume:   14.80 ml 7.65 ml/m LA Vol (A4C):   35.0 ml 18.08 ml/m LA Biplane Vol: 33.8 ml 17.46 ml/m  AORTIC VALVE LVOT Vmax:   118.00 cm/s LVOT Vmean:  76.800 cm/s LVOT VTI:    0.204 m  AORTA Ao Root diam: 3.30 cm MITRAL VALVE MV Area (PHT): 3.50 cm    SHUNTS MV Decel Time: 217 msec    Systemic VTI:  0.20 m MV E velocity: 71.10 cm/s  Systemic Diam: 1.90 cm MV A velocity: 78.80 cm/s MV E/A ratio:  0.90 Mertie Moores MD Electronically signed by Mertie Moores MD Signature Date/Time: 11/14/2019/4:33:03 PM    Final     ASSESSMENT & PLAN:   69 yo with   1) Mantle cell lymphoma S/p 4 cycles of BR + 1 additional cycle of Rituxan  2) Symptomatic Anemia -- ? Likely related to chemotherapy  3) Acute hypoxic respiratory failure. Flu like illness with fevers. Fevers now resolved with b/l interstitial infiltrated and b/l ground glass opacities SARS-COV2 neg  Concern for ? Atypical pneumonia vs Rituxan related ILD or hypersensitivity pneumonitis.  4)  Dm2 -- expect hyperglycemia in acute illness and from steroids  PLAN -transfuse PRBC prn for hgb <8 in the setting of acute respiratory failure and limited physiologic reserves.  Status post 1 unit PRBCs on 11/14/2019 with improvement of his hemoglobin.  No transfusion indicated today. -PJP PCR on expectorated sputum-ordered but still needs to be collected (not really coughing suspect we will not be able to collect this) -time course and presentation concern for Rituxan-ILD or Hypersensitivity pneumonitis and absence of overt infectious findings at this time --he has been started on IV steroids and now transitioned to prednisone 60 mg daily.  Recommend prednisone 60 mg daily for 5 days and then decrease to 40 mg daily.  We will continue to wean as an outpatient. -Now on room air and continue to monitor O2 sats closely. -will need management of hyperglycemia per  hospitalist -Appreciate input from pulmonology. -no plan for addition chemo-immunotherapy at this time. -He was encouraged to ambulate in the hallway today. -He will need a PET/CT in about 5 to 6 weeks. -Continue incentive spirometer -Okay to discharge from our standpoint.  I will arrange for an outpatient follow-up next week to continue to wean his prednisone.  Recommend prednisone 60 mg daily x5 days and then reduce to 40 mg daily.  Mikey Bussing, DNP, AGPCNP-BC, AOCNP Mon/Tues/Thurs/Fri 7am-5pm; Off Wednesdays Cell: [phone]   ADDENDUM  .Patient was Personally and independently interviewed, examined and relevant elements of the history of present illness were reviewed in details and an assessment and plan was created. All elements of the patient's history of present illness , assessment and plan were discussed in details with Mikey Bussing, DNP. The above documentation reflects our combined findings assessment and plan.  Sullivan Lone MD MS

## 2019-11-19 LAB — CULTURE, BLOOD (ROUTINE X 2)
Culture: NO GROWTH
Culture: NO GROWTH
Special Requests: ADEQUATE

## 2019-11-22 ENCOUNTER — Telehealth: Payer: Self-pay | Admitting: Hematology

## 2019-11-22 LAB — HYPERSENSITIVITY PNEUMONITIS
A. Pullulans Abs: NEGATIVE
A.Fumigatus #1 Abs: NEGATIVE
Micropolyspora faeni, IgG: NEGATIVE
Pigeon Serum Abs: NEGATIVE
Thermoact. Saccharii: NEGATIVE
Thermoactinomyces vulgaris, IgG: NEGATIVE

## 2019-11-22 NOTE — Telephone Encounter (Signed)
Called pt to confirm appt per 5/27 sch message - left message with appt date and time

## 2019-11-23 DIAGNOSIS — E1165 Type 2 diabetes mellitus with hyperglycemia: Secondary | ICD-10-CM | POA: Diagnosis not present

## 2019-11-23 DIAGNOSIS — C8318 Mantle cell lymphoma, lymph nodes of multiple sites: Secondary | ICD-10-CM | POA: Diagnosis not present

## 2019-11-23 DIAGNOSIS — D649 Anemia, unspecified: Secondary | ICD-10-CM | POA: Diagnosis not present

## 2019-11-23 DIAGNOSIS — J849 Interstitial pulmonary disease, unspecified: Secondary | ICD-10-CM | POA: Diagnosis not present

## 2019-11-23 DIAGNOSIS — Z79899 Other long term (current) drug therapy: Secondary | ICD-10-CM | POA: Diagnosis not present

## 2019-11-23 DIAGNOSIS — T451X5A Adverse effect of antineoplastic and immunosuppressive drugs, initial encounter: Secondary | ICD-10-CM | POA: Diagnosis not present

## 2019-11-24 ENCOUNTER — Other Ambulatory Visit: Payer: Self-pay

## 2019-11-24 NOTE — Progress Notes (Signed)
HEMATOLOGY/ONCOLOGY CLINIC NOTE  Date of Service: 11/25/2019  Patient Care Team: Leonides Sake, MD as PCP - General (Family Medicine) Thana Ates, RN as [place] Management  CHIEF COMPLAINTS/PURPOSE OF CONSULTATION:  F/u for recently diagnosed mantle cell lymphoma  HISTORY OF PRESENTING ILLNESS:   Dean Dixon is a wonderful 69 y.o. male who has been referred to [place] by Dr Daiva Eves for evaluation and management of Lymphocytosis. The pt reports that he is doing well overall.   The pt reports that he has had a high lymphocyte count for several years but it has never been as high as now. 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 This most recent lymphocytosis was picked up by him looking at his own blood in the lab. He has not felt any differently in the last 6 months to 1 year. Pt has arthritis, tendonitis and bursitis in his shoulder. He has not had any concerns of RA and no significantly swollen joints. Pt tore an achilles tendon and then flew on a plane which lead to a blood clot. He has been on a testosterone supplement before but has not used it in a while. Pt has had diverticulitis. He has been losing weight slowly by eating better but has not noticed any unexpected weight loss. Pt does not smoke, does not share needles and has not needed any blood transfusions in the past. Pt denies any respiratory illness or infection symptoms in the last few months. He used to have an elevated IgM level about 30 years ago. He does have several fatty lipomas, a few which he has had removed, but there have been no concerns associated with them. He has recently stopped taking herbal supplements until he can figure out what is going on with his blood counts. He has been taking Glucosamine, Chondroitin & MSM as well as a 25 mg CBD gummy and 10 mg of CBD SL for his joint pain for a few years. He is currently taking Glipizide and Metformin. The Metformin did give the pt diarrhea, which  has since resolved.   Most recent lab results (04/12/19) of CBC w/diff and CMP is as follows: all values are WNL except for Glucose at 191, WBC at 30.5K, Lymphs Abs at 19.6K, Mono Abs at 3.6K, Eos Abs at 0.5K, Baso Abs at 0.3K, Abs Immature Granulocytes at 0.3K. 04/12/2019 PSA at 2.2  On review of systems, pt reports joint pain and denies rashes, fevers, chills, night sweats, unexpected weight loss, issues with swallowing, abdominal pain, bowel movement issues, leg pain, respiratory symptoms, diarrhea and any other symptoms.   On PMHx the pt reports HTN, Diabetes, Arthritis, Diverticulitis. On Social Hx the pt reports that he is a non-smoker.  INTERVAL HISTORY:   Dean Dixon is a wonderful 69 y.o. male who is here for evaluation and management of Lymphocytosis. He is here for C6D1 of Bendamustine + Rituxan. The patient's last visit with [place] was on 11/10/19. The pt reports that he is doing well overall.  The pt reports he is good. The pt has been getting better with each day. He is not feeling SOB anymore. Pt feels that he is 80% of his baseline and the other 20% would be he cannot always take as deep of a breath as he wants. He got an oximeter machine and his SpO2 levels have been over 93% even with walking. Pt is off of Prednisone yesterday. His blood sugars have been high with the  prednisone. He is getting 20-30 minutes of walking daily.   Lab results today (11/25/19) of CBC w/diff and CMP is as follows: all values are WNL except for WBC at 3.5K, RBC at 3.57, Hemoglobin at 10.4, HCT at 31.8, RDW at 15.7, Platelet at 102K, Lymph's Abs at 0.2k, Abs Immature Granulocytes at 0.18K, Glucose at 245, Calcium at 8.7, Total Protein at 5.5, Albumin at 3.2  On review of systems, pt reports healthy appetite and denies fevers, chills, abdominal pain and any other symptoms.   MEDICAL HISTORY:  Past Medical History:  Diagnosis Date  . Cancer ([place])    Mantle Cell Lymphoma  . Diabetes mellitus without  complication ([place])   . Hypertension     SURGICAL HISTORY: Past Surgical History:  Procedure Laterality Date  . APPENDECTOMY    . TONSILLECTOMY      SOCIAL HISTORY: Social History   Socioeconomic History  . Marital status: Divorced    Spouse name: Not on file  . Number of children: Not on file  . Years of education: Not on file  . Highest education level: Not on file  Occupational History  . Not on file  Tobacco Use  . Smoking status: Never Smoker  . Smokeless tobacco: Never Used  Substance and Sexual Activity  . Alcohol use: Yes    Alcohol/week: 2.0 standard drinks    Types: 2 Standard drinks or equivalent per week  . Drug use: No  . Sexual activity: Not on file  Other Topics Concern  . Not on file  Social History Narrative  . Not on file   Social Determinants of Health   Financial Resource Strain:   . Difficulty of Paying Living Expenses:   Food Insecurity:   . Worried About Charity fundraiser in the Last Year:   . Arboriculturist in the Last Year:   Transportation Needs:   . Film/video editor (Medical):   Marland Kitchen Lack of Transportation (Non-Medical):   Physical Activity:   . Days of Exercise per Week:   . Minutes of Exercise per Session:   Stress:   . Feeling of Stress :   Social Connections:   . Frequency of Communication with Friends and Family:   . Frequency of Social Gatherings with Friends and Family:   . Attends Religious Services:   . Active Member of Clubs or Organizations:   . Attends Archivist Meetings:   Marland Kitchen Marital Status:   Intimate Partner Violence:   . Fear of Current or Ex-Partner:   . Emotionally Abused:   Marland Kitchen Physically Abused:   . Sexually Abused:     FAMILY HISTORY: No family history on file.  ALLERGIES:  is allergic to ivp dye [iodinated diagnostic agents].  MEDICATIONS:  Current Outpatient Medications  Medication Sig Dispense Refill  . allopurinol (ZYLOPRIM) 100 MG tablet Take 1 tablet (100 mg total) by mouth 2  (two) times daily. 60 tablet 0  . amLODipine (NORVASC) 10 MG tablet Take 10 mg by mouth daily.    . Ascorbic Acid (VITAMIN C) 500 MG CAPS Take 1 capsule by mouth daily.    . blood glucose meter kit and supplies Check blood sugar 4 times daily (Before breakfast, lunch, dinner, bedtime). (FOR ICD-10 E10.9, E11.9). 1 each 0  . CANNABIDIOL PO Take 30 mg by mouth daily.    . Coenzyme Q10 (COQ-10 PO) Take 1 capsule by mouth daily. Takes CoQ 10 w/cinnamon    . Glucosamine-Chondroitin (GLUCOSAMINE CHONDR COMPLEX PO)  Take 1 tablet by mouth daily.    . insulin detemir (LEVEMIR) 100 UNIT/ML FlexPen Inject 10 Units into the skin at bedtime for 5 days. 3 mL 0  . Insulin Pen Needle 31G X 5 MM MISC BD Pen Needles- brand specific Inject insulin via insulin pen 6 x daily 30 each 0  . LORazepam (ATIVAN) 0.5 MG tablet Take 1 tablet (0.5 mg total) by mouth every 6 (six) hours as needed (Nausea or vomiting). 30 tablet 0  . metFORMIN (GLUCOPHAGE-XR) 500 MG 24 hr tablet Take 500 mg by mouth in the morning and at bedtime.     . Multiple Vitamin (MULTIVITAMIN) tablet Take 1 tablet by mouth daily.    . ondansetron (ZOFRAN) 8 MG tablet Take 1 tablet (8 mg total) by mouth 2 (two) times daily as needed for refractory nausea / vomiting. Start on day 2 after bendamustine chemotherapy. 30 tablet 1  . pantoprazole (PROTONIX) 40 MG tablet Take 1 tablet (40 mg total) by mouth daily at 12 noon for 4 days. 4 tablet 0  . prochlorperazine (COMPAZINE) 10 MG tablet Take 1 tablet (10 mg total) by mouth every 6 (six) hours as needed (Nausea or vomiting). 30 tablet 1  . ramipril (ALTACE) 10 MG capsule Take 10 mg by mouth daily.     No current facility-administered medications for this visit.    REVIEW OF SYSTEMS:   A 10+ POINT REVIEW OF SYSTEMS WAS OBTAINED including neurology, dermatology, psychiatry, cardiac, respiratory, lymph, extremities, GI, GU, Musculoskeletal, constitutional, breasts, reproductive, HEENT.  All pertinent positives  are noted in the HPI.  All others are negative.   PHYSICAL EXAMINATION: ECOG PERFORMANCE STATUS: 0 - Asymptomatic  . Vitals:   11/25/19 1111  BP: (!) 180/81  Pulse: 92  Resp: 18  Temp: 97.7 F (36.5 C)  SpO2: 93%   Filed Weights   11/25/19 1111  Weight: 195 lb 1.6 oz (88.5 kg)   .Body mass index is 33.49 kg/m.   GENERAL:alert, in no acute distress and comfortable SKIN: no acute rashes, no significant lesions EYES: conjunctiva are pink and non-injected, sclera anicteric OROPHARYNX: MMM, no exudates, no oropharyngeal erythema or ulceration NECK: supple, no JVD LYMPH:  no palpable lymphadenopathy in the cervical, axillary or inguinal regions LUNGS: clear to auscultation b/l with normal respiratory effort HEART: regular rate & rhythm ABDOMEN:  normoactive bowel sounds , non tender, not distended. Extremity: no pedal edema PSYCH: alert & oriented x 3 with fluent speech NEURO: no focal motor/sensory deficits  LABORATORY DATA:  I have reviewed the data as listed  . CBC Latest Ref Rng & Units 11/25/2019 11/17/2019 11/16/2019  WBC 4.0 - 10.5 K/uL 3.5(L) 6.4 6.7  Hemoglobin 13.0 - 17.0 g/dL 10.4(L) 9.1(L) 9.2(L)  Hematocrit 39.0 - 52.0 % 31.8(L) 29.7(L) 29.5(L)  Platelets 150 - 400 K/uL 102(L) 127(L) 142(L)    . CMP Latest Ref Rng & Units 11/25/2019 11/17/2019 11/16/2019  Glucose 70 - 99 mg/dL 245(H) 206(H) 328(H)  BUN 8 - 23 mg/dL 13 25(H) 22  Creatinine 0.61 - 1.24 mg/dL 0.83 0.76 0.78  Sodium 135 - 145 mmol/L 143 137 135  Potassium 3.5 - 5.1 mmol/L 3.7 3.9 4.4  Chloride 98 - 111 mmol/L 105 105 107  CO2 22 - 32 mmol/L 27 24 20(L)  Calcium 8.9 - 10.3 mg/dL 8.7(L) 8.8(L) 8.8(L)  Total Protein 6.5 - 8.1 g/dL 5.5(L) - 5.2(L)  Total Bilirubin 0.3 - 1.2 mg/dL 0.7 - 0.3  Alkaline Phos 38 - 126 U/L 79 -  52  AST 15 - 41 U/L 21 - 15  ALT 0 - 44 U/L 32 - 14   06/10/2019 Flow Pathology Report ([id number]):   06/10/2019 Surgical Pathology ([id number]):   Surgical  Pathology  CASE: [id number]  PATIENT: Geremiah Wedeking  Flow Pathology Report      Clinical history: Lymphocytosis      DIAGNOSIS:   -Abnormal B-cell population identified.  -See comment   COMMENT:   Analysis of the lymphoid population shows a major abnormal B-cell  population representing 74% of all lymphocytes with expression of  HLA-Dr, CD20 and CD5. This is associated with extremely dim/negative  staining for surface immunoglobulin light chains hindering assessment  and or quantitation of clonality. No CD10, CD38, or CD34 expression is  identified. The findings are most consistent with a B-cell  lymphoproliferative process. Examination of peripheral blood shows  predominance of medium and large atypical lymphoid cells characterized  by coarse chromatin, moderately abundant agranular blue cytoplasm and  occasional cells with small nucleoli. Consideration should be given to  chronic lymphocytic leukemia variant, mantle cell lymphoma, as well as  other CD5 positive B-cell lymphoproliferative processes. FISH studies  may be helpful in this regard.   GATING AND PHENOTYPIC ANALYSIS:   Gated population: Flow cytometric immunophenotyping is performed using  antibodies to the antigens listed in the table below. Electronic gates  are placed around a cell cluster displaying light scatter properties  corresponding to: lymphocytes   Abnormal Cells in gated population: 74%   Phenotype of Abnormal Cells: CD5, CD20, HLA-Dr    04/26/2019 Flow Cytometry [id number]):    05/02/2019 FISH:    WBC Neutros Lymphs  02/05/2017 9.9K 6.0K 2.6K  10/06/2017 12.6K 7.7K 3.2K  11/02/2018 12.3K 5.2K 5.6K    RADIOGRAPHIC STUDIES: I have personally reviewed the radiological images as listed and agreed with the findings in the report. DG Chest 2 View  Result Date: 11/10/2019 CLINICAL DATA:  Mantle cell lymphoma.  Fever and shortness of breath EXAM: CHEST - 2 VIEW COMPARISON:  Oct 23, 2014 chest radiograph and chest CT FINDINGS: The lungs are clear. The heart size and pulmonary vascularity are normal. No adenopathy. There is degenerative change in the thoracic spine. IMPRESSION: Lungs clear.  Cardiac silhouette within normal limits. Electronically Signed   By: Lowella Grip III M.D.   On: 11/10/2019 10:03   CT Angio Chest PE W and/or Wo Contrast  Result Date: 11/14/2019 CLINICAL DATA:  Shortness of breath. EXAM: CT ANGIOGRAPHY CHEST WITH CONTRAST TECHNIQUE: Multidetector CT imaging of the chest was performed using the standard protocol during bolus administration of intravenous contrast. Multiplanar CT image reconstructions and MIPs were obtained to evaluate the vascular anatomy. CONTRAST:  11m OMNIPAQUE IOHEXOL 350 MG/ML SOLN COMPARISON:  PET-CT dated September 19, 2018 FINDINGS: Cardiovascular: Evaluation is limited by respiratory motion artifact. Given this limitation, there was no acute pulmonary embolism detected.The heart size is normal. There is no significant pericardial effusion. There is no evidence for thoracic aortic dissection or aneurysm. Mediastinum/Nodes: --No mediastinal or hilar lymphadenopathy. --No axillary lymphadenopathy. --No supraclavicular lymphadenopathy. --Normal thyroid gland. --The esophagus is unremarkable Lungs/Pleura: There are hazy bilateral ground-glass airspace opacities. There is some interlobular septal thickening. There are trace bilateral pleural effusions. There is atelectasis at the lung bases. There is no pneumothorax. The trachea is unremarkable. Upper Abdomen: Large cyst is again noted in the patient's left hepatic lobe. There is persistent splenomegaly. There is a heterogeneous appearance of the splenic parenchyma which is felt to be  secondary to contrast timing in the absence of left upper quadrant abdominal pain. Musculoskeletal: No chest wall abnormality. No acute or significant osseous findings. Review of the MIP images confirms the above  findings. IMPRESSION: 1. Evaluation for pulmonary emboli is limited by respiratory motion artifact. Given this limitation, no acute pulmonary embolism was detected. 2. There are diffuse bilateral hazy ground-glass airspace opacities which may represent an atypical infectious process or pulmonary edema. 3. Splenomegaly. Electronically Signed   By: Constance Holster M.D.   On: 11/14/2019 01:36   DG CHEST PORT 1 VIEW  Result Date: 11/17/2019 CLINICAL DATA:  Respiratory failure EXAM: PORTABLE CHEST 1 VIEW COMPARISON:  Radiograph 11/16/2019, CT 11/14/2019 FINDINGS: Persistently low lung volumes with areas of basilar atelectasis. More diffuse faint hazy opacities throughout both lungs could reflect multifocal infection or edema. No visible pneumothorax or effusion. Cardiomediastinal contours are stable. No acute osseous or soft tissue abnormality. Degenerative changes are present in the imaged spine and shoulders. Nasal cannula overlies the chest wall. IMPRESSION: Persistently low lung volumes with areas basilar atelectasis Faint hazy opacities throughout both lungs similar to comparison radiograph, could reflect multifocal infection or edema. Electronically Signed   By: Lovena Le M.D.   On: 11/17/2019 06:17   DG CHEST PORT 1 VIEW  Result Date: 11/16/2019 CLINICAL DATA:  Short of breath EXAM: PORTABLE CHEST 1 VIEW COMPARISON:  11/15/2019 FINDINGS: Improved aeration lungs with decreased bibasilar airspace disease likely atelectasis. No heart failure or effusion. No focal pneumonia. IMPRESSION: Improved aeration of the lungs with decrease in bibasilar atelectasis/infiltrate. Electronically Signed   By: Franchot Gallo M.D.   On: 11/16/2019 09:13   DG CHEST PORT 1 VIEW  Result Date: 11/15/2019 CLINICAL DATA:  69 year old male with shortness of breath. EXAM: PORTABLE CHEST 1 VIEW COMPARISON:  CTA chest 11/14/2019 and earlier. FINDINGS: Portable AP semi upright view at 0520 hours. Continued  indistinct/interstitial basilar predominant bilateral pulmonary opacity, stable from the CTA yesterday, mildly increased at the left base since 11/13/2019. Continued low lung volumes. Mediastinal contours remain normal. Visualized tracheal air column is within normal limits. No pneumothorax or pleural effusion. Paucity of bowel gas in the upper abdomen. No acute osseous abnormality identified. IMPRESSION: Continued low lung volumes with basilar predominant indistinct/interstitial opacity which could reflect a combination of atelectasis and infection versus edema. No pleural effusion. Electronically Signed   By: Genevie Ann M.D.   On: 11/15/2019 08:30   DG Chest Port 1 View  Result Date: 11/13/2019 CLINICAL DATA:  Initial evaluation for acute shortness of breath, weakness. EXAM: PORTABLE CHEST 1 VIEW COMPARISON:  Prior radiograph from 11/10/2019. FINDINGS: Exaggeration of the cardiac silhouette related AP technique and low lung volumes. Mediastinal silhouette normal. Lungs hypoinflated. Patchy and hazy opacity seen within the mid and lower lungs bilaterally, which could reflect atelectasis or possibly infiltrates. Underlying mild pulmonary interstitial congestion. No appreciable pleural effusion. No pneumothorax. No acute osseous finding. IMPRESSION: 1. Patchy and hazy opacity within the mid and lower lungs bilaterally, which could reflect atelectasis or possibly infiltrates. 2. Underlying mild pulmonary interstitial congestion. Electronically Signed   By: Jeannine Boga M.D.   On: 11/13/2019 21:36   ECHOCARDIOGRAM COMPLETE  Result Date: 11/14/2019    ECHOCARDIOGRAM REPORT   Patient Name:   JOSEF TOURIGNY Date of Exam: 11/14/2019 Medical Rec #:  [id number]    Height:       64.0 in Accession #:    [id number]   Weight:       195.0 lb Date  of Birth:  [birth year]   BSA:          1.935 m Patient Age:    69 years     BP:           158/72 mmHg Patient Gender: M            HR:           90 bpm. Exam Location:   Inpatient Procedure: 2D Echo, Cardiac Doppler and Color Doppler Indications:    Dyspnea 786.09 / R06.00  History:        Patient has no prior history of Echocardiogram examinations.                 Risk Factors:Hypertension, Diabetes and Non-Smoker.  Sonographer:    Vickie Epley RDCS Referring Phys: [id number] [place]  1. Left ventricular ejection fraction, by estimation, is 50 to 55%. The left ventricle has low normal function. The left ventricle has no regional wall motion abnormalities. Left ventricular diastolic parameters are consistent with Grade I diastolic dysfunction (impaired relaxation).  2. Right ventricular systolic function is normal. The right ventricular size is normal.  3. The mitral valve is normal in structure. No evidence of mitral valve regurgitation. No evidence of mitral stenosis.  4. The aortic valve is normal in structure. Aortic valve regurgitation is not visualized. No aortic stenosis is present. FINDINGS  Left Ventricle: Left ventricular ejection fraction, by estimation, is 50 to 55%. The left ventricle has low normal function. The left ventricle has no regional wall motion abnormalities. The left ventricular internal cavity size was normal in size. There is no left ventricular hypertrophy. Left ventricular diastolic parameters are consistent with Grade I diastolic dysfunction (impaired relaxation). Right Ventricle: The right ventricular size is normal. No increase in right ventricular wall thickness. Right ventricular systolic function is normal. Left Atrium: Left atrial size was normal in size. Right Atrium: Right atrial size was normal in size. Pericardium: There is no evidence of pericardial effusion. Mitral Valve: The mitral valve is normal in structure. No evidence of mitral valve regurgitation. No evidence of mitral valve stenosis. Tricuspid Valve: The tricuspid valve is normal in structure. Tricuspid valve regurgitation is trivial. Aortic Valve: The aortic valve is  normal in structure. Aortic valve regurgitation is not visualized. No aortic stenosis is present. Pulmonic Valve: The pulmonic valve was grossly normal. Pulmonic valve regurgitation is not visualized. Aorta: The aortic root and ascending aorta are structurally normal, with no evidence of dilitation. IAS/Shunts: The atrial septum is grossly normal.  LEFT VENTRICLE PLAX 2D LVIDd:         4.60 cm      Diastology LVIDs:         3.00 cm      LV e' lateral:   9.90 cm/s LV PW:         0.90 cm      LV E/e' lateral: 7.2 LV IVS:        0.90 cm      LV e' medial:    6.42 cm/s LVOT diam:     1.90 cm      LV E/e' medial:  11.1 LV SV:         58 LV SV Index:   30 LVOT Area:     2.84 cm  LV Volumes (MOD) LV vol d, MOD A2C: 101.0 ml LV vol d, MOD A4C: 88.6 ml LV vol s, MOD A2C: 41.0 ml LV vol s, MOD A4C: 41.8  ml LV SV MOD A2C:     60.0 ml LV SV MOD A4C:     88.6 ml LV SV MOD BP:      52.8 ml RIGHT VENTRICLE RV S prime:     17.70 cm/s TAPSE (M-mode): 2.7 cm LEFT ATRIUM             Index       RIGHT ATRIUM          Index LA diam:        3.40 cm 1.76 cm/m  RA Area:     9.02 cm LA Vol (A2C):   27.9 ml 14.42 ml/m RA Volume:   14.80 ml 7.65 ml/m LA Vol (A4C):   35.0 ml 18.08 ml/m LA Biplane Vol: 33.8 ml 17.46 ml/m  AORTIC VALVE LVOT Vmax:   118.00 cm/s LVOT Vmean:  76.800 cm/s LVOT VTI:    0.204 m  AORTA Ao Root diam: 3.30 cm MITRAL VALVE MV Area (PHT): 3.50 cm    SHUNTS MV Decel Time: 217 msec    Systemic VTI:  0.20 m MV E velocity: 71.10 cm/s  Systemic Diam: 1.90 cm MV A velocity: 78.80 cm/s MV E/A ratio:  0.90 Mertie Moores MD Electronically signed by Mertie Moores MD Signature Date/Time: 11/14/2019/4:33:03 PM    Final     ASSESSMENT & PLAN:   69 yo male with   1) Progressively increasing Lymphocytosis concerning for a lymphoproliferative. CD5+ve lymphoproliferative disorder likely CLL less likely Mantle cell lymphoma CLL FISH shows 17p mutation -04/26/2019 Flow Cytometry ([id number]) revealed "Abnormal B-cell  population identified. CD5+ clonal lymphoproliferative disorder" -04/26/2019 FISH revealed "Positive for p53 (17p13) deletion." -05/31/2019 PET/CT ([id number]) revealed "1. There are multiple prominent lymph nodes in the neck, both axilla, the retroperitoneum and pelvis with low level hypermetabolic activity consistent with chronic lymphocytic leukemia. [place] 3 and 4. 2. Mild splenomegaly and mild generalized splenic hypermetabolic activity 3. No evidence of bone marrow involvement." -06/10/2019 Flow Pathology Report ([id number]) revealed "Abnormal B-cell population." -06/10/2019 Surgical Pathology Report ([id number])  revealed "Atypical lymphoid proliferation consistent with mantle cell lymphoma." -09/19/19 PET Skull Base to Thigh ([id number])-- favorable response to current treatment  2) Rigors from Rituxan - needing demerol as premedication. Rituxan rate to be capped at '150mg'$ /hour  3) Thrombocytopenia from chemotherapy  PLAN: -Discussed pt labwork today, 11/25/19; of CBC w/diff and CMP is as follows: all values are WNL except for WBC at 3.5K, RBC at 3.57, Hemoglobin at 10.4, HCT at 31.8, RDW at 15.7, Platelet at 102K, Lymph's Abs at 0.2k, Abs Immature Granulocytes at 0.18K, Glucose at 245, Calcium at 8.7, Total Protein at 5.5, Albumin at 3.2 -Advised tapering of prednisone  -Advised on being on prednisone for longer periods of time -taking preventive dose of bactrim 1 tablet 3 times a week -Advised taking prednisone with food -Advised continue to watching insulin with steroids  -Will repeat PET scan 6-8 weeks from now -Recommended that the pt continue to eat well, drink at least 48-64 oz of water each day, and walk 20-30 minutes each day.  -Recommends f/u with pulmonologist as scheduled  -Recommends taking B-complex supplement once a day -Recommends tapering prednisone over 3-4 weeks  -Will see back in 3 weeks -might need to avoid Rituxan and rethink consideration of Rituxan.for  maintenance therapy.  FOLLOW UP: RTC with Dr Irene Limbo with labs in 3 weeks  The total time spent in the appt was 20 minutes and more than 50% was on counseling and direct patient cares.  All  of the patient's questions were answered with apparent satisfaction. The patient knows to call the clinic with any problems, questions or concerns.  Sullivan Lone MD MS AAHIVMS [facility] Hematology/Oncology Physician [facility]  (Office):       [phone] (Work cell):  [phone] (Fax):           [phone]  11/25/2019 12:00 PM  I, Dawayne Cirri am acting as a Education administrator for Dr. Sullivan Lone.   .I have reviewed the above documentation for accuracy and completeness, and I agree with the above. Brunetta Genera MD

## 2019-11-24 NOTE — Patient Outreach (Signed)
[facility]) Care Management  11/24/2019  Dean Dixon 25-Jul-1950 [id number]   Red emmi: Date of call to patient: 11/23/2019 Reason for red emmi:  Got discharge papers----no                                       Knows who to call about changes in condition---no  Placed call to home and cell phone numbers. Left a message at both numbers.  PLAN: will mail outreach letter and call back in 3 business days.  Tomasa Rand, RN, BSN, CEN [facility] [employer] [phone]

## 2019-11-25 ENCOUNTER — Other Ambulatory Visit: Payer: Self-pay

## 2019-11-25 ENCOUNTER — Inpatient Hospital Stay: Payer: Medicare Other

## 2019-11-25 ENCOUNTER — Inpatient Hospital Stay: Payer: Medicare Other | Attending: Hematology | Admitting: Hematology

## 2019-11-25 VITALS — BP 180/81 | HR 92 | Temp 97.7°F | Resp 18 | Ht 64.0 in | Wt 195.1 lb

## 2019-11-25 DIAGNOSIS — Z794 Long term (current) use of insulin: Secondary | ICD-10-CM | POA: Insufficient documentation

## 2019-11-25 DIAGNOSIS — D649 Anemia, unspecified: Secondary | ICD-10-CM

## 2019-11-25 DIAGNOSIS — Z5111 Encounter for antineoplastic chemotherapy: Secondary | ICD-10-CM | POA: Diagnosis present

## 2019-11-25 DIAGNOSIS — C8310 Mantle cell lymphoma, unspecified site: Secondary | ICD-10-CM | POA: Insufficient documentation

## 2019-11-25 DIAGNOSIS — J849 Interstitial pulmonary disease, unspecified: Secondary | ICD-10-CM

## 2019-11-25 DIAGNOSIS — C8318 Mantle cell lymphoma, lymph nodes of multiple sites: Secondary | ICD-10-CM

## 2019-11-25 DIAGNOSIS — J189 Pneumonia, unspecified organism: Secondary | ICD-10-CM

## 2019-11-25 LAB — CBC WITH DIFFERENTIAL (CANCER CENTER ONLY)
Abs Immature Granulocytes: 0.18 10*3/uL — ABNORMAL HIGH (ref 0.00–0.07)
Basophils Absolute: 0 10*3/uL (ref 0.0–0.1)
Basophils Relative: 1 %
Eosinophils Absolute: 0.1 10*3/uL (ref 0.0–0.5)
Eosinophils Relative: 2 %
HCT: 31.8 % — ABNORMAL LOW (ref 39.0–52.0)
Hemoglobin: 10.4 g/dL — ABNORMAL LOW (ref 13.0–17.0)
Immature Granulocytes: 5 %
Lymphocytes Relative: 5 %
Lymphs Abs: 0.2 10*3/uL — ABNORMAL LOW (ref 0.7–4.0)
MCH: 29.1 pg (ref 26.0–34.0)
MCHC: 32.7 g/dL (ref 30.0–36.0)
MCV: 89.1 fL (ref 80.0–100.0)
Monocytes Absolute: 0.2 10*3/uL (ref 0.1–1.0)
Monocytes Relative: 6 %
Neutro Abs: 2.8 10*3/uL (ref 1.7–7.7)
Neutrophils Relative %: 81 %
Platelet Count: 102 10*3/uL — ABNORMAL LOW (ref 150–400)
RBC: 3.57 MIL/uL — ABNORMAL LOW (ref 4.22–5.81)
RDW: 15.7 % — ABNORMAL HIGH (ref 11.5–15.5)
WBC Count: 3.5 10*3/uL — ABNORMAL LOW (ref 4.0–10.5)
nRBC: 0 % (ref 0.0–0.2)

## 2019-11-25 LAB — CMP (CANCER CENTER ONLY)
ALT: 32 U/L (ref 0–44)
AST: 21 U/L (ref 15–41)
Albumin: 3.2 g/dL — ABNORMAL LOW (ref 3.5–5.0)
Alkaline Phosphatase: 79 U/L (ref 38–126)
Anion gap: 11 (ref 5–15)
BUN: 13 mg/dL (ref 8–23)
CO2: 27 mmol/L (ref 22–32)
Calcium: 8.7 mg/dL — ABNORMAL LOW (ref 8.9–10.3)
Chloride: 105 mmol/L (ref 98–111)
Creatinine: 0.83 mg/dL (ref 0.61–1.24)
GFR, Est AFR Am: >60 mL/min (ref >60)
GFR, Estimated: >60 mL/min (ref >60)
Glucose, Bld: 245 mg/dL — ABNORMAL HIGH (ref 70–99)
Potassium: 3.7 mmol/L (ref 3.5–5.1)
Sodium: 143 mmol/L (ref 135–145)
Total Bilirubin: 0.7 mg/dL (ref 0.3–1.2)
Total Protein: 5.5 g/dL — ABNORMAL LOW (ref 6.5–8.1)

## 2019-11-25 LAB — TYPE AND SCREEN
ABO/RH(D): O NEG
Antibody Screen: NEGATIVE

## 2019-11-26 LAB — ABO/RH: ABO/RH(D): O NEG

## 2019-11-28 ENCOUNTER — Other Ambulatory Visit: Payer: Self-pay | Admitting: Hematology

## 2019-11-28 ENCOUNTER — Telehealth: Payer: Self-pay

## 2019-11-28 ENCOUNTER — Telehealth: Payer: Self-pay | Admitting: Hematology

## 2019-11-28 ENCOUNTER — Other Ambulatory Visit: Payer: Self-pay

## 2019-11-28 MED ORDER — PREDNISONE 10 MG PO TABS: ORAL_TABLET | ORAL | 0 refills | Status: DC

## 2019-11-28 MED ORDER — SULFAMETHOXAZOLE-TRIMETHOPRIM 800-160 MG PO TABS
1.0000 | ORAL_TABLET | ORAL | 1 refills | Status: DC
Start: 2019-11-28 — End: 2019-12-19

## 2019-11-28 NOTE — Telephone Encounter (Signed)
Scheduled appt per 6*4 los.  Called pt and he is aware of his appt date and time.

## 2019-11-28 NOTE — Patient Outreach (Signed)
[facility]) Care Management  11/28/2019  Wandell Scullion 08-18-1950 [id number]   Red emmi 2nd outreach attempt.  Placed call to patient with no answer. Left a message requesting a call back.  PLAN:  Will attempt again in 3 days.  Tomasa Rand, RN, BSN, CEN [facility] [employer] [phone]

## 2019-11-28 NOTE — Telephone Encounter (Signed)
Received call from patient regarding Prednisone and Sulfur prescription, advised patient these have been sent by Dr. Irene Limbo to his pharmacy today.

## 2019-12-01 ENCOUNTER — Other Ambulatory Visit: Payer: Self-pay

## 2019-12-01 NOTE — Patient Outreach (Signed)
[facility]) Care Management  12/01/2019  Zafar Debrosse 22-Oct-1950 [id number]   Red Emmi: Placed call to patient with no answer. This is the 3rd unsuccessful outreach.  PLAN:Will close case on 12/08/2019 if no response.  Tomasa Rand, RN, BSN, CEN [facility] [employer] [phone]

## 2019-12-02 ENCOUNTER — Inpatient Hospital Stay: Payer: Medicare Other | Admitting: Primary Care

## 2019-12-08 ENCOUNTER — Other Ambulatory Visit: Payer: Self-pay

## 2019-12-08 NOTE — Patient Outreach (Signed)
[facility]) Care Management  12/08/2019  Tonnie Friedel [birth year] [id number]   Case closure:  No response to telephone outreaches or letter.  PLAN: case closure as unable to reach.  Tomasa Rand, RN, BSN, CEN [facility] [employer] [phone]

## 2019-12-09 ENCOUNTER — Telehealth: Payer: Self-pay | Admitting: Acute Care

## 2019-12-09 ENCOUNTER — Ambulatory Visit (INDEPENDENT_AMBULATORY_CARE_PROVIDER_SITE_OTHER): Payer: Medicare Other | Admitting: Acute Care

## 2019-12-09 ENCOUNTER — Other Ambulatory Visit: Payer: Self-pay

## 2019-12-09 ENCOUNTER — Encounter: Payer: Self-pay | Admitting: Acute Care

## 2019-12-09 ENCOUNTER — Ambulatory Visit (INDEPENDENT_AMBULATORY_CARE_PROVIDER_SITE_OTHER)
Admission: RE | Admit: 2019-12-09 | Discharge: 2019-12-09 | Disposition: A | Discharge status: Home / Self Care | Payer: Medicare Other | Source: Ambulatory Visit | Attending: Acute Care | Admitting: Acute Care

## 2019-12-09 VITALS — BP 142/78 | HR 83 | Temp 98.7°F | Ht 64.0 in | Wt 195.0 lb

## 2019-12-09 DIAGNOSIS — Z8701 Personal history of pneumonia (recurrent): Secondary | ICD-10-CM

## 2019-12-09 DIAGNOSIS — C8310 Mantle cell lymphoma, unspecified site: Secondary | ICD-10-CM | POA: Diagnosis not present

## 2019-12-09 MED ORDER — ZITHROMAX Z-PAK 250 MG PO TABS: ORAL_TABLET | ORAL | 0 refills | Status: DC

## 2019-12-09 NOTE — Progress Notes (Signed)
History of Present Illness Dean Dixon is a 69 y.o. male 69 y/o M with Mantle Cell Lymphoma (diagnosed in 05/2019),  He is being  treated by Dr. Irene Limbo with Bendamustine + Rituxan.  admitted on 5/23 with 2 week history of shortness of breath.  CTA of the chest negative for PE but showed diffuse bilateral ground glass opacities. Suspected rituxan induced pulmonary toxicity. He was admitted to [facility] long and treated with steroids and antibiotics. He was  seen by PCCM as an inpatient. ( Dr. Halford Chessman and Noe Gens) He presents for follow up.   Admit date: 11/13/2019 Discharge date: 11/17/2019  Admission History/ significant events: Presented 11/13/2019 with oxygen sats of 81% on RA at rest, slightly tachycardic, tachypneic, and with stable blood pressure. Chest x-ray >> patchy hazy opacities bilaterally. CTA chest >> negative for PE but  notable for diffuse bilateral hazy groundglass opacities that could reflect atypical infection or edema. Chemistry panel with glucose 197. CBC with stable normocytic anemia. Lactic acid  normal. Troponin negative x2 and BNP  normal. Blood cultures were collected in the ED and the patient was admitted and treated for acute hypoxic respiratory failure with hypoxia. This was  thought to be  rituxan induced pulmonary toxicity due to  therapy which he was receiving for his for his Mantle Cell Lymphoma. He was treated with  vancomycin, cefepime,  Azithromycin, and Solumedrol . Atypical infection was ruled out, Vanc was discontinued, and patient ws seen by pulmonary. He was requiring 5 L/min of supplemental oxygen.     12/09/2019  Pt. Presents for hospital follow up.He improved on steroids, and was weaned to RA prior to discharge. He states he is  feeling better. He was discharged 5/27 And has had follow up with Dr. Irene Limbo, his oncologist, who ordered a prolonged prednisone taper and Bactrim 1 tab  3 times a week on which he started on  11/25/2019. He states his cough has  resolved. He denies fever since being started on Bactrim 3 times a week. His energy level is better and his appetite is good. He is trying to walk daily. CXR today 6/18 shows Mildly worsened opacity in the left base could represent atelectasis or infiltrate. Clinically he states he feels much better.  He denies any fever, chest pain, orthopnea or hemoptysis.    Test Results: CXR 12/09/2019 IMPRESSION: Mildly worsened opacity in the left base could represent atelectasis or infiltrate. Recommend short-term follow-up to ensure resolution.   CTA Chest 5/24 >> motion degraded, diffuse bilateral hazy ground-glass airspace opacities and splenomegaly   ECHO 5/24 >> LVEF 50-55%, LV has low normal function, no RWMA, grade I diastolic dysfunction, RV systolic function normal, LA/RA normal in size   Hypersensitivity Pneumonitis Panel 11/15/2019. A.Fumigatus #1 Abs Negative Negative   Micropolyspora faeni, IgG Negative Negative   Thermoactinomyces vulgaris, IgG Negative Negative   A. Pullulans Abs Negative Negative   Thermoact. Saccharii Negative Negative   Pigeon Serum Abs Negative Negative       Micro Data:  COVID 5/23 >> negative  MRSA PCR 5/24 >> negative  RVP 5/24 >> negative  BCx2 5/23 >>   Antimicrobials:  Vancomycin 5/24 x1  Azithro 5/24 >>  Cefepime 5/24 >>    CBC Latest Ref Rng & Units 11/25/2019 11/17/2019 11/16/2019  WBC 4.0 - 10.5 K/uL 3.5(L) 6.4 6.7  Hemoglobin 13.0 - 17.0 g/dL 10.4(L) 9.1(L) 9.2(L)  Hematocrit 39 - 52 % 31.8(L) 29.7(L) 29.5(L)  Platelets 150 - 400 K/uL 102(L) 127(L) 142(L)  BMP Latest Ref Rng & Units 11/25/2019 11/17/2019 11/16/2019  Glucose 70 - 99 mg/dL 245(H) 206(H) 328(H)  BUN 8 - 23 mg/dL 13 25(H) 22  Creatinine 0.61 - 1.24 mg/dL 0.83 0.76 0.78  Sodium 135 - 145 mmol/L 143 137 135  Potassium 3.5 - 5.1 mmol/L 3.7 3.9 4.4  Chloride 98 - 111 mmol/L 105 105 107  CO2 22 - 32 mmol/L 27 24 20(L)  Calcium 8.9 - 10.3 mg/dL 8.7(L) 8.8(L) 8.8(L)     BNP    Component Value Date/Time   BNP 47.6 11/13/2019 2144    ProBNP No results found for: PROBNP  PFT No results found for: FEV1PRE, FEV1POST, FVCPRE, FVCPOST, TLC, DLCOUNC, PREFEV1FVCRT, PSTFEV1FVCRT  DG Chest 2 View  Result Date: 11/10/2019 CLINICAL DATA:  Mantle cell lymphoma.  Fever and shortness of breath EXAM: CHEST - 2 VIEW COMPARISON:  Oct 23, 2014 chest radiograph and chest CT FINDINGS: The lungs are clear. The heart size and pulmonary vascularity are normal. No adenopathy. There is degenerative change in the thoracic spine. IMPRESSION: Lungs clear.  Cardiac silhouette within normal limits. Electronically Signed   By: Lowella Grip III M.D.   On: 11/10/2019 10:03   CT Angio Chest PE W and/or Wo Contrast  Result Date: 11/14/2019 CLINICAL DATA:  Shortness of breath. EXAM: CT ANGIOGRAPHY CHEST WITH CONTRAST TECHNIQUE: Multidetector CT imaging of the chest was performed using the standard protocol during bolus administration of intravenous contrast. Multiplanar CT image reconstructions and MIPs were obtained to evaluate the vascular anatomy. CONTRAST:  162m OMNIPAQUE IOHEXOL 350 MG/ML SOLN COMPARISON:  PET-CT dated September 19, 2018 FINDINGS: Cardiovascular: Evaluation is limited by respiratory motion artifact. Given this limitation, there was no acute pulmonary embolism detected.The heart size is normal. There is no significant pericardial effusion. There is no evidence for thoracic aortic dissection or aneurysm. Mediastinum/Nodes: --No mediastinal or hilar lymphadenopathy. --No axillary lymphadenopathy. --No supraclavicular lymphadenopathy. --Normal thyroid gland. --The esophagus is unremarkable Lungs/Pleura: There are hazy bilateral ground-glass airspace opacities. There is some interlobular septal thickening. There are trace bilateral pleural effusions. There is atelectasis at the lung bases. There is no pneumothorax. The trachea is unremarkable. Upper Abdomen: Large cyst is again  noted in the patient's left hepatic lobe. There is persistent splenomegaly. There is a heterogeneous appearance of the splenic parenchyma which is felt to be secondary to contrast timing in the absence of left upper quadrant abdominal pain. Musculoskeletal: No chest wall abnormality. No acute or significant osseous findings. Review of the MIP images confirms the above findings. IMPRESSION: 1. Evaluation for pulmonary emboli is limited by respiratory motion artifact. Given this limitation, no acute pulmonary embolism was detected. 2. There are diffuse bilateral hazy ground-glass airspace opacities which may represent an atypical infectious process or pulmonary edema. 3. Splenomegaly. Electronically Signed   By: CConstance HolsterM.D.   On: 11/14/2019 01:36   DG CHEST PORT 1 VIEW  Result Date: 11/17/2019 CLINICAL DATA:  Respiratory failure EXAM: PORTABLE CHEST 1 VIEW COMPARISON:  Radiograph 11/16/2019, CT 11/14/2019 FINDINGS: Persistently low lung volumes with areas of basilar atelectasis. More diffuse faint hazy opacities throughout both lungs could reflect multifocal infection or edema. No visible pneumothorax or effusion. Cardiomediastinal contours are stable. No acute osseous or soft tissue abnormality. Degenerative changes are present in the imaged spine and shoulders. Nasal cannula overlies the chest wall. IMPRESSION: Persistently low lung volumes with areas basilar atelectasis Faint hazy opacities throughout both lungs similar to comparison radiograph, could reflect multifocal infection or edema. Electronically  Signed   By: Lovena Le M.D.   On: 11/17/2019 06:17   DG CHEST PORT 1 VIEW  Result Date: 11/16/2019 CLINICAL DATA:  Short of breath EXAM: PORTABLE CHEST 1 VIEW COMPARISON:  11/15/2019 FINDINGS: Improved aeration lungs with decreased bibasilar airspace disease likely atelectasis. No heart failure or effusion. No focal pneumonia. IMPRESSION: Improved aeration of the lungs with decrease in  bibasilar atelectasis/infiltrate. Electronically Signed   By: Franchot Gallo M.D.   On: 11/16/2019 09:13   DG CHEST PORT 1 VIEW  Result Date: 11/15/2019 CLINICAL DATA:  69 year old male with shortness of breath. EXAM: PORTABLE CHEST 1 VIEW COMPARISON:  CTA chest 11/14/2019 and earlier. FINDINGS: Portable AP semi upright view at 0520 hours. Continued indistinct/interstitial basilar predominant bilateral pulmonary opacity, stable from the CTA yesterday, mildly increased at the left base since 11/13/2019. Continued low lung volumes. Mediastinal contours remain normal. Visualized tracheal air column is within normal limits. No pneumothorax or pleural effusion. Paucity of bowel gas in the upper abdomen. No acute osseous abnormality identified. IMPRESSION: Continued low lung volumes with basilar predominant indistinct/interstitial opacity which could reflect a combination of atelectasis and infection versus edema. No pleural effusion. Electronically Signed   By: Genevie Ann M.D.   On: 11/15/2019 08:30   DG Chest Port 1 View  Result Date: 11/13/2019 CLINICAL DATA:  Initial evaluation for acute shortness of breath, weakness. EXAM: PORTABLE CHEST 1 VIEW COMPARISON:  Prior radiograph from 11/10/2019. FINDINGS: Exaggeration of the cardiac silhouette related AP technique and low lung volumes. Mediastinal silhouette normal. Lungs hypoinflated. Patchy and hazy opacity seen within the mid and lower lungs bilaterally, which could reflect atelectasis or possibly infiltrates. Underlying mild pulmonary interstitial congestion. No appreciable pleural effusion. No pneumothorax. No acute osseous finding. IMPRESSION: 1. Patchy and hazy opacity within the mid and lower lungs bilaterally, which could reflect atelectasis or possibly infiltrates. 2. Underlying mild pulmonary interstitial congestion. Electronically Signed   By: Jeannine Boga M.D.   On: 11/13/2019 21:36   ECHOCARDIOGRAM COMPLETE  Result Date: 11/14/2019     ECHOCARDIOGRAM REPORT   Patient Name:   Dean Dixon Date of Exam: 11/14/2019 Medical Rec #:  [id number]    Height:       64.0 in Accession #:    [id number]   Weight:       195.0 lb Date of Birth:  May 23, 1951   BSA:          1.935 m Patient Age:    44 years     BP:           158/72 mmHg Patient Gender: M            HR:           90 bpm. Exam Location:  Inpatient Procedure: 2D Echo, Cardiac Doppler and Color Doppler Indications:    Dyspnea 786.09 / R06.00  History:        Patient has no prior history of Echocardiogram examinations.                 Risk Factors:Hypertension, Diabetes and Non-Smoker.  Sonographer:    Vickie Epley RDCS Referring Phys: [id number] [place]  1. Left ventricular ejection fraction, by estimation, is 50 to 55%. The left ventricle has low normal function. The left ventricle has no regional wall motion abnormalities. Left ventricular diastolic parameters are consistent with Grade I diastolic dysfunction (impaired relaxation).  2. Right ventricular systolic function is normal. The right ventricular size is normal.  3. The mitral valve is normal in structure. No evidence of mitral valve regurgitation. No evidence of mitral stenosis.  4. The aortic valve is normal in structure. Aortic valve regurgitation is not visualized. No aortic stenosis is present. FINDINGS  Left Ventricle: Left ventricular ejection fraction, by estimation, is 50 to 55%. The left ventricle has low normal function. The left ventricle has no regional wall motion abnormalities. The left ventricular internal cavity size was normal in size. There is no left ventricular hypertrophy. Left ventricular diastolic parameters are consistent with Grade I diastolic dysfunction (impaired relaxation). Right Ventricle: The right ventricular size is normal. No increase in right ventricular wall thickness. Right ventricular systolic function is normal. Left Atrium: Left atrial size was normal in size. Right Atrium: Right atrial  size was normal in size. Pericardium: There is no evidence of pericardial effusion. Mitral Valve: The mitral valve is normal in structure. No evidence of mitral valve regurgitation. No evidence of mitral valve stenosis. Tricuspid Valve: The tricuspid valve is normal in structure. Tricuspid valve regurgitation is trivial. Aortic Valve: The aortic valve is normal in structure. Aortic valve regurgitation is not visualized. No aortic stenosis is present. Pulmonic Valve: The pulmonic valve was grossly normal. Pulmonic valve regurgitation is not visualized. Aorta: The aortic root and ascending aorta are structurally normal, with no evidence of dilitation. IAS/Shunts: The atrial septum is grossly normal.  LEFT VENTRICLE PLAX 2D LVIDd:         4.60 cm      Diastology LVIDs:         3.00 cm      LV e' lateral:   9.90 cm/s LV PW:         0.90 cm      LV E/e' lateral: 7.2 LV IVS:        0.90 cm      LV e' medial:    6.42 cm/s LVOT diam:     1.90 cm      LV E/e' medial:  11.1 LV SV:         58 LV SV Index:   30 LVOT Area:     2.84 cm  LV Volumes (MOD) LV vol d, MOD A2C: 101.0 ml LV vol d, MOD A4C: 88.6 ml LV vol s, MOD A2C: 41.0 ml LV vol s, MOD A4C: 41.8 ml LV SV MOD A2C:     60.0 ml LV SV MOD A4C:     88.6 ml LV SV MOD BP:      52.8 ml RIGHT VENTRICLE RV S prime:     17.70 cm/s TAPSE (M-mode): 2.7 cm LEFT ATRIUM             Index       RIGHT ATRIUM          Index LA diam:        3.40 cm 1.76 cm/m  RA Area:     9.02 cm LA Vol (A2C):   27.9 ml 14.42 ml/m RA Volume:   14.80 ml 7.65 ml/m LA Vol (A4C):   35.0 ml 18.08 ml/m LA Biplane Vol: 33.8 ml 17.46 ml/m  AORTIC VALVE LVOT Vmax:   118.00 cm/s LVOT Vmean:  76.800 cm/s LVOT VTI:    0.204 m  AORTA Ao Root diam: 3.30 cm MITRAL VALVE MV Area (PHT): 3.50 cm    SHUNTS MV Decel Time: 217 msec    Systemic VTI:  0.20 m MV E velocity: 71.10 cm/s  Systemic Diam: 1.90 cm MV A  velocity: 78.80 cm/s MV E/A ratio:  0.90 Kristeen Miss MD Electronically signed by Kristeen Miss MD  Signature Date/Time: 11/14/2019/4:33:03 PM    Final      Past medical hx Past Medical History:  Diagnosis Date  . Cancer (HCC)    Mantle Cell Lymphoma  . Diabetes mellitus without complication (HCC)   . Hypertension      Social History   Tobacco Use  . Smoking status: Never Smoker  . Smokeless tobacco: Never Used  Substance Use Topics  . Alcohol use: Yes    Alcohol/week: 2.0 standard drinks    Types: 2 Standard drinks or equivalent per week  . Drug use: No    Mr.Trussell reports that he has never smoked. He has never used smokeless tobacco. He reports current alcohol use of about 2.0 standard drinks of alcohol per week. He reports that he does not use drugs.  Tobacco Cessation: Never smoker  Past surgical hx, Family hx, Social hx all reviewed.  Current Outpatient Medications on File Prior to Visit  Medication Sig  . allopurinol (ZYLOPRIM) 100 MG tablet Take 1 tablet (100 mg total) by mouth 2 (two) times daily.  Marland Kitchen amLODipine (NORVASC) 10 MG tablet Take 10 mg by mouth daily.  . Ascorbic Acid (VITAMIN C) 500 MG CAPS Take 1 capsule by mouth daily.  . blood glucose meter kit and supplies Check blood sugar 4 times daily (Before breakfast, lunch, dinner, bedtime). (FOR ICD-10 E10.9, E11.9).  . CANNABIDIOL PO Take 30 mg by mouth daily.  . Coenzyme Q10 (COQ-10 PO) Take 1 capsule by mouth daily. Takes CoQ 10 w/cinnamon  . Glucosamine-Chondroitin (GLUCOSAMINE CHONDR COMPLEX PO) Take 1 tablet by mouth daily.  . Insulin Pen Needle 31G X 5 MM MISC BD Pen Needles- brand specific Inject insulin via insulin pen 6 x daily  . LORazepam (ATIVAN) 0.5 MG tablet Take 1 tablet (0.5 mg total) by mouth every 6 (six) hours as needed (Nausea or vomiting).  . metFORMIN (GLUCOPHAGE-XR) 500 MG 24 hr tablet Take 500 mg by mouth in the morning and at bedtime.   . Multiple Vitamin (MULTIVITAMIN) tablet Take 1 tablet by mouth daily.  . ondansetron (ZOFRAN) 8 MG tablet Take 1 tablet (8 mg total) by mouth 2 (two)  times daily as needed for refractory nausea / vomiting. Start on day 2 after bendamustine chemotherapy.  . predniSONE (DELTASONE) 10 MG tablet Take 4 tablets (40 mg total) by mouth daily with breakfast for 5 days, THEN 3 tablets (30 mg total) daily with breakfast for 7 days, THEN 2 tablets (20 mg total) daily with breakfast for 7 days, THEN 1 tablet (10 mg total) daily with breakfast for 15 days.  . prochlorperazine (COMPAZINE) 10 MG tablet Take 1 tablet (10 mg total) by mouth every 6 (six) hours as needed (Nausea or vomiting).  . ramipril (ALTACE) 10 MG capsule Take 10 mg by mouth daily.  Marland Kitchen sulfamethoxazole-trimethoprim (BACTRIM DS) 800-160 MG tablet Take 1 tablet by mouth 3 (three) times a week.  . insulin detemir (LEVEMIR) 100 UNIT/ML FlexPen Inject 10 Units into the skin at bedtime for 5 days.  . pantoprazole (PROTONIX) 40 MG tablet Take 1 tablet (40 mg total) by mouth daily at 12 noon for 4 days.   No current facility-administered medications on file prior to visit.     Allergies  Allergen Reactions  . Ivp Dye [Iodinated Diagnostic Agents] Shortness Of Breath    "allergic to CT scan dye", took benadryl with last scan and  tolerated well    Review Of Systems:  Constitutional:   No  weight loss, night sweats,  Fevers, chills, fatigue, or  lassitude.  HEENT:   No headaches,  Difficulty swallowing,  Tooth/dental problems, or  Sore throat,                No sneezing, itching, ear ache, nasal congestion, post nasal drip,   CV:  No chest pain,  Orthopnea, PND, swelling in lower extremities, anasarca, dizziness, palpitations, syncope.   GI  No heartburn, indigestion, abdominal pain, nausea, vomiting, diarrhea, change in bowel habits, loss of appetite, bloody stools.   Resp: + shortness of breath with exertion none  at rest.  No excess mucus, no productive cough,  No non-productive cough,  No coughing up of blood.  No change in color of mucus.  No wheezing.  No chest wall deformity  Skin: no  rash or lesions.  GU: no dysuria, change in color of urine, no urgency or frequency.  No flank pain, no hematuria   MS:  No joint pain or swelling.  No decreased range of motion.  No back pain.  Psych:  No change in mood or affect. No depression or anxiety.  No memory loss.   Vital Signs BP (!) 142/78 (BP Location: Left Arm, Cuff Size: Large)   Pulse 83   Temp 98.7 F (37.1 C) (Oral)   Ht '5\' 4"'$  (1.626 m)   Wt 195 lb (88.5 kg)   SpO2 96%   BMI 33.47 kg/m    Physical Exam:  General- No distress,  A&Ox3, pleasant ENT: No sinus tenderness, TM clear, pale nasal mucosa, no oral exudate,no post nasal drip, no LAN Cardiac: S1, S2, regular rate and rhythm, no murmur Chest: No wheeze/ rales/ dullness; no accessory muscle use, no nasal flaring, no sternal retractions, diminished per L base with rhonchi Abd.: Soft Non-tender, ND, BS +, Body mass index is 33.47 kg/m. Ext: No clubbing cyanosis, edema Neuro:  normal strength, MAE x 4, A&O x 3 Skin: No rashes, No lesions,warm and dry Psych: normal mood and behavior   Assessment/Plan  Rituxan induced pulmonary toxicity. Treating with steroid taper and Bactrim 3 times a week CXR with worsening opacity vs atelectasis Plan CXR today We will call you with results Continue your prednisone taper and Bactrim  per Dr. Irene Limbo ( Oncology) Follow up PET per Dr. Irene Limbo Omeprazole 20 mg once daily while on prednisone taper. Oxygen saturation goals are > 92% Follow up in 1 month with Dr. Halford Chessman or Judson Roch to ensure you are still doing well.  Call if you need [place] sooner. Please contact office for sooner follow up if symptoms do not improve or worsen or seek emergency care    Addendum CXR with increased infiltrate vs atelectasis per L base. I have called the patient. We will send in a z pack. He will check with oncology and make sure it is ok to stop bactrim 3 times a week to take the z pack, then he will resume Bactrim He will continue pred taper as  prescribed by oncology Follow up in 2 weeks with Judson Roch NP or Dr. Halford Chessman with CXR prior  This appointment was 40 min long with over 50% of the time in direct face-to-face patient care, assessment, plan of care, and follow-up.    Magdalen Spatz, NP 12/09/2019  11:53 AM

## 2019-12-09 NOTE — Progress Notes (Signed)
These results have been called to the patient

## 2019-12-09 NOTE — Progress Notes (Signed)
Please schedule for a 2 week follow up with CXR  stat prior to OV. Thanks

## 2019-12-09 NOTE — Patient Instructions (Addendum)
It is good to see you today. We will get a CXR today to ensure your lungs are clearing. Please go to [place]   (Across from [facility]) The x ray department is in the basement. We will call you with results Continue your prednisone taper and Bactrim  per Dr. Irene Limbo ( Oncology) Follow up PET per Dr. Irene Limbo Omeprazole 20 mg once daily while on prednisone taper. Oxygen saturation goals are > 92% Follow up in 1 month with Dr. Halford Chessman or Judson Roch to ensure you are still doing well.  Call if you need [place] sooner. Please contact office for sooner follow up if symptoms do not improve or worsen or seek emergency care

## 2019-12-09 NOTE — Telephone Encounter (Signed)
Please schedule patient for 2 week follow up with me or Dr. Halford Chessman, with CXR prior. Thanks so much

## 2019-12-12 ENCOUNTER — Telehealth: Payer: Self-pay | Admitting: *Deleted

## 2019-12-12 NOTE — Telephone Encounter (Signed)
LMTCB x1 for pt.  Will route to [facility] for further follow up.

## 2019-12-12 NOTE — Telephone Encounter (Signed)
Patient called - his lpulmonologist wants him to take a Zpack. He said that since he is already on Bactrim, he wanted to find out if he should also take the zpack. Dr. Irene Limbo informed. Contacted patient with Dr. Grier Mitts response: Zpak would cover atypical bacteria...not covered by Bactrim. If if he is not allergic and pulmonologist believes thats necessary then it could be considered. Patient verbalized understanding - states he will let pulmonologist know that it's ok to take it

## 2019-12-13 NOTE — Telephone Encounter (Signed)
ATC pt, no answer. Left message for pt to call back.  

## 2019-12-15 NOTE — Addendum Note (Signed)
Addended by: Jannette Spanner on: 12/15/2019 10:16 AM   Modules accepted: Orders

## 2019-12-15 NOTE — Telephone Encounter (Signed)
He has an appt on 7/19

## 2019-12-19 ENCOUNTER — Inpatient Hospital Stay: Payer: Medicare Other

## 2019-12-19 ENCOUNTER — Inpatient Hospital Stay (HOSPITAL_BASED_OUTPATIENT_CLINIC_OR_DEPARTMENT_OTHER): Payer: Medicare Other | Admitting: Hematology

## 2019-12-19 ENCOUNTER — Other Ambulatory Visit: Payer: Self-pay

## 2019-12-19 VITALS — BP 169/78 | HR 72 | Temp 97.9°F | Resp 18 | Ht 64.0 in | Wt 196.0 lb

## 2019-12-19 DIAGNOSIS — C8318 Mantle cell lymphoma, lymph nodes of multiple sites: Secondary | ICD-10-CM

## 2019-12-19 DIAGNOSIS — D72819 Decreased white blood cell count, unspecified: Secondary | ICD-10-CM

## 2019-12-19 DIAGNOSIS — J849 Interstitial pulmonary disease, unspecified: Secondary | ICD-10-CM

## 2019-12-19 DIAGNOSIS — Z5111 Encounter for antineoplastic chemotherapy: Secondary | ICD-10-CM | POA: Diagnosis not present

## 2019-12-19 LAB — CMP (CANCER CENTER ONLY)
ALT: 31 U/L (ref 0–44)
AST: 18 U/L (ref 15–41)
Albumin: 3.6 g/dL (ref 3.5–5.0)
Alkaline Phosphatase: 77 U/L (ref 38–126)
Anion gap: 12 (ref 5–15)
BUN: 16 mg/dL (ref 8–23)
CO2: 29 mmol/L (ref 22–32)
Calcium: 9.4 mg/dL (ref 8.9–10.3)
Chloride: 105 mmol/L (ref 98–111)
Creatinine: 0.9 mg/dL (ref 0.61–1.24)
GFR, Est AFR Am: >60 mL/min (ref >60)
GFR, Estimated: >60 mL/min (ref >60)
Glucose, Bld: 275 mg/dL — ABNORMAL HIGH (ref 70–99)
Potassium: 4.3 mmol/L (ref 3.5–5.1)
Sodium: 146 mmol/L — ABNORMAL HIGH (ref 135–145)
Total Bilirubin: 0.7 mg/dL (ref 0.3–1.2)
Total Protein: 5.9 g/dL — ABNORMAL LOW (ref 6.5–8.1)

## 2019-12-19 LAB — CBC WITH DIFFERENTIAL/PLATELET
Abs Immature Granulocytes: 0.02 10*3/uL (ref 0.00–0.07)
Basophils Absolute: 0 10*3/uL (ref 0.0–0.1)
Basophils Relative: 1 %
Eosinophils Absolute: 0 10*3/uL (ref 0.0–0.5)
Eosinophils Relative: 2 %
HCT: 32.5 % — ABNORMAL LOW (ref 39.0–52.0)
Hemoglobin: 10.8 g/dL — ABNORMAL LOW (ref 13.0–17.0)
Immature Granulocytes: 2 %
Lymphocytes Relative: 27 %
Lymphs Abs: 0.4 10*3/uL — ABNORMAL LOW (ref 0.7–4.0)
MCH: 29 pg (ref 26.0–34.0)
MCHC: 33.2 g/dL (ref 30.0–36.0)
MCV: 87.4 fL (ref 80.0–100.0)
Monocytes Absolute: 0.1 10*3/uL (ref 0.1–1.0)
Monocytes Relative: 11 %
Neutro Abs: 0.8 10*3/uL — ABNORMAL LOW (ref 1.7–7.7)
Neutrophils Relative %: 57 %
Platelets: 84 10*3/uL — ABNORMAL LOW (ref 150–400)
RBC: 3.72 MIL/uL — ABNORMAL LOW (ref 4.22–5.81)
RDW: 15.8 % — ABNORMAL HIGH (ref 11.5–15.5)
WBC: 1.3 10*3/uL — ABNORMAL LOW (ref 4.0–10.5)
nRBC: 0 % (ref 0.0–0.2)

## 2019-12-19 LAB — SAMPLE TO BLOOD BANK

## 2019-12-19 MED ORDER — PREDNISONE 10 MG PO TABS: ORAL_TABLET | ORAL | 0 refills | Status: AC

## 2019-12-19 NOTE — Progress Notes (Signed)
Reviewed and agree with assessment/plan.   Falicia Lizotte, MD [facility] 12/19/2019, 7:06 AM Pager:  [phone]  

## 2019-12-19 NOTE — Progress Notes (Signed)
HEMATOLOGY/ONCOLOGY CLINIC NOTE  Date of Service: 12/19/2019  Patient Care Team: Hamrick, Lorin Mercy, MD as PCP - General (Family Medicine)  CHIEF COMPLAINTS/PURPOSE OF CONSULTATION:  F/u for recently diagnosed mantle cell lymphoma  HISTORY OF PRESENTING ILLNESS:   Dean Dixon is a wonderful 69 y.o. male who has been referred to [place] by Dr Daiva Eves for evaluation and management of Lymphocytosis. The pt reports that he is doing well overall.   The pt reports that he has had a high lymphocyte count for several years but it has never been as high as now. 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 This most recent lymphocytosis was picked up by him looking at his own blood in the lab. He has not felt any differently in the last 6 months to 1 year. Pt has arthritis, tendonitis and bursitis in his shoulder. He has not had any concerns of RA and no significantly swollen joints. Pt tore an achilles tendon and then flew on a plane which lead to a blood clot. He has been on a testosterone supplement before but has not used it in a while. Pt has had diverticulitis. He has been losing weight slowly by eating better but has not noticed any unexpected weight loss. Pt does not smoke, does not share needles and has not needed any blood transfusions in the past. Pt denies any respiratory illness or infection symptoms in the last few months. He used to have an elevated IgM level about 30 years ago. He does have several fatty lipomas, a few which he has had removed, but there have been no concerns associated with them. He has recently stopped taking herbal supplements until he can figure out what is going on with his blood counts. He has been taking Glucosamine, Chondroitin & MSM as well as a 25 mg CBD gummy and 10 mg of CBD SL for his joint pain for a few years. He is currently taking Glipizide and Metformin. The Metformin did give the pt diarrhea, which has since resolved.   Most recent lab results (04/12/19) of  CBC w/diff and CMP is as follows: all values are WNL except for Glucose at 191, WBC at 30.5K, Lymphs Abs at 19.6K, Mono Abs at 3.6K, Eos Abs at 0.5K, Baso Abs at 0.3K, Abs Immature Granulocytes at 0.3K. 04/12/2019 PSA at 2.2  On review of systems, pt reports joint pain and denies rashes, fevers, chills, night sweats, unexpected weight loss, issues with swallowing, abdominal pain, bowel movement issues, leg pain, respiratory symptoms, diarrhea and any other symptoms.   On PMHx the pt reports HTN, Diabetes, Arthritis, Diverticulitis. On Social Hx the pt reports that he is a non-smoker.  INTERVAL HISTORY:   Lakshya Mcgillicuddy is a wonderful 69 y.o. male who is here for evaluation and management of Lymphocytosis. The patient's last visit with [place] was on 11/25/2019. The pt reports that he is doing well overall.  The pt reports that he feels well and his breathing has improved. He is currently taking 10 mg Prednisone per day. He has been able to walk a fair amount without experiencing SOB. Pt has been taking Bactrim, but is not taking a B-complex vitamin. His fasting blood glucose levels are around 130.   Lab results today (12/19/19) of CBC w/diff and CMP is as follows: all values are WNL except for WBC at 1.3K, RBC at 3.72, Hgb at 10.8, HCT at 32.5, RDW at 15.8, PLT at 84K, Neutro Abs at 0.8K, Lymphs Abs  at 0.4K, Sodium at 146, Glucose at 275, Total Protein at 5.9.  On review of systems, pt reports fatigue and denies leg swelling, SOB, fevers, chills, night sweats and any other symptoms.    MEDICAL HISTORY:  Past Medical History:  Diagnosis Date  . Cancer (HCC)    Mantle Cell Lymphoma  . Diabetes mellitus without complication (HCC)   . Hypertension     SURGICAL HISTORY: Past Surgical History:  Procedure Laterality Date  . APPENDECTOMY    . TONSILLECTOMY      SOCIAL HISTORY: Social History   Socioeconomic History  . Marital status: Divorced    Spouse name: Not on file  . Number of  children: Not on file  . Years of education: Not on file  . Highest education level: Not on file  Occupational History  . Not on file  Tobacco Use  . Smoking status: Never Smoker  . Smokeless tobacco: Never Used  Substance and Sexual Activity  . Alcohol use: Yes    Alcohol/week: 2.0 standard drinks    Types: 2 Standard drinks or equivalent per week  . Drug use: No  . Sexual activity: Not on file  Other Topics Concern  . Not on file  Social History Narrative  . Not on file   Social Determinants of Health   Financial Resource Strain:   . Difficulty of Paying Living Expenses:   Food Insecurity:   . Worried About Programme researcher, broadcasting/film/video in the Last Year:   . Barista in the Last Year:   Transportation Needs:   . Freight forwarder (Medical):   Marland Kitchen Lack of Transportation (Non-Medical):   Physical Activity:   . Days of Exercise per Week:   . Minutes of Exercise per Session:   Stress:   . Feeling of Stress :   Social Connections:   . Frequency of Communication with Friends and Family:   . Frequency of Social Gatherings with Friends and Family:   . Attends Religious Services:   . Active Member of Clubs or Organizations:   . Attends Banker Meetings:   Marland Kitchen Marital Status:   Intimate Partner Violence:   . Fear of Current or Ex-Partner:   . Emotionally Abused:   Marland Kitchen Physically Abused:   . Sexually Abused:     FAMILY HISTORY: No family history on file.  ALLERGIES:  is allergic to ivp dye [iodinated diagnostic agents].  MEDICATIONS:  Current Outpatient Medications  Medication Sig Dispense Refill  . amLODipine (NORVASC) 10 MG tablet Take 10 mg by mouth daily.    . Ascorbic Acid (VITAMIN C) 500 MG CAPS Take 1 capsule by mouth daily.    . blood glucose meter kit and supplies Check blood sugar 4 times daily (Before breakfast, lunch, dinner, bedtime). (FOR ICD-10 E10.9, E11.9). 1 each 0  . CANNABIDIOL PO Take 30 mg by mouth daily.    . Coenzyme Q10 (COQ-10 PO)  Take 1 capsule by mouth daily. Takes CoQ 10 w/cinnamon    . Glucosamine-Chondroitin (GLUCOSAMINE CHONDR COMPLEX PO) Take 1 tablet by mouth daily.    . insulin detemir (LEVEMIR) 100 UNIT/ML FlexPen Inject 10 Units into the skin at bedtime for 5 days. 3 mL 0  . Insulin Pen Needle 31G X 5 MM MISC BD Pen Needles- brand specific Inject insulin via insulin pen 6 x daily 30 each 0  . LORazepam (ATIVAN) 0.5 MG tablet Take 1 tablet (0.5 mg total) by mouth every 6 (six) hours as  needed (Nausea or vomiting). 30 tablet 0  . metFORMIN (GLUCOPHAGE-XR) 500 MG 24 hr tablet Take 500 mg by mouth in the morning and at bedtime.     . Multiple Vitamin (MULTIVITAMIN) tablet Take 1 tablet by mouth daily.    . ondansetron (ZOFRAN) 8 MG tablet Take 1 tablet (8 mg total) by mouth 2 (two) times daily as needed for refractory nausea / vomiting. Start on day 2 after bendamustine chemotherapy. 30 tablet 1  . pantoprazole (PROTONIX) 40 MG tablet Take 1 tablet (40 mg total) by mouth daily at 12 noon for 4 days. 4 tablet 0  . predniSONE (DELTASONE) 10 MG tablet Take 1 tablet (10 mg total) by mouth daily with breakfast for 10 days, THEN 0.5 tablets (5 mg total) daily with breakfast for 10 days, THEN 0.5 tablets (5 mg total) every other day for 10 days. 17.5 tablet 0  . prochlorperazine (COMPAZINE) 10 MG tablet Take 1 tablet (10 mg total) by mouth every 6 (six) hours as needed (Nausea or vomiting). 30 tablet 1  . ramipril (ALTACE) 10 MG capsule Take 10 mg by mouth daily.    Marland Kitchen ZITHROMAX Z-PAK 250 MG tablet Take two tablets today Then take one tablet daily until gone. Check with Oncology as we discussed  prior to starting 6 each 0   No current facility-administered medications for this visit.    REVIEW OF SYSTEMS:   A 10+ POINT REVIEW OF SYSTEMS WAS OBTAINED including neurology, dermatology, psychiatry, cardiac, respiratory, lymph, extremities, GI, GU, Musculoskeletal, constitutional, breasts, reproductive, HEENT.  All pertinent  positives are noted in the HPI.  All others are negative.   PHYSICAL EXAMINATION: ECOG PERFORMANCE STATUS: 0 - Asymptomatic  . Vitals:   12/19/19 1153  BP: (!) 169/78  Pulse: 72  Resp: 18  Temp: 97.9 F (36.6 C)  SpO2: 98%   Filed Weights   12/19/19 1153  Weight: 196 lb (88.9 kg)   .Body mass index is 33.64 kg/m.   GENERAL:alert, in no acute distress and comfortable SKIN: no acute rashes, no significant lesions EYES: conjunctiva are Dixon and non-injected, sclera anicteric OROPHARYNX: MMM, no exudates, no oropharyngeal erythema or ulceration NECK: supple, no JVD LYMPH:  no palpable lymphadenopathy in the cervical, axillary or inguinal regions LUNGS: clear to auscultation b/l with normal respiratory effort HEART: regular rate & rhythm ABDOMEN:  normoactive bowel sounds , non tender, not distended. No palpable hepatosplenomegaly.  Extremity: no pedal edema PSYCH: alert & oriented x 3 with fluent speech NEURO: no focal motor/sensory deficits  LABORATORY DATA:  I have reviewed the data as listed  . CBC Latest Ref Rng & Units 12/19/2019 11/25/2019 11/17/2019  WBC 4.0 - 10.5 K/uL 1.3(L) 3.5(L) 6.4  Hemoglobin 13.0 - 17.0 g/dL 10.8(L) 10.4(L) 9.1(L)  Hematocrit 39 - 52 % 32.5(L) 31.8(L) 29.7(L)  Platelets 150 - 400 K/uL 84(L) 102(L) 127(L)    . CMP Latest Ref Rng & Units 12/19/2019 11/25/2019 11/17/2019  Glucose 70 - 99 mg/dL 275(H) 245(H) 206(H)  BUN 8 - 23 mg/dL 16 13 25(H)  Creatinine 0.61 - 1.24 mg/dL 0.90 0.83 0.76  Sodium 135 - 145 mmol/L 146(H) 143 137  Potassium 3.5 - 5.1 mmol/L 4.3 3.7 3.9  Chloride 98 - 111 mmol/L 105 105 105  CO2 22 - 32 mmol/L '29 27 24  '$ Calcium 8.9 - 10.3 mg/dL 9.4 8.7(L) 8.8(L)  Total Protein 6.5 - 8.1 g/dL 5.9(L) 5.5(L) -  Total Bilirubin 0.3 - 1.2 mg/dL 0.7 0.7 -  Alkaline Phos  38 - 126 U/L 77 79 -  AST 15 - 41 U/L 18 21 -  ALT 0 - 44 U/L 31 32 -   06/10/2019 Flow Pathology Report ([id number]):   06/10/2019 Surgical Pathology  ([id number]):   Surgical Pathology  CASE: [id number]  PATIENT: Irven Cinnamon  Flow Pathology Report      Clinical history: Lymphocytosis      DIAGNOSIS:   -Abnormal B-cell population identified.  -See comment   COMMENT:   Analysis of the lymphoid population shows a major abnormal B-cell  population representing 74% of all lymphocytes with expression of  HLA-Dr, CD20 and CD5. This is associated with extremely dim/negative  staining for surface immunoglobulin light chains hindering assessment  and or quantitation of clonality. No CD10, CD38, or CD34 expression is  identified. The findings are most consistent with a B-cell  lymphoproliferative process. Examination of peripheral blood shows  predominance of medium and large atypical lymphoid cells characterized  by coarse chromatin, moderately abundant agranular blue cytoplasm and  occasional cells with small nucleoli. Consideration should be given to  chronic lymphocytic leukemia variant, mantle cell lymphoma, as well as  other CD5 positive B-cell lymphoproliferative processes. FISH studies  may be helpful in this regard.   GATING AND PHENOTYPIC ANALYSIS:   Gated population: Flow cytometric immunophenotyping is performed using  antibodies to the antigens listed in the table below. Electronic gates  are placed around a cell cluster displaying light scatter properties  corresponding to: lymphocytes   Abnormal Cells in gated population: 74%   Phenotype of Abnormal Cells: CD5, CD20, HLA-Dr    04/26/2019 Flow Cytometry [phone]):    05/02/2019 FISH:    WBC Neutros Lymphs  02/05/2017 9.9K 6.0K 2.6K  10/06/2017 12.6K 7.7K 3.2K  11/02/2018 12.3K 5.2K 5.6K    RADIOGRAPHIC STUDIES: I have personally reviewed the radiological images as listed and agreed with the findings in the report. DG Chest 2 View  Result Date: 12/09/2019 CLINICAL DATA:  Follow-up pneumonia EXAM: CHEST - 2 VIEW COMPARISON:  Nov 17, 2019 FINDINGS: No focal opacity on the right. Mild opacity on the left has mildly worsened, particularly laterally. The heart, hila, mediastinum, lungs, and pleura are otherwise unremarkable. IMPRESSION: Mildly worsened opacity in the left base could represent atelectasis or infiltrate. Recommend short-term follow-up to ensure resolution. Electronically Signed   By: Dorise Bullion III M.D   On: 12/09/2019 13:23    ASSESSMENT & PLAN:   69 yo male with   1) Progressively increasing Lymphocytosis concerning for a lymphoproliferative. CD5+ve lymphoproliferative disorder likely CLL less likely Mantle cell lymphoma CLL FISH shows 17p mutation -04/26/2019 Flow Cytometry [id number]) revealed "Abnormal B-cell population identified. CD5+ clonal lymphoproliferative disorder" -04/26/2019 FISH revealed "Positive for p53 (17p13) deletion." -05/31/2019 PET/CT ([id number]) revealed "1. There are multiple prominent lymph nodes in the neck, both axilla, the retroperitoneum and pelvis with low level hypermetabolic activity consistent with chronic lymphocytic leukemia. Deauville 3 and 4. 2. Mild splenomegaly and mild generalized splenic hypermetabolic activity 3. No evidence of bone marrow involvement." -06/10/2019 Flow Pathology Report ([id number]) revealed "Abnormal B-cell population." -06/10/2019 Surgical Pathology Report ([id number])  revealed "Atypical lymphoid proliferation consistent with mantle cell lymphoma." -09/19/19 PET Skull Base to Thigh ([id number])-- favorable response to current treatment  2) Rigors from Rituxan - needing demerol as premedication. Rituxan rate to be capped at '150mg'$ /hour  3) Thrombocytopenia from chemotherapy  PLAN: -Discussed pt labwork today, 12/19/19; Hgb continues to improve, WBC & PLT are low, Glucose is high, other chemistries are nml -  Leukopenia and thrombocytopenia may be caused by Bactrim. Recommend pt discontinue Bactrim at this time.  -Recommend pt monitor  his blood glucose levels at home. May need to increase insulin dose.   -Recommend pt begin OTC B-complex vitamin daily and 1 mg Folic acid per day. -Recommend incentive spirometer to improve lung function -Not likely to continue RItuxan for maintenance therapy. May consider Brentuximab.  -Will continue Prednisone taper. 10 mg daily for 10 days, 5 mg daily for 10 days, 5 mg every other day for 10 days.  -Refill Prednisone  -Will repeat PET/CT in 2 weeks  -Will see back in 3 weeks with labs    FOLLOW UP: PET/CT in 2 weeks  RTC with Dr Irene Limbo with labs in 3 weeks   The total time spent in the appt was 30 minutes and more than 50% was on counseling and direct patient cares.  All of the patient's questions were answered with apparent satisfaction. The patient knows to call the clinic with any problems, questions or concerns.   Sullivan Lone MD [place] AAHIVMS [facility] Hematology/Oncology Physician [facility]  (Office):       [phone] (Work cell):  [phone] (Fax):           [phone]  12/19/2019 12:35 PM  I, Yevette Edwards, am acting as a scribe for Dr. Sullivan Lone.   .I have reviewed the above documentation for accuracy and completeness, and I agree with the above. Brunetta Genera MD

## 2020-01-02 ENCOUNTER — Ambulatory Visit (HOSPITAL_COMMUNITY)
Admission: RE | Admit: 2020-01-02 | Discharge: 2020-01-02 | Disposition: A | Discharge status: Home / Self Care | Payer: Medicare Other | Source: Ambulatory Visit | Attending: Hematology | Admitting: Hematology

## 2020-01-02 ENCOUNTER — Other Ambulatory Visit: Payer: Self-pay

## 2020-01-02 DIAGNOSIS — C8310 Mantle cell lymphoma, unspecified site: Secondary | ICD-10-CM | POA: Diagnosis not present

## 2020-01-02 DIAGNOSIS — C8318 Mantle cell lymphoma, lymph nodes of multiple sites: Secondary | ICD-10-CM | POA: Diagnosis not present

## 2020-01-02 DIAGNOSIS — D7389 Other diseases of spleen: Secondary | ICD-10-CM | POA: Diagnosis not present

## 2020-01-02 DIAGNOSIS — I700 Atherosclerosis of aorta: Secondary | ICD-10-CM | POA: Diagnosis not present

## 2020-01-02 DIAGNOSIS — I2510 Atherosclerotic heart disease of native coronary artery without angina pectoris: Secondary | ICD-10-CM | POA: Diagnosis not present

## 2020-01-02 LAB — GLUCOSE, CAPILLARY: Glucose-Capillary: 151 mg/dL — ABNORMAL HIGH (ref 70–99)

## 2020-01-02 MED ORDER — FLUDEOXYGLUCOSE F - 18 (FDG) INJECTION
10.3000 | Freq: Once | INTRAVENOUS | Status: AC | PRN
  Administered 2020-01-02: 10.3 via INTRAVENOUS

## 2020-01-09 ENCOUNTER — Telehealth: Payer: Self-pay | Admitting: Hematology

## 2020-01-09 ENCOUNTER — Encounter: Payer: Self-pay | Admitting: Acute Care

## 2020-01-09 ENCOUNTER — Ambulatory Visit (INDEPENDENT_AMBULATORY_CARE_PROVIDER_SITE_OTHER)
Admission: RE | Admit: 2020-01-09 | Discharge: 2020-01-09 | Disposition: A | Discharge status: Home / Self Care | Payer: Medicare Other | Source: Ambulatory Visit | Attending: Acute Care | Admitting: Acute Care

## 2020-01-09 ENCOUNTER — Other Ambulatory Visit: Payer: Self-pay

## 2020-01-09 ENCOUNTER — Ambulatory Visit (INDEPENDENT_AMBULATORY_CARE_PROVIDER_SITE_OTHER): Payer: Medicare Other | Admitting: Acute Care

## 2020-01-09 VITALS — BP 140/82 | HR 63 | Temp 97.3°F | Ht 64.0 in | Wt 207.8 lb

## 2020-01-09 DIAGNOSIS — J849 Interstitial pulmonary disease, unspecified: Secondary | ICD-10-CM

## 2020-01-09 DIAGNOSIS — J181 Lobar pneumonia, unspecified organism: Secondary | ICD-10-CM | POA: Diagnosis not present

## 2020-01-09 DIAGNOSIS — J189 Pneumonia, unspecified organism: Secondary | ICD-10-CM | POA: Diagnosis not present

## 2020-01-09 DIAGNOSIS — J90 Pleural effusion, not elsewhere classified: Secondary | ICD-10-CM | POA: Diagnosis not present

## 2020-01-09 NOTE — Progress Notes (Signed)
HEMATOLOGY/ONCOLOGY CLINIC NOTE  Date of Service: 01/10/2020  Patient Care Team: Hamrick, Lorin Mercy, MD as PCP - General (Family Medicine)  CHIEF COMPLAINTS/PURPOSE OF CONSULTATION:  F/u for recently diagnosed mantle cell lymphoma  HISTORY OF PRESENTING ILLNESS:   Dean Dixon is a wonderful 69 y.o. male who has been referred to [place] by Dr Daiva Eves for evaluation and management of Lymphocytosis. The pt reports that he is doing well overall.   The pt reports that he has had a high lymphocyte count for several years but it has never been as high as now. 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 This most recent lymphocytosis was picked up by him looking at his own blood in the lab. He has not felt any differently in the last 6 months to 1 year. Pt has arthritis, tendonitis and bursitis in his shoulder. He has not had any concerns of RA and no significantly swollen joints. Pt tore an achilles tendon and then flew on a plane which lead to a blood clot. He has been on a testosterone supplement before but has not used it in a while. Pt has had diverticulitis. He has been losing weight slowly by eating better but has not noticed any unexpected weight loss. Pt does not smoke, does not share needles and has not needed any blood transfusions in the past. Pt denies any respiratory illness or infection symptoms in the last few months. He used to have an elevated IgM level about 30 years ago. He does have several fatty lipomas, a few which he has had removed, but there have been no concerns associated with them. He has recently stopped taking herbal supplements until he can figure out what is going on with his blood counts. He has been taking Glucosamine, Chondroitin & MSM as well as a 25 mg CBD gummy and 10 mg of CBD SL for his joint pain for a few years. He is currently taking Glipizide and Metformin. The Metformin did give the pt diarrhea, which has since resolved.   Most recent lab results (04/12/19) of  CBC w/diff and CMP is as follows: all values are WNL except for Glucose at 191, WBC at 30.5K, Lymphs Abs at 19.6K, Mono Abs at 3.6K, Eos Abs at 0.5K, Baso Abs at 0.3K, Abs Immature Granulocytes at 0.3K. 04/12/2019 PSA at 2.2  On review of systems, pt reports joint pain and denies rashes, fevers, chills, night sweats, unexpected weight loss, issues with swallowing, abdominal pain, bowel movement issues, leg pain, respiratory symptoms, diarrhea and any other symptoms.   On PMHx the pt reports HTN, Diabetes, Arthritis, Diverticulitis. On Social Hx the pt reports that he is a non-smoker.  INTERVAL HISTORY:   Dean Dixon is a wonderful 69 y.o. male who is here for evaluation and management of Mantle Cell Lymphoma. The patient's last visit with [place] was on 12/19/2019. The pt reports that he is doing well overall.  The pt reports that he is currently on 10 mg Prednisone, but will be transitioning to 5 mg per day soon. He has discontinued Bactrim and is now taking Folic acid and B-complex vitamin daily. He has not felt very SOB and his oxygen levels have been good at home. Pt saw Dr. Halford Chessman yesterday and had a Chest XR which looked good. Pt has noticed an increase in his appetite.   Pt plans to participate in international travel soon.   Of note since the patient's last visit, pt has had PET/CT ([id number])  completed on 01/02/2020 with results revealing "1. Continued further decrease in size and FDG accumulation in index cervical, axillary, retroperitoneal, and pelvic lymph nodes. No lymphadenopathy by size criteria today. FDG accumulation in these lymph nodes is compatible with Deauville 2 category. 2. Interval decrease in size with resolution of hypermetabolism associated with the spleen previously."  Lab results today (01/10/20) of CBC w/diff and CMP is as follows: all values are WNL except for WBC at 1.3K, RBC at 3.73, Hgb at 10.8, HCT at 32.8, RDW at 16.3, PLT at 99K, Neutro Abs at 0.6K, Lymphs Abs at  0.3K, Abs Immature Granulocytes at 0.13K, Glucose at 136, Total Protein at 6.1. 01/10/2020 LDH at 257  On review of systems, pt denies fevers, chills, mouth sores, rashes, SOB, abdominal pain, constipation, low appetite and any other symptoms.     MEDICAL HISTORY:  Past Medical History:  Diagnosis Date  . Cancer ([place])    Mantle Cell Lymphoma  . Diabetes mellitus without complication ([place])   . Hypertension     SURGICAL HISTORY: Past Surgical History:  Procedure Laterality Date  . APPENDECTOMY    . TONSILLECTOMY      SOCIAL HISTORY: Social History   Socioeconomic History  . Marital status: Divorced    Spouse name: Not on file  . Number of children: Not on file  . Years of education: Not on file  . Highest education level: Not on file  Occupational History  . Not on file  Tobacco Use  . Smoking status: Never Smoker  . Smokeless tobacco: Never Used  Substance and Sexual Activity  . Alcohol use: Yes    Alcohol/week: 2.0 standard drinks    Types: 2 Standard drinks or equivalent per week  . Drug use: No  . Sexual activity: Not on file  Other Topics Concern  . Not on file  Social History Narrative  . Not on file   Social Determinants of Health   Financial Resource Strain:   . Difficulty of Paying Living Expenses:   Food Insecurity:   . Worried About Charity fundraiser in the Last Year:   . Arboriculturist in the Last Year:   Transportation Needs:   . Film/video editor (Medical):   Marland Kitchen Lack of Transportation (Non-Medical):   Physical Activity:   . Days of Exercise per Week:   . Minutes of Exercise per Session:   Stress:   . Feeling of Stress :   Social Connections:   . Frequency of Communication with Friends and Family:   . Frequency of Social Gatherings with Friends and Family:   . Attends Religious Services:   . Active Member of Clubs or Organizations:   . Attends Archivist Meetings:   Marland Kitchen Marital Status:   Intimate Partner Violence:   .  Fear of Current or Ex-Partner:   . Emotionally Abused:   Marland Kitchen Physically Abused:   . Sexually Abused:     FAMILY HISTORY: No family history on file.  ALLERGIES:  is allergic to ivp dye [iodinated diagnostic agents].  MEDICATIONS:  Current Outpatient Medications  Medication Sig Dispense Refill  . amLODipine (NORVASC) 10 MG tablet Take 10 mg by mouth daily.    . Ascorbic Acid (VITAMIN C) 500 MG CAPS Take 1 capsule by mouth daily.    . blood glucose meter kit and supplies Check blood sugar 4 times daily (Before breakfast, lunch, dinner, bedtime). (FOR ICD-10 E10.9, E11.9). 1 each 0  . CANNABIDIOL PO Take 30  mg by mouth daily.    . Coenzyme Q10 (COQ-10 PO) Take 1 capsule by mouth daily. Takes CoQ 10 w/cinnamon    . Glucosamine-Chondroitin (GLUCOSAMINE CHONDR COMPLEX PO) Take 1 tablet by mouth daily.    . insulin detemir (LEVEMIR) 100 UNIT/ML FlexPen Inject 10 Units into the skin at bedtime for 5 days. 3 mL 0  . Insulin Pen Needle 31G X 5 MM MISC BD Pen Needles- brand specific Inject insulin via insulin pen 6 x daily 30 each 0  . LORazepam (ATIVAN) 0.5 MG tablet Take 1 tablet (0.5 mg total) by mouth every 6 (six) hours as needed (Nausea or vomiting). 30 tablet 0  . metFORMIN (GLUCOPHAGE-XR) 500 MG 24 hr tablet Take 500 mg by mouth in the morning and at bedtime.     . Multiple Vitamin (MULTIVITAMIN) tablet Take 1 tablet by mouth daily.    . ondansetron (ZOFRAN) 8 MG tablet Take 1 tablet (8 mg total) by mouth 2 (two) times daily as needed for refractory nausea / vomiting. Start on day 2 after bendamustine chemotherapy. 30 tablet 1  . pantoprazole (PROTONIX) 40 MG tablet Take 1 tablet (40 mg total) by mouth daily at 12 noon for 4 days. 4 tablet 0  . predniSONE (DELTASONE) 10 MG tablet Take 1 tablet (10 mg total) by mouth daily with breakfast for 10 days, THEN 0.5 tablets (5 mg total) daily with breakfast for 10 days, THEN 0.5 tablets (5 mg total) every other day for 10 days. 17.5 tablet 0  .  prochlorperazine (COMPAZINE) 10 MG tablet Take 1 tablet (10 mg total) by mouth every 6 (six) hours as needed (Nausea or vomiting). 30 tablet 1  . ramipril (ALTACE) 10 MG capsule Take 10 mg by mouth daily.    Marland Kitchen ZITHROMAX Z-PAK 250 MG tablet Take two tablets today Then take one tablet daily until gone. Check with Oncology as we discussed  prior to starting 6 each 0   No current facility-administered medications for this visit.    REVIEW OF SYSTEMS:   A 10+ POINT REVIEW OF SYSTEMS WAS OBTAINED including neurology, dermatology, psychiatry, cardiac, respiratory, lymph, extremities, GI, GU, Musculoskeletal, constitutional, breasts, reproductive, HEENT.  All pertinent positives are noted in the HPI.  All others are negative.   PHYSICAL EXAMINATION: ECOG PERFORMANCE STATUS: 0 - Asymptomatic  . Vitals:   01/10/20 0846 01/10/20 0847  BP: (!) 167/74 (!) 161/79  Pulse: 64   Resp: 18   Temp: (!) 97.5 F (36.4 C)   SpO2: 100%    Filed Weights   01/10/20 0846  Weight: 210 lb 8 oz (95.5 kg)   .Body mass index is 36.13 kg/m.   GENERAL:alert, in no acute distress and comfortable SKIN: no acute rashes, no significant lesions EYES: conjunctiva are pink and non-injected, sclera anicteric OROPHARYNX: MMM, no exudates, no oropharyngeal erythema or ulceration NECK: supple, no JVD LYMPH:  no palpable lymphadenopathy in the cervical, axillary or inguinal regions LUNGS: clear to auscultation b/l with normal respiratory effort HEART: regular rate & rhythm ABDOMEN:  normoactive bowel sounds , non tender, not distended. No palpable hepatosplenomegaly.  Extremity: no pedal edema PSYCH: alert & oriented x 3 with fluent speech NEURO: no focal motor/sensory deficits  LABORATORY DATA:  I have reviewed the data as listed  . CBC Latest Ref Rng & Units 01/10/2020 12/19/2019 11/25/2019  WBC 4.0 - 10.5 K/uL 1.3(L) 1.3(L) 3.5(L)  Hemoglobin 13.0 - 17.0 g/dL 10.8(L) 10.8(L) 10.4(L)  Hematocrit 39 - 52 % 32.8(L)  32.5(L) 31.8(L)  Platelets 150 - 400 K/uL 99(L) 84(L) 102(L)    . CMP Latest Ref Rng & Units 01/10/2020 12/19/2019 11/25/2019  Glucose 70 - 99 mg/dL 136(H) 275(H) 245(H)  BUN 8 - 23 mg/dL _0 Creatinine 0.61 - 1.24 mg/dL 0.83 0.90 0.83  Sodium 135 - 145 mmol/L 143 146(H) 143  Potassium 3.5 - 5.1 mmol/L 4.0 4.3 3.7  Chloride 98 - 111 mmol/L 108 105 105  CO2 22 - 32 mmol/L _1 Calcium 8.9 - 10.3 mg/dL 9.1 9.4 8.7(L)  Total Protein 6.5 - 8.1 g/dL 6.1(L) 5.9(L) 5.5(L)  Total Bilirubin 0.3 - 1.2 mg/dL 0.7 0.7 0.7  Alkaline Phos 38 - 126 U/L 67 77 79  AST 15 - 41 U/L _2 ALT 0 - 44 U/L 22 31 32   06/10/2019 Flow Pathology Report ([id number]):   06/10/2019 Surgical Pathology ([id number]):   Surgical Pathology  CASE: [id number]  PATIENT: Dean Dixon  Flow Pathology Report      Clinical history: Lymphocytosis      DIAGNOSIS:   -Abnormal B-cell population identified.  -See comment   COMMENT:   Analysis of the lymphoid population shows a major abnormal B-cell  population representing 74% of all lymphocytes with expression of  HLA-Dr, CD20 and CD5. This is associated with extremely dim/negative  staining for surface immunoglobulin light chains hindering assessment  and or quantitation of clonality. No CD10, CD38, or CD34 expression is  identified. The findings are most consistent with a B-cell  lymphoproliferative process. Examination of peripheral blood shows  predominance of medium and large atypical lymphoid cells characterized  by coarse chromatin, moderately abundant agranular blue cytoplasm and  occasional cells with small nucleoli. Consideration should be given to  chronic lymphocytic leukemia variant, mantle cell lymphoma, as well as  other CD5 positive B-cell lymphoproliferative processes. FISH studies  may be helpful in this regard.   GATING AND PHENOTYPIC ANALYSIS:   Gated population: Flow cytometric immunophenotyping is  performed using  antibodies to the antigens listed in the table below. Electronic gates  are placed around a cell cluster displaying light scatter properties  corresponding to: lymphocytes   Abnormal Cells in gated population: 74%   Phenotype of Abnormal Cells: CD5, CD20, HLA-Dr    04/26/2019 Flow Cytometry [id number]):    05/02/2019 FISH:    WBC Neutros Lymphs  02/05/2017 9.9K 6.0K 2.6K  10/06/2017 12.6K 7.7K 3.2K  11/02/2018 12.3K 5.2K 5.6K    RADIOGRAPHIC STUDIES: I have personally reviewed the radiological images as listed and agreed with the findings in the report. DG Chest 2 View  Result Date: 01/09/2020 CLINICAL DATA:  Pneumonia. EXAM: CHEST - 2 VIEW COMPARISON:  December 09, 2019. FINDINGS: The heart size and mediastinal contours are within normal limits. No pneumothorax or pleural effusion is noted. Right lung is clear. Stable left basilar opacity is noted which is most consistent with subsegmental atelectasis or scarring. The visualized skeletal structures are unremarkable. IMPRESSION: Stable left basilar subsegmental atelectasis or scarring. Electronically Signed   By: Marijo Conception M.D.   On: 01/09/2020 13:57   NM PET Image Restag (PS) Skull Base To Thigh  Result Date: 01/02/2020 CLINICAL DATA:  Subsequent treatment strategy for mantle cell lymphoma. EXAM: NUCLEAR MEDICINE PET SKULL BASE TO THIGH TECHNIQUE: 10.3 mCi F-18 FDG was injected intravenously. Full-ring PET imaging was performed from the skull base to thigh after the radiotracer. CT data was obtained and used for attenuation correction and anatomic localization.  Fasting blood glucose: 151 mg/dl COMPARISON:  PET-CT 09/19/2019 FINDINGS: Mediastinal blood pool activity: SUV max 2.2 Liver activity: SUV max 3.5 NECK: The level II and III node seen on the previous study have decreased further in size in the interval. Index left-sided node measures 4 mm short axis today on 33/4 with SUV max = 2.1. No new hypermetabolic  lymphadenopathy in the neck. Incidental CT findings: none CHEST: Index right axillary node measured previously at 7 mm short axis is 4 mm short axis today ( SUV max = 0.7). Index left axillary node measured previously at 17 mm is 11 mm today with SUV max = 0.9 decreased from 4.2. No new hypermetabolic lymphadenopathy in the chest. Subtle focus of soft tissue attenuation in the subcutaneous fat of the anterior lower right chest wall (image 03/4) is hypermetabolic with SUV max = 2.5. This is new in the interval. Incidental CT findings: Coronary artery calcification is evident. Basilar atelectasis noted left greater than right. ABDOMEN/PELVIS: Interval decrease in splenic size with similar FDG uptake in splenic parenchyma to background liver uptake SUV max = 3.0 today compared to 6.0 previously. The small retroperitoneal lymph nodes seen previously have decreased in size in the interval. Index left para-aortic node measured previously at 9 mm short axis is now 5 mm short axis on 122/4. SUV max = 1.1 Index 12 mm right external iliac node identified previously is now 7 mm short axis with SUV max = 1.2 compared to 4.2 previously. Index left axillary node measured previously at 10 mm short axis is now 6 mm with SUV max = 0.9 compared to 3.9 previously. No new hypermetabolic lymphadenopathy in the abdomen or pelvis. Incidental CT findings: There is abdominal aortic atherosclerosis without aneurysm. Left colonic diverticulosis evident without diverticulitis. SKELETON: No focal hypermetabolic activity to suggest skeletal metastasis. Incidental CT findings: No worrisome lytic or sclerotic osseous abnormality. IMPRESSION: 1. Continued further decrease in size and FDG accumulation in index cervical, axillary, retroperitoneal, and pelvic lymph nodes. No lymphadenopathy by size criteria today. FDG accumulation in these lymph nodes is compatible with Deauville 2 category. 2. Interval decrease in size with resolution of  hypermetabolism associated with the spleen previously. Electronically Signed   By: Misty Stanley M.D.   On: 01/02/2020 13:21    ASSESSMENT & PLAN:   69 yo male with   1) Stage IV Mantle cell cell lymphoma CLL FISH shows 17p mutation -04/26/2019 Flow Cytometry [phone]) revealed "Abnormal B-cell population identified. CD5+ clonal lymphoproliferative disorder" -04/26/2019 FISH revealed "Positive for p53 (17p13) deletion." -05/31/2019 PET/CT ([id number]) revealed "1. There are multiple prominent lymph nodes in the neck, both axilla, the retroperitoneum and pelvis with low level hypermetabolic activity consistent with chronic lymphocytic leukemia. Deauville 3 and 4. 2. Mild splenomegaly and mild generalized splenic hypermetabolic activity 3. No evidence of bone marrow involvement." -06/10/2019 Flow Pathology Report ([id number]) revealed "Abnormal B-cell population." -06/10/2019 Surgical Pathology Report ([id number])  revealed "Atypical lymphoid proliferation consistent with mantle cell lymphoma." -09/19/19 PET Skull Base to Thigh ([id number])-- favorable response to current treatment  2) Rigors from Rituxan - needing demerol as premedication. Rituxan rate to be capped at 158m/hour  3) Thrombocytopenia from chemotherapy  4) Neutropenia ? Drug related. ? Idiosyncratic reaction to Rituxan  5) Rituxan related ILD  PLAN: -Discussed pt labwork today, 01/10/20; Hgb is steady, PLT are improving, neutropenia, blood chemistries look good, LDH is elevated -Discussed 01/02/2020 PET/CT ([id number] which revealed "1. Continued further decrease in size and FDG accumulation in index cervical, axillary,  retroperitoneal, and pelvic lymph nodes. No lymphadenopathy by size criteria today. FDG accumulation in these lymph nodes is compatible with Deauville 2 category. 2. Interval decrease in size with resolution of hypermetabolism associated with the spleen previously." - pt has achieved radiographic  remission  -No lab, clinical, or radiographic evidence of Mantle Cell Lymphoma progression at this time.  -Advised pt that his neutropenia may be an idiosyncratic response from Rituxan or delayed recovery from chemotherapy.  -Discussed using G-CSF to improve neutrophil counts in light of pt's upcoming travel plans.  -Continue OTC B-complex vitamin daily and 1 mg Folic acid per day. -Will give Granix injections daily x4 -Will see back in 4 weeks with labs -if persistent neutropenia will likely need BM Bx   FOLLOW UP: Granix [place] daily x 4 doses Labs on D4 of Granix RTC with Dr Irene Limbo with labs in 4 weeks   The total time spent in the appt was 20 minutes and more than 50% was on counseling and direct patient cares.  All of the patient's questions were answered with apparent satisfaction. The patient knows to call the clinic with any problems, questions or concerns.   Sullivan Lone MD [place] AAHIVMS [facility] Hematology/Oncology Physician [facility]  (Office):       [phone] (Work cell):  [phone] (Fax):           [phone]  01/10/2020 10:17 AM  I, Yevette Edwards, am acting as a scribe for Dr. Sullivan Lone.   .I have reviewed the above documentation for accuracy and completeness, and I agree with the above. Brunetta Genera MD

## 2020-01-09 NOTE — Progress Notes (Signed)
History of Present Illness Dean Dixon is a 69 y.o. male 69 y/o M with Mantle Cell Lymphoma (diagnosed in 05/2019),  He is being  treated by Dr. Irene Limbo with Bendamustine + Rituxan.  admitted on 5/23 with 2 week history of shortness of breath. CTA of the chest negative for PE but showed diffuse bilateral ground glass opacities. Suspected rituxan induced pulmonary toxicity. He was admitted to [facility] long and treated with steroids and antibiotics. He was  seen by PCCM as an inpatient. ( Dr. Halford Chessman and Noe Gens) .   01/09/2020  Pt. Presents for follow up of pneumonia most likely 2/2 Rituxan for his lymphoma.He was hospitalized 5/23-5/27/2021. CXR done 6/18 showed residual infiltrate.  He completed his Z pack prescribed 6/18 for this.  He states  feels 100% better. He has minimal shortness of breath. He is monitoring his oxygen saturations. He continues to wean off his prednisone per hematology. He states he can take deep breaths now. He is very positive about his . He has had a PET scan done which shows improvement. He has follow up with oncology. Hew is getting ready to take a cruise to [place] . He is traveling with a PA. I have asked him to travel with masks and with caution.  He denies any fever, chest pain, orthopnea or hemoptysis.   He will go for CXR at the [facility] office as we have no x ray available at the office today.   Test Results:         CXR 01/09/2020 The heart size and mediastinal contours are within normal limits. No pneumothorax or pleural effusion is noted. Right lung is clear. Stable left basilar opacity is noted which is most consistent with subsegmental atelectasis or scarring. The visualized skeletal structures are unremarkable.  IMPRESSION: Stable left basilar subsegmental atelectasis or scarring.   PET scan 01/02/2020 Continued further decrease in size and FDG accumulation in index cervical, axillary, retroperitoneal, and pelvic lymph nodes. No lymphadenopathy by size  criteria today. FDG accumulation in these lymph nodes is compatible with Deauville 2 category. Interval decrease in size with resolution of hypermetabolism associated with the spleen previously.    CXR 12/09/2019 IMPRESSION: Mildly worsened opacity in the left base could represent atelectasis or infiltrate. Recommend short-term follow-up to ensure resolution.   CTA Chest 5/24 >>motion degraded, diffuse bilateral hazy ground-glass airspace opacities and splenomegaly   ECHO 5/24 >> LVEF 50-55%, LV has low normal function, no RWMA, grade I diastolic dysfunction, RV systolic function normal, LA/RA normal in size   Hypersensitivity Pneumonitis Panel 11/15/2019. A.Fumigatus #1 Abs Negative Negative   Micropolyspora faeni, IgG Negative Negative   Thermoactinomyces vulgaris, IgG Negative Negative   A. Pullulans Abs Negative Negative   Thermoact. Saccharii Negative Negative   Pigeon Serum Abs Negative Negative       Micro Data:  COVID 5/23 >>negative  MRSA PCR 5/24 >>negative  RVP 5/24 >>negative  BCx2 5/23 >>    CBC Latest Ref Rng & Units 12/19/2019 11/25/2019 11/17/2019  WBC 4.0 - 10.5 K/uL 1.3(L) 3.5(L) 6.4  Hemoglobin 13.0 - 17.0 g/dL 10.8(L) 10.4(L) 9.1(L)  Hematocrit 39 - 52 % 32.5(L) 31.8(L) 29.7(L)  Platelets 150 - 400 K/uL 84(L) 102(L) 127(L)    BMP Latest Ref Rng & Units 12/19/2019 11/25/2019 11/17/2019  Glucose 70 - 99 mg/dL 275(H) 245(H) 206(H)  BUN 8 - 23 mg/dL 16 13 25(H)  Creatinine 0.61 - 1.24 mg/dL 0.90 0.83 0.76  Sodium 135 - 145 mmol/L 146(H) 143 137  Potassium 3.5 - 5.1 mmol/L 4.3 3.7 3.9  Chloride 98 - 111 mmol/L 105 105 105  CO2 22 - 32 mmol/L '29 27 24  '$ Calcium 8.9 - 10.3 mg/dL 9.4 8.7(L) 8.8(L)    BNP    Component Value Date/Time   BNP 47.6 11/13/2019 2144    ProBNP No results found for: PROBNP  PFT No results found for: FEV1PRE, FEV1POST, FVCPRE, FVCPOST, TLC, DLCOUNC, PREFEV1FVCRT, PSTFEV1FVCRT  DG Chest 2 View  Result Date:  01/09/2020 CLINICAL DATA:  Pneumonia. EXAM: CHEST - 2 VIEW COMPARISON:  December 09, 2019. FINDINGS: The heart size and mediastinal contours are within normal limits. No pneumothorax or pleural effusion is noted. Right lung is clear. Stable left basilar opacity is noted which is most consistent with subsegmental atelectasis or scarring. The visualized skeletal structures are unremarkable. IMPRESSION: Stable left basilar subsegmental atelectasis or scarring. Electronically Signed   By: Marijo Conception M.D.   On: 01/09/2020 13:57   NM PET Image Restag (PS) Skull Base To Thigh  Result Date: 01/02/2020 CLINICAL DATA:  Subsequent treatment strategy for mantle cell lymphoma. EXAM: NUCLEAR MEDICINE PET SKULL BASE TO THIGH TECHNIQUE: 10.3 mCi F-18 FDG was injected intravenously. Full-ring PET imaging was performed from the skull base to thigh after the radiotracer. CT data was obtained and used for attenuation correction and anatomic localization. Fasting blood glucose: 151 mg/dl COMPARISON:  PET-CT 09/19/2019 FINDINGS: Mediastinal blood pool activity: SUV max 2.2 Liver activity: SUV max 3.5 NECK: The level II and III node seen on the previous study have decreased further in size in the interval. Index left-sided node measures 4 mm short axis today on 33/4 with SUV max = 2.1. No new hypermetabolic lymphadenopathy in the neck. Incidental CT findings: none CHEST: Index right axillary node measured previously at 7 mm short axis is 4 mm short axis today ( SUV max = 0.7). Index left axillary node measured previously at 17 mm is 11 mm today with SUV max = 0.9 decreased from 4.2. No new hypermetabolic lymphadenopathy in the chest. Subtle focus of soft tissue attenuation in the subcutaneous fat of the anterior lower right chest wall (image 68/3) is hypermetabolic with SUV max = 2.5. This is new in the interval. Incidental CT findings: Coronary artery calcification is evident. Basilar atelectasis noted left greater than right.  ABDOMEN/PELVIS: Interval decrease in splenic size with similar FDG uptake in splenic parenchyma to background liver uptake SUV max = 3.0 today compared to 6.0 previously. The small retroperitoneal lymph nodes seen previously have decreased in size in the interval. Index left para-aortic node measured previously at 9 mm short axis is now 5 mm short axis on 122/4. SUV max = 1.1 Index 12 mm right external iliac node identified previously is now 7 mm short axis with SUV max = 1.2 compared to 4.2 previously. Index left axillary node measured previously at 10 mm short axis is now 6 mm with SUV max = 0.9 compared to 3.9 previously. No new hypermetabolic lymphadenopathy in the abdomen or pelvis. Incidental CT findings: There is abdominal aortic atherosclerosis without aneurysm. Left colonic diverticulosis evident without diverticulitis. SKELETON: No focal hypermetabolic activity to suggest skeletal metastasis. Incidental CT findings: No worrisome lytic or sclerotic osseous abnormality. IMPRESSION: 1. Continued further decrease in size and FDG accumulation in index cervical, axillary, retroperitoneal, and pelvic lymph nodes. No lymphadenopathy by size criteria today. FDG accumulation in these lymph nodes is compatible with Deauville 2 category. 2. Interval decrease in size with resolution of hypermetabolism  associated with the spleen previously. Electronically Signed   By: Misty Stanley M.D.   On: 01/02/2020 13:21     Past medical hx Past Medical History:  Diagnosis Date  . Cancer ([place])    Mantle Cell Lymphoma  . Diabetes mellitus without complication ([place])   . Hypertension      Social History   Tobacco Use  . Smoking status: Never Smoker  . Smokeless tobacco: Never Used  Substance Use Topics  . Alcohol use: Yes    Alcohol/week: 2.0 standard drinks    Types: 2 Standard drinks or equivalent per week  . Drug use: No    Dean Dixon reports that he has never smoked. He has never used smokeless tobacco. He  reports current alcohol use of about 2.0 standard drinks of alcohol per week. He reports that he does not use drugs.  Tobacco Cessation: Never Smoker  Past surgical hx, Family hx, Social hx all reviewed.  Current Outpatient Medications on File Prior to Visit  Medication Sig  . amLODipine (NORVASC) 10 MG tablet Take 10 mg by mouth daily.  . Ascorbic Acid (VITAMIN C) 500 MG CAPS Take 1 capsule by mouth daily.  . blood glucose meter kit and supplies Check blood sugar 4 times daily (Before breakfast, lunch, dinner, bedtime). (FOR ICD-10 E10.9, E11.9).  . CANNABIDIOL PO Take 30 mg by mouth daily.  . Coenzyme Q10 (COQ-10 PO) Take 1 capsule by mouth daily. Takes CoQ 10 w/cinnamon  . Glucosamine-Chondroitin (GLUCOSAMINE CHONDR COMPLEX PO) Take 1 tablet by mouth daily.  . Insulin Pen Needle 31G X 5 MM MISC BD Pen Needles- brand specific Inject insulin via insulin pen 6 x daily  . LORazepam (ATIVAN) 0.5 MG tablet Take 1 tablet (0.5 mg total) by mouth every 6 (six) hours as needed (Nausea or vomiting).  . metFORMIN (GLUCOPHAGE-XR) 500 MG 24 hr tablet Take 500 mg by mouth in the morning and at bedtime.   . Multiple Vitamin (MULTIVITAMIN) tablet Take 1 tablet by mouth daily.  . ondansetron (ZOFRAN) 8 MG tablet Take 1 tablet (8 mg total) by mouth 2 (two) times daily as needed for refractory nausea / vomiting. Start on day 2 after bendamustine chemotherapy.  . predniSONE (DELTASONE) 10 MG tablet Take 1 tablet (10 mg total) by mouth daily with breakfast for 10 days, THEN 0.5 tablets (5 mg total) daily with breakfast for 10 days, THEN 0.5 tablets (5 mg total) every other day for 10 days.  . prochlorperazine (COMPAZINE) 10 MG tablet Take 1 tablet (10 mg total) by mouth every 6 (six) hours as needed (Nausea or vomiting).  . ramipril (ALTACE) 10 MG capsule Take 10 mg by mouth daily.  Marland Kitchen ZITHROMAX Z-PAK 250 MG tablet Take two tablets today Then take one tablet daily until gone. Check with Oncology as we discussed   prior to starting  . insulin detemir (LEVEMIR) 100 UNIT/ML FlexPen Inject 10 Units into the skin at bedtime for 5 days.  . pantoprazole (PROTONIX) 40 MG tablet Take 1 tablet (40 mg total) by mouth daily at 12 noon for 4 days.   No current facility-administered medications on file prior to visit.     Allergies  Allergen Reactions  . Ivp Dye [Iodinated Diagnostic Agents] Shortness Of Breath    "allergic to CT scan dye", took benadryl with last scan and tolerated well    Review Of Systems:  Constitutional:   No  weight loss, night sweats,  Fevers, chills, fatigue, or  lassitude.  HEENT:  No headaches,  Difficulty swallowing,  Tooth/dental problems, or  Sore throat,                No sneezing, itching, ear ache, nasal congestion, post nasal drip,   CV:  No chest pain,  Orthopnea, PND, swelling in lower extremities, anasarca, dizziness, palpitations, syncope.   GI  No heartburn, indigestion, abdominal pain, nausea, vomiting, diarrhea, change in bowel habits, loss of appetite, bloody stools.   Resp: Rare  shortness of breath with exertion none at rest.  No excess mucus, no productive cough,  No non-productive cough,  No coughing up of blood.  No change in color of mucus.  No wheezing.  No chest wall deformity  Skin: no rash or lesions.  GU: no dysuria, change in color of urine, no urgency or frequency.  No flank pain, no hematuria   MS:  No joint pain or swelling.  No decreased range of motion.  No back pain.  Psych:  No change in mood or affect. No depression or anxiety.  No memory loss.   Vital Signs BP 140/82 (BP Location: Left Arm, Cuff Size: Normal)   Pulse 63   Temp (!) 97.3 F (36.3 C) (Oral)   Ht '5\' 4"'$  (1.626 m)   Wt 207 lb 12.8 oz (94.3 kg)   SpO2 100%   BMI 35.67 kg/m    Physical Exam:  General- No distress,  A&Ox3, pleasant ENT: No sinus tenderness, TM clear, pale nasal mucosa, no oral exudate,no post nasal drip, no LAN Cardiac: S1, S2, regular rate and  rhythm, no murmur Chest: No wheeze/ rales/ dullness; no accessory muscle use, no nasal flaring, no sternal retractions Abd.: Soft Non-tender, ND, BS + Ext: No clubbing cyanosis, edema Neuro:  normal strength, MAE x 4, A&O x 3 Skin: No rashes, No lesions, warm and dry Psych: normal mood and behavior   Assessment/Plan  Resolved Pneumonia Residual scarring per L base. Plan Please go to [place] , basement , for CXR We will call you with results. Continue IS and deep breathing exercises Continue to slowly increase activity.  Follow up in 6 months. Follow up with oncology as is scheduled for follow up after PET scan.  Call us sooner if you need [place] sooner.  Have a great time on your cruise.     Magdalen Spatz, NP 01/09/2020  3:12 PM

## 2020-01-09 NOTE — Telephone Encounter (Signed)
Scheduled per 06/28 los, patient has been called and notified.  

## 2020-01-09 NOTE — Patient Instructions (Addendum)
It is good to see you today.  Please go to [place] , basement , for CXR We will call you with results. Follow up in 6 months. Call us sooner if you need [place] sooner.  Have a great time on your cruise.

## 2020-01-10 ENCOUNTER — Inpatient Hospital Stay (HOSPITAL_BASED_OUTPATIENT_CLINIC_OR_DEPARTMENT_OTHER): Payer: Medicare Other | Admitting: Hematology

## 2020-01-10 ENCOUNTER — Inpatient Hospital Stay: Payer: Medicare Other | Attending: Hematology

## 2020-01-10 ENCOUNTER — Inpatient Hospital Stay: Payer: Medicare Other

## 2020-01-10 ENCOUNTER — Other Ambulatory Visit: Payer: Self-pay

## 2020-01-10 ENCOUNTER — Telehealth: Payer: Self-pay | Admitting: Hematology

## 2020-01-10 DIAGNOSIS — I10 Essential (primary) hypertension: Secondary | ICD-10-CM | POA: Diagnosis not present

## 2020-01-10 DIAGNOSIS — D702 Other drug-induced agranulocytosis: Secondary | ICD-10-CM

## 2020-01-10 DIAGNOSIS — C8310 Mantle cell lymphoma, unspecified site: Secondary | ICD-10-CM | POA: Insufficient documentation

## 2020-01-10 DIAGNOSIS — C8318 Mantle cell lymphoma, lymph nodes of multiple sites: Secondary | ICD-10-CM

## 2020-01-10 DIAGNOSIS — Z5189 Encounter for other specified aftercare: Secondary | ICD-10-CM | POA: Insufficient documentation

## 2020-01-10 DIAGNOSIS — Z79899 Other long term (current) drug therapy: Secondary | ICD-10-CM | POA: Diagnosis not present

## 2020-01-10 DIAGNOSIS — E119 Type 2 diabetes mellitus without complications: Secondary | ICD-10-CM | POA: Insufficient documentation

## 2020-01-10 LAB — CBC WITH DIFFERENTIAL/PLATELET
Abs Immature Granulocytes: 0.13 10*3/uL — ABNORMAL HIGH (ref 0.00–0.07)
Basophils Absolute: 0 10*3/uL (ref 0.0–0.1)
Basophils Relative: 2 %
Eosinophils Absolute: 0.1 10*3/uL (ref 0.0–0.5)
Eosinophils Relative: 5 %
HCT: 32.8 % — ABNORMAL LOW (ref 39.0–52.0)
Hemoglobin: 10.8 g/dL — ABNORMAL LOW (ref 13.0–17.0)
Immature Granulocytes: 10 %
Lymphocytes Relative: 25 %
Lymphs Abs: 0.3 10*3/uL — ABNORMAL LOW (ref 0.7–4.0)
MCH: 29 pg (ref 26.0–34.0)
MCHC: 32.9 g/dL (ref 30.0–36.0)
MCV: 87.9 fL (ref 80.0–100.0)
Monocytes Absolute: 0.2 10*3/uL (ref 0.1–1.0)
Monocytes Relative: 14 %
Neutro Abs: 0.6 10*3/uL — ABNORMAL LOW (ref 1.7–7.7)
Neutrophils Relative %: 44 %
Platelets: 99 10*3/uL — ABNORMAL LOW (ref 150–400)
RBC: 3.73 MIL/uL — ABNORMAL LOW (ref 4.22–5.81)
RDW: 16.3 % — ABNORMAL HIGH (ref 11.5–15.5)
WBC: 1.3 10*3/uL — ABNORMAL LOW (ref 4.0–10.5)
nRBC: 0 % (ref 0.0–0.2)

## 2020-01-10 LAB — CMP (CANCER CENTER ONLY)
ALT: 22 U/L (ref 0–44)
AST: 17 U/L (ref 15–41)
Albumin: 3.7 g/dL (ref 3.5–5.0)
Alkaline Phosphatase: 67 U/L (ref 38–126)
Anion gap: 7 (ref 5–15)
BUN: 15 mg/dL (ref 8–23)
CO2: 28 mmol/L (ref 22–32)
Calcium: 9.1 mg/dL (ref 8.9–10.3)
Chloride: 108 mmol/L (ref 98–111)
Creatinine: 0.83 mg/dL (ref 0.61–1.24)
GFR, Est AFR Am: >60 mL/min (ref >60)
GFR, Estimated: >60 mL/min (ref >60)
Glucose, Bld: 136 mg/dL — ABNORMAL HIGH (ref 70–99)
Potassium: 4 mmol/L (ref 3.5–5.1)
Sodium: 143 mmol/L (ref 135–145)
Total Bilirubin: 0.7 mg/dL (ref 0.3–1.2)
Total Protein: 6.1 g/dL — ABNORMAL LOW (ref 6.5–8.1)

## 2020-01-10 LAB — LACTATE DEHYDROGENASE: LDH: 257 U/L — ABNORMAL HIGH (ref 98–192)

## 2020-01-10 MED ORDER — FILGRASTIM-AAFI 480 MCG/0.8ML IJ SOSY
480.0000 ug | PREFILLED_SYRINGE | Freq: Once | INTRAMUSCULAR | Status: AC
  Administered 2020-01-10: 480 ug via SUBCUTANEOUS
  Filled 2020-01-10: qty 0.8

## 2020-01-10 NOTE — Progress Notes (Signed)
Reviewed and agree with assessment/plan.   Chesley Mires, MD [facility] Pulmonary/Critical Care 01/10/2020, 8:25 AM Pager:  [phone]

## 2020-01-10 NOTE — Patient Instructions (Signed)

## 2020-01-10 NOTE — Progress Notes (Signed)
These results were called to the patient 7/19 by Amy CMA. The patient verbalized understanding

## 2020-01-10 NOTE — Telephone Encounter (Signed)
Scheduled per 7/20 los. Printed avs and calendar for pt.

## 2020-01-11 ENCOUNTER — Encounter: Payer: Self-pay | Admitting: Hematology

## 2020-01-11 ENCOUNTER — Inpatient Hospital Stay: Payer: Medicare Other

## 2020-01-11 ENCOUNTER — Other Ambulatory Visit: Payer: Self-pay

## 2020-01-11 VITALS — BP 139/79 | HR 73 | Temp 97.8°F | Resp 18

## 2020-01-11 DIAGNOSIS — Z5189 Encounter for other specified aftercare: Secondary | ICD-10-CM | POA: Diagnosis not present

## 2020-01-11 DIAGNOSIS — C8310 Mantle cell lymphoma, unspecified site: Secondary | ICD-10-CM | POA: Diagnosis not present

## 2020-01-11 DIAGNOSIS — E119 Type 2 diabetes mellitus without complications: Secondary | ICD-10-CM | POA: Diagnosis not present

## 2020-01-11 DIAGNOSIS — D702 Other drug-induced agranulocytosis: Secondary | ICD-10-CM

## 2020-01-11 DIAGNOSIS — C8318 Mantle cell lymphoma, lymph nodes of multiple sites: Secondary | ICD-10-CM

## 2020-01-11 DIAGNOSIS — I10 Essential (primary) hypertension: Secondary | ICD-10-CM | POA: Diagnosis not present

## 2020-01-11 DIAGNOSIS — Z79899 Other long term (current) drug therapy: Secondary | ICD-10-CM | POA: Diagnosis not present

## 2020-01-11 MED ORDER — FILGRASTIM-AAFI 480 MCG/0.8ML IJ SOSY
480.0000 ug | PREFILLED_SYRINGE | Freq: Once | INTRAMUSCULAR | Status: AC
  Administered 2020-01-11: 480 ug via SUBCUTANEOUS
  Filled 2020-01-11: qty 0.8

## 2020-01-11 NOTE — Progress Notes (Signed)
Patient expressed transportation cost concerns w/ registratar.  Reviewed notes and saw he was over the income for the grant.  Referred to social worker for concern to be addressed. Provided names and number for Abigail/Anne.

## 2020-01-12 ENCOUNTER — Inpatient Hospital Stay: Payer: Medicare Other

## 2020-01-12 ENCOUNTER — Other Ambulatory Visit: Payer: Self-pay

## 2020-01-12 VITALS — BP 149/93 | HR 80 | Temp 98.5°F | Resp 18

## 2020-01-12 DIAGNOSIS — D702 Other drug-induced agranulocytosis: Secondary | ICD-10-CM

## 2020-01-12 DIAGNOSIS — C8310 Mantle cell lymphoma, unspecified site: Secondary | ICD-10-CM | POA: Diagnosis not present

## 2020-01-12 DIAGNOSIS — Z5189 Encounter for other specified aftercare: Secondary | ICD-10-CM | POA: Diagnosis not present

## 2020-01-12 DIAGNOSIS — I10 Essential (primary) hypertension: Secondary | ICD-10-CM | POA: Diagnosis not present

## 2020-01-12 DIAGNOSIS — Z79899 Other long term (current) drug therapy: Secondary | ICD-10-CM | POA: Diagnosis not present

## 2020-01-12 DIAGNOSIS — C8318 Mantle cell lymphoma, lymph nodes of multiple sites: Secondary | ICD-10-CM

## 2020-01-12 DIAGNOSIS — E119 Type 2 diabetes mellitus without complications: Secondary | ICD-10-CM | POA: Diagnosis not present

## 2020-01-12 MED ORDER — FILGRASTIM-AAFI 480 MCG/0.8ML IJ SOSY
480.0000 ug | PREFILLED_SYRINGE | Freq: Once | INTRAMUSCULAR | Status: AC
  Administered 2020-01-12: 480 ug via SUBCUTANEOUS
  Filled 2020-01-12: qty 0.8

## 2020-01-12 NOTE — Patient Instructions (Signed)

## 2020-01-13 ENCOUNTER — Other Ambulatory Visit: Payer: Self-pay

## 2020-01-13 ENCOUNTER — Inpatient Hospital Stay: Payer: Medicare Other

## 2020-01-13 ENCOUNTER — Other Ambulatory Visit: Payer: Self-pay | Admitting: *Deleted

## 2020-01-13 VITALS — BP 156/68 | HR 77 | Resp 18

## 2020-01-13 DIAGNOSIS — C8318 Mantle cell lymphoma, lymph nodes of multiple sites: Secondary | ICD-10-CM

## 2020-01-13 DIAGNOSIS — I10 Essential (primary) hypertension: Secondary | ICD-10-CM | POA: Diagnosis not present

## 2020-01-13 DIAGNOSIS — Z79899 Other long term (current) drug therapy: Secondary | ICD-10-CM | POA: Diagnosis not present

## 2020-01-13 DIAGNOSIS — E119 Type 2 diabetes mellitus without complications: Secondary | ICD-10-CM | POA: Diagnosis not present

## 2020-01-13 DIAGNOSIS — Z5189 Encounter for other specified aftercare: Secondary | ICD-10-CM | POA: Diagnosis not present

## 2020-01-13 DIAGNOSIS — D702 Other drug-induced agranulocytosis: Secondary | ICD-10-CM

## 2020-01-13 DIAGNOSIS — C8310 Mantle cell lymphoma, unspecified site: Secondary | ICD-10-CM | POA: Diagnosis not present

## 2020-01-13 LAB — CBC WITH DIFFERENTIAL (CANCER CENTER ONLY)
Abs Immature Granulocytes: 0.16 10*3/uL — ABNORMAL HIGH (ref 0.00–0.07)
Basophils Absolute: 0 10*3/uL (ref 0.0–0.1)
Basophils Relative: 2 %
Eosinophils Absolute: 0.1 10*3/uL (ref 0.0–0.5)
Eosinophils Relative: 7 %
HCT: 33.1 % — ABNORMAL LOW (ref 39.0–52.0)
Hemoglobin: 11.2 g/dL — ABNORMAL LOW (ref 13.0–17.0)
Immature Granulocytes: 9 %
Lymphocytes Relative: 26 %
Lymphs Abs: 0.5 10*3/uL — ABNORMAL LOW (ref 0.7–4.0)
MCH: 30 pg (ref 26.0–34.0)
MCHC: 33.8 g/dL (ref 30.0–36.0)
MCV: 88.7 fL (ref 80.0–100.0)
Monocytes Absolute: 0.4 10*3/uL (ref 0.1–1.0)
Monocytes Relative: 22 %
Neutro Abs: 0.6 10*3/uL — ABNORMAL LOW (ref 1.7–7.7)
Neutrophils Relative %: 34 %
Platelet Count: 79 10*3/uL — ABNORMAL LOW (ref 150–400)
RBC: 3.73 MIL/uL — ABNORMAL LOW (ref 4.22–5.81)
RDW: 16.4 % — ABNORMAL HIGH (ref 11.5–15.5)
WBC Count: 1.8 10*3/uL — ABNORMAL LOW (ref 4.0–10.5)
nRBC: 0 % (ref 0.0–0.2)

## 2020-01-13 LAB — SAMPLE TO BLOOD BANK

## 2020-01-13 MED ORDER — FILGRASTIM-AAFI 480 MCG/0.8ML IJ SOSY
480.0000 ug | PREFILLED_SYRINGE | Freq: Once | INTRAMUSCULAR | Status: AC
  Administered 2020-01-13: 480 ug via SUBCUTANEOUS
  Filled 2020-01-13: qty 0.8

## 2020-02-06 ENCOUNTER — Other Ambulatory Visit: Payer: Self-pay | Admitting: *Deleted

## 2020-02-06 DIAGNOSIS — C8318 Mantle cell lymphoma, lymph nodes of multiple sites: Secondary | ICD-10-CM

## 2020-02-07 NOTE — Progress Notes (Signed)
HEMATOLOGY/ONCOLOGY CLINIC NOTE  Date of Service: 02/08/2020  Patient Care Team: Hamrick, Lorin Mercy, MD as PCP - General (Family Medicine)  CHIEF COMPLAINTS/PURPOSE OF CONSULTATION:  F/u for mantle cell lymphoma  HISTORY OF PRESENTING ILLNESS:   Dean Dixon is a wonderful 69 y.o. male who has been referred to [place] by Dr Daiva Eves for evaluation and management of Lymphocytosis. The pt reports that he is doing well overall.   The pt reports that he has had a high lymphocyte count for several years but it has never been as high as now. 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 This most recent lymphocytosis was picked up by him looking at his own blood in the lab. He has not felt any differently in the last 6 months to 1 year. Pt has arthritis, tendonitis and bursitis in his shoulder. He has not had any concerns of RA and no significantly swollen joints. Pt tore an achilles tendon and then flew on a plane which lead to a blood clot. He has been on a testosterone supplement before but has not used it in a while. Pt has had diverticulitis. He has been losing weight slowly by eating better but has not noticed any unexpected weight loss. Pt does not smoke, does not share needles and has not needed any blood transfusions in the past. Pt denies any respiratory illness or infection symptoms in the last few months. He used to have an elevated IgM level about 30 years ago. He does have several fatty lipomas, a few which he has had removed, but there have been no concerns associated with them. He has recently stopped taking herbal supplements until he can figure out what is going on with his blood counts. He has been taking Glucosamine, Chondroitin & MSM as well as a 25 mg CBD gummy and 10 mg of CBD SL for his joint pain for a few years. He is currently taking Glipizide and Metformin. The Metformin did give the pt diarrhea, which has since resolved.   Most recent lab results (04/12/19) of CBC w/diff and CMP is  as follows: all values are WNL except for Glucose at 191, WBC at 30.5K, Lymphs Abs at 19.6K, Mono Abs at 3.6K, Eos Abs at 0.5K, Baso Abs at 0.3K, Abs Immature Granulocytes at 0.3K. 04/12/2019 PSA at 2.2  On review of systems, pt reports joint pain and denies rashes, fevers, chills, night sweats, unexpected weight loss, issues with swallowing, abdominal pain, bowel movement issues, leg pain, respiratory symptoms, diarrhea and any other symptoms.   On PMHx the pt reports HTN, Diabetes, Arthritis, Diverticulitis. On Social Hx the pt reports that he is a non-smoker.  INTERVAL HISTORY:  Dean Dixon is a wonderful 69 y.o. male who is here for evaluation and management of Mantle Cell Lymphoma. The patient's last visit with [place] was on 01/10/2020. The pt reports that he is doing well overall.  The pt reports that he had a good time on his cruise and did his best to remain safe. Pt did quite a bit of walking during his vacation and continues to walk daily. He notes that he does feel a sense of imbalance regularly. He denies any issues breathing or any productive cough.  Lab results today (02/08/20) of CBC w/diff and CMP is as follows: all values are WNL except for WBC at 2.5K, RBC at 3.57, Hgb at 10.8, HCT at 31.5, RDW at 16.7, PLT at 121K, Neutro Abs at 1.3K, Lymphs Abs at  0.5K, Abs Immature Granulocytes at 0.12K, Glucose at 141, Total Protein at 6.3. 02/08/2020 LDH at 309  On review of systems, pt reports imbalance and denies fevers, chills, night sweats, SOB, productive cough and any other symptoms.   MEDICAL HISTORY:  Past Medical History:  Diagnosis Date  . Cancer ([place])    Mantle Cell Lymphoma  . Diabetes mellitus without complication ([place])   . Hypertension     SURGICAL HISTORY: Past Surgical History:  Procedure Laterality Date  . APPENDECTOMY    . TONSILLECTOMY      SOCIAL HISTORY: Social History   Socioeconomic History  . Marital status: Divorced    Spouse name: Not on file  .  Number of children: Not on file  . Years of education: Not on file  . Highest education level: Not on file  Occupational History  . Not on file  Tobacco Use  . Smoking status: Never Smoker  . Smokeless tobacco: Never Used  Substance and Sexual Activity  . Alcohol use: Yes    Alcohol/week: 2.0 standard drinks    Types: 2 Standard drinks or equivalent per week  . Drug use: No  . Sexual activity: Not on file  Other Topics Concern  . Not on file  Social History Narrative  . Not on file   Social Determinants of Health   Financial Resource Strain:   . Difficulty of Paying Living Expenses:   Food Insecurity:   . Worried About Charity fundraiser in the Last Year:   . Arboriculturist in the Last Year:   Transportation Needs:   . Film/video editor (Medical):   Marland Kitchen Lack of Transportation (Non-Medical):   Physical Activity:   . Days of Exercise per Week:   . Minutes of Exercise per Session:   Stress:   . Feeling of Stress :   Social Connections:   . Frequency of Communication with Friends and Family:   . Frequency of Social Gatherings with Friends and Family:   . Attends Religious Services:   . Active Member of Clubs or Organizations:   . Attends Archivist Meetings:   Marland Kitchen Marital Status:   Intimate Partner Violence:   . Fear of Current or Ex-Partner:   . Emotionally Abused:   Marland Kitchen Physically Abused:   . Sexually Abused:     FAMILY HISTORY: No family history on file.  ALLERGIES:  is allergic to ivp dye [iodinated diagnostic agents].  MEDICATIONS:  Current Outpatient Medications  Medication Sig Dispense Refill  . amLODipine (NORVASC) 10 MG tablet Take 10 mg by mouth daily.    . Ascorbic Acid (VITAMIN C) 500 MG CAPS Take 1 capsule by mouth daily.    . blood glucose meter kit and supplies Check blood sugar 4 times daily (Before breakfast, lunch, dinner, bedtime). (FOR ICD-10 E10.9, E11.9). 1 each 0  . CANNABIDIOL PO Take 30 mg by mouth daily.    . Coenzyme Q10  (COQ-10 PO) Take 1 capsule by mouth daily. Takes CoQ 10 w/cinnamon    . Glucosamine-Chondroitin (GLUCOSAMINE CHONDR COMPLEX PO) Take 1 tablet by mouth daily.    . insulin detemir (LEVEMIR) 100 UNIT/ML FlexPen Inject 10 Units into the skin at bedtime for 5 days. 3 mL 0  . Insulin Pen Needle 31G X 5 MM MISC BD Pen Needles- brand specific Inject insulin via insulin pen 6 x daily 30 each 0  . LORazepam (ATIVAN) 0.5 MG tablet Take 1 tablet (0.5 mg total) by mouth every  6 (six) hours as needed (Nausea or vomiting). 30 tablet 0  . metFORMIN (GLUCOPHAGE-XR) 500 MG 24 hr tablet Take 500 mg by mouth in the morning and at bedtime.     . Multiple Vitamin (MULTIVITAMIN) tablet Take 1 tablet by mouth daily.    . ondansetron (ZOFRAN) 8 MG tablet Take 1 tablet (8 mg total) by mouth 2 (two) times daily as needed for refractory nausea / vomiting. Start on day 2 after bendamustine chemotherapy. 30 tablet 1  . pantoprazole (PROTONIX) 40 MG tablet Take 1 tablet (40 mg total) by mouth daily at 12 noon for 4 days. 4 tablet 0  . prochlorperazine (COMPAZINE) 10 MG tablet Take 1 tablet (10 mg total) by mouth every 6 (six) hours as needed (Nausea or vomiting). 30 tablet 1  . ramipril (ALTACE) 10 MG capsule Take 10 mg by mouth daily.    Marland Kitchen ZITHROMAX Z-PAK 250 MG tablet Take two tablets today Then take one tablet daily until gone. Check with Oncology as we discussed  prior to starting 6 each 0   No current facility-administered medications for this visit.    REVIEW OF SYSTEMS:   A 10+ POINT REVIEW OF SYSTEMS WAS OBTAINED including neurology, dermatology, psychiatry, cardiac, respiratory, lymph, extremities, GI, GU, Musculoskeletal, constitutional, breasts, reproductive, HEENT.  All pertinent positives are noted in the HPI.  All others are negative.   PHYSICAL EXAMINATION: ECOG PERFORMANCE STATUS: 0 - Asymptomatic  . Vitals:   02/08/20 0909  BP: (!) 141/71  Pulse: 73  Resp: 18  Temp: 98.3 F (36.8 C)  SpO2: 100%    Filed Weights   02/08/20 0909  Weight: 213 lb (96.6 kg)   .Body mass index is 36.56 kg/m.   GENERAL:alert, in no acute distress and comfortable SKIN: no acute rashes, no significant lesions EYES: conjunctiva are pink and non-injected, sclera anicteric OROPHARYNX: MMM, no exudates, no oropharyngeal erythema or ulceration NECK: supple, no JVD LYMPH:  no palpable lymphadenopathy in the cervical, axillary or inguinal regions LUNGS: clear to auscultation b/l with normal respiratory effort HEART: regular rate & rhythm ABDOMEN:  normoactive bowel sounds , non tender, not distended. No palpable hepatosplenomegaly.  Extremity: no pedal edema PSYCH: alert & oriented x 3 with fluent speech NEURO: no focal motor/sensory deficits  LABORATORY DATA:  I have reviewed the data as listed  . CBC Latest Ref Rng & Units 02/08/2020 01/13/2020 01/10/2020  WBC 4.0 - 10.5 K/uL 2.5(L) 1.8(L) 1.3(L)  Hemoglobin 13.0 - 17.0 g/dL 10.8(L) 11.2(L) 10.8(L)  Hematocrit 39 - 52 % 31.5(L) 33.1(L) 32.8(L)  Platelets 150 - 400 K/uL 121(L) 79(L) 99(L)    . CMP Latest Ref Rng & Units 02/08/2020 01/10/2020 12/19/2019  Glucose 70 - 99 mg/dL 141(H) 136(H) 275(H)  BUN 8 - 23 mg/dL _0 Creatinine 0.61 - 1.24 mg/dL 0.88 0.83 0.90  Sodium 135 - 145 mmol/L 143 143 146(H)  Potassium 3.5 - 5.1 mmol/L 4.3 4.0 4.3  Chloride 98 - 111 mmol/L 108 108 105  CO2 22 - 32 mmol/L _1 Calcium 8.9 - 10.3 mg/dL 9.6 9.1 9.4  Total Protein 6.5 - 8.1 g/dL 6.3(L) 6.1(L) 5.9(L)  Total Bilirubin 0.3 - 1.2 mg/dL 0.6 0.7 0.7  Alkaline Phos 38 - 126 U/L 78 67 77  AST 15 - 41 U/L _2 ALT 0 - 44 U/L _3 06/10/2019 Flow Pathology Report ([id number]):   06/10/2019 Surgical Pathology ([id number]):   Surgical Pathology  CASE:  [id number]  PATIENT: Dean Dixon  Flow Pathology Report      Clinical history: Lymphocytosis      DIAGNOSIS:   -Abnormal B-cell population identified.  -See comment    COMMENT:   Analysis of the lymphoid population shows a major abnormal B-cell  population representing 74% of all lymphocytes with expression of  HLA-Dr, CD20 and CD5. This is associated with extremely dim/negative  staining for surface immunoglobulin light chains hindering assessment  and or quantitation of clonality. No CD10, CD38, or CD34 expression is  identified. The findings are most consistent with a B-cell  lymphoproliferative process. Examination of peripheral blood shows  predominance of medium and large atypical lymphoid cells characterized  by coarse chromatin, moderately abundant agranular blue cytoplasm and  occasional cells with small nucleoli. Consideration should be given to  chronic lymphocytic leukemia variant, mantle cell lymphoma, as well as  other CD5 positive B-cell lymphoproliferative processes. FISH studies  may be helpful in this regard.   GATING AND PHENOTYPIC ANALYSIS:   Gated population: Flow cytometric immunophenotyping is performed using  antibodies to the antigens listed in the table below. Electronic gates  are placed around a cell cluster displaying light scatter properties  corresponding to: lymphocytes   Abnormal Cells in gated population: 74%   Phenotype of Abnormal Cells: CD5, CD20, HLA-Dr    04/26/2019 Flow Cytometry [phone]):    05/02/2019 FISH:    WBC Neutros Lymphs  02/05/2017 9.9K 6.0K 2.6K  10/06/2017 12.6K 7.7K 3.2K  11/02/2018 12.3K 5.2K 5.6K    RADIOGRAPHIC STUDIES: I have personally reviewed the radiological images as listed and agreed with the findings in the report. DG Chest 2 View  Result Date: 01/09/2020 CLINICAL DATA:  Pneumonia. EXAM: CHEST - 2 VIEW COMPARISON:  December 09, 2019. FINDINGS: The heart size and mediastinal contours are within normal limits. No pneumothorax or pleural effusion is noted. Right lung is clear. Stable left basilar opacity is noted which is most consistent with subsegmental atelectasis  or scarring. The visualized skeletal structures are unremarkable. IMPRESSION: Stable left basilar subsegmental atelectasis or scarring. Electronically Signed   By: Marijo Conception M.D.   On: 01/09/2020 13:57    ASSESSMENT & PLAN:   69 yo male with   1) Stage IV Mantle cell cell lymphoma CLL FISH shows 17p mutation -04/26/2019 Flow Cytometry [id number]) revealed "Abnormal B-cell population identified. CD5+ clonal lymphoproliferative disorder" -04/26/2019 FISH revealed "Positive for p53 (17p13) deletion." -05/31/2019 PET/CT ([id number]) revealed "1. There are multiple prominent lymph nodes in the neck, both axilla, the retroperitoneum and pelvis with low level hypermetabolic activity consistent with chronic lymphocytic leukemia. Deauville 3 and 4. 2. Mild splenomegaly and mild generalized splenic hypermetabolic activity 3. No evidence of bone marrow involvement." -06/10/2019 Flow Pathology Report ([id number]) revealed "Abnormal B-cell population." -06/10/2019 Surgical Pathology Report ([id number])  revealed "Atypical lymphoid proliferation consistent with mantle cell lymphoma." -09/19/19 PET Skull Base to Thigh ([id number])-- favorable response to current treatment -01/02/2020 PET/CT ([id number]) revealed "1. Continued further decrease in size and FDG accumulation in index cervical, axillary, retroperitoneal, and pelvic lymph nodes. No lymphadenopathy by size criteria today. FDG accumulation in these lymph nodes is compatible with Deauville 2 category. 2. Interval decrease in size with resolution of hypermetabolism associated with the spleen previously."   2) Rigors from Rituxan - needing demerol as premedication. Rituxan rate to be capped at 163m/hour  3) Thrombocytopenia from chemotherapy  4) Neutropenia ? Drug related. ? Idiosyncratic reaction to Rituxan  5) Rituxan related ILD  PLAN: -Discussed  pt labwork today, 02/08/20; Hgb is steady, PLT, WBC, & Neutrophils are improving,  blood chemistries are steady, LDH is stable -No lab or clinical evidence of Mantle Cell Lymphoma progression at this time. Will continue watchful observation. -Recommended that the pt continue to eat well, drink at least 48-64 oz of water each day, and walk 20-30 minutes each day.  -Advised pt that if labs stable at next visit, will continue 3-4 month f/u for the first two years.  -Recommend daily B-complex vitamin and 1 mg Folic acid. -Will see back in 10 weeks with labs    FOLLOW UP: RTC with Dr Irene Limbo with labs in 10 weeks   The total time spent in the appt was 20 minutes and more than 50% was on counseling and direct patient cares.  All of the patient's questions were answered with apparent satisfaction. The patient knows to call the clinic with any problems, questions or concerns.   Sullivan Lone MD [place] AAHIVMS [facility] Hematology/Oncology Physician [facility]  (Office):       [phone] (Work cell):  [phone] (Fax):           [phone]  02/08/2020 9:58 AM  I, Yevette Edwards, am acting as a scribe for Dr. Sullivan Lone.   .I have reviewed the above documentation for accuracy and completeness, and I agree with the above. Brunetta Genera MD

## 2020-02-08 ENCOUNTER — Other Ambulatory Visit: Payer: Self-pay

## 2020-02-08 ENCOUNTER — Inpatient Hospital Stay: Payer: Medicare Other | Attending: Hematology

## 2020-02-08 ENCOUNTER — Inpatient Hospital Stay (HOSPITAL_BASED_OUTPATIENT_CLINIC_OR_DEPARTMENT_OTHER): Payer: Medicare Other | Admitting: Hematology

## 2020-02-08 VITALS — BP 141/71 | HR 73 | Temp 98.3°F | Resp 18 | Ht 64.0 in | Wt 213.0 lb

## 2020-02-08 DIAGNOSIS — C8318 Mantle cell lymphoma, lymph nodes of multiple sites: Secondary | ICD-10-CM | POA: Diagnosis not present

## 2020-02-08 DIAGNOSIS — M129 Arthropathy, unspecified: Secondary | ICD-10-CM | POA: Diagnosis not present

## 2020-02-08 DIAGNOSIS — Z8572 Personal history of non-Hodgkin lymphomas: Secondary | ICD-10-CM | POA: Diagnosis not present

## 2020-02-08 DIAGNOSIS — E119 Type 2 diabetes mellitus without complications: Secondary | ICD-10-CM | POA: Insufficient documentation

## 2020-02-08 DIAGNOSIS — I10 Essential (primary) hypertension: Secondary | ICD-10-CM | POA: Diagnosis not present

## 2020-02-08 DIAGNOSIS — D72819 Decreased white blood cell count, unspecified: Secondary | ICD-10-CM | POA: Diagnosis not present

## 2020-02-08 DIAGNOSIS — D696 Thrombocytopenia, unspecified: Secondary | ICD-10-CM

## 2020-02-08 LAB — CBC WITH DIFFERENTIAL (CANCER CENTER ONLY)
Abs Immature Granulocytes: 0.12 10*3/uL — ABNORMAL HIGH (ref 0.00–0.07)
Basophils Absolute: 0 10*3/uL (ref 0.0–0.1)
Basophils Relative: 1 %
Eosinophils Absolute: 0.2 10*3/uL (ref 0.0–0.5)
Eosinophils Relative: 7 %
HCT: 31.5 % — ABNORMAL LOW (ref 39.0–52.0)
Hemoglobin: 10.8 g/dL — ABNORMAL LOW (ref 13.0–17.0)
Immature Granulocytes: 5 %
Lymphocytes Relative: 19 %
Lymphs Abs: 0.5 10*3/uL — ABNORMAL LOW (ref 0.7–4.0)
MCH: 30.3 pg (ref 26.0–34.0)
MCHC: 34.3 g/dL (ref 30.0–36.0)
MCV: 88.2 fL (ref 80.0–100.0)
Monocytes Absolute: 0.4 10*3/uL (ref 0.1–1.0)
Monocytes Relative: 14 %
Neutro Abs: 1.3 10*3/uL — ABNORMAL LOW (ref 1.7–7.7)
Neutrophils Relative %: 54 %
Platelet Count: 121 10*3/uL — ABNORMAL LOW (ref 150–400)
RBC: 3.57 MIL/uL — ABNORMAL LOW (ref 4.22–5.81)
RDW: 16.7 % — ABNORMAL HIGH (ref 11.5–15.5)
WBC Count: 2.5 10*3/uL — ABNORMAL LOW (ref 4.0–10.5)
nRBC: 0 % (ref 0.0–0.2)

## 2020-02-08 LAB — LACTATE DEHYDROGENASE: LDH: 309 U/L — ABNORMAL HIGH (ref 98–192)

## 2020-02-08 LAB — CMP (CANCER CENTER ONLY)
ALT: 18 U/L (ref 0–44)
AST: 20 U/L (ref 15–41)
Albumin: 3.8 g/dL (ref 3.5–5.0)
Alkaline Phosphatase: 78 U/L (ref 38–126)
Anion gap: 9 (ref 5–15)
BUN: 18 mg/dL (ref 8–23)
CO2: 26 mmol/L (ref 22–32)
Calcium: 9.6 mg/dL (ref 8.9–10.3)
Chloride: 108 mmol/L (ref 98–111)
Creatinine: 0.88 mg/dL (ref 0.61–1.24)
GFR, Est AFR Am: >60 mL/min (ref >60)
GFR, Estimated: >60 mL/min (ref >60)
Glucose, Bld: 141 mg/dL — ABNORMAL HIGH (ref 70–99)
Potassium: 4.3 mmol/L (ref 3.5–5.1)
Sodium: 143 mmol/L (ref 135–145)
Total Bilirubin: 0.6 mg/dL (ref 0.3–1.2)
Total Protein: 6.3 g/dL — ABNORMAL LOW (ref 6.5–8.1)

## 2020-02-08 LAB — SAMPLE TO BLOOD BANK

## 2020-02-09 ENCOUNTER — Telehealth: Payer: Self-pay | Admitting: Hematology

## 2020-02-09 NOTE — Telephone Encounter (Signed)
Scheduled appointment per 8/18 los. Left message for patient to call back with appointment date and time.

## 2020-02-23 DIAGNOSIS — E1169 Type 2 diabetes mellitus with other specified complication: Secondary | ICD-10-CM | POA: Diagnosis not present

## 2020-02-23 DIAGNOSIS — Z1211 Encounter for screening for malignant neoplasm of colon: Secondary | ICD-10-CM | POA: Diagnosis not present

## 2020-02-23 DIAGNOSIS — E782 Mixed hyperlipidemia: Secondary | ICD-10-CM | POA: Diagnosis not present

## 2020-02-23 DIAGNOSIS — C8318 Mantle cell lymphoma, lymph nodes of multiple sites: Secondary | ICD-10-CM | POA: Diagnosis not present

## 2020-02-23 DIAGNOSIS — E785 Hyperlipidemia, unspecified: Secondary | ICD-10-CM | POA: Diagnosis not present

## 2020-02-23 DIAGNOSIS — Z6836 Body mass index (BMI) 36.0-36.9, adult: Secondary | ICD-10-CM | POA: Diagnosis not present

## 2020-02-23 DIAGNOSIS — I10 Essential (primary) hypertension: Secondary | ICD-10-CM | POA: Diagnosis not present

## 2020-02-25 DIAGNOSIS — Z23 Encounter for immunization: Secondary | ICD-10-CM | POA: Diagnosis not present

## 2020-04-02 DIAGNOSIS — Z23 Encounter for immunization: Secondary | ICD-10-CM | POA: Diagnosis not present

## 2020-04-15 NOTE — Progress Notes (Signed)
HEMATOLOGY/ONCOLOGY CLINIC NOTE  Date of Service: 04/15/2020  Patient Care Team: Hamrick, Lorin Mercy, MD as PCP - General (Family Medicine)  CHIEF COMPLAINTS/PURPOSE OF CONSULTATION:  F/u for mantle cell lymphoma  HISTORY OF PRESENTING ILLNESS:   Dean Dixon is a wonderful 69 y.o. male who has been referred to [place] by Dr Daiva Eves for evaluation and management of Lymphocytosis. The pt reports that he is doing well overall.   The pt reports that he has had a high lymphocyte count for several years but it has never been as high as now. 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 This most recent lymphocytosis was picked up by him looking at his own blood in the lab. He has not felt any differently in the last 6 months to 1 year. Pt has arthritis, tendonitis and bursitis in his shoulder. He has not had any concerns of RA and no significantly swollen joints. Pt tore an achilles tendon and then flew on a plane which lead to a blood clot. He has been on a testosterone supplement before but has not used it in a while. Pt has had diverticulitis. He has been losing weight slowly by eating better but has not noticed any unexpected weight loss. Pt does not smoke, does not share needles and has not needed any blood transfusions in the past. Pt denies any respiratory illness or infection symptoms in the last few months. He used to have an elevated IgM level about 30 years ago. He does have several fatty lipomas, a few which he has had removed, but there have been no concerns associated with them. He has recently stopped taking herbal supplements until he can figure out what is going on with his blood counts. He has been taking Glucosamine, Chondroitin & MSM as well as a 25 mg CBD gummy and 10 mg of CBD SL for his joint pain for a few years. He is currently taking Glipizide and Metformin. The Metformin did give the pt diarrhea, which has since resolved.   Most recent lab results (04/12/19) of CBC w/diff and CMP  is as follows: all values are WNL except for Glucose at 191, WBC at 30.5K, Lymphs Abs at 19.6K, Mono Abs at 3.6K, Eos Abs at 0.5K, Baso Abs at 0.3K, Abs Immature Granulocytes at 0.3K. 04/12/2019 PSA at 2.2  On review of systems, pt reports joint pain and denies rashes, fevers, chills, night sweats, unexpected weight loss, issues with swallowing, abdominal pain, bowel movement issues, leg pain, respiratory symptoms, diarrhea and any other symptoms.   On PMHx the pt reports HTN, Diabetes, Arthritis, Diverticulitis. On Social Hx the pt reports that he is a non-smoker.  INTERVAL HISTORY:   Dean Dixon is a wonderful 69 y.o. male who is here for evaluation and management of Mantle Cell Lymphoma. The patient's last visit with [place] was on 02/08/2020. The pt reports that he is doing well overall.  The pt reports progressive abdominal distention and discomfort worse over the last week with significant fatigue and decreased p.o. intake to less than 50%. Patient is also having significant hiccups and reflux but no overt emesis at this time. He complains of diffuse abdominal tenderness without rebound. Decreased bowel movements.  Lab results today (04/15/20) of CBC w/diff and CMP shows hemoglobin is improved to 12.2 though could suggest hemoconcentration.  Platelets have normalized.  CMP shows hypercalcemia of 12.3 which is new.  04/19/2020 LDH at somewhat elevated at 283.  On review of systems, pt  reports significant new fatigue and some shortness of breath.  Unclear if the shortness of breath is just from his distended abdomen.  No fevers no chills no new night sweats. No overt signs of GI bleeding. No abdominal trauma. No change in medications.  MEDICAL HISTORY:  Past Medical History:  Diagnosis Date  . Cancer ([place])    Mantle Cell Lymphoma  . Diabetes mellitus without complication ([place])   . Hypertension     SURGICAL HISTORY: Past Surgical History:  Procedure Laterality Date  .  APPENDECTOMY    . TONSILLECTOMY      SOCIAL HISTORY: Social History   Socioeconomic History  . Marital status: Divorced    Spouse name: Not on file  . Number of children: Not on file  . Years of education: Not on file  . Highest education level: Not on file  Occupational History  . Not on file  Tobacco Use  . Smoking status: Never Smoker  . Smokeless tobacco: Never Used  Substance and Sexual Activity  . Alcohol use: Yes    Alcohol/week: 2.0 standard drinks    Types: 2 Standard drinks or equivalent per week  . Drug use: No  . Sexual activity: Not on file  Other Topics Concern  . Not on file  Social History Narrative  . Not on file   Social Determinants of Health   Financial Resource Strain:   . Difficulty of Paying Living Expenses: Not on file  Food Insecurity:   . Worried About Charity fundraiser in the Last Year: Not on file  . Ran Out of Food in the Last Year: Not on file  Transportation Needs:   . Lack of Transportation (Medical): Not on file  . Lack of Transportation (Non-Medical): Not on file  Physical Activity:   . Days of Exercise per Week: Not on file  . Minutes of Exercise per Session: Not on file  Stress:   . Feeling of Stress : Not on file  Social Connections:   . Frequency of Communication with Friends and Family: Not on file  . Frequency of Social Gatherings with Friends and Family: Not on file  . Attends Religious Services: Not on file  . Active Member of Clubs or Organizations: Not on file  . Attends Archivist Meetings: Not on file  . Marital Status: Not on file  Intimate Partner Violence:   . Fear of Current or Ex-Partner: Not on file  . Emotionally Abused: Not on file  . Physically Abused: Not on file  . Sexually Abused: Not on file    FAMILY HISTORY: No family history on file.  ALLERGIES:  is allergic to ivp dye [iodinated diagnostic agents].  MEDICATIONS:  Current Outpatient Medications  Medication Sig Dispense Refill  .  amLODipine (NORVASC) 10 MG tablet Take 10 mg by mouth daily.    . Ascorbic Acid (VITAMIN C) 500 MG CAPS Take 1 capsule by mouth daily.    . blood glucose meter kit and supplies Check blood sugar 4 times daily (Before breakfast, lunch, dinner, bedtime). (FOR ICD-10 E10.9, E11.9). 1 each 0  . CANNABIDIOL PO Take 30 mg by mouth daily.    . Coenzyme Q10 (COQ-10 PO) Take 1 capsule by mouth daily. Takes CoQ 10 w/cinnamon    . Glucosamine-Chondroitin (GLUCOSAMINE CHONDR COMPLEX PO) Take 1 tablet by mouth daily.    . insulin detemir (LEVEMIR) 100 UNIT/ML FlexPen Inject 10 Units into the skin at bedtime for 5 days. 3 mL 0  .  Insulin Pen Needle 31G X 5 MM MISC BD Pen Needles- brand specific Inject insulin via insulin pen 6 x daily 30 each 0  . LORazepam (ATIVAN) 0.5 MG tablet Take 1 tablet (0.5 mg total) by mouth every 6 (six) hours as needed (Nausea or vomiting). 30 tablet 0  . metFORMIN (GLUCOPHAGE-XR) 500 MG 24 hr tablet Take 500 mg by mouth in the morning and at bedtime.     . Multiple Vitamin (MULTIVITAMIN) tablet Take 1 tablet by mouth daily.    . ondansetron (ZOFRAN) 8 MG tablet Take 1 tablet (8 mg total) by mouth 2 (two) times daily as needed for refractory nausea / vomiting. Start on day 2 after bendamustine chemotherapy. 30 tablet 1  . pantoprazole (PROTONIX) 40 MG tablet Take 1 tablet (40 mg total) by mouth daily at 12 noon for 4 days. 4 tablet 0  . prochlorperazine (COMPAZINE) 10 MG tablet Take 1 tablet (10 mg total) by mouth every 6 (six) hours as needed (Nausea or vomiting). 30 tablet 1  . ramipril (ALTACE) 10 MG capsule Take 10 mg by mouth daily.    Marland Kitchen ZITHROMAX Z-PAK 250 MG tablet Take two tablets today Then take one tablet daily until gone. Check with Oncology as we discussed  prior to starting (Patient not taking: Reported on 02/08/2020) 6 each 0   No current facility-administered medications for this visit.    REVIEW OF SYSTEMS:   A 10+ POINT REVIEW OF SYSTEMS WAS OBTAINED including  neurology, dermatology, psychiatry, cardiac, respiratory, lymph, extremities, GI, GU, Musculoskeletal, constitutional, breasts, reproductive, HEENT.  All pertinent positives are noted in the HPI.  All others are negative.   PHYSICAL EXAMINATION: ECOG PERFORMANCE STATUS:2  . Vitals:   04/19/20 1012  BP: (!) 181/91  Pulse: (!) 108  Resp: 18  Temp: (!) 97.5 F (36.4 C)  SpO2: 95%   Filed Weights   04/19/20 1012  Weight: 210 lb 1.6 oz (95.3 kg)   .Body mass index is 36.06 kg/m.   Mild distress due to abdominal distention and discomfort GENERAL:alert SKIN: no acute rashes, no significant lesions EYES: conjunctiva are pink and non-injected, sclera anicteric OROPHARYNX: MMM, no exudates, no oropharyngeal erythema or ulceration NECK: supple, no JVD LYMPH:  no palpable lymphadenopathy in the cervical, axillary or inguinal regions LUNGS: clear to auscultation b/l with normal respiratory effort HEART: S1-S2 tachycardia ABDOMEN: Significant abdominal distention and tense with nonfocal tenderness to palpation and no rigidity or rebound.  Extremity: no pedal edema PSYCH: alert & oriented x 3 with fluent speech NEURO: no focal motor/sensory deficits  LABORATORY DATA:  I have reviewed the data as listed  . CBC Latest Ref Rng & Units 04/19/2020 02/08/2020 01/13/2020  WBC 4.0 - 10.5 K/uL 11.3(H) 2.5(L) 1.8(L)  Hemoglobin 13.0 - 17.0 g/dL 12.2(L) 10.8(L) 11.2(L)  Hematocrit 39 - 52 % 37.3(L) 31.5(L) 33.1(L)  Platelets 150 - 400 K/uL 197 121(L) 79(L)    . CMP Latest Ref Rng & Units 04/19/2020 02/08/2020 01/10/2020  Glucose 70 - 99 mg/dL 169(H) 141(H) 136(H)  BUN 8 - 23 mg/dL _0 Creatinine 0.61 - 1.24 mg/dL 1.13 0.88 0.83  Sodium 135 - 145 mmol/L 144 143 143  Potassium 3.5 - 5.1 mmol/L 3.2(L) 4.3 4.0  Chloride 98 - 111 mmol/L 102 108 108  CO2 22 - 32 mmol/L 32 26 28  Calcium 8.9 - 10.3 mg/dL 12.3(H) 9.6 9.1  Total Protein 6.5 - 8.1 g/dL 6.5 6.3(L) 6.1(L)  Total Bilirubin 0.3 -  1.2  mg/dL 0.7 0.6 0.7  Alkaline Phos 38 - 126 U/L 89 78 67  AST 15 - 41 U/L _0 ALT 0 - 44 U/L _1 06/10/2019 Flow Pathology Report ([id number]):   06/10/2019 Surgical Pathology ([id number]):   Surgical Pathology  CASE: [id number]  PATIENT: Dean Dixon  Flow Pathology Report      Clinical history: Lymphocytosis      DIAGNOSIS:   -Abnormal B-cell population identified.  -See comment   COMMENT:   Analysis of the lymphoid population shows a major abnormal B-cell  population representing 74% of all lymphocytes with expression of  HLA-Dr, CD20 and CD5. This is associated with extremely dim/negative  staining for surface immunoglobulin light chains hindering assessment  and or quantitation of clonality. No CD10, CD38, or CD34 expression is  identified. The findings are most consistent with a B-cell  lymphoproliferative process. Examination of peripheral blood shows  predominance of medium and large atypical lymphoid cells characterized  by coarse chromatin, moderately abundant agranular blue cytoplasm and  occasional cells with small nucleoli. Consideration should be given to  chronic lymphocytic leukemia variant, mantle cell lymphoma, as well as  other CD5 positive B-cell lymphoproliferative processes. FISH studies  may be helpful in this regard.   GATING AND PHENOTYPIC ANALYSIS:   Gated population: Flow cytometric immunophenotyping is performed using  antibodies to the antigens listed in the table below. Electronic gates  are placed around a cell cluster displaying light scatter properties  corresponding to: lymphocytes   Abnormal Cells in gated population: 74%   Phenotype of Abnormal Cells: CD5, CD20, HLA-Dr    04/26/2019 Flow Cytometry [id number]):    05/02/2019 FISH:    WBC Neutros Lymphs  02/05/2017 9.9K 6.0K 2.6K  10/06/2017 12.6K 7.7K 3.2K  11/02/2018 12.3K 5.2K 5.6K    RADIOGRAPHIC STUDIES: I have personally  reviewed the radiological images as listed and agreed with the findings in the report. No results found.  ASSESSMENT & PLAN:   69 yo male with   1) Stage IV Mantle cell cell lymphoma CLL FISH shows 17p mutation -04/26/2019 Flow Cytometry [phone]) revealed "Abnormal B-cell population identified. CD5+ clonal lymphoproliferative disorder" -04/26/2019 FISH revealed "Positive for p53 (17p13) deletion." -05/31/2019 PET/CT ([id number]) revealed "1. There are multiple prominent lymph nodes in the neck, both axilla, the retroperitoneum and pelvis with low level hypermetabolic activity consistent with chronic lymphocytic leukemia. Deauville 3 and 4. 2. Mild splenomegaly and mild generalized splenic hypermetabolic activity 3. No evidence of bone marrow involvement." -06/10/2019 Flow Pathology Report ([id number]) revealed "Abnormal B-cell population." -06/10/2019 Surgical Pathology Report ([id number])  revealed "Atypical lymphoid proliferation consistent with mantle cell lymphoma." -09/19/19 PET Skull Base to Thigh ([id number])-- favorable response to current treatment -01/02/2020 PET/CT ([id number]) revealed "1. Continued further decrease in size and FDG accumulation in index cervical, axillary, retroperitoneal, and pelvic lymph nodes. No lymphadenopathy by size criteria today. FDG accumulation in these lymph nodes is compatible with Deauville 2 category. 2. Interval decrease in size with resolution of hypermetabolism associated with the spleen previously."   2) Rigors from Rituxan - needing demerol as premedication. Rituxan rate to be capped at 134m/hour  3) Thrombocytopenia from chemotherapy  4) Neutropenia ? Drug related. ? Idiosyncratic reaction to Rituxan  5) Rituxan related ILD  6) significant new abdominal distention concerning for acute abdomen ?  Bowel obstruction ?  Lymphoma recurrence.  Other acute intra-abdominal issues.  7) new hypercalcemia- ?  Related to dehydration, ?   Lymphoma recurrence or other etiologies.  PLAN: -Discussed with patient the labs and his clinical symptomatology in details. -Discussed with him the concerns for his acute abdominal change with possible etiologies of complete/partial small bowel obstruction or recurrence of his mantle cell lymphoma in his abdomen or other possible causes of acute abdomen. -Discussed his hypercalcemia and possible etiologies. -Patient will need to have an emergent evaluation for his rapidly progressive symptoms which are making him quite uncomfortable at this time. -Would recommend stat CT chest abdomen pelvis. -IV fluids -Hypercalcemia work-up -with PTH, PTH RP, myeloma panel, 125 hydroxy vitamin D, 25-hydroxy vitamin D, lymphoma work-up with CT chest abdomen pelvis. -If significant bowel distention on CT abdomen might need NG decompression. -Admit to hospital medicine.  Oncology will continue to follow as consultation.   FOLLOW UP: Based on evaluation in the hospital for his acute symptoms   The total time spent in the appt was 30 minutes and more than 50% was on counseling and direct patient cares, coordination of care with ED.  All of the patient's questions were answered with apparent satisfaction. The patient knows to call the clinic with any problems, questions or concerns.   Sullivan Lone MD [place] AAHIVMS [facility] Hematology/Oncology Physician [facility]  (Office):       [phone] (Work cell):  [phone] (Fax):           [phone]  04/15/2020 8:43 PM  I, Yevette Edwards, am acting as a scribe for Dr. Sullivan Lone.   .I have reviewed the above documentation for accuracy and completeness, and I agree with the above. Brunetta Genera MD

## 2020-04-17 ENCOUNTER — Telehealth: Payer: Self-pay | Admitting: Hematology

## 2020-04-17 NOTE — Telephone Encounter (Signed)
Called patient to rescheduled appointments per 10/22 sch msg. No answer. Left message for patient to call back to reschedule.

## 2020-04-18 ENCOUNTER — Other Ambulatory Visit: Payer: Medicare Other

## 2020-04-19 ENCOUNTER — Inpatient Hospital Stay (HOSPITAL_BASED_OUTPATIENT_CLINIC_OR_DEPARTMENT_OTHER): Payer: Medicare Other | Admitting: Hematology

## 2020-04-19 ENCOUNTER — Inpatient Hospital Stay (HOSPITAL_COMMUNITY): Payer: Medicare Other

## 2020-04-19 ENCOUNTER — Inpatient Hospital Stay: Payer: Medicare Other

## 2020-04-19 ENCOUNTER — Other Ambulatory Visit: Payer: Self-pay

## 2020-04-19 ENCOUNTER — Emergency Department (HOSPITAL_COMMUNITY): Payer: Medicare Other

## 2020-04-19 ENCOUNTER — Encounter (HOSPITAL_COMMUNITY): Payer: Self-pay | Admitting: Emergency Medicine

## 2020-04-19 ENCOUNTER — Inpatient Hospital Stay (HOSPITAL_COMMUNITY)
Admission: EM | Admit: 2020-04-19 | Discharge: 2020-04-23 | DRG: 840 | Disposition: A | Discharge status: Home / Self Care | Payer: Medicare Other | Source: Ambulatory Visit | Attending: Internal Medicine | Admitting: Internal Medicine

## 2020-04-19 ENCOUNTER — Inpatient Hospital Stay: Payer: Self-pay

## 2020-04-19 VITALS — BP 181/91 | HR 108 | Temp 97.5°F | Resp 18 | Ht 64.0 in | Wt 210.1 lb

## 2020-04-19 DIAGNOSIS — I2510 Atherosclerotic heart disease of native coronary artery without angina pectoris: Secondary | ICD-10-CM | POA: Diagnosis not present

## 2020-04-19 DIAGNOSIS — C9110 Chronic lymphocytic leukemia of B-cell type not having achieved remission: Secondary | ICD-10-CM | POA: Diagnosis present

## 2020-04-19 DIAGNOSIS — D6959 Other secondary thrombocytopenia: Secondary | ICD-10-CM | POA: Insufficient documentation

## 2020-04-19 DIAGNOSIS — J189 Pneumonia, unspecified organism: Secondary | ICD-10-CM

## 2020-04-19 DIAGNOSIS — E870 Hyperosmolality and hypernatremia: Secondary | ICD-10-CM | POA: Diagnosis present

## 2020-04-19 DIAGNOSIS — I5033 Acute on chronic diastolic (congestive) heart failure: Secondary | ICD-10-CM | POA: Diagnosis present

## 2020-04-19 DIAGNOSIS — K5669 Other partial intestinal obstruction: Secondary | ICD-10-CM | POA: Diagnosis present

## 2020-04-19 DIAGNOSIS — Z20822 Contact with and (suspected) exposure to covid-19: Secondary | ICD-10-CM | POA: Diagnosis present

## 2020-04-19 DIAGNOSIS — R188 Other ascites: Secondary | ICD-10-CM | POA: Diagnosis present

## 2020-04-19 DIAGNOSIS — R100 Acute abdomen: Secondary | ICD-10-CM

## 2020-04-19 DIAGNOSIS — C786 Secondary malignant neoplasm of retroperitoneum and peritoneum: Secondary | ICD-10-CM | POA: Diagnosis present

## 2020-04-19 DIAGNOSIS — E119 Type 2 diabetes mellitus without complications: Secondary | ICD-10-CM

## 2020-04-19 DIAGNOSIS — R590 Localized enlarged lymph nodes: Secondary | ICD-10-CM | POA: Diagnosis present

## 2020-04-19 DIAGNOSIS — I10 Essential (primary) hypertension: Secondary | ICD-10-CM | POA: Diagnosis present

## 2020-04-19 DIAGNOSIS — Z8701 Personal history of pneumonia (recurrent): Secondary | ICD-10-CM | POA: Diagnosis not present

## 2020-04-19 DIAGNOSIS — D696 Thrombocytopenia, unspecified: Secondary | ICD-10-CM

## 2020-04-19 DIAGNOSIS — J9621 Acute and chronic respiratory failure with hypoxia: Secondary | ICD-10-CM | POA: Diagnosis present

## 2020-04-19 DIAGNOSIS — Z0189 Encounter for other specified special examinations: Secondary | ICD-10-CM | POA: Diagnosis not present

## 2020-04-19 DIAGNOSIS — R5383 Other fatigue: Secondary | ICD-10-CM | POA: Insufficient documentation

## 2020-04-19 DIAGNOSIS — J849 Interstitial pulmonary disease, unspecified: Secondary | ICD-10-CM | POA: Diagnosis present

## 2020-04-19 DIAGNOSIS — J9601 Acute respiratory failure with hypoxia: Secondary | ICD-10-CM | POA: Diagnosis present

## 2020-04-19 DIAGNOSIS — Z79899 Other long term (current) drug therapy: Secondary | ICD-10-CM | POA: Diagnosis not present

## 2020-04-19 DIAGNOSIS — T451X5A Adverse effect of antineoplastic and immunosuppressive drugs, initial encounter: Secondary | ICD-10-CM | POA: Diagnosis present

## 2020-04-19 DIAGNOSIS — Z794 Long term (current) use of insulin: Secondary | ICD-10-CM | POA: Diagnosis not present

## 2020-04-19 DIAGNOSIS — E860 Dehydration: Secondary | ICD-10-CM | POA: Diagnosis present

## 2020-04-19 DIAGNOSIS — C8318 Mantle cell lymphoma, lymph nodes of multiple sites: Secondary | ICD-10-CM | POA: Diagnosis present

## 2020-04-19 DIAGNOSIS — I5031 Acute diastolic (congestive) heart failure: Secondary | ICD-10-CM

## 2020-04-19 DIAGNOSIS — R059 Cough, unspecified: Secondary | ICD-10-CM | POA: Diagnosis not present

## 2020-04-19 DIAGNOSIS — Z9221 Personal history of antineoplastic chemotherapy: Secondary | ICD-10-CM | POA: Insufficient documentation

## 2020-04-19 DIAGNOSIS — E876 Hypokalemia: Secondary | ICD-10-CM | POA: Diagnosis present

## 2020-04-19 DIAGNOSIS — C8310 Mantle cell lymphoma, unspecified site: Secondary | ICD-10-CM | POA: Diagnosis not present

## 2020-04-19 DIAGNOSIS — Z5111 Encounter for antineoplastic chemotherapy: Secondary | ICD-10-CM | POA: Diagnosis not present

## 2020-04-19 DIAGNOSIS — I110 Hypertensive heart disease with heart failure: Secondary | ICD-10-CM | POA: Diagnosis present

## 2020-04-19 DIAGNOSIS — R000 Tachycardia, unspecified: Secondary | ICD-10-CM | POA: Diagnosis present

## 2020-04-19 DIAGNOSIS — Z8572 Personal history of non-Hodgkin lymphomas: Secondary | ICD-10-CM | POA: Insufficient documentation

## 2020-04-19 DIAGNOSIS — D702 Other drug-induced agranulocytosis: Secondary | ICD-10-CM

## 2020-04-19 DIAGNOSIS — D709 Neutropenia, unspecified: Secondary | ICD-10-CM | POA: Diagnosis present

## 2020-04-19 DIAGNOSIS — R066 Hiccough: Secondary | ICD-10-CM | POA: Insufficient documentation

## 2020-04-19 DIAGNOSIS — Z7189 Other specified counseling: Secondary | ICD-10-CM

## 2020-04-19 DIAGNOSIS — R197 Diarrhea, unspecified: Secondary | ICD-10-CM | POA: Diagnosis not present

## 2020-04-19 DIAGNOSIS — J9811 Atelectasis: Secondary | ICD-10-CM | POA: Diagnosis present

## 2020-04-19 DIAGNOSIS — K219 Gastro-esophageal reflux disease without esophagitis: Secondary | ICD-10-CM | POA: Insufficient documentation

## 2020-04-19 DIAGNOSIS — J90 Pleural effusion, not elsewhere classified: Secondary | ICD-10-CM | POA: Diagnosis not present

## 2020-04-19 DIAGNOSIS — A419 Sepsis, unspecified organism: Secondary | ICD-10-CM | POA: Diagnosis not present

## 2020-04-19 DIAGNOSIS — Z91041 Radiographic dye allergy status: Secondary | ICD-10-CM

## 2020-04-19 DIAGNOSIS — C8313 Mantle cell lymphoma, intra-abdominal lymph nodes: Principal | ICD-10-CM | POA: Diagnosis present

## 2020-04-19 DIAGNOSIS — R140 Abdominal distension (gaseous): Secondary | ICD-10-CM | POA: Insufficient documentation

## 2020-04-19 DIAGNOSIS — R1900 Intra-abdominal and pelvic swelling, mass and lump, unspecified site: Principal | ICD-10-CM

## 2020-04-19 DIAGNOSIS — D72819 Decreased white blood cell count, unspecified: Secondary | ICD-10-CM

## 2020-04-19 LAB — RESPIRATORY PANEL BY RT PCR (FLU A&B, COVID)
Influenza A by PCR: NEGATIVE
Influenza B by PCR: NEGATIVE
SARS Coronavirus 2 by RT PCR: NEGATIVE

## 2020-04-19 LAB — CMP (CANCER CENTER ONLY)
ALT: 10 U/L (ref 0–44)
AST: 16 U/L (ref 15–41)
Albumin: 4 g/dL (ref 3.5–5.0)
Alkaline Phosphatase: 89 U/L (ref 38–126)
Anion gap: 10 (ref 5–15)
BUN: 9 mg/dL (ref 8–23)
CO2: 32 mmol/L (ref 22–32)
Calcium: 12.3 mg/dL — ABNORMAL HIGH (ref 8.9–10.3)
Chloride: 102 mmol/L (ref 98–111)
Creatinine: 1.13 mg/dL (ref 0.61–1.24)
GFR, Estimated: >60 mL/min (ref >60)
Glucose, Bld: 169 mg/dL — ABNORMAL HIGH (ref 70–99)
Potassium: 3.2 mmol/L — ABNORMAL LOW (ref 3.5–5.1)
Sodium: 144 mmol/L (ref 135–145)
Total Bilirubin: 0.7 mg/dL (ref 0.3–1.2)
Total Protein: 6.5 g/dL (ref 6.5–8.1)

## 2020-04-19 LAB — COMPREHENSIVE METABOLIC PANEL
ALT: 12 U/L (ref 0–44)
ALT: 12 U/L (ref 0–44)
AST: 19 U/L (ref 15–41)
AST: 16 U/L (ref 15–41)
Albumin: 4.4 g/dL (ref 3.5–5.0)
Albumin: 4 g/dL (ref 3.5–5.0)
Alkaline Phosphatase: 82 U/L (ref 38–126)
Alkaline Phosphatase: 72 U/L (ref 38–126)
Anion gap: 14 (ref 5–15)
Anion gap: 18 — ABNORMAL HIGH (ref 5–15)
BUN: 11 mg/dL (ref 8–23)
BUN: 12 mg/dL (ref 8–23)
CO2: 30 mmol/L (ref 22–32)
CO2: 26 mmol/L (ref 22–32)
Calcium: 12.4 mg/dL — ABNORMAL HIGH (ref 8.9–10.3)
Calcium: 12.4 mg/dL — ABNORMAL HIGH (ref 8.9–10.3)
Chloride: 102 mmol/L (ref 98–111)
Chloride: 102 mmol/L (ref 98–111)
Creatinine, Ser: 1.05 mg/dL (ref 0.61–1.24)
Creatinine, Ser: 1.02 mg/dL (ref 0.61–1.24)
GFR, Estimated: >60 mL/min (ref >60)
GFR, Estimated: >60 mL/min (ref >60)
Glucose, Bld: 186 mg/dL — ABNORMAL HIGH (ref 70–99)
Glucose, Bld: 218 mg/dL — ABNORMAL HIGH (ref 70–99)
Potassium: 3.2 mmol/L — ABNORMAL LOW (ref 3.5–5.1)
Potassium: 3.1 mmol/L — ABNORMAL LOW (ref 3.5–5.1)
Sodium: 146 mmol/L — ABNORMAL HIGH (ref 135–145)
Sodium: 146 mmol/L — ABNORMAL HIGH (ref 135–145)
Total Bilirubin: 0.9 mg/dL (ref 0.3–1.2)
Total Bilirubin: 1 mg/dL (ref 0.3–1.2)
Total Protein: 7.2 g/dL (ref 6.5–8.1)
Total Protein: 6.5 g/dL (ref 6.5–8.1)

## 2020-04-19 LAB — CBC WITH DIFFERENTIAL/PLATELET
Abs Immature Granulocytes: 0.23 10*3/uL — ABNORMAL HIGH (ref 0.00–0.07)
Abs Immature Granulocytes: 0.15 10*3/uL — ABNORMAL HIGH (ref 0.00–0.07)
Basophils Absolute: 0.1 10*3/uL (ref 0.0–0.1)
Basophils Absolute: 0.1 10*3/uL (ref 0.0–0.1)
Basophils Relative: 1 %
Basophils Relative: 0 %
Eosinophils Absolute: 0.2 10*3/uL (ref 0.0–0.5)
Eosinophils Absolute: 0.1 10*3/uL (ref 0.0–0.5)
Eosinophils Relative: 2 %
Eosinophils Relative: 0 %
HCT: 37.3 % — ABNORMAL LOW (ref 39.0–52.0)
HCT: 37 % — ABNORMAL LOW (ref 39.0–52.0)
Hemoglobin: 12.2 g/dL — ABNORMAL LOW (ref 13.0–17.0)
Hemoglobin: 11.9 g/dL — ABNORMAL LOW (ref 13.0–17.0)
Immature Granulocytes: 2 %
Immature Granulocytes: 1 %
Lymphocytes Relative: 30 %
Lymphocytes Relative: 10 %
Lymphs Abs: 3.4 10*3/uL (ref 0.7–4.0)
Lymphs Abs: 1.4 10*3/uL (ref 0.7–4.0)
MCH: 28 pg (ref 26.0–34.0)
MCH: 28.1 pg (ref 26.0–34.0)
MCHC: 32.7 g/dL (ref 30.0–36.0)
MCHC: 32.2 g/dL (ref 30.0–36.0)
MCV: 85.7 fL (ref 80.0–100.0)
MCV: 87.3 fL (ref 80.0–100.0)
Monocytes Absolute: 1 10*3/uL (ref 0.1–1.0)
Monocytes Absolute: 0.7 10*3/uL (ref 0.1–1.0)
Monocytes Relative: 9 %
Monocytes Relative: 5 %
Neutro Abs: 6.4 10*3/uL (ref 1.7–7.7)
Neutro Abs: 11.1 10*3/uL — ABNORMAL HIGH (ref 1.7–7.7)
Neutrophils Relative %: 56 %
Neutrophils Relative %: 84 %
Platelets: 197 10*3/uL (ref 150–400)
Platelets: 203 10*3/uL (ref 150–400)
RBC: 4.35 MIL/uL (ref 4.22–5.81)
RBC: 4.24 MIL/uL (ref 4.22–5.81)
RDW: 17.2 % — ABNORMAL HIGH (ref 11.5–15.5)
RDW: 17.3 % — ABNORMAL HIGH (ref 11.5–15.5)
WBC: 11.3 10*3/uL — ABNORMAL HIGH (ref 4.0–10.5)
WBC: 13.4 10*3/uL — ABNORMAL HIGH (ref 4.0–10.5)
nRBC: 0.2 % (ref 0.0–0.2)
nRBC: 0.1 % (ref 0.0–0.2)

## 2020-04-19 LAB — LACTIC ACID, PLASMA
Lactic Acid, Venous: 2.3 mmol/L (ref 0.5–1.9)
Lactic Acid, Venous: 2.7 mmol/L (ref 0.5–1.9)
Lactic Acid, Venous: 1.9 mmol/L (ref 0.5–1.9)
Lactic Acid, Venous: 1.6 mmol/L (ref 0.5–1.9)

## 2020-04-19 LAB — MAGNESIUM: Magnesium: 2.2 mg/dL (ref 1.7–2.4)

## 2020-04-19 LAB — LACTATE DEHYDROGENASE: LDH: 284 U/L — ABNORMAL HIGH (ref 98–192)

## 2020-04-19 LAB — GLUCOSE, CAPILLARY
Glucose-Capillary: 207 mg/dL — ABNORMAL HIGH (ref 70–99)
Glucose-Capillary: 187 mg/dL — ABNORMAL HIGH (ref 70–99)

## 2020-04-19 LAB — BRAIN NATRIURETIC PEPTIDE: B Natriuretic Peptide: 122.2 pg/mL — ABNORMAL HIGH (ref 0.0–100.0)

## 2020-04-19 MED ORDER — ACETAMINOPHEN 325 MG PO TABS: 650.0000 mg | ORAL_TABLET | Freq: Four times a day (QID) | ORAL | Status: DC | PRN

## 2020-04-19 MED ORDER — IPRATROPIUM-ALBUTEROL 0.5-2.5 (3) MG/3ML IN SOLN
3.0000 mL | Freq: Once | RESPIRATORY_TRACT | Status: AC
  Administered 2020-04-19: 3 mL via RESPIRATORY_TRACT
  Filled 2020-04-19: qty 3

## 2020-04-19 MED ORDER — PANTOPRAZOLE SODIUM 40 MG PO TBEC
40.0000 mg | DELAYED_RELEASE_TABLET | Freq: Every day | ORAL | Status: DC
  Administered 2020-04-19 – 2020-04-23 (×5): 40 mg via ORAL
  Filled 2020-04-19 (×5): qty 1

## 2020-04-19 MED ORDER — ENOXAPARIN SODIUM 40 MG/0.4ML ~~LOC~~ SOLN
40.0000 mg | SUBCUTANEOUS | Status: DC
  Administered 2020-04-19 – 2020-04-22 (×4): 40 mg via SUBCUTANEOUS
  Filled 2020-04-19 (×4): qty 0.4

## 2020-04-19 MED ORDER — FAMOTIDINE IN NACL 20-0.9 MG/50ML-% IV SOLN
20.0000 mg | Freq: Once | INTRAVENOUS | Status: AC
  Administered 2020-04-19: 20 mg via INTRAVENOUS
  Filled 2020-04-19: qty 50

## 2020-04-19 MED ORDER — ACETAMINOPHEN 650 MG RE SUPP: 650.0000 mg | Freq: Four times a day (QID) | RECTAL | Status: DC | PRN

## 2020-04-19 MED ORDER — GUAIFENESIN-DM 100-10 MG/5ML PO SYRP: 5.0000 mL | ORAL_SOLUTION | ORAL | Status: DC | PRN

## 2020-04-19 MED ORDER — INSULIN ASPART 100 UNIT/ML ~~LOC~~ SOLN
0.0000 [IU] | Freq: Three times a day (TID) | SUBCUTANEOUS | Status: DC
  Administered 2020-04-19: 3 [IU] via SUBCUTANEOUS
  Administered 2020-04-20 – 2020-04-21 (×3): 2 [IU] via SUBCUTANEOUS
  Administered 2020-04-21: 3 [IU] via SUBCUTANEOUS
  Administered 2020-04-21: 1 [IU] via SUBCUTANEOUS
  Administered 2020-04-22: 3 [IU] via SUBCUTANEOUS
  Administered 2020-04-22 (×2): 2 [IU] via SUBCUTANEOUS
  Administered 2020-04-23: 1 [IU] via SUBCUTANEOUS

## 2020-04-19 MED ORDER — POTASSIUM CHLORIDE CRYS ER 20 MEQ PO TBCR
20.0000 meq | EXTENDED_RELEASE_TABLET | Freq: Once | ORAL | Status: AC
  Administered 2020-04-19: 20 meq via ORAL
  Filled 2020-04-19: qty 1

## 2020-04-19 MED ORDER — MORPHINE SULFATE (PF) 2 MG/ML IV SOLN: 2.0000 mg | INTRAVENOUS | Status: DC | PRN

## 2020-04-19 MED ORDER — SODIUM CHLORIDE 0.9 % IV SOLN
500.0000 mg | INTRAVENOUS | Status: DC
  Administered 2020-04-19 – 2020-04-21 (×3): 500 mg via INTRAVENOUS
  Filled 2020-04-19 (×3): qty 500

## 2020-04-19 MED ORDER — SODIUM CHLORIDE 0.9% FLUSH
3.0000 mL | Freq: Two times a day (BID) | INTRAVENOUS | Status: DC
  Administered 2020-04-19 – 2020-04-23 (×6): 3 mL via INTRAVENOUS

## 2020-04-19 MED ORDER — FUROSEMIDE 10 MG/ML IJ SOLN
20.0000 mg | Freq: Once | INTRAMUSCULAR | Status: AC
  Administered 2020-04-19: 20 mg via INTRAVENOUS
  Filled 2020-04-19: qty 4

## 2020-04-19 MED ORDER — ALLOPURINOL 300 MG PO TABS
300.0000 mg | ORAL_TABLET | Freq: Every day | ORAL | Status: DC
  Administered 2020-04-19 – 2020-04-23 (×5): 300 mg via ORAL
  Filled 2020-04-19 (×5): qty 1

## 2020-04-19 MED ORDER — PROCHLORPERAZINE MALEATE 10 MG PO TABS: 10.0000 mg | ORAL_TABLET | Freq: Four times a day (QID) | ORAL | Status: DC | PRN

## 2020-04-19 MED ORDER — SODIUM CHLORIDE 0.9 % IV SOLN
2.0000 g | INTRAVENOUS | Status: DC
  Administered 2020-04-19 – 2020-04-21 (×3): 2 g via INTRAVENOUS
  Filled 2020-04-19: qty 20
  Filled 2020-04-19: qty 2
  Filled 2020-04-19: qty 20

## 2020-04-19 MED ORDER — LACTATED RINGERS IV BOLUS
1000.0000 mL | Freq: Once | INTRAVENOUS | Status: AC
  Administered 2020-04-19: 1000 mL via INTRAVENOUS

## 2020-04-19 MED ORDER — AMLODIPINE BESYLATE 10 MG PO TABS
10.0000 mg | ORAL_TABLET | Freq: Every day | ORAL | Status: DC
  Administered 2020-04-19 – 2020-04-23 (×5): 10 mg via ORAL
  Filled 2020-04-19 (×5): qty 1

## 2020-04-19 MED ORDER — HYDROCODONE-ACETAMINOPHEN 5-325 MG PO TABS: 1.0000 | ORAL_TABLET | ORAL | Status: DC | PRN

## 2020-04-19 MED ORDER — RAMIPRIL 10 MG PO CAPS
10.0000 mg | ORAL_CAPSULE | Freq: Every day | ORAL | Status: DC
  Administered 2020-04-19 – 2020-04-23 (×5): 10 mg via ORAL
  Filled 2020-04-19 (×8): qty 1

## 2020-04-19 MED ORDER — LIDOCAINE HCL 1 % IJ SOLN
INTRAMUSCULAR | Status: AC
  Filled 2020-04-19: qty 20

## 2020-04-19 MED ORDER — PREDNISONE 20 MG PO TABS
80.0000 mg | ORAL_TABLET | Freq: Every day | ORAL | Status: DC
  Administered 2020-04-19 – 2020-04-23 (×5): 80 mg via ORAL
  Filled 2020-04-19 (×2): qty 4
  Filled 2020-04-19: qty 1
  Filled 2020-04-19: qty 4
  Filled 2020-04-19: qty 1

## 2020-04-19 MED ORDER — ALBUTEROL SULFATE HFA 108 (90 BASE) MCG/ACT IN AERS
2.0000 | INHALATION_SPRAY | Freq: Once | RESPIRATORY_TRACT | Status: AC
  Administered 2020-04-19: 2 via RESPIRATORY_TRACT
  Filled 2020-04-19: qty 6.7

## 2020-04-19 MED ORDER — METOPROLOL TARTRATE 5 MG/5ML IV SOLN
5.0000 mg | Freq: Four times a day (QID) | INTRAVENOUS | Status: DC | PRN
  Administered 2020-04-19: 5 mg via INTRAVENOUS
  Filled 2020-04-19: qty 5

## 2020-04-19 NOTE — Progress Notes (Signed)
Per Dr. Grier Mitts directions - transported patient to [facility] ED, room 16 by wheelchair. Patient AOx3. Patient assisted from wheelchair to stretcher in room. Report given to [place], [place].

## 2020-04-19 NOTE — ED Triage Notes (Addendum)
Arrives via cancer center, they brought him over D/T tachycardia and abdominal distention. Pt is wheezing and coughing upon presentation, 89% RA. 2 L [place] applied, O2 at 95%. Patient complains of abdominal pain, mild, 'on the edges' of his abdomen. Fully vaccinated against COVID-19.
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
## 2020-04-19 NOTE — Progress Notes (Signed)
HEMATOLOGY-ONCOLOGY PROGRESS NOTE  SUBJECTIVE: The patient was seen at the cancer this morning and sent to the emergency room for urgent evaluation of his hypercalcemia.  CT abdomen pelvis showed a 15 cm lymphoid mass in the left upper quadrant concerning for mass with mesenteric adenopathy versus lymphoma of the small bowel or descending colon, new periaortic retroperitoneal lymphadenopathy, new precordial adenopathy, omental thickening in the left lower quadrant, no bowel obstruction or bowel perforation.  The patient reports that he feels fairly distended.  Having issues with constipation.  No nausea or vomiting reported today.  Appetite diminished.  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 ([place])  06/20/2019 Initial Diagnosis   Mantle cell lymphoma of lymph nodes of multiple regions ([place])   06/29/2019 -  Chemotherapy   The patient had dexamethasone (DECADRON) 4 MG tablet, 8 mg, Oral, Daily, 1 of 1 cycle, Start date: 06/20/2019, End date: 11/17/2019 palonosetron (ALOXI) injection 0.25 mg, 0.25 mg, Intravenous,  Once, 5 of 5 cycles Administration: 0.25 mg (07/27/2019), 0.25 mg (06/30/2019), 0.25 mg (08/24/2019), 0.25 mg (10/20/2019), 0.25 mg (09/21/2019) pegfilgrastim (NEULASTA ONPRO KIT) injection 6 mg, 6 mg, Subcutaneous, Once, 3 of 3 cycles Administration: 6 mg (08/25/2019), 6 mg (09/22/2019) bendamustine (BENDEKA) 200 mg in sodium chloride 0.9 % 50 mL (3.4483 mg/mL) chemo infusion, 90 mg/m2 = 200 mg, Intravenous,  Once, 5 of 5 cycles Administration: 200 mg (06/29/2019), 200 mg (06/30/2019), 200 mg (07/27/2019), 200 mg (07/28/2019), 200 mg (08/24/2019), 200 mg (08/25/2019), 200 mg (09/21/2019), 200 mg (09/22/2019) riTUXimab-pvvr (RUXIENCE) 800 mg in sodium chloride 0.9 % 250 mL (2.4242 mg/mL) infusion, 375 mg/m2 = 800 mg, Intravenous,  Once, 5 of 5 cycles Dose modification: 375 mg/m2 (original dose 375 mg/m2, Cycle 2), 400 mg (original dose 375 mg/m2, Cycle 2, Reason: Other (see comments),  Comment: finishing rituximab from previous day, infusion not completed), 300 mg (original dose 300 mg, Cycle 4) Administration: 800 mg (06/29/2019), 800 mg (07/27/2019), 800 mg (08/24/2019), 400 mg (07/28/2019), 800 mg (10/20/2019), 800 mg (09/21/2019), 300 mg (09/22/2019)  for chemotherapy treatment.       REVIEW OF SYSTEMS:   Constitutional: Denies fevers, chills Eyes: Denies blurriness of vision Ears, nose, mouth, throat, and face: Denies mucositis or sore throat Respiratory: Denies cough, dyspnea or wheezes Cardiovascular: Denies palpitation, chest discomfort Gastrointestinal: Reports abdominal distention and constipation, no nausea or vomiting Skin: Denies abnormal skin rashes Neurological:Denies numbness, tingling or new weaknesses Behavioral/Psych: Mood is stable, no new changes  Extremities: No lower extremity edema All other systems were reviewed with the patient and are negative.  I have reviewed the past medical history, past surgical history, social history and family history with the patient and they are unchanged from previous note.   PHYSICAL EXAMINATION: ECOG PERFORMANCE STATUS: 2 - Symptomatic, <50% confined to bed  Vitals:   04/19/20 1430 04/19/20 1522  BP: (!) 165/89 (!) 178/83  Pulse: (!) 115 (!) 109  Resp: (!) 24 (!) 21  Temp:  98 F (36.7 C)  SpO2: 96% 97%   Filed Weights   04/19/20 1118  Weight: 95.3 kg    Intake/Output from previous day: No intake/output data recorded.  GENERAL:alert, no distress and comfortable SKIN: skin color, texture, turgor are normal, no rashes or significant lesions EYES: normal, Conjunctiva are pink and non-injected, sclera clear OROPHARYNX:no exudate, no erythema and lips, buccal mucosa, and tongue normal  LUNGS: clear to auscultation and percussion with normal breathing effort HEART: regular rate & rhythm and no murmurs  and no lower extremity edema ABDOMEN: Significant abdominal distention Musculoskeletal:no cyanosis of digits  and no clubbing  NEURO: alert & oriented x 3 with fluent speech, no focal motor/sensory deficits  LABORATORY DATA:  I have reviewed the data as listed CMP Latest Ref Rng & Units 04/19/2020 04/19/2020 02/08/2020  Glucose 70 - 99 mg/dL 186(H) 169(H) 141(H)  BUN 8 - 23 mg/dL $Remove'11 9 18  'ohIOmPP$ Creatinine 0.61 - 1.24 mg/dL 1.05 1.13 0.88  Sodium 135 - 145 mmol/L 146(H) 144 143  Potassium 3.5 - 5.1 mmol/L 3.2(L) 3.2(L) 4.3  Chloride 98 - 111 mmol/L 102 102 108  CO2 22 - 32 mmol/L 30 32 26  Calcium 8.9 - 10.3 mg/dL 12.4(H) 12.3(H) 9.6  Total Protein 6.5 - 8.1 g/dL 7.2 6.5 6.3(L)  Total Bilirubin 0.3 - 1.2 mg/dL 0.9 0.7 0.6  Alkaline Phos 38 - 126 U/L 82 89 78  AST 15 - 41 U/L $Remo'19 16 20  'uZBWK$ ALT 0 - 44 U/L $Remo'12 10 18    'EdELx$ Lab Results  Component Value Date   WBC 11.3 (H) 04/19/2020   HGB 12.2 (L) 04/19/2020   HCT 37.3 (L) 04/19/2020   MCV 85.7 04/19/2020   PLT 197 04/19/2020   NEUTROABS 6.4 04/19/2020    CT ABDOMEN PELVIS WO CONTRAST  Addendum Date: 04/19/2020   ADDENDUM REPORT: 04/19/2020 12:54 ADDENDUM: The peritoneal fluid within the pelvis is not enhancing as this is a non IV contrast imaging exam. Therefore would described fluid collection as having thickened rim. Favor peritoneal metastasis. Findings conveyed toSCOTT GOLDSTON on 04/19/2020  at12:54. Electronically Signed   By: Suzy Bouchard M.D.   On: 04/19/2020 12:54   Result Date: 04/19/2020 CLINICAL DATA:  Abdominal pain.  Tachycardia.  Mantle cell lymphoma EXAM: CT ABDOMEN AND PELVIS WITHOUT CONTRAST TECHNIQUE: Multidetector CT imaging of the abdomen and pelvis was performed following the standard protocol without IV contrast. COMPARISON:  PET-CT scan 01/02/2020 FINDINGS: Lower chest: New small RIGHT effusion. No pericardial effusion. There is new precordial adenopathy anterior to the RIGHT heart and liver (image 9/2). Hepatobiliary: New intraperitoneal free fluid along the margin of the RIGHT hepatic lobe. This fluid is simple attenuation. The  liver appears unchanged on noncontrast exam. Large cyst the LEFT hepatic lobe. Gallbladder normal. Pancreas: Pancreas is grossly normal noncontrast exam Spleen: . spleen is unchanged Adrenals/urinary tract: Adrenal glands kidneys and ureters normal. Bladder Stomach/Bowel: Stomach duodenum normal. There is a new mass lesion in the LEFT upper quadrant measuring 14.2 by 13.0 cm. The mass may be associated the small bowel or the small bowel mesentery. The descending colon is also involving the mass (image 43/2. There is extensive fluid within the leaves of the mesentery of the distal small bowel. Appendix normal. Ascending transverse colon normal. The descending colon tracks along the new LEFT upper quadrant mass. Vascular/Lymphatic: New large LEFT upper quadrant mass either represents lymphoma involving the small bowel or mesentery. Mass is very large described the stomach section. Additionally there is new periportal adenopathy with enlarged lymph nodes along the aorta measuring up to 2 cm (image 44/2. There is nodularity along the greater omentum abdominal on the LEFT. Reproductive: Prostate unremarkable Other: Small volume free fluid the pelvis. There is enhancing rim to the fluid (image 73/2 which could indicate peritonitis. Musculoskeletal: No aggressive osseous lesion. IMPRESSION: 1. Dominant finding is new large (15 cm) lymphoid mass within LEFT upper quadrant. Mass is new from PET-CT 01/02/2020. Differential includes massive mesenteric adenopathy versus lymphoma of the small bowel or  descending colon. No oral or IV contrast. 2. New periaortic retroperitoneal lymphadenopathy. New precordial adenopathy. 3. Extensive fluid within the mesentery and omental thickening in LEFT lower quadrant. 4. Free fluid the pelvis with thin enhancing rim suggest peritonitis. 5. No evidence of bowel perforation. No evidence of bowel obstruction. Electronically Signed: By: Suzy Bouchard M.D. On: 04/19/2020 12:39   DG Chest  Portable 1 View  Result Date: 04/19/2020 CLINICAL DATA:  Cough, wheezing EXAM: PORTABLE CHEST 1 VIEW COMPARISON:  01/09/2020 FINDINGS: Low lung volumes. Minimal atelectasis or scarring at the left lung base. No pleural effusion or pneumothorax. Heart size is within normal limits for technique. IMPRESSION: Minimal atelectasis or scarring at the left lung base. Electronically Signed   By: Macy Mis M.D.   On: 04/19/2020 12:18    ASSESSMENT AND PLAN: 69 yo male with   1) Stage IV Mantle cell cell lymphoma CLL FISH shows 17p mutation -04/26/2019 Flow Cytometry [phone]) revealed "Abnormal B-cell population identified. CD5+ clonal lymphoproliferative disorder" -04/26/2019 FISH revealed "Positive for p53 (17p13) deletion." -05/31/2019 PET/CT ([id number]) revealed "1. There are multiple prominent lymph nodes in the neck, both axilla, the retroperitoneum and pelvis with low level hypermetabolic activity consistent with chronic lymphocytic leukemia. Deauville 3 and 4. 2. Mild splenomegaly and mild generalized splenic hypermetabolic activity 3. No evidence of bone marrow involvement." -06/10/2019 Flow Pathology Report ([id number]) revealed "Abnormal B-cell population." -06/10/2019 Surgical Pathology Report ([id number])  revealed "Atypical lymphoid proliferation consistent with mantle cell lymphoma." -09/19/19 PET Skull Base to Thigh ([id number])-- favorable response to current treatment -01/02/2020 PET/CT ([id number]) revealed "1. Continued further decrease in size and FDG accumulation in index cervical, axillary, retroperitoneal, and pelvic lymph nodes. No lymphadenopathy by size criteria today. FDG accumulation in these lymph nodes is compatible with Deauville 2 category. 2. Interval decrease in size with resolution of hypermetabolism associated with the spleen previously."  -04/19/2020 CT abdomen pelvis revealed "1. Dominant finding is new large (15 cm) lymphoid mass within LEFT upper  quadrant. Mass is new from PET-CT 01/02/2020. Differential includes massive mesenteric adenopathy versus lymphoma of the small bowel or descending colon. No oral or IV contrast. 2. New periaortic retroperitoneal lymphadenopathy. New precordial Adenopathy. 3. Extensive fluid within the mesentery and omental thickening in LEFT lower quadrant. 4. Free fluid the pelvis with thin enhancing rim suggest peritonitis. 5. No evidence of bowel perforation. No evidence of bowel Obstruction."  2) Rigors from Rituxan - needing demerol as premedication. Rituxan rate to be capped at $RemoveB'150mg'uumkHXhE$ /hour  3) Thrombocytopenia from chemotherapy  4) Neutropenia ? Drug related. ? Idiosyncratic reaction to Rituxan  5) Rituxan related ILD  6) significant new abdominal distention due to lymphoma recurrence  7) new hypercalcemia- ?  Related to dehydration, ?  Lymphoma recurrence or other etiologies.  PLAN: -Discussed with patient the labs and his clinical symptomatology in details. -Discussed with him the concerns for his acute abdominal change with possible etiologies of complete/partial small bowel obstruction or recurrence of his mantle cell lymphoma in his abdomen or other possible causes of acute abdomen. -CT scan results have been discussed with the patient which are concerning for lymphoma recurrence.  Recommend chemotherapy as soon as possible with [facility].  Adverse effects of this regimen have been discussed with the patient including but not limited to alopecia, myelosuppression, nausea, vomiting, peripheral neuropathy, renal and liver dysfunction.  He agrees to proceed.  Anticipate first dose on 10/29 or 10/30. -We will begin prednisone 80 mg daily starting today.  He has been started on  a PPI.  Monitor CBGs closely. -Begin allopurinol 300 mg daily due to risk for TLS. -I have requested for hospitalist to place order to transfer patient to the oncology unit. -Order placed for PICC line for chemotherapy  administration. -Echocardiogram ordered. -Treat hypercalcemia with IV fluids.  Treatment of lymphoma should help correct his hypercalcemia.  Will hold on Zometa at this point. -Hypercalcemia work-up -with PTH, PTH RP, myeloma panel, 125 hydroxy vitamin D, 25-hydroxy vitamin D, lymphoma work-up with CT chest. -If significant bowel distention on CT abdomen might need NG decompression.    LOS: 0 days   Mikey Bussing, DNP, AGPCNP-BC, AOCNP 04/19/20

## 2020-04-19 NOTE — ED Notes (Signed)
Attempted to call report to Tess, Therapist, sports. She is unavailable at this time and asks to be called back.

## 2020-04-19 NOTE — Progress Notes (Signed)
Patient presented to IR for paracentesis. Limited ultrasound exam did not identify a pocket of fluid sufficient for percutaneous drainage. Images saved in Epic. Dr. Laurence Ferrari notified.   Please call IR with any questions.  Soyla Dryer,  [phone] 04/19/2020, 2:49 PM

## 2020-04-19 NOTE — H&P (Signed)
History and Physical        Hospital Admission Note Date: 04/19/2020  Patient name: Dean Dixon Medical record number: [id number] Date of birth: May 06, 1951 Age: 69 y.o. Gender: male  PCP: Hamrick, Lorin Mercy, MD  Patient coming from: home   Chief Complaint    No chief complaint on file.     HPI:   This is a 69 year old male who has been vaccinated against COVID-19 with a past medical history of mantle cell lymphoma (follows with Dr. Irene Limbo, oncology), provoked VTE, hypertension, diabetes who was seen this a.m. by Dr. Irene Limbo for a routine visit and noted progressive abdominal distention and discomfort for the last 3 weeks with fatigue and decreased p.o. intake, found to have hypercalcemia (12.3) and was sent to the ED from the cancer center for further evaluation.  Upon presentation he was noted to be wheezing and coughing with SPO2 89% and placed on 2 L/min.  Has been having some constipation for which he has been taking milk of magnesia.  Currently, he admits to some shortness of breath, cough which is minimally productive and wheezing without a history of COPD.  Says that he only really became short of breath since coming to the ED.  Denies any chest pain, nausea however did have an episode of vomiting in the ED which was posttussive.  ED Course: Afebrile, tachycardic, tachypneic, hypertensive requiring 2 LPM.  Notable labs: K3.2, creatinine 1.13 (previously 0.8), calcium 12.3, albumin 4.0, BNP 122, LDH 284, lactic acid 2.3, WBC 11.3 (previously 2.5 in August, baseline is about 2), Hb 12.2.  CXR with atelectasis.  CT abdomen/pelvis with new large 15 cm lymphoid mass within the LUQ (new from PET/CT on 01/02/2020), new periaortic retroperitoneal lymphadenopathy and precordial adenopathy, extensive fluid in the mesentery and omental thickening LLQ, pelvic fluid with thickened rim favoring  peritoneal metastasis, no evidence of bowel obstruction.  He was given 1 L LR bolus and albuterol inhaler.  Vitals:   04/19/20 1430 04/19/20 1522  BP: (!) 165/89 (!) 178/83  Pulse: (!) 115 (!) 109  Resp: (!) 24 (!) 21  Temp:  98 F (36.7 C)  SpO2: 96% 97%     Review of Systems:  Review of Systems  Constitutional: Negative for chills and fever.  Respiratory: Positive for cough, sputum production, shortness of breath and wheezing.   Cardiovascular: Negative for chest pain.  Gastrointestinal: Positive for abdominal pain and vomiting. Negative for nausea.  Genitourinary: Negative.   Musculoskeletal: Negative.   All other systems reviewed and are negative.   Medical/Social/Family History   Past Medical History: Past Medical History:  Diagnosis Date  . Cancer ([place])    Mantle Cell Lymphoma  . Diabetes mellitus without complication ([place])   . Hypertension     Past Surgical History:  Procedure Laterality Date  . APPENDECTOMY    . TONSILLECTOMY      Medications: Prior to Admission medications   Medication Sig Start Date End Date Taking? Authorizing Provider  amLODipine (NORVASC) 10 MG tablet Take 10 mg by mouth daily.    [provider]  Ascorbic Acid (VITAMIN C) 500 MG CAPS Take 1 capsule by mouth daily.    [provider]  blood glucose  meter kit and supplies Check blood sugar 4 times daily (Before breakfast, lunch, dinner, bedtime). (FOR ICD-10 E10.9, E11.9). 11/17/19   Mariel Aloe, MD  CANNABIDIOL PO Take 30 mg by mouth daily.    [provider]  Coenzyme Q10 (COQ-10 PO) Take 1 capsule by mouth daily. Takes CoQ 10 w/cinnamon    [provider]  Glucosamine-Chondroitin (GLUCOSAMINE CHONDR COMPLEX PO) Take 1 tablet by mouth daily.    [provider]  insulin detemir (LEVEMIR) 100 UNIT/ML FlexPen Inject 10 Units into the skin at bedtime for 5 days. 11/17/19 11/22/19  Mariel Aloe, MD  Insulin Pen Needle 31G X 5 MM MISC BD Pen  Needles- brand specific Inject insulin via insulin pen 6 x daily 11/17/19   Mariel Aloe, MD  LORazepam (ATIVAN) 0.5 MG tablet Take 1 tablet (0.5 mg total) by mouth every 6 (six) hours as needed (Nausea or vomiting). 06/20/19   Brunetta Genera, MD  metFORMIN (GLUCOPHAGE-XR) 500 MG 24 hr tablet Take 500 mg by mouth in the morning and at bedtime.  04/09/19   [provider]  Multiple Vitamin (MULTIVITAMIN) tablet Take 1 tablet by mouth daily.    [provider]  ondansetron (ZOFRAN) 8 MG tablet Take 1 tablet (8 mg total) by mouth 2 (two) times daily as needed for refractory nausea / vomiting. Start on day 2 after bendamustine chemotherapy. 06/20/19   Brunetta Genera, MD  pantoprazole (PROTONIX) 40 MG tablet Take 1 tablet (40 mg total) by mouth daily at 12 noon for 4 days. 11/18/19 11/22/19  Mariel Aloe, MD  prochlorperazine (COMPAZINE) 10 MG tablet Take 1 tablet (10 mg total) by mouth every 6 (six) hours as needed (Nausea or vomiting). 06/20/19   Brunetta Genera, MD  ramipril (ALTACE) 10 MG capsule Take 10 mg by mouth daily.    [provider]  ZITHROMAX Z-PAK 250 MG tablet Take two tablets today Then take one tablet daily until gone. Check with Oncology as we discussed  prior to starting Patient not taking: Reported on 02/08/2020 12/09/19   Magdalen Spatz, NP    Allergies:   Allergies  Allergen Reactions  . Ivp Dye [Iodinated Diagnostic Agents] Shortness Of Breath    "allergic to CT scan dye", took benadryl with last scan and tolerated well    Social History:  reports that he has never smoked. He has never used smokeless tobacco. He reports current alcohol use of about 2.0 standard drinks of alcohol per week. He reports that he does not use drugs.  Family History: No family history on file.   Objective   Physical Exam: Blood pressure (!) 178/83, pulse (!) 109, temperature 98 F (36.7 C), temperature source Oral, resp. rate (!) 21, height _0   (1.626 m), weight 95.3 kg, SpO2 97 %.  Physical Exam Vitals and nursing note reviewed.  Constitutional:      Appearance: He is ill-appearing, toxic-appearing and diaphoretic.  HENT:     Head: Normocephalic.     Mouth/Throat:     Mouth: Mucous membranes are moist.  Eyes:     Conjunctiva/sclera: Conjunctivae normal.  Cardiovascular:     Rate and Rhythm: Regular rhythm. Tachycardia present.  Pulmonary:     Effort: Respiratory distress present.     Breath sounds: Wheezing present.     Comments: Audible wheezes diffusely Audible secretions  tachypnea Abdominal:     General: There is distension.     Palpations: There is no mass.  Tenderness: There is no guarding.  Musculoskeletal:     Comments: Trace lower extremity edema bilaterally  Skin:    Coloration: Skin is not jaundiced.     Findings: No bruising.  Neurological:     Mental Status: He is alert. Mental status is at baseline.  Psychiatric:        Mood and Affect: Mood normal.        Behavior: Behavior normal.     LABS on Admission: I have personally reviewed all the labs and imaging below    Basic Metabolic Panel: Recent Labs  Lab 04/19/20 0922 04/19/20 1132  NA 144 146*  K 3.2* 3.2*  CL 102 102  CO2 32 30  GLUCOSE 169* 186*  BUN 9 11  CREATININE 1.13 1.05  CALCIUM 12.3* 12.4*   Liver Function Tests: Recent Labs  Lab 04/19/20 0922 04/19/20 1132  AST 16 19  ALT 10 12  ALKPHOS 89 82  BILITOT 0.7 0.9  PROT 6.5 7.2  ALBUMIN 4.0 4.4   No results for input(s): LIPASE, AMYLASE in the last 168 hours. No results for input(s): AMMONIA in the last 168 hours. CBC: Recent Labs  Lab 04/19/20 0922  WBC 11.3*  NEUTROABS 6.4  HGB 12.2*  HCT 37.3*  MCV 85.7  PLT 197   Cardiac Enzymes: No results for input(s): CKTOTAL, CKMB, CKMBINDEX, TROPONINI in the last 168 hours. BNP: Invalid input(s): POCBNP CBG: No results for input(s): GLUCAP in the last 168 hours.  Radiological Exams on Admission:  CT  ABDOMEN PELVIS WO CONTRAST  Addendum Date: 04/19/2020   ADDENDUM REPORT: 04/19/2020 12:54 ADDENDUM: The peritoneal fluid within the pelvis is not enhancing as this is a non IV contrast imaging exam. Therefore would described fluid collection as having thickened rim. Favor peritoneal metastasis. Findings conveyed toSCOTT GOLDSTON on 04/19/2020  at12:54. Electronically Signed   By: Suzy Bouchard M.D.   On: 04/19/2020 12:54   Result Date: 04/19/2020 CLINICAL DATA:  Abdominal pain.  Tachycardia.  Mantle cell lymphoma EXAM: CT ABDOMEN AND PELVIS WITHOUT CONTRAST TECHNIQUE: Multidetector CT imaging of the abdomen and pelvis was performed following the standard protocol without IV contrast. COMPARISON:  PET-CT scan 01/02/2020 FINDINGS: Lower chest: New small RIGHT effusion. No pericardial effusion. There is new precordial adenopathy anterior to the RIGHT heart and liver (image 9/2). Hepatobiliary: New intraperitoneal free fluid along the margin of the RIGHT hepatic lobe. This fluid is simple attenuation. The liver appears unchanged on noncontrast exam. Large cyst the LEFT hepatic lobe. Gallbladder normal. Pancreas: Pancreas is grossly normal noncontrast exam Spleen: . spleen is unchanged Adrenals/urinary tract: Adrenal glands kidneys and ureters normal. Bladder Stomach/Bowel: Stomach duodenum normal. There is a new mass lesion in the LEFT upper quadrant measuring 14.2 by 13.0 cm. The mass may be associated the small bowel or the small bowel mesentery. The descending colon is also involving the mass (image 43/2. There is extensive fluid within the leaves of the mesentery of the distal small bowel. Appendix normal. Ascending transverse colon normal. The descending colon tracks along the new LEFT upper quadrant mass. Vascular/Lymphatic: New large LEFT upper quadrant mass either represents lymphoma involving the small bowel or mesentery. Mass is very large described the stomach section. Additionally there is new  periportal adenopathy with enlarged lymph nodes along the aorta measuring up to 2 cm (image 44/2. There is nodularity along the greater omentum abdominal on the LEFT. Reproductive: Prostate unremarkable Other: Small volume free fluid the pelvis. There is enhancing rim to the  fluid (image 73/2 which could indicate peritonitis. Musculoskeletal: No aggressive osseous lesion. IMPRESSION: 1. Dominant finding is new large (15 cm) lymphoid mass within LEFT upper quadrant. Mass is new from PET-CT 01/02/2020. Differential includes massive mesenteric adenopathy versus lymphoma of the small bowel or descending colon. No oral or IV contrast. 2. New periaortic retroperitoneal lymphadenopathy. New precordial adenopathy. 3. Extensive fluid within the mesentery and omental thickening in LEFT lower quadrant. 4. Free fluid the pelvis with thin enhancing rim suggest peritonitis. 5. No evidence of bowel perforation. No evidence of bowel obstruction. Electronically Signed: By: Suzy Bouchard M.D. On: 04/19/2020 12:39   DG Chest Portable 1 View  Result Date: 04/19/2020 CLINICAL DATA:  Cough, wheezing EXAM: PORTABLE CHEST 1 VIEW COMPARISON:  01/09/2020 FINDINGS: Low lung volumes. Minimal atelectasis or scarring at the left lung base. No pleural effusion or pneumothorax. Heart size is within normal limits for technique. IMPRESSION: Minimal atelectasis or scarring at the left lung base. Electronically Signed   By: Macy Mis M.D.   On: 04/19/2020 12:18      EKG: Independently reviewed.    A & P   Principal Problem:   Hypercalcemia Active Problems:   HTN (hypertension)   Mantle cell lymphoma of lymph nodes of multiple regions ([place])   CAP (community acquired pneumonia)   Diabetes mellitus without complication ([place])   Acute respiratory failure with hypoxia ([facility])   Sepsis ([place])   Acute diastolic CHF (congestive heart failure) ([place])   1. Hypercalcemia, likely from malignancy a. Calcium 12.4 b. Received 1 L LR  bolus but became short of breath and was given a dose of Lasix c. Follow-up CMP this afternoon  2. Sepsis without septic shock, concern for CAP a. Sepsis criteria: Tachycardic, tachypneic, leukocytosis, lactic acidosis and hypoxic b. CXR with atelectasis but given his abnormal vitals and clinical status we will start him on CAP therapy with ceftriaxone and azithromycin c. Follow-up blood cultures d. Hold further IV fluids as he became hypervolemic  3. Concern for diastolic heart failure exacerbation a. Echo 11/14/2019 with EF 50 to 55% and grade 1 diastolic dysfunction b. BNP minimally elevated elevated: 122 c. Clinically appears volume overloaded d. Stop IV fluids and given a dose of Lasix 20 mg IV x1 e. Monitor intake and output and daily weights  4. Acute hypoxic respiratory failure likely secondary to diastolic heart failure exacerbation and CAP a. Currently on 2 LPM, 0 LPM at baseline b. With audible secretions, wheezes on exam c. Treatment as above d. Antitussives e. Incentive spirometry f. DuoNeb trial  5. Mantle cell lymphoma with abdominal distention suspect secondary to metastatic disease a. CT abdomen/pelvis with new large 15 cm lymphoid mass within the LUQ (new from PET/CT on 01/02/2020), new periaortic retroperitoneal lymphadenopathy and precordial adenopathy, extensive fluid in the mesentery and omental thickening LLQ, pelvic fluid with thickened rim favoring peritoneal metastasis - contrast was not given due to dye allergy listed b. Will get a CT chest to further stage c. US paracentesis ordered but no fluid pocket identified for sufficient drainage d. Follows with Dr. Irene Limbo, Oncology on board  6. Hypertension a. Continue home amlodipine and ramipril, monitor for renal dysfunction  7. Diabetes a. HbA1c in May was well controlled b. Sliding scale for now  8. Hypokalemia a. Replete PO   DVT prophylaxis: Lovenox   Code Status: Full Code  Diet: Heart healthy/carb  modified Family Communication: Admission, patients condition and plan of care including tests being ordered have been discussed with  the patient who indicates understanding and agrees with the plan and Code Status.  Disposition Plan: The appropriate patient status for this patient is INPATIENT. Inpatient status is judged to be reasonable and necessary in order to provide the required intensity of service to ensure the patient's safety. The patient's presenting symptoms, physical exam findings, and initial radiographic and laboratory data in the context of their chronic comorbidities is felt to place them at high risk for further clinical deterioration. Furthermore, it is not anticipated that the patient will be medically stable for discharge from the hospital within 2 midnights of admission. The following factors support the patient status of inpatient.   " The patient's presenting symptoms include abnormal lab work, shortness of breath. " The worrisome physical exam findings include tachypnea, diaphoresis, abdominal distention. " The initial radiographic and laboratory data are worrisome because of abdominal metastatic disease. " The chronic co-morbidities include mantle cell lymphoma, diastolic heart failure.   * I certify that at the point of admission it is my clinical judgment that the patient will require inpatient hospital care spanning beyond 2 midnights from the point of admission due to high intensity of service, high risk for further deterioration and high frequency of surveillance required.*  The medical decision making on this patient was of high complexity and the patient is at high risk for clinical deterioration, therefore this is a level 3 admission.  Consultants  . Oncology  Procedures  . None  Time Spent on Admission: 73 minutes    Harold Hedge, DO [facility]  04/19/2020, 4:21 PM

## 2020-04-19 NOTE — ED Notes (Signed)
Report given to Tess,RN

## 2020-04-19 NOTE — Progress Notes (Signed)
Off unit for CT SCAN.

## 2020-04-20 ENCOUNTER — Other Ambulatory Visit: Payer: Self-pay | Admitting: Hematology

## 2020-04-20 ENCOUNTER — Inpatient Hospital Stay (HOSPITAL_COMMUNITY): Payer: Medicare Other

## 2020-04-20 ENCOUNTER — Telehealth: Payer: Self-pay | Admitting: Pharmacist

## 2020-04-20 ENCOUNTER — Telehealth: Payer: Self-pay

## 2020-04-20 DIAGNOSIS — C8318 Mantle cell lymphoma, lymph nodes of multiple sites: Secondary | ICD-10-CM

## 2020-04-20 DIAGNOSIS — Z0189 Encounter for other specified special examinations: Secondary | ICD-10-CM

## 2020-04-20 LAB — CBC
HCT: 33.3 % — ABNORMAL LOW (ref 39.0–52.0)
Hemoglobin: 10.8 g/dL — ABNORMAL LOW (ref 13.0–17.0)
MCH: 28.6 pg (ref 26.0–34.0)
MCHC: 32.4 g/dL (ref 30.0–36.0)
MCV: 88.1 fL (ref 80.0–100.0)
Platelets: 171 10*3/uL (ref 150–400)
RBC: 3.78 MIL/uL — ABNORMAL LOW (ref 4.22–5.81)
RDW: 17.7 % — ABNORMAL HIGH (ref 11.5–15.5)
WBC: 14.1 10*3/uL — ABNORMAL HIGH (ref 4.0–10.5)
nRBC: 0.1 % (ref 0.0–0.2)

## 2020-04-20 LAB — ECHOCARDIOGRAM COMPLETE
AR max vel: 2.42 cm2
AV Area VTI: 2.42 cm2
AV Area mean vel: 2.14 cm2
AV Mean grad: 11.1 mmHg
AV Peak grad: 16.7 mmHg
Ao pk vel: 2.04 m/s
Area-P 1/2: 4.06 cm2
Height: 64 in
S' Lateral: 2.6 cm
Weight: 3361.57 oz

## 2020-04-20 LAB — BASIC METABOLIC PANEL
Anion gap: 16 — ABNORMAL HIGH (ref 5–15)
BUN: 16 mg/dL (ref 8–23)
CO2: 26 mmol/L (ref 22–32)
Calcium: 11 mg/dL — ABNORMAL HIGH (ref 8.9–10.3)
Chloride: 101 mmol/L (ref 98–111)
Creatinine, Ser: 1.04 mg/dL (ref 0.61–1.24)
GFR, Estimated: >60 mL/min (ref >60)
Glucose, Bld: 211 mg/dL — ABNORMAL HIGH (ref 70–99)
Potassium: 3.4 mmol/L — ABNORMAL LOW (ref 3.5–5.1)
Sodium: 143 mmol/L (ref 135–145)

## 2020-04-20 LAB — GLUCOSE, CAPILLARY
Glucose-Capillary: 178 mg/dL — ABNORMAL HIGH (ref 70–99)
Glucose-Capillary: 119 mg/dL — ABNORMAL HIGH (ref 70–99)
Glucose-Capillary: 186 mg/dL — ABNORMAL HIGH (ref 70–99)
Glucose-Capillary: 211 mg/dL — ABNORMAL HIGH (ref 70–99)

## 2020-04-20 MED ORDER — ACALABRUTINIB 100 MG PO CAPS: 100.0000 mg | ORAL_CAPSULE | Freq: Two times a day (BID) | ORAL | 1 refills | Status: DC

## 2020-04-20 MED ORDER — SODIUM CHLORIDE 0.9% FLUSH
10.0000 mL | Freq: Two times a day (BID) | INTRAVENOUS | Status: DC
  Administered 2020-04-20 – 2020-04-23 (×4): 10 mL

## 2020-04-20 MED ORDER — POTASSIUM CHLORIDE CRYS ER 20 MEQ PO TBCR
20.0000 meq | EXTENDED_RELEASE_TABLET | Freq: Two times a day (BID) | ORAL | Status: AC
  Administered 2020-04-20 (×2): 20 meq via ORAL
  Filled 2020-04-20 (×2): qty 1

## 2020-04-20 MED ORDER — HOT PACK MISC ONCOLOGY
1.0000 | Freq: Once | Status: AC | PRN
  Filled 2020-04-20: qty 1

## 2020-04-20 MED ORDER — SODIUM CHLORIDE 0.9 % IV SOLN
10.0000 mg | Freq: Once | INTRAVENOUS | Status: AC
  Administered 2020-04-21: 10 mg via INTRAVENOUS
  Filled 2020-04-20: qty 1

## 2020-04-20 MED ORDER — CHLORHEXIDINE GLUCONATE CLOTH 2 % EX PADS
6.0000 | MEDICATED_PAD | Freq: Every day | CUTANEOUS | Status: DC
  Administered 2020-04-21 – 2020-04-22 (×2): 6 via TOPICAL

## 2020-04-20 MED ORDER — SODIUM CHLORIDE 0.9% FLUSH: 10.0000 mL | INTRAVENOUS | Status: DC | PRN

## 2020-04-20 MED ORDER — SODIUM CHLORIDE 0.9 % IV SOLN
750.0000 mg/m2 | Freq: Once | INTRAVENOUS | Status: AC
  Administered 2020-04-21: 1560 mg via INTRAVENOUS
  Filled 2020-04-20: qty 78

## 2020-04-20 MED ORDER — SODIUM CHLORIDE 0.9 % IV SOLN: Freq: Once | INTRAVENOUS | Status: AC

## 2020-04-20 MED ORDER — SODIUM CHLORIDE 0.9 % IV SOLN
150.0000 mg | Freq: Once | INTRAVENOUS | Status: AC
  Administered 2020-04-21: 150 mg via INTRAVENOUS
  Filled 2020-04-20: qty 5

## 2020-04-20 MED ORDER — DOXORUBICIN HCL CHEMO IV INJECTION 2 MG/ML
48.0000 mg/m2 | Freq: Once | INTRAVENOUS | Status: AC
  Administered 2020-04-21: 100 mg via INTRAVENOUS
  Filled 2020-04-20: qty 50

## 2020-04-20 MED ORDER — VINCRISTINE SULFATE CHEMO INJECTION 1 MG/ML
2.0000 mg | Freq: Once | INTRAVENOUS | Status: AC
  Administered 2020-04-21: 2 mg via INTRAVENOUS
  Filled 2020-04-20: qty 2

## 2020-04-20 MED ORDER — COLD PACK MISC ONCOLOGY
1.0000 | Freq: Once | Status: AC | PRN
  Filled 2020-04-20: qty 1

## 2020-04-20 MED ORDER — PALONOSETRON HCL INJECTION 0.25 MG/5ML
0.2500 mg | Freq: Once | INTRAVENOUS | Status: AC
  Administered 2020-04-21: 0.25 mg via INTRAVENOUS
  Filled 2020-04-20: qty 5

## 2020-04-20 NOTE — Progress Notes (Signed)
DISCONTINUE ON PATHWAY REGIMEN - Lymphoma and CLL     A cycle is every 28 days:     Bendamustine      Rituximab-xxxx   **Always confirm dose/schedule in your pharmacy ordering system**  REASON: Disease Progression PRIOR TREATMENT: [id number]: Bendamustine + Rituximab IV (90/375) q28 Days x 6 Cycles (or 2 Cycles Beyond CR) TREATMENT RESPONSE: Complete Response (CR)  START OFF PATHWAY REGIMEN - Other   [id number]:CHOP q21 days + Pegfilgrastim:   A cycle is every 21 days:     Cyclophosphamide      Doxorubicin      Vincristine      Prednisone      Pegfilgrastim-xxxx   **Always confirm dose/schedule in your pharmacy ordering system**  Patient Characteristics: Intent of Therapy: Non-Curative / Palliative Intent, Discussed with Patient

## 2020-04-20 NOTE — Telephone Encounter (Signed)
Encounter opened in error

## 2020-04-20 NOTE — TOC Progression Note (Signed)
Transition of Care [facility]) - Progression Note    Patient Details  Name: Dean Dixon MRN: [id number] Date of Birth: 05-03-1951  [facility] CM/SW Contact  Purcell Mouton, RN Phone Number: 04/20/2020, 1:53 PM  Clinical Narrative:     Pt from home alone. TOC will continue to follow.  Expected Discharge Plan: Home/Self Care Barriers to Discharge: No Barriers Identified  Expected Discharge Plan and Services Expected Discharge Plan: Home/Self Care       Living arrangements for the past 2 months: Single Family Home                                       Social Determinants of Health (SDOH) Interventions    Readmission Risk Interventions No flowsheet data found.

## 2020-04-20 NOTE — Progress Notes (Signed)
  Echocardiogram 2D Echocardiogram has been performed.  Dean Dixon 04/20/2020, 8:23 AM

## 2020-04-20 NOTE — Progress Notes (Addendum)
HEMATOLOGY-ONCOLOGY PROGRESS NOTE  SUBJECTIVE: The patient states that he feels little better this morning.  Thinks abdomen is less distended.  He has been started on prednisone 80 mg daily.  Echo was performed this morning.  Awaiting PICC line placement.  Also awaiting transfer to the 6 floor in for chemotherapy.  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 ([place])  06/20/2019 Initial Diagnosis   Mantle cell lymphoma of lymph nodes of multiple regions ([place])   06/29/2019 - 10/20/2019 Chemotherapy   The patient had dexamethasone (DECADRON) 4 MG tablet, 8 mg, Oral, Daily, 1 of 1 cycle, Start date: 06/20/2019, End date: 11/17/2019 palonosetron (ALOXI) injection 0.25 mg, 0.25 mg, Intravenous,  Once, 5 of 5 cycles Administration: 0.25 mg (07/27/2019), 0.25 mg (06/30/2019), 0.25 mg (08/24/2019), 0.25 mg (10/20/2019), 0.25 mg (09/21/2019) pegfilgrastim (NEULASTA ONPRO KIT) injection 6 mg, 6 mg, Subcutaneous, Once, 3 of 3 cycles Administration: 6 mg (08/25/2019), 6 mg (09/22/2019) bendamustine (BENDEKA) 200 mg in sodium chloride 0.9 % 50 mL (3.4483 mg/mL) chemo infusion, 90 mg/m2 = 200 mg, Intravenous,  Once, 5 of 5 cycles Administration: 200 mg (06/29/2019), 200 mg (06/30/2019), 200 mg (07/27/2019), 200 mg (07/28/2019), 200 mg (08/24/2019), 200 mg (08/25/2019), 200 mg (09/21/2019), 200 mg (09/22/2019) riTUXimab-pvvr (RUXIENCE) 800 mg in sodium chloride 0.9 % 250 mL (2.4242 mg/mL) infusion, 375 mg/m2 = 800 mg, Intravenous,  Once, 5 of 5 cycles Dose modification: 375 mg/m2 (original dose 375 mg/m2, Cycle 2), 400 mg (original dose 375 mg/m2, Cycle 2, Reason: Other (see comments), Comment: finishing rituximab from previous day, infusion not completed), 300 mg (original dose 300 mg, Cycle 4) Administration: 800 mg (06/29/2019), 800 mg (07/27/2019), 800 mg (08/24/2019), 400 mg (07/28/2019), 800 mg (10/20/2019), 800 mg (09/21/2019), 300 mg (09/22/2019)  for chemotherapy treatment.    04/20/2020 -  Chemotherapy   The  patient had DOXOrubicin (ADRIAMYCIN) chemo injection 104 mg, 50 mg/m2 = 104 mg, Intravenous,  Once, 0 of 8 cycles palonosetron (ALOXI) injection 0.25 mg, 0.25 mg, Intravenous,  Once, 0 of 8 cycles vinCRIStine (ONCOVIN) 2 mg in sodium chloride 0.9 % 50 mL chemo infusion, 2 mg, Intravenous,  Once, 0 of 8 cycles cyclophosphamide (CYTOXAN) 1,560 mg in sodium chloride 0.9 % 250 mL chemo infusion, 750 mg/m2 = 1,560 mg, Intravenous,  Once, 0 of 8 cycles fosaprepitant (EMEND) 150 mg in sodium chloride 0.9 % 145 mL IVPB, 150 mg, Intravenous,  Once, 0 of 8 cycles  for chemotherapy treatment.       REVIEW OF SYSTEMS:   Constitutional: Denies fevers, chills Eyes: Denies blurriness of vision Ears, nose, mouth, throat, and face: Denies mucositis or sore throat Respiratory: Denies cough, dyspnea or wheezes Cardiovascular: Denies palpitation, chest discomfort Gastrointestinal: Reports abdominal distention and constipation, no nausea or vomiting Skin: Denies abnormal skin rashes Neurological:Denies numbness, tingling or new weaknesses Behavioral/Psych: Mood is stable, no new changes  Extremities: No lower extremity edema All other systems were reviewed with the patient and are negative.  I have reviewed the past medical history, past surgical history, social history and family history with the patient and they are unchanged from previous note.   PHYSICAL EXAMINATION: ECOG PERFORMANCE STATUS: 2 - Symptomatic, <50% confined to bed  Vitals:   04/19/20 2207 04/20/20 0518  BP: (!) 149/75 (!) 156/83  Pulse: 94 88  Resp: (!) 21 20  Temp: 98.4 F (36.9 C) 99.1 F (37.3 C)  SpO2: 93% 93%   Filed Weights   04/19/20 1118  Weight: 95.3  kg    Intake/Output from previous day: 10/28 0701 - 10/29 0700 In: 1590 [P.O.:840; IV Piggyback:750] Out: 400 [Urine:400]  GENERAL:alert, no distress and comfortable SKIN: skin color, texture, turgor are normal, no rashes or significant lesions EYES: normal,  Conjunctiva are pink and non-injected, sclera clear OROPHARYNX:no exudate, no erythema and lips, buccal mucosa, and tongue normal  LUNGS: clear to auscultation and percussion with normal breathing effort HEART: regular rate & rhythm and no murmurs and no lower extremity edema ABDOMEN: Significant abdominal distention Musculoskeletal:no cyanosis of digits and no clubbing  NEURO: alert & oriented x 3 with fluent speech, no focal motor/sensory deficits  LABORATORY DATA:  I have reviewed the data as listed CMP Latest Ref Rng & Units 04/20/2020 04/19/2020 04/19/2020  Glucose 70 - 99 mg/dL 211(H) 218(H) 186(H)  BUN 8 - 23 mg/dL _0 Creatinine 0.61 - 1.24 mg/dL 1.04 1.02 1.05  Sodium 135 - 145 mmol/L 143 146(H) 146(H)  Potassium 3.5 - 5.1 mmol/L 3.4(L) 3.1(L) 3.2(L)  Chloride 98 - 111 mmol/L 101 102 102  CO2 22 - 32 mmol/L _1 Calcium 8.9 - 10.3 mg/dL 11.0(H) 12.4(H) 12.4(H)  Total Protein 6.5 - 8.1 g/dL - 6.5 7.2  Total Bilirubin 0.3 - 1.2 mg/dL - 1.0 0.9  Alkaline Phos 38 - 126 U/L - 72 82  AST 15 - 41 U/L - 16 19  ALT 0 - 44 U/L - 12 12    Lab Results  Component Value Date   WBC 14.1 (H) 04/20/2020   HGB 10.8 (L) 04/20/2020   HCT 33.3 (L) 04/20/2020   MCV 88.1 04/20/2020   PLT 171 04/20/2020   NEUTROABS 11.1 (H) 04/19/2020    CT ABDOMEN PELVIS WO CONTRAST  Addendum Date: 04/19/2020   ADDENDUM REPORT: 04/19/2020 12:54 ADDENDUM: The peritoneal fluid within the pelvis is not enhancing as this is a non IV contrast imaging exam. Therefore would described fluid collection as having thickened rim. Favor peritoneal metastasis. Findings conveyed toSCOTT GOLDSTON on 04/19/2020  at12:54. Electronically Signed   By: Suzy Bouchard M.D.   On: 04/19/2020 12:54   Result Date: 04/19/2020 CLINICAL DATA:  Abdominal pain.  Tachycardia.  Mantle cell lymphoma EXAM: CT ABDOMEN AND PELVIS WITHOUT CONTRAST TECHNIQUE: Multidetector CT imaging of the abdomen and pelvis was performed  following the standard protocol without IV contrast. COMPARISON:  PET-CT scan 01/02/2020 FINDINGS: Lower chest: New small RIGHT effusion. No pericardial effusion. There is new precordial adenopathy anterior to the RIGHT heart and liver (image 9/2). Hepatobiliary: New intraperitoneal free fluid along the margin of the RIGHT hepatic lobe. This fluid is simple attenuation. The liver appears unchanged on noncontrast exam. Large cyst the LEFT hepatic lobe. Gallbladder normal. Pancreas: Pancreas is grossly normal noncontrast exam Spleen: . spleen is unchanged Adrenals/urinary tract: Adrenal glands kidneys and ureters normal. Bladder Stomach/Bowel: Stomach duodenum normal. There is a new mass lesion in the LEFT upper quadrant measuring 14.2 by 13.0 cm. The mass may be associated the small bowel or the small bowel mesentery. The descending colon is also involving the mass (image 43/2. There is extensive fluid within the leaves of the mesentery of the distal small bowel. Appendix normal. Ascending transverse colon normal. The descending colon tracks along the new LEFT upper quadrant mass. Vascular/Lymphatic: New large LEFT upper quadrant mass either represents lymphoma involving the small bowel or mesentery. Mass is very large described the stomach section. Additionally there is new periportal adenopathy with enlarged lymph nodes along the aorta  measuring up to 2 cm (image 44/2. There is nodularity along the greater omentum abdominal on the LEFT. Reproductive: Prostate unremarkable Other: Small volume free fluid the pelvis. There is enhancing rim to the fluid (image 73/2 which could indicate peritonitis. Musculoskeletal: No aggressive osseous lesion. IMPRESSION: 1. Dominant finding is new large (15 cm) lymphoid mass within LEFT upper quadrant. Mass is new from PET-CT 01/02/2020. Differential includes massive mesenteric adenopathy versus lymphoma of the small bowel or descending colon. No oral or IV contrast. 2. New  periaortic retroperitoneal lymphadenopathy. New precordial adenopathy. 3. Extensive fluid within the mesentery and omental thickening in LEFT lower quadrant. 4. Free fluid the pelvis with thin enhancing rim suggest peritonitis. 5. No evidence of bowel perforation. No evidence of bowel obstruction. Electronically Signed: By: Suzy Bouchard M.D. On: 04/19/2020 12:39   CT CHEST WO CONTRAST  Result Date: 04/19/2020 CLINICAL DATA:  Pneumonia. Pleural effusion. Concern for metastases. EXAM: CT CHEST WITHOUT CONTRAST TECHNIQUE: Multidetector CT imaging of the chest was performed following the standard protocol without IV contrast. COMPARISON:  CT dated Nov 14, 2019 FINDINGS: Cardiovascular: The heart size is stable. Coronary artery calcifications are noted. There is no significant pericardial effusion. Mediastinum/Nodes: -- No mediastinal lymphadenopathy. -- No hilar lymphadenopathy. --there is left axillary adenopathy. This has progressed since the prior study. There is mild right axillary adenopathy, progressed from prior study. -- No supraclavicular lymphadenopathy. -- Normal thyroid gland where visualized. -there is mild diffuse esophageal wall thickening. Lungs/Pleura: There are trace to small bilateral pleural effusions, right greater than left. There is atelectasis at the right lung base. There is no pneumothorax. No area of consolidation concerning for pneumonia. Upper Abdomen: There is a partially visualized mass in the left upper quadrant, better visualized on the patient's prior CT. There is free fluid in the upper abdomen. Multiple enlarged pericardiophrenic lymph nodes are noted. There appears to be retroperitoneal adenopathy. Musculoskeletal: No chest wall abnormality. No bony spinal canal stenosis. IMPRESSION: 1. Trace to small bilateral pleural effusions, right greater than left. There is atelectasis at the right lung base. No area of consolidation concerning for pneumonia. 2. Mild diffuse esophageal  wall thickening. Correlation with patient's symptoms is recommended. 3. Worsening axillary adenopathy. 4. Partially visualized mass in the left upper quadrant, better visualized on the patient's prior CT. 5. Small volume ascites. Aortic Atherosclerosis (ICD10-I70.0). Electronically Signed   By: Constance Holster M.D.   On: 04/19/2020 20:37   DG Chest Portable 1 View  Result Date: 04/19/2020 CLINICAL DATA:  Cough, wheezing EXAM: PORTABLE CHEST 1 VIEW COMPARISON:  01/09/2020 FINDINGS: Low lung volumes. Minimal atelectasis or scarring at the left lung base. No pleural effusion or pneumothorax. Heart size is within normal limits for technique. IMPRESSION: Minimal atelectasis or scarring at the left lung base. Electronically Signed   By: Macy Mis M.D.   On: 04/19/2020 12:18   [place] ASCITES (ABDOMEN LIMITED)  Result Date: 04/19/2020 CLINICAL DATA:  68 year old male with abdominal distension, ascites EXAM: LIMITED ABDOMEN ULTRASOUND FOR ASCITES TECHNIQUE: Limited ultrasound survey for ascites was performed in all four abdominal quadrants. COMPARISON:  CT abdomen/pelvis 04/19/2020 FINDINGS: Sonographic interrogation of the abdomen was performed in anticipation of paracentesis. Unfortunately, the volume is a ascites is quite small and insufficient for drainage. There is however extensive omental caking and peritoneal nodularity. IMPRESSION: 1. Small volume ascites, insufficient for paracentesis. 2. Extensive omental caking and peritoneal nodularity consistent with peritoneal carcinomatosis. Electronically Signed   By: Jacqulynn Cadet M.D.   On: 04/19/2020  16:53   [place] EKG SITE RITE  Result Date: 04/19/2020 If Site Rite image not attached, placement could not be confirmed due to current cardiac rhythm.   ASSESSMENT AND PLAN: 69 yo male with   1) Stage IV Mantle cell cell lymphoma CLL FISH shows 17p mutation -04/26/2019 Flow Cytometry [phone]) revealed "Abnormal B-cell population identified.  CD5+ clonal lymphoproliferative disorder" -04/26/2019 FISH revealed "Positive for p53 (17p13) deletion." -05/31/2019 PET/CT ([id number]) revealed "1. There are multiple prominent lymph nodes in the neck, both axilla, the retroperitoneum and pelvis with low level hypermetabolic activity consistent with chronic lymphocytic leukemia. Deauville 3 and 4. 2. Mild splenomegaly and mild generalized splenic hypermetabolic activity 3. No evidence of bone marrow involvement." -06/10/2019 Flow Pathology Report ([id number]) revealed "Abnormal B-cell population." -06/10/2019 Surgical Pathology Report ([id number])  revealed "Atypical lymphoid proliferation consistent with mantle cell lymphoma." -09/19/19 PET Skull Base to Thigh ([id number])-- favorable response to current treatment -01/02/2020 PET/CT ([id number]) revealed "1. Continued further decrease in size and FDG accumulation in index cervical, axillary, retroperitoneal, and pelvic lymph nodes. No lymphadenopathy by size criteria today. FDG accumulation in these lymph nodes is compatible with Deauville 2 category. 2. Interval decrease in size with resolution of hypermetabolism associated with the spleen previously."  -04/19/2020 CT abdomen pelvis revealed "1. Dominant finding is new large (15 cm) lymphoid mass within LEFT upper quadrant. Mass is new from PET-CT 01/02/2020. Differential includes massive mesenteric adenopathy versus lymphoma of the small bowel or descending colon. No oral or IV contrast. 2. New periaortic retroperitoneal lymphadenopathy. New precordial Adenopathy. 3. Extensive fluid within the mesentery and omental thickening in LEFT lower quadrant. 4. Free fluid the pelvis with thin enhancing rim suggest peritonitis. 5. No evidence of bowel perforation. No evidence of bowel Obstruction."  2) Rigors from Rituxan - needing demerol as premedication. Rituxan rate to be capped at 11m/hour  3) Thrombocytopenia from chemotherapy  4)  Neutropenia ? Drug related. ? Idiosyncratic reaction to Rituxan  5) Rituxan related ILD  6) significant new abdominal distention due to lymphoma recurrence  7) new hypercalcemia- ?  Related to dehydration, ?  Lymphoma recurrence or other etiologies.  PLAN: -Discussed with patient the labs and his clinical symptomatology in details. -Discussed with him the concerns for his acute abdominal change with possible etiologies of complete/partial small bowel obstruction or recurrence of his mantle cell lymphoma in his abdomen or other possible causes of acute abdomen. -CT scan results have been discussed with the patient which are concerning for lymphoma recurrence.  Recommend chemotherapy as soon as possible with CHOP.  Adverse effects of this regimen have been discussed with the patient including but not limited to alopecia, myelosuppression, nausea, vomiting, peripheral neuropathy, renal and liver dysfunction.  He agrees to proceed.  Discussed with the oncology unit and they anticipate chemotherapy administration on 10/30. -Continue prednisone 80 mg and PPI.  Monitor CBGs closely. -Continue allopurinol 300 mg daily due to risk for TLS.  Will check LDH and uric acid level tomorrow morning prior to chemotherapy. -Order transfer to the sixth floor in place. -Awaiting PICC line placement.  Order placed 10/29. -Echocardiogram completed this morning, results pending. -Hypercalcemia mildly improved this morning.  Treatment of lymphoma should help correct his hypercalcemia.  Will hold on Zometa at this point. -Hypercalcemia work-up -with PTH, PTH RP, myeloma panel, 125 hydroxy vitamin D, 25-hydroxy vitamin D, lymphoma work-up with CT chest. -If significant bowel distention on CT abdomen might need NG decompression.    LOS: 1 day   KMikey Bussing  DNP, AGPCNP-BC, AOCNP 04/20/20   ADDENDUM  .Patient was Personally and independently interviewed, examined and relevant elements of the history of  present illness were reviewed in details and an assessment and plan was created. All elements of the patient's history of present illness , assessment and plan were discussed in details with  Mikey Bussing, DNP, AGPCNP-BC, AOCNP. The above documentation reflects our combined findings assessment and plan. calcium improving 11 Some decrease in abdominal distension ECHO 65-70% Allopurinol for tls prophylaxis CHOP 1st dose on 10/30. No Rituxan due to previous Rituxan related ILD  Sullivan Lone MD MS

## 2020-04-20 NOTE — Progress Notes (Signed)
Peripherally Inserted Central Catheter Placement  The IV Nurse has discussed with the patient and/or persons authorized to consent for the patient, the purpose of this procedure and the potential benefits and risks involved with this procedure.  The benefits include less needle sticks, lab draws from the catheter, and the patient may be discharged home with the catheter. Risks include, but not limited to, infection, bleeding, blood clot (thrombus formation), and puncture of an artery; nerve damage and irregular heartbeat and possibility to perform a PICC exchange if needed/ordered by physician.  Alternatives to this procedure were also discussed.  Bard Power PICC patient education guide, fact sheet on infection prevention and patient information card has been provided to patient /or left at bedside.    PICC Placement Documentation  PICC Double Lumen 04/20/20 PICC Right Cephalic 42 cm 0 cm (Active)  Indication for Insertion or Continuance of Line Vasoactive infusions 04/20/20 1240  Exposed Catheter (cm) 0 cm 04/20/20 1240  Site Assessment Clean;Dry;Intact 04/20/20 1240  Lumen #1 Status Flushed;Saline locked;Blood return noted 04/20/20 1240  Lumen #2 Status Flushed;Saline locked;Blood return noted 04/20/20 1240  Dressing Type Transparent;Securing device 04/20/20 1240  Dressing Status Clean;Dry;Intact 04/20/20 1240  Antimicrobial disc in place? Yes 04/20/20 1240  Safety Lock Not Applicable 37/90/24 0973  Dressing Intervention New dressing 04/20/20 1240  Dressing Change Due 04/27/20 04/20/20 1240       Enos Fling 04/20/2020, 12:57 PM

## 2020-04-20 NOTE — Progress Notes (Signed)
Pt had 5 beats and another 6 beats of Vtach. Pt is asymptomatic. MD aware. Will keep monitoring.

## 2020-04-20 NOTE — Telephone Encounter (Signed)
Oral Oncology Patient Advocate Encounter  Received notification from [place] that prior authorization for Calquence is required.  PA submitted on CoverMyMeds Key [id number] Status is pending  Oral Oncology Clinic will continue to follow.  [place] [facility] [place] Phone [phone] Fax [phone] 04/20/2020 10:36 AM

## 2020-04-20 NOTE — Telephone Encounter (Signed)
Oral Oncology Pharmacist Encounter  Received new prescription for Calquence (acalabrutinib) for the treatment of mantle cell lymphoma, planned duration until disease progression or unacceptable drug toxicity.  Prescription dose and frequency assessed for appropriateness. Plan per Dr. Irene Limbo is anticipate initiation of Calquence within the next few weeks following cycle of CHOP.  CBC w/ Diff and CMP from 04/19/20 as well as CBC and BMP on 04/20/20 assessed, noted patient hypercalcemic (Ca 12.4 mg/dL, down to 11 mg/dL), K 3.4 mmol/L (patient being replaced with Kcl 20 mEq BID), and leukocytosis (WBC 14.1 K/uL). No dose adjustments required at this time.   Current medication list in Epic reviewed, DDIs with Calquence (acalabrutinib) identified:  Category X DDI between Calquence and pantoprazole - proton pump inhibitors can decrease serum concentrations of Calquence, thus decrease efficacy. Recommend discontinuation of PPI and switch to H2RA, like famotidine.   If famotidine is utilized, it will need to be separated from [place] administration. Calquence can be given either 2 hours before an H2RA or 10 hours after an H2RA.     Category C DDI between Calquence and Lovenox - anticoagulants can increase antiplatelet effect of Calquence. No change in therapy warranted at this time, but recommend monitoring for s/sx of bleeding.   Evaluated chart and no patient barriers to medication adherence noted.   Prescription has been e-scribed to the [facility] for benefits analysis and approval.  Oral Oncology Clinic will continue to follow for insurance authorization, copayment issues, initial counseling and start date.  Leron Croak, PharmD, BCPS Hematology/Oncology Clinical Pharmacist [facility] [phone] 04/20/2020 12:51 PM

## 2020-04-20 NOTE — Progress Notes (Signed)
PROGRESS NOTE    Dean Dixon  GEX:[id number] DOB: July 08, 1950 DOA: 04/19/2020 PCP: Leonides Sake, MD    Brief Narrative:  69 year old male with history of mantle cell lymphoma, provoked VTE, hypertension and diabetes brought to ER from cancer center where he presented with complaints of fatigue, decreased oral intake and he was found to have hypercalcemia 12.3.  Was also complaining of abdominal distention and constipation. In the emergency room, he was afebrile.  He was tachycardic and tachypneic.  He was started on 2 L oxygen.  Creatinine 1.13 with previous creatinine of 0.8.  Calcium 12.3.  Chest x-ray with atelectasis.  CT scan with new large 15 cm lymphoid mass within the left upper quadrant new from recent PET scan and plenty of retroperitoneal lymphadenopathy and precordial adenopathy.  Omental thickening.  Pelvic fluid.  Admitted to the hospital with significant symptoms and for inpatient chemotherapy.   Assessment & Plan:   Principal Problem:   Hypercalcemia Active Problems:   HTN (hypertension)   Mantle cell lymphoma of lymph nodes of multiple regions (HCC)   CAP (community acquired pneumonia)   Diabetes mellitus without complication ([place])   Acute respiratory failure with hypoxia (HCC)   Sepsis ([place])   Acute diastolic CHF (congestive heart failure) (HCC)  Hypercalcemia of malignancy: Also aggravated by dehydration. Treated with IV fluids with some improvement. Checking PTH, however, hypercalcemia is likely from lymphoma. Encourage oral nutrition and hydration.  SIRS: Present on admission.  Presented with tachycardia tachypnea leukocytosis, lactic acidosis and hypoxia.  No clear-cut source of infection. Unlikely pneumonia. Blood cultures are sent.  Patient is on Rocephin azithromycin.  His hypoxia is mostly related to atelectasis and abdominal distention. Chest physiotherapy, deep breathing exercises and mobility. We will continue Rocephin azithromycin for 48 hours  pending clinical improvement.  Mantle cell lymphoma with abdominal distention/metastatic disease: Patient with significant lymphadenopathy, lymphoid mass with incomplete bowel obstruction. Unable to get fluid pocket for paracentesis. Bowel functions present.  Will allow regular diet and monitor output and monitor bowel function. Receiving PICC line today.  Planning for inpatient chemotherapy with CHOP regimen.  Hypertension: Blood pressure stable on amlodipine and ramipril.  Type 2 diabetes: Stable on sliding scale.   DVT prophylaxis: enoxaparin (LOVENOX) injection 40 mg Start: 04/19/20 2200   Code Status: Full code Family Communication: None.  Patient is communicating with his friends. Disposition Plan: Status is: Inpatient  Remains inpatient appropriate because:IV treatments appropriate due to intensity of illness or inability to take PO and Inpatient level of care appropriate due to severity of illness   Dispo: The patient is from: Home              Anticipated d/c is to: Home              Anticipated d/c date is: > 3 days              Patient currently is not medically stable to d/c.         Consultants:   Oncology  Procedures:   None  Antimicrobials:   Rocephin and azithromycin, 10/28>>>   Subjective: Patient seen and examined.  Has some  distention of abdomen but denies any severe pain.  Denies any nausea vomiting.  He is eager to start on chemotherapy and see if he feels better.  Objective: Vitals:   04/19/20 1522 04/19/20 1527 04/19/20 2207 04/20/20 0518  BP: (!) 178/83 (!) 164/81 (!) 149/75 (!) 156/83  Pulse: (!) 109  94 88  Resp: Marland Kitchen)  21  (!) 21 20  Temp: 98 F (36.7 C)  98.4 F (36.9 C) 99.1 F (37.3 C)  TempSrc: Oral  Oral Oral  SpO2: 97%  93% 93%  Weight:      Height:        Intake/Output Summary (Last 24 hours) at 04/20/2020 1106 Last data filed at 04/20/2020 0300 Gross per 24 hour  Intake 1590 ml  Output 400 ml  Net 1190 ml    Filed Weights   04/19/20 1118  Weight: 95.3 kg    Examination:  General exam: Appears calm and comfortable  On room air today. Respiratory system: Clear to auscultation. Respiratory effort normal.  No added sounds. Cardiovascular system: S1 & S2 heard, RRR. No JVD, murmurs, rubs, gallops or clicks. No pedal edema. Gastrointestinal system: Abdomen soft, distended, bowel sounds hyperactive. Central nervous system: Alert and oriented. No focal neurological deficits. Extremities: Symmetric 5 x 5 power. Skin: No rashes, lesions or ulcers Psychiatry: Judgement and insight appear normal. Mood & affect appropriate.     Data Reviewed: I have personally reviewed following labs and imaging studies  CBC: Recent Labs  Lab 04/19/20 0922 04/19/20 1616 04/20/20 0348  WBC 11.3* 13.4* 14.1*  NEUTROABS 6.4 11.1*  --   HGB 12.2* 11.9* 10.8*  HCT 37.3* 37.0* 33.3*  MCV 85.7 87.3 88.1  PLT 197 203 500   Basic Metabolic Panel: Recent Labs  Lab 04/19/20 0922 04/19/20 1132 04/19/20 1505 04/19/20 1616 04/20/20 0348  NA 144 146*  --  146* 143  K 3.2* 3.2*  --  3.1* 3.4*  CL 102 102  --  102 101  CO2 32 30  --  26 26  GLUCOSE 169* 186*  --  218* 211*  BUN 9 11  --  12 16  CREATININE 1.13 1.05  --  1.02 1.04  CALCIUM 12.3* 12.4*  --  12.4* 11.0*  MG  --   --  2.2  --   --    GFR: Estimated Creatinine Clearance: 68.8 mL/min (by C-G formula based on SCr of 1.04 mg/dL). Liver Function Tests: Recent Labs  Lab 04/19/20 0922 04/19/20 1132 04/19/20 1616  AST 16 19 16   ALT 10 12 12   ALKPHOS 89 82 72  BILITOT 0.7 0.9 1.0  PROT 6.5 7.2 6.5  ALBUMIN 4.0 4.4 4.0   No results for input(s): LIPASE, AMYLASE in the last 168 hours. No results for input(s): AMMONIA in the last 168 hours. Coagulation Profile: No results for input(s): INR, PROTIME in the last 168 hours. Cardiac Enzymes: No results for input(s): CKTOTAL, CKMB, CKMBINDEX, TROPONINI in the last 168 hours. BNP (last 3  results) No results for input(s): PROBNP in the last 8760 hours. HbA1C: No results for input(s): HGBA1C in the last 72 hours. CBG: Recent Labs  Lab 04/19/20 1719 04/19/20 2203 04/20/20 0755  GLUCAP 207* 187* 178*   Lipid Profile: No results for input(s): CHOL, HDL, LDLCALC, TRIG, CHOLHDL, LDLDIRECT in the last 72 hours. Thyroid Function Tests: No results for input(s): TSH, T4TOTAL, FREET4, T3FREE, THYROIDAB in the last 72 hours. Anemia Panel: No results for input(s): VITAMINB12, FOLATE, FERRITIN, TIBC, IRON, RETICCTPCT in the last 72 hours. Sepsis Labs: Recent Labs  Lab 04/19/20 1132 04/19/20 1345 04/19/20 1624 04/19/20 2040  LATICACIDVEN 2.3* 2.7* 1.9 1.6    Recent Results (from the past 240 hour(s))  Respiratory Panel by RT PCR (Flu A&B, Covid) - Nasopharyngeal Swab     Status: None   Collection Time: 04/19/20 11:53  AM   Specimen: Nasopharyngeal Swab  Result Value Ref Range Status   SARS Coronavirus 2 by RT PCR NEGATIVE NEGATIVE Final    Comment: (NOTE) SARS-CoV-2 target nucleic acids are NOT DETECTED.  The SARS-CoV-2 RNA is generally detectable in upper respiratoy specimens during the acute phase of infection. The lowest concentration of SARS-CoV-2 viral copies this assay can detect is 131 copies/mL. A negative result does not preclude SARS-Cov-2 infection and should not be used as the sole basis for treatment or other patient management decisions. A negative result may occur with  improper specimen collection/handling, submission of specimen other than nasopharyngeal swab, presence of viral mutation(s) within the areas targeted by this assay, and inadequate number of viral copies (<131 copies/mL). A negative result must be combined with clinical observations, patient history, and epidemiological information. The expected result is Negative.  Fact Sheet for Patients:  PinkCheek.be  Fact Sheet for Healthcare Providers:   GravelBags.it  This test is no t yet approved or cleared by the [place] FDA and  has been authorized for detection and/or diagnosis of SARS-CoV-2 by FDA under an Emergency Use Authorization (EUA). This EUA will remain  in effect (meaning this test can be used) for the duration of the COVID-19 declaration under Section 564(b)(1) of the Act, 21 U.S.C. section 360bbb-3(b)(1), unless the authorization is terminated or revoked sooner.     Influenza A by PCR NEGATIVE NEGATIVE Final   Influenza B by PCR NEGATIVE NEGATIVE Final    Comment: (NOTE) The Xpert Xpress SARS-CoV-2/FLU/RSV assay is intended as an aid in  the diagnosis of influenza from Nasopharyngeal swab specimens and  should not be used as a sole basis for treatment. Nasal washings and  aspirates are unacceptable for Xpert Xpress SARS-CoV-2/FLU/RSV  testing.  Fact Sheet for Patients: PinkCheek.be  Fact Sheet for Healthcare Providers: GravelBags.it  This test is not yet approved or cleared by the [place] FDA and  has been authorized for detection and/or diagnosis of SARS-CoV-2 by  FDA under an Emergency Use Authorization (EUA). This EUA will remain  in effect (meaning this test can be used) for the duration of the  Covid-19 declaration under Section 564(b)(1) of the Act, 21  U.S.C. section 360bbb-3(b)(1), unless the authorization is  terminated or revoked. Performed at [facility], [address] [place], [place] [address]          Radiology Studies: CT ABDOMEN PELVIS WO CONTRAST  Addendum Date: 04/19/2020   ADDENDUM REPORT: 04/19/2020 12:54 ADDENDUM: The peritoneal fluid within the pelvis is not enhancing as this is a non IV contrast imaging exam. Therefore would described fluid collection as having thickened rim. Favor peritoneal metastasis. Findings conveyed toSCOTT GOLDSTON on 04/19/2020  at12:54.  Electronically Signed   By: Suzy Bouchard M.D.   On: 04/19/2020 12:54   Result Date: 04/19/2020 CLINICAL DATA:  Abdominal pain.  Tachycardia.  Mantle cell lymphoma EXAM: CT ABDOMEN AND PELVIS WITHOUT CONTRAST TECHNIQUE: Multidetector CT imaging of the abdomen and pelvis was performed following the standard protocol without IV contrast. COMPARISON:  PET-CT scan 01/02/2020 FINDINGS: Lower chest: New small RIGHT effusion. No pericardial effusion. There is new precordial adenopathy anterior to the RIGHT heart and liver (image 9/2). Hepatobiliary: New intraperitoneal free fluid along the margin of the RIGHT hepatic lobe. This fluid is simple attenuation. The liver appears unchanged on noncontrast exam. Large cyst the LEFT hepatic lobe. Gallbladder normal. Pancreas: Pancreas is grossly normal noncontrast exam Spleen: . spleen is unchanged Adrenals/urinary tract:  Adrenal glands kidneys and ureters normal. Bladder Stomach/Bowel: Stomach duodenum normal. There is a new mass lesion in the LEFT upper quadrant measuring 14.2 by 13.0 cm. The mass may be associated the small bowel or the small bowel mesentery. The descending colon is also involving the mass (image 43/2. There is extensive fluid within the leaves of the mesentery of the distal small bowel. Appendix normal. Ascending transverse colon normal. The descending colon tracks along the new LEFT upper quadrant mass. Vascular/Lymphatic: New large LEFT upper quadrant mass either represents lymphoma involving the small bowel or mesentery. Mass is very large described the stomach section. Additionally there is new periportal adenopathy with enlarged lymph nodes along the aorta measuring up to 2 cm (image 44/2. There is nodularity along the greater omentum abdominal on the LEFT. Reproductive: Prostate unremarkable Other: Small volume free fluid the pelvis. There is enhancing rim to the fluid (image 73/2 which could indicate peritonitis. Musculoskeletal: No aggressive  osseous lesion. IMPRESSION: 1. Dominant finding is new large (15 cm) lymphoid mass within LEFT upper quadrant. Mass is new from PET-CT 01/02/2020. Differential includes massive mesenteric adenopathy versus lymphoma of the small bowel or descending colon. No oral or IV contrast. 2. New periaortic retroperitoneal lymphadenopathy. New precordial adenopathy. 3. Extensive fluid within the mesentery and omental thickening in LEFT lower quadrant. 4. Free fluid the pelvis with thin enhancing rim suggest peritonitis. 5. No evidence of bowel perforation. No evidence of bowel obstruction. Electronically Signed: By: Suzy Bouchard M.D. On: 04/19/2020 12:39   CT CHEST WO CONTRAST  Result Date: 04/19/2020 CLINICAL DATA:  Pneumonia. Pleural effusion. Concern for metastases. EXAM: CT CHEST WITHOUT CONTRAST TECHNIQUE: Multidetector CT imaging of the chest was performed following the standard protocol without IV contrast. COMPARISON:  CT dated Nov 14, 2019 FINDINGS: Cardiovascular: The heart size is stable. Coronary artery calcifications are noted. There is no significant pericardial effusion. Mediastinum/Nodes: -- No mediastinal lymphadenopathy. -- No hilar lymphadenopathy. --there is left axillary adenopathy. This has progressed since the prior study. There is mild right axillary adenopathy, progressed from prior study. -- No supraclavicular lymphadenopathy. -- Normal thyroid gland where visualized. -there is mild diffuse esophageal wall thickening. Lungs/Pleura: There are trace to small bilateral pleural effusions, right greater than left. There is atelectasis at the right lung base. There is no pneumothorax. No area of consolidation concerning for pneumonia. Upper Abdomen: There is a partially visualized mass in the left upper quadrant, better visualized on the patient's prior CT. There is free fluid in the upper abdomen. Multiple enlarged pericardiophrenic lymph nodes are noted. There appears to be retroperitoneal  adenopathy. Musculoskeletal: No chest wall abnormality. No bony spinal canal stenosis. IMPRESSION: 1. Trace to small bilateral pleural effusions, right greater than left. There is atelectasis at the right lung base. No area of consolidation concerning for pneumonia. 2. Mild diffuse esophageal wall thickening. Correlation with patient's symptoms is recommended. 3. Worsening axillary adenopathy. 4. Partially visualized mass in the left upper quadrant, better visualized on the patient's prior CT. 5. Small volume ascites. Aortic Atherosclerosis (ICD10-I70.0). Electronically Signed   By: Constance Holster M.D.   On: 04/19/2020 20:37   DG Chest Portable 1 View  Result Date: 04/19/2020 CLINICAL DATA:  Cough, wheezing EXAM: PORTABLE CHEST 1 VIEW COMPARISON:  01/09/2020 FINDINGS: Low lung volumes. Minimal atelectasis or scarring at the left lung base. No pleural effusion or pneumothorax. Heart size is within normal limits for technique. IMPRESSION: Minimal atelectasis or scarring at the left lung base. Electronically Signed  By: Macy Mis M.D.   On: 04/19/2020 12:18   ECHOCARDIOGRAM COMPLETE  Result Date: 04/20/2020    ECHOCARDIOGRAM REPORT   Patient Name:   JHOAN SCHMIEDER Date of Exam: 04/20/2020 Medical Rec #:  [id number]    Height:       64.0 in Accession #:    [id number]   Weight:       210.1 lb Date of Birth:  03/21/1951   BSA:          1.998 m Patient Age:    71 years     BP:           156/83 mmHg Patient Gender: M            HR:           88 bpm. Exam Location:  Inpatient Procedure: 2D Echo, Cardiac Doppler and Color Doppler Indications:    Chemo evaluation  History:        Patient has prior history of Echocardiogram examinations, most                 recent 11/14/2019. COPD; Risk Factors:Hypertension and Diabetes.                 Mantle cell lymphoma.  Sonographer:    Dustin Flock Referring Phys: [place]  1. Left ventricular ejection fraction, by estimation, is 65 to 70%.  The left ventricle has normal function. The left ventricle has no regional wall motion abnormalities. Left ventricular diastolic parameters are consistent with Grade I diastolic dysfunction (impaired relaxation).  2. Right ventricular systolic function is normal. The right ventricular size is normal. Tricuspid regurgitation signal is inadequate for assessing PA pressure.  3. The mitral valve is grossly normal. Trivial mitral valve regurgitation. No evidence of mitral stenosis.  4. The aortic valve is tricuspid. There is mild calcification of the aortic valve. There is mild thickening of the aortic valve. Aortic valve regurgitation is not visualized. Mild aortic valve sclerosis is present, with no evidence of aortic valve stenosis.  5. The inferior vena cava is normal in size with greater than 50% respiratory variability, suggesting right atrial pressure of 3 mmHg. Comparison(s): Changes from prior study are noted. EF is now 65-70%. FINDINGS  Left Ventricle: Left ventricular ejection fraction, by estimation, is 65 to 70%. The left ventricle has normal function. The left ventricle has no regional wall motion abnormalities. The left ventricular internal cavity size was normal in size. There is  no left ventricular hypertrophy. Left ventricular diastolic parameters are consistent with Grade I diastolic dysfunction (impaired relaxation). Normal left ventricular filling pressure. Right Ventricle: The right ventricular size is normal. No increase in right ventricular wall thickness. Right ventricular systolic function is normal. Tricuspid regurgitation signal is inadequate for assessing PA pressure. Left Atrium: Left atrial size was normal in size. Right Atrium: Right atrial size was normal in size. Pericardium: Trivial pericardial effusion is present. Presence of pericardial fat pad. Mitral Valve: The mitral valve is grossly normal. Trivial mitral valve regurgitation. No evidence of mitral valve stenosis. Tricuspid Valve:  The tricuspid valve is grossly normal. Tricuspid valve regurgitation is not demonstrated. No evidence of tricuspid stenosis. Aortic Valve: The aortic valve is tricuspid. There is mild calcification of the aortic valve. There is mild thickening of the aortic valve. Aortic valve regurgitation is not visualized. Mild aortic valve sclerosis is present, with no evidence of aortic valve stenosis. Aortic valve mean gradient measures 11.1 mmHg. Aortic valve peak  gradient measures 16.7 mmHg. Aortic valve area, by VTI measures 2.42 cm. Pulmonic Valve: The pulmonic valve was grossly normal. Pulmonic valve regurgitation is not visualized. No evidence of pulmonic stenosis. Aorta: The aortic root is normal in size and structure. Venous: The right upper pulmonary vein is normal. The inferior vena cava is normal in size with greater than 50% respiratory variability, suggesting right atrial pressure of 3 mmHg. IAS/Shunts: The atrial septum is grossly normal. Additional Comments: There is a small pleural effusion in the left lateral region.  LEFT VENTRICLE PLAX 2D LVIDd:         4.30 cm  Diastology LVIDs:         2.60 cm  LV e' medial:    8.81 cm/s LV PW:         1.10 cm  LV E/e' medial:  7.2 LV IVS:        1.10 cm  LV e' lateral:   7.51 cm/s LVOT diam:     2.30 cm  LV E/e' lateral: 8.4 LV SV:         92 LV SV Index:   46 LVOT Area:     4.15 cm  RIGHT VENTRICLE RV Basal diam:  3.20 cm RV S prime:     11.00 cm/s TAPSE (M-mode): 2.9 cm LEFT ATRIUM             Index       RIGHT ATRIUM           Index LA diam:        3.30 cm 1.65 cm/m  RA Area:     15.30 cm LA Vol (A2C):   43.2 ml 21.62 ml/m RA Volume:   37.60 ml  18.82 ml/m LA Vol (A4C):   30.7 ml 15.37 ml/m LA Biplane Vol: 36.9 ml 18.47 ml/m  AORTIC VALVE AV Area (Vmax):    2.42 cm AV Area (Vmean):   2.14 cm AV Area (VTI):     2.42 cm AV Vmax:           204.20 cm/s AV Vmean:          159.222 cm/s AV VTI:            0.381 m AV Peak Grad:      16.7 mmHg AV Mean Grad:      11.1  mmHg LVOT Vmax:         119.00 cm/s LVOT Vmean:        82.200 cm/s LVOT VTI:          0.222 m LVOT/AV VTI ratio: 0.58  AORTA Ao Root diam: 3.10 cm MITRAL VALVE MV Area (PHT): 4.06 cm     SHUNTS MV Decel Time: 187 msec     Systemic VTI:  0.22 m MV E velocity: 63.40 cm/s   Systemic Diam: 2.30 cm MV A velocity: 104.00 cm/s MV E/A ratio:  0.61 Eleonore Chiquito MD Electronically signed by Eleonore Chiquito MD Signature Date/Time: 04/20/2020/10:32:25 AM    Final    [place] ASCITES (ABDOMEN LIMITED)  Result Date: 04/19/2020 CLINICAL DATA:  69 year old male with abdominal distension, ascites EXAM: LIMITED ABDOMEN ULTRASOUND FOR ASCITES TECHNIQUE: Limited ultrasound survey for ascites was performed in all four abdominal quadrants. COMPARISON:  CT abdomen/pelvis 04/19/2020 FINDINGS: Sonographic interrogation of the abdomen was performed in anticipation of paracentesis. Unfortunately, the volume is a ascites is quite small and insufficient for drainage. There is however extensive omental caking and peritoneal nodularity. IMPRESSION: 1. Small volume ascites, insufficient  for paracentesis. 2. Extensive omental caking and peritoneal nodularity consistent with peritoneal carcinomatosis. Electronically Signed   By: Jacqulynn Cadet M.D.   On: 04/19/2020 16:53   [place] EKG SITE RITE  Result Date: 04/19/2020 If Site Rite image not attached, placement could not be confirmed due to current cardiac rhythm.       Scheduled Meds: . allopurinol  300 mg Oral Daily  . amLODipine  10 mg Oral Daily  . enoxaparin (LOVENOX) injection  40 mg Subcutaneous Q24H  . insulin aspart  0-9 Units Subcutaneous TID WC  . pantoprazole  40 mg Oral Daily  . potassium chloride  20 mEq Oral BID  . predniSONE  80 mg Oral Q breakfast  . ramipril  10 mg Oral Daily  . sodium chloride flush  3 mL Intravenous Q12H   Continuous Infusions: . azithromycin 500 mg (04/19/20 1711)  . cefTRIAXone (ROCEPHIN)  IV 2 g (04/19/20 1636)     LOS: 1 day    Time  spent: 30 minutes    Barb Merino, MD [facility] Pager [phone]

## 2020-04-21 DIAGNOSIS — Z5111 Encounter for antineoplastic chemotherapy: Secondary | ICD-10-CM

## 2020-04-21 DIAGNOSIS — C8318 Mantle cell lymphoma, lymph nodes of multiple sites: Secondary | ICD-10-CM | POA: Diagnosis not present

## 2020-04-21 LAB — URIC ACID: Uric Acid, Serum: 8.5 mg/dL (ref 3.7–8.6)

## 2020-04-21 LAB — GLUCOSE, CAPILLARY
Glucose-Capillary: 141 mg/dL — ABNORMAL HIGH (ref 70–99)
Glucose-Capillary: 196 mg/dL — ABNORMAL HIGH (ref 70–99)
Glucose-Capillary: 207 mg/dL — ABNORMAL HIGH (ref 70–99)
Glucose-Capillary: 288 mg/dL — ABNORMAL HIGH (ref 70–99)

## 2020-04-21 LAB — COMPREHENSIVE METABOLIC PANEL
ALT: 12 U/L (ref 0–44)
AST: 17 U/L (ref 15–41)
Albumin: 3.6 g/dL (ref 3.5–5.0)
Alkaline Phosphatase: 69 U/L (ref 38–126)
Anion gap: 12 (ref 5–15)
BUN: 24 mg/dL — ABNORMAL HIGH (ref 8–23)
CO2: 28 mmol/L (ref 22–32)
Calcium: 9.9 mg/dL (ref 8.9–10.3)
Chloride: 104 mmol/L (ref 98–111)
Creatinine, Ser: 1.07 mg/dL (ref 0.61–1.24)
GFR, Estimated: >60 mL/min (ref >60)
Glucose, Bld: 191 mg/dL — ABNORMAL HIGH (ref 70–99)
Potassium: 3.3 mmol/L — ABNORMAL LOW (ref 3.5–5.1)
Sodium: 144 mmol/L (ref 135–145)
Total Bilirubin: 0.6 mg/dL (ref 0.3–1.2)
Total Protein: 6 g/dL — ABNORMAL LOW (ref 6.5–8.1)

## 2020-04-21 LAB — CBC WITH DIFFERENTIAL/PLATELET
Abs Immature Granulocytes: 0.43 10*3/uL — ABNORMAL HIGH (ref 0.00–0.07)
Basophils Absolute: 0 10*3/uL (ref 0.0–0.1)
Basophils Relative: 0 %
Eosinophils Absolute: 0 10*3/uL (ref 0.0–0.5)
Eosinophils Relative: 0 %
HCT: 32.1 % — ABNORMAL LOW (ref 39.0–52.0)
Hemoglobin: 10.1 g/dL — ABNORMAL LOW (ref 13.0–17.0)
Immature Granulocytes: 3 %
Lymphocytes Relative: 21 %
Lymphs Abs: 3.1 10*3/uL (ref 0.7–4.0)
MCH: 28.4 pg (ref 26.0–34.0)
MCHC: 31.5 g/dL (ref 30.0–36.0)
MCV: 90.2 fL (ref 80.0–100.0)
Monocytes Absolute: 1.1 10*3/uL — ABNORMAL HIGH (ref 0.1–1.0)
Monocytes Relative: 8 %
Neutro Abs: 10.3 10*3/uL — ABNORMAL HIGH (ref 1.7–7.7)
Neutrophils Relative %: 68 %
Platelets: 182 10*3/uL (ref 150–400)
RBC: 3.56 MIL/uL — ABNORMAL LOW (ref 4.22–5.81)
RDW: 17.7 % — ABNORMAL HIGH (ref 11.5–15.5)
WBC: 14.9 10*3/uL — ABNORMAL HIGH (ref 4.0–10.5)
nRBC: 0.3 % — ABNORMAL HIGH (ref 0.0–0.2)

## 2020-04-21 LAB — MAGNESIUM: Magnesium: 2.1 mg/dL (ref 1.7–2.4)

## 2020-04-21 LAB — PHOSPHORUS: Phosphorus: 2.4 mg/dL — ABNORMAL LOW (ref 2.5–4.6)

## 2020-04-21 LAB — PARATHYROID HORMONE, INTACT (NO CA): PTH: 10 pg/mL — ABNORMAL LOW (ref 15–65)

## 2020-04-21 LAB — LACTATE DEHYDROGENASE: LDH: 241 U/L — ABNORMAL HIGH (ref 98–192)

## 2020-04-21 MED ORDER — GLUCERNA SHAKE PO LIQD
237.0000 mL | Freq: Three times a day (TID) | ORAL | Status: DC
  Administered 2020-04-21 – 2020-04-23 (×2): 237 mL via ORAL
  Filled 2020-04-21 (×8): qty 237

## 2020-04-21 MED ORDER — POTASSIUM CHLORIDE CRYS ER 20 MEQ PO TBCR
20.0000 meq | EXTENDED_RELEASE_TABLET | Freq: Two times a day (BID) | ORAL | Status: AC
  Administered 2020-04-21 – 2020-04-22 (×4): 20 meq via ORAL
  Filled 2020-04-21 (×4): qty 1

## 2020-04-21 NOTE — Progress Notes (Addendum)
HEMATOLOGY-ONCOLOGY PROGRESS NOTE  SUBJECTIVE:   Patient notes no acute issues overnight. No shortness or chestpain. Has BM this AM. Some improvement in po intake.Starting CHOP C1 today. Answered additional questions regarding treatment approach and plan. Labs reviewed- calcium normalized.    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 ([place])   06/29/2019 - 10/20/2019 Chemotherapy   The patient had dexamethasone (DECADRON) 4 MG tablet, 8 mg, Oral, Daily, 1 of 1 cycle, Start date: 06/20/2019, End date: 11/17/2019 palonosetron (ALOXI) injection 0.25 mg, 0.25 mg, Intravenous,  Once, 5 of 5 cycles Administration: 0.25 mg (07/27/2019), 0.25 mg (06/30/2019), 0.25 mg (08/24/2019), 0.25 mg (10/20/2019), 0.25 mg (09/21/2019) pegfilgrastim (NEULASTA ONPRO KIT) injection 6 mg, 6 mg, Subcutaneous, Once, 3 of 3 cycles Administration: 6 mg (08/25/2019), 6 mg (09/22/2019) bendamustine (BENDEKA) 200 mg in sodium chloride 0.9 % 50 mL (3.4483 mg/mL) chemo infusion, 90 mg/m2 = 200 mg, Intravenous,  Once, 5 of 5 cycles Administration: 200 mg (06/29/2019), 200 mg (06/30/2019), 200 mg (07/27/2019), 200 mg (07/28/2019), 200 mg (08/24/2019), 200 mg (08/25/2019), 200 mg (09/21/2019), 200 mg (09/22/2019) riTUXimab-pvvr (RUXIENCE) 800 mg in sodium chloride 0.9 % 250 mL (2.4242 mg/mL) infusion, 375 mg/m2 = 800 mg, Intravenous,  Once, 5 of 5 cycles Dose modification: 375 mg/m2 (original dose 375 mg/m2, Cycle 2), 400 mg (original dose 375 mg/m2, Cycle 2, Reason: Other (see comments), Comment: finishing rituximab from previous day, infusion not completed), 300 mg (original dose 300 mg, Cycle 4) Administration: 800 mg (06/29/2019), 800 mg (07/27/2019), 800 mg (08/24/2019), 400 mg (07/28/2019), 800 mg (10/20/2019), 800 mg (09/21/2019), 300 mg (09/22/2019)  for chemotherapy treatment.    04/20/2020 -  Chemotherapy   The patient had DOXOrubicin  (ADRIAMYCIN) chemo injection 104 mg, 50 mg/m2 = 104 mg, Intravenous,  Once, 1 of 8 cycles palonosetron (ALOXI) injection 0.25 mg, 0.25 mg, Intravenous,  Once, 1 of 8 cycles vinCRIStine (ONCOVIN) 2 mg in sodium chloride 0.9 % 50 mL chemo infusion, 2 mg, Intravenous,  Once, 1 of 8 cycles cyclophosphamide (CYTOXAN) 1,560 mg in sodium chloride 0.9 % 250 mL chemo infusion, 750 mg/m2 = 1,560 mg, Intravenous,  Once, 1 of 8 cycles fosaprepitant (EMEND) 150 mg in sodium chloride 0.9 % 145 mL IVPB, 150 mg, Intravenous,  Once, 1 of 8 cycles  for chemotherapy treatment.       REVIEW OF SYSTEMS:   ROS - no fevers/chills no SOB  PHYSICAL EXAMINATION: ECOG PERFORMANCE STATUS: 2 - Symptomatic, <50% confined to bed  Vitals:   04/21/20 0336 04/21/20 0729  BP: (!) 160/87 (!) 165/84  Pulse: 88 87  Resp: 14   Temp: 97.6 F (36.4 C) 97.8 F (36.6 C)  SpO2: 91% 93%   Filed Weights   04/19/20 1118 04/20/20 1936 04/21/20 0715  Weight: 210 lb 1.6 oz (95.3 kg) 205 lb 7.5 oz (93.2 kg) 211 lb 3.2 oz (95.8 kg)    Intake/Output from previous day: 10/29 0701 - 10/30 0700 In: 720 [P.O.:720] Out: 200 [Urine:200]  GENERAL:alert, no distress and comfortable SKIN: skin color, texture, turgor are normal, no rashes or significant lesions EYES: normal, Conjunctiva are pink and non-injected, sclera clear OROPHARYNX:no exudate, no erythema and lips, buccal mucosa, and tongue normal  LUNGS: clear to auscultation and percussion with normal breathing effort HEART: regular rate & rhythm and no murmurs and no lower extremity edema ABDOMEN: Significant abdominal distention Musculoskeletal:no cyanosis of  digits and no clubbing  NEURO: alert & oriented x 3 with fluent speech, no focal motor/sensory deficits  LABORATORY DATA:  I have reviewed the data as listed CMP Latest Ref Rng & Units 04/21/2020 04/20/2020 04/19/2020  Glucose 70 - 99 mg/dL 191(H) 211(H) 218(H)  BUN 8 - 23 mg/dL 24(H) 16 12  Creatinine 0.61 - 1.24  mg/dL 1.07 1.04 1.02  Sodium 135 - 145 mmol/L 144 143 146(H)  Potassium 3.5 - 5.1 mmol/L 3.3(L) 3.4(L) 3.1(L)  Chloride 98 - 111 mmol/L 104 101 102  CO2 22 - 32 mmol/L $RemoveB'28 26 26  'DdmULlUv$ Calcium 8.9 - 10.3 mg/dL 9.9 11.0(H) 12.4(H)  Total Protein 6.5 - 8.1 g/dL 6.0(L) - 6.5  Total Bilirubin 0.3 - 1.2 mg/dL 0.6 - 1.0  Alkaline Phos 38 - 126 U/L 69 - 72  AST 15 - 41 U/L 17 - 16  ALT 0 - 44 U/L 12 - 12   . CBC Latest Ref Rng & Units 04/21/2020 04/20/2020 04/19/2020  WBC 4.0 - 10.5 K/uL 14.9(H) 14.1(H) 13.4(H)  Hemoglobin 13.0 - 17.0 g/dL 10.1(L) 10.8(L) 11.9(L)  Hematocrit 39 - 52 % 32.1(L) 33.3(L) 37.0(L)  Platelets 150 - 400 K/uL 182 171 203    Lab Results  Component Value Date   WBC 14.9 (H) 04/21/2020   HGB 10.1 (L) 04/21/2020   HCT 32.1 (L) 04/21/2020   MCV 90.2 04/21/2020   PLT 182 04/21/2020   NEUTROABS 10.3 (H) 04/21/2020    CT ABDOMEN PELVIS WO CONTRAST  Addendum Date: 04/19/2020   ADDENDUM REPORT: 04/19/2020 12:54 ADDENDUM: The peritoneal fluid within the pelvis is not enhancing as this is a non IV contrast imaging exam. Therefore would described fluid collection as having thickened rim. Favor peritoneal metastasis. Findings conveyed toSCOTT GOLDSTON on 04/19/2020  at12:54. Electronically Signed   By: Suzy Bouchard M.D.   On: 04/19/2020 12:54   Result Date: 04/19/2020 CLINICAL DATA:  Abdominal pain.  Tachycardia.  Mantle cell lymphoma EXAM: CT ABDOMEN AND PELVIS WITHOUT CONTRAST TECHNIQUE: Multidetector CT imaging of the abdomen and pelvis was performed following the standard protocol without IV contrast. COMPARISON:  PET-CT scan 01/02/2020 FINDINGS: Lower chest: New small RIGHT effusion. No pericardial effusion. There is new precordial adenopathy anterior to the RIGHT heart and liver (image 9/2). Hepatobiliary: New intraperitoneal free fluid along the margin of the RIGHT hepatic lobe. This fluid is simple attenuation. The liver appears unchanged on noncontrast exam. Large cyst  the LEFT hepatic lobe. Gallbladder normal. Pancreas: Pancreas is grossly normal noncontrast exam Spleen: . spleen is unchanged Adrenals/urinary tract: Adrenal glands kidneys and ureters normal. Bladder Stomach/Bowel: Stomach duodenum normal. There is a new mass lesion in the LEFT upper quadrant measuring 14.2 by 13.0 cm. The mass may be associated the small bowel or the small bowel mesentery. The descending colon is also involving the mass (image 43/2. There is extensive fluid within the leaves of the mesentery of the distal small bowel. Appendix normal. Ascending transverse colon normal. The descending colon tracks along the new LEFT upper quadrant mass. Vascular/Lymphatic: New large LEFT upper quadrant mass either represents lymphoma involving the small bowel or mesentery. Mass is very large described the stomach section. Additionally there is new periportal adenopathy with enlarged lymph nodes along the aorta measuring up to 2 cm (image 44/2. There is nodularity along the greater omentum abdominal on the LEFT. Reproductive: Prostate unremarkable Other: Small volume free fluid the pelvis. There is enhancing rim to the fluid (image 73/2 which could indicate peritonitis. Musculoskeletal:  No aggressive osseous lesion. IMPRESSION: 1. Dominant finding is new large (15 cm) lymphoid mass within LEFT upper quadrant. Mass is new from PET-CT 01/02/2020. Differential includes massive mesenteric adenopathy versus lymphoma of the small bowel or descending colon. No oral or IV contrast. 2. New periaortic retroperitoneal lymphadenopathy. New precordial adenopathy. 3. Extensive fluid within the mesentery and omental thickening in LEFT lower quadrant. 4. Free fluid the pelvis with thin enhancing rim suggest peritonitis. 5. No evidence of bowel perforation. No evidence of bowel obstruction. Electronically Signed: By: Suzy Bouchard M.D. On: 04/19/2020 12:39   CT CHEST WO CONTRAST  Result Date: 04/19/2020 CLINICAL DATA:   Pneumonia. Pleural effusion. Concern for metastases. EXAM: CT CHEST WITHOUT CONTRAST TECHNIQUE: Multidetector CT imaging of the chest was performed following the standard protocol without IV contrast. COMPARISON:  CT dated Nov 14, 2019 FINDINGS: Cardiovascular: The heart size is stable. Coronary artery calcifications are noted. There is no significant pericardial effusion. Mediastinum/Nodes: -- No mediastinal lymphadenopathy. -- No hilar lymphadenopathy. --there is left axillary adenopathy. This has progressed since the prior study. There is mild right axillary adenopathy, progressed from prior study. -- No supraclavicular lymphadenopathy. -- Normal thyroid gland where visualized. -there is mild diffuse esophageal wall thickening. Lungs/Pleura: There are trace to small bilateral pleural effusions, right greater than left. There is atelectasis at the right lung base. There is no pneumothorax. No area of consolidation concerning for pneumonia. Upper Abdomen: There is a partially visualized mass in the left upper quadrant, better visualized on the patient's prior CT. There is free fluid in the upper abdomen. Multiple enlarged pericardiophrenic lymph nodes are noted. There appears to be retroperitoneal adenopathy. Musculoskeletal: No chest wall abnormality. No bony spinal canal stenosis. IMPRESSION: 1. Trace to small bilateral pleural effusions, right greater than left. There is atelectasis at the right lung base. No area of consolidation concerning for pneumonia. 2. Mild diffuse esophageal wall thickening. Correlation with patient's symptoms is recommended. 3. Worsening axillary adenopathy. 4. Partially visualized mass in the left upper quadrant, better visualized on the patient's prior CT. 5. Small volume ascites. Aortic Atherosclerosis (ICD10-I70.0). Electronically Signed   By: Constance Holster M.D.   On: 04/19/2020 20:37   DG Chest Portable 1 View  Result Date: 04/19/2020 CLINICAL DATA:  Cough, wheezing EXAM:  PORTABLE CHEST 1 VIEW COMPARISON:  01/09/2020 FINDINGS: Low lung volumes. Minimal atelectasis or scarring at the left lung base. No pleural effusion or pneumothorax. Heart size is within normal limits for technique. IMPRESSION: Minimal atelectasis or scarring at the left lung base. Electronically Signed   By: Macy Mis M.D.   On: 04/19/2020 12:18   ECHOCARDIOGRAM COMPLETE  Result Date: 04/20/2020    ECHOCARDIOGRAM REPORT   Patient Name:   WAYMOND MEADOR Date of Exam: 04/20/2020 Medical Rec #:  [id number]    Height:       64.0 in Accession #:    [id number]   Weight:       210.1 lb Date of Birth:  [birth year]   BSA:          1.998 m Patient Age:    32 years     BP:           156/83 mmHg Patient Gender: M            HR:           88 bpm. Exam Location:  Inpatient Procedure: 2D Echo, Cardiac Doppler and Color Doppler Indications:    Chemo evaluation  History:  Patient has prior history of Echocardiogram examinations, most                 recent 11/14/2019. COPD; Risk Factors:Hypertension and Diabetes.                 Mantle cell lymphoma.  Sonographer:    Dustin Flock Referring Phys: [place]  1. Left ventricular ejection fraction, by estimation, is 65 to 70%. The left ventricle has normal function. The left ventricle has no regional wall motion abnormalities. Left ventricular diastolic parameters are consistent with Grade I diastolic dysfunction (impaired relaxation).  2. Right ventricular systolic function is normal. The right ventricular size is normal. Tricuspid regurgitation signal is inadequate for assessing PA pressure.  3. The mitral valve is grossly normal. Trivial mitral valve regurgitation. No evidence of mitral stenosis.  4. The aortic valve is tricuspid. There is mild calcification of the aortic valve. There is mild thickening of the aortic valve. Aortic valve regurgitation is not visualized. Mild aortic valve sclerosis is present, with no evidence of aortic valve  stenosis.  5. The inferior vena cava is normal in size with greater than 50% respiratory variability, suggesting right atrial pressure of 3 mmHg. Comparison(s): Changes from prior study are noted. EF is now 65-70%. FINDINGS  Left Ventricle: Left ventricular ejection fraction, by estimation, is 65 to 70%. The left ventricle has normal function. The left ventricle has no regional wall motion abnormalities. The left ventricular internal cavity size was normal in size. There is  no left ventricular hypertrophy. Left ventricular diastolic parameters are consistent with Grade I diastolic dysfunction (impaired relaxation). Normal left ventricular filling pressure. Right Ventricle: The right ventricular size is normal. No increase in right ventricular wall thickness. Right ventricular systolic function is normal. Tricuspid regurgitation signal is inadequate for assessing PA pressure. Left Atrium: Left atrial size was normal in size. Right Atrium: Right atrial size was normal in size. Pericardium: Trivial pericardial effusion is present. Presence of pericardial fat pad. Mitral Valve: The mitral valve is grossly normal. Trivial mitral valve regurgitation. No evidence of mitral valve stenosis. Tricuspid Valve: The tricuspid valve is grossly normal. Tricuspid valve regurgitation is not demonstrated. No evidence of tricuspid stenosis. Aortic Valve: The aortic valve is tricuspid. There is mild calcification of the aortic valve. There is mild thickening of the aortic valve. Aortic valve regurgitation is not visualized. Mild aortic valve sclerosis is present, with no evidence of aortic valve stenosis. Aortic valve mean gradient measures 11.1 mmHg. Aortic valve peak gradient measures 16.7 mmHg. Aortic valve area, by VTI measures 2.42 cm. Pulmonic Valve: The pulmonic valve was grossly normal. Pulmonic valve regurgitation is not visualized. No evidence of pulmonic stenosis. Aorta: The aortic root is normal in size and structure.  Venous: The right upper pulmonary vein is normal. The inferior vena cava is normal in size with greater than 50% respiratory variability, suggesting right atrial pressure of 3 mmHg. IAS/Shunts: The atrial septum is grossly normal. Additional Comments: There is a small pleural effusion in the left lateral region.  LEFT VENTRICLE PLAX 2D LVIDd:         4.30 cm  Diastology LVIDs:         2.60 cm  LV e' medial:    8.81 cm/s LV PW:         1.10 cm  LV E/e' medial:  7.2 LV IVS:        1.10 cm  LV e' lateral:   7.51 cm/s LVOT  diam:     2.30 cm  LV E/e' lateral: 8.4 LV SV:         92 LV SV Index:   46 LVOT Area:     4.15 cm  RIGHT VENTRICLE RV Basal diam:  3.20 cm RV S prime:     11.00 cm/s TAPSE (M-mode): 2.9 cm LEFT ATRIUM             Index       RIGHT ATRIUM           Index LA diam:        3.30 cm 1.65 cm/m  RA Area:     15.30 cm LA Vol (A2C):   43.2 ml 21.62 ml/m RA Volume:   37.60 ml  18.82 ml/m LA Vol (A4C):   30.7 ml 15.37 ml/m LA Biplane Vol: 36.9 ml 18.47 ml/m  AORTIC VALVE AV Area (Vmax):    2.42 cm AV Area (Vmean):   2.14 cm AV Area (VTI):     2.42 cm AV Vmax:           204.20 cm/s AV Vmean:          159.222 cm/s AV VTI:            0.381 m AV Peak Grad:      16.7 mmHg AV Mean Grad:      11.1 mmHg LVOT Vmax:         119.00 cm/s LVOT Vmean:        82.200 cm/s LVOT VTI:          0.222 m LVOT/AV VTI ratio: 0.58  AORTA Ao Root diam: 3.10 cm MITRAL VALVE MV Area (PHT): 4.06 cm     SHUNTS MV Decel Time: 187 msec     Systemic VTI:  0.22 m MV E velocity: 63.40 cm/s   Systemic Diam: 2.30 cm MV A velocity: 104.00 cm/s MV E/A ratio:  0.61 Eleonore Chiquito MD Electronically signed by Eleonore Chiquito MD Signature Date/Time: 04/20/2020/10:32:25 AM    Final    [place] ASCITES (ABDOMEN LIMITED)  Result Date: 04/19/2020 CLINICAL DATA:  69 year old male with abdominal distension, ascites EXAM: LIMITED ABDOMEN ULTRASOUND FOR ASCITES TECHNIQUE: Limited ultrasound survey for ascites was performed in all four abdominal quadrants.  COMPARISON:  CT abdomen/pelvis 04/19/2020 FINDINGS: Sonographic interrogation of the abdomen was performed in anticipation of paracentesis. Unfortunately, the volume is a ascites is quite small and insufficient for drainage. There is however extensive omental caking and peritoneal nodularity. IMPRESSION: 1. Small volume ascites, insufficient for paracentesis. 2. Extensive omental caking and peritoneal nodularity consistent with peritoneal carcinomatosis. Electronically Signed   By: Jacqulynn Cadet M.D.   On: 04/19/2020 16:53   [place] EKG SITE RITE  Result Date: 04/19/2020 If Site Rite image not attached, placement could not be confirmed due to current cardiac rhythm.   ASSESSMENT AND PLAN: 69 yo male with   1) Stage IV Mantle cell cell lymphoma CLL FISH shows 17p mutation -04/26/2019 Flow Cytometry [phone]) revealed "Abnormal B-cell population identified. CD5+ clonal lymphoproliferative disorder" -04/26/2019 FISH revealed "Positive for p53 (17p13) deletion." -05/31/2019 PET/CT ([id number]) revealed "1. There are multiple prominent lymph nodes in the neck, both axilla, the retroperitoneum and pelvis with low level hypermetabolic activity consistent with chronic lymphocytic leukemia. Deauville 3 and 4. 2. Mild splenomegaly and mild generalized splenic hypermetabolic activity 3. No evidence of bone marrow involvement." -06/10/2019 Flow Pathology Report ([id number]) revealed "Abnormal B-cell population." -06/10/2019 Surgical Pathology Report ([id number])  revealed "Atypical lymphoid proliferation  consistent with mantle cell lymphoma." -09/19/19 PET Skull Base to Thigh ([id number])-- favorable response to current treatment -01/02/2020 PET/CT ([id number]) revealed "1. Continued further decrease in size and FDG accumulation in index cervical, axillary, retroperitoneal, and pelvic lymph nodes. No lymphadenopathy by size criteria today. FDG accumulation in these lymph nodes is compatible with  Deauville 2 category. 2. Interval decrease in size with resolution of hypermetabolism associated with the spleen previously."  -04/19/2020 CT abdomen pelvis revealed "1. Dominant finding is new large (15 cm) lymphoid mass within LEFT upper quadrant. Mass is new from PET-CT 01/02/2020. Differential includes massive mesenteric adenopathy versus lymphoma of the small bowel or descending colon. No oral or IV contrast. 2. New periaortic retroperitoneal lymphadenopathy. New precordial Adenopathy. 3. Extensive fluid within the mesentery and omental thickening in LEFT lower quadrant. 4. Free fluid the pelvis with thin enhancing rim suggest peritonitis. 5. No evidence of bowel perforation. No evidence of bowel Obstruction."  2) Rigors from Rituxan - needing demerol as premedication. Rituxan rate to be capped at $RemoveB'150mg'hwgVzLmL$ /hour  3) Thrombocytopenia from chemotherapy  4) Neutropenia ? Drug related. ? Idiosyncratic reaction to Rituxan  5) Rituxan related ILD  6) significant new abdominal distention due to lymphoma recurrence  7) new hypercalcemia- ?  Related to dehydration and  Lymphoma -    PLAN: -labs show hypercalcemia resolved. -Continue prednisone 80 mg and PPI for total of 5 days.  Monitor CBGs closely. -Continue allopurinol 300 mg daily due to risk for TLS.  Will check LDH and uric acid level tomorrow morning prior to chemotherapy.PICC line placed. -C1 of CHOP with premeds today. Udenyca as outpatient if discharged, alternatively will start on granix daily for 5 days. -no clinical evidence of bowel obstruction-- had bowel movement today -ABx per hospital medicine - CT chest no overt infiltrates. -anticipate patient might need to be in the hospital for 2-3 days to monitor for TLS and evaluation of improvement of abd symptoms. -depending on response and tolerance to CHOP C1 will determine options of proceeding with additional cycles of CHOP vs switching to Acalabrutinib (given anticipated  chemotherapy resistance with 17p in his mantle cell lymphoma) -patient agreeable with referral to [facility] for 2nd opinion given 17p mantle cell lymphoma to determine options for clinical trials vs candidacy for cellular therapies for consolidation if he can achieve CR2 or for salvage. -appreciate excellent hospital medicine cares   LOS: 2 days   Sullivan Lone, MD MS  TT 35 mins >50% on direct patient communication, counseling and co-ordination of cares

## 2020-04-21 NOTE — Progress Notes (Signed)
PROGRESS NOTE    Dean Dixon  ALP:[id number] DOB: 02/24/1951 DOA: 04/19/2020 PCP: Leonides Sake, MD    Brief Narrative:  69 year old male with history of mantle cell lymphoma, provoked VTE, hypertension and diabetes brought to ER from cancer center where he presented with complaints of fatigue, decreased oral intake and he was found to have hypercalcemia 12.3.  Was also complaining of abdominal distention and constipation. In the emergency room, he was afebrile.  He was tachycardic and tachypneic.  He was started on 2 L oxygen.  Creatinine 1.13 with previous creatinine of 0.8.  Calcium 12.3.  Chest x-ray with atelectasis.  CT scan with new large 15 cm lymphoid mass within the left upper quadrant new from recent PET scan and plenty of retroperitoneal lymphadenopathy and precordial adenopathy.  Omental thickening.  Pelvic fluid.  Admitted to the hospital with significant symptoms and for inpatient chemotherapy.   Assessment & Plan:   Principal Problem:   Hypercalcemia Active Problems:   HTN (hypertension)   Mantle cell lymphoma of lymph nodes of multiple regions (HCC)   CAP (community acquired pneumonia)   Diabetes mellitus without complication ([place])   Acute respiratory failure with hypoxia (HCC)   Sepsis ([place])   Acute diastolic CHF (congestive heart failure) (HCC)  Hypercalcemia of malignancy: Also aggravated by dehydration. Treated with IV fluids with improvement. Decrease parathyroid hormone levels consistent with hypercalcemia of malignancy. Normalized.  Encourage oral hydration.   SIRS: Present on admission.  Presented with tachycardia tachypnea leukocytosis, lactic acidosis and hypoxia.  No clear-cut source of infection. Blood cultures are sent.  Patient is on Rocephin azithromycin.  His hypoxia is mostly related to atelectasis and abdominal distention. Chest physiotherapy, deep breathing exercises and mobility. Continue antibiotics.  Will treat with 7 days of therapy.   Changed to oral on discharge.  Mantle cell lymphoma with abdominal distention/metastatic disease: Patient with significant lymphadenopathy, lymphoid mass with incomplete bowel obstruction. Unable to get fluid pocket for paracentesis. Bowel functions present.  Will allow regular diet and monitor output and monitor bowel function. Patient received PICC line.  Plan for chemotherapy today.  Hypertension: Blood pressure stable on amlodipine and ramipril.  Type 2 diabetes: Stable on sliding scale.   DVT prophylaxis: enoxaparin (LOVENOX) injection 40 mg Start: 04/19/20 2200   Code Status: Full code Family Communication: None.  Patient stated that he will talk to them himself. Disposition Plan: Status is: Inpatient  Remains inpatient appropriate because:IV treatments appropriate due to intensity of illness or inability to take PO and Inpatient level of care appropriate due to severity of illness   Dispo: The patient is from: Home              Anticipated d/c is to: Home              Anticipated d/c date is: > 3 days              Patient currently is not medically stable to d/c.         Consultants:   Oncology  Procedures:   None  Antimicrobials:   Rocephin and azithromycin, 10/28>>>   Subjective: Seen and examined.  Abdominal distention remains however denies any nausea vomiting.  Wheezing has improved.  Was able to go to bathroom without shortness of breath.  No cough or fever. He had small formed stool last night.  Passing flatus.  Objective: Vitals:   04/20/20 2328 04/21/20 0336 04/21/20 0715 04/21/20 0729  BP: (!) 162/84 (!) 160/87  Marland Kitchen)  165/84  Pulse: 94 88  87  Resp: 16 14    Temp: 97.9 F (36.6 C) 97.6 F (36.4 C)  97.8 F (36.6 C)  TempSrc: Oral Oral  Oral  SpO2: 92% 91%  93%  Weight:   95.8 kg   Height:        Intake/Output Summary (Last 24 hours) at 04/21/2020 1029 Last data filed at 04/21/2020 0341 Gross per 24 hour  Intake 480 ml  Output 200  ml  Net 280 ml   Filed Weights   04/19/20 1118 04/20/20 1936 04/21/20 0715  Weight: 95.3 kg 93.2 kg 95.8 kg    Examination:  General exam: Appears calm and comfortable  Respiratory system: Clear to auscultation. Respiratory effort normal.  No added sounds.  Comfortable on room air. Cardiovascular system: S1 & S2 heard, RRR. No JVD, murmurs, rubs, gallops or clicks. No pedal edema. Gastrointestinal system: Abdomen soft, distended, bowel sounds hyperactive.  Palpable mass on the left upper quadrant. Central nervous system: Alert and oriented. No focal neurological deficits. Extremities: Symmetric 5 x 5 power. Skin: No rashes, lesions or ulcers Psychiatry: Judgement and insight appear normal. Mood & affect appropriate.     Data Reviewed: I have personally reviewed following labs and imaging studies  CBC: Recent Labs  Lab 04/19/20 0922 04/19/20 1616 04/20/20 0348 04/21/20 0503  WBC 11.3* 13.4* 14.1* 14.9*  NEUTROABS 6.4 11.1*  --  10.3*  HGB 12.2* 11.9* 10.8* 10.1*  HCT 37.3* 37.0* 33.3* 32.1*  MCV 85.7 87.3 88.1 90.2  PLT 197 203 171 355   Basic Metabolic Panel: Recent Labs  Lab 04/19/20 0922 04/19/20 1132 04/19/20 1505 04/19/20 1616 04/20/20 0348 04/21/20 0503  NA 144 146*  --  146* 143 144  K 3.2* 3.2*  --  3.1* 3.4* 3.3*  CL 102 102  --  102 101 104  CO2 32 30  --  26 26 28   GLUCOSE 169* 186*  --  218* 211* 191*  BUN 9 11  --  12 16 24*  CREATININE 1.13 1.05  --  1.02 1.04 1.07  CALCIUM 12.3* 12.4*  --  12.4* 11.0* 9.9  MG  --   --  2.2  --   --  2.1  PHOS  --   --   --   --   --  2.4*   GFR: Estimated Creatinine Clearance: 67.1 mL/min (by C-G formula based on SCr of 1.07 mg/dL). Liver Function Tests: Recent Labs  Lab 04/19/20 0922 04/19/20 1132 04/19/20 1616 04/21/20 0503  AST 16 19 16 17   ALT 10 12 12 12   ALKPHOS 89 82 72 69  BILITOT 0.7 0.9 1.0 0.6  PROT 6.5 7.2 6.5 6.0*  ALBUMIN 4.0 4.4 4.0 3.6   No results for input(s): LIPASE, AMYLASE in  the last 168 hours. No results for input(s): AMMONIA in the last 168 hours. Coagulation Profile: No results for input(s): INR, PROTIME in the last 168 hours. Cardiac Enzymes: No results for input(s): CKTOTAL, CKMB, CKMBINDEX, TROPONINI in the last 168 hours. BNP (last 3 results) No results for input(s): PROBNP in the last 8760 hours. HbA1C: No results for input(s): HGBA1C in the last 72 hours. CBG: Recent Labs  Lab 04/20/20 0755 04/20/20 1131 04/20/20 1623 04/20/20 2126 04/21/20 0751  GLUCAP 178* 119* 186* 211* 141*   Lipid Profile: No results for input(s): CHOL, HDL, LDLCALC, TRIG, CHOLHDL, LDLDIRECT in the last 72 hours. Thyroid Function Tests: No results for input(s): TSH, T4TOTAL, FREET4, T3FREE,  THYROIDAB in the last 72 hours. Anemia Panel: No results for input(s): VITAMINB12, FOLATE, FERRITIN, TIBC, IRON, RETICCTPCT in the last 72 hours. Sepsis Labs: Recent Labs  Lab 04/19/20 1132 04/19/20 1345 04/19/20 1624 04/19/20 2040  LATICACIDVEN 2.3* 2.7* 1.9 1.6    Recent Results (from the past 240 hour(s))  Respiratory Panel by RT PCR (Flu A&B, Covid) - Nasopharyngeal Swab     Status: None   Collection Time: 04/19/20 11:53 AM   Specimen: Nasopharyngeal Swab  Result Value Ref Range Status   SARS Coronavirus 2 by RT PCR NEGATIVE NEGATIVE Final    Comment: (NOTE) SARS-CoV-2 target nucleic acids are NOT DETECTED.  The SARS-CoV-2 RNA is generally detectable in upper respiratoy specimens during the acute phase of infection. The lowest concentration of SARS-CoV-2 viral copies this assay can detect is 131 copies/mL. A negative result does not preclude SARS-Cov-2 infection and should not be used as the sole basis for treatment or other patient management decisions. A negative result may occur with  improper specimen collection/handling, submission of specimen other than nasopharyngeal swab, presence of viral mutation(s) within the areas targeted by this assay, and inadequate  number of viral copies (<131 copies/mL). A negative result must be combined with clinical observations, patient history, and epidemiological information. The expected result is Negative.  Fact Sheet for Patients:  PinkCheek.be  Fact Sheet for Healthcare Providers:  GravelBags.it  This test is no t yet approved or cleared by the [place] FDA and  has been authorized for detection and/or diagnosis of SARS-CoV-2 by FDA under an Emergency Use Authorization (EUA). This EUA will remain  in effect (meaning this test can be used) for the duration of the COVID-19 declaration under Section 564(b)(1) of the Act, 21 U.S.C. section 360bbb-3(b)(1), unless the authorization is terminated or revoked sooner.     Influenza A by PCR NEGATIVE NEGATIVE Final   Influenza B by PCR NEGATIVE NEGATIVE Final    Comment: (NOTE) The Xpert Xpress SARS-CoV-2/FLU/RSV assay is intended as an aid in  the diagnosis of influenza from Nasopharyngeal swab specimens and  should not be used as a sole basis for treatment. Nasal washings and  aspirates are unacceptable for Xpert Xpress SARS-CoV-2/FLU/RSV  testing.  Fact Sheet for Patients: PinkCheek.be  Fact Sheet for Healthcare Providers: GravelBags.it  This test is not yet approved or cleared by the [place] FDA and  has been authorized for detection and/or diagnosis of SARS-CoV-2 by  FDA under an Emergency Use Authorization (EUA). This EUA will remain  in effect (meaning this test can be used) for the duration of the  Covid-19 declaration under Section 564(b)(1) of the Act, 21  U.S.C. section 360bbb-3(b)(1), unless the authorization is  terminated or revoked. Performed at [facility], [place] [address], [place], [place] [address]   Culture, blood (x 2)     Status: None (Preliminary result)   Collection Time: 04/19/20   4:17 PM   Specimen: BLOOD  Result Value Ref Range Status   Specimen Description   Final    BLOOD LEFT ANTECUBITAL Performed at [facility] [address], [place], [place] [address]    Special Requests   Final    BOTTLES DRAWN AEROBIC ONLY Blood Culture adequate volume Performed at [facility] [address], [place], [place] [address]    Culture   Final    NO GROWTH < 24 HOURS Performed at [facility] [address] [place], [place] [address]  Report Status PENDING  Incomplete  Culture, blood (x 2)     Status: None (Preliminary result)   Collection Time: 04/19/20  4:24 PM   Specimen: BLOOD LEFT FOREARM  Result Value Ref Range Status   Specimen Description   Final    BLOOD LEFT FOREARM Performed at [facility] [address] [place], [place] [address]    Special Requests   Final    BOTTLES DRAWN AEROBIC ONLY Blood Culture adequate volume Performed at [facility] [address] [place], [place] [address]    Culture   Final    NO GROWTH < 24 HOURS Performed at [facility] [address], [place], [place] [address]    Report Status PENDING  Incomplete         Radiology Studies: CT ABDOMEN PELVIS WO CONTRAST  Addendum Date: 04/19/2020   ADDENDUM REPORT: 04/19/2020 12:54 ADDENDUM: The peritoneal fluid within the pelvis is not enhancing as this is a non IV contrast imaging exam. Therefore would described fluid collection as having thickened rim. Favor peritoneal metastasis. Findings conveyed toSCOTT GOLDSTON on 04/19/2020  at12:54. Electronically Signed   By: Suzy Bouchard M.D.   On: 04/19/2020 12:54   Result Date: 04/19/2020 CLINICAL DATA:  Abdominal pain.  Tachycardia.  Mantle cell lymphoma EXAM: CT ABDOMEN AND PELVIS WITHOUT CONTRAST TECHNIQUE: Multidetector CT imaging of the abdomen and pelvis was performed following the standard protocol without IV contrast.  COMPARISON:  PET-CT scan 01/02/2020 FINDINGS: Lower chest: New small RIGHT effusion. No pericardial effusion. There is new precordial adenopathy anterior to the RIGHT heart and liver (image 9/2). Hepatobiliary: New intraperitoneal free fluid along the margin of the RIGHT hepatic lobe. This fluid is simple attenuation. The liver appears unchanged on noncontrast exam. Large cyst the LEFT hepatic lobe. Gallbladder normal. Pancreas: Pancreas is grossly normal noncontrast exam Spleen: . spleen is unchanged Adrenals/urinary tract: Adrenal glands kidneys and ureters normal. Bladder Stomach/Bowel: Stomach duodenum normal. There is a new mass lesion in the LEFT upper quadrant measuring 14.2 by 13.0 cm. The mass may be associated the small bowel or the small bowel mesentery. The descending colon is also involving the mass (image 43/2. There is extensive fluid within the leaves of the mesentery of the distal small bowel. Appendix normal. Ascending transverse colon normal. The descending colon tracks along the new LEFT upper quadrant mass. Vascular/Lymphatic: New large LEFT upper quadrant mass either represents lymphoma involving the small bowel or mesentery. Mass is very large described the stomach section. Additionally there is new periportal adenopathy with enlarged lymph nodes along the aorta measuring up to 2 cm (image 44/2. There is nodularity along the greater omentum abdominal on the LEFT. Reproductive: Prostate unremarkable Other: Small volume free fluid the pelvis. There is enhancing rim to the fluid (image 73/2 which could indicate peritonitis. Musculoskeletal: No aggressive osseous lesion. IMPRESSION: 1. Dominant finding is new large (15 cm) lymphoid mass within LEFT upper quadrant. Mass is new from PET-CT 01/02/2020. Differential includes massive mesenteric adenopathy versus lymphoma of the small bowel or descending colon. No oral or IV contrast. 2. New periaortic retroperitoneal lymphadenopathy. New precordial  adenopathy. 3. Extensive fluid within the mesentery and omental thickening in LEFT lower quadrant. 4. Free fluid the pelvis with thin enhancing rim suggest peritonitis. 5. No evidence of bowel perforation. No evidence of bowel obstruction. Electronically Signed: By: Suzy Bouchard M.D. On: 04/19/2020 12:39   CT CHEST WO CONTRAST  Result Date: 04/19/2020 CLINICAL DATA:  Pneumonia. Pleural effusion. Concern for metastases. EXAM: CT CHEST WITHOUT CONTRAST TECHNIQUE: Multidetector CT imaging of the chest was performed following the standard protocol without IV contrast. COMPARISON:  CT dated Nov 14, 2019 FINDINGS: Cardiovascular: The heart size is stable. Coronary artery calcifications are noted. There is no significant pericardial effusion. Mediastinum/Nodes: -- No mediastinal lymphadenopathy. -- No hilar lymphadenopathy. --there is left axillary adenopathy. This has progressed since the prior study. There is mild right axillary adenopathy, progressed from prior study. -- No supraclavicular lymphadenopathy. -- Normal thyroid gland where visualized. -there is mild diffuse esophageal wall thickening. Lungs/Pleura: There are trace to small bilateral pleural effusions, right greater than left. There is atelectasis at the right lung base. There is no pneumothorax. No area of consolidation concerning for pneumonia. Upper Abdomen: There is a partially visualized mass in the left upper quadrant, better visualized on the patient's prior CT. There is free fluid in the upper abdomen. Multiple enlarged pericardiophrenic lymph nodes are noted. There appears to be retroperitoneal adenopathy. Musculoskeletal: No chest wall abnormality. No bony spinal canal stenosis. IMPRESSION: 1. Trace to small bilateral pleural effusions, right greater than left. There is atelectasis at the right lung base. No area of consolidation concerning for pneumonia. 2. Mild diffuse esophageal wall thickening. Correlation with patient's symptoms is  recommended. 3. Worsening axillary adenopathy. 4. Partially visualized mass in the left upper quadrant, better visualized on the patient's prior CT. 5. Small volume ascites. Aortic Atherosclerosis (ICD10-I70.0). Electronically Signed   By: Constance Holster M.D.   On: 04/19/2020 20:37   DG Chest Portable 1 View  Result Date: 04/19/2020 CLINICAL DATA:  Cough, wheezing EXAM: PORTABLE CHEST 1 VIEW COMPARISON:  01/09/2020 FINDINGS: Low lung volumes. Minimal atelectasis or scarring at the left lung base. No pleural effusion or pneumothorax. Heart size is within normal limits for technique. IMPRESSION: Minimal atelectasis or scarring at the left lung base. Electronically Signed   By: Macy Mis M.D.   On: 04/19/2020 12:18   ECHOCARDIOGRAM COMPLETE  Result Date: 04/20/2020    ECHOCARDIOGRAM REPORT   Patient Name:   JONTY MORRICAL Date of Exam: 04/20/2020 Medical Rec #:  [id number]    Height:       64.0 in Accession #:    [id number]   Weight:       210.1 lb Date of Birth:  December 13, 1950   BSA:          1.998 m Patient Age:    56 years     BP:           156/83 mmHg Patient Gender: M            HR:           88 bpm. Exam Location:  Inpatient Procedure: 2D Echo, Cardiac Doppler and Color Doppler Indications:    Chemo evaluation  History:        Patient has prior history of Echocardiogram examinations, most                 recent 11/14/2019. COPD; Risk Factors:Hypertension and Diabetes.                 Mantle cell lymphoma.  Sonographer:    Dustin Flock Referring Phys: [place]  1. Left ventricular ejection fraction, by estimation, is 65 to 70%. The left ventricle has normal function. The left ventricle has no regional wall motion abnormalities. Left ventricular diastolic parameters are consistent with Grade I diastolic dysfunction (impaired relaxation).  2.  Right ventricular systolic function is normal. The right ventricular size is normal. Tricuspid regurgitation signal is inadequate for  assessing PA pressure.  3. The mitral valve is grossly normal. Trivial mitral valve regurgitation. No evidence of mitral stenosis.  4. The aortic valve is tricuspid. There is mild calcification of the aortic valve. There is mild thickening of the aortic valve. Aortic valve regurgitation is not visualized. Mild aortic valve sclerosis is present, with no evidence of aortic valve stenosis.  5. The inferior vena cava is normal in size with greater than 50% respiratory variability, suggesting right atrial pressure of 3 mmHg. Comparison(s): Changes from prior study are noted. EF is now 65-70%. FINDINGS  Left Ventricle: Left ventricular ejection fraction, by estimation, is 65 to 70%. The left ventricle has normal function. The left ventricle has no regional wall motion abnormalities. The left ventricular internal cavity size was normal in size. There is  no left ventricular hypertrophy. Left ventricular diastolic parameters are consistent with Grade I diastolic dysfunction (impaired relaxation). Normal left ventricular filling pressure. Right Ventricle: The right ventricular size is normal. No increase in right ventricular wall thickness. Right ventricular systolic function is normal. Tricuspid regurgitation signal is inadequate for assessing PA pressure. Left Atrium: Left atrial size was normal in size. Right Atrium: Right atrial size was normal in size. Pericardium: Trivial pericardial effusion is present. Presence of pericardial fat pad. Mitral Valve: The mitral valve is grossly normal. Trivial mitral valve regurgitation. No evidence of mitral valve stenosis. Tricuspid Valve: The tricuspid valve is grossly normal. Tricuspid valve regurgitation is not demonstrated. No evidence of tricuspid stenosis. Aortic Valve: The aortic valve is tricuspid. There is mild calcification of the aortic valve. There is mild thickening of the aortic valve. Aortic valve regurgitation is not visualized. Mild aortic valve sclerosis is present,  with no evidence of aortic valve stenosis. Aortic valve mean gradient measures 11.1 mmHg. Aortic valve peak gradient measures 16.7 mmHg. Aortic valve area, by VTI measures 2.42 cm. Pulmonic Valve: The pulmonic valve was grossly normal. Pulmonic valve regurgitation is not visualized. No evidence of pulmonic stenosis. Aorta: The aortic root is normal in size and structure. Venous: The right upper pulmonary vein is normal. The inferior vena cava is normal in size with greater than 50% respiratory variability, suggesting right atrial pressure of 3 mmHg. IAS/Shunts: The atrial septum is grossly normal. Additional Comments: There is a small pleural effusion in the left lateral region.  LEFT VENTRICLE PLAX 2D LVIDd:         4.30 cm  Diastology LVIDs:         2.60 cm  LV e' medial:    8.81 cm/s LV PW:         1.10 cm  LV E/e' medial:  7.2 LV IVS:        1.10 cm  LV e' lateral:   7.51 cm/s LVOT diam:     2.30 cm  LV E/e' lateral: 8.4 LV SV:         92 LV SV Index:   46 LVOT Area:     4.15 cm  RIGHT VENTRICLE RV Basal diam:  3.20 cm RV S prime:     11.00 cm/s TAPSE (M-mode): 2.9 cm LEFT ATRIUM             Index       RIGHT ATRIUM           Index LA diam:        3.30 cm 1.65 cm/m  RA Area:     15.30 cm LA Vol (A2C):   43.2 ml 21.62 ml/m RA Volume:   37.60 ml  18.82 ml/m LA Vol (A4C):   30.7 ml 15.37 ml/m LA Biplane Vol: 36.9 ml 18.47 ml/m  AORTIC VALVE AV Area (Vmax):    2.42 cm AV Area (Vmean):   2.14 cm AV Area (VTI):     2.42 cm AV Vmax:           204.20 cm/s AV Vmean:          159.222 cm/s AV VTI:            0.381 m AV Peak Grad:      16.7 mmHg AV Mean Grad:      11.1 mmHg LVOT Vmax:         119.00 cm/s LVOT Vmean:        82.200 cm/s LVOT VTI:          0.222 m LVOT/AV VTI ratio: 0.58  AORTA Ao Root diam: 3.10 cm MITRAL VALVE MV Area (PHT): 4.06 cm     SHUNTS MV Decel Time: 187 msec     Systemic VTI:  0.22 m MV E velocity: 63.40 cm/s   Systemic Diam: 2.30 cm MV A velocity: 104.00 cm/s MV E/A ratio:  0.61 Eleonore Chiquito MD Electronically signed by Eleonore Chiquito MD Signature Date/Time: 04/20/2020/10:32:25 AM    Final    [place] ASCITES (ABDOMEN LIMITED)  Result Date: 04/19/2020 CLINICAL DATA:  69 year old male with abdominal distension, ascites EXAM: LIMITED ABDOMEN ULTRASOUND FOR ASCITES TECHNIQUE: Limited ultrasound survey for ascites was performed in all four abdominal quadrants. COMPARISON:  CT abdomen/pelvis 04/19/2020 FINDINGS: Sonographic interrogation of the abdomen was performed in anticipation of paracentesis. Unfortunately, the volume is a ascites is quite small and insufficient for drainage. There is however extensive omental caking and peritoneal nodularity. IMPRESSION: 1. Small volume ascites, insufficient for paracentesis. 2. Extensive omental caking and peritoneal nodularity consistent with peritoneal carcinomatosis. Electronically Signed   By: Jacqulynn Cadet M.D.   On: 04/19/2020 16:53   [place] EKG SITE RITE  Result Date: 04/19/2020 If Site Rite image not attached, placement could not be confirmed due to current cardiac rhythm.       Scheduled Meds: . allopurinol  300 mg Oral Daily  . amLODipine  10 mg Oral Daily  . Chlorhexidine Gluconate Cloth  6 each Topical Daily  . cyclophosphamide  750 mg/m2 (Treatment Plan Recorded) Intravenous Once  . DOXOrubicin  48 mg/m2 (Treatment Plan Recorded) Intravenous Once  . enoxaparin (LOVENOX) injection  40 mg Subcutaneous Q24H  . feeding supplement (GLUCERNA SHAKE)  237 mL Oral TID BM  . insulin aspart  0-9 Units Subcutaneous TID WC  . pantoprazole  40 mg Oral Daily  . potassium chloride  20 mEq Oral BID  . predniSONE  80 mg Oral Q breakfast  . ramipril  10 mg Oral Daily  . sodium chloride flush  10-40 mL Intracatheter Q12H  . sodium chloride flush  3 mL Intravenous Q12H  . vinCRIStine (ONCOVIN) CHEMO IV infusion  2 mg Intravenous Once   Continuous Infusions: . sodium chloride    . azithromycin 500 mg (04/20/20 1643)  . cefTRIAXone  (ROCEPHIN)  IV 2 g (04/20/20 1606)  . fosaprepitant (EMEND) IV infusion 150 mg       LOS: 2 days    Time spent: 30 minutes    Barb Merino, MD [facility] Pager [phone]

## 2020-04-22 LAB — CBC WITH DIFFERENTIAL/PLATELET
Abs Immature Granulocytes: 0.66 10*3/uL — ABNORMAL HIGH (ref 0.00–0.07)
Basophils Absolute: 0.1 10*3/uL (ref 0.0–0.1)
Basophils Relative: 0 %
Eosinophils Absolute: 0 10*3/uL (ref 0.0–0.5)
Eosinophils Relative: 0 %
HCT: 32.9 % — ABNORMAL LOW (ref 39.0–52.0)
Hemoglobin: 10.4 g/dL — ABNORMAL LOW (ref 13.0–17.0)
Immature Granulocytes: 5 %
Lymphocytes Relative: 13 %
Lymphs Abs: 1.8 10*3/uL (ref 0.7–4.0)
MCH: 28.7 pg (ref 26.0–34.0)
MCHC: 31.6 g/dL (ref 30.0–36.0)
MCV: 90.6 fL (ref 80.0–100.0)
Monocytes Absolute: 1.5 10*3/uL — ABNORMAL HIGH (ref 0.1–1.0)
Monocytes Relative: 11 %
Neutro Abs: 10 10*3/uL — ABNORMAL HIGH (ref 1.7–7.7)
Neutrophils Relative %: 71 %
Platelets: 185 10*3/uL (ref 150–400)
RBC: 3.63 MIL/uL — ABNORMAL LOW (ref 4.22–5.81)
RDW: 17.9 % — ABNORMAL HIGH (ref 11.5–15.5)
WBC: 14 10*3/uL — ABNORMAL HIGH (ref 4.0–10.5)
nRBC: 0 % (ref 0.0–0.2)

## 2020-04-22 LAB — GLUCOSE, CAPILLARY
Glucose-Capillary: 163 mg/dL — ABNORMAL HIGH (ref 70–99)
Glucose-Capillary: 169 mg/dL — ABNORMAL HIGH (ref 70–99)
Glucose-Capillary: 201 mg/dL — ABNORMAL HIGH (ref 70–99)
Glucose-Capillary: 161 mg/dL — ABNORMAL HIGH (ref 70–99)

## 2020-04-22 LAB — COMPREHENSIVE METABOLIC PANEL
ALT: 13 U/L (ref 0–44)
AST: 16 U/L (ref 15–41)
Albumin: 3.7 g/dL (ref 3.5–5.0)
Alkaline Phosphatase: 70 U/L (ref 38–126)
Anion gap: 15 (ref 5–15)
BUN: 27 mg/dL — ABNORMAL HIGH (ref 8–23)
CO2: 25 mmol/L (ref 22–32)
Calcium: 9.3 mg/dL (ref 8.9–10.3)
Chloride: 106 mmol/L (ref 98–111)
Creatinine, Ser: 1.04 mg/dL (ref 0.61–1.24)
GFR, Estimated: >60 mL/min (ref >60)
Glucose, Bld: 178 mg/dL — ABNORMAL HIGH (ref 70–99)
Potassium: 3.5 mmol/L (ref 3.5–5.1)
Sodium: 146 mmol/L — ABNORMAL HIGH (ref 135–145)
Total Bilirubin: 0.6 mg/dL (ref 0.3–1.2)
Total Protein: 6 g/dL — ABNORMAL LOW (ref 6.5–8.1)

## 2020-04-22 MED ORDER — LOPERAMIDE HCL 2 MG PO CAPS: 2.0000 mg | ORAL_CAPSULE | Freq: Four times a day (QID) | ORAL | Status: DC | PRN

## 2020-04-22 MED ORDER — DEXTROSE-NACL 5-0.45 % IV SOLN: INTRAVENOUS | Status: DC

## 2020-04-22 NOTE — Progress Notes (Signed)
Dean Dixon   DOB:1950/10/01   GE#:[id number]   MWN#:[id number]  Subjective:  Patient is comfortable supine in bed. Has developed loose BMs since yesterday. Otherwise no N/V, no cought.phlegmproduction or pleurisy; PICC working well   Objective: white man exmined in bed Vitals:   04/21/20 2033 04/22/20 0557  BP: 136/73 (!) 169/86  Pulse: 89 87  Resp: 18 16  Temp: 98.3 F (36.8 C) 97.9 F (36.6 C)  SpO2: 92% 92%    Body mass index is 36.25 kg/m. No intake or output data in the 24 hours ending 04/22/20 1110    Lungs no rales or wheezes--auscultated anterolaterally  Heart regular rate and rhythm  Abdomen soft, +BS  Neuro nonfocal   CBG (last 3)  Recent Labs    04/21/20 1616 04/21/20 2037 04/22/20 0806  GLUCAP 207* 288* 163*     Labs:  Lab Results  Component Value Date   WBC 14.0 (H) 04/22/2020   HGB 10.4 (L) 04/22/2020   HCT 32.9 (L) 04/22/2020   MCV 90.6 04/22/2020   PLT 185 04/22/2020   NEUTROABS 10.0 (H) 04/22/2020    @LASTCHEMISTRY @  Urine Studies No results for input(s): UHGB, CRYS in the last 72 hours.  Invalid input(s): UACOL, UAPR, USPG, UPH, UTP, UGL, [place], UBIL, UNIT, UROB, ULEU, UEPI, UWBC, URBC, [place], [place], [place], [place]  Basic Metabolic Panel: Recent Labs  Lab 04/19/20 1132 04/19/20 1132 04/19/20 1505 04/19/20 1616 04/19/20 1616 04/20/20 0348 04/20/20 0348 04/21/20 0503 04/22/20 0642  NA 146*  --   --  146*  --  143  --  144 146*  K 3.2*   < >  --  3.1*   < > 3.4*   < > 3.3* 3.5  CL 102  --   --  102  --  101  --  104 106  CO2 30  --   --  26  --  26  --  28 25  GLUCOSE 186*  --   --  218*  --  211*  --  191* 178*  BUN 11  --   --  12  --  16  --  24* 27*  CREATININE 1.05  --   --  1.02  --  1.04  --  1.07 1.04  CALCIUM 12.4*  --   --  12.4*  --  11.0*  --  9.9 9.3  MG  --   --  2.2  --   --   --   --  2.1  --   PHOS  --   --   --   --   --   --   --  2.4*  --    < > = values in this interval not displayed.   GFR Estimated  Creatinine Clearance: 69 mL/min (by C-G formula based on SCr of 1.04 mg/dL). Liver Function Tests: Recent Labs  Lab 04/19/20 0922 04/19/20 1132 04/19/20 1616 04/21/20 0503 04/22/20 0642  AST 16 19 16 17 16   ALT 10 12 12 12 13   ALKPHOS 89 82 72 69 70  BILITOT 0.7 0.9 1.0 0.6 0.6  PROT 6.5 7.2 6.5 6.0* 6.0*  ALBUMIN 4.0 4.4 4.0 3.6 3.7   No results for input(s): LIPASE, AMYLASE in the last 168 hours. No results for input(s): AMMONIA in the last 168 hours. Coagulation profile No results for input(s): INR, PROTIME in the last 168 hours.  CBC: Recent Labs  Lab 04/19/20 0922 04/19/20 1616 04/20/20 0348 04/21/20 0503 04/22/20  0642  WBC 11.3* 13.4* 14.1* 14.9* 14.0*  NEUTROABS 6.4 11.1*  --  10.3* 10.0*  HGB 12.2* 11.9* 10.8* 10.1* 10.4*  HCT 37.3* 37.0* 33.3* 32.1* 32.9*  MCV 85.7 87.3 88.1 90.2 90.6  PLT 197 203 171 182 185   Cardiac Enzymes: No results for input(s): CKTOTAL, CKMB, CKMBINDEX, TROPONINI in the last 168 hours. BNP: Invalid input(s): POCBNP CBG: Recent Labs  Lab 04/21/20 0751 04/21/20 1144 04/21/20 1616 04/21/20 2037 04/22/20 0806  GLUCAP 141* 196* 207* 288* 163*   D-Dimer No results for input(s): DDIMER in the last 72 hours. Hgb A1c No results for input(s): HGBA1C in the last 72 hours. Lipid Profile No results for input(s): CHOL, HDL, LDLCALC, TRIG, CHOLHDL, LDLDIRECT in the last 72 hours. Thyroid function studies No results for input(s): TSH, T4TOTAL, T3FREE, THYROIDAB in the last 72 hours.  Invalid input(s): FREET3 Anemia work up No results for input(s): VITAMINB12, FOLATE, FERRITIN, TIBC, IRON, RETICCTPCT in the last 72 hours. Microbiology Recent Results (from the past 240 hour(s))  Respiratory Panel by RT PCR (Flu A&B, Covid) - Nasopharyngeal Swab     Status: None   Collection Time: 04/19/20 11:53 AM   Specimen: Nasopharyngeal Swab  Result Value Ref Range Status   SARS Coronavirus 2 by RT PCR NEGATIVE NEGATIVE Final    Comment:  (NOTE) SARS-CoV-2 target nucleic acids are NOT DETECTED.  The SARS-CoV-2 RNA is generally detectable in upper respiratoy specimens during the acute phase of infection. The lowest concentration of SARS-CoV-2 viral copies this assay can detect is 131 copies/mL. A negative result does not preclude SARS-Cov-2 infection and should not be used as the sole basis for treatment or other patient management decisions. A negative result may occur with  improper specimen collection/handling, submission of specimen other than nasopharyngeal swab, presence of viral mutation(s) within the areas targeted by this assay, and inadequate number of viral copies (<131 copies/mL). A negative result must be combined with clinical observations, patient history, and epidemiological information. The expected result is Negative.  Fact Sheet for Patients:  PinkCheek.be  Fact Sheet for Healthcare Providers:  GravelBags.it  This test is no t yet approved or cleared by the [place] FDA and  has been authorized for detection and/or diagnosis of SARS-CoV-2 by FDA under an Emergency Use Authorization (EUA). This EUA will remain  in effect (meaning this test can be used) for the duration of the COVID-19 declaration under Section 564(b)(1) of the Act, 21 U.S.C. section 360bbb-3(b)(1), unless the authorization is terminated or revoked sooner.     Influenza A by PCR NEGATIVE NEGATIVE Final   Influenza B by PCR NEGATIVE NEGATIVE Final    Comment: (NOTE) The Xpert Xpress SARS-CoV-2/FLU/RSV assay is intended as an aid in  the diagnosis of influenza from Nasopharyngeal swab specimens and  should not be used as a sole basis for treatment. Nasal washings and  aspirates are unacceptable for Xpert Xpress SARS-CoV-2/FLU/RSV  testing.  Fact Sheet for Patients: PinkCheek.be  Fact Sheet for Healthcare  Providers: GravelBags.it  This test is not yet approved or cleared by the [place] FDA and  has been authorized for detection and/or diagnosis of SARS-CoV-2 by  FDA under an Emergency Use Authorization (EUA). This EUA will remain  in effect (meaning this test can be used) for the duration of the  Covid-19 declaration under Section 564(b)(1) of the Act, 21  U.S.C. section 360bbb-3(b)(1), unless the authorization is  terminated or revoked. Performed at [facility], [address] [place], [place] [address]   Culture, blood (x 2)     Status: None (Preliminary result)   Collection Time: 04/19/20  4:17 PM   Specimen: BLOOD  Result Value Ref Range Status   Specimen Description   Final    BLOOD LEFT ANTECUBITAL Performed at [facility] [address] [place], [place] [address]    Special Requests   Final    BOTTLES DRAWN AEROBIC ONLY Blood Culture adequate volume Performed at [facility] [address] [place], [place] [address]    Culture   Final    NO GROWTH 3 DAYS Performed at [facility], [place] [address], [place], [place] [address]    Report Status PENDING  Incomplete  Culture, blood (x 2)     Status: None (Preliminary result)   Collection Time: 04/19/20  4:24 PM   Specimen: BLOOD LEFT FOREARM  Result Value Ref Range Status   Specimen Description   Final    BLOOD LEFT FOREARM Performed at [facility] [address], [place], [place] [address]    Special Requests   Final    BOTTLES DRAWN AEROBIC ONLY Blood Culture adequate volume Performed at [facility] [address] [place], [place] [address]    Culture   Final    NO GROWTH 3 DAYS Performed at [facility], [place] [address], [place], [place] [address]    Report Status PENDING  Incomplete      Studies:  No results found.  Assessment: 69 y.o. 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 He does not have fever, cramps or other symptoms to suggest C difficile. He otherwise tolerated yesterday's treatment well.   Sodium running a bit high-- will start 1/2 NS at 75/hr  Encouraged OOBTC and ambulate with assistance.   Chauncey Cruel, MD 04/22/2020  11:10 AM Medical Oncology and Hematology [facility] [address] [place], [place] [address] Tel. [phone]    Fax. [phone]

## 2020-04-22 NOTE — Progress Notes (Signed)
PROGRESS NOTE    Dean Dixon  NWG:[id number] DOB: Apr 08, 1951 DOA: 04/19/2020 PCP: Leonides Sake, MD    Brief Narrative:  69 year old male with history of mantle cell lymphoma, provoked VTE, hypertension and diabetes brought to ER from cancer center where he presented with complaints of fatigue, decreased oral intake and he was found to have hypercalcemia 12.3.  Was also complaining of abdominal distention and constipation. In the emergency room, he was afebrile.  He was tachycardic and tachypneic.  He was started on 2 L oxygen.  Creatinine 1.13 with previous creatinine of 0.8.  Calcium 12.3.  Chest x-ray with atelectasis.  CT scan with new large 15 cm lymphoid mass within the left upper quadrant new from recent PET scan and plenty of retroperitoneal lymphadenopathy and precordial adenopathy.  Omental thickening.  Pelvic fluid.  Admitted to the hospital with significant symptoms and for inpatient chemotherapy.   Assessment & Plan:   Principal Problem:   Hypercalcemia Active Problems:   HTN (hypertension)   Mantle cell lymphoma of lymph nodes of multiple regions (HCC)   CAP (community acquired pneumonia)   Diabetes mellitus without complication ([place])   Acute respiratory failure with hypoxia (HCC)   Sepsis ([place])   Acute diastolic CHF (congestive heart failure) ([place])   Encounter for antineoplastic chemotherapy  Hypercalcemia of malignancy: Also aggravated by dehydration. Treated with IV fluids with improvement. Suppressed parathyroid hormone levels consistent with hypercalcemia of malignancy. Normalized.  Encourage oral hydration.  SIRS: Present on admission.  Presented with tachycardia tachypnea leukocytosis, lactic acidosis and hypoxia.  No clear-cut source of infection. Negative cultures.   Initially thought to be pneumonia, ruled out.   Discontinue all antibiotics.    Mantle cell lymphoma with abdominal distention/metastatic disease: Patient with significant  lymphadenopathy, lymphoid mass with incomplete bowel obstruction. Unable to get fluid pocket for paracentesis. Bowel functions present.  Today with diarrhea. Patient received CHOP regimen for lymphoma on 10/30. Check uric acid and LDH level in the morning.  Hypernatremia: Due to free water deficit.  Started on dextrose half-normal saline.  Hypertension: Blood pressure stable on amlodipine and ramipril.  Type 2 diabetes: Stable on sliding scale.   DVT prophylaxis: enoxaparin (LOVENOX) injection 40 mg Start: 04/19/20 2200   Code Status: Full code Family Communication: None.  Patient declined provided to talk to other family.  He will talk to himself. Disposition Plan: Status is: Inpatient  Remains inpatient appropriate because:IV treatments appropriate due to intensity of illness or inability to take PO and Inpatient level of care appropriate due to severity of illness   Dispo: The patient is from: Home              Anticipated d/c is to: Home              Anticipated d/c date is: 2 to 3 days.              Patient currently is not medically stable to d/c.         Consultants:   Oncology  Procedures:   None  Antimicrobials:   Rocephin and azithromycin, 10/28>>> 10/31.   Subjective: Patient seen and examined.  Today he has large loose stool.  Felt tired.  Feels distention with some right-sided discomfort on movement.  Denies any nausea vomiting but appetite is overall poor.  Objective: Vitals:   04/21/20 0729 04/21/20 1303 04/21/20 2033 04/22/20 0557  BP: (!) 165/84 (!) 156/80 136/73 (!) 169/86  Pulse: 87 93 89 87  Resp:  18 16  Temp: 97.8 F (36.6 C) 97.6 F (36.4 C) 98.3 F (36.8 C) 97.9 F (36.6 C)  TempSrc: Oral Oral Oral Oral  SpO2: 93% 92% 92% 92%  Weight:      Height:        Intake/Output Summary (Last 24 hours) at 04/22/2020 1309 Last data filed at 04/22/2020 1250 Gross per 24 hour  Intake 240 ml  Output --  Net 240 ml   Filed Weights    04/19/20 1118 04/20/20 1936 04/21/20 0715  Weight: 95.3 kg 93.2 kg 95.8 kg    Examination:  General exam: Appears calm and comfortable. Respiratory system: Clear to auscultation. Respiratory effort normal.  No added sounds.  Comfortable on room air. Cardiovascular system: S1 & S2 heard, RRR. No JVD, murmurs, rubs, gallops or clicks. No pedal edema. Gastrointestinal system: Abdomen soft, distended, bowel sounds hyperactive.  Palpable mass on the left upper quadrant. No localized rigidity or guarding. Central nervous system: Alert and oriented. No focal neurological deficits. Extremities: Symmetric 5 x 5 power. Skin: No rashes, lesions or ulcers Psychiatry: Judgement and insight appear normal. Mood & affect appropriate.     Data Reviewed: I have personally reviewed following labs and imaging studies  CBC: Recent Labs  Lab 04/19/20 0922 04/19/20 1616 04/20/20 0348 04/21/20 0503 04/22/20 0642  WBC 11.3* 13.4* 14.1* 14.9* 14.0*  NEUTROABS 6.4 11.1*  --  10.3* 10.0*  HGB 12.2* 11.9* 10.8* 10.1* 10.4*  HCT 37.3* 37.0* 33.3* 32.1* 32.9*  MCV 85.7 87.3 88.1 90.2 90.6  PLT 197 203 171 182 209   Basic Metabolic Panel: Recent Labs  Lab 04/19/20 1132 04/19/20 1505 04/19/20 1616 04/20/20 0348 04/21/20 0503 04/22/20 0642  NA 146*  --  146* 143 144 146*  K 3.2*  --  3.1* 3.4* 3.3* 3.5  CL 102  --  102 101 104 106  CO2 30  --  26 26 28 25   GLUCOSE 186*  --  218* 211* 191* 178*  BUN 11  --  12 16 24* 27*  CREATININE 1.05  --  1.02 1.04 1.07 1.04  CALCIUM 12.4*  --  12.4* 11.0* 9.9 9.3  MG  --  2.2  --   --  2.1  --   PHOS  --   --   --   --  2.4*  --    GFR: Estimated Creatinine Clearance: 69 mL/min (by C-G formula based on SCr of 1.04 mg/dL). Liver Function Tests: Recent Labs  Lab 04/19/20 0922 04/19/20 1132 04/19/20 1616 04/21/20 0503 04/22/20 0642  AST 16 19 16 17 16   ALT 10 12 12 12 13   ALKPHOS 89 82 72 69 70  BILITOT 0.7 0.9 1.0 0.6 0.6  PROT 6.5 7.2 6.5 6.0*  6.0*  ALBUMIN 4.0 4.4 4.0 3.6 3.7   No results for input(s): LIPASE, AMYLASE in the last 168 hours. No results for input(s): AMMONIA in the last 168 hours. Coagulation Profile: No results for input(s): INR, PROTIME in the last 168 hours. Cardiac Enzymes: No results for input(s): CKTOTAL, CKMB, CKMBINDEX, TROPONINI in the last 168 hours. BNP (last 3 results) No results for input(s): PROBNP in the last 8760 hours. HbA1C: No results for input(s): HGBA1C in the last 72 hours. CBG: Recent Labs  Lab 04/21/20 1144 04/21/20 1616 04/21/20 2037 04/22/20 0806 04/22/20 1247  GLUCAP 196* 207* 288* 163* 169*   Lipid Profile: No results for input(s): CHOL, HDL, LDLCALC, TRIG, CHOLHDL, LDLDIRECT in the last 72 hours. Thyroid  Function Tests: No results for input(s): TSH, T4TOTAL, FREET4, T3FREE, THYROIDAB in the last 72 hours. Anemia Panel: No results for input(s): VITAMINB12, FOLATE, FERRITIN, TIBC, IRON, RETICCTPCT in the last 72 hours. Sepsis Labs: Recent Labs  Lab 04/19/20 1132 04/19/20 1345 04/19/20 1624 04/19/20 2040  LATICACIDVEN 2.3* 2.7* 1.9 1.6    Recent Results (from the past 240 hour(s))  Respiratory Panel by RT PCR (Flu A&B, Covid) - Nasopharyngeal Swab     Status: None   Collection Time: 04/19/20 11:53 AM   Specimen: Nasopharyngeal Swab  Result Value Ref Range Status   SARS Coronavirus 2 by RT PCR NEGATIVE NEGATIVE Final    Comment: (NOTE) SARS-CoV-2 target nucleic acids are NOT DETECTED.  The SARS-CoV-2 RNA is generally detectable in upper respiratoy specimens during the acute phase of infection. The lowest concentration of SARS-CoV-2 viral copies this assay can detect is 131 copies/mL. A negative result does not preclude SARS-Cov-2 infection and should not be used as the sole basis for treatment or other patient management decisions. A negative result may occur with  improper specimen collection/handling, submission of specimen other than nasopharyngeal swab,  presence of viral mutation(s) within the areas targeted by this assay, and inadequate number of viral copies (<131 copies/mL). A negative result must be combined with clinical observations, patient history, and epidemiological information. The expected result is Negative.  Fact Sheet for Patients:  PinkCheek.be  Fact Sheet for Healthcare Providers:  GravelBags.it  This test is no t yet approved or cleared by the [place] FDA and  has been authorized for detection and/or diagnosis of SARS-CoV-2 by FDA under an Emergency Use Authorization (EUA). This EUA will remain  in effect (meaning this test can be used) for the duration of the COVID-19 declaration under Section 564(b)(1) of the Act, 21 U.S.C. section 360bbb-3(b)(1), unless the authorization is terminated or revoked sooner.     Influenza A by PCR NEGATIVE NEGATIVE Final   Influenza B by PCR NEGATIVE NEGATIVE Final    Comment: (NOTE) The Xpert Xpress SARS-CoV-2/FLU/RSV assay is intended as an aid in  the diagnosis of influenza from Nasopharyngeal swab specimens and  should not be used as a sole basis for treatment. Nasal washings and  aspirates are unacceptable for Xpert Xpress SARS-CoV-2/FLU/RSV  testing.  Fact Sheet for Patients: PinkCheek.be  Fact Sheet for Healthcare Providers: GravelBags.it  This test is not yet approved or cleared by the [place] FDA and  has been authorized for detection and/or diagnosis of SARS-CoV-2 by  FDA under an Emergency Use Authorization (EUA). This EUA will remain  in effect (meaning this test can be used) for the duration of the  Covid-19 declaration under Section 564(b)(1) of the Act, 21  U.S.C. section 360bbb-3(b)(1), unless the authorization is  terminated or revoked. Performed at [facility], [place] [address], [place], [place] [address]     Culture, blood (x 2)     Status: None (Preliminary result)   Collection Time: 04/19/20  4:17 PM   Specimen: BLOOD  Result Value Ref Range Status   Specimen Description   Final    BLOOD LEFT ANTECUBITAL Performed at [facility] [address], [place], [place] [address]    Special Requests   Final    BOTTLES DRAWN AEROBIC ONLY Blood Culture adequate volume Performed at [facility] [address] [place],  [address]    Culture   Final    NO GROWTH 3 DAYS Performed at [facility], [place] [address], [place], [place] [address]    Report Status PENDING  Incomplete  Culture, blood (x 2)     Status: None (Preliminary result)   Collection Time: 04/19/20  4:24 PM   Specimen: BLOOD LEFT FOREARM  Result Value Ref Range Status   Specimen Description   Final    BLOOD LEFT FOREARM Performed at [facility] [address] [place], [place] [address]    Special Requests   Final    BOTTLES DRAWN AEROBIC ONLY Blood Culture adequate volume Performed at [facility] [address] [place], [place] [address]    Culture   Final    NO GROWTH 3 DAYS Performed at [facility], [place] [address] [place], [place] [address]    Report Status PENDING  Incomplete         Radiology Studies: No results found.      Scheduled Meds: . allopurinol  300 mg Oral Daily  . amLODipine  10 mg Oral Daily  . Chlorhexidine Gluconate Cloth  6 each Topical Daily  . enoxaparin (LOVENOX) injection  40 mg Subcutaneous Q24H  . feeding supplement (GLUCERNA SHAKE)  237 mL Oral TID BM  . insulin aspart  0-9 Units Subcutaneous TID WC  . pantoprazole  40 mg Oral Daily  . potassium chloride  20 mEq Oral BID  . predniSONE  80 mg Oral Q breakfast  . ramipril  10 mg Oral Daily  . sodium chloride flush  10-40 mL Intracatheter Q12H  . sodium chloride flush  3 mL Intravenous Q12H   Continuous Infusions: . dextrose 5 %  and 0.45% NaCl 75 mL/hr at 04/22/20 1249     LOS: 3 days    Time spent: 30 minutes    Barb Merino, MD [facility] Pager [phone]

## 2020-04-23 DIAGNOSIS — C8318 Mantle cell lymphoma, lymph nodes of multiple sites: Secondary | ICD-10-CM | POA: Diagnosis not present

## 2020-04-23 DIAGNOSIS — Z5111 Encounter for antineoplastic chemotherapy: Secondary | ICD-10-CM | POA: Diagnosis not present

## 2020-04-23 LAB — COMPREHENSIVE METABOLIC PANEL
ALT: 15 U/L (ref 0–44)
AST: 19 U/L (ref 15–41)
Albumin: 3.4 g/dL — ABNORMAL LOW (ref 3.5–5.0)
Alkaline Phosphatase: 64 U/L (ref 38–126)
Anion gap: 12 (ref 5–15)
BUN: 23 mg/dL (ref 8–23)
CO2: 24 mmol/L (ref 22–32)
Calcium: 8.5 mg/dL — ABNORMAL LOW (ref 8.9–10.3)
Chloride: 105 mmol/L (ref 98–111)
Creatinine, Ser: 0.99 mg/dL (ref 0.61–1.24)
GFR, Estimated: >60 mL/min (ref >60)
Glucose, Bld: 148 mg/dL — ABNORMAL HIGH (ref 70–99)
Potassium: 3.5 mmol/L (ref 3.5–5.1)
Sodium: 141 mmol/L (ref 135–145)
Total Bilirubin: 0.7 mg/dL (ref 0.3–1.2)
Total Protein: 5.6 g/dL — ABNORMAL LOW (ref 6.5–8.1)

## 2020-04-23 LAB — CBC WITH DIFFERENTIAL/PLATELET
Abs Immature Granulocytes: 0.51 10*3/uL — ABNORMAL HIGH (ref 0.00–0.07)
Basophils Absolute: 0 10*3/uL (ref 0.0–0.1)
Basophils Relative: 0 %
Eosinophils Absolute: 0 10*3/uL (ref 0.0–0.5)
Eosinophils Relative: 0 %
HCT: 34.2 % — ABNORMAL LOW (ref 39.0–52.0)
Hemoglobin: 10.6 g/dL — ABNORMAL LOW (ref 13.0–17.0)
Immature Granulocytes: 4 %
Lymphocytes Relative: 14 %
Lymphs Abs: 1.7 10*3/uL (ref 0.7–4.0)
MCH: 28.3 pg (ref 26.0–34.0)
MCHC: 31 g/dL (ref 30.0–36.0)
MCV: 91.2 fL (ref 80.0–100.0)
Monocytes Absolute: 1.3 10*3/uL — ABNORMAL HIGH (ref 0.1–1.0)
Monocytes Relative: 11 %
Neutro Abs: 8.7 10*3/uL — ABNORMAL HIGH (ref 1.7–7.7)
Neutrophils Relative %: 71 %
Platelets: 161 10*3/uL (ref 150–400)
RBC: 3.75 MIL/uL — ABNORMAL LOW (ref 4.22–5.81)
RDW: 17.6 % — ABNORMAL HIGH (ref 11.5–15.5)
WBC: 12.3 10*3/uL — ABNORMAL HIGH (ref 4.0–10.5)
nRBC: 0.2 % (ref 0.0–0.2)

## 2020-04-23 LAB — GLUCOSE, CAPILLARY
Glucose-Capillary: 122 mg/dL — ABNORMAL HIGH (ref 70–99)
Glucose-Capillary: 145 mg/dL — ABNORMAL HIGH (ref 70–99)

## 2020-04-23 LAB — LACTATE DEHYDROGENASE: LDH: 333 U/L — ABNORMAL HIGH (ref 98–192)

## 2020-04-23 LAB — URIC ACID: Uric Acid, Serum: 6.4 mg/dL (ref 3.7–8.6)

## 2020-04-23 MED ORDER — ALLOPURINOL 300 MG PO TABS: 300.0000 mg | ORAL_TABLET | Freq: Every day | ORAL | 0 refills | Status: AC

## 2020-04-23 NOTE — Care Management Important Message (Signed)
Important Message  Patient Details IM Letter given to the Patient Name: Dean Dixon MRN: [id number] Date of Birth: [birth year]   Medicare Important Message Given:  Yes     Kerin Salen 04/23/2020, 9:19 AM

## 2020-04-23 NOTE — Progress Notes (Signed)
Pt discharged home in stable condition. Discharge instructions given. Scripts sent to pharmacy of choice. No immediate questions or concerns at this time.  ?

## 2020-04-23 NOTE — Progress Notes (Addendum)
HEMATOLOGY-ONCOLOGY PROGRESS NOTE  SUBJECTIVE:  The patient reports that he feels somewhat better this morning.  He had diarrhea yesterday which has now resolved.  Denies nausea or vomiting.  Denies mucositis.  Abdominal distention is about the same.  He states that he would like to go home today.   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 ([place])   06/29/2019 - 10/20/2019 Chemotherapy   The patient had dexamethasone (DECADRON) 4 MG tablet, 8 mg, Oral, Daily, 1 of 1 cycle, Start date: 06/20/2019, End date: 11/17/2019 palonosetron (ALOXI) injection 0.25 mg, 0.25 mg, Intravenous,  Once, 5 of 5 cycles Administration: 0.25 mg (07/27/2019), 0.25 mg (06/30/2019), 0.25 mg (08/24/2019), 0.25 mg (10/20/2019), 0.25 mg (09/21/2019) pegfilgrastim (NEULASTA ONPRO KIT) injection 6 mg, 6 mg, Subcutaneous, Once, 3 of 3 cycles Administration: 6 mg (08/25/2019), 6 mg (09/22/2019) bendamustine (BENDEKA) 200 mg in sodium chloride 0.9 % 50 mL (3.4483 mg/mL) chemo infusion, 90 mg/m2 = 200 mg, Intravenous,  Once, 5 of 5 cycles Administration: 200 mg (06/29/2019), 200 mg (06/30/2019), 200 mg (07/27/2019), 200 mg (07/28/2019), 200 mg (08/24/2019), 200 mg (08/25/2019), 200 mg (09/21/2019), 200 mg (09/22/2019) riTUXimab-pvvr (RUXIENCE) 800 mg in sodium chloride 0.9 % 250 mL (2.4242 mg/mL) infusion, 375 mg/m2 = 800 mg, Intravenous,  Once, 5 of 5 cycles Dose modification: 375 mg/m2 (original dose 375 mg/m2, Cycle 2), 400 mg (original dose 375 mg/m2, Cycle 2, Reason: Other (see comments), Comment: finishing rituximab from previous day, infusion not completed), 300 mg (original dose 300 mg, Cycle 4) Administration: 800 mg (06/29/2019), 800 mg (07/27/2019), 800 mg (08/24/2019), 400 mg (07/28/2019), 800 mg (10/20/2019), 800 mg (09/21/2019), 300 mg (09/22/2019)  for chemotherapy treatment.    04/20/2020 -  Chemotherapy   The patient had  DOXOrubicin (ADRIAMYCIN) chemo injection 100 mg, 48 mg/m2 = 104 mg, Intravenous,  Once, 1 of 8 cycles Administration: 100 mg (04/21/2020) palonosetron (ALOXI) injection 0.25 mg, 0.25 mg, Intravenous,  Once, 1 of 8 cycles Administration: 0.25 mg (04/21/2020) pegfilgrastim-cbqv (UDENYCA) injection 6 mg, 6 mg, Subcutaneous, Once, 1 of 1 cycle vinCRIStine (ONCOVIN) 2 mg in sodium chloride 0.9 % 50 mL chemo infusion, 2 mg, Intravenous,  Once, 1 of 8 cycles Administration: 2 mg (04/21/2020) cyclophosphamide (CYTOXAN) 1,560 mg in sodium chloride 0.9 % 250 mL chemo infusion, 750 mg/m2 = 1,560 mg, Intravenous,  Once, 1 of 8 cycles Administration: 1,560 mg (04/21/2020) fosaprepitant (EMEND) 150 mg in sodium chloride 0.9 % 145 mL IVPB, 150 mg, Intravenous,  Once, 1 of 8 cycles Administration: 150 mg (04/21/2020)  for chemotherapy treatment.       REVIEW OF SYSTEMS:   ROS - no fevers/chills no SOB  PHYSICAL EXAMINATION: ECOG PERFORMANCE STATUS: 2 - Symptomatic, <50% confined to bed  Vitals:   04/22/20 1414 04/22/20 2013  BP: (!) 169/89 (!) 155/84  Pulse: 91 95  Resp: 20 18  Temp: 98 F (36.7 C) (!) 97.5 F (36.4 C)  SpO2: 92% 92%   Filed Weights   04/19/20 1118 04/20/20 1936 04/21/20 0715  Weight: 95.3 kg 93.2 kg 95.8 kg    Intake/Output from previous day: 10/31 0701 - 11/01 0700 In: 1310.3 [P.O.:240; I.V.:1070.3] Out: 100 [Urine:100]  GENERAL:alert, no distress and comfortable SKIN: skin color, texture, turgor are normal, no rashes or significant lesions EYES: normal, Conjunctiva are pink and non-injected, sclera clear OROPHARYNX:no exudate, no erythema and lips, buccal mucosa, and tongue  normal  LUNGS: clear to auscultation and percussion with normal breathing effort HEART: regular rate & rhythm and no murmurs and no lower extremity edema ABDOMEN: Significant abdominal distention Musculoskeletal:no cyanosis of digits and no clubbing  NEURO: alert & oriented x 3 with fluent  speech, no focal motor/sensory deficits  LABORATORY DATA:  I have reviewed the data as listed CMP Latest Ref Rng & Units 04/23/2020 04/22/2020 04/21/2020  Glucose 70 - 99 mg/dL 148(H) 178(H) 191(H)  BUN 8 - 23 mg/dL 23 27(H) 24(H)  Creatinine 0.61 - 1.24 mg/dL 0.99 1.04 1.07  Sodium 135 - 145 mmol/L 141 146(H) 144  Potassium 3.5 - 5.1 mmol/L 3.5 3.5 3.3(L)  Chloride 98 - 111 mmol/L 105 106 104  CO2 22 - 32 mmol/L $RemoveB'24 25 28  'eyAROJHY$ Calcium 8.9 - 10.3 mg/dL 8.5(L) 9.3 9.9  Total Protein 6.5 - 8.1 g/dL 5.6(L) 6.0(L) 6.0(L)  Total Bilirubin 0.3 - 1.2 mg/dL 0.7 0.6 0.6  Alkaline Phos 38 - 126 U/L 64 70 69  AST 15 - 41 U/L $Remo'19 16 17  'PpBxr$ ALT 0 - 44 U/L $Remo'15 13 12   'NepBd$ . CBC Latest Ref Rng & Units 04/23/2020 04/22/2020 04/21/2020  WBC 4.0 - 10.5 K/uL 12.3(H) 14.0(H) 14.9(H)  Hemoglobin 13.0 - 17.0 g/dL 10.6(L) 10.4(L) 10.1(L)  Hematocrit 39 - 52 % 34.2(L) 32.9(L) 32.1(L)  Platelets 150 - 400 K/uL 161 185 182    Lab Results  Component Value Date   WBC 12.3 (H) 04/23/2020   HGB 10.6 (L) 04/23/2020   HCT 34.2 (L) 04/23/2020   MCV 91.2 04/23/2020   PLT 161 04/23/2020   NEUTROABS 8.7 (H) 04/23/2020    CT ABDOMEN PELVIS WO CONTRAST  Addendum Date: 04/19/2020   ADDENDUM REPORT: 04/19/2020 12:54 ADDENDUM: The peritoneal fluid within the pelvis is not enhancing as this is a non IV contrast imaging exam. Therefore would described fluid collection as having thickened rim. Favor peritoneal metastasis. Findings conveyed toSCOTT GOLDSTON on 04/19/2020  at12:54. Electronically Signed   By: Suzy Bouchard M.D.   On: 04/19/2020 12:54   Result Date: 04/19/2020 CLINICAL DATA:  Abdominal pain.  Tachycardia.  Mantle cell lymphoma EXAM: CT ABDOMEN AND PELVIS WITHOUT CONTRAST TECHNIQUE: Multidetector CT imaging of the abdomen and pelvis was performed following the standard protocol without IV contrast. COMPARISON:  PET-CT scan 01/02/2020 FINDINGS: Lower chest: New small RIGHT effusion. No pericardial effusion. There is  new precordial adenopathy anterior to the RIGHT heart and liver (image 9/2). Hepatobiliary: New intraperitoneal free fluid along the margin of the RIGHT hepatic lobe. This fluid is simple attenuation. The liver appears unchanged on noncontrast exam. Large cyst the LEFT hepatic lobe. Gallbladder normal. Pancreas: Pancreas is grossly normal noncontrast exam Spleen: . spleen is unchanged Adrenals/urinary tract: Adrenal glands kidneys and ureters normal. Bladder Stomach/Bowel: Stomach duodenum normal. There is a new mass lesion in the LEFT upper quadrant measuring 14.2 by 13.0 cm. The mass may be associated the small bowel or the small bowel mesentery. The descending colon is also involving the mass (image 43/2. There is extensive fluid within the leaves of the mesentery of the distal small bowel. Appendix normal. Ascending transverse colon normal. The descending colon tracks along the new LEFT upper quadrant mass. Vascular/Lymphatic: New large LEFT upper quadrant mass either represents lymphoma involving the small bowel or mesentery. Mass is very large described the stomach section. Additionally there is new periportal adenopathy with enlarged lymph nodes along the aorta measuring up to 2 cm (image 44/2. There is nodularity  along the greater omentum abdominal on the LEFT. Reproductive: Prostate unremarkable Other: Small volume free fluid the pelvis. There is enhancing rim to the fluid (image 73/2 which could indicate peritonitis. Musculoskeletal: No aggressive osseous lesion. IMPRESSION: 1. Dominant finding is new large (15 cm) lymphoid mass within LEFT upper quadrant. Mass is new from PET-CT 01/02/2020. Differential includes massive mesenteric adenopathy versus lymphoma of the small bowel or descending colon. No oral or IV contrast. 2. New periaortic retroperitoneal lymphadenopathy. New precordial adenopathy. 3. Extensive fluid within the mesentery and omental thickening in LEFT lower quadrant. 4. Free fluid the  pelvis with thin enhancing rim suggest peritonitis. 5. No evidence of bowel perforation. No evidence of bowel obstruction. Electronically Signed: By: Suzy Bouchard M.D. On: 04/19/2020 12:39   CT CHEST WO CONTRAST  Result Date: 04/19/2020 CLINICAL DATA:  Pneumonia. Pleural effusion. Concern for metastases. EXAM: CT CHEST WITHOUT CONTRAST TECHNIQUE: Multidetector CT imaging of the chest was performed following the standard protocol without IV contrast. COMPARISON:  CT dated Nov 14, 2019 FINDINGS: Cardiovascular: The heart size is stable. Coronary artery calcifications are noted. There is no significant pericardial effusion. Mediastinum/Nodes: -- No mediastinal lymphadenopathy. -- No hilar lymphadenopathy. --there is left axillary adenopathy. This has progressed since the prior study. There is mild right axillary adenopathy, progressed from prior study. -- No supraclavicular lymphadenopathy. -- Normal thyroid gland where visualized. -there is mild diffuse esophageal wall thickening. Lungs/Pleura: There are trace to small bilateral pleural effusions, right greater than left. There is atelectasis at the right lung base. There is no pneumothorax. No area of consolidation concerning for pneumonia. Upper Abdomen: There is a partially visualized mass in the left upper quadrant, better visualized on the patient's prior CT. There is free fluid in the upper abdomen. Multiple enlarged pericardiophrenic lymph nodes are noted. There appears to be retroperitoneal adenopathy. Musculoskeletal: No chest wall abnormality. No bony spinal canal stenosis. IMPRESSION: 1. Trace to small bilateral pleural effusions, right greater than left. There is atelectasis at the right lung base. No area of consolidation concerning for pneumonia. 2. Mild diffuse esophageal wall thickening. Correlation with patient's symptoms is recommended. 3. Worsening axillary adenopathy. 4. Partially visualized mass in the left upper quadrant, better  visualized on the patient's prior CT. 5. Small volume ascites. Aortic Atherosclerosis (ICD10-I70.0). Electronically Signed   By: Constance Holster M.D.   On: 04/19/2020 20:37   DG Chest Portable 1 View  Result Date: 04/19/2020 CLINICAL DATA:  Cough, wheezing EXAM: PORTABLE CHEST 1 VIEW COMPARISON:  01/09/2020 FINDINGS: Low lung volumes. Minimal atelectasis or scarring at the left lung base. No pleural effusion or pneumothorax. Heart size is within normal limits for technique. IMPRESSION: Minimal atelectasis or scarring at the left lung base. Electronically Signed   By: Macy Mis M.D.   On: 04/19/2020 12:18   ECHOCARDIOGRAM COMPLETE  Result Date: 04/20/2020    ECHOCARDIOGRAM REPORT   Patient Name:   Dean Dixon Date of Exam: 04/20/2020 Medical Rec #:  [id number]    Height:       64.0 in Accession #:    [id number]   Weight:       210.1 lb Date of Birth:  [birth year]   BSA:          1.998 m Patient Age:    89 years     BP:           156/83 mmHg Patient Gender: M            HR:  88 bpm. Exam Location:  Inpatient Procedure: 2D Echo, Cardiac Doppler and Color Doppler Indications:    Chemo evaluation  History:        Patient has prior history of Echocardiogram examinations, most                 recent 11/14/2019. COPD; Risk Factors:Hypertension and Diabetes.                 Mantle cell lymphoma.  Sonographer:    Dustin Flock Referring Phys: Roy  1. Left ventricular ejection fraction, by estimation, is 65 to 70%. The left ventricle has normal function. The left ventricle has no regional wall motion abnormalities. Left ventricular diastolic parameters are consistent with Grade I diastolic dysfunction (impaired relaxation).  2. Right ventricular systolic function is normal. The right ventricular size is normal. Tricuspid regurgitation signal is inadequate for assessing PA pressure.  3. The mitral valve is grossly normal. Trivial mitral valve regurgitation. No evidence  of mitral stenosis.  4. The aortic valve is tricuspid. There is mild calcification of the aortic valve. There is mild thickening of the aortic valve. Aortic valve regurgitation is not visualized. Mild aortic valve sclerosis is present, with no evidence of aortic valve stenosis.  5. The inferior vena cava is normal in size with greater than 50% respiratory variability, suggesting right atrial pressure of 3 mmHg. Comparison(s): Changes from prior study are noted. EF is now 65-70%. FINDINGS  Left Ventricle: Left ventricular ejection fraction, by estimation, is 65 to 70%. The left ventricle has normal function. The left ventricle has no regional wall motion abnormalities. The left ventricular internal cavity size was normal in size. There is  no left ventricular hypertrophy. Left ventricular diastolic parameters are consistent with Grade I diastolic dysfunction (impaired relaxation). Normal left ventricular filling pressure. Right Ventricle: The right ventricular size is normal. No increase in right ventricular wall thickness. Right ventricular systolic function is normal. Tricuspid regurgitation signal is inadequate for assessing PA pressure. Left Atrium: Left atrial size was normal in size. Right Atrium: Right atrial size was normal in size. Pericardium: Trivial pericardial effusion is present. Presence of pericardial fat pad. Mitral Valve: The mitral valve is grossly normal. Trivial mitral valve regurgitation. No evidence of mitral valve stenosis. Tricuspid Valve: The tricuspid valve is grossly normal. Tricuspid valve regurgitation is not demonstrated. No evidence of tricuspid stenosis. Aortic Valve: The aortic valve is tricuspid. There is mild calcification of the aortic valve. There is mild thickening of the aortic valve. Aortic valve regurgitation is not visualized. Mild aortic valve sclerosis is present, with no evidence of aortic valve stenosis. Aortic valve mean gradient measures 11.1 mmHg. Aortic valve peak  gradient measures 16.7 mmHg. Aortic valve area, by VTI measures 2.42 cm. Pulmonic Valve: The pulmonic valve was grossly normal. Pulmonic valve regurgitation is not visualized. No evidence of pulmonic stenosis. Aorta: The aortic root is normal in size and structure. Venous: The right upper pulmonary vein is normal. The inferior vena cava is normal in size with greater than 50% respiratory variability, suggesting right atrial pressure of 3 mmHg. IAS/Shunts: The atrial septum is grossly normal. Additional Comments: There is a small pleural effusion in the left lateral region.  LEFT VENTRICLE PLAX 2D LVIDd:         4.30 cm  Diastology LVIDs:         2.60 cm  LV e' medial:    8.81 cm/s LV PW:  1.10 cm  LV E/e' medial:  7.2 LV IVS:        1.10 cm  LV e' lateral:   7.51 cm/s LVOT diam:     2.30 cm  LV E/e' lateral: 8.4 LV SV:         92 LV SV Index:   46 LVOT Area:     4.15 cm  RIGHT VENTRICLE RV Basal diam:  3.20 cm RV S prime:     11.00 cm/s TAPSE (M-mode): 2.9 cm LEFT ATRIUM             Index       RIGHT ATRIUM           Index LA diam:        3.30 cm 1.65 cm/m  RA Area:     15.30 cm LA Vol (A2C):   43.2 ml 21.62 ml/m RA Volume:   37.60 ml  18.82 ml/m LA Vol (A4C):   30.7 ml 15.37 ml/m LA Biplane Vol: 36.9 ml 18.47 ml/m  AORTIC VALVE AV Area (Vmax):    2.42 cm AV Area (Vmean):   2.14 cm AV Area (VTI):     2.42 cm AV Vmax:           204.20 cm/s AV Vmean:          159.222 cm/s AV VTI:            0.381 m AV Peak Grad:      16.7 mmHg AV Mean Grad:      11.1 mmHg LVOT Vmax:         119.00 cm/s LVOT Vmean:        82.200 cm/s LVOT VTI:          0.222 m LVOT/AV VTI ratio: 0.58  AORTA Ao Root diam: 3.10 cm MITRAL VALVE MV Area (PHT): 4.06 cm     SHUNTS MV Decel Time: 187 msec     Systemic VTI:  0.22 m MV E velocity: 63.40 cm/s   Systemic Diam: 2.30 cm MV A velocity: 104.00 cm/s MV E/A ratio:  0.61 Eleonore Chiquito MD Electronically signed by Eleonore Chiquito MD Signature Date/Time: 04/20/2020/10:32:25 AM    Final     [place] ASCITES (ABDOMEN LIMITED)  Result Date: 04/19/2020 CLINICAL DATA:  69 year old male with abdominal distension, ascites EXAM: LIMITED ABDOMEN ULTRASOUND FOR ASCITES TECHNIQUE: Limited ultrasound survey for ascites was performed in all four abdominal quadrants. COMPARISON:  CT abdomen/pelvis 04/19/2020 FINDINGS: Sonographic interrogation of the abdomen was performed in anticipation of paracentesis. Unfortunately, the volume is a ascites is quite small and insufficient for drainage. There is however extensive omental caking and peritoneal nodularity. IMPRESSION: 1. Small volume ascites, insufficient for paracentesis. 2. Extensive omental caking and peritoneal nodularity consistent with peritoneal carcinomatosis. Electronically Signed   By: Jacqulynn Cadet M.D.   On: 04/19/2020 16:53   [place] EKG SITE RITE  Result Date: 04/19/2020 If Site Rite image not attached, placement could not be confirmed due to current cardiac rhythm.   ASSESSMENT AND PLAN: 69 yo male with   1) Stage IV Mantle cell cell lymphoma CLL FISH shows 17p mutation -04/26/2019 Flow Cytometry [phone]) revealed "Abnormal B-cell population identified. CD5+ clonal lymphoproliferative disorder" -04/26/2019 FISH revealed "Positive for p53 (17p13) deletion." -05/31/2019 PET/CT ([id number]) revealed "1. There are multiple prominent lymph nodes in the neck, both axilla, the retroperitoneum and pelvis with low level hypermetabolic activity consistent with chronic lymphocytic leukemia. Deauville 3 and 4. 2. Mild splenomegaly and mild generalized splenic  hypermetabolic activity 3. No evidence of bone marrow involvement." -06/10/2019 Flow Pathology Report ([id number]) revealed "Abnormal B-cell population." -06/10/2019 Surgical Pathology Report ([id number])  revealed "Atypical lymphoid proliferation consistent with mantle cell lymphoma." -09/19/19 PET Skull Base to Thigh ([id number])-- favorable response to current  treatment -01/02/2020 PET/CT ([id number]) revealed "1. Continued further decrease in size and FDG accumulation in index cervical, axillary, retroperitoneal, and pelvic lymph nodes. No lymphadenopathy by size criteria today. FDG accumulation in these lymph nodes is compatible with Deauville 2 category. 2. Interval decrease in size with resolution of hypermetabolism associated with the spleen previously."  -04/19/2020 CT abdomen pelvis revealed "1. Dominant finding is new large (15 cm) lymphoid mass within LEFT upper quadrant. Mass is new from PET-CT 01/02/2020. Differential includes massive mesenteric adenopathy versus lymphoma of the small bowel or descending colon. No oral or IV contrast. 2. New periaortic retroperitoneal lymphadenopathy. New precordial Adenopathy. 3. Extensive fluid within the mesentery and omental thickening in LEFT lower quadrant. 4. Free fluid the pelvis with thin enhancing rim suggest peritonitis. 5. No evidence of bowel perforation. No evidence of bowel Obstruction."  2) Rigors from Rituxan - needing demerol as premedication. Rituxan rate to be capped at $RemoveB'150mg'ivwsUFUk$ /hour  3) Thrombocytopenia from chemotherapy  4) Neutropenia ? Drug related. ? Idiosyncratic reaction to Rituxan  5) Rituxan related ILD  6) significant new abdominal distention due to lymphoma recurrence  7) new hypercalcemia- ?  Related to dehydration and  Lymphoma -    PLAN: -Tolerated first cycle of CHOP well overall.  Today is day 3 of cycle 1.  Labs from today have been reviewed.  Hypercalcemia has resolved.  He has mild leukocytosis due to prednisone and mild anemia which is overall stable.  Uric acid level remains normal.  The patient will need G-CSF 11/2 at the cancer center. -The patient should receive prednisone 80 mg today and then stop. -Continue allopurinol 300 mg daily. -Okay to DC PICC line. -no clinical evidence of bowel obstruction.  Had bowel movement over the weekend. -depending on  response and tolerance to CHOP C1 will determine options of proceeding with additional cycles of CHOP vs switching to Acalabrutinib (given anticipated chemotherapy resistance with 17p in his mantle cell lymphoma) -patient agreeable with referral to [facility] for 2nd opinion given 17p mantle cell lymphoma to determine options for clinical trials vs candidacy for cellular therapies for consolidation if he can achieve CR2 or for salvage. -appreciate excellent hospital medicine care -From our standpoint, okay to discharge to home if otherwise medically stable.  I have sent a high priority scheduling message to the cancer center schedulers to arrange for G-CSF injection on 11/2 and for labs and a follow-up visit with Dr. Irene Limbo within 1 week.   LOS: 4 days   Mikey Bussing  ADDENDUM  .Patient was Personally and independently interviewed, examined and relevant elements of the history of present illness were reviewed in details and an assessment and plan was created. All elements of the patient's history of present illness , assessment and plan were discussed in details with Mikey Bussing. The above documentation reflects our combined findings assessment and plan.  Patient feeling a little better with resolution of hypercalcemia and some improvement in his po fluid intake. Will discharge to home today. PICC Line will need to be removed prior to discharge Udenyca/G-CSF tomorrow at cancer center RTC with Dr Irene Limbo in 1 week with labs for toxicity check If counts stable and rapidly improving symptoms with CHOP will pursue addition 1-3 cycles of CHOP,  alternatively will switching to Acalabrutinib sooner given concerns for chemotherapy resistance with 17p mutated MCL.  Sullivan Lone MD MS

## 2020-04-23 NOTE — TOC Transition Note (Addendum)
Transition of Care [facility]) - CM/SW Discharge Note   Patient Details  Name: Desmond Szabo MRN: [id number] Date of Birth: 1950/07/19  [facility]) CM/SW Contact:  Lia Hopping, [place] Phone Number: 04/23/2020, 1:04 PM   Clinical Narrative:    Patient declines home health services for PT/OT. Patient reports he is up walking in the room without support. Patient declines RW and additional DME. Patient reports his friend plans to stay with him and transport him to his appointment at the [facility] tomorrow.  No needs identified. CSW will sign off.   Final next level of care: Home/Self Care Barriers to Discharge: No Barriers Identified   Patient Goals and CMS Choice Patient states their goals for this hospitalization and ongoing recovery are:: To get better      Discharge Placement                       Discharge Plan and Services                                     Social Determinants of Health (SDOH) Interventions     Readmission Risk Interventions No flowsheet data found.

## 2020-04-23 NOTE — Discharge Summary (Signed)
Physician Discharge Summary  Nixon Kolton XIP:[id number] DOB: Feb 24, 1951 DOA: 04/19/2020  PCP: Leonides Sake, MD  Admit date: 04/19/2020 Discharge date: 04/23/2020  Admitted From: Home Disposition: Home  Recommendations for Outpatient Follow-up:  1. Follow-up to [facility] as a scheduled.   Home Health: Not applicable Equipment/Devices: Not applicable  Discharge Condition: Stable CODE STATUS: Full code Diet recommendation: Low-salt and low-carb diet, adequate hydration.  Small frequent meals.  Discharge summary: 69 year old male with history of mantle cell lymphoma, provoked VTE, hypertension and diabetes brought to ER from cancer center where he presented with complaints of fatigue, decreased oral intake and he was found to have hypercalcemia of 12.3.  Was also complaining of abdominal distention and constipation. In the emergency room, he was afebrile.  He was tachycardic and tachypneic.  He was started on 2 L oxygen.  Creatinine 1.13 with previous creatinine of 0.8.  Calcium 12.3.  Chest x-ray with atelectasis.  CT scan with new large 15 cm lymphoid mass within the left upper quadrant new from recent PET scan and plenty of retroperitoneal lymphadenopathy and precordial adenopathy.  Omental thickening.  Pelvic fluid.  Admitted to the hospital with significant symptoms and for inpatient chemotherapy. Hypercalcemia of malignancy: Also aggravated by dehydration. Treated with IV fluids with improvement. Suppressed parathyroid hormone levels consistent with hypercalcemia of malignancy. Normalized.  Encourage oral hydration.  #1. SIRS: Present on admission.  Presented with tachycardia, tachypnea, leukocytosis, lactic acidosis and hypoxia.    No source of infection.  Negative cultures.  Initially thought to be pneumonia but ruled out. Sepsis ruled out.  #2 Mantle cell lymphoma with abdominal distention/metastatic disease: Patient with significant lymphadenopathy, lymphoid  mass with incomplete bowel obstruction. Unable to get fluid pocket for paracentesis. Patient received CHOP regimen for lymphoma on 10/30. Check uric acid and LDH level normal. Monitored in the hospital.  Did well. Going home with allopurinol.  Scheduled follow-up tomorrow at cancer center. He has adequate bowel function.  Matter fact he had some diarrhea that has settled now. He has lymphadenopathy and left upper quadrant mass causing pressure on the intestines but not causing any bowel obstruction.  #3 hypertension: Blood pressure stable on amlodipine and ramipril.  #4 type 2 diabetes: Stable on nightly maintenance long-acting insulin 10 units at night.  Patient is fairly stable today.  He has received chemotherapy and has been uneventful except some diarrhea.  As per oncology plan, will discharge him today with a scheduled follow-up 11/2 at cancer center.   Discharge Diagnoses:  Principal Problem:   Hypercalcemia Active Problems:   HTN (hypertension)   Mantle cell lymphoma of lymph nodes of multiple regions (HCC)   CAP (community acquired pneumonia)   Diabetes mellitus without complication ([place])   Acute respiratory failure with hypoxia (HCC)   Sepsis ([place])   Acute diastolic CHF (congestive heart failure) ([place])   Encounter for antineoplastic chemotherapy    Discharge Instructions  Discharge Instructions    Call MD for:  persistant nausea and vomiting   Complete by: As directed    Call MD for:  severe uncontrolled pain   Complete by: As directed    Diet - low sodium heart healthy   Complete by: As directed    Diet Carb Modified   Complete by: As directed    Discharge instructions   Complete by: As directed    Eat small frequent meals, hydrate yourself well   Increase activity slowly   Complete by: As directed  Allergies as of 04/23/2020      Reactions   Ivp Dye [iodinated Diagnostic Agents] Shortness Of Breath   "allergic to CT scan dye", took benadryl with  last scan and tolerated well      Medication List    STOP taking these medications   pantoprazole 40 MG tablet Commonly known as: PROTONIX   Zithromax Z-Pak 250 MG tablet Generic drug: azithromycin     TAKE these medications   allopurinol 300 MG tablet Commonly known as: ZYLOPRIM Take 1 tablet (300 mg total) by mouth daily. Start taking on: April 24, 2020   amLODipine 10 MG tablet Commonly known as: NORVASC Take 10 mg by mouth daily.   blood glucose meter kit and supplies Check blood sugar 4 times daily (Before breakfast, lunch, dinner, bedtime). (FOR ICD-10 E10.9, E11.9).   COQ-10 PO Take 1 capsule by mouth daily. Takes CoQ 10 w/cinnamon   GLUCOSAMINE CHONDR COMPLEX PO Take 1 tablet by mouth daily.   insulin detemir 100 UNIT/ML FlexPen Commonly known as: LEVEMIR Inject 10 Units into the skin at bedtime for 5 days. What changed: how much to take   Insulin Pen Needle 31G X 5 MM Misc BD Pen Needles- brand specific Inject insulin via insulin pen 6 x daily   multivitamin tablet Take 1 tablet by mouth daily.   ramipril 10 MG capsule Commonly known as: ALTACE Take 10 mg by mouth daily.   Vitamin C 500 MG Caps Take 1 capsule by mouth daily.       Allergies  Allergen Reactions  . Ivp Dye [Iodinated Diagnostic Agents] Shortness Of Breath    "allergic to CT scan dye", took benadryl with last scan and tolerated well    Consultations:  Oncology   Procedures/Studies: CT ABDOMEN PELVIS WO CONTRAST  Addendum Date: 04/19/2020   ADDENDUM REPORT: 04/19/2020 12:54 ADDENDUM: The peritoneal fluid within the pelvis is not enhancing as this is a non IV contrast imaging exam. Therefore would described fluid collection as having thickened rim. Favor peritoneal metastasis. Findings conveyed toSCOTT GOLDSTON on 04/19/2020  at12:54. Electronically Signed   By: Stewart  Edmunds M.D.   On: 04/19/2020 12:54   Result Date: 04/19/2020 CLINICAL DATA:  Abdominal pain.   Tachycardia.  Mantle cell lymphoma EXAM: CT ABDOMEN AND PELVIS WITHOUT CONTRAST TECHNIQUE: Multidetector CT imaging of the abdomen and pelvis was performed following the standard protocol without IV contrast. COMPARISON:  PET-CT scan 01/02/2020 FINDINGS: Lower chest: New small RIGHT effusion. No pericardial effusion. There is new precordial adenopathy anterior to the RIGHT heart and liver (image 9/2). Hepatobiliary: New intraperitoneal free fluid along the margin of the RIGHT hepatic lobe. This fluid is simple attenuation. The liver appears unchanged on noncontrast exam. Large cyst the LEFT hepatic lobe. Gallbladder normal. Pancreas: Pancreas is grossly normal noncontrast exam Spleen: . spleen is unchanged Adrenals/urinary tract: Adrenal glands kidneys and ureters normal. Bladder Stomach/Bowel: Stomach duodenum normal. There is a new mass lesion in the LEFT upper quadrant measuring 14.2 by 13.0 cm. The mass may be associated the small bowel or the small bowel mesentery. The descending colon is also involving the mass (image 43/2. There is extensive fluid within the leaves of the mesentery of the distal small bowel. Appendix normal. Ascending transverse colon normal. The descending colon tracks along the new LEFT upper quadrant mass. Vascular/Lymphatic: New large LEFT upper quadrant mass either represents lymphoma involving the small bowel or mesentery. Mass is very large described the stomach section. Additionally there is new periportal adenopathy   with enlarged lymph nodes along the aorta measuring up to 2 cm (image 44/2. There is nodularity along the greater omentum abdominal on the LEFT. Reproductive: Prostate unremarkable Other: Small volume free fluid the pelvis. There is enhancing rim to the fluid (image 73/2 which could indicate peritonitis. Musculoskeletal: No aggressive osseous lesion. IMPRESSION: 1. Dominant finding is new large (15 cm) lymphoid mass within LEFT upper quadrant. Mass is new from PET-CT  01/02/2020. Differential includes massive mesenteric adenopathy versus lymphoma of the small bowel or descending colon. No oral or IV contrast. 2. New periaortic retroperitoneal lymphadenopathy. New precordial adenopathy. 3. Extensive fluid within the mesentery and omental thickening in LEFT lower quadrant. 4. Free fluid the pelvis with thin enhancing rim suggest peritonitis. 5. No evidence of bowel perforation. No evidence of bowel obstruction. Electronically Signed: By: Suzy Bouchard M.D. On: 04/19/2020 12:39   CT CHEST WO CONTRAST  Result Date: 04/19/2020 CLINICAL DATA:  Pneumonia. Pleural effusion. Concern for metastases. EXAM: CT CHEST WITHOUT CONTRAST TECHNIQUE: Multidetector CT imaging of the chest was performed following the standard protocol without IV contrast. COMPARISON:  CT dated Nov 14, 2019 FINDINGS: Cardiovascular: The heart size is stable. Coronary artery calcifications are noted. There is no significant pericardial effusion. Mediastinum/Nodes: -- No mediastinal lymphadenopathy. -- No hilar lymphadenopathy. --there is left axillary adenopathy. This has progressed since the prior study. There is mild right axillary adenopathy, progressed from prior study. -- No supraclavicular lymphadenopathy. -- Normal thyroid gland where visualized. -there is mild diffuse esophageal wall thickening. Lungs/Pleura: There are trace to small bilateral pleural effusions, right greater than left. There is atelectasis at the right lung base. There is no pneumothorax. No area of consolidation concerning for pneumonia. Upper Abdomen: There is a partially visualized mass in the left upper quadrant, better visualized on the patient's prior CT. There is free fluid in the upper abdomen. Multiple enlarged pericardiophrenic lymph nodes are noted. There appears to be retroperitoneal adenopathy. Musculoskeletal: No chest wall abnormality. No bony spinal canal stenosis. IMPRESSION: 1. Trace to small bilateral pleural  effusions, right greater than left. There is atelectasis at the right lung base. No area of consolidation concerning for pneumonia. 2. Mild diffuse esophageal wall thickening. Correlation with patient's symptoms is recommended. 3. Worsening axillary adenopathy. 4. Partially visualized mass in the left upper quadrant, better visualized on the patient's prior CT. 5. Small volume ascites. Aortic Atherosclerosis (ICD10-I70.0). Electronically Signed   By: Constance Holster M.D.   On: 04/19/2020 20:37   DG Chest Portable 1 View  Result Date: 04/19/2020 CLINICAL DATA:  Cough, wheezing EXAM: PORTABLE CHEST 1 VIEW COMPARISON:  01/09/2020 FINDINGS: Low lung volumes. Minimal atelectasis or scarring at the left lung base. No pleural effusion or pneumothorax. Heart size is within normal limits for technique. IMPRESSION: Minimal atelectasis or scarring at the left lung base. Electronically Signed   By: Macy Mis M.D.   On: 04/19/2020 12:18   ECHOCARDIOGRAM COMPLETE  Result Date: 04/20/2020    ECHOCARDIOGRAM REPORT   Patient Name:   KENDEL BESSEY Date of Exam: 04/20/2020 Medical Rec #:  [id number]    Height:       64.0 in Accession #:    [id number]   Weight:       210.1 lb Date of Birth:  1950/12/03   BSA:          1.998 m Patient Age:    69 years     BP:           156/83 mmHg  Patient Gender: M            HR:           88 bpm. Exam Location:  Inpatient Procedure: 2D Echo, Cardiac Doppler and Color Doppler Indications:    Chemo evaluation  History:        Patient has prior history of Echocardiogram examinations, most                 recent 11/14/2019. COPD; Risk Factors:Hypertension and Diabetes.                 Mantle cell lymphoma.  Sonographer:    Dustin Flock Referring Phys: [place]  1. Left ventricular ejection fraction, by estimation, is 65 to 70%. The left ventricle has normal function. The left ventricle has no regional wall motion abnormalities. Left ventricular diastolic  parameters are consistent with Grade I diastolic dysfunction (impaired relaxation).  2. Right ventricular systolic function is normal. The right ventricular size is normal. Tricuspid regurgitation signal is inadequate for assessing PA pressure.  3. The mitral valve is grossly normal. Trivial mitral valve regurgitation. No evidence of mitral stenosis.  4. The aortic valve is tricuspid. There is mild calcification of the aortic valve. There is mild thickening of the aortic valve. Aortic valve regurgitation is not visualized. Mild aortic valve sclerosis is present, with no evidence of aortic valve stenosis.  5. The inferior vena cava is normal in size with greater than 50% respiratory variability, suggesting right atrial pressure of 3 mmHg. Comparison(s): Changes from prior study are noted. EF is now 65-70%. FINDINGS  Left Ventricle: Left ventricular ejection fraction, by estimation, is 65 to 70%. The left ventricle has normal function. The left ventricle has no regional wall motion abnormalities. The left ventricular internal cavity size was normal in size. There is  no left ventricular hypertrophy. Left ventricular diastolic parameters are consistent with Grade I diastolic dysfunction (impaired relaxation). Normal left ventricular filling pressure. Right Ventricle: The right ventricular size is normal. No increase in right ventricular wall thickness. Right ventricular systolic function is normal. Tricuspid regurgitation signal is inadequate for assessing PA pressure. Left Atrium: Left atrial size was normal in size. Right Atrium: Right atrial size was normal in size. Pericardium: Trivial pericardial effusion is present. Presence of pericardial fat pad. Mitral Valve: The mitral valve is grossly normal. Trivial mitral valve regurgitation. No evidence of mitral valve stenosis. Tricuspid Valve: The tricuspid valve is grossly normal. Tricuspid valve regurgitation is not demonstrated. No evidence of tricuspid stenosis.  Aortic Valve: The aortic valve is tricuspid. There is mild calcification of the aortic valve. There is mild thickening of the aortic valve. Aortic valve regurgitation is not visualized. Mild aortic valve sclerosis is present, with no evidence of aortic valve stenosis. Aortic valve mean gradient measures 11.1 mmHg. Aortic valve peak gradient measures 16.7 mmHg. Aortic valve area, by VTI measures 2.42 cm. Pulmonic Valve: The pulmonic valve was grossly normal. Pulmonic valve regurgitation is not visualized. No evidence of pulmonic stenosis. Aorta: The aortic root is normal in size and structure. Venous: The right upper pulmonary vein is normal. The inferior vena cava is normal in size with greater than 50% respiratory variability, suggesting right atrial pressure of 3 mmHg. IAS/Shunts: The atrial septum is grossly normal. Additional Comments: There is a small pleural effusion in the left lateral region.  LEFT VENTRICLE PLAX 2D LVIDd:         4.30 cm  Diastology LVIDs:  2.60 cm  LV e' medial:    8.81 cm/s LV PW:         1.10 cm  LV E/e' medial:  7.2 LV IVS:        1.10 cm  LV e' lateral:   7.51 cm/s LVOT diam:     2.30 cm  LV E/e' lateral: 8.4 LV SV:         92 LV SV Index:   46 LVOT Area:     4.15 cm  RIGHT VENTRICLE RV Basal diam:  3.20 cm RV S prime:     11.00 cm/s TAPSE (M-mode): 2.9 cm LEFT ATRIUM             Index       RIGHT ATRIUM           Index LA diam:        3.30 cm 1.65 cm/m  RA Area:     15.30 cm LA Vol (A2C):   43.2 ml 21.62 ml/m RA Volume:   37.60 ml  18.82 ml/m LA Vol (A4C):   30.7 ml 15.37 ml/m LA Biplane Vol: 36.9 ml 18.47 ml/m  AORTIC VALVE AV Area (Vmax):    2.42 cm AV Area (Vmean):   2.14 cm AV Area (VTI):     2.42 cm AV Vmax:           204.20 cm/s AV Vmean:          159.222 cm/s AV VTI:            0.381 m AV Peak Grad:      16.7 mmHg AV Mean Grad:      11.1 mmHg LVOT Vmax:         119.00 cm/s LVOT Vmean:        82.200 cm/s LVOT VTI:          0.222 m LVOT/AV VTI ratio: 0.58  AORTA  Ao Root diam: 3.10 cm MITRAL VALVE MV Area (PHT): 4.06 cm     SHUNTS MV Decel Time: 187 msec     Systemic VTI:  0.22 m MV E velocity: 63.40 cm/s   Systemic Diam: 2.30 cm MV A velocity: 104.00 cm/s MV E/A ratio:  0.61 Eleonore Chiquito MD Electronically signed by Eleonore Chiquito MD Signature Date/Time: 04/20/2020/10:32:25 AM    Final    [place] ASCITES (ABDOMEN LIMITED)  Result Date: 04/19/2020 CLINICAL DATA:  69 year old male with abdominal distension, ascites EXAM: LIMITED ABDOMEN ULTRASOUND FOR ASCITES TECHNIQUE: Limited ultrasound survey for ascites was performed in all four abdominal quadrants. COMPARISON:  CT abdomen/pelvis 04/19/2020 FINDINGS: Sonographic interrogation of the abdomen was performed in anticipation of paracentesis. Unfortunately, the volume is a ascites is quite small and insufficient for drainage. There is however extensive omental caking and peritoneal nodularity. IMPRESSION: 1. Small volume ascites, insufficient for paracentesis. 2. Extensive omental caking and peritoneal nodularity consistent with peritoneal carcinomatosis. Electronically Signed   By: Jacqulynn Cadet M.D.   On: 04/19/2020 16:53   [place] EKG SITE RITE  Result Date: 04/19/2020 If Site Rite image not attached, placement could not be confirmed due to current cardiac rhythm.   (Echo, Carotid, EGD, Colonoscopy, ERCP)    Subjective: Patient seen and examined.  No overnight events.  Still has some dull abdominal pain mostly on mobility otherwise denies any nausea or vomiting.  He was able to eat small frequent meals.  He had some diarrhea yesterday that has settled down and no more loose stool since last night.  Remains  afebrile.  Eager to go home.   Discharge Exam: Vitals:   04/22/20 1414 04/22/20 2013  BP: (!) 169/89 (!) 155/84  Pulse: 91 95  Resp: 20 18  Temp: 98 F (36.7 C) (!) 97.5 F (36.4 C)  SpO2: 92% 92%   Vitals:   04/21/20 2033 04/22/20 0557 04/22/20 1414 04/22/20 2013  BP: 136/73 (!) 169/86 (!) 169/89  (!) 155/84  Pulse: 89 87 91 95  Resp: 18 16 20 18  Temp: 98.3 F (36.8 C) 97.9 F (36.6 C) 98 F (36.7 C) (!) 97.5 F (36.4 C)  TempSrc: Oral Oral Oral Oral  SpO2: 92% 92% 92% 92%  Weight:      Height:        General: Pt is alert, awake, not in acute distress Cardiovascular: RRR, S1/S2 +, no rubs, no gallops Respiratory: CTA bilaterally, no wheezing, no rhonchi Abdominal: Soft, distended.  Palpable mass on the left upper quadrant. Extremities: no edema, no cyanosis    The results of significant diagnostics from this hospitalization (including imaging, microbiology, ancillary and laboratory) are listed below for reference.     Microbiology: Recent Results (from the past 240 hour(s))  Respiratory Panel by RT PCR (Flu A&B, Covid) - Nasopharyngeal Swab     Status: None   Collection Time: 04/19/20 11:53 AM   Specimen: Nasopharyngeal Swab  Result Value Ref Range Status   SARS Coronavirus 2 by RT PCR NEGATIVE NEGATIVE Final    Comment: (NOTE) SARS-CoV-2 target nucleic acids are NOT DETECTED.  The SARS-CoV-2 RNA is generally detectable in upper respiratoy specimens during the acute phase of infection. The lowest concentration of SARS-CoV-2 viral copies this assay can detect is 131 copies/mL. A negative result does not preclude SARS-Cov-2 infection and should not be used as the sole basis for treatment or other patient management decisions. A negative result may occur with  improper specimen collection/handling, submission of specimen other than nasopharyngeal swab, presence of viral mutation(s) within the areas targeted by this assay, and inadequate number of viral copies (<131 copies/mL). A negative result must be combined with clinical observations, patient history, and epidemiological information. The expected result is Negative.  Fact Sheet for Patients:  https://www.fda.gov/media/142436/download  Fact Sheet for Healthcare Providers:   https://www.fda.gov/media/142435/download  This test is no t yet approved or cleared by the [place] FDA and  has been authorized for detection and/or diagnosis of SARS-CoV-2 by FDA under an Emergency Use Authorization (EUA). This EUA will remain  in effect (meaning this test can be used) for the duration of the COVID-19 declaration under Section 564(b)(1) of the Act, 21 U.S.C. section 360bbb-3(b)(1), unless the authorization is terminated or revoked sooner.     Influenza A by PCR NEGATIVE NEGATIVE Final   Influenza B by PCR NEGATIVE NEGATIVE Final    Comment: (NOTE) The Xpert Xpress SARS-CoV-2/FLU/RSV assay is intended as an aid in  the diagnosis of influenza from Nasopharyngeal swab specimens and  should not be used as a sole basis for treatment. Nasal washings and  aspirates are unacceptable for Xpert Xpress SARS-CoV-2/FLU/RSV  testing.  Fact Sheet for Patients: https://www.fda.gov/media/142436/download  Fact Sheet for Healthcare Providers: https://www.fda.gov/media/142435/download  This test is not yet approved or cleared by the [place] FDA and  has been authorized for detection and/or diagnosis of SARS-CoV-2 by  FDA under an Emergency Use Authorization (EUA). This EUA will remain  in effect (meaning this test can be used) for the duration of the  Covid-19 declaration under Section 564(b)(1) of   the Act, 21  U.S.C. section 360bbb-3(b)(1), unless the authorization is  terminated or revoked. Performed at [facility], [place] [address], [place], [place] [address]   Culture, blood (x 2)     Status: None (Preliminary result)   Collection Time: 04/19/20  4:17 PM   Specimen: BLOOD  Result Value Ref Range Status   Specimen Description   Final    BLOOD LEFT ANTECUBITAL Performed at [facility] [address], [place], [place] [address]    Special Requests   Final    BOTTLES DRAWN AEROBIC ONLY Blood Culture adequate  volume Performed at [facility] [address], [place], [place] [address]    Culture   Final    NO GROWTH 4 DAYS Performed at [facility], [place] [address], [place], [place] [address]    Report Status PENDING  Incomplete  Culture, blood (x 2)     Status: None (Preliminary result)   Collection Time: 04/19/20  4:24 PM   Specimen: BLOOD LEFT FOREARM  Result Value Ref Range Status   Specimen Description   Final    BLOOD LEFT FOREARM Performed at [facility] [address], [place], [place] [address]    Special Requests   Final    BOTTLES DRAWN AEROBIC ONLY Blood Culture adequate volume Performed at [facility] [address], [place], [place] [address]    Culture   Final    NO GROWTH 4 DAYS Performed at [facility], [place] [address] [place], [place] [address]    Report Status PENDING  Incomplete     Labs: BNP (last 3 results) Recent Labs    11/10/19 1257 11/13/19 2144 04/19/20 1132  BNP 41.9 47.6 485.4*   Basic Metabolic Panel: Recent Labs  Lab 04/19/20 1132 04/19/20 1505 04/19/20 1616 04/20/20 0348 04/21/20 0503 04/22/20 0642 04/23/20 0544  NA   < >  --  146* 143 144 146* 141  K   < >  --  3.1* 3.4* 3.3* 3.5 3.5  CL   < >  --  102 101 104 106 105  CO2   < >  --  _0 GLUCOSE   < >  --  218* 211* 191* 178* 148*  BUN   < >  --  12 16 24* 27* 23  CREATININE   < >  --  1.02 1.04 1.07 1.04 0.99  CALCIUM   < >  --  12.4* 11.0* 9.9 9.3 8.5*  MG  --  2.2  --   --  2.1  --   --   PHOS  --   --   --   --  2.4*  --   --    < > = values in this interval not displayed.   Liver Function Tests: Recent Labs  Lab 04/19/20 1132 04/19/20 1616 04/21/20 0503 04/22/20 0642 04/23/20 0544  AST _1 ALT _2 ALKPHOS 82 72 69 70 64  BILITOT 0.9 1.0 0.6 0.6 0.7  PROT 7.2 6.5 6.0* 6.0* 5.6*  ALBUMIN 4.4 4.0 3.6 3.7 3.4*   No results for input(s): LIPASE, AMYLASE in the  last 168 hours. No results for input(s): AMMONIA in the last 168 hours. CBC: Recent Labs  Lab 04/19/20 0922 04/19/20 6270 04/19/20 1616 04/20/20 0348 04/21/20 0503 04/22/20 0642 04/23/20 0544  WBC 11.3*   < >  13.4* 14.1* 14.9* 14.0* 12.3*  NEUTROABS 6.4  --  11.1*  --  10.3* 10.0* 8.7*  HGB 12.2*   < > 11.9* 10.8* 10.1* 10.4* 10.6*  HCT 37.3*   < > 37.0* 33.3* 32.1* 32.9* 34.2*  MCV 85.7   < > 87.3 88.1 90.2 90.6 91.2  PLT 197   < > 203 171 182 185 161   < > = values in this interval not displayed.   Cardiac Enzymes: No results for input(s): CKTOTAL, CKMB, CKMBINDEX, TROPONINI in the last 168 hours. BNP: Invalid input(s): POCBNP CBG: Recent Labs  Lab 04/22/20 0806 04/22/20 1247 04/22/20 1657 04/22/20 2014 04/23/20 0713  GLUCAP 163* 169* 201* 161* 122*   D-Dimer No results for input(s): DDIMER in the last 72 hours. Hgb A1c No results for input(s): HGBA1C in the last 72 hours. Lipid Profile No results for input(s): CHOL, HDL, LDLCALC, TRIG, CHOLHDL, LDLDIRECT in the last 72 hours. Thyroid function studies No results for input(s): TSH, T4TOTAL, T3FREE, THYROIDAB in the last 72 hours.  Invalid input(s): FREET3 Anemia work up No results for input(s): VITAMINB12, FOLATE, FERRITIN, TIBC, IRON, RETICCTPCT in the last 72 hours. Urinalysis    Component Value Date/Time   COLORURINE YELLOW 10/23/2014 1754   APPEARANCEUR CLEAR 10/23/2014 1754   LABSPEC 1.021 10/23/2014 1754   PHURINE 5.5 10/23/2014 1754   GLUCOSEU NEGATIVE 10/23/2014 1754   HGBUR NEGATIVE 10/23/2014 1754   BILIRUBINUR NEGATIVE 10/23/2014 1754   KETONESUR 15 (A) 10/23/2014 1754   PROTEINUR NEGATIVE 10/23/2014 1754   UROBILINOGEN 0.2 10/23/2014 1754   NITRITE NEGATIVE 10/23/2014 1754   LEUKOCYTESUR NEGATIVE 10/23/2014 1754   Sepsis Labs Invalid input(s): PROCALCITONIN,  WBC,  LACTICIDVEN Microbiology Recent Results (from the past 240 hour(s))  Respiratory Panel by RT PCR (Flu A&B, Covid) -  Nasopharyngeal Swab     Status: None   Collection Time: 04/19/20 11:53 AM   Specimen: Nasopharyngeal Swab  Result Value Ref Range Status   SARS Coronavirus 2 by RT PCR NEGATIVE NEGATIVE Final    Comment: (NOTE) SARS-CoV-2 target nucleic acids are NOT DETECTED.  The SARS-CoV-2 RNA is generally detectable in upper respiratoy specimens during the acute phase of infection. The lowest concentration of SARS-CoV-2 viral copies this assay can detect is 131 copies/mL. A negative result does not preclude SARS-Cov-2 infection and should not be used as the sole basis for treatment or other patient management decisions. A negative result may occur with  improper specimen collection/handling, submission of specimen other than nasopharyngeal swab, presence of viral mutation(s) within the areas targeted by this assay, and inadequate number of viral copies (<131 copies/mL). A negative result must be combined with clinical observations, patient history, and epidemiological information. The expected result is Negative.  Fact Sheet for Patients:  https://www.fda.gov/media/142436/download  Fact Sheet for Healthcare Providers:  https://www.fda.gov/media/142435/download  This test is no t yet approved or cleared by the [place] FDA and  has been authorized for detection and/or diagnosis of SARS-CoV-2 by FDA under an Emergency Use Authorization (EUA). This EUA will remain  in effect (meaning this test can be used) for the duration of the COVID-19 declaration under Section 564(b)(1) of the Act, 21 U.S.C. section 360bbb-3(b)(1), unless the authorization is terminated or revoked sooner.     Influenza A by PCR NEGATIVE NEGATIVE Final   Influenza B by PCR NEGATIVE NEGATIVE Final    Comment: (NOTE) The Xpert Xpress SARS-CoV-2/FLU/RSV assay is intended as an aid in  the diagnosis of influenza from Nasopharyngeal swab   specimens and  should not be used as a sole basis for treatment. Nasal washings and   aspirates are unacceptable for Xpert Xpress SARS-CoV-2/FLU/RSV  testing.  Fact Sheet for Patients: https://www.fda.gov/media/142436/download  Fact Sheet for Healthcare Providers: https://www.fda.gov/media/142435/download  This test is not yet approved or cleared by the [place] FDA and  has been authorized for detection and/or diagnosis of SARS-CoV-2 by  FDA under an Emergency Use Authorization (EUA). This EUA will remain  in effect (meaning this test can be used) for the duration of the  Covid-19 declaration under Section 564(b)(1) of the Act, 21  U.S.C. section 360bbb-3(b)(1), unless the authorization is  terminated or revoked. Performed at [facility], [address] [place], [place] [address]   Culture, blood (x 2)     Status: None (Preliminary result)   Collection Time: 04/19/20  4:17 PM   Specimen: BLOOD  Result Value Ref Range Status   Specimen Description   Final    BLOOD LEFT ANTECUBITAL Performed at [facility], [address] [place], [place] [address]    Special Requests   Final    BOTTLES DRAWN AEROBIC ONLY Blood Culture adequate volume Performed at [facility], [address] [place], [place] [address]    Culture   Final    NO GROWTH 4 DAYS Performed at [facility], [address] [place], [place] [address]    Report Status PENDING  Incomplete  Culture, blood (x 2)     Status: None (Preliminary result)   Collection Time: 04/19/20  4:24 PM   Specimen: BLOOD LEFT FOREARM  Result Value Ref Range Status   Specimen Description   Final    BLOOD LEFT FOREARM Performed at [facility], [address] [place], [place] [address]    Special Requests   Final    BOTTLES DRAWN AEROBIC ONLY Blood Culture adequate volume Performed at [facility], [address] [place], [place] [address]    Culture   Final    NO GROWTH 4 DAYS Performed at [facility], [address] [place], [place] [address]    Report Status PENDING  Incomplete     Time coordinating discharge:  40 minutes  SIGNED:   Kuber Ghimire, MD  [facility] 04/23/2020, 12:09 PM 

## 2020-04-24 ENCOUNTER — Telehealth: Payer: Self-pay | Admitting: Hematology

## 2020-04-24 ENCOUNTER — Other Ambulatory Visit: Payer: Self-pay | Admitting: Hematology

## 2020-04-24 ENCOUNTER — Other Ambulatory Visit: Payer: Self-pay

## 2020-04-24 ENCOUNTER — Inpatient Hospital Stay: Payer: Medicare Other | Attending: Hematology

## 2020-04-24 ENCOUNTER — Telehealth: Payer: Self-pay | Admitting: *Deleted

## 2020-04-24 VITALS — BP 137/66 | HR 97 | Temp 97.7°F | Resp 18

## 2020-04-24 DIAGNOSIS — Z5189 Encounter for other specified aftercare: Secondary | ICD-10-CM | POA: Insufficient documentation

## 2020-04-24 DIAGNOSIS — Z7189 Other specified counseling: Secondary | ICD-10-CM

## 2020-04-24 DIAGNOSIS — C8310 Mantle cell lymphoma, unspecified site: Secondary | ICD-10-CM | POA: Diagnosis not present

## 2020-04-24 DIAGNOSIS — D702 Other drug-induced agranulocytosis: Secondary | ICD-10-CM

## 2020-04-24 DIAGNOSIS — C8318 Mantle cell lymphoma, lymph nodes of multiple sites: Secondary | ICD-10-CM

## 2020-04-24 LAB — CULTURE, BLOOD (ROUTINE X 2)
Culture: NO GROWTH
Culture: NO GROWTH
Special Requests: ADEQUATE
Special Requests: ADEQUATE

## 2020-04-24 MED ORDER — PEGFILGRASTIM-CBQV 6 MG/0.6ML ~~LOC~~ SOSY
PREFILLED_SYRINGE | SUBCUTANEOUS | Status: AC
  Filled 2020-04-24: qty 0.6

## 2020-04-24 MED ORDER — PEGFILGRASTIM-CBQV 6 MG/0.6ML ~~LOC~~ SOSY
6.0000 mg | PREFILLED_SYRINGE | Freq: Once | SUBCUTANEOUS | Status: AC
  Administered 2020-04-24: 6 mg via SUBCUTANEOUS

## 2020-04-24 NOTE — Progress Notes (Signed)
Referral to [facility]On[facility] Dr Kenn File placed

## 2020-04-24 NOTE — Telephone Encounter (Signed)
Released records to Dr.Matthew mckinney at [facility] for referral to [phone]   Release:  [phone]

## 2020-04-24 NOTE — Patient Instructions (Signed)

## 2020-04-24 NOTE — Telephone Encounter (Signed)
Dr. Irene Limbo referred patient to Dr. Festus Aloe at [facility]ematology[facility]Oncology for second opinion and management consultation. Their phone # [phone] and fax is [phone].   Spoke wtih [employer] regarding referral, she requested this office fax the following information:  Demographics  Insurance  Initial Consult note with Dr. Irene Limbo  Last 3 of Dr.Kale's OV notes w/labs  All Radiology reports   Staff Message sent to [facility] with request to fax documents

## 2020-04-29 ENCOUNTER — Other Ambulatory Visit: Payer: Self-pay

## 2020-04-29 ENCOUNTER — Inpatient Hospital Stay (HOSPITAL_COMMUNITY): Payer: Medicare Other

## 2020-04-29 ENCOUNTER — Inpatient Hospital Stay (HOSPITAL_COMMUNITY): Payer: Medicare Other | Admitting: Certified Registered Nurse Anesthetist

## 2020-04-29 ENCOUNTER — Emergency Department (HOSPITAL_COMMUNITY): Payer: Medicare Other

## 2020-04-29 ENCOUNTER — Inpatient Hospital Stay (HOSPITAL_COMMUNITY)
Admission: EM | Admit: 2020-04-29 | Discharge: 2020-05-23 | DRG: 840 | Disposition: E | Payer: Medicare Other | Attending: Internal Medicine | Admitting: Internal Medicine

## 2020-04-29 ENCOUNTER — Encounter (HOSPITAL_COMMUNITY): Payer: Self-pay | Admitting: Emergency Medicine

## 2020-04-29 DIAGNOSIS — E860 Dehydration: Secondary | ICD-10-CM | POA: Diagnosis present

## 2020-04-29 DIAGNOSIS — K921 Melena: Secondary | ICD-10-CM | POA: Diagnosis not present

## 2020-04-29 DIAGNOSIS — N170 Acute kidney failure with tubular necrosis: Secondary | ICD-10-CM | POA: Diagnosis present

## 2020-04-29 DIAGNOSIS — E875 Hyperkalemia: Secondary | ICD-10-CM | POA: Diagnosis present

## 2020-04-29 DIAGNOSIS — R1909 Other intra-abdominal and pelvic swelling, mass and lump: Secondary | ICD-10-CM | POA: Diagnosis present

## 2020-04-29 DIAGNOSIS — C9110 Chronic lymphocytic leukemia of B-cell type not having achieved remission: Secondary | ICD-10-CM | POA: Diagnosis present

## 2020-04-29 DIAGNOSIS — J9622 Acute and chronic respiratory failure with hypercapnia: Secondary | ICD-10-CM | POA: Diagnosis present

## 2020-04-29 DIAGNOSIS — D61810 Antineoplastic chemotherapy induced pancytopenia: Secondary | ICD-10-CM | POA: Diagnosis not present

## 2020-04-29 DIAGNOSIS — Z4682 Encounter for fitting and adjustment of non-vascular catheter: Secondary | ICD-10-CM | POA: Diagnosis not present

## 2020-04-29 DIAGNOSIS — L8990 Pressure ulcer of unspecified site, unspecified stage: Secondary | ICD-10-CM | POA: Insufficient documentation

## 2020-04-29 DIAGNOSIS — R195 Other fecal abnormalities: Secondary | ICD-10-CM | POA: Diagnosis not present

## 2020-04-29 DIAGNOSIS — E669 Obesity, unspecified: Secondary | ICD-10-CM | POA: Diagnosis present

## 2020-04-29 DIAGNOSIS — R188 Other ascites: Secondary | ICD-10-CM | POA: Diagnosis present

## 2020-04-29 DIAGNOSIS — R5081 Fever presenting with conditions classified elsewhere: Secondary | ICD-10-CM | POA: Diagnosis not present

## 2020-04-29 DIAGNOSIS — R590 Localized enlarged lymph nodes: Secondary | ICD-10-CM | POA: Diagnosis not present

## 2020-04-29 DIAGNOSIS — I10 Essential (primary) hypertension: Secondary | ICD-10-CM | POA: Diagnosis not present

## 2020-04-29 DIAGNOSIS — N179 Acute kidney failure, unspecified: Secondary | ICD-10-CM | POA: Diagnosis not present

## 2020-04-29 DIAGNOSIS — E883 Tumor lysis syndrome: Secondary | ICD-10-CM | POA: Diagnosis not present

## 2020-04-29 DIAGNOSIS — J398 Other specified diseases of upper respiratory tract: Secondary | ICD-10-CM | POA: Diagnosis present

## 2020-04-29 DIAGNOSIS — G9341 Metabolic encephalopathy: Secondary | ICD-10-CM | POA: Diagnosis not present

## 2020-04-29 DIAGNOSIS — K3189 Other diseases of stomach and duodenum: Secondary | ICD-10-CM | POA: Diagnosis not present

## 2020-04-29 DIAGNOSIS — Y95 Nosocomial condition: Secondary | ICD-10-CM | POA: Diagnosis not present

## 2020-04-29 DIAGNOSIS — D638 Anemia in other chronic diseases classified elsewhere: Secondary | ICD-10-CM | POA: Diagnosis present

## 2020-04-29 DIAGNOSIS — D61818 Other pancytopenia: Secondary | ICD-10-CM | POA: Diagnosis not present

## 2020-04-29 DIAGNOSIS — J9601 Acute respiratory failure with hypoxia: Secondary | ICD-10-CM | POA: Diagnosis not present

## 2020-04-29 DIAGNOSIS — Z20822 Contact with and (suspected) exposure to covid-19: Secondary | ICD-10-CM | POA: Diagnosis present

## 2020-04-29 DIAGNOSIS — R739 Hyperglycemia, unspecified: Secondary | ICD-10-CM | POA: Diagnosis not present

## 2020-04-29 DIAGNOSIS — R161 Splenomegaly, not elsewhere classified: Secondary | ICD-10-CM | POA: Diagnosis present

## 2020-04-29 DIAGNOSIS — Z91041 Radiographic dye allergy status: Secondary | ICD-10-CM

## 2020-04-29 DIAGNOSIS — E870 Hyperosmolality and hypernatremia: Secondary | ICD-10-CM

## 2020-04-29 DIAGNOSIS — J811 Chronic pulmonary edema: Secondary | ICD-10-CM | POA: Diagnosis present

## 2020-04-29 DIAGNOSIS — I5032 Chronic diastolic (congestive) heart failure: Secondary | ICD-10-CM | POA: Diagnosis present

## 2020-04-29 DIAGNOSIS — I700 Atherosclerosis of aorta: Secondary | ICD-10-CM | POA: Diagnosis present

## 2020-04-29 DIAGNOSIS — R140 Abdominal distension (gaseous): Secondary | ICD-10-CM | POA: Diagnosis not present

## 2020-04-29 DIAGNOSIS — J90 Pleural effusion, not elsewhere classified: Secondary | ICD-10-CM | POA: Diagnosis not present

## 2020-04-29 DIAGNOSIS — R21 Rash and other nonspecific skin eruption: Secondary | ICD-10-CM | POA: Diagnosis not present

## 2020-04-29 DIAGNOSIS — Z452 Encounter for adjustment and management of vascular access device: Secondary | ICD-10-CM | POA: Diagnosis not present

## 2020-04-29 DIAGNOSIS — R9389 Abnormal findings on diagnostic imaging of other specified body structures: Secondary | ICD-10-CM

## 2020-04-29 DIAGNOSIS — I161 Hypertensive emergency: Secondary | ICD-10-CM | POA: Diagnosis present

## 2020-04-29 DIAGNOSIS — D62 Acute posthemorrhagic anemia: Secondary | ICD-10-CM | POA: Diagnosis not present

## 2020-04-29 DIAGNOSIS — R0902 Hypoxemia: Secondary | ICD-10-CM

## 2020-04-29 DIAGNOSIS — I2693 Single subsegmental pulmonary embolism without acute cor pulmonale: Secondary | ICD-10-CM | POA: Diagnosis not present

## 2020-04-29 DIAGNOSIS — E876 Hypokalemia: Secondary | ICD-10-CM | POA: Diagnosis not present

## 2020-04-29 DIAGNOSIS — Z515 Encounter for palliative care: Secondary | ICD-10-CM | POA: Diagnosis not present

## 2020-04-29 DIAGNOSIS — R1902 Left upper quadrant abdominal swelling, mass and lump: Secondary | ICD-10-CM | POA: Diagnosis present

## 2020-04-29 DIAGNOSIS — L89156 Pressure-induced deep tissue damage of sacral region: Secondary | ICD-10-CM | POA: Diagnosis present

## 2020-04-29 DIAGNOSIS — J9621 Acute and chronic respiratory failure with hypoxia: Secondary | ICD-10-CM | POA: Diagnosis not present

## 2020-04-29 DIAGNOSIS — E872 Acidosis: Secondary | ICD-10-CM

## 2020-04-29 DIAGNOSIS — J810 Acute pulmonary edema: Secondary | ICD-10-CM | POA: Diagnosis present

## 2020-04-29 DIAGNOSIS — I110 Hypertensive heart disease with heart failure: Secondary | ICD-10-CM | POA: Diagnosis not present

## 2020-04-29 DIAGNOSIS — R1907 Generalized intra-abdominal and pelvic swelling, mass and lump: Secondary | ICD-10-CM | POA: Diagnosis not present

## 2020-04-29 DIAGNOSIS — R918 Other nonspecific abnormal finding of lung field: Secondary | ICD-10-CM | POA: Diagnosis not present

## 2020-04-29 DIAGNOSIS — R0600 Dyspnea, unspecified: Secondary | ICD-10-CM | POA: Diagnosis not present

## 2020-04-29 DIAGNOSIS — J9811 Atelectasis: Secondary | ICD-10-CM | POA: Diagnosis present

## 2020-04-29 DIAGNOSIS — I2699 Other pulmonary embolism without acute cor pulmonale: Secondary | ICD-10-CM | POA: Diagnosis present

## 2020-04-29 DIAGNOSIS — R042 Hemoptysis: Secondary | ICD-10-CM | POA: Diagnosis not present

## 2020-04-29 DIAGNOSIS — D709 Neutropenia, unspecified: Secondary | ICD-10-CM

## 2020-04-29 DIAGNOSIS — R1110 Vomiting, unspecified: Secondary | ICD-10-CM | POA: Diagnosis not present

## 2020-04-29 DIAGNOSIS — E1110 Type 2 diabetes mellitus with ketoacidosis without coma: Secondary | ICD-10-CM | POA: Diagnosis present

## 2020-04-29 DIAGNOSIS — R52 Pain, unspecified: Secondary | ICD-10-CM | POA: Diagnosis not present

## 2020-04-29 DIAGNOSIS — Z66 Do not resuscitate: Secondary | ICD-10-CM | POA: Diagnosis present

## 2020-04-29 DIAGNOSIS — T451X5A Adverse effect of antineoplastic and immunosuppressive drugs, initial encounter: Secondary | ICD-10-CM

## 2020-04-29 DIAGNOSIS — C8310 Mantle cell lymphoma, unspecified site: Principal | ICD-10-CM

## 2020-04-29 DIAGNOSIS — E8770 Fluid overload, unspecified: Secondary | ICD-10-CM | POA: Diagnosis present

## 2020-04-29 DIAGNOSIS — R1030 Lower abdominal pain, unspecified: Secondary | ICD-10-CM

## 2020-04-29 DIAGNOSIS — Z4659 Encounter for fitting and adjustment of other gastrointestinal appliance and device: Secondary | ICD-10-CM

## 2020-04-29 DIAGNOSIS — D6959 Other secondary thrombocytopenia: Secondary | ICD-10-CM | POA: Diagnosis present

## 2020-04-29 DIAGNOSIS — J181 Lobar pneumonia, unspecified organism: Secondary | ICD-10-CM | POA: Diagnosis not present

## 2020-04-29 DIAGNOSIS — Z95828 Presence of other vascular implants and grafts: Secondary | ICD-10-CM

## 2020-04-29 DIAGNOSIS — K8020 Calculus of gallbladder without cholecystitis without obstruction: Secondary | ICD-10-CM | POA: Diagnosis not present

## 2020-04-29 DIAGNOSIS — J9602 Acute respiratory failure with hypercapnia: Secondary | ICD-10-CM

## 2020-04-29 DIAGNOSIS — M7989 Other specified soft tissue disorders: Secondary | ICD-10-CM | POA: Diagnosis present

## 2020-04-29 DIAGNOSIS — K5730 Diverticulosis of large intestine without perforation or abscess without bleeding: Secondary | ICD-10-CM | POA: Diagnosis present

## 2020-04-29 DIAGNOSIS — I517 Cardiomegaly: Secondary | ICD-10-CM | POA: Diagnosis not present

## 2020-04-29 DIAGNOSIS — I2692 Saddle embolus of pulmonary artery without acute cor pulmonale: Secondary | ICD-10-CM | POA: Diagnosis not present

## 2020-04-29 DIAGNOSIS — R1032 Left lower quadrant pain: Secondary | ICD-10-CM | POA: Diagnosis not present

## 2020-04-29 DIAGNOSIS — Z9049 Acquired absence of other specified parts of digestive tract: Secondary | ICD-10-CM

## 2020-04-29 DIAGNOSIS — C786 Secondary malignant neoplasm of retroperitoneum and peritoneum: Secondary | ICD-10-CM | POA: Diagnosis present

## 2020-04-29 DIAGNOSIS — Z9911 Dependence on respirator [ventilator] status: Secondary | ICD-10-CM | POA: Diagnosis not present

## 2020-04-29 DIAGNOSIS — D649 Anemia, unspecified: Secondary | ICD-10-CM | POA: Diagnosis not present

## 2020-04-29 DIAGNOSIS — R0602 Shortness of breath: Secondary | ICD-10-CM | POA: Diagnosis not present

## 2020-04-29 DIAGNOSIS — L89150 Pressure ulcer of sacral region, unstageable: Secondary | ICD-10-CM | POA: Diagnosis present

## 2020-04-29 DIAGNOSIS — Z79899 Other long term (current) drug therapy: Secondary | ICD-10-CM

## 2020-04-29 DIAGNOSIS — Z789 Other specified health status: Secondary | ICD-10-CM

## 2020-04-29 DIAGNOSIS — R0689 Other abnormalities of breathing: Secondary | ICD-10-CM

## 2020-04-29 DIAGNOSIS — R609 Edema, unspecified: Secondary | ICD-10-CM | POA: Diagnosis not present

## 2020-04-29 DIAGNOSIS — Z6836 Body mass index (BMI) 36.0-36.9, adult: Secondary | ICD-10-CM

## 2020-04-29 DIAGNOSIS — I509 Heart failure, unspecified: Secondary | ICD-10-CM | POA: Diagnosis not present

## 2020-04-29 DIAGNOSIS — Z794 Long term (current) use of insulin: Secondary | ICD-10-CM

## 2020-04-29 DIAGNOSIS — Z978 Presence of other specified devices: Secondary | ICD-10-CM

## 2020-04-29 DIAGNOSIS — Z7189 Other specified counseling: Secondary | ICD-10-CM | POA: Diagnosis not present

## 2020-04-29 DIAGNOSIS — C8590 Non-Hodgkin lymphoma, unspecified, unspecified site: Secondary | ICD-10-CM | POA: Diagnosis not present

## 2020-04-29 DIAGNOSIS — R531 Weakness: Secondary | ICD-10-CM | POA: Diagnosis not present

## 2020-04-29 DIAGNOSIS — C8318 Mantle cell lymphoma, lymph nodes of multiple sites: Secondary | ICD-10-CM | POA: Diagnosis not present

## 2020-04-29 DIAGNOSIS — D701 Agranulocytosis secondary to cancer chemotherapy: Secondary | ICD-10-CM | POA: Diagnosis present

## 2020-04-29 LAB — CBC WITH DIFFERENTIAL/PLATELET
Abs Immature Granulocytes: 0.05 10*3/uL (ref 0.00–0.07)
Basophils Absolute: 0 10*3/uL (ref 0.0–0.1)
Basophils Relative: 0 %
Eosinophils Absolute: 0.1 10*3/uL (ref 0.0–0.5)
Eosinophils Relative: 2 %
HCT: 35.5 % — ABNORMAL LOW (ref 39.0–52.0)
Hemoglobin: 11.5 g/dL — ABNORMAL LOW (ref 13.0–17.0)
Immature Granulocytes: 1 %
Lymphocytes Relative: 59 %
Lymphs Abs: 2.5 10*3/uL (ref 0.7–4.0)
MCH: 28.1 pg (ref 26.0–34.0)
MCHC: 32.4 g/dL (ref 30.0–36.0)
MCV: 86.8 fL (ref 80.0–100.0)
Monocytes Absolute: 0.9 10*3/uL (ref 0.1–1.0)
Monocytes Relative: 21 %
Neutro Abs: 0.7 10*3/uL — ABNORMAL LOW (ref 1.7–7.7)
Neutrophils Relative %: 17 %
Platelets: 100 10*3/uL — ABNORMAL LOW (ref 150–400)
RBC: 4.09 MIL/uL — ABNORMAL LOW (ref 4.22–5.81)
RDW: 16.7 % — ABNORMAL HIGH (ref 11.5–15.5)
WBC: 4.3 10*3/uL (ref 4.0–10.5)
nRBC: 0 % (ref 0.0–0.2)

## 2020-04-29 LAB — BLOOD GAS, ARTERIAL
Bicarbonate: 20.6 mmol/L (ref 20.0–28.0)
Drawn by: 23281
FIO2: 80
MECHVT: 470 mL
O2 Saturation: 99.2 %
PEEP: 5 cmH2O
Patient temperature: 99.1
RATE: 16 resp/min
pCO2 arterial: 35.2 mmHg (ref 32.0–48.0)
pH, Arterial: 7.385 (ref 7.350–7.450)
pO2, Arterial: 179 mmHg — ABNORMAL HIGH (ref 83.0–108.0)

## 2020-04-29 LAB — POCT I-STAT 7, (LYTES, BLD GAS, ICA,H+H)
Acid-Base Excess: 0 mmol/L (ref 0.0–2.0)
Bicarbonate: 27.3 mmol/L (ref 20.0–28.0)
Calcium, Ion: 1.3 mmol/L (ref 1.15–1.40)
HCT: 30 % — ABNORMAL LOW (ref 39.0–52.0)
Hemoglobin: 10.2 g/dL — ABNORMAL LOW (ref 13.0–17.0)
O2 Saturation: 100 %
Patient temperature: 99.7
Potassium: 5 mmol/L (ref 3.5–5.1)
Sodium: 140 mmol/L (ref 135–145)
TCO2: 29 mmol/L (ref 22–32)
pCO2 arterial: 58.2 mmHg — ABNORMAL HIGH (ref 32.0–48.0)
pH, Arterial: 7.282 — ABNORMAL LOW (ref 7.350–7.450)
pO2, Arterial: 278 mmHg — ABNORMAL HIGH (ref 83.0–108.0)

## 2020-04-29 LAB — URINALYSIS, ROUTINE W REFLEX MICROSCOPIC
Bacteria, UA: NONE SEEN
Bilirubin Urine: NEGATIVE
Glucose, UA: NEGATIVE mg/dL
Hgb urine dipstick: NEGATIVE
Ketones, ur: 5 mg/dL — AB
Leukocytes,Ua: NEGATIVE
Nitrite: NEGATIVE
Protein, ur: NEGATIVE mg/dL
Specific Gravity, Urine: 1.018 (ref 1.005–1.030)
pH: 5 (ref 5.0–8.0)

## 2020-04-29 LAB — RESPIRATORY PANEL BY RT PCR (FLU A&B, COVID)
Influenza A by PCR: NEGATIVE
Influenza B by PCR: NEGATIVE
SARS Coronavirus 2 by RT PCR: NEGATIVE

## 2020-04-29 LAB — D-DIMER, QUANTITATIVE: D-Dimer, Quant: 2.47 ug/mL-FEU — ABNORMAL HIGH (ref 0.00–0.50)

## 2020-04-29 LAB — TRIGLYCERIDES: Triglycerides: 243 mg/dL — ABNORMAL HIGH (ref <150)

## 2020-04-29 LAB — BETA-HYDROXYBUTYRIC ACID: Beta-Hydroxybutyric Acid: 1.05 mmol/L — ABNORMAL HIGH (ref 0.05–0.27)

## 2020-04-29 LAB — COMPREHENSIVE METABOLIC PANEL
ALT: 18 U/L (ref 0–44)
AST: 19 U/L (ref 15–41)
Albumin: 3.8 g/dL (ref 3.5–5.0)
Alkaline Phosphatase: 108 U/L (ref 38–126)
Anion gap: 17 — ABNORMAL HIGH (ref 5–15)
BUN: 40 mg/dL — ABNORMAL HIGH (ref 8–23)
CO2: 23 mmol/L (ref 22–32)
Calcium: 10.6 mg/dL — ABNORMAL HIGH (ref 8.9–10.3)
Chloride: 99 mmol/L (ref 98–111)
Creatinine, Ser: 1.11 mg/dL (ref 0.61–1.24)
GFR, Estimated: >60 mL/min (ref >60)
Glucose, Bld: 290 mg/dL — ABNORMAL HIGH (ref 70–99)
Potassium: 4.8 mmol/L (ref 3.5–5.1)
Sodium: 139 mmol/L (ref 135–145)
Total Bilirubin: 0.9 mg/dL (ref 0.3–1.2)
Total Protein: 5.9 g/dL — ABNORMAL LOW (ref 6.5–8.1)

## 2020-04-29 LAB — HEPARIN LEVEL (UNFRACTIONATED): Heparin Unfractionated: 0.27 IU/mL — ABNORMAL LOW (ref 0.30–0.70)

## 2020-04-29 LAB — LACTIC ACID, PLASMA
Lactic Acid, Venous: 3.2 mmol/L (ref 0.5–1.9)
Lactic Acid, Venous: 2.3 mmol/L (ref 0.5–1.9)

## 2020-04-29 LAB — BRAIN NATRIURETIC PEPTIDE: B Natriuretic Peptide: 133.3 pg/mL — ABNORMAL HIGH (ref 0.0–100.0)

## 2020-04-29 LAB — HEMOGLOBIN A1C
Hgb A1c MFr Bld: 6.3 % — ABNORMAL HIGH (ref 4.8–5.6)
Mean Plasma Glucose: 134.11 mg/dL

## 2020-04-29 LAB — TROPONIN I (HIGH SENSITIVITY): Troponin I (High Sensitivity): 52 ng/L — ABNORMAL HIGH (ref <18)

## 2020-04-29 LAB — MRSA PCR SCREENING: MRSA by PCR: NEGATIVE

## 2020-04-29 LAB — GLUCOSE, CAPILLARY
Glucose-Capillary: 255 mg/dL — ABNORMAL HIGH (ref 70–99)
Glucose-Capillary: 272 mg/dL — ABNORMAL HIGH (ref 70–99)

## 2020-04-29 MED ORDER — DEXMEDETOMIDINE HCL IN NACL 200 MCG/50ML IV SOLN
0.2000 ug/kg/h | INTRAVENOUS | Status: DC
  Administered 2020-04-29: 0.4 ug/kg/h via INTRAVENOUS
  Filled 2020-04-29 (×2): qty 50

## 2020-04-29 MED ORDER — ALLOPURINOL 300 MG PO TABS
300.0000 mg | ORAL_TABLET | Freq: Every day | ORAL | Status: DC
  Filled 2020-04-29: qty 3
  Filled 2020-04-29: qty 1

## 2020-04-29 MED ORDER — HEPARIN BOLUS VIA INFUSION
2500.0000 [IU] | Freq: Once | INTRAVENOUS | Status: AC
  Administered 2020-04-29: 2500 [IU] via INTRAVENOUS
  Filled 2020-04-29: qty 2500

## 2020-04-29 MED ORDER — OXYCODONE-ACETAMINOPHEN 7.5-325 MG PO TABS: 1.0000 | ORAL_TABLET | Freq: Four times a day (QID) | ORAL | Status: DC | PRN

## 2020-04-29 MED ORDER — SUCCINYLCHOLINE CHLORIDE 20 MG/ML IJ SOLN
INTRAMUSCULAR | Status: DC | PRN
  Administered 2020-04-29: 140 mg via INTRAVENOUS

## 2020-04-29 MED ORDER — IOHEXOL 350 MG/ML SOLN
100.0000 mL | Freq: Once | INTRAVENOUS | Status: AC | PRN
  Administered 2020-04-29: 100 mL via INTRAVENOUS

## 2020-04-29 MED ORDER — PHENYLEPHRINE 40 MCG/ML (10ML) SYRINGE FOR IV PUSH (FOR BLOOD PRESSURE SUPPORT)
PREFILLED_SYRINGE | INTRAVENOUS | Status: DC | PRN
  Administered 2020-04-29: 120 ug via INTRAVENOUS

## 2020-04-29 MED ORDER — AMLODIPINE BESYLATE 5 MG PO TABS: 10.0000 mg | ORAL_TABLET | Freq: Every day | ORAL | Status: DC

## 2020-04-29 MED ORDER — ORAL CARE MOUTH RINSE
15.0000 mL | OROMUCOSAL | Status: DC
  Administered 2020-04-30 – 2020-05-05 (×49): 15 mL via OROMUCOSAL

## 2020-04-29 MED ORDER — NITROGLYCERIN IN D5W 200-5 MCG/ML-% IV SOLN
INTRAVENOUS | Status: AC
  Filled 2020-04-29: qty 250

## 2020-04-29 MED ORDER — CHLORHEXIDINE GLUCONATE CLOTH 2 % EX PADS
6.0000 | MEDICATED_PAD | Freq: Every day | CUTANEOUS | Status: DC
  Administered 2020-04-29 – 2020-05-21 (×21): 6 via TOPICAL

## 2020-04-29 MED ORDER — SODIUM CHLORIDE 0.9 % IV SOLN: INTRAVENOUS | Status: DC

## 2020-04-29 MED ORDER — NICARDIPINE HCL IN NACL 20-0.86 MG/200ML-% IV SOLN
0.0000 mg/h | INTRAVENOUS | Status: DC
  Filled 2020-04-29: qty 200

## 2020-04-29 MED ORDER — ONDANSETRON HCL 4 MG/2ML IJ SOLN
4.0000 mg | Freq: Once | INTRAMUSCULAR | Status: AC
  Administered 2020-04-29: 4 mg via INTRAVENOUS

## 2020-04-29 MED ORDER — DIPHENHYDRAMINE HCL 50 MG/ML IJ SOLN
50.0000 mg | Freq: Once | INTRAMUSCULAR | Status: AC
  Administered 2020-04-29: 50 mg via INTRAVENOUS
  Filled 2020-04-29: qty 1

## 2020-04-29 MED ORDER — DIPHENHYDRAMINE HCL 25 MG PO CAPS: 50.0000 mg | ORAL_CAPSULE | Freq: Once | ORAL | Status: AC

## 2020-04-29 MED ORDER — PROPOFOL 1000 MG/100ML IV EMUL
5.0000 ug/kg/min | INTRAVENOUS | Status: DC
  Administered 2020-04-29: 5 ug/kg/min via INTRAVENOUS
  Administered 2020-04-30: 40 ug/kg/min via INTRAVENOUS
  Filled 2020-04-29 (×2): qty 200

## 2020-04-29 MED ORDER — HYDRALAZINE HCL 20 MG/ML IJ SOLN
40.0000 mg | Freq: Once | INTRAMUSCULAR | Status: AC
  Administered 2020-04-29: 40 mg via INTRAVENOUS
  Filled 2020-04-29: qty 2

## 2020-04-29 MED ORDER — FENTANYL CITRATE (PF) 100 MCG/2ML IJ SOLN
25.0000 ug | INTRAMUSCULAR | Status: DC | PRN
  Administered 2020-04-29 – 2020-04-30 (×5): 100 ug via INTRAVENOUS
  Administered 2020-05-12: 50 ug via INTRAVENOUS
  Filled 2020-04-29 (×6): qty 2

## 2020-04-29 MED ORDER — ACETAMINOPHEN 650 MG RE SUPP
650.0000 mg | Freq: Four times a day (QID) | RECTAL | Status: DC | PRN
  Administered 2020-04-30: 650 mg via RECTAL
  Filled 2020-04-29: qty 1

## 2020-04-29 MED ORDER — FENTANYL CITRATE (PF) 100 MCG/2ML IJ SOLN
50.0000 ug | Freq: Once | INTRAMUSCULAR | Status: AC
  Administered 2020-04-29: 50 ug via INTRAVENOUS
  Filled 2020-04-29: qty 2

## 2020-04-29 MED ORDER — SODIUM CHLORIDE (PF) 0.9 % IJ SOLN
INTRAMUSCULAR | Status: AC
  Filled 2020-04-29: qty 50

## 2020-04-29 MED ORDER — NITROGLYCERIN IN D5W 200-5 MCG/ML-% IV SOLN
0.0000 ug/min | INTRAVENOUS | Status: DC
  Administered 2020-04-29: 5 ug/min via INTRAVENOUS

## 2020-04-29 MED ORDER — RAMIPRIL 10 MG PO CAPS: 10.0000 mg | ORAL_CAPSULE | Freq: Every day | ORAL | Status: DC

## 2020-04-29 MED ORDER — HYDROCORTISONE NA SUCCINATE PF 250 MG IJ SOLR
200.0000 mg | Freq: Once | INTRAMUSCULAR | Status: AC
  Administered 2020-04-29: 200 mg via INTRAVENOUS
  Filled 2020-04-29: qty 200

## 2020-04-29 MED ORDER — AMLODIPINE BESYLATE 5 MG PO TABS
10.0000 mg | ORAL_TABLET | Freq: Every day | ORAL | Status: DC
  Administered 2020-04-29: 10 mg via ORAL
  Filled 2020-04-29 (×2): qty 2

## 2020-04-29 MED ORDER — HEPARIN (PORCINE) 25000 UT/250ML-% IV SOLN
1350.0000 [IU]/h | INTRAVENOUS | Status: DC
  Administered 2020-04-29: 1350 [IU]/h via INTRAVENOUS
  Filled 2020-04-29 (×2): qty 250

## 2020-04-29 MED ORDER — ONDANSETRON HCL 4 MG/2ML IJ SOLN
4.0000 mg | Freq: Once | INTRAMUSCULAR | Status: DC
  Filled 2020-04-29: qty 2

## 2020-04-29 MED ORDER — NICARDIPINE HCL IN NACL 20-0.86 MG/200ML-% IV SOLN
0.0000 mg/h | INTRAVENOUS | Status: DC
Start: 2020-04-29 — End: 2020-04-29

## 2020-04-29 MED ORDER — ACETAMINOPHEN 325 MG PO TABS: 650.0000 mg | ORAL_TABLET | Freq: Four times a day (QID) | ORAL | Status: DC | PRN

## 2020-04-29 MED ORDER — GUAIFENESIN 100 MG/5ML PO SOLN: 10.0000 mL | ORAL | Status: DC | PRN

## 2020-04-29 MED ORDER — METOCLOPRAMIDE HCL 5 MG/ML IJ SOLN
10.0000 mg | Freq: Once | INTRAMUSCULAR | Status: AC
  Administered 2020-04-29: 10 mg via INTRAVENOUS
  Filled 2020-04-29: qty 2

## 2020-04-29 MED ORDER — PROPOFOL 10 MG/ML IV BOLUS
INTRAVENOUS | Status: DC | PRN
  Administered 2020-04-29: 130 mg via INTRAVENOUS

## 2020-04-29 MED ORDER — CHLORHEXIDINE GLUCONATE 0.12% ORAL RINSE (MEDLINE KIT)
15.0000 mL | Freq: Two times a day (BID) | OROMUCOSAL | Status: DC
  Administered 2020-04-29 – 2020-05-04 (×8): 15 mL via OROMUCOSAL

## 2020-04-29 NOTE — H&P (Addendum)
History and Physical    Dean Dixon QHU:[id number] DOB: 1950-08-16 DOA: 05/12/2020  PCP: Leonides Sake, MD  Patient coming from: Home  Chief Complaint: dyspnea, ab distention  HPI: Dean Dixon is a 69 y.o. male with medical history significant of mantle cell lymphoma on CHOP. Presenting with dyspnea and ab distention. Reports 2 weeks of shortness of breath that has worsened in the last 2 days. He notes that he has started a new chemo regimen around that time. He also describes mid-sternal tightness in his chest that does not radiate. It is intermittent. He describes some lightly productive cough (no blood noted in mucous). He denies any fevers. He reports that he did not try any medications or other therapies to help. It finally progress far enough that today he decided to come to the ED. With respect to his ab distention. He reports that this is a chronic issue related to his lymphoma and the reason he started chemo.    ED Course: CT of chest and abdomin obtained. It was positive for pulmonary embolism and widespread abdominal masses. He was started on heparin gtt and [facility] was called for admission.   Review of Systems:  Denies F/N/V/D. Denies sick contacts. Review of systems is otherwise negative for all not mentioned in HPI.   PMHx Past Medical History:  Diagnosis Date  . Cancer ([place])    Mantle Cell Lymphoma  . Diabetes mellitus without complication ([place])   . Hypertension     PSHx Past Surgical History:  Procedure Laterality Date  . APPENDECTOMY    . TONSILLECTOMY      SocHx  reports that he has never smoked. He has never used smokeless tobacco. He reports current alcohol use of about 2.0 standard drinks of alcohol per week. He reports that he does not use drugs.  Allergies  Allergen Reactions  . Ivp Dye [Iodinated Diagnostic Agents] Shortness Of Breath    "allergic to CT scan dye", took benadryl with last scan and tolerated well    FamHx No family history on  file.  Prior to Admission medications   Medication Sig Start Date End Date Taking? Authorizing Provider  allopurinol (ZYLOPRIM) 300 MG tablet Take 1 tablet (300 mg total) by mouth daily. 04/24/20 05/24/20 Yes Ghimire, Dante Gang, MD  amLODipine (NORVASC) 10 MG tablet Take 10 mg by mouth daily.   Yes [provider]  insulin detemir (LEVEMIR) 100 UNIT/ML FlexPen Inject 10 Units into the skin at bedtime for 5 days. Patient taking differently: Inject 11 Units into the skin at bedtime.  11/17/19 04/25/2020 Yes Mariel Aloe, MD  ramipril (ALTACE) 10 MG capsule Take 10 mg by mouth daily.   Yes [provider]  blood glucose meter kit and supplies Check blood sugar 4 times daily (Before breakfast, lunch, dinner, bedtime). (FOR ICD-10 E10.9, E11.9). 11/17/19   Mariel Aloe, MD  Insulin Pen Needle 31G X 5 MM MISC BD Pen Needles- brand specific Inject insulin via insulin pen 6 x daily 11/17/19   Mariel Aloe, MD  prochlorperazine (COMPAZINE) 10 MG tablet Take 1 tablet (10 mg total) by mouth every 6 (six) hours as needed (Nausea or vomiting). Patient not taking: Reported on 04/19/2020 06/20/19 04/19/20  Brunetta Genera, MD    Physical Exam: Vitals:   05/05/2020 0604 05/10/2020 1109 05/04/2020 1214 05/09/2020 1256  BP: (!) 167/90 (!) 158/84 (!) 167/94 (!) 159/98  Pulse: (!) 117 (!) 122 (!) 122 (!) 127  Resp: 18 (!) 28 (!) 30 (!)  26  Temp:      TempSrc:      SpO2: 97% 95% 95% 93%  Weight:      Height:        General: 69 y.o. male resting in bed in NAD Eyes: PERRL, normal sclera ENMT: Nares patent w/o discharge, orophaynx clear, dentition normal, ears w/o discharge/lesions/ulcers Neck: Supple, trachea midline Cardiovascular: RRR, +S1, S2, no m/g/r, equal pulses throughout Respiratory: CTABL, no w/r/r, normal WOB GI: BS+, NDNT, no masses noted, no organomegaly noted MSK: No e/c/c Skin: No rashes, bruises, ulcerations noted Neuro: A&O x 3, no focal deficits Psyc: Appropriate  interaction and affect, calm/cooperative  Labs on Admission: I have personally reviewed following labs and imaging studies  CBC: Recent Labs  Lab 04/23/20 0544 05/12/2020 0503  WBC 12.3* 4.3  NEUTROABS 8.7* 0.7*  HGB 10.6* 11.5*  HCT 34.2* 35.5*  MCV 91.2 86.8  PLT 161 062*   Basic Metabolic Panel: Recent Labs  Lab 04/23/20 0544 05/18/2020 0503  NA 141 139  K 3.5 4.8  CL 105 99  CO2 24 23  GLUCOSE 148* 290*  BUN 23 40*  CREATININE 0.99 1.11  CALCIUM 8.5* 10.6*   GFR: Estimated Creatinine Clearance: 65.3 mL/min (by C-G formula based on SCr of 1.11 mg/dL). Liver Function Tests: Recent Labs  Lab 04/23/20 0544 05/12/2020 0503  AST 19 19  ALT 15 18  ALKPHOS 64 108  BILITOT 0.7 0.9  PROT 5.6* 5.9*  ALBUMIN 3.4* 3.8   No results for input(s): LIPASE, AMYLASE in the last 168 hours. No results for input(s): AMMONIA in the last 168 hours. Coagulation Profile: No results for input(s): INR, PROTIME in the last 168 hours. Cardiac Enzymes: No results for input(s): CKTOTAL, CKMB, CKMBINDEX, TROPONINI in the last 168 hours. BNP (last 3 results) No results for input(s): PROBNP in the last 8760 hours. HbA1C: No results for input(s): HGBA1C in the last 72 hours. CBG: Recent Labs  Lab 04/22/20 1657 04/22/20 2014 04/23/20 0713 04/23/20 1213  GLUCAP 201* 161* 122* 145*   Lipid Profile: No results for input(s): CHOL, HDL, LDLCALC, TRIG, CHOLHDL, LDLDIRECT in the last 72 hours. Thyroid Function Tests: No results for input(s): TSH, T4TOTAL, FREET4, T3FREE, THYROIDAB in the last 72 hours. Anemia Panel: No results for input(s): VITAMINB12, FOLATE, FERRITIN, TIBC, IRON, RETICCTPCT in the last 72 hours. Urine analysis:    Component Value Date/Time   COLORURINE YELLOW 04/24/2020 0406   APPEARANCEUR TURBID (A) 05/20/2020 0406   LABSPEC 1.018 05/03/2020 0406   PHURINE 5.0 05/08/2020 0406   GLUCOSEU NEGATIVE 05/10/2020 0406   HGBUR NEGATIVE 04/28/2020 0406   BILIRUBINUR  NEGATIVE 04/30/2020 0406   KETONESUR 5 (A) 05/15/2020 0406   PROTEINUR NEGATIVE 04/26/2020 0406   UROBILINOGEN 0.2 10/23/2014 1754   NITRITE NEGATIVE 05/03/2020 0406   LEUKOCYTESUR NEGATIVE 04/27/2020 0406    Radiological Exams on Admission: DG Chest 2 View  Result Date: 04/26/2020 CLINICAL DATA:  Shortness of breath lasting several days. Groin swelling and redness. EXAM: CHEST - 2 VIEW COMPARISON:  Chest CT 04/19/20 FINDINGS: Stable cardiomediastinal contours. Low lung volumes. Unchanged small bilateral pleural effusions. No airspace densities. IMPRESSION: Low lung volumes with persistent small bilateral pleural effusions. Electronically Signed   By: Kerby Moors M.D.   On: 05/05/2020 05:09   CT Angio Chest PE W and/or Wo Contrast  Result Date: 05/05/2020 CLINICAL DATA:  69 year old male with history of shortness of breath for the past few days. Abdominal distension. History of mantle cell lymphoma. EXAM:  CT ANGIOGRAPHY CHEST CT ABDOMEN AND PELVIS WITH CONTRAST TECHNIQUE: Multidetector CT imaging of the chest was performed using the standard protocol during bolus administration of intravenous contrast. Multiplanar CT image reconstructions and MIPs were obtained to evaluate the vascular anatomy. Multidetector CT imaging of the abdomen and pelvis was performed using the standard protocol during bolus administration of intravenous contrast. CONTRAST:  177m OMNIPAQUE IOHEXOL 350 MG/ML SOLN COMPARISON:  CT the abdomen and pelvis 04/19/2020. CT the chest 04/19/2020. FINDINGS: CTA CHEST FINDINGS Cardiovascular: Study is limited by patient respiratory motion. However, despite this limitation there are segmental sized filling defects within pulmonary arteries to the left upper and lower lobes best appreciated on axial images 73 and 106 of series 5, and axial image 148 of series 5 respectively, compatible with pulmonary embolism. Heart size is normal. There is no significant pericardial fluid, thickening or  pericardial calcification. Aortic atherosclerosis. No definite coronary artery calcifications. Mediastinum/Nodes: Multiple enlarged anterior mediastinal lymph nodes noted inferiorly, immediately above the right hemidiaphragm, measuring up to 2.3 cm in short axis (axial image 13 of series 11), increased compared to the prior study. Prominent but nonenlarged distal paraesophageal lymph node measuring 8 mm in short axis, nonspecific. Circumferential thickening of the distal third of the esophagus. Mildly enlarged left axillary lymph nodes measuring up to 1.3 cm in short axis. Lungs/Pleura: Collapse of the trachea and mainstem bronchi during expiration, indicative of tracheobronchomalacia. No acute consolidative airspace disease. Small bilateral pleural effusions lying dependently. No definite suspicious appearing pulmonary nodules or masses are noted. Musculoskeletal: There are no aggressive appearing lytic or blastic lesions noted in the visualized portions of the skeleton. Review of the MIP images confirms the above findings. CT ABDOMEN and PELVIS FINDINGS Hepatobiliary: Multiple well-defined low-attenuation lesions are again noted throughout the liver, similar in size, number and distribution to the prior study, largest of which are compatible with simple cysts measuring up to 5.3 x 3.7 cm in segment 2 and for a (axial image 18 of series 11). Smaller lesions are too small to definitively characterize, but also favored to represent small cysts or biliary hamartomas. No new suspicious hepatic lesions. No intra or extrahepatic biliary ductal dilatation. 6 mm calcified gallstone lying dependently in the gallbladder. Gallbladder is not distended. Pancreas: No pancreatic mass. No pancreatic ductal dilatation. No pancreatic or peripancreatic fluid collections or inflammatory changes. Spleen: Unremarkable. Adrenals/Urinary Tract: 1.2 cm low-attenuation lesion in the upper pole of the right kidney and exophytic 2.4 cm  low-attenuation lesion in the upper pole of the left kidney, compatible with simple cysts. Other subcentimeter low-attenuation lesions in both kidneys, too small to definitively characterize, but favored to represent tiny cysts. No hydroureteronephrosis. Urinary bladder is nearly decompressed, but otherwise unremarkable in appearance. Stomach/Bowel: Stomach is unremarkable in appearance. No pathologic dilatation of small bowel or colon. Multiple bowel loops in the left upper quadrant are intimately associated with the large soft tissue mass (discussed below). Colonic diverticulosis, particularly in the sigmoid colon, without definitive evidence to suggest an acute diverticulitis at this time. Vascular/Lymphatic: Aortic atherosclerosis, without evidence of aneurysm or dissection in the abdominal or pelvic vasculature. Extensive lymphadenopathy noted in the abdomen, most evident in the retroperitoneum where there are bulky para-aortic nodal masses measuring up to 2.3 x 4.1 cm near the left renal artery (axial image 41 of series 11), 3.2 x 4.6 cm adjacent to the infrarenal abdominal aorta (axial image 48 of series 11), and 3.4 x 3.2 cm adjacent to the aortic bifurcation (axial image 54 of  series 11), all of which appear similar to the recent prior study. Bulky celiac axis and gastrohepatic ligament lymph nodes are noted, measuring up to 1.9 cm in short axis (axial image 24 of series 11). Several prominent but nonenlarged pelvic lymph nodes are noted (nonspecific). Reproductive: Prostate gland and seminal vesicles are unremarkable in appearance. Other: Bulky poorly defined soft tissue mass in the left upper quadrant of the abdomen which is intimately associated with multiple adjacent bowel loops, and difficult to distinctly defined, but estimated to measure approximately 15.5 x 13.5 x 13.4 cm (axial image 38 of series 11 and coronal image 53 of series 14). Moderate volume of ascites. Diffuse enhancement of the  peritoneal membranes, indicative of widespread peritoneal metastatic disease. Extensive omental caking, best appreciated in the left side of the abdomen where the bulkiest portion of this measures approximately 14.9 x 4.0 cm (axial image 47 of series 11). No pneumoperitoneum. Musculoskeletal: There are no aggressive appearing lytic or blastic lesions noted in the visualized portions of the skeleton. Review of the MIP images confirms the above findings. IMPRESSION: 1. Study is positive for segmental sized pulmonary emboli in the left lung. These appear nonocclusive at this time, and there is no associated pulmonary hemorrhage to suggest frank pulmonary infarct. 2. Bulky soft tissue mass in the left upper quadrant with extensive lymphadenopathy in the chest and abdomen, and evidence of widespread intraperitoneal metastatic disease, as detailed above. 3. Small bilateral pleural effusions lying dependently. 4. Aortic atherosclerosis. 5. Tracheobronchomalacia. 6. Cholelithiasis. 7. Colonic diverticulosis. Electronically Signed   By: Vinnie Langton M.D.   On: 05/06/2020 12:50   CT Abdomen Pelvis W Contrast  Result Date: 04/23/2020 CLINICAL DATA:  69 year old male with history of shortness of breath for the past few days. Abdominal distension. History of mantle cell lymphoma. EXAM: CT ANGIOGRAPHY CHEST CT ABDOMEN AND PELVIS WITH CONTRAST TECHNIQUE: Multidetector CT imaging of the chest was performed using the standard protocol during bolus administration of intravenous contrast. Multiplanar CT image reconstructions and MIPs were obtained to evaluate the vascular anatomy. Multidetector CT imaging of the abdomen and pelvis was performed using the standard protocol during bolus administration of intravenous contrast. CONTRAST:  136m OMNIPAQUE IOHEXOL 350 MG/ML SOLN COMPARISON:  CT the abdomen and pelvis 04/19/2020. CT the chest 04/19/2020. FINDINGS: CTA CHEST FINDINGS Cardiovascular: Study is limited by patient  respiratory motion. However, despite this limitation there are segmental sized filling defects within pulmonary arteries to the left upper and lower lobes best appreciated on axial images 73 and 106 of series 5, and axial image 148 of series 5 respectively, compatible with pulmonary embolism. Heart size is normal. There is no significant pericardial fluid, thickening or pericardial calcification. Aortic atherosclerosis. No definite coronary artery calcifications. Mediastinum/Nodes: Multiple enlarged anterior mediastinal lymph nodes noted inferiorly, immediately above the right hemidiaphragm, measuring up to 2.3 cm in short axis (axial image 13 of series 11), increased compared to the prior study. Prominent but nonenlarged distal paraesophageal lymph node measuring 8 mm in short axis, nonspecific. Circumferential thickening of the distal third of the esophagus. Mildly enlarged left axillary lymph nodes measuring up to 1.3 cm in short axis. Lungs/Pleura: Collapse of the trachea and mainstem bronchi during expiration, indicative of tracheobronchomalacia. No acute consolidative airspace disease. Small bilateral pleural effusions lying dependently. No definite suspicious appearing pulmonary nodules or masses are noted. Musculoskeletal: There are no aggressive appearing lytic or blastic lesions noted in the visualized portions of the skeleton. Review of the MIP images confirms the above  findings. CT ABDOMEN and PELVIS FINDINGS Hepatobiliary: Multiple well-defined low-attenuation lesions are again noted throughout the liver, similar in size, number and distribution to the prior study, largest of which are compatible with simple cysts measuring up to 5.3 x 3.7 cm in segment 2 and for a (axial image 18 of series 11). Smaller lesions are too small to definitively characterize, but also favored to represent small cysts or biliary hamartomas. No new suspicious hepatic lesions. No intra or extrahepatic biliary ductal dilatation.  6 mm calcified gallstone lying dependently in the gallbladder. Gallbladder is not distended. Pancreas: No pancreatic mass. No pancreatic ductal dilatation. No pancreatic or peripancreatic fluid collections or inflammatory changes. Spleen: Unremarkable. Adrenals/Urinary Tract: 1.2 cm low-attenuation lesion in the upper pole of the right kidney and exophytic 2.4 cm low-attenuation lesion in the upper pole of the left kidney, compatible with simple cysts. Other subcentimeter low-attenuation lesions in both kidneys, too small to definitively characterize, but favored to represent tiny cysts. No hydroureteronephrosis. Urinary bladder is nearly decompressed, but otherwise unremarkable in appearance. Stomach/Bowel: Stomach is unremarkable in appearance. No pathologic dilatation of small bowel or colon. Multiple bowel loops in the left upper quadrant are intimately associated with the large soft tissue mass (discussed below). Colonic diverticulosis, particularly in the sigmoid colon, without definitive evidence to suggest an acute diverticulitis at this time. Vascular/Lymphatic: Aortic atherosclerosis, without evidence of aneurysm or dissection in the abdominal or pelvic vasculature. Extensive lymphadenopathy noted in the abdomen, most evident in the retroperitoneum where there are bulky para-aortic nodal masses measuring up to 2.3 x 4.1 cm near the left renal artery (axial image 41 of series 11), 3.2 x 4.6 cm adjacent to the infrarenal abdominal aorta (axial image 48 of series 11), and 3.4 x 3.2 cm adjacent to the aortic bifurcation (axial image 54 of series 11), all of which appear similar to the recent prior study. Bulky celiac axis and gastrohepatic ligament lymph nodes are noted, measuring up to 1.9 cm in short axis (axial image 24 of series 11). Several prominent but nonenlarged pelvic lymph nodes are noted (nonspecific). Reproductive: Prostate gland and seminal vesicles are unremarkable in appearance. Other: Bulky  poorly defined soft tissue mass in the left upper quadrant of the abdomen which is intimately associated with multiple adjacent bowel loops, and difficult to distinctly defined, but estimated to measure approximately 15.5 x 13.5 x 13.4 cm (axial image 38 of series 11 and coronal image 53 of series 14). Moderate volume of ascites. Diffuse enhancement of the peritoneal membranes, indicative of widespread peritoneal metastatic disease. Extensive omental caking, best appreciated in the left side of the abdomen where the bulkiest portion of this measures approximately 14.9 x 4.0 cm (axial image 47 of series 11). No pneumoperitoneum. Musculoskeletal: There are no aggressive appearing lytic or blastic lesions noted in the visualized portions of the skeleton. Review of the MIP images confirms the above findings. IMPRESSION: 1. Study is positive for segmental sized pulmonary emboli in the left lung. These appear nonocclusive at this time, and there is no associated pulmonary hemorrhage to suggest frank pulmonary infarct. 2. Bulky soft tissue mass in the left upper quadrant with extensive lymphadenopathy in the chest and abdomen, and evidence of widespread intraperitoneal metastatic disease, as detailed above. 3. Small bilateral pleural effusions lying dependently. 4. Aortic atherosclerosis. 5. Tracheobronchomalacia. 6. Cholelithiasis. 7. Colonic diverticulosis. Electronically Signed   By: Vinnie Langton M.D.   On: 05/20/2020 12:50   DG Abd Portable 2 Views  Result Date: 05/11/2020 CLINICAL DATA:  Abdominal distension and pain. EXAM: PORTABLE ABDOMEN - 2 VIEW COMPARISON:  04/19/2020 FINDINGS: There is mild gaseous distension of the stomach. No dilated loops of large or small bowel. There is no evidence of free air. No radio-opaque calculi or other significant radiographic abnormality is seen. IMPRESSION: Nonobstructive bowel gas pattern. Electronically Signed   By: Kerby Moors M.D.   On: 05/10/2020 06:12    Assessment/Plan Pulmonary Embolism     - admit to inpt, stepdown     - heparin gtt; pharm consult     - check echo     - O2 support as necessary  Mantle Cell Lymphoma     - follows with Dr. Irene Limbo; onco notified of his admission (Dr. Lindi Adie); team will review him while he is here     - currently on CHOP     - CT results noted  Hyperglycemia     - no dx of DM     - last A1c on 11/14/19 is 5.4     - check A1c; start sensitive SSI for now  HAGMA Lactic acidosis     - fluids     - UA ok, no obvious infection on CTA      - recently admitted and started on CAP coverage     - no ascites noted in CT     - not sure of cause of his lactic acidosis; infection thus far is r/o'd; can't check procal cause he's a CA patient on chemo; trend lactic acid for now, low threshhold to start abx, but not sure what we would be treating     - he does have a gap... check beta hydroxybutyric acid  DVT prophylaxis: Heparin gtt  Code Status: FULL Code  Family Communication: None at bedside  Consults called: Oncology (Dr. Lindi Adie)  Status is: Inpatient  Remains inpatient appropriate because:Inpatient level of care appropriate due to severity of illness   Dispo: The patient is from: Home              Anticipated d/c is to: Home              Anticipated d/c date is: 3 days              Patient currently is not medically stable to d/c.  Jonnie Finner DO [facility]  If 7PM-7AM, please contact night-coverage [email or web address]  05/20/2020, 1:29 PM

## 2020-04-29 NOTE — Anesthesia Procedure Notes (Signed)
Procedure Name: Intubation Date/Time: 05/09/2020 9:03 PM Performed by: Raenette Rover, CRNA Pre-anesthesia Checklist: Patient identified, Emergency Drugs available, Suction available and Patient being monitored Patient Re-evaluated:Patient Re-evaluated prior to induction Oxygen Delivery Method: Circle system utilized Preoxygenation: Pre-oxygenation with 100% oxygen Induction Type: IV induction Ventilation: Mask ventilation without difficulty Laryngoscope Size: Glidescope and 4 Grade View: Grade I Tube type: Oral Tube size: 8.0 mm Number of attempts: 1 Airway Equipment and Method: Stylet and Video-laryngoscopy Placement Confirmation: ETT inserted through vocal cords under direct vision,  positive ETCO2 and breath sounds checked- equal and bilateral Secured at: 24 cm Tube secured with: Tape Dental Injury: Teeth and Oropharynx as per pre-operative assessment

## 2020-04-29 NOTE — Progress Notes (Signed)
PCCM Note  Attempted to call family listed in EMR to update family on vented status and need for central access to administer medications including sedation while on mechanical ventilation. No answer and unable to leave voicemail.  Will place line under emergent consent.  Rodman Pickle, M.D. [facility] Pulmonary/Critical Care Medicine 05/12/2020 11:37 PM

## 2020-04-29 NOTE — Progress Notes (Signed)
Arterial ABG results @ 2046 PH 7.291 PCO2@ 56.7 PO2 275 CO3 27.3 sO2 100  Pt Temp 99.7  Pt on NRB at time or arterial draw

## 2020-04-29 NOTE — ED Notes (Signed)
ED TO INPATIENT HANDOFF REPORT  ED Nurse Name and Phone #: [phone]  S Name/Age/Gender Dean Dixon 69 y.o. male Room/Bed: WA13/WA13  Code Status   Code Status: Prior  Home/SNF/Other Home Patient oriented to: self, place, time and situation Is this baseline? Yes   Triage Complete: Triage complete  Chief Complaint Pulmonary embolism [facility]) [I26.99]  Triage Note Patient states shortness of breath the last few days with increase in groin swelling and redness since his last admission. Patient endorses vomiting. Patient labored with difficulty speaking in full sentences. Patient also endorses nausea and vomiting.     Allergies Allergies  Allergen Reactions  . Ivp Dye [Iodinated Diagnostic Agents] Shortness Of Breath    "allergic to CT scan dye", took benadryl with last scan and tolerated well    Level of Care/Admitting Diagnosis ED Disposition    ED Disposition Condition Comment   Admit  Hospital Area: [facility] [[id number]]  Level of Care: Stepdown [14]  Admit to SDU based on following criteria: Other see comments  Comments: Pulm embolism requiring heparin gtt  May admit patient to [facility] or [facility] if equivalent level of care is available:: No  Covid Evaluation: Asymptomatic Screening Protocol (No Symptoms)  Diagnosis: Pulmonary embolism [facility]) [[id number]]  Admitting Physician: Jonnie Finner [[id number]]  Attending Physician: Jonnie Finner [[id number]]  Estimated length of stay: past midnight tomorrow  Certification:: I certify this patient will need inpatient services for at least 2 midnights       B Medical/Surgery History Past Medical History:  Diagnosis Date  . Cancer ([place])    Mantle Cell Lymphoma  . Diabetes mellitus without complication ([place])   . Hypertension    Past Surgical History:  Procedure Laterality Date  . APPENDECTOMY    . TONSILLECTOMY       A IV Location/Drains/Wounds Patient Lines/Drains/Airways Status    Active  Line/Drains/Airways    Name Placement date Placement time Site Days   Peripheral IV 05/10/2020 Left Antecubital 05/11/2020  0524  Antecubital  less than 1          Intake/Output Last 24 hours No intake or output data in the 24 hours ending 04/28/2020 1547  Labs/Imaging Results for orders placed or performed during the hospital encounter of 05/15/2020 (from the past 48 hour(s))  Urinalysis, Routine w reflex microscopic Urine, Clean Catch     Status: Abnormal   Collection Time: 04/28/2020  4:06 AM  Result Value Ref Range   Color, Urine YELLOW YELLOW   APPearance TURBID (A) CLEAR   Specific Gravity, Urine 1.018 1.005 - 1.030   pH 5.0 5.0 - 8.0   Glucose, UA NEGATIVE NEGATIVE mg/dL   Hgb urine dipstick NEGATIVE NEGATIVE   Bilirubin Urine NEGATIVE NEGATIVE   Ketones, ur 5 (A) NEGATIVE mg/dL   Protein, ur NEGATIVE NEGATIVE mg/dL   Nitrite NEGATIVE NEGATIVE   Leukocytes,Ua NEGATIVE NEGATIVE   RBC / HPF 0-5 0 - 5 RBC/hpf   Bacteria, UA NONE SEEN NONE SEEN    Comment: Performed at [facility] [address] [place], [place] [address]  Brain natriuretic peptide     Status: Abnormal   Collection Time: 05/05/2020  4:53 AM  Result Value Ref Range   B Natriuretic Peptide 133.3 (H) 0.0 - 100.0 pg/mL    Comment: Performed at [facility], [place] [address] [place], [place] [address]  D-dimer, quantitative (not at [facility])     Status: Abnormal   Collection Time: 05/10/2020  4:54 AM  Result Value Ref Range   D-Dimer, Quant 2.47 (H) 0.00 - 0.50 ug/mL-FEU    Comment: (NOTE) At the manufacturer cut-off value of 0.5 g/mL FEU, this assay has a negative predictive value of 95-100%.This assay is intended for use in conjunction with a clinical pretest probability (PTP) assessment model to exclude pulmonary embolism (PE) and deep venous thrombosis (DVT) in outpatients suspected of PE or DVT. Results should be correlated with clinical presentation. Performed at [facility], [place] [address] [place], [place] [address]   Lactic acid, plasma     Status: Abnormal   Collection Time: 04/28/2020  5:03 AM  Result Value Ref Range   Lactic Acid, Venous 3.2 (HH) 0.5 - 1.9 mmol/L    Comment: CRITICAL RESULT CALLED TO, READ BACK BY AND VERIFIED WITH: T,BYNOM AT 4270 ON 05/08/2020 BY A,MOHAMED Performed at [facility], [place] [address] [place], [place] [address]   Comprehensive metabolic panel     Status: Abnormal   Collection Time: 04/27/2020  5:03 AM  Result Value Ref Range   Sodium 139 135 - 145 mmol/L   Potassium 4.8 3.5 - 5.1 mmol/L   Chloride 99 98 - 111 mmol/L   CO2 23 22 - 32 mmol/L   Glucose, Bld 290 (H) 70 - 99 mg/dL    Comment: Glucose reference range applies only to samples taken after fasting for at least 8 hours.   BUN 40 (H) 8 - 23 mg/dL   Creatinine, Ser 1.11 0.61 - 1.24 mg/dL   Calcium 10.6 (H) 8.9 - 10.3 mg/dL   Total Protein 5.9 (L) 6.5 - 8.1 g/dL   Albumin 3.8 3.5 - 5.0 g/dL   AST 19 15 - 41 U/L   ALT 18 0 - 44 U/L   Alkaline Phosphatase 108 38 - 126 U/L   Total Bilirubin 0.9 0.3 - 1.2 mg/dL   GFR, Estimated >60 >60 mL/min    Comment: (NOTE) Calculated using the CKD-EPI Creatinine Equation (2021)    Anion gap 17 (H) 5 - 15    Comment: Performed at [facility], [place] [address], [place], [place] [address]  CBC with Differential     Status: Abnormal   Collection Time: 04/24/2020  5:03 AM  Result Value Ref Range   WBC 4.3 4.0 - 10.5 K/uL   RBC 4.09 (L) 4.22 - 5.81 MIL/uL   Hemoglobin 11.5 (L) 13.0 - 17.0 g/dL   HCT 35.5 (L) 39 - 52 %   MCV 86.8 80.0 - 100.0 fL   MCH 28.1 26.0 - 34.0 pg   MCHC 32.4 30.0 - 36.0 g/dL   RDW 16.7 (H) 11.5 - 15.5 %   Platelets 100 (L) 150 - 400 K/uL    Comment: REPEATED TO VERIFY PLATELET COUNT CONFIRMED BY SMEAR Immature Platelet Fraction may be clinically indicated, consider ordering this additional test [id number]    nRBC 0.0 0.0 - 0.2 %   Neutrophils  Relative % 17 %   Neutro Abs 0.7 (L) 1.7 - 7.7 K/uL   Lymphocytes Relative 59 %   Lymphs Abs 2.5 0.7 - 4.0 K/uL   Monocytes Relative 21 %   Monocytes Absolute 0.9 0.1 - 1.0 K/uL   Eosinophils Relative 2 %   Eosinophils Absolute 0.1 0.0 - 0.5 K/uL   Basophils Relative 0 %   Basophils Absolute 0.0 0.0 - 0.1 K/uL   Immature Granulocytes 1 %   Abs Immature Granulocytes 0.05 0.00 - 0.07 K/uL  Smudge Cells PRESENT    Ovalocytes PRESENT     Comment: Performed at [facility], [place] [address] [place], [place] [address]  Lactic acid, plasma     Status: Abnormal   Collection Time: 05/03/2020  6:06 AM  Result Value Ref Range   Lactic Acid, Venous 2.3 (HH) 0.5 - 1.9 mmol/L    Comment: CRITICAL VALUE NOTED.  VALUE IS CONSISTENT WITH PREVIOUSLY REPORTED AND CALLED VALUE. Performed at [facility], [place] [address], [place], [place] [address]   Respiratory Panel by RT PCR (Flu A&B, Covid) - Nasopharyngeal Swab     Status: None   Collection Time: 05/14/2020  1:14 PM   Specimen: Nasopharyngeal Swab  Result Value Ref Range   SARS Coronavirus 2 by RT PCR NEGATIVE NEGATIVE    Comment: (NOTE) SARS-CoV-2 target nucleic acids are NOT DETECTED.  The SARS-CoV-2 RNA is generally detectable in upper respiratoy specimens during the acute phase of infection. The lowest concentration of SARS-CoV-2 viral copies this assay can detect is 131 copies/mL. A negative result does not preclude SARS-Cov-2 infection and should not be used as the sole basis for treatment or other patient management decisions. A negative result may occur with  improper specimen collection/handling, submission of specimen other than nasopharyngeal swab, presence of viral mutation(s) within the areas targeted by this assay, and inadequate number of viral copies (<131 copies/mL). A negative result must be combined with clinical observations, patient history, and epidemiological information. The expected  result is Negative.  Fact Sheet for Patients:  PinkCheek.be  Fact Sheet for Healthcare Providers:  GravelBags.it  This test is no t yet approved or cleared by the [place] FDA and  has been authorized for detection and/or diagnosis of SARS-CoV-2 by FDA under an Emergency Use Authorization (EUA). This EUA will remain  in effect (meaning this test can be used) for the duration of the COVID-19 declaration under Section 564(b)(1) of the Act, 21 U.S.C. section 360bbb-3(b)(1), unless the authorization is terminated or revoked sooner.     Influenza A by PCR NEGATIVE NEGATIVE   Influenza B by PCR NEGATIVE NEGATIVE    Comment: (NOTE) The Xpert Xpress SARS-CoV-2/FLU/RSV assay is intended as an aid in  the diagnosis of influenza from Nasopharyngeal swab specimens and  should not be used as a sole basis for treatment. Nasal washings and  aspirates are unacceptable for Xpert Xpress SARS-CoV-2/FLU/RSV  testing.  Fact Sheet for Patients: PinkCheek.be  Fact Sheet for Healthcare Providers: GravelBags.it  This test is not yet approved or cleared by the [place] FDA and  has been authorized for detection and/or diagnosis of SARS-CoV-2 by  FDA under an Emergency Use Authorization (EUA). This EUA will remain  in effect (meaning this test can be used) for the duration of the  Covid-19 declaration under Section 564(b)(1) of the Act, 21  U.S.C. section 360bbb-3(b)(1), unless the authorization is  terminated or revoked. Performed at [facility], [place] [address] [place], [place] [address]    DG Chest 2 View  Result Date: 05/04/2020 CLINICAL DATA:  Shortness of breath lasting several days. Groin swelling and redness. EXAM: CHEST - 2 VIEW COMPARISON:  Chest CT 04/19/20 FINDINGS: Stable cardiomediastinal contours. Low lung volumes. Unchanged small bilateral  pleural effusions. No airspace densities. IMPRESSION: Low lung volumes with persistent small bilateral pleural effusions. Electronically Signed   By: Kerby Moors M.D.   On: 04/30/2020 05:09   CT Angio Chest PE W and/or Wo Contrast  Result Date: 05/17/2020 CLINICAL DATA:  69 year old male with history of shortness of breath for the past few days. Abdominal distension. History of mantle cell lymphoma. EXAM: CT ANGIOGRAPHY CHEST CT ABDOMEN AND PELVIS WITH CONTRAST TECHNIQUE: Multidetector CT imaging of the chest was performed using the standard protocol during bolus administration of intravenous contrast. Multiplanar CT image reconstructions and MIPs were obtained to evaluate the vascular anatomy. Multidetector CT imaging of the abdomen and pelvis was performed using the standard protocol during bolus administration of intravenous contrast. CONTRAST:  173mL OMNIPAQUE IOHEXOL 350 MG/ML SOLN COMPARISON:  CT the abdomen and pelvis 04/19/2020. CT the chest 04/19/2020. FINDINGS: CTA CHEST FINDINGS Cardiovascular: Study is limited by patient respiratory motion. However, despite this limitation there are segmental sized filling defects within pulmonary arteries to the left upper and lower lobes best appreciated on axial images 73 and 106 of series 5, and axial image 148 of series 5 respectively, compatible with pulmonary embolism. Heart size is normal. There is no significant pericardial fluid, thickening or pericardial calcification. Aortic atherosclerosis. No definite coronary artery calcifications. Mediastinum/Nodes: Multiple enlarged anterior mediastinal lymph nodes noted inferiorly, immediately above the right hemidiaphragm, measuring up to 2.3 cm in short axis (axial image 13 of series 11), increased compared to the prior study. Prominent but nonenlarged distal paraesophageal lymph node measuring 8 mm in short axis, nonspecific. Circumferential thickening of the distal third of the esophagus. Mildly enlarged left  axillary lymph nodes measuring up to 1.3 cm in short axis. Lungs/Pleura: Collapse of the trachea and mainstem bronchi during expiration, indicative of tracheobronchomalacia. No acute consolidative airspace disease. Small bilateral pleural effusions lying dependently. No definite suspicious appearing pulmonary nodules or masses are noted. Musculoskeletal: There are no aggressive appearing lytic or blastic lesions noted in the visualized portions of the skeleton. Review of the MIP images confirms the above findings. CT ABDOMEN and PELVIS FINDINGS Hepatobiliary: Multiple well-defined low-attenuation lesions are again noted throughout the liver, similar in size, number and distribution to the prior study, largest of which are compatible with simple cysts measuring up to 5.3 x 3.7 cm in segment 2 and for a (axial image 18 of series 11). Smaller lesions are too small to definitively characterize, but also favored to represent small cysts or biliary hamartomas. No new suspicious hepatic lesions. No intra or extrahepatic biliary ductal dilatation. 6 mm calcified gallstone lying dependently in the gallbladder. Gallbladder is not distended. Pancreas: No pancreatic mass. No pancreatic ductal dilatation. No pancreatic or peripancreatic fluid collections or inflammatory changes. Spleen: Unremarkable. Adrenals/Urinary Tract: 1.2 cm low-attenuation lesion in the upper pole of the right kidney and exophytic 2.4 cm low-attenuation lesion in the upper pole of the left kidney, compatible with simple cysts. Other subcentimeter low-attenuation lesions in both kidneys, too small to definitively characterize, but favored to represent tiny cysts. No hydroureteronephrosis. Urinary bladder is nearly decompressed, but otherwise unremarkable in appearance. Stomach/Bowel: Stomach is unremarkable in appearance. No pathologic dilatation of small bowel or colon. Multiple bowel loops in the left upper quadrant are intimately associated with the  large soft tissue mass (discussed below). Colonic diverticulosis, particularly in the sigmoid colon, without definitive evidence to suggest an acute diverticulitis at this time. Vascular/Lymphatic: Aortic atherosclerosis, without evidence of aneurysm or dissection in the abdominal or pelvic vasculature. Extensive lymphadenopathy noted in the abdomen, most evident in the retroperitoneum where there are bulky para-aortic nodal masses measuring up to 2.3 x 4.1 cm near the left renal artery (axial image 41 of series 11), 3.2 x 4.6  cm adjacent to the infrarenal abdominal aorta (axial image 48 of series 11), and 3.4 x 3.2 cm adjacent to the aortic bifurcation (axial image 54 of series 11), all of which appear similar to the recent prior study. Bulky celiac axis and gastrohepatic ligament lymph nodes are noted, measuring up to 1.9 cm in short axis (axial image 24 of series 11). Several prominent but nonenlarged pelvic lymph nodes are noted (nonspecific). Reproductive: Prostate gland and seminal vesicles are unremarkable in appearance. Other: Bulky poorly defined soft tissue mass in the left upper quadrant of the abdomen which is intimately associated with multiple adjacent bowel loops, and difficult to distinctly defined, but estimated to measure approximately 15.5 x 13.5 x 13.4 cm (axial image 38 of series 11 and coronal image 53 of series 14). Moderate volume of ascites. Diffuse enhancement of the peritoneal membranes, indicative of widespread peritoneal metastatic disease. Extensive omental caking, best appreciated in the left side of the abdomen where the bulkiest portion of this measures approximately 14.9 x 4.0 cm (axial image 47 of series 11). No pneumoperitoneum. Musculoskeletal: There are no aggressive appearing lytic or blastic lesions noted in the visualized portions of the skeleton. Review of the MIP images confirms the above findings. IMPRESSION: 1. Study is positive for segmental sized pulmonary emboli in the  left lung. These appear nonocclusive at this time, and there is no associated pulmonary hemorrhage to suggest frank pulmonary infarct. 2. Bulky soft tissue mass in the left upper quadrant with extensive lymphadenopathy in the chest and abdomen, and evidence of widespread intraperitoneal metastatic disease, as detailed above. 3. Small bilateral pleural effusions lying dependently. 4. Aortic atherosclerosis. 5. Tracheobronchomalacia. 6. Cholelithiasis. 7. Colonic diverticulosis. Electronically Signed   By: Vinnie Langton M.D.   On: 05/06/2020 12:50   CT Abdomen Pelvis W Contrast  Result Date: 05/07/2020 CLINICAL DATA:  69 year old male with history of shortness of breath for the past few days. Abdominal distension. History of mantle cell lymphoma. EXAM: CT ANGIOGRAPHY CHEST CT ABDOMEN AND PELVIS WITH CONTRAST TECHNIQUE: Multidetector CT imaging of the chest was performed using the standard protocol during bolus administration of intravenous contrast. Multiplanar CT image reconstructions and MIPs were obtained to evaluate the vascular anatomy. Multidetector CT imaging of the abdomen and pelvis was performed using the standard protocol during bolus administration of intravenous contrast. CONTRAST:  153mL OMNIPAQUE IOHEXOL 350 MG/ML SOLN COMPARISON:  CT the abdomen and pelvis 04/19/2020. CT the chest 04/19/2020. FINDINGS: CTA CHEST FINDINGS Cardiovascular: Study is limited by patient respiratory motion. However, despite this limitation there are segmental sized filling defects within pulmonary arteries to the left upper and lower lobes best appreciated on axial images 73 and 106 of series 5, and axial image 148 of series 5 respectively, compatible with pulmonary embolism. Heart size is normal. There is no significant pericardial fluid, thickening or pericardial calcification. Aortic atherosclerosis. No definite coronary artery calcifications. Mediastinum/Nodes: Multiple enlarged anterior mediastinal lymph nodes  noted inferiorly, immediately above the right hemidiaphragm, measuring up to 2.3 cm in short axis (axial image 13 of series 11), increased compared to the prior study. Prominent but nonenlarged distal paraesophageal lymph node measuring 8 mm in short axis, nonspecific. Circumferential thickening of the distal third of the esophagus. Mildly enlarged left axillary lymph nodes measuring up to 1.3 cm in short axis. Lungs/Pleura: Collapse of the trachea and mainstem bronchi during expiration, indicative of tracheobronchomalacia. No acute consolidative airspace disease. Small bilateral pleural effusions lying dependently. No definite suspicious appearing pulmonary nodules or masses are  noted. Musculoskeletal: There are no aggressive appearing lytic or blastic lesions noted in the visualized portions of the skeleton. Review of the MIP images confirms the above findings. CT ABDOMEN and PELVIS FINDINGS Hepatobiliary: Multiple well-defined low-attenuation lesions are again noted throughout the liver, similar in size, number and distribution to the prior study, largest of which are compatible with simple cysts measuring up to 5.3 x 3.7 cm in segment 2 and for a (axial image 18 of series 11). Smaller lesions are too small to definitively characterize, but also favored to represent small cysts or biliary hamartomas. No new suspicious hepatic lesions. No intra or extrahepatic biliary ductal dilatation. 6 mm calcified gallstone lying dependently in the gallbladder. Gallbladder is not distended. Pancreas: No pancreatic mass. No pancreatic ductal dilatation. No pancreatic or peripancreatic fluid collections or inflammatory changes. Spleen: Unremarkable. Adrenals/Urinary Tract: 1.2 cm low-attenuation lesion in the upper pole of the right kidney and exophytic 2.4 cm low-attenuation lesion in the upper pole of the left kidney, compatible with simple cysts. Other subcentimeter low-attenuation lesions in both kidneys, too small to  definitively characterize, but favored to represent tiny cysts. No hydroureteronephrosis. Urinary bladder is nearly decompressed, but otherwise unremarkable in appearance. Stomach/Bowel: Stomach is unremarkable in appearance. No pathologic dilatation of small bowel or colon. Multiple bowel loops in the left upper quadrant are intimately associated with the large soft tissue mass (discussed below). Colonic diverticulosis, particularly in the sigmoid colon, without definitive evidence to suggest an acute diverticulitis at this time. Vascular/Lymphatic: Aortic atherosclerosis, without evidence of aneurysm or dissection in the abdominal or pelvic vasculature. Extensive lymphadenopathy noted in the abdomen, most evident in the retroperitoneum where there are bulky para-aortic nodal masses measuring up to 2.3 x 4.1 cm near the left renal artery (axial image 41 of series 11), 3.2 x 4.6 cm adjacent to the infrarenal abdominal aorta (axial image 48 of series 11), and 3.4 x 3.2 cm adjacent to the aortic bifurcation (axial image 54 of series 11), all of which appear similar to the recent prior study. Bulky celiac axis and gastrohepatic ligament lymph nodes are noted, measuring up to 1.9 cm in short axis (axial image 24 of series 11). Several prominent but nonenlarged pelvic lymph nodes are noted (nonspecific). Reproductive: Prostate gland and seminal vesicles are unremarkable in appearance. Other: Bulky poorly defined soft tissue mass in the left upper quadrant of the abdomen which is intimately associated with multiple adjacent bowel loops, and difficult to distinctly defined, but estimated to measure approximately 15.5 x 13.5 x 13.4 cm (axial image 38 of series 11 and coronal image 53 of series 14). Moderate volume of ascites. Diffuse enhancement of the peritoneal membranes, indicative of widespread peritoneal metastatic disease. Extensive omental caking, best appreciated in the left side of the abdomen where the bulkiest  portion of this measures approximately 14.9 x 4.0 cm (axial image 47 of series 11). No pneumoperitoneum. Musculoskeletal: There are no aggressive appearing lytic or blastic lesions noted in the visualized portions of the skeleton. Review of the MIP images confirms the above findings. IMPRESSION: 1. Study is positive for segmental sized pulmonary emboli in the left lung. These appear nonocclusive at this time, and there is no associated pulmonary hemorrhage to suggest frank pulmonary infarct. 2. Bulky soft tissue mass in the left upper quadrant with extensive lymphadenopathy in the chest and abdomen, and evidence of widespread intraperitoneal metastatic disease, as detailed above. 3. Small bilateral pleural effusions lying dependently. 4. Aortic atherosclerosis. 5. Tracheobronchomalacia. 6. Cholelithiasis. 7. Colonic diverticulosis.  Electronically Signed   By: Vinnie Langton M.D.   On: 05/17/2020 12:50   DG Abd Portable 2 Views  Result Date: 05/19/2020 CLINICAL DATA:  Abdominal distension and pain. EXAM: PORTABLE ABDOMEN - 2 VIEW COMPARISON:  04/19/2020 FINDINGS: There is mild gaseous distension of the stomach. No dilated loops of large or small bowel. There is no evidence of free air. No radio-opaque calculi or other significant radiographic abnormality is seen. IMPRESSION: Nonobstructive bowel gas pattern. Electronically Signed   By: Kerby Moors M.D.   On: 05/16/2020 06:12    Pending Labs Unresulted Labs (From admission, onward)          Start     Ordered   04/30/20 0500  CBC  Daily,   R      05/18/2020 1325   05/15/2020 2200  Heparin level (unfractionated)  Once-Timed,   STAT        05/19/2020 1325   05/18/2020 1457  Beta-hydroxybutyric acid  Once,   STAT        05/19/2020 1458   05/10/2020 1449  Hemoglobin A1c  Once,   STAT        05/04/2020 1449   Signed and Held  Comprehensive metabolic panel  Tomorrow morning,   R        Signed and Held   Signed and Held  CBC  Tomorrow morning,   R        Signed  and Held          Vitals/Pain Today's Vitals   04/26/2020 1344 05/16/2020 1421 05/17/2020 1507 05/04/2020 1542  BP: (!) 123/108 (!) 143/75 (!) 152/93 (!) 149/72  Pulse: (!) 136 (!) 126 (!) 125 (!) 130  Resp: (!) 28 (!) 29 (!) 24   Temp:      TempSrc:      SpO2: 96% 97% 90% 96%  Weight:      Height:        Isolation Precautions No active isolations  Medications Medications  ondansetron (ZOFRAN) injection 4 mg (has no administration in time range)  sodium chloride (PF) 0.9 % injection (has no administration in time range)  heparin ADULT infusion 100 units/mL (25000 units/244mL sodium chloride 0.45%) (1,350 Units/hr Intravenous New Bag/Given 05/09/2020 1410)  fentaNYL (SUBLIMAZE) injection 50 mcg (50 mcg Intravenous Given 04/27/2020 0527)  ondansetron (ZOFRAN) injection 4 mg (4 mg Intravenous Given 05/03/2020 0525)  fentaNYL (SUBLIMAZE) injection 50 mcg (50 mcg Intravenous Given 05/11/2020 0701)  metoCLOPramide (REGLAN) injection 10 mg (10 mg Intravenous Given 05/03/2020 0701)  hydrocortisone sodium succinate (SOLU-CORTEF) injection 200 mg (200 mg Intravenous Given 04/26/2020 0746)  diphenhydrAMINE (BENADRYL) capsule 50 mg ( Oral See Alternative 04/23/2020 1031)    Or  diphenhydrAMINE (BENADRYL) injection 50 mg (50 mg Intravenous Given 05/13/2020 1031)  iohexol (OMNIPAQUE) 350 MG/ML injection 100 mL (100 mLs Intravenous Contrast Given 05/19/2020 1131)  heparin bolus via infusion 2,500 Units (2,500 Units Intravenous Bolus from Bag 05/02/2020 1407)    Mobility walks with device Low fall risk   Focused Assessments .   R Recommendations: See Admitting Provider Note  Report given to:   Additional Notes: n/a

## 2020-04-29 NOTE — ED Notes (Signed)
Patient transported to X-ray 

## 2020-04-29 NOTE — Transfer of Care (Signed)
Immediate Anesthesia Transfer of Care Note  Patient: Dean Dixon  Procedure(s) Performed: AN AD [place]  Patient Location: ICU  Anesthesia Type:General  Level of Consciousness: sedated  Airway & Oxygen Therapy: Patient remains intubated per anesthesia plan  Post-op Assessment: Report given to RN and Post -op Vital signs reviewed and stable  Post vital signs: Reviewed and stable  Last Vitals:  Vitals Value Taken Time  BP 153/35 05/20/2020 2112  Temp 37.6 C 05/17/2020 2100  Pulse 127 05/13/2020 2113  Resp 16 05/11/2020 2113  SpO2 86 % 05/08/2020 2113  Vitals shown include unvalidated device data.  Last Pain:  Vitals:   05/14/2020 2000  TempSrc: Oral  PainSc:          Complications: No complications documented.

## 2020-04-29 NOTE — ED Notes (Signed)
Radiology at the bedside

## 2020-04-29 NOTE — ED Notes (Signed)
Dr. Tomi Bamberger made aware of lactic acid 3.2.

## 2020-04-29 NOTE — ED Provider Notes (Signed)
Care of the patient assumed at the change of shift. Patient with mantle cell cancer and abdominal mass here with groin swelling and SOB. Dimer is elevated. Pending CTA chest and CT abdomen/pelvis after pretreatment for contrast allergy.   Physical Exam  BP (!) 167/90   Pulse (!) 117   Temp 98.1 F (36.7 C) (Oral)   Resp 18   Ht 5\' 4"  (1.626 m)   Wt 97.5 kg   SpO2 97%   BMI 36.90 kg/m   Physical Exam  ED Course/Procedures     Procedures  MDM  Patient's CT results and images reviewed. HE has PE on CTA, continues to be tachycardic and mildly hypoxic but no in distress. CT A/P redemonstrated abdominal mass. Patient denies any prior history of PE/DVT. Will begin heparin drip. Admit to Hospitalist.        Truddie Hidden, MD 04/28/2020 1312

## 2020-04-29 NOTE — Progress Notes (Signed)
[facility] Progress Note Patient Name: Dean Dixon DOB: [birth year] MRN: [id number]   Date of Service  04/25/2020  HPI/Events of Note  Patient with acute onset of respiratory distress in the context of BP of 250 / 100, patient c/o some chest discomfort, and had  tachypnea, and lethargy.  eICU Interventions  Blood pressure was treated with iv Hydralazine + a Nitroglycerine infusion, Troponin was cycled, 12 lead EKG showed sinus tachycardia but no acute evolving ischemic changes, he was placed on BIPAP, stat ABG was obtained and did show reasonable oxygenation and ventilation but due to his inability to keep the BIPAP on (he kept pulling it off), and concern for eventual development of respiratory fatigue, it was decided it would be safer to intubate the patient. Anesthesia was called and they came and intubated the patient.        Kerry Kass Azlin Zilberman 05/03/2020, 9:40 PM

## 2020-04-29 NOTE — Progress Notes (Addendum)
ANTICOAGULATION CONSULT NOTE   Pharmacy Consult for heparin Indication: acute pulmonary embolus  Allergies  Allergen Reactions  . Ivp Dye [Iodinated Diagnostic Agents] Shortness Of Breath    "allergic to CT scan dye", took benadryl with last scan and tolerated well    Patient Measurements: Height: 5\' 4"  (162.6 cm) Weight: 97.5 kg (215 lb) IBW/kg (Calculated) : 59.2 Heparin Dosing Weight: 81 kg  Vital Signs: Temp: 98.1 F (36.7 C) (11/07 0357) Temp Source: Oral (11/07 0357) BP: 159/98 (11/07 1256) Pulse Rate: 127 (11/07 1256)  Labs: Recent Labs    05/19/2020 0503  HGB 11.5*  HCT 35.5*  PLT 100*  CREATININE 1.11    Estimated Creatinine Clearance: 65.3 mL/min (by C-G formula based on SCr of 1.11 mg/dL).   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  Chest CT showed acute PE.  Pharmacy is consulted to start heparin drip for VTE.  Today, 04/30/2020: - hgb 11.5, plts 100k (on chemo) - Ddimer 2.47 - scr 1.11 (crcl~65)  Goal of Therapy:  Heparin level 0.3-0.7 units/ml Monitor platelets by anticoagulation protocol: Yes   Plan:  - heparin 2500 units IV x1 bolus, then 1350 units/hr - check 8 hr heparin level - monitor for s/sx bleeding  Aryahi Denzler P 05/04/2020,1:11 PM

## 2020-04-29 NOTE — Progress Notes (Signed)
Post intubation abg obtained at 2205, but has time stamp of 2019 in epic.  Lab has attempted to correct time stamp, Elink md notified.  Vent settings: PRVC 424ml, rr16, 80%, +5peep.  Results for Dean Dixon, Dean Dixon (MRN [id number]) as of 05/16/2020 22:59  Ref. Range 05/13/2020 20:19  Delivery systems Unknown VENTILATOR  FIO2 Unknown 80.00  Mode Unknown PRESSURE REGULATED VOLUME CONTROL  VT Latest Units: mL 470  Peep/cpap Latest Units: cm H20 5.0  pH, Arterial Latest Ref Range: 7.35 - 7.45  7.385  pCO2 arterial Latest Ref Range: 32 - 48 mmHg 35.2  pO2, Arterial Latest Ref Range: 83 - 108 mmHg 179 (H)  Bicarbonate Latest Ref Range: 20.0 - 28.0 mmol/L 20.6  O2 Saturation Latest Units: % 99.2  Patient temperature Unknown 99.1  Collection site Unknown LEFT RADIAL  Allens test (pass/fail) Latest Ref Range: PASS  PASS  DG CHEST 1 VIEW Unknown Rpt

## 2020-04-29 NOTE — ED Triage Notes (Signed)
Patient states shortness of breath the last few days with increase in groin swelling and redness since his last admission. Patient endorses vomiting. Patient labored with difficulty speaking in full sentences. Patient also endorses nausea and vomiting.

## 2020-04-29 NOTE — Progress Notes (Signed)
[place] for heparin Indication: acute pulmonary embolus  Allergies  Allergen Reactions   Ivp Dye [Iodinated Diagnostic Agents] Shortness Of Breath    "allergic to CT scan dye", took benadryl with last scan and tolerated well    Patient Measurements: Height: 5\' 4"  (162.6 cm) Weight: 94.9 kg (209 lb 3.5 oz) IBW/kg (Calculated) : 59.2 Heparin Dosing Weight: 81 kg  Vital Signs: Temp: 99.7 F (37.6 C) (11/07 2220) Temp Source: Axillary (11/07 2216) BP: 125/74 (11/07 2325) Pulse Rate: 132 (11/07 2340)  Labs: Recent Labs    05/12/2020 0503 04/26/2020 2146 05/15/2020 2315  HGB 11.5* 10.2*  --   HCT 35.5* 30.0*  --   PLT 100*  --   --   HEPARINUNFRC  --   --  0.27*  CREATININE 1.11  --   --   TROPONINIHS  --   --  52*    Estimated Creatinine Clearance: 64.4 mL/min (by C-G formula based on SCr of 1.11 mg/dL).   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  Chest CT showed acute PE.  Pharmacy is consulted to start heparin drip for VTE.  Today, 05/20/2020: - hgb 11.5, plts 100k (on chemo) - Ddimer 2.47 - scr 1.11 (crcl~65) - 2315 HL 0.27 subtherapeutic - no bleeding noted  Goal of Therapy:  Heparin level 0.3-0.7 units/ml Monitor platelets by anticoagulation protocol: Yes   Plan:  - increase heparin drip to 1500 units/hr - check 8 hr heparin level - daily CBC - monitor for s/sx bleeding  Dolly Rias RPh 04/23/2020, 11:59 PM

## 2020-04-29 NOTE — ED Notes (Signed)
Date and time results received: 05/07/2020 0617  (use smartphrase ".now" to insert current time)  Test: lactic acid Critical Value: 3.2  Name of Provider Notified: knapp   Orders Received? Or Actions Taken?: no new orders at this time.

## 2020-04-29 NOTE — Consult Note (Signed)
NAME:  Dean Dixon, MRN:  [id number], DOB:  December 21, 1950, LOS: 0 ADMISSION DATE:  05/17/2020, CONSULTATION DATE:  11/7/201 REFERRING MD:  [facility], CHIEF COMPLAINT:  Respiratory distress  Brief History   69 year old male with stage IV mantle cell lymphoma on CHOP presenting with N/V and progressive SOB over the last 2 weeks found to have acute left segmental PE.  Admitted to [facility] however developed hypertension 250/100 with respiratory distress with chest pain, unable to tolerate BiPAP and was intubated.     History of present illness   HPI obtained from medical chart review as patient is currently intubated and sedated on mechanical ventilation.  69 year old male with prior medical history of stage IV mantle cell lymphoma (diagnosed 06/20/2019) on CHOP, hypertension, diabetes type 2  who presented to [facility] on 11/7 with progressive shortness of breath over the last 2 weeks, worse over the last 2 days with above baseline abdominal distention, non bloody nausea/ vomiting, nonradiating intermittent midsternal chest tightness, and cough with lightly productive greenish nonbloody sputum.  Additionally reported ongoing swelling in his legs and testicles causing difficulty with urination.  Recent hospitalization 10/28 - 11/1 found to have new left upper quadrant abdominal mass and hypercalcemia.    In ER, he was afebrile, hypertensive, mildly tachypneic, and tachycardic with oxygen saturations of 98% on room air.  Noted to have multiple episodes of vomiting and retching treated with zofran.  KUB with non-obstructive gas pattern.   Labs noted for glucose 290 with HgbA1c 6.3, BUN 40, AG 17, lactic acid 3.2-> 2.3, troponin hs 52, BNP 133, D-dimer 2.47, Hgb 11.5/ HCT 35.5, platelets 100, neg COVID/ flu, UA noted turbid urine with 5 ketones, negative for leukocytes/ nitrates.  CXR showed small bilateral pleural effusion with atelectasis and low lung volumes.  He was pretreated given his contrast allergy. CTA PE and  CT abd/ pelvis showed segmental nonocclusive left pulmonary embolism without associated hemorrhage or pulmonary infarct.  Additionally noted bulky soft tissue mass in LUQ with extensive lymphadenopathy in the chest and abd with evidence of widespread intraperitoneal metastatic disease, small bilateral pleural effusion, and  tracheobronchomalacia. Started on heparin gtt per pharmacy.   He was admitted by [facility] to progressive care.  In unit, he was noted to become hypertensive with BP 250/100 with complaints of chest discomfort with noted tachypnea and lethargy.  He was treated with hydralazine and placed on nitro gtt for blood pressure management.  Repeated EKG was non acute.  ABG on NRB noted 7.291/ 56.7/ 275.  Bipap was attempted however patient kept pulling it off and therefore given concern for impending respiratory failure, he was intubated by anesthesia.  PCCM consulted for further management.   Past Medical History  Mantle cell lymphoma on CHOP, DM, HTN  Significant Hospital Events   11/7 Admitted to [facility]/ decompensated/ intubated  Consults:  PCCM 11/7  Procedures:  11/7 ETT >>  Significant Diagnostic Tests:  11/7 KUB >> nonobstructive gas pattern  11/7 CTA PE/  CT abd/ pelvis >> 1. Study is positive for segmental sized pulmonary emboli in the left lung. These appear nonocclusive at this time, and there is no associated pulmonary hemorrhage to suggest frank pulmonary infarct. 2. Bulky soft tissue mass in the left upper quadrant with extensive lymphadenopathy in the chest and abdomen, and evidence of widespread intraperitoneal metastatic disease, as detailed above. 3. Small bilateral pleural effusions lying dependently. 4. Aortic atherosclerosis. 5. Tracheobronchomalacia. 6. Cholelithiasis. 7. Colonic diverticulosis.  Micro Data:  11/7 SARS  2/ flu >> neg 11/7 MRSA >> neg  Antimicrobials:  None (previously on azithro/ ceftriaxone, stopped 10/30)  Interim history/subjective:  As  above  Objective   Blood pressure 125/74, pulse (!) 131, temperature 99.7 F (37.6 C), resp. rate (!) 24, height _0  (1.626 m), weight 94.9 kg, SpO2 100 %.    Vent Mode: PRVC FiO2 (%):  [80 %] 80 % Set Rate:  [16 bmp] 16 bmp Vt Set:  [470 mL] 470 mL PEEP:  [5 cmH20] 5 cmH20 Plateau Pressure:  [22 cmH20] 22 cmH20   Intake/Output Summary (Last 24 hours) at 05/15/2020 2335 Last data filed at 04/28/2020 2327 Gross per 24 hour  Intake 525.39 ml  Output --  Net 525.39 ml   Filed Weights   05/05/2020 0357 04/28/2020 1826  Weight: 97.5 kg 94.9 kg   Physical Exam: General: Elderly-appearing, sedated HENT: [place], AT, ETT in place Eyes: EOMI, no scleral icterus Respiratory: Coarse breath sounds bilaterally Cardiovascular: Tachycardic, regular rhythm, -M/R/G, no JVD GI: Distended, BS+, soft, nontender Extremities:1+ pitting edema in lower extremities,-tenderness Neuro: [facility] Problem list    Assessment & Plan:  69 year old male with metastatic mantle cell lymphoma currently on chemotherapy who presented with N/V and shortness of breath x 2 weeks and found with acute PE. After admission, developed hypertensive emergency with respiratory distress requiring intubation.   Acute hypoxic respiratory failure  Risk for aspiration: Low threshold to start antibiotics if patient deteriorates P:  Full vent support. ABG reviewed. Wean FIO2 CXR- essentially unchanged, bilateral pleural effusions with atelectasis, ETT aoove carina, OGT below hemidiaphragm  VAP bundle  Daily SBT  PAD protocol with propofol and prn fentanyl for RASS goal 0/-1  Left segmental PE  - provoked in the setting of active malignancy  P:  Heparin gtt per pharmacy  TTE pending Check LE dopplers  Trend troponin  Hypertensive emergency with chest pain P:  Tele monitoring Off NTG EKG unchanged Trend troponin hs TTE pending   Dark non-bloody gastric output P: NPO OG tube to intermittent  suction  Mild DKA Hx DMT2 - beta-hydroxybutyric acid elevated at 1.05 P: Recheck labs now CBG q4h Consider Endotool if indicated  AGMA/ lactic acidosis P:  Trend renal indices  Insert foley Strict I/Os  Stage IV mantle cell lymphoma with LUQ mass/ extensive metastatic disease - last CHOP 10/30 - follows with Dr. Irene Limbo P:  Oncology consulted by [facility]   Best practice:  Diet: NPO Pain/Anxiety/Delirium protocol (if indicated): Propofol VAP protocol (if indicated): Yes DVT prophylaxis: Systemic heparin GI prophylaxis: PPI Glucose control: CBG q4h Mobility: BR Code Status: Full code Family Communication: Unable to reach family Disposition: Remain in ICU  Labs   CBC: Recent Labs  Lab 04/23/20 0544 05/04/2020 0503 05/01/2020 2146  WBC 12.3* 4.3  --   NEUTROABS 8.7* 0.7*  --   HGB 10.6* 11.5* 10.2*  HCT 34.2* 35.5* 30.0*  MCV 91.2 86.8  --   PLT 161 100*  --     Basic Metabolic Panel: Recent Labs  Lab 04/23/20 0544 05/10/2020 0503 05/17/2020 2146  NA 141 139 140  K 3.5 4.8 5.0  CL 105 99  --   CO2 24 23  --   GLUCOSE 148* 290*  --   BUN 23 40*  --   CREATININE 0.99 1.11  --   CALCIUM 8.5* 10.6*  --    GFR: Estimated Creatinine Clearance: 64.4 mL/min (by C-G formula based on SCr of 1.11 mg/dL).  Recent Labs  Lab 04/23/20 0544 05/07/2020 0503 05/05/2020 0606  WBC 12.3* 4.3  --   LATICACIDVEN  --  3.2* 2.3*    Liver Function Tests: Recent Labs  Lab 04/23/20 0544 05/19/2020 0503  AST 19 19  ALT 15 18  ALKPHOS 64 108  BILITOT 0.7 0.9  PROT 5.6* 5.9*  ALBUMIN 3.4* 3.8   No results for input(s): LIPASE, AMYLASE in the last 168 hours. No results for input(s): AMMONIA in the last 168 hours.  ABG    Component Value Date/Time   PHART 7.282 (L) 05/07/2020 2146   PCO2ART 58.2 (H) 05/10/2020 2146   PO2ART 278 (H) 04/25/2020 2146   HCO3 27.3 05/13/2020 2146   TCO2 29 05/04/2020 2146   O2SAT 100.0 04/30/2020 2146     Coagulation Profile: No results for  input(s): INR, PROTIME in the last 168 hours.  Cardiac Enzymes: No results for input(s): CKTOTAL, CKMB, CKMBINDEX, TROPONINI in the last 168 hours.  HbA1C: Hgb A1c MFr Bld  Date/Time Value Ref Range Status  04/28/2020 05:49 PM 6.3 (H) 4.8 - 5.6 % Final    Comment:    (NOTE) Pre diabetes:          5.7%-6.4%  Diabetes:              >6.4%  Glycemic control for   <7.0% adults with diabetes   11/14/2019 06:13 AM 5.4 4.8 - 5.6 % Final    Comment:    (NOTE) Pre diabetes:          5.7%-6.4% Diabetes:              >6.4% Glycemic control for   <7.0% adults with diabetes     CBG: Recent Labs  Lab 04/23/20 0713 04/23/20 1213 05/19/2020 2007 04/30/2020 2331  GLUCAP 122* 145* 255* 272*    Review of Systems:   Unable to obtain due to intubated status  Past Medical History  He,  has a past medical history of Cancer ([place]), Diabetes mellitus without complication ([place]), and Hypertension.   Surgical History    Past Surgical History:  Procedure Laterality Date  . APPENDECTOMY    . TONSILLECTOMY       Social History   reports that he has never smoked. He has never used smokeless tobacco. He reports current alcohol use of about 2.0 standard drinks of alcohol per week. He reports that he does not use drugs.   Family History   His family history is not on file.   Allergies Allergies  Allergen Reactions  . Ivp Dye [Iodinated Diagnostic Agents] Shortness Of Breath    "allergic to CT scan dye", took benadryl with last scan and tolerated well     Home Medications  Prior to Admission medications   Medication Sig Start Date End Date Taking? Authorizing Provider  allopurinol (ZYLOPRIM) 300 MG tablet Take 1 tablet (300 mg total) by mouth daily. 04/24/20 05/24/20 Yes Ghimire, Dante Gang, MD  amLODipine (NORVASC) 10 MG tablet Take 10 mg by mouth daily.   Yes [provider]  insulin detemir (LEVEMIR) 100 UNIT/ML FlexPen Inject 10 Units into the skin at bedtime for 5 days. Patient  taking differently: Inject 11 Units into the skin at bedtime.  11/17/19 05/15/2020 Yes Mariel Aloe, MD  ramipril (ALTACE) 10 MG capsule Take 10 mg by mouth daily.   Yes [provider]  blood glucose meter kit and supplies Check blood sugar 4 times daily (Before breakfast, lunch, dinner, bedtime). (FOR ICD-10 E10.9,  E11.9). 11/17/19   Mariel Aloe, MD  Insulin Pen Needle 31G X 5 MM MISC BD Pen Needles- brand specific Inject insulin via insulin pen 6 x daily 11/17/19   Mariel Aloe, MD  prochlorperazine (COMPAZINE) 10 MG tablet Take 1 tablet (10 mg total) by mouth every 6 (six) hours as needed (Nausea or vomiting). Patient not taking: Reported on 04/19/2020 06/20/19 04/19/20  Brunetta Genera, MD     Critical care time: 36 min      The patient is critically ill with multiple organ systems failure and requires high complexity decision making for assessment and support, frequent evaluation and titration of therapies, application of advanced monitoring technologies and extensive interpretation of multiple databases.  Rodman Pickle, M.D. [facility] Pulmonary/Critical Care Medicine 04/30/2020 12:54 AM   Please see Amion for pager number to reach on-call Pulmonary and Critical Care Team.

## 2020-04-29 NOTE — ED Notes (Signed)
Patient provided 2L O2 for comfort
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
## 2020-04-30 ENCOUNTER — Other Ambulatory Visit: Payer: Medicare Other

## 2020-04-30 ENCOUNTER — Ambulatory Visit: Payer: Medicare Other | Admitting: Hematology

## 2020-04-30 ENCOUNTER — Inpatient Hospital Stay (HOSPITAL_COMMUNITY): Payer: Medicare Other

## 2020-04-30 DIAGNOSIS — R0602 Shortness of breath: Secondary | ICD-10-CM | POA: Diagnosis not present

## 2020-04-30 DIAGNOSIS — Z789 Other specified health status: Secondary | ICD-10-CM | POA: Diagnosis not present

## 2020-04-30 DIAGNOSIS — I2699 Other pulmonary embolism without acute cor pulmonale: Secondary | ICD-10-CM

## 2020-04-30 DIAGNOSIS — J9601 Acute respiratory failure with hypoxia: Secondary | ICD-10-CM | POA: Diagnosis not present

## 2020-04-30 DIAGNOSIS — I2693 Single subsegmental pulmonary embolism without acute cor pulmonale: Secondary | ICD-10-CM | POA: Diagnosis not present

## 2020-04-30 DIAGNOSIS — C8318 Mantle cell lymphoma, lymph nodes of multiple sites: Secondary | ICD-10-CM | POA: Diagnosis not present

## 2020-04-30 DIAGNOSIS — R0600 Dyspnea, unspecified: Secondary | ICD-10-CM

## 2020-04-30 DIAGNOSIS — R0689 Other abnormalities of breathing: Secondary | ICD-10-CM

## 2020-04-30 DIAGNOSIS — L8990 Pressure ulcer of unspecified site, unspecified stage: Secondary | ICD-10-CM | POA: Insufficient documentation

## 2020-04-30 DIAGNOSIS — R140 Abdominal distension (gaseous): Secondary | ICD-10-CM

## 2020-04-30 LAB — CBC
HCT: 30.1 % — ABNORMAL LOW (ref 39.0–52.0)
HCT: 25.9 % — ABNORMAL LOW (ref 39.0–52.0)
Hemoglobin: 9.6 g/dL — ABNORMAL LOW (ref 13.0–17.0)
Hemoglobin: 8.3 g/dL — ABNORMAL LOW (ref 13.0–17.0)
MCH: 28.1 pg (ref 26.0–34.0)
MCH: 28.4 pg (ref 26.0–34.0)
MCHC: 31.9 g/dL (ref 30.0–36.0)
MCHC: 32 g/dL (ref 30.0–36.0)
MCV: 88 fL (ref 80.0–100.0)
MCV: 88.7 fL (ref 80.0–100.0)
Platelets: 82 10*3/uL — ABNORMAL LOW (ref 150–400)
Platelets: 69 10*3/uL — ABNORMAL LOW (ref 150–400)
RBC: 3.42 MIL/uL — ABNORMAL LOW (ref 4.22–5.81)
RBC: 2.92 MIL/uL — ABNORMAL LOW (ref 4.22–5.81)
RDW: 17 % — ABNORMAL HIGH (ref 11.5–15.5)
RDW: 17.1 % — ABNORMAL HIGH (ref 11.5–15.5)
WBC: 2.5 10*3/uL — ABNORMAL LOW (ref 4.0–10.5)
WBC: 1.7 10*3/uL — ABNORMAL LOW (ref 4.0–10.5)
nRBC: 3.2 % — ABNORMAL HIGH (ref 0.0–0.2)
nRBC: 1.8 % — ABNORMAL HIGH (ref 0.0–0.2)

## 2020-04-30 LAB — GLUCOSE, CAPILLARY
Glucose-Capillary: 219 mg/dL — ABNORMAL HIGH (ref 70–99)
Glucose-Capillary: 166 mg/dL — ABNORMAL HIGH (ref 70–99)
Glucose-Capillary: 185 mg/dL — ABNORMAL HIGH (ref 70–99)
Glucose-Capillary: 166 mg/dL — ABNORMAL HIGH (ref 70–99)
Glucose-Capillary: 174 mg/dL — ABNORMAL HIGH (ref 70–99)
Glucose-Capillary: 169 mg/dL — ABNORMAL HIGH (ref 70–99)

## 2020-04-30 LAB — COMPREHENSIVE METABOLIC PANEL
ALT: 17 U/L (ref 0–44)
AST: 19 U/L (ref 15–41)
Albumin: 3 g/dL — ABNORMAL LOW (ref 3.5–5.0)
Alkaline Phosphatase: 70 U/L (ref 38–126)
Anion gap: 11 (ref 5–15)
BUN: 57 mg/dL — ABNORMAL HIGH (ref 8–23)
CO2: 24 mmol/L (ref 22–32)
Calcium: 8.2 mg/dL — ABNORMAL LOW (ref 8.9–10.3)
Chloride: 107 mmol/L (ref 98–111)
Creatinine, Ser: 1.76 mg/dL — ABNORMAL HIGH (ref 0.61–1.24)
GFR, Estimated: 41 mL/min — ABNORMAL LOW (ref >60)
Glucose, Bld: 236 mg/dL — ABNORMAL HIGH (ref 70–99)
Potassium: 5.9 mmol/L — ABNORMAL HIGH (ref 3.5–5.1)
Sodium: 142 mmol/L (ref 135–145)
Total Bilirubin: 0.4 mg/dL (ref 0.3–1.2)
Total Protein: 5 g/dL — ABNORMAL LOW (ref 6.5–8.1)

## 2020-04-30 LAB — BASIC METABOLIC PANEL
Anion gap: 11 (ref 5–15)
BUN: 66 mg/dL — ABNORMAL HIGH (ref 8–23)
CO2: 23 mmol/L (ref 22–32)
Calcium: 7.7 mg/dL — ABNORMAL LOW (ref 8.9–10.3)
Chloride: 109 mmol/L (ref 98–111)
Creatinine, Ser: 2.1 mg/dL — ABNORMAL HIGH (ref 0.61–1.24)
GFR, Estimated: 33 mL/min — ABNORMAL LOW (ref >60)
Glucose, Bld: 203 mg/dL — ABNORMAL HIGH (ref 70–99)
Potassium: 5.1 mmol/L (ref 3.5–5.1)
Sodium: 143 mmol/L (ref 135–145)

## 2020-04-30 LAB — ECHOCARDIOGRAM LIMITED
Height: 64 in
S' Lateral: 2.6 cm
Weight: 3280.44 oz

## 2020-04-30 LAB — HEPARIN LEVEL (UNFRACTIONATED)
Heparin Unfractionated: 0.65 IU/mL (ref 0.30–0.70)
Heparin Unfractionated: 0.74 IU/mL — ABNORMAL HIGH (ref 0.30–0.70)

## 2020-04-30 LAB — LACTIC ACID, PLASMA: Lactic Acid, Venous: 1.9 mmol/L (ref 0.5–1.9)

## 2020-04-30 MED ORDER — AMLODIPINE BESYLATE 10 MG PO TABS
10.0000 mg | ORAL_TABLET | Freq: Every day | ORAL | Status: DC
  Administered 2020-04-30 – 2020-05-18 (×14): 10 mg
  Filled 2020-04-30 (×2): qty 2
  Filled 2020-04-30: qty 1
  Filled 2020-04-30 (×2): qty 2
  Filled 2020-04-30: qty 1
  Filled 2020-04-30: qty 2
  Filled 2020-04-30: qty 1
  Filled 2020-04-30: qty 2
  Filled 2020-04-30 (×2): qty 1
  Filled 2020-04-30: qty 2
  Filled 2020-04-30 (×3): qty 1

## 2020-04-30 MED ORDER — FUROSEMIDE 10 MG/ML IJ SOLN
40.0000 mg | Freq: Once | INTRAMUSCULAR | Status: AC
  Administered 2020-04-30: 40 mg via INTRAVENOUS
  Filled 2020-04-30: qty 4

## 2020-04-30 MED ORDER — AMLODIPINE BESYLATE 5 MG PO TABS: 10.0000 mg | ORAL_TABLET | Freq: Every day | ORAL | Status: DC

## 2020-04-30 MED ORDER — HEPARIN (PORCINE) 25000 UT/250ML-% IV SOLN
1500.0000 [IU]/h | INTRAVENOUS | Status: DC
  Administered 2020-04-30 (×2): 1500 [IU]/h via INTRAVENOUS

## 2020-04-30 MED ORDER — SODIUM CHLORIDE 0.9 % IV SOLN: INTRAVENOUS | Status: DC | PRN

## 2020-04-30 MED ORDER — PROPOFOL 10 MG/ML IV BOLUS
INTRAVENOUS | Status: AC
  Filled 2020-04-30: qty 20

## 2020-04-30 MED ORDER — ALLOPURINOL 300 MG PO TABS
300.0000 mg | ORAL_TABLET | Freq: Every day | ORAL | Status: DC
  Administered 2020-04-30: 300 mg
  Filled 2020-04-30 (×4): qty 1

## 2020-04-30 MED ORDER — SODIUM CHLORIDE 0.9% FLUSH: 10.0000 mL | INTRAVENOUS | Status: DC | PRN

## 2020-04-30 MED ORDER — SUCCINYLCHOLINE CHLORIDE 200 MG/10ML IV SOSY
PREFILLED_SYRINGE | INTRAVENOUS | Status: AC
  Filled 2020-04-30: qty 10

## 2020-04-30 MED ORDER — INSULIN ASPART 100 UNIT/ML ~~LOC~~ SOLN
0.0000 [IU] | SUBCUTANEOUS | Status: DC
  Administered 2020-04-30 – 2020-05-01 (×4): 3 [IU] via SUBCUTANEOUS
  Administered 2020-05-01 (×2): 2 [IU] via SUBCUTANEOUS
  Administered 2020-05-01 (×2): 3 [IU] via SUBCUTANEOUS
  Administered 2020-05-02: 2 [IU] via SUBCUTANEOUS
  Administered 2020-05-02: 5 [IU] via SUBCUTANEOUS
  Administered 2020-05-02 (×3): 3 [IU] via SUBCUTANEOUS
  Administered 2020-05-02 – 2020-05-03 (×4): 2 [IU] via SUBCUTANEOUS
  Administered 2020-05-03: 3 [IU] via SUBCUTANEOUS
  Administered 2020-05-03: 2 [IU] via SUBCUTANEOUS
  Administered 2020-05-03: 3 [IU] via SUBCUTANEOUS
  Administered 2020-05-04: 2 [IU] via SUBCUTANEOUS
  Administered 2020-05-04: 3 [IU] via SUBCUTANEOUS
  Administered 2020-05-04: 2 [IU] via SUBCUTANEOUS
  Administered 2020-05-04: 3 [IU] via SUBCUTANEOUS
  Administered 2020-05-04: 2 [IU] via SUBCUTANEOUS
  Administered 2020-05-05: 3 [IU] via SUBCUTANEOUS
  Administered 2020-05-05: 8 [IU] via SUBCUTANEOUS
  Administered 2020-05-05 (×3): 2 [IU] via SUBCUTANEOUS
  Administered 2020-05-05 – 2020-05-06 (×4): 3 [IU] via SUBCUTANEOUS
  Administered 2020-05-06 – 2020-05-08 (×7): 2 [IU] via SUBCUTANEOUS
  Administered 2020-05-08 (×2): 3 [IU] via SUBCUTANEOUS
  Administered 2020-05-08: 5 [IU] via SUBCUTANEOUS
  Administered 2020-05-09: 3 [IU] via SUBCUTANEOUS
  Administered 2020-05-09: 5 [IU] via SUBCUTANEOUS
  Administered 2020-05-09 – 2020-05-10 (×2): 2 [IU] via SUBCUTANEOUS
  Administered 2020-05-10: 5 [IU] via SUBCUTANEOUS
  Administered 2020-05-10 (×2): 3 [IU] via SUBCUTANEOUS
  Administered 2020-05-10 – 2020-05-11 (×2): 2 [IU] via SUBCUTANEOUS
  Administered 2020-05-11: 3 [IU] via SUBCUTANEOUS
  Administered 2020-05-11: 2 [IU] via SUBCUTANEOUS
  Administered 2020-05-12: 3 [IU] via SUBCUTANEOUS
  Administered 2020-05-12 (×2): 2 [IU] via SUBCUTANEOUS
  Administered 2020-05-12: 3 [IU] via SUBCUTANEOUS
  Administered 2020-05-13: 2 [IU] via SUBCUTANEOUS
  Administered 2020-05-13: 3 [IU] via SUBCUTANEOUS
  Administered 2020-05-14 – 2020-05-15 (×4): 2 [IU] via SUBCUTANEOUS
  Administered 2020-05-15: 3 [IU] via SUBCUTANEOUS
  Administered 2020-05-15: 2 [IU] via SUBCUTANEOUS
  Administered 2020-05-15: 3 [IU] via SUBCUTANEOUS
  Administered 2020-05-16 (×4): 2 [IU] via SUBCUTANEOUS
  Administered 2020-05-17: 3 [IU] via SUBCUTANEOUS
  Administered 2020-05-17 (×3): 2 [IU] via SUBCUTANEOUS
  Administered 2020-05-18 (×2): 3 [IU] via SUBCUTANEOUS
  Administered 2020-05-18: 2 [IU] via SUBCUTANEOUS

## 2020-04-30 MED ORDER — SODIUM CHLORIDE 0.9 % IV SOLN
2.0000 g | Freq: Two times a day (BID) | INTRAVENOUS | Status: DC
  Administered 2020-04-30 – 2020-05-02 (×4): 2 g via INTRAVENOUS
  Filled 2020-04-30 (×4): qty 2

## 2020-04-30 MED ORDER — PHENYLEPHRINE 40 MCG/ML (10ML) SYRINGE FOR IV PUSH (FOR BLOOD PRESSURE SUPPORT)
PREFILLED_SYRINGE | INTRAVENOUS | Status: AC
  Filled 2020-04-30: qty 10

## 2020-04-30 MED ORDER — ALLOPURINOL 300 MG PO TABS
300.0000 mg | ORAL_TABLET | Freq: Every day | ORAL | Status: DC
  Filled 2020-04-30: qty 3

## 2020-04-30 MED ORDER — GUAIFENESIN 100 MG/5ML PO SOLN
10.0000 mL | ORAL | Status: DC | PRN
  Administered 2020-05-08 – 2020-05-09 (×3): 200 mg
  Filled 2020-04-30 (×4): qty 10

## 2020-04-30 MED ORDER — ACETAMINOPHEN 650 MG RE SUPP
650.0000 mg | Freq: Four times a day (QID) | RECTAL | Status: DC | PRN
  Administered 2020-05-01: 650 mg via RECTAL
  Filled 2020-04-30: qty 1

## 2020-04-30 MED ORDER — FENTANYL 2500MCG IN NS 250ML (10MCG/ML) PREMIX INFUSION
0.0000 ug/h | INTRAVENOUS | Status: DC
  Administered 2020-04-30: 25 ug/h via INTRAVENOUS
  Filled 2020-04-30: qty 250

## 2020-04-30 MED ORDER — SODIUM CHLORIDE 0.9% FLUSH
10.0000 mL | Freq: Two times a day (BID) | INTRAVENOUS | Status: DC
  Administered 2020-04-30: 10 mL
  Administered 2020-04-30: 30 mL
  Administered 2020-05-01: 10 mL
  Administered 2020-05-02: 40 mL
  Administered 2020-05-04 – 2020-05-05 (×3): 10 mL
  Administered 2020-05-05: 20 mL
  Administered 2020-05-06 – 2020-05-21 (×23): 10 mL

## 2020-04-30 MED ORDER — ACETAMINOPHEN 325 MG PO TABS
650.0000 mg | ORAL_TABLET | Freq: Four times a day (QID) | ORAL | Status: DC | PRN
  Administered 2020-04-30 – 2020-05-17 (×9): 650 mg
  Filled 2020-04-30 (×10): qty 2

## 2020-04-30 MED ORDER — HEPARIN (PORCINE) 25000 UT/250ML-% IV SOLN
1250.0000 [IU]/h | INTRAVENOUS | Status: DC
  Administered 2020-05-01: 1250 [IU]/h via INTRAVENOUS
  Administered 2020-05-01: 1300 [IU]/h via INTRAVENOUS
  Filled 2020-04-30 (×2): qty 250

## 2020-04-30 MED ORDER — PANTOPRAZOLE SODIUM 40 MG IV SOLR
40.0000 mg | INTRAVENOUS | Status: DC
  Administered 2020-04-30 – 2020-05-06 (×7): 40 mg via INTRAVENOUS
  Filled 2020-04-30 (×8): qty 40

## 2020-04-30 NOTE — Progress Notes (Addendum)
HEMATOLOGY-ONCOLOGY PROGRESS NOTE  SUBJECTIVE:   Chart reviewed and events noted.  The patient is intubated and in the ICU.  He opens his eyes and can shake his head yes/no.  He indicates that he is not in any pain.  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 ([place])   06/29/2019 - 10/20/2019 Chemotherapy   The patient had dexamethasone (DECADRON) 4 MG tablet, 8 mg, Oral, Daily, 1 of 1 cycle, Start date: 06/20/2019, End date: 11/17/2019 palonosetron (ALOXI) injection 0.25 mg, 0.25 mg, Intravenous,  Once, 5 of 5 cycles Administration: 0.25 mg (07/27/2019), 0.25 mg (06/30/2019), 0.25 mg (08/24/2019), 0.25 mg (10/20/2019), 0.25 mg (09/21/2019) pegfilgrastim (NEULASTA ONPRO KIT) injection 6 mg, 6 mg, Subcutaneous, Once, 3 of 3 cycles Administration: 6 mg (08/25/2019), 6 mg (09/22/2019) bendamustine (BENDEKA) 200 mg in sodium chloride 0.9 % 50 mL (3.4483 mg/mL) chemo infusion, 90 mg/m2 = 200 mg, Intravenous,  Once, 5 of 5 cycles Administration: 200 mg (06/29/2019), 200 mg (06/30/2019), 200 mg (07/27/2019), 200 mg (07/28/2019), 200 mg (08/24/2019), 200 mg (08/25/2019), 200 mg (09/21/2019), 200 mg (09/22/2019) riTUXimab-pvvr (RUXIENCE) 800 mg in sodium chloride 0.9 % 250 mL (2.4242 mg/mL) infusion, 375 mg/m2 = 800 mg, Intravenous,  Once, 5 of 5 cycles Dose modification: 375 mg/m2 (original dose 375 mg/m2, Cycle 2), 400 mg (original dose 375 mg/m2, Cycle 2, Reason: Other (see comments), Comment: finishing rituximab from previous day, infusion not completed), 300 mg (original dose 300 mg, Cycle 4) Administration: 800 mg (06/29/2019), 800 mg (07/27/2019), 800 mg (08/24/2019), 400 mg (07/28/2019), 800 mg (10/20/2019), 800 mg (09/21/2019), 300 mg (09/22/2019)  for chemotherapy treatment.    04/20/2020 -  Chemotherapy   The patient had DOXOrubicin (ADRIAMYCIN) chemo injection 100 mg, 48 mg/m2 = 104 mg, Intravenous,  Once, 1 of 8 cycles Administration: 100 mg  (04/21/2020) palonosetron (ALOXI) injection 0.25 mg, 0.25 mg, Intravenous,  Once, 1 of 8 cycles Administration: 0.25 mg (04/21/2020) pegfilgrastim-cbqv (UDENYCA) injection 6 mg, 6 mg, Subcutaneous, Once, 1 of 1 cycle Administration: 6 mg (04/24/2020) vinCRIStine (ONCOVIN) 2 mg in sodium chloride 0.9 % 50 mL chemo infusion, 2 mg, Intravenous,  Once, 1 of 8 cycles Administration: 2 mg (04/21/2020) cyclophosphamide (CYTOXAN) 1,560 mg in sodium chloride 0.9 % 250 mL chemo infusion, 750 mg/m2 = 1,560 mg, Intravenous,  Once, 1 of 8 cycles Administration: 1,560 mg (04/21/2020) fosaprepitant (EMEND) 150 mg in sodium chloride 0.9 % 145 mL IVPB, 150 mg, Intravenous,  Once, 1 of 8 cycles Administration: 150 mg (04/21/2020)  for chemotherapy treatment.       REVIEW OF SYSTEMS:   Unable to obtain a comprehensive review of systems secondary to patient condition  PHYSICAL EXAMINATION: ECOG PERFORMANCE STATUS: 2 - Symptomatic, <50% confined to bed  Vitals:   04/30/20 1100 04/30/20 1124  BP: (!) 200/70   Pulse: (!) 117   Resp: 19   Temp: (!) 100.6 F (38.1 C)   SpO2: 100% 100%   Filed Weights   05/16/2020 0357 04/30/2020 1826 04/30/20 0315  Weight: 97.5 kg 94.9 kg 93 kg    Intake/Output from previous day: 11/07 0701 - 11/08 0700 In: 1508.3 [I.V.:1508.3] Out: 775 [Urine:125; Emesis/NG output:650]  GENERAL: Opens eyes, sedated secondary to fentanyl SKIN: skin color, texture, turgor are normal, no rashes or significant lesions EYES: normal, Conjunctiva are pink and non-injected, sclera clear OROPHARYNX: ETT in place LUNGS: Clear HEART: regular rate & rhythm and no murmurs, 2+ bilateral  lower extremity edema ABDOMEN: Significant abdominal distention NEURO: Sedated but opens eyes  LABORATORY DATA:  I have reviewed the data as listed CMP Latest Ref Rng & Units 04/30/2020 05/09/2020 04/27/2020  Glucose 70 - 99 mg/dL 236(H) - 290(H)  BUN 8 - 23 mg/dL 57(H) - 40(H)  Creatinine 0.61 - 1.24 mg/dL  1.76(H) - 1.11  Sodium 135 - 145 mmol/L 142 140 139  Potassium 3.5 - 5.1 mmol/L 5.9(H) 5.0 4.8  Chloride 98 - 111 mmol/L 107 - 99  CO2 22 - 32 mmol/L 24 - 23  Calcium 8.9 - 10.3 mg/dL 8.2(L) - 10.6(H)  Total Protein 6.5 - 8.1 g/dL 5.0(L) - 5.9(L)  Total Bilirubin 0.3 - 1.2 mg/dL 0.4 - 0.9  Alkaline Phos 38 - 126 U/L 70 - 108  AST 15 - 41 U/L 19 - 19  ALT 0 - 44 U/L 17 - 18   . CBC Latest Ref Rng & Units 04/30/2020 04/24/2020 05/05/2020  WBC 4.0 - 10.5 K/uL 1.7(L) 2.5(L) -  Hemoglobin 13.0 - 17.0 g/dL 8.3(L) 9.6(L) 10.2(L)  Hematocrit 39 - 52 % 25.9(L) 30.1(L) 30.0(L)  Platelets 150 - 400 K/uL 69(L) 82(L) -    Lab Results  Component Value Date   WBC 1.7 (L) 04/30/2020   HGB 8.3 (L) 04/30/2020   HCT 25.9 (L) 04/30/2020   MCV 88.7 04/30/2020   PLT 69 (L) 04/30/2020   NEUTROABS 0.7 (L) 04/25/2020    CT ABDOMEN PELVIS WO CONTRAST  Addendum Date: 04/19/2020   ADDENDUM REPORT: 04/19/2020 12:54 ADDENDUM: The peritoneal fluid within the pelvis is not enhancing as this is a non IV contrast imaging exam. Therefore would described fluid collection as having thickened rim. Favor peritoneal metastasis. Findings conveyed toSCOTT GOLDSTON on 04/19/2020  at12:54. Electronically Signed   By: Suzy Bouchard M.D.   On: 04/19/2020 12:54   Result Date: 04/19/2020 CLINICAL DATA:  Abdominal pain.  Tachycardia.  Mantle cell lymphoma EXAM: CT ABDOMEN AND PELVIS WITHOUT CONTRAST TECHNIQUE: Multidetector CT imaging of the abdomen and pelvis was performed following the standard protocol without IV contrast. COMPARISON:  PET-CT scan 01/02/2020 FINDINGS: Lower chest: New small RIGHT effusion. No pericardial effusion. There is new precordial adenopathy anterior to the RIGHT heart and liver (image 9/2). Hepatobiliary: New intraperitoneal free fluid along the margin of the RIGHT hepatic lobe. This fluid is simple attenuation. The liver appears unchanged on noncontrast exam. Large cyst the LEFT hepatic lobe.  Gallbladder normal. Pancreas: Pancreas is grossly normal noncontrast exam Spleen: . spleen is unchanged Adrenals/urinary tract: Adrenal glands kidneys and ureters normal. Bladder Stomach/Bowel: Stomach duodenum normal. There is a new mass lesion in the LEFT upper quadrant measuring 14.2 by 13.0 cm. The mass may be associated the small bowel or the small bowel mesentery. The descending colon is also involving the mass (image 43/2. There is extensive fluid within the leaves of the mesentery of the distal small bowel. Appendix normal. Ascending transverse colon normal. The descending colon tracks along the new LEFT upper quadrant mass. Vascular/Lymphatic: New large LEFT upper quadrant mass either represents lymphoma involving the small bowel or mesentery. Mass is very large described the stomach section. Additionally there is new periportal adenopathy with enlarged lymph nodes along the aorta measuring up to 2 cm (image 44/2. There is nodularity along the greater omentum abdominal on the LEFT. Reproductive: Prostate unremarkable Other: Small volume free fluid the pelvis. There is enhancing rim to the fluid (image 73/2 which could indicate peritonitis. Musculoskeletal: No aggressive osseous lesion. IMPRESSION:  1. Dominant finding is new large (15 cm) lymphoid mass within LEFT upper quadrant. Mass is new from PET-CT 01/02/2020. Differential includes massive mesenteric adenopathy versus lymphoma of the small bowel or descending colon. No oral or IV contrast. 2. New periaortic retroperitoneal lymphadenopathy. New precordial adenopathy. 3. Extensive fluid within the mesentery and omental thickening in LEFT lower quadrant. 4. Free fluid the pelvis with thin enhancing rim suggest peritonitis. 5. No evidence of bowel perforation. No evidence of bowel obstruction. Electronically Signed: By: Suzy Bouchard M.D. On: 04/19/2020 12:39   DG Chest 1 View  Result Date: 04/26/2020 CLINICAL DATA:  Elevated blood pressure. EXAM:  CHEST  1 VIEW COMPARISON:  April 29, 2020 FINDINGS: The lung volumes are low. There are small bilateral pleural effusions with adjacent atelectasis. The heart size is mildly enlarged. The lung volumes are low. There is no pneumothorax. IMPRESSION: 1. Low lung volumes. 2. Small bilateral pleural effusions with adjacent atelectasis. Electronically Signed   By: Constance Holster M.D.   On: 05/19/2020 20:42   DG Chest 2 View  Result Date: 04/28/2020 CLINICAL DATA:  Shortness of breath lasting several days. Groin swelling and redness. EXAM: CHEST - 2 VIEW COMPARISON:  Chest CT 04/19/20 FINDINGS: Stable cardiomediastinal contours. Low lung volumes. Unchanged small bilateral pleural effusions. No airspace densities. IMPRESSION: Low lung volumes with persistent small bilateral pleural effusions. Electronically Signed   By: Kerby Moors M.D.   On: 05/07/2020 05:09   DG Abd 1 View  Result Date: 05/08/2020 CLINICAL DATA:  Tube placement EXAM: ABDOMEN - 1 VIEW COMPARISON:  April 29, 2020 FINDINGS: The enteric tube projects over the gastric antrum/pylorus. The tube is pointed distally. The visualized bowel gas pattern is unremarkable. IMPRESSION: The enteric tube projects over the gastric antrum/pylorus. Electronically Signed   By: Constance Holster M.D.   On: 05/18/2020 22:05   CT CHEST WO CONTRAST  Result Date: 04/19/2020 CLINICAL DATA:  Pneumonia. Pleural effusion. Concern for metastases. EXAM: CT CHEST WITHOUT CONTRAST TECHNIQUE: Multidetector CT imaging of the chest was performed following the standard protocol without IV contrast. COMPARISON:  CT dated Nov 14, 2019 FINDINGS: Cardiovascular: The heart size is stable. Coronary artery calcifications are noted. There is no significant pericardial effusion. Mediastinum/Nodes: -- No mediastinal lymphadenopathy. -- No hilar lymphadenopathy. --there is left axillary adenopathy. This has progressed since the prior study. There is mild right axillary adenopathy,  progressed from prior study. -- No supraclavicular lymphadenopathy. -- Normal thyroid gland where visualized. -there is mild diffuse esophageal wall thickening. Lungs/Pleura: There are trace to small bilateral pleural effusions, right greater than left. There is atelectasis at the right lung base. There is no pneumothorax. No area of consolidation concerning for pneumonia. Upper Abdomen: There is a partially visualized mass in the left upper quadrant, better visualized on the patient's prior CT. There is free fluid in the upper abdomen. Multiple enlarged pericardiophrenic lymph nodes are noted. There appears to be retroperitoneal adenopathy. Musculoskeletal: No chest wall abnormality. No bony spinal canal stenosis. IMPRESSION: 1. Trace to small bilateral pleural effusions, right greater than left. There is atelectasis at the right lung base. No area of consolidation concerning for pneumonia. 2. Mild diffuse esophageal wall thickening. Correlation with patient's symptoms is recommended. 3. Worsening axillary adenopathy. 4. Partially visualized mass in the left upper quadrant, better visualized on the patient's prior CT. 5. Small volume ascites. Aortic Atherosclerosis (ICD10-I70.0). Electronically Signed   By: Constance Holster M.D.   On: 04/19/2020 20:37   CT Angio Chest PE  W and/or Wo Contrast  Result Date: 05/08/2020 CLINICAL DATA:  70 year old male with history of shortness of breath for the past few days. Abdominal distension. History of mantle cell lymphoma. EXAM: CT ANGIOGRAPHY CHEST CT ABDOMEN AND PELVIS WITH CONTRAST TECHNIQUE: Multidetector CT imaging of the chest was performed using the standard protocol during bolus administration of intravenous contrast. Multiplanar CT image reconstructions and MIPs were obtained to evaluate the vascular anatomy. Multidetector CT imaging of the abdomen and pelvis was performed using the standard protocol during bolus administration of intravenous contrast. CONTRAST:   174m OMNIPAQUE IOHEXOL 350 MG/ML SOLN COMPARISON:  CT the abdomen and pelvis 04/19/2020. CT the chest 04/19/2020. FINDINGS: CTA CHEST FINDINGS Cardiovascular: Study is limited by patient respiratory motion. However, despite this limitation there are segmental sized filling defects within pulmonary arteries to the left upper and lower lobes best appreciated on axial images 73 and 106 of series 5, and axial image 148 of series 5 respectively, compatible with pulmonary embolism. Heart size is normal. There is no significant pericardial fluid, thickening or pericardial calcification. Aortic atherosclerosis. No definite coronary artery calcifications. Mediastinum/Nodes: Multiple enlarged anterior mediastinal lymph nodes noted inferiorly, immediately above the right hemidiaphragm, measuring up to 2.3 cm in short axis (axial image 13 of series 11), increased compared to the prior study. Prominent but nonenlarged distal paraesophageal lymph node measuring 8 mm in short axis, nonspecific. Circumferential thickening of the distal third of the esophagus. Mildly enlarged left axillary lymph nodes measuring up to 1.3 cm in short axis. Lungs/Pleura: Collapse of the trachea and mainstem bronchi during expiration, indicative of tracheobronchomalacia. No acute consolidative airspace disease. Small bilateral pleural effusions lying dependently. No definite suspicious appearing pulmonary nodules or masses are noted. Musculoskeletal: There are no aggressive appearing lytic or blastic lesions noted in the visualized portions of the skeleton. Review of the MIP images confirms the above findings. CT ABDOMEN and PELVIS FINDINGS Hepatobiliary: Multiple well-defined low-attenuation lesions are again noted throughout the liver, similar in size, number and distribution to the prior study, largest of which are compatible with simple cysts measuring up to 5.3 x 3.7 cm in segment 2 and for a (axial image 18 of series 11). Smaller lesions are too  small to definitively characterize, but also favored to represent small cysts or biliary hamartomas. No new suspicious hepatic lesions. No intra or extrahepatic biliary ductal dilatation. 6 mm calcified gallstone lying dependently in the gallbladder. Gallbladder is not distended. Pancreas: No pancreatic mass. No pancreatic ductal dilatation. No pancreatic or peripancreatic fluid collections or inflammatory changes. Spleen: Unremarkable. Adrenals/Urinary Tract: 1.2 cm low-attenuation lesion in the upper pole of the right kidney and exophytic 2.4 cm low-attenuation lesion in the upper pole of the left kidney, compatible with simple cysts. Other subcentimeter low-attenuation lesions in both kidneys, too small to definitively characterize, but favored to represent tiny cysts. No hydroureteronephrosis. Urinary bladder is nearly decompressed, but otherwise unremarkable in appearance. Stomach/Bowel: Stomach is unremarkable in appearance. No pathologic dilatation of small bowel or colon. Multiple bowel loops in the left upper quadrant are intimately associated with the large soft tissue mass (discussed below). Colonic diverticulosis, particularly in the sigmoid colon, without definitive evidence to suggest an acute diverticulitis at this time. Vascular/Lymphatic: Aortic atherosclerosis, without evidence of aneurysm or dissection in the abdominal or pelvic vasculature. Extensive lymphadenopathy noted in the abdomen, most evident in the retroperitoneum where there are bulky para-aortic nodal masses measuring up to 2.3 x 4.1 cm near the left renal artery (axial image 41 of  series 11), 3.2 x 4.6 cm adjacent to the infrarenal abdominal aorta (axial image 48 of series 11), and 3.4 x 3.2 cm adjacent to the aortic bifurcation (axial image 54 of series 11), all of which appear similar to the recent prior study. Bulky celiac axis and gastrohepatic ligament lymph nodes are noted, measuring up to 1.9 cm in short axis (axial image 24 of  series 11). Several prominent but nonenlarged pelvic lymph nodes are noted (nonspecific). Reproductive: Prostate gland and seminal vesicles are unremarkable in appearance. Other: Bulky poorly defined soft tissue mass in the left upper quadrant of the abdomen which is intimately associated with multiple adjacent bowel loops, and difficult to distinctly defined, but estimated to measure approximately 15.5 x 13.5 x 13.4 cm (axial image 38 of series 11 and coronal image 53 of series 14). Moderate volume of ascites. Diffuse enhancement of the peritoneal membranes, indicative of widespread peritoneal metastatic disease. Extensive omental caking, best appreciated in the left side of the abdomen where the bulkiest portion of this measures approximately 14.9 x 4.0 cm (axial image 47 of series 11). No pneumoperitoneum. Musculoskeletal: There are no aggressive appearing lytic or blastic lesions noted in the visualized portions of the skeleton. Review of the MIP images confirms the above findings. IMPRESSION: 1. Study is positive for segmental sized pulmonary emboli in the left lung. These appear nonocclusive at this time, and there is no associated pulmonary hemorrhage to suggest frank pulmonary infarct. 2. Bulky soft tissue mass in the left upper quadrant with extensive lymphadenopathy in the chest and abdomen, and evidence of widespread intraperitoneal metastatic disease, as detailed above. 3. Small bilateral pleural effusions lying dependently. 4. Aortic atherosclerosis. 5. Tracheobronchomalacia. 6. Cholelithiasis. 7. Colonic diverticulosis. Electronically Signed   By: Vinnie Langton M.D.   On: 05/10/2020 12:50   CT Abdomen Pelvis W Contrast  Result Date: 05/20/2020 CLINICAL DATA:  69 year old male with history of shortness of breath for the past few days. Abdominal distension. History of mantle cell lymphoma. EXAM: CT ANGIOGRAPHY CHEST CT ABDOMEN AND PELVIS WITH CONTRAST TECHNIQUE: Multidetector CT imaging of the  chest was performed using the standard protocol during bolus administration of intravenous contrast. Multiplanar CT image reconstructions and MIPs were obtained to evaluate the vascular anatomy. Multidetector CT imaging of the abdomen and pelvis was performed using the standard protocol during bolus administration of intravenous contrast. CONTRAST:  131m OMNIPAQUE IOHEXOL 350 MG/ML SOLN COMPARISON:  CT the abdomen and pelvis 04/19/2020. CT the chest 04/19/2020. FINDINGS: CTA CHEST FINDINGS Cardiovascular: Study is limited by patient respiratory motion. However, despite this limitation there are segmental sized filling defects within pulmonary arteries to the left upper and lower lobes best appreciated on axial images 73 and 106 of series 5, and axial image 148 of series 5 respectively, compatible with pulmonary embolism. Heart size is normal. There is no significant pericardial fluid, thickening or pericardial calcification. Aortic atherosclerosis. No definite coronary artery calcifications. Mediastinum/Nodes: Multiple enlarged anterior mediastinal lymph nodes noted inferiorly, immediately above the right hemidiaphragm, measuring up to 2.3 cm in short axis (axial image 13 of series 11), increased compared to the prior study. Prominent but nonenlarged distal paraesophageal lymph node measuring 8 mm in short axis, nonspecific. Circumferential thickening of the distal third of the esophagus. Mildly enlarged left axillary lymph nodes measuring up to 1.3 cm in short axis. Lungs/Pleura: Collapse of the trachea and mainstem bronchi during expiration, indicative of tracheobronchomalacia. No acute consolidative airspace disease. Small bilateral pleural effusions lying dependently. No definite suspicious appearing  pulmonary nodules or masses are noted. Musculoskeletal: There are no aggressive appearing lytic or blastic lesions noted in the visualized portions of the skeleton. Review of the MIP images confirms the above  findings. CT ABDOMEN and PELVIS FINDINGS Hepatobiliary: Multiple well-defined low-attenuation lesions are again noted throughout the liver, similar in size, number and distribution to the prior study, largest of which are compatible with simple cysts measuring up to 5.3 x 3.7 cm in segment 2 and for a (axial image 18 of series 11). Smaller lesions are too small to definitively characterize, but also favored to represent small cysts or biliary hamartomas. No new suspicious hepatic lesions. No intra or extrahepatic biliary ductal dilatation. 6 mm calcified gallstone lying dependently in the gallbladder. Gallbladder is not distended. Pancreas: No pancreatic mass. No pancreatic ductal dilatation. No pancreatic or peripancreatic fluid collections or inflammatory changes. Spleen: Unremarkable. Adrenals/Urinary Tract: 1.2 cm low-attenuation lesion in the upper pole of the right kidney and exophytic 2.4 cm low-attenuation lesion in the upper pole of the left kidney, compatible with simple cysts. Other subcentimeter low-attenuation lesions in both kidneys, too small to definitively characterize, but favored to represent tiny cysts. No hydroureteronephrosis. Urinary bladder is nearly decompressed, but otherwise unremarkable in appearance. Stomach/Bowel: Stomach is unremarkable in appearance. No pathologic dilatation of small bowel or colon. Multiple bowel loops in the left upper quadrant are intimately associated with the large soft tissue mass (discussed below). Colonic diverticulosis, particularly in the sigmoid colon, without definitive evidence to suggest an acute diverticulitis at this time. Vascular/Lymphatic: Aortic atherosclerosis, without evidence of aneurysm or dissection in the abdominal or pelvic vasculature. Extensive lymphadenopathy noted in the abdomen, most evident in the retroperitoneum where there are bulky para-aortic nodal masses measuring up to 2.3 x 4.1 cm near the left renal artery (axial image 41 of  series 11), 3.2 x 4.6 cm adjacent to the infrarenal abdominal aorta (axial image 48 of series 11), and 3.4 x 3.2 cm adjacent to the aortic bifurcation (axial image 54 of series 11), all of which appear similar to the recent prior study. Bulky celiac axis and gastrohepatic ligament lymph nodes are noted, measuring up to 1.9 cm in short axis (axial image 24 of series 11). Several prominent but nonenlarged pelvic lymph nodes are noted (nonspecific). Reproductive: Prostate gland and seminal vesicles are unremarkable in appearance. Other: Bulky poorly defined soft tissue mass in the left upper quadrant of the abdomen which is intimately associated with multiple adjacent bowel loops, and difficult to distinctly defined, but estimated to measure approximately 15.5 x 13.5 x 13.4 cm (axial image 38 of series 11 and coronal image 53 of series 14). Moderate volume of ascites. Diffuse enhancement of the peritoneal membranes, indicative of widespread peritoneal metastatic disease. Extensive omental caking, best appreciated in the left side of the abdomen where the bulkiest portion of this measures approximately 14.9 x 4.0 cm (axial image 47 of series 11). No pneumoperitoneum. Musculoskeletal: There are no aggressive appearing lytic or blastic lesions noted in the visualized portions of the skeleton. Review of the MIP images confirms the above findings. IMPRESSION: 1. Study is positive for segmental sized pulmonary emboli in the left lung. These appear nonocclusive at this time, and there is no associated pulmonary hemorrhage to suggest frank pulmonary infarct. 2. Bulky soft tissue mass in the left upper quadrant with extensive lymphadenopathy in the chest and abdomen, and evidence of widespread intraperitoneal metastatic disease, as detailed above. 3. Small bilateral pleural effusions lying dependently. 4. Aortic atherosclerosis. 5. Tracheobronchomalacia.  6. Cholelithiasis. 7. Colonic diverticulosis. Electronically Signed   By:  Vinnie Langton M.D.   On: 04/23/2020 12:50   DG CHEST PORT 1 VIEW  Result Date: 04/30/2020 CLINICAL DATA:  Central line placement EXAM: PORTABLE CHEST 1 VIEW COMPARISON:  Radiograph 05/09/2020 FINDINGS: Endotracheal tube tip terminates 2.2 cm from the expected location of the carina. Transesophageal tube tip and side port distal to the GE junction. Right IJ catheter tip terminates in the right atrium. Telemetry leads overlie the chest. Layering bilateral effusions adjacent passive atelectasis. Additional bandlike subsegmental atelectasis in the lungs as well. Stable borderline cardiomegaly accounting for portable technique. No acute osseous or soft tissue abnormality. Degenerative changes are present in the imaged spine and shoulders. IMPRESSION: 1. Endotracheal tube tip terminates 2.2 cm from the expected location of the carina. Could consider retraction 2 cm to the mid trachea. 2. Transesophageal tube tip and side port distal to the GE junction. 3. Right IJ catheter tip terminates in the right atrium. 4. Layering bilateral effusions with adjacent passive atelectasis and vascular congestion. 5. Stable borderline cardiomegaly. Electronically Signed   By: Lovena Le M.D.   On: 04/30/2020 01:14   DG CHEST PORT 1 VIEW  Result Date: 05/11/2020 CLINICAL DATA:  Endotracheal tube placement EXAM: PORTABLE CHEST 1 VIEW COMPARISON:  April 29, 2020 FINDINGS: The endotracheal tube terminates above the carina. The lung volumes are low. Again identified are bilateral pleural effusions with adjacent atelectasis. The heart size is stable. There is no pneumothorax. The enteric tube terminates below the left hemidiaphragm. IMPRESSION: Lines and tubes as above.  Otherwise, stable exam. Electronically Signed   By: Constance Holster M.D.   On: 04/24/2020 22:04   DG Chest Portable 1 View  Result Date: 04/19/2020 CLINICAL DATA:  Cough, wheezing EXAM: PORTABLE CHEST 1 VIEW COMPARISON:  01/09/2020 FINDINGS: Low lung  volumes. Minimal atelectasis or scarring at the left lung base. No pleural effusion or pneumothorax. Heart size is within normal limits for technique. IMPRESSION: Minimal atelectasis or scarring at the left lung base. Electronically Signed   By: Macy Mis M.D.   On: 04/19/2020 12:18   DG Abd Portable 2 Views  Result Date: 05/15/2020 CLINICAL DATA:  Abdominal distension and pain. EXAM: PORTABLE ABDOMEN - 2 VIEW COMPARISON:  04/19/2020 FINDINGS: There is mild gaseous distension of the stomach. No dilated loops of large or small bowel. There is no evidence of free air. No radio-opaque calculi or other significant radiographic abnormality is seen. IMPRESSION: Nonobstructive bowel gas pattern. Electronically Signed   By: Kerby Moors M.D.   On: 04/26/2020 06:12   ECHOCARDIOGRAM COMPLETE  Result Date: 04/20/2020    ECHOCARDIOGRAM REPORT   Patient Name:   JAMERE STIDHAM Date of Exam: 04/20/2020 Medical Rec #:  [id number]    Height:       64.0 in Accession #:    [id number]   Weight:       210.1 lb Date of Birth:  1950/11/10   BSA:          1.998 m Patient Age:    69 years     BP:           156/83 mmHg Patient Gender: M            HR:           88 bpm. Exam Location:  Inpatient Procedure: 2D Echo, Cardiac Doppler and Color Doppler Indications:    Chemo evaluation  History:  Patient has prior history of Echocardiogram examinations, most                 recent 11/14/2019. COPD; Risk Factors:Hypertension and Diabetes.                 Mantle cell lymphoma.  Sonographer:    Dustin Flock Referring Phys: [place]  1. Left ventricular ejection fraction, by estimation, is 65 to 70%. The left ventricle has normal function. The left ventricle has no regional wall motion abnormalities. Left ventricular diastolic parameters are consistent with Grade I diastolic dysfunction (impaired relaxation).  2. Right ventricular systolic function is normal. The right ventricular size is normal.  Tricuspid regurgitation signal is inadequate for assessing PA pressure.  3. The mitral valve is grossly normal. Trivial mitral valve regurgitation. No evidence of mitral stenosis.  4. The aortic valve is tricuspid. There is mild calcification of the aortic valve. There is mild thickening of the aortic valve. Aortic valve regurgitation is not visualized. Mild aortic valve sclerosis is present, with no evidence of aortic valve stenosis.  5. The inferior vena cava is normal in size with greater than 50% respiratory variability, suggesting right atrial pressure of 3 mmHg. Comparison(s): Changes from prior study are noted. EF is now 65-70%. FINDINGS  Left Ventricle: Left ventricular ejection fraction, by estimation, is 65 to 70%. The left ventricle has normal function. The left ventricle has no regional wall motion abnormalities. The left ventricular internal cavity size was normal in size. There is  no left ventricular hypertrophy. Left ventricular diastolic parameters are consistent with Grade I diastolic dysfunction (impaired relaxation). Normal left ventricular filling pressure. Right Ventricle: The right ventricular size is normal. No increase in right ventricular wall thickness. Right ventricular systolic function is normal. Tricuspid regurgitation signal is inadequate for assessing PA pressure. Left Atrium: Left atrial size was normal in size. Right Atrium: Right atrial size was normal in size. Pericardium: Trivial pericardial effusion is present. Presence of pericardial fat pad. Mitral Valve: The mitral valve is grossly normal. Trivial mitral valve regurgitation. No evidence of mitral valve stenosis. Tricuspid Valve: The tricuspid valve is grossly normal. Tricuspid valve regurgitation is not demonstrated. No evidence of tricuspid stenosis. Aortic Valve: The aortic valve is tricuspid. There is mild calcification of the aortic valve. There is mild thickening of the aortic valve. Aortic valve regurgitation is not  visualized. Mild aortic valve sclerosis is present, with no evidence of aortic valve stenosis. Aortic valve mean gradient measures 11.1 mmHg. Aortic valve peak gradient measures 16.7 mmHg. Aortic valve area, by VTI measures 2.42 cm. Pulmonic Valve: The pulmonic valve was grossly normal. Pulmonic valve regurgitation is not visualized. No evidence of pulmonic stenosis. Aorta: The aortic root is normal in size and structure. Venous: The right upper pulmonary vein is normal. The inferior vena cava is normal in size with greater than 50% respiratory variability, suggesting right atrial pressure of 3 mmHg. IAS/Shunts: The atrial septum is grossly normal. Additional Comments: There is a small pleural effusion in the left lateral region.  LEFT VENTRICLE PLAX 2D LVIDd:         4.30 cm  Diastology LVIDs:         2.60 cm  LV e' medial:    8.81 cm/s LV PW:         1.10 cm  LV E/e' medial:  7.2 LV IVS:        1.10 cm  LV e' lateral:   7.51 cm/s LVOT  diam:     2.30 cm  LV E/e' lateral: 8.4 LV SV:         92 LV SV Index:   46 LVOT Area:     4.15 cm  RIGHT VENTRICLE RV Basal diam:  3.20 cm RV S prime:     11.00 cm/s TAPSE (M-mode): 2.9 cm LEFT ATRIUM             Index       RIGHT ATRIUM           Index LA diam:        3.30 cm 1.65 cm/m  RA Area:     15.30 cm LA Vol (A2C):   43.2 ml 21.62 ml/m RA Volume:   37.60 ml  18.82 ml/m LA Vol (A4C):   30.7 ml 15.37 ml/m LA Biplane Vol: 36.9 ml 18.47 ml/m  AORTIC VALVE AV Area (Vmax):    2.42 cm AV Area (Vmean):   2.14 cm AV Area (VTI):     2.42 cm AV Vmax:           204.20 cm/s AV Vmean:          159.222 cm/s AV VTI:            0.381 m AV Peak Grad:      16.7 mmHg AV Mean Grad:      11.1 mmHg LVOT Vmax:         119.00 cm/s LVOT Vmean:        82.200 cm/s LVOT VTI:          0.222 m LVOT/AV VTI ratio: 0.58  AORTA Ao Root diam: 3.10 cm MITRAL VALVE MV Area (PHT): 4.06 cm     SHUNTS MV Decel Time: 187 msec     Systemic VTI:  0.22 m MV E velocity: 63.40 cm/s   Systemic Diam: 2.30 cm MV  A velocity: 104.00 cm/s MV E/A ratio:  0.61 Eleonore Chiquito MD Electronically signed by Eleonore Chiquito MD Signature Date/Time: 04/20/2020/10:32:25 AM    Final    [place] ASCITES (ABDOMEN LIMITED)  Result Date: 04/19/2020 CLINICAL DATA:  69 year old male with abdominal distension, ascites EXAM: LIMITED ABDOMEN ULTRASOUND FOR ASCITES TECHNIQUE: Limited ultrasound survey for ascites was performed in all four abdominal quadrants. COMPARISON:  CT abdomen/pelvis 04/19/2020 FINDINGS: Sonographic interrogation of the abdomen was performed in anticipation of paracentesis. Unfortunately, the volume is a ascites is quite small and insufficient for drainage. There is however extensive omental caking and peritoneal nodularity. IMPRESSION: 1. Small volume ascites, insufficient for paracentesis. 2. Extensive omental caking and peritoneal nodularity consistent with peritoneal carcinomatosis. Electronically Signed   By: Jacqulynn Cadet M.D.   On: 04/19/2020 16:53   ECHOCARDIOGRAM LIMITED  Result Date: 04/30/2020    ECHOCARDIOGRAM LIMITED REPORT   Patient Name:   RUDI BUNYARD Date of Exam: 04/30/2020 Medical Rec #:  [id number]    Height:       64.0 in Accession #:    [id number]   Weight:       205.0 lb Date of Birth:  01/26/1951   BSA:          1.977 m Patient Age:    10 years     BP:           114/60 mmHg Patient Gender: M            HR:           109 bpm. Exam Location:  Inpatient Procedure: Limited Echo, Limited Color  Doppler and Cardiac Doppler Indications:    Pulmonary Embolus I26.99  History:        Patient has prior history of Echocardiogram examinations, most                 recent 04/20/2020. Mantle cell lymphoma; Risk                 Factors:Hypertension and Diabetes.  Sonographer:    Mikki Santee RDCS (AE) Referring Phys: [id number] Jonnie Finner  Sonographer Comments: Echo performed with patient supine and on artificial respirator. IMPRESSIONS  1. Cystic structure noted in liver on Subcostal images.  2. Left  ventricular ejection fraction, by estimation, is 60 to 65%. The left ventricle has normal function. The left ventricle has no regional wall motion abnormalities.  3. Right ventricular systolic function is normal. The right ventricular size is normal.  4. The pericardial effusion is posterior to the left ventricle.  5. The mitral valve is normal in structure. No evidence of mitral valve regurgitation. No evidence of mitral stenosis.  6. The aortic valve is normal in structure. Aortic valve regurgitation is not visualized. No aortic stenosis is present.  7. The inferior vena cava is normal in size with greater than 50% respiratory variability, suggesting right atrial pressure of 3 mmHg. FINDINGS  Left Ventricle: Left ventricular ejection fraction, by estimation, is 60 to 65%. The left ventricle has normal function. The left ventricle has no regional wall motion abnormalities. The left ventricular internal cavity size was normal in size. There is  no left ventricular hypertrophy. Right Ventricle: The right ventricular size is normal. No increase in right ventricular wall thickness. Right ventricular systolic function is normal. Left Atrium: Left atrial size was normal in size. Right Atrium: Right atrial size was normal in size. Pericardium: Trivial pericardial effusion is present. The pericardial effusion is posterior to the left ventricle. Mitral Valve: The mitral valve is normal in structure. No evidence of mitral valve stenosis. Tricuspid Valve: The tricuspid valve is normal in structure. Tricuspid valve regurgitation is mild . No evidence of tricuspid stenosis. Aortic Valve: The aortic valve is normal in structure. Aortic valve regurgitation is not visualized. No aortic stenosis is present. Pulmonic Valve: The pulmonic valve was normal in structure. Pulmonic valve regurgitation is trivial. No evidence of pulmonic stenosis. Aorta: The aortic root is normal in size and structure. Venous: The inferior vena cava is  normal in size with greater than 50% respiratory variability, suggesting right atrial pressure of 3 mmHg. IAS/Shunts: No atrial level shunt detected by color flow Doppler. Additional Comments: Cystic structure noted in liver on Subcostal images. LEFT VENTRICLE PLAX 2D LVIDd:         4.20 cm LVIDs:         2.60 cm LV PW:         1.00 cm LV IVS:        1.10 cm LVOT diam:     2.30 cm LVOT Area:     4.15 cm  RIGHT VENTRICLE RV S prime:     30.80 cm/s TAPSE (M-mode): 1.8 cm LEFT ATRIUM         Index LA diam:    3.00 cm 1.52 cm/m   AORTA Ao Root diam: 3.40 cm  SHUNTS Systemic Diam: 2.30 cm Jenkins Rouge MD Electronically signed by Jenkins Rouge MD Signature Date/Time: 04/30/2020/9:18:44 AM    Final    [place] EKG SITE RITE  Result Date: 04/19/2020 If Site Rite image not attached, placement  could not be confirmed due to current cardiac rhythm.   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 CD5+ clonal lymphoproliferative disorder" -04/26/2019 FISH revealed "Positive for p53 (17p13) deletion." -05/31/2019 PET/CT ([id number]) revealed "1. There are multiple prominent lymph nodes in the neck, both axilla, the retroperitoneum and pelvis with low level hypermetabolic activity consistent with chronic lymphocytic leukemia. Deauville 3 and 4. 2. Mild splenomegaly and mild generalized splenic hypermetabolic activity 3. No evidence of bone marrow involvement." -06/10/2019 Flow Pathology Report ([id number]) revealed "Abnormal B-cell population." -06/10/2019 Surgical Pathology Report ([id number])  revealed "Atypical lymphoid proliferation consistent with mantle cell lymphoma." -09/19/19 PET Skull Base to Thigh ([id number])-- favorable response to current treatment -01/02/2020 PET/CT ([id number]) revealed "1. Continued further decrease in size and FDG accumulation in  index cervical, axillary, retroperitoneal, and pelvic lymph nodes. No lymphadenopathy by size criteria today. FDG accumulation in these lymph nodes is compatible with Deauville 2 category. 2. Interval decrease in size with resolution of hypermetabolism associated with the spleen previously."  -04/19/2020 CT abdomen pelvis revealed "1. Dominant finding is new large (15 cm) lymphoid mass within LEFT upper quadrant. Mass is new from PET-CT 01/02/2020. Differential includes massive mesenteric adenopathy versus lymphoma of the small bowel or descending colon. No oral or IV contrast. 2. New periaortic retroperitoneal lymphadenopathy. New precordial Adenopathy. 3. Extensive fluid within the mesentery and omental thickening in LEFT lower quadrant. 4. Free fluid the pelvis with thin enhancing rim suggest peritonitis. 5. No evidence of bowel perforation. No evidence of bowel Obstruction." -05/12/2020 CTA chest and CT abdomen pelvis revealed "1. Study is positive for segmental sized pulmonary emboli in the left lung. These appear nonocclusive at this time, and there is no associated pulmonary hemorrhage to suggest frank pulmonary infarct. 2. Bulky soft tissue mass in the left upper quadrant with extensive lymphadenopathy in the chest and abdomen, and evidence of widespread intraperitoneal metastatic disease, as detailed above. 3. Small bilateral pleural effusions lying dependently. 4. Aortic atherosclerosis. 5. Tracheobronchomalacia. 6. Cholelithiasis. 7. Colonic diverticulosis."  2) Rigors from Rituxan - needing demerol as premedication. Rituxan rate to be capped at 146m/hour  3) Thrombocytopenia from chemotherapy  4) Neutropenia due to recent chemotherapy  5) anemia  6) significant new abdominal distention due to lymphoma recurrence  7) new hypercalcemia- ?  Related to dehydration and  Lymphoma -resolved at this time  8) acute hypoxic respiratory failure-multifactorial due to abdominal distention, PE,  and hypertensive emergency  9) left segmental PE  10) AKI  PLAN: -The patient has mantle cell lymphoma with 17 P mutation that is resistant to chemotherapy.  He received 1 cycle of CHOP with no improvement in his left upper quadrant soft tissue mass and abdominal distention. -I have reached out to radiation oncology for consideration of radiation to his left upper quadrant mass. -We have also reached out to our oral chemotherapy pharmacist to determine if we can start him on acalabrutinib in the near future. -The patient has received G-CSF already and recommend close monitoring of his CBC with differential.  Transfuse for hemoglobin less than 7 and platelets less than 20,000 or active bleeding. -Recommend antibiotics per primary team. -Continue heparin drip.  We can consider transition to Lovenox versus DOAC prior to hospital discharge.   LOS: 1 day   KMikey Bussing   ADDENDUM  .Patient was Personally and independently interviewed, examined and relevant elements  of the history of present illness were reviewed in details and an assessment and plan was created. All elements of the patient's history of present illness , assessment and plan were discussed in details with Mikey Bussing . The above documentation reflects our combined findings assessment and plan.  -Admitted with Acute respiratory failure - small PE + ? Volume overload (ECHO EF 60%)+ HTN urgency + Abdominal distension causing diaphragmatic pressure + r/o sepsis with fevers. Plan -Mx per critical care team appreciate help. -IV heparin for PE. -Diuresis for volume status -Antibiotics per critical care team. -Echo noted  -17 P mutated mantle cell lymphoma. - Pancytopenia due to cycle 1 CHOP chemotherapy.  Patient did receive G-CSF on 04/24/2020. Anticipate patient will nadir his counts between day 10 and day 12.  No significant response to mantle cell lymphoma at this time Plan -We will plan to start patient on a  acalabrutinib once stable and sepsis ruled out. -Has already received G-CSF showed no additional G-CSF indicated at this time. -Transfuse to maintain hemoglobin more than 8. -Transfuse platelets to maintain more than 30k in the setting of anticoagulation and sepsis.  -Acute renal insufficiency -?  Sepsis.  Renal hypoperfusion in setting of anemia. -Check uric acid level to evaluate for tumor lysis.  Hematology oncology will continue to follow  Sullivan Lone MD MS

## 2020-04-30 NOTE — Progress Notes (Signed)
ANTICOAGULATION CONSULT NOTE   Pharmacy Consult for heparin Indication: acute pulmonary embolus  Allergies  Allergen Reactions  . Ivp Dye [Iodinated Diagnostic Agents] Shortness Of Breath    "allergic to CT scan dye", took benadryl with last scan and tolerated well    Patient Measurements: Height: 5\' 4"  (162.6 cm) Weight: 93 kg (205 lb 0.4 oz) IBW/kg (Calculated) : 59.2 Heparin Dosing Weight: 81 kg  Vital Signs: Temp: 100.6 F (38.1 C) (11/08 1100) Temp Source: Bladder (11/08 1100) BP: 200/70 (11/08 1100) Pulse Rate: 117 (11/08 1100)  Labs: Recent Labs    04/26/2020 0503 05/01/2020 0503 05/06/2020 2146 05/02/2020 2146 05/06/2020 2315 04/30/20 0430 04/30/20 1029  HGB 11.5*   < > 10.2*   < > 9.6* 8.3*  --   HCT 35.5*   < > 30.0*  --  30.1* 25.9*  --   PLT 100*  --   --   --  82* 69*  --   HEPARINUNFRC  --   --   --   --  0.27*  --  0.65  CREATININE 1.11  --   --   --   --  1.76*  --   TROPONINIHS  --   --   --   --  52*  --   --    < > = values in this interval not displayed.    Estimated Creatinine Clearance: 40.2 mL/min (A) (by C-G formula based on SCr of 1.76 mg/dL (H)).   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  Chest CT showed acute PE.  Pharmacy is consulted to start heparin drip for VTE.  Today, 04/30/2020:  Heparin level therapeutic on current IV heparin rate of 1500 units/hr  Per RN, patient with dark fluid coming from abdomen but not bloody  Hgb 8.3 dropping, Plts 69 also dropping - monitor closely  Goal of Therapy:  Heparin level 0.3-0.7 units/ml Monitor platelets by anticoagulation protocol: Yes   Plan:   Continue IV heparin at current rate of 1500 units/hr  Will recheck heparin level in 8 hrs as confirmatory level on current heparin rate  Daily heparin level and CBC  Adrian Saran, PharmD, BCPS [phone] until  3pm 04/30/2020 11:22 AM

## 2020-04-30 NOTE — Progress Notes (Signed)
ANTICOAGULATION CONSULT NOTE   Pharmacy Consult for heparin Indication: acute pulmonary embolus  Allergies  Allergen Reactions  . Ivp Dye [Iodinated Diagnostic Agents] Shortness Of Breath    "allergic to CT scan dye", took benadryl with last scan and tolerated well    Patient Measurements: Height: 5\' 4"  (162.6 cm) Weight: 93 kg (205 lb 0.4 oz) IBW/kg (Calculated) : 59.2 Heparin Dosing Weight: 81 kg  Assessment: Patient is a 69 y.o M with stage IV mantle cell lymphoma on chemotherapy (got CHOP on 04/21/20), presented to the ED on 11/7 with c/o SOB, groin swelling and emesis.  Chest CT showed acute PE.  Pharmacy is consulted to start heparin drip for VTE.  Confirmatory heparin level increased to 0.74, supra-therapeutic on heparin at 1500 units/hr. OG tube to LIS with output appears bilious, No bleeding or complications reported per RN.  Goal of Therapy:  Heparin level 0.3-0.7 units/ml Monitor platelets by anticoagulation protocol: Yes   Plan:   Decrease to IV heparin 1300 units/hr  Recheck heparin level in 8 hrs after rate change  Daily heparin level and CBC   Gretta Arab PharmD, BCPS Clinical Pharmacist [facility] main pharmacy [phone] 04/30/2020 8:19 PM

## 2020-04-30 NOTE — Procedures (Signed)
Central Venous Catheter Insertion Procedure Note  Dean Dixon  [id number]  1950-11-30  Date:04/30/20  Time:12:37 AM   Provider Performing:Troi Bechtold Rodman Pickle   Procedure: Insertion of Non-tunneled Central Venous [id number]) with US guidance (17616)   Indication(s) Medication administration and Difficult access  Consent Unable to obtain consent due to inability to find a medical decision maker for patient.  All reasonable efforts were made.  Another independent medical provider, Ogan, MD , confirmed the benefits of this procedure outweigh the risks.  Anesthesia Topical only with 1% lidocaine   Timeout Verified patient identification, verified procedure, site/side was marked, verified correct patient position, special equipment/implants available, medications/allergies/relevant history reviewed, required imaging and test results available.  Sterile Technique Maximal sterile technique including full sterile barrier drape, hand hygiene, sterile gown, sterile gloves, mask, hair covering, sterile ultrasound probe cover (if used).  Procedure Description Area of catheter insertion was cleaned with chlorhexidine and draped in sterile fashion.  With real-time ultrasound guidance a central venous catheter was placed into the right internal jugular vein. Nonpulsatile blood flow and easy flushing noted in all ports.  The catheter was sutured in place and sterile dressing applied.  Complications/Tolerance None; patient tolerated the procedure well. Chest X-ray is ordered to verify placement for internal jugular or subclavian cannulation.   Chest x-ray is not ordered for femoral cannulation.  EBL Minimal  Specimen(s) None  Rodman Pickle, M.D. [facility] Pulmonary/Critical Care Medicine 04/30/2020 12:38 AM

## 2020-04-30 NOTE — Progress Notes (Signed)
Pharmacy Antibiotic Note  Dean Dixon is a 69 y.o. male admitted on 04/24/2020 with sepsis.  Pharmacy has been consulted for cefepime dosing.  Plan: Cefepime 2g IV q12 pre current renal function  Height: 5\' 4"  (162.6 cm) Weight: 93 kg (205 lb 0.4 oz) IBW/kg (Calculated) : 59.2  Temp (24hrs), Avg:101.3 F (38.5 C), Min:97.4 F (36.3 C), Max:102 F (38.9 C)  Recent Labs  Lab 05/01/2020 0503 05/20/2020 0606 05/18/2020 2315 04/30/20 0430 04/30/20 0532  WBC 4.3  --  2.5* 1.7*  --   CREATININE 1.11  --   --  1.76*  --   LATICACIDVEN 3.2* 2.3*  --   --  1.9    Estimated Creatinine Clearance: 40.2 mL/min (A) (by C-G formula based on SCr of 1.76 mg/dL (H)).    Allergies  Allergen Reactions  . Ivp Dye [Iodinated Diagnostic Agents] Shortness Of Breath    "allergic to CT scan dye", took benadryl with last scan and tolerated well     Thank you for allowing pharmacy to be a part of this patient's care.  Kara Mead 04/30/2020 10:23 AM

## 2020-04-30 NOTE — Progress Notes (Signed)
  Echocardiogram 2D Echocardiogram has been performed.  Jennette Dubin 04/30/2020, 8:50 AM

## 2020-04-30 NOTE — Progress Notes (Signed)
Initial Nutrition Assessment  DOCUMENTATION CODES:   Obesity unspecified  INTERVENTION:  - if patient unable to be extubated today, recommend Vital High Protein @ 30 ml/hr to advance by 10 ml every 4 hours to reach goal rate of 50 ml/hr with 45 ml Prosource TF BID. - at goal rate, this regimen will provide 1280 kcal, 127 grams protein, and 1003 ml free water.    NUTRITION DIAGNOSIS:   Inadequate oral intake related to inability to eat as evidenced by NPO status.  GOAL:   Provide needs based on ASPEN/SCCM guidelines  MONITOR:   Vent status, Labs, Weight trends, Skin  REASON FOR ASSESSMENT:   Ventilator  ASSESSMENT:   69 y.o. male with medical history of stage 4 mantle cell lymphoma on CHOP, HTN, and DM. He presented to the ED with dyspnea x2 weeks worsened in the 2 days prior and abdominal distention. Patient reported to ED staff that he began a new chemo regimen around the time that the dyspnea began. He reported that abdominal distention is a chronic issue related to his lymphoma.  Patient intubated with OGT to LIS; abdominal xray report states that tube is in the gastric antrum vs pylorus region. Output of 650 ml brown liquid in canister. RN at bedside and reports that 600 ml of this is from night shift/when OGT placed and that only 50 ml from this shift. At bedside, RN reported likely extubation today. Patient was discussed in rounds this AM and possible extubation discussed at that time as well.   No family/visitors present at bedside. Weight today is 205 lb and is the lowest weight since 12/19/19 when he weighed 195 lb. Noted deep pitting edema to BLE and deep pitting edema to perineal area also documented in the flow sheet. Unsure of a dry weight for patient.   Per notes: - risk for aspiration; consideration of TF if not extubated today - fever, leukopenia - hypertensive emergency with chest pain on admission - AKI - stage 4 mantle cell lymphoma with LUQ mass and  extensive metastatic disease  Patient is currently intubated on ventilator support MV: 10.7 L/min Temp (24hrs), Avg:101.2 F (38.4 C), Min:97.4 F (36.3 C), Max:102 F (38.9 C) Propofol: none BP: 138/70 and MAP: 94    Labs reviewed; CBGs: 219, 166, 185 mg/dl, K: 5.9 mmol/l, BUN: 57 mg/dl, creatinine: 1.76 mg/dl, Ca: 8.2 mg/dl, GFR: 41 ml/min.  Medications reviewed; 40 mg IV lasix x1 dose 11/8, sliding scale novolog, 40 mg IV protonix/day.  IVF; NS @ 20 ml/hr.     NUTRITION - FOCUSED PHYSICAL EXAM:    Most Recent Value  Orbital Region No depletion  Upper Arm Region No depletion  Thoracic and Lumbar Region No depletion  Buccal Region No depletion  Temple Region Severe depletion  Clavicle Bone Region No depletion  Clavicle and Acromion Bone Region No depletion  Scapular Bone Region No depletion  Dorsal Hand No depletion  Patellar Region No depletion  Anterior Thigh Region No depletion  Posterior Calf Region No depletion  Edema (RD Assessment) Moderate  [BLE]  Hair Reviewed  Eyes Unable to assess  Mouth Unable to assess  Skin Reviewed  Nails Reviewed       Diet Order:   Diet Order            Diet NPO time specified  Diet effective now                 EDUCATION NEEDS:   No education needs have been  identified at this time  Skin:  Skin Assessment: Skin Integrity Issues: Skin Integrity Issues:: Stage II Stage II: coccyx  Last BM:  PTA/unknown  Height:   Ht Readings from Last 1 Encounters:  05/20/2020 5\' 4"  (1.626 m)    Weight:   Wt Readings from Last 1 Encounters:  04/30/20 93 kg    Estimated Nutritional Needs:  Kcal:  1023-1302 kcal Protein:  >/= 118 grams Fluid:  >/= 1.2 L/day     Jarome Matin, MS, RD, LDN, CNSC Inpatient Clinical Dietitian RD pager # available in AMION  After hours/weekend pager # available in [facility]

## 2020-04-30 NOTE — Progress Notes (Signed)
NAME:  Dean Dixon, MRN:  [id number], DOB:  April 20, 1951, LOS: 1 ADMISSION DATE:  05/13/2020, CONSULTATION DATE:  11/7/201 REFERRING MD:  TRH, CHIEF COMPLAINT:  Respiratory distress  Brief History   69 year old male with stage IV mantle cell lymphoma on CHOP presenting with N/V and progressive SOB over the last 2 weeks found to have acute left segmental PE.  Admitted to [facility] however developed hypertension 250/100 with respiratory distress with chest pain, unable to tolerate BiPAP and was intubated.      History of present illness   HPI obtained from medical chart review as patient is currently intubated and sedated on mechanical ventilation.  69 year old male with prior medical history of stage IV mantle cell lymphoma (diagnosed 06/20/2019) on CHOP, hypertension, diabetes type 2 who presented to [facility] on 11/7 with progressive shortness of breath over the last 2 weeks, worse over the last 2 days with above baseline abdominal distention, non bloody nausea/ vomiting, nonradiating intermittent midsternal chest tightness, and cough with lightly productive greenish nonbloody sputum.  Additionally reported ongoing swelling in his legs and testicles causing difficulty with urination.  Recent hospitalization 10/28 - 11/1 found to have new left upper quadrant abdominal mass and hypercalcemia.    In ER, he was afebrile, hypertensive, mildly tachypneic, and tachycardic with oxygen saturations of 98% on room air.  Noted to have multiple episodes of vomiting and retching treated with zofran.  KUB with non-obstructive gas pattern.   Labs noted for glucose 290 with HgbA1c 6.3, BUN 40, AG 17, lactic acid 3.2-> 2.3, troponin hs 52, BNP 133, D-dimer 2.47, Hgb 11.5/ HCT 35.5, platelets 100, neg COVID/ flu, UA noted turbid urine with 5 ketones, negative for leukocytes/ nitrates.  CXR showed small bilateral pleural effusion with atelectasis and low lung volumes.  He was pretreated given his contrast allergy. CTA PE and  CT abd/ pelvis showed segmental nonocclusive left pulmonary embolism without associated hemorrhage or pulmonary infarct.  Additionally noted bulky soft tissue mass in LUQ with extensive lymphadenopathy in the chest and abd with evidence of widespread intraperitoneal metastatic disease, small bilateral pleural effusion, and  tracheobronchomalacia. Started on heparin gtt per pharmacy.   He was admitted by [facility] to progressive care.  In unit, he was noted to become hypertensive with BP 250/100 with complaints of chest discomfort with noted tachypnea and lethargy.  He was treated with hydralazine and placed on nitro gtt for blood pressure management.  Repeated EKG was non acute.  ABG on NRB noted 7.291/ 56.7/ 275.  Bipap was attempted however patient kept pulling it off and therefore given concern for impending respiratory failure, he was intubated by anesthesia.  PCCM consulted for further management.   Past Medical History  Mantle cell lymphoma on CHOP, DM, HTN  Significant Hospital Events   11/7 Admitted to [place] decompensated/ intubated  Consults:  PCCM 11/7  Procedures:  ETT 11/7 >>  Significant Diagnostic Tests:  11/7 KUB >> nonobstructive gas pattern 11/7 CTA PE/  CT abd/ pelvis >> Study is positive for segmental sized pulmonary emboli in the left lung. These appear nonocclusive at this time, and there is no associated pulmonary hemorrhage to suggest frank pulmonary infarct. Bulky soft tissue mass in the left upper quadrant with extensive lymphadenopathy in the chest and abdomen, and evidence of widespread intraperitoneal metastatic disease. Small bilateral pleural effusions lying dependently. Aortic atherosclerosis.  Tracheobronchomalacia. Cholelithiasis. Colonic diverticulosis. 11/8 [place] Ascites/ABD >> small volume ascites, insufficient for paracentesis, extensive omental caking and peritoneal nodularity  consistent with peritoneal carcinomatosis  11/8 ECHO >> cystic structure noted in liver on  subcostal images, LVEF 60-65%, no RWMA, RV systolic function normal, pericardial effusion is posterior to LV 11/8 LE Doppler >>   Micro Data:  SARS 2/ flu 11/7 >> neg MRSA 11/7 >> neg UA 11/7 >> negative, 5 ketones  Antimicrobials:  Previously on azithro/ ceftriaxone, stopped 10/30  Interim history/subjective:  Tmax 101.7 / WBC 1.7 (down from 12.3) On vent - 30%, PEEP 5. Tolerating PSV wean  Glucose range 166-272  I/O 138ml UOP, +733 since admit last night   Objective   Blood pressure 130/63, pulse (!) 108, temperature (!) 101.1 F (38.4 C), temperature source Bladder, resp. rate 17, height 5\' 4"  (1.626 m), weight 93 kg, SpO2 100 %.    Vent Mode: CPAP;PSV FiO2 (%):  [30 %-80 %] 30 % Set Rate:  [16 bmp] 16 bmp Vt Set:  [470 mL] 470 mL PEEP:  [5 cmH20] 5 cmH20 Pressure Support:  [5 cmH20] 5 cmH20 Plateau Pressure:  [17 cmH20-22 cmH20] 19 cmH20   Intake/Output Summary (Last 24 hours) at 04/30/2020 5809 Last data filed at 04/30/2020 0800 Gross per 24 hour  Intake 1639.74 ml  Output 850 ml  Net 789.74 ml   Filed Weights   05/20/2020 0357 05/19/2020 1826 04/30/20 0315  Weight: 97.5 kg 94.9 kg 93 kg   Physical Exam: General: elderly adult male lying in bed on vent in NAD  HEENT: MM pink/moist, ETT, anicteric  Neuro: opens eyes to voice, follows commands, nods yes / no, MAE CV: s1s2 rrr, no m/r/g PULM: non-labored on PSV, Vt ~500-650 with normal effort, strong cough GI: soft, bsx4 active  Extremities: warm/dry, 2+ BLE pitting edema up to knees  Skin: no rashes or lesions  Resolved Hospital Problem list    Assessment & Plan:  69 year old male with metastatic mantle cell lymphoma currently on chemotherapy who presented with N/V and shortness of breath x 2 weeks and found with acute PE. After admission, developed hypertensive emergency with respiratory distress requiring intubation.   Acute hypoxic respiratory failure  At risk for aspiration Multifactorial respiratory failure  in setting of distended abd, PE and hypertensive emergency. -PRVC 8cc/kg as rest mode  -PSV wean with goal for extubation  -Wean PEEP / FiO2 for sats >90% -follow intermittent CXR -hold sedation for SBT  -RASS goal 0 to -1  -lasix as below   Left segmental PE  Provoked in the setting of active malignancy  -continue heparin gtt per pharmacy  -ECHO as above  -await LE dopplers  -troponin flat   Fever, Leukopenia  R/o Infectious process - UA negative, no overt infiltrate on CT chest. May be malignancy related -empiric cefepime  -hold vanco as MRSA PCR negative   Hypertensive emergency with chest pain -continue home norvasc -tele monitoring   Dark non-bloody gastric output -NPO -consider TF if not extubated 11/8 -gastric tube to LIS   Mild DKA Hx DMT2 Beta-hydroxybutyric acid elevated at 1.05 -follow up BMP at 1400  -add SSI Q4  -moderate SSI coverage  AKI  AGMA / lactic acidosis Hyperkalemia  -Trend BMP / urinary output -follow up BMP at 1400 -Replace electrolytes as indicated -Avoid nephrotoxic agents, ensure adequate renal perfusion -lasix 40 mg IV x1 with volume up on exam, possible extubation and hyperkalemia   Pancytopenia  -continue heparin, monitor for bleeding   Stage IV mantle cell lymphoma with LUQ mass/ extensive metastatic disease Last CHOP 10/30, follows with Dr.  [place] consulted per [facility], appreciate input  -? Prognosis given new findings of LUQ mass and extensive metastatic disease    Best practice:  Diet: NPO Pain/Anxiety/Delirium protocol (if indicated): PAD protocol  VAP protocol (if indicated): Yes DVT prophylaxis: Systemic heparin GI prophylaxis: PPI Glucose control: CBG q4h Mobility: BR Code Status: Full code Family Communication: Called daughter - number incorrect in chart. Denman George Olegario Shearer) listed is Equities trader answering service number. Patient updated on plan of care 11/8 Disposition: ICU  Labs   CBC: Recent Labs  Lab  05/05/2020 0503 05/16/2020 2146 05/01/2020 2315 04/30/20 0430  WBC 4.3  --  2.5* 1.7*  NEUTROABS 0.7*  --   --   --   HGB 11.5* 10.2* 9.6* 8.3*  HCT 35.5* 30.0* 30.1* 25.9*  MCV 86.8  --  88.0 88.7  PLT 100*  --  82* 69*    Basic Metabolic Panel: Recent Labs  Lab 05/07/2020 0503 05/03/2020 2146 04/30/20 0430  NA 139 140 142  K 4.8 5.0 5.9*  CL 99  --  107  CO2 23  --  24  GLUCOSE 290*  --  236*  BUN 40*  --  57*  CREATININE 1.11  --  1.76*  CALCIUM 10.6*  --  8.2*   GFR: Estimated Creatinine Clearance: 40.2 mL/min (A) (by C-G formula based on SCr of 1.76 mg/dL (H)). Recent Labs  Lab 04/28/2020 0503 05/20/2020 0606 05/17/2020 2315 04/30/20 0430 04/30/20 0532  WBC 4.3  --  2.5* 1.7*  --   LATICACIDVEN 3.2* 2.3*  --   --  1.9    Liver Function Tests: Recent Labs  Lab 05/13/2020 0503 04/30/20 0430  AST 19 19  ALT 18 17  ALKPHOS 108 70  BILITOT 0.9 0.4  PROT 5.9* 5.0*  ALBUMIN 3.8 3.0*   No results for input(s): LIPASE, AMYLASE in the last 168 hours. No results for input(s): AMMONIA in the last 168 hours.  ABG    Component Value Date/Time   PHART 7.282 (L) 04/28/2020 2146   PCO2ART 58.2 (H) 04/28/2020 2146   PO2ART 278 (H) 05/19/2020 2146   HCO3 27.3 05/12/2020 2146   TCO2 29 05/01/2020 2146   O2SAT 100.0 04/24/2020 2146     Coagulation Profile: No results for input(s): INR, PROTIME in the last 168 hours.  Cardiac Enzymes: No results for input(s): CKTOTAL, CKMB, CKMBINDEX, TROPONINI in the last 168 hours.  HbA1C: Hgb A1c MFr Bld  Date/Time Value Ref Range Status  04/25/2020 05:49 PM 6.3 (H) 4.8 - 5.6 % Final    Comment:    (NOTE) Pre diabetes:          5.7%-6.4%  Diabetes:              >6.4%  Glycemic control for   <7.0% adults with diabetes   11/14/2019 06:13 AM 5.4 4.8 - 5.6 % Final    Comment:    (NOTE) Pre diabetes:          5.7%-6.4% Diabetes:              >6.4% Glycemic control for   <7.0% adults with diabetes     CBG: Recent Labs  Lab  04/23/20 1213 05/16/2020 2007 04/28/2020 2331 04/30/20 0409 04/30/20 0741  GLUCAP 145* 255* 272* 219* 166*     Critical care time: 35 minutes     Noe Gens, MSN, NP-C, AGACNP-BC [facility] 04/30/2020, 9:04 AM   Please see [email or web address] for pager details.

## 2020-04-30 NOTE — Progress Notes (Signed)
Updated Elink RN about drop in hemoglobin, brown emesis from OG >600 ml since OG placed, and unable to drop Propofol lower due to restlessness; Needing Fentanyl near every two hours. She will discuss with Elink MD.

## 2020-04-30 NOTE — Progress Notes (Signed)
[place] Progress Note Patient Name: Dean Dixon DOB: 1951/05/09 MRN: [id number]   Date of Service  04/30/2020  HPI/Events of Note  Fentanyl infusion ordered as a Propofol sparing measure, bedside RN instructed to wean Propofol as tolerated to a lower infusion rate .  eICU Interventions  See above.        Kerry Kass Averey Koning 04/30/2020, 5:34 AM

## 2020-04-30 NOTE — Progress Notes (Signed)
Called by RN with concerns for increased WOB, AMS. According to RN she believes pt may have aspirated. On assessment, pt is severely hypertensive, lethargic, tachycardia and tachypneic. LS reveal crackles and wheezing throughout. Interventions as follows.   Acute respiratory distress - Chest xray does not indicate pt has aspirated. ABG shows uncompensated respiratory acidosis.  -PCCM consulted for intubation and sedation  Hypertensive Crisis - Appears pt has been hypertensive with SBP in 190's- 200's since 1800 -Started on cardene and nitro gtt by PCCM - Given 40mg  hydralazine IV - Troponin stat - EKG stat  Attempted to reach family members regarding change in status and procedures done at bedside. Unable to reach to daughter or leave a message because mailbox is full.   Lovey Newcomer, NP [facility] 7p-7a [phone]  CRITICAL CARE Performed by: Neila Gear   Total critical care time: 30 minutes  Critical care time was exclusive of separately billable procedures and treating other patients.  Critical care was necessary to treat or prevent imminent or life-threatening deterioration.  Critical care was time spent personally by me on the following activities: development of treatment plan with patient and/or surrogate as well as nursing, discussions with consultants, evaluation of patient's response to treatment, examination of patient, obtaining history from patient or surrogate, ordering and performing treatments and interventions, ordering and review of laboratory studies, ordering and review of radiographic studies, pulse oximetry and re-evaluation of patient's condition.

## 2020-05-01 ENCOUNTER — Inpatient Hospital Stay (HOSPITAL_COMMUNITY): Payer: Medicare Other

## 2020-05-01 ENCOUNTER — Ambulatory Visit
Admit: 2020-05-01 | Discharge: 2020-05-01 | Disposition: A | Discharge status: Home / Self Care | Payer: Medicare Other | Attending: Radiation Oncology | Admitting: Radiation Oncology

## 2020-05-01 DIAGNOSIS — R609 Edema, unspecified: Secondary | ICD-10-CM

## 2020-05-01 DIAGNOSIS — D61810 Antineoplastic chemotherapy induced pancytopenia: Secondary | ICD-10-CM

## 2020-05-01 DIAGNOSIS — E883 Tumor lysis syndrome: Secondary | ICD-10-CM

## 2020-05-01 DIAGNOSIS — R52 Pain, unspecified: Secondary | ICD-10-CM

## 2020-05-01 DIAGNOSIS — M7989 Other specified soft tissue disorders: Secondary | ICD-10-CM

## 2020-05-01 DIAGNOSIS — T451X5A Adverse effect of antineoplastic and immunosuppressive drugs, initial encounter: Secondary | ICD-10-CM

## 2020-05-01 DIAGNOSIS — C8318 Mantle cell lymphoma, lymph nodes of multiple sites: Secondary | ICD-10-CM

## 2020-05-01 LAB — CBC
HCT: 22.4 % — ABNORMAL LOW (ref 39.0–52.0)
Hemoglobin: 7 g/dL — ABNORMAL LOW (ref 13.0–17.0)
MCH: 28.6 pg (ref 26.0–34.0)
MCHC: 31.3 g/dL (ref 30.0–36.0)
MCV: 91.4 fL (ref 80.0–100.0)
Platelets: 54 10*3/uL — ABNORMAL LOW (ref 150–400)
RBC: 2.45 MIL/uL — ABNORMAL LOW (ref 4.22–5.81)
RDW: 17.2 % — ABNORMAL HIGH (ref 11.5–15.5)
WBC: 1.9 10*3/uL — ABNORMAL LOW (ref 4.0–10.5)
nRBC: 3.1 % — ABNORMAL HIGH (ref 0.0–0.2)

## 2020-05-01 LAB — URIC ACID: Uric Acid, Serum: 15.1 mg/dL — ABNORMAL HIGH (ref 3.7–8.6)

## 2020-05-01 LAB — BASIC METABOLIC PANEL
Anion gap: 11 (ref 5–15)
BUN: 67 mg/dL — ABNORMAL HIGH (ref 8–23)
CO2: 26 mmol/L (ref 22–32)
Calcium: 7.9 mg/dL — ABNORMAL LOW (ref 8.9–10.3)
Chloride: 110 mmol/L (ref 98–111)
Creatinine, Ser: 1.9 mg/dL — ABNORMAL HIGH (ref 0.61–1.24)
GFR, Estimated: 37 mL/min — ABNORMAL LOW (ref >60)
Glucose, Bld: 143 mg/dL — ABNORMAL HIGH (ref 70–99)
Potassium: 4.9 mmol/L (ref 3.5–5.1)
Sodium: 147 mmol/L — ABNORMAL HIGH (ref 135–145)

## 2020-05-01 LAB — GLUCOSE, CAPILLARY
Glucose-Capillary: 139 mg/dL — ABNORMAL HIGH (ref 70–99)
Glucose-Capillary: 111 mg/dL — ABNORMAL HIGH (ref 70–99)
Glucose-Capillary: 145 mg/dL — ABNORMAL HIGH (ref 70–99)
Glucose-Capillary: 147 mg/dL — ABNORMAL HIGH (ref 70–99)
Glucose-Capillary: 153 mg/dL — ABNORMAL HIGH (ref 70–99)

## 2020-05-01 LAB — HEPARIN LEVEL (UNFRACTIONATED)
Heparin Unfractionated: 0.64 IU/mL (ref 0.30–0.70)
Heparin Unfractionated: 0.68 IU/mL (ref 0.30–0.70)

## 2020-05-01 LAB — PREPARE RBC (CROSSMATCH)

## 2020-05-01 MED ORDER — SODIUM CHLORIDE 0.9 % IV SOLN
6.0000 mg | Freq: Once | INTRAVENOUS | Status: AC
  Administered 2020-05-02: 6 mg via INTRAVENOUS
  Filled 2020-05-01: qty 4

## 2020-05-01 MED ORDER — FUROSEMIDE 10 MG/ML IJ SOLN
40.0000 mg | Freq: Once | INTRAMUSCULAR | Status: AC
  Administered 2020-05-01: 40 mg via INTRAVENOUS
  Filled 2020-05-01: qty 4

## 2020-05-01 MED ORDER — SODIUM CHLORIDE 0.9% IV SOLUTION: Freq: Once | INTRAVENOUS | Status: AC

## 2020-05-01 NOTE — Procedures (Signed)
Extubation Procedure Note  Patient Details:   Name: Dean Dixon DOB: 1951/04/12 MRN: [id number]   Airway Documentation:    Vent end date: 05/01/20 Vent end time: 0930   Evaluation  O2 sats: currently acceptable Complications: No apparent complications Patient did tolerate procedure well. Bilateral Breath Sounds: Coarse crackles, Diminished   Yes  Baldwin [place] Nannette 05/01/2020, 9:48 AM  Positive cuff leak pre extubation. Placed PT on 4 lpm nasal cannula post extubation.

## 2020-05-01 NOTE — Progress Notes (Signed)
Post extubation placed PT on 4 lpm nasal cannula. Increased to 6 lpm due to Sp02 <88%. PT has congestion in back of throat and is attempting to cough and clear. WOB is increasing and breathing through mouth- placed PT on NRB. RN aware and agrees to help titrate 02 devices.

## 2020-05-01 NOTE — Progress Notes (Signed)
Removed PT from BiPAP for RN to complete oral care- placed on NRB. Current Sp02 98%, RR 24, 153/71, HR 126. PT states it is harder to breathe with BiPAP off. RN agrees to contact RT when oral care complete so PT can be placed back on BiPAP.

## 2020-05-01 NOTE — Consult Note (Signed)
Radiation Oncology         [phone] ________________________________  Name: Dean Dixon        MRN: [id number]  Date of Service: 05/01/20 DOB: [birth year]  REFERRING PHYSICIAN: Dr. Irene Limbo  DIAGNOSIS: The primary encounter diagnosis was Lower abdominal pain. Diagnoses of Abdominal distension, Shortness of breath, Acute pulmonary embolism without acute cor pulmonale, unspecified pulmonary embolism type ([place]), Mantle cell lymphoma, unspecified body region [facility]), Dyspnea and respiratory abnormalities, Intubation of airway performed without difficulty, Encounter for orogastric (OG) tube placement, and Encounter for central line placement were also pertinent to this visit.   HISTORY OF PRESENT ILLNESS: Dean Dixon is a 69 y.o. male seen at the request of Dr. Irene Limbo for a history of Stage IV Mantle Cell Lymphoma with CLL originally diagnosed in December 2020 with disease in the neck, axillae, retroperitoneum, and pelvis. He has received several courses of chemotherapy between January and April 2021 which showed improvement in disease but recent imaging in October showed progressive disease including a large 15 cm mass in the LUQ, and new periaortic adenopathy and peritoneal based disease. He was started on CHOP and received 1 cycle of CHOP chemotherapy recently without improvement and is currently hospitalized with acute left segmental PE, respiratory failure complicated by hypertensive urgency who is currently ventilated. His CT scans on 04/23/2020 showed the pulmonary emboli in the left lung and bulky LUQ adenopathy about 15 cm in dimension with widespread peritoneal disease including 15 cm omental caking. He has small bilateral effusions that appear dependent, stigmata of atherosclerotic changes, diverticulosis, cholelithiasis, and tracheobronchomalacia were noted. We've been asked to consider palliative radiotherapy given his current condition.     PREVIOUS RADIATION THERAPY: No   PAST MEDICAL  HISTORY:  Past Medical History:  Diagnosis Date  . Cancer ([place])    Mantle Cell Lymphoma  . Diabetes mellitus without complication ([place])   . Hypertension        PAST SURGICAL HISTORY: Past Surgical History:  Procedure Laterality Date  . APPENDECTOMY    . TONSILLECTOMY       FAMILY HISTORY: No family history on file.   SOCIAL HISTORY:  reports that he has never smoked. He has never used smokeless tobacco. He reports current alcohol use of about 2.0 standard drinks of alcohol per week. He reports that he does not use drugs.   ALLERGIES: Ivp dye [iodinated diagnostic agents]   MEDICATIONS:  Current Facility-Administered Medications  Medication Dose Route Frequency Provider Last Rate Last Admin  . 0.9 %  sodium chloride infusion   Intravenous Continuous Ollis, Brandi L, NP 20 mL/hr at 05/01/20 0700 Rate Verify at 05/01/20 0700  . 0.9 %  sodium chloride infusion   Intravenous PRN Noe Gens L, NP 10 mL/hr at 05/01/20 0700 Rate Verify at 05/01/20 0700  . acetaminophen (TYLENOL) tablet 650 mg  650 mg Per Tube Q6H PRN Efraim Kaufmann, RPH   650 mg at 05/01/20 8127   Or  . acetaminophen (TYLENOL) suppository 650 mg  650 mg Rectal Q6H PRN Efraim Kaufmann, RPH      . allopurinol (ZYLOPRIM) tablet 300 mg  300 mg Per Tube Daily Ollis, Brandi L, NP   300 mg at 04/30/20 1037  . amLODipine (NORVASC) tablet 10 mg  10 mg Per Tube Daily Ollis, Brandi L, NP   10 mg at 04/30/20 1037  . ceFEPIme (MAXIPIME) 2 g in sodium chloride 0.9 % 100 mL IVPB  2 g Intravenous Q12H Adrian Saran, [facility]  Stopped at 05/01/20 0036  . chlorhexidine gluconate (MEDLINE KIT) (PERIDEX) 0.12 % solution 15 mL  15 mL Mouth Rinse BID Frederik Pear, MD   15 mL at 04/30/20 2000  . Chlorhexidine Gluconate Cloth 2 % PADS 6 each  6 each Topical Daily Kyle, Tyrone A, DO   6 each at 04/30/20 2000  . fentaNYL (SUBLIMAZE) injection 25-100 mcg  25-100 mcg Intravenous Q2H PRN Frederik Pear, MD   100 mcg at 04/30/20  0524  . fentaNYL 2534mg in NS 2537m(1078mml) infusion-PREMIX  0-400 mcg/hr Intravenous Continuous OgaFrederik PearD 10 mL/hr at 05/01/20 0700 100 mcg/hr at 05/01/20 0700  . guaiFENesin (ROBITUSSIN) 100 MG/5ML solution 200 mg  10 mL Per Tube Q4H PRN MicEfraim KaufmannPH      . heparin ADULT infusion 100 units/mL (25000 units/250m69mdium chloride 0.45%)  1,300 Units/hr Intravenous Continuous Shade, Christine E, RPH 13 mL/hr at 05/01/20 0700 1,300 Units/hr at 05/01/20 0700  . insulin aspart (novoLOG) injection 0-15 Units  0-15 Units Subcutaneous Q4H Ollis, Brandi L, NP   2 Units at 05/01/20 0500  . MEDLINE mouth rinse  15 mL Mouth Rinse 10 times per day OganFrederik Pear   15 mL at 05/01/20 0600  . nitroGLYCERIN 50 mg in dextrose 5 % 250 mL (0.2 mg/mL) infusion  0-200 mcg/min Intravenous Titrated OganFrederik Pear   Stopped at 05/06/2020 2102  . pantoprazole (PROTONIX) injection 40 mg  40 mg Intravenous Q24H ElliMargaretha Seeds   40 mg at 05/01/20 0045  . propofol (DIPRIVAN) 1000 MG/100ML infusion  5-80 mcg/kg/min Intravenous Titrated OganFrederik Pear   Paused at 04/30/20 0857  . sodium chloride flush (NS) 0.9 % injection 10-40 mL  10-40 mL Intracatheter Q12H Olalere, Adewale A, MD   30 mL at 04/30/20 2100  . sodium chloride flush (NS) 0.9 % injection 10-40 mL  10-40 mL Intracatheter PRN Olalere, Adewale A, MD         REVIEW OF SYSTEMS: On review of systems, the patient is unable to engage by phone given his ventilation status.    PHYSICAL EXAM:  Wt Readings from Last 3 Encounters:  04/30/20 205 lb 0.4 oz (93 kg)  04/21/20 211 lb 3.2 oz (95.8 kg)  04/19/20 210 lb 1.6 oz (95.3 kg)   Temp Readings from Last 3 Encounters:  05/01/20 (!) 101.5 F (38.6 C)  04/24/20 97.7 F (36.5 C) (Oral)  04/22/20 (!) 97.5 F (36.4 C) (Oral)   BP Readings from Last 3 Encounters:  05/01/20 121/65  04/24/20 137/66  04/22/20 (!) 155/84   Pulse Readings from Last 3 Encounters:    05/01/20 (!) 121  04/24/20 97  04/22/20 95   Unable to assess due to encounter type.  ECOG = 4  0 - Asymptomatic (Fully active, able to carry on all predisease activities without restriction)  1 - Symptomatic but completely ambulatory (Restricted in physically strenuous activity but ambulatory and able to carry out work of a light or sedentary nature. For example, light housework, office work)  2 - Symptomatic, <50% in bed during the day (Ambulatory and capable of all self care but unable to carry out any work activities. Up and about more than 50% of waking hours)  3 - Symptomatic, >50% in bed, but not bedbound (Capable of only limited self-care, confined to bed or chair 50% or more of waking hours)  4 - Bedbound (Completely disabled. Cannot carry on any self-care. Totally  confined to bed or chair)  5 - Death   Eustace Pen MM, Creech RH, Tormey DC, et al. [phone]). "Toxicity and response criteria of the [facility]". [place] Oncol. 5 (6): 649-55    LABORATORY DATA:  Lab Results  Component Value Date   WBC 1.9 (L) 05/01/2020   HGB 7.0 (L) 05/01/2020   HCT 22.4 (L) 05/01/2020   MCV 91.4 05/01/2020   PLT 54 (L) 05/01/2020   Lab Results  Component Value Date   NA 147 (H) 05/01/2020   K 4.9 05/01/2020   CL 110 05/01/2020   CO2 26 05/01/2020   Lab Results  Component Value Date   ALT 17 04/30/2020   AST 19 04/30/2020   ALKPHOS 70 04/30/2020   BILITOT 0.4 04/30/2020      RADIOGRAPHY: CT ABDOMEN PELVIS WO CONTRAST  Addendum Date: 04/19/2020   ADDENDUM REPORT: 04/19/2020 12:54 ADDENDUM: The peritoneal fluid within the pelvis is not enhancing as this is a non IV contrast imaging exam. Therefore would described fluid collection as having thickened rim. Favor peritoneal metastasis. Findings conveyed toSCOTT GOLDSTON on 04/19/2020  at12:54. Electronically Signed   By: Suzy Bouchard M.D.   On: 04/19/2020 12:54   Result Date: 04/19/2020 CLINICAL DATA:   Abdominal pain.  Tachycardia.  Mantle cell lymphoma EXAM: CT ABDOMEN AND PELVIS WITHOUT CONTRAST TECHNIQUE: Multidetector CT imaging of the abdomen and pelvis was performed following the standard protocol without IV contrast. COMPARISON:  PET-CT scan 01/02/2020 FINDINGS: Lower chest: New small RIGHT effusion. No pericardial effusion. There is new precordial adenopathy anterior to the RIGHT heart and liver (image 9/2). Hepatobiliary: New intraperitoneal free fluid along the margin of the RIGHT hepatic lobe. This fluid is simple attenuation. The liver appears unchanged on noncontrast exam. Large cyst the LEFT hepatic lobe. Gallbladder normal. Pancreas: Pancreas is grossly normal noncontrast exam Spleen: . spleen is unchanged Adrenals/urinary tract: Adrenal glands kidneys and ureters normal. Bladder Stomach/Bowel: Stomach duodenum normal. There is a new mass lesion in the LEFT upper quadrant measuring 14.2 by 13.0 cm. The mass may be associated the small bowel or the small bowel mesentery. The descending colon is also involving the mass (image 43/2. There is extensive fluid within the leaves of the mesentery of the distal small bowel. Appendix normal. Ascending transverse colon normal. The descending colon tracks along the new LEFT upper quadrant mass. Vascular/Lymphatic: New large LEFT upper quadrant mass either represents lymphoma involving the small bowel or mesentery. Mass is very large described the stomach section. Additionally there is new periportal adenopathy with enlarged lymph nodes along the aorta measuring up to 2 cm (image 44/2. There is nodularity along the greater omentum abdominal on the LEFT. Reproductive: Prostate unremarkable Other: Small volume free fluid the pelvis. There is enhancing rim to the fluid (image 73/2 which could indicate peritonitis. Musculoskeletal: No aggressive osseous lesion. IMPRESSION: 1. Dominant finding is new large (15 cm) lymphoid mass within LEFT upper quadrant. Mass is new  from PET-CT 01/02/2020. Differential includes massive mesenteric adenopathy versus lymphoma of the small bowel or descending colon. No oral or IV contrast. 2. New periaortic retroperitoneal lymphadenopathy. New precordial adenopathy. 3. Extensive fluid within the mesentery and omental thickening in LEFT lower quadrant. 4. Free fluid the pelvis with thin enhancing rim suggest peritonitis. 5. No evidence of bowel perforation. No evidence of bowel obstruction. Electronically Signed: By: Suzy Bouchard M.D. On: 04/19/2020 12:39   DG Chest 1 View  Result Date: 05/03/2020 CLINICAL DATA:  Elevated  blood pressure. EXAM: CHEST  1 VIEW COMPARISON:  April 29, 2020 FINDINGS: The lung volumes are low. There are small bilateral pleural effusions with adjacent atelectasis. The heart size is mildly enlarged. The lung volumes are low. There is no pneumothorax. IMPRESSION: 1. Low lung volumes. 2. Small bilateral pleural effusions with adjacent atelectasis. Electronically Signed   By: Constance Holster M.D.   On: 05/06/2020 20:42   DG Chest 2 View  Result Date: 04/30/2020 CLINICAL DATA:  Shortness of breath lasting several days. Groin swelling and redness. EXAM: CHEST - 2 VIEW COMPARISON:  Chest CT 04/19/20 FINDINGS: Stable cardiomediastinal contours. Low lung volumes. Unchanged small bilateral pleural effusions. No airspace densities. IMPRESSION: Low lung volumes with persistent small bilateral pleural effusions. Electronically Signed   By: Kerby Moors M.D.   On: 05/13/2020 05:09   DG Abd 1 View  Result Date: 05/08/2020 CLINICAL DATA:  Tube placement EXAM: ABDOMEN - 1 VIEW COMPARISON:  April 29, 2020 FINDINGS: The enteric tube projects over the gastric antrum/pylorus. The tube is pointed distally. The visualized bowel gas pattern is unremarkable. IMPRESSION: The enteric tube projects over the gastric antrum/pylorus. Electronically Signed   By: Constance Holster M.D.   On: 05/03/2020 22:05   CT CHEST WO  CONTRAST  Result Date: 04/19/2020 CLINICAL DATA:  Pneumonia. Pleural effusion. Concern for metastases. EXAM: CT CHEST WITHOUT CONTRAST TECHNIQUE: Multidetector CT imaging of the chest was performed following the standard protocol without IV contrast. COMPARISON:  CT dated Nov 14, 2019 FINDINGS: Cardiovascular: The heart size is stable. Coronary artery calcifications are noted. There is no significant pericardial effusion. Mediastinum/Nodes: -- No mediastinal lymphadenopathy. -- No hilar lymphadenopathy. --there is left axillary adenopathy. This has progressed since the prior study. There is mild right axillary adenopathy, progressed from prior study. -- No supraclavicular lymphadenopathy. -- Normal thyroid gland where visualized. -there is mild diffuse esophageal wall thickening. Lungs/Pleura: There are trace to small bilateral pleural effusions, right greater than left. There is atelectasis at the right lung base. There is no pneumothorax. No area of consolidation concerning for pneumonia. Upper Abdomen: There is a partially visualized mass in the left upper quadrant, better visualized on the patient's prior CT. There is free fluid in the upper abdomen. Multiple enlarged pericardiophrenic lymph nodes are noted. There appears to be retroperitoneal adenopathy. Musculoskeletal: No chest wall abnormality. No bony spinal canal stenosis. IMPRESSION: 1. Trace to small bilateral pleural effusions, right greater than left. There is atelectasis at the right lung base. No area of consolidation concerning for pneumonia. 2. Mild diffuse esophageal wall thickening. Correlation with patient's symptoms is recommended. 3. Worsening axillary adenopathy. 4. Partially visualized mass in the left upper quadrant, better visualized on the patient's prior CT. 5. Small volume ascites. Aortic Atherosclerosis (ICD10-I70.0). Electronically Signed   By: Constance Holster M.D.   On: 04/19/2020 20:37   CT Angio Chest PE W and/or Wo  Contrast  Result Date: 04/27/2020 CLINICAL DATA:  69 year old male with history of shortness of breath for the past few days. Abdominal distension. History of mantle cell lymphoma. EXAM: CT ANGIOGRAPHY CHEST CT ABDOMEN AND PELVIS WITH CONTRAST TECHNIQUE: Multidetector CT imaging of the chest was performed using the standard protocol during bolus administration of intravenous contrast. Multiplanar CT image reconstructions and MIPs were obtained to evaluate the vascular anatomy. Multidetector CT imaging of the abdomen and pelvis was performed using the standard protocol during bolus administration of intravenous contrast. CONTRAST:  129m OMNIPAQUE IOHEXOL 350 MG/ML SOLN COMPARISON:  CT the abdomen  and pelvis 04/19/2020. CT the chest 04/19/2020. FINDINGS: CTA CHEST FINDINGS Cardiovascular: Study is limited by patient respiratory motion. However, despite this limitation there are segmental sized filling defects within pulmonary arteries to the left upper and lower lobes best appreciated on axial images 73 and 106 of series 5, and axial image 148 of series 5 respectively, compatible with pulmonary embolism. Heart size is normal. There is no significant pericardial fluid, thickening or pericardial calcification. Aortic atherosclerosis. No definite coronary artery calcifications. Mediastinum/Nodes: Multiple enlarged anterior mediastinal lymph nodes noted inferiorly, immediately above the right hemidiaphragm, measuring up to 2.3 cm in short axis (axial image 13 of series 11), increased compared to the prior study. Prominent but nonenlarged distal paraesophageal lymph node measuring 8 mm in short axis, nonspecific. Circumferential thickening of the distal third of the esophagus. Mildly enlarged left axillary lymph nodes measuring up to 1.3 cm in short axis. Lungs/Pleura: Collapse of the trachea and mainstem bronchi during expiration, indicative of tracheobronchomalacia. No acute consolidative airspace disease. Small  bilateral pleural effusions lying dependently. No definite suspicious appearing pulmonary nodules or masses are noted. Musculoskeletal: There are no aggressive appearing lytic or blastic lesions noted in the visualized portions of the skeleton. Review of the MIP images confirms the above findings. CT ABDOMEN and PELVIS FINDINGS Hepatobiliary: Multiple well-defined low-attenuation lesions are again noted throughout the liver, similar in size, number and distribution to the prior study, largest of which are compatible with simple cysts measuring up to 5.3 x 3.7 cm in segment 2 and for a (axial image 18 of series 11). Smaller lesions are too small to definitively characterize, but also favored to represent small cysts or biliary hamartomas. No new suspicious hepatic lesions. No intra or extrahepatic biliary ductal dilatation. 6 mm calcified gallstone lying dependently in the gallbladder. Gallbladder is not distended. Pancreas: No pancreatic mass. No pancreatic ductal dilatation. No pancreatic or peripancreatic fluid collections or inflammatory changes. Spleen: Unremarkable. Adrenals/Urinary Tract: 1.2 cm low-attenuation lesion in the upper pole of the right kidney and exophytic 2.4 cm low-attenuation lesion in the upper pole of the left kidney, compatible with simple cysts. Other subcentimeter low-attenuation lesions in both kidneys, too small to definitively characterize, but favored to represent tiny cysts. No hydroureteronephrosis. Urinary bladder is nearly decompressed, but otherwise unremarkable in appearance. Stomach/Bowel: Stomach is unremarkable in appearance. No pathologic dilatation of small bowel or colon. Multiple bowel loops in the left upper quadrant are intimately associated with the large soft tissue mass (discussed below). Colonic diverticulosis, particularly in the sigmoid colon, without definitive evidence to suggest an acute diverticulitis at this time. Vascular/Lymphatic: Aortic atherosclerosis,  without evidence of aneurysm or dissection in the abdominal or pelvic vasculature. Extensive lymphadenopathy noted in the abdomen, most evident in the retroperitoneum where there are bulky para-aortic nodal masses measuring up to 2.3 x 4.1 cm near the left renal artery (axial image 41 of series 11), 3.2 x 4.6 cm adjacent to the infrarenal abdominal aorta (axial image 48 of series 11), and 3.4 x 3.2 cm adjacent to the aortic bifurcation (axial image 54 of series 11), all of which appear similar to the recent prior study. Bulky celiac axis and gastrohepatic ligament lymph nodes are noted, measuring up to 1.9 cm in short axis (axial image 24 of series 11). Several prominent but nonenlarged pelvic lymph nodes are noted (nonspecific). Reproductive: Prostate gland and seminal vesicles are unremarkable in appearance. Other: Bulky poorly defined soft tissue mass in the left upper quadrant of the abdomen which is intimately  associated with multiple adjacent bowel loops, and difficult to distinctly defined, but estimated to measure approximately 15.5 x 13.5 x 13.4 cm (axial image 38 of series 11 and coronal image 53 of series 14). Moderate volume of ascites. Diffuse enhancement of the peritoneal membranes, indicative of widespread peritoneal metastatic disease. Extensive omental caking, best appreciated in the left side of the abdomen where the bulkiest portion of this measures approximately 14.9 x 4.0 cm (axial image 47 of series 11). No pneumoperitoneum. Musculoskeletal: There are no aggressive appearing lytic or blastic lesions noted in the visualized portions of the skeleton. Review of the MIP images confirms the above findings. IMPRESSION: 1. Study is positive for segmental sized pulmonary emboli in the left lung. These appear nonocclusive at this time, and there is no associated pulmonary hemorrhage to suggest frank pulmonary infarct. 2. Bulky soft tissue mass in the left upper quadrant with extensive lymphadenopathy in  the chest and abdomen, and evidence of widespread intraperitoneal metastatic disease, as detailed above. 3. Small bilateral pleural effusions lying dependently. 4. Aortic atherosclerosis. 5. Tracheobronchomalacia. 6. Cholelithiasis. 7. Colonic diverticulosis. Electronically Signed   By: Vinnie Langton M.D.   On: 04/23/2020 12:50   CT Abdomen Pelvis W Contrast  Result Date: 05/10/2020 CLINICAL DATA:  69 year old male with history of shortness of breath for the past few days. Abdominal distension. History of mantle cell lymphoma. EXAM: CT ANGIOGRAPHY CHEST CT ABDOMEN AND PELVIS WITH CONTRAST TECHNIQUE: Multidetector CT imaging of the chest was performed using the standard protocol during bolus administration of intravenous contrast. Multiplanar CT image reconstructions and MIPs were obtained to evaluate the vascular anatomy. Multidetector CT imaging of the abdomen and pelvis was performed using the standard protocol during bolus administration of intravenous contrast. CONTRAST:  14m OMNIPAQUE IOHEXOL 350 MG/ML SOLN COMPARISON:  CT the abdomen and pelvis 04/19/2020. CT the chest 04/19/2020. FINDINGS: CTA CHEST FINDINGS Cardiovascular: Study is limited by patient respiratory motion. However, despite this limitation there are segmental sized filling defects within pulmonary arteries to the left upper and lower lobes best appreciated on axial images 73 and 106 of series 5, and axial image 148 of series 5 respectively, compatible with pulmonary embolism. Heart size is normal. There is no significant pericardial fluid, thickening or pericardial calcification. Aortic atherosclerosis. No definite coronary artery calcifications. Mediastinum/Nodes: Multiple enlarged anterior mediastinal lymph nodes noted inferiorly, immediately above the right hemidiaphragm, measuring up to 2.3 cm in short axis (axial image 13 of series 11), increased compared to the prior study. Prominent but nonenlarged distal paraesophageal lymph  node measuring 8 mm in short axis, nonspecific. Circumferential thickening of the distal third of the esophagus. Mildly enlarged left axillary lymph nodes measuring up to 1.3 cm in short axis. Lungs/Pleura: Collapse of the trachea and mainstem bronchi during expiration, indicative of tracheobronchomalacia. No acute consolidative airspace disease. Small bilateral pleural effusions lying dependently. No definite suspicious appearing pulmonary nodules or masses are noted. Musculoskeletal: There are no aggressive appearing lytic or blastic lesions noted in the visualized portions of the skeleton. Review of the MIP images confirms the above findings. CT ABDOMEN and PELVIS FINDINGS Hepatobiliary: Multiple well-defined low-attenuation lesions are again noted throughout the liver, similar in size, number and distribution to the prior study, largest of which are compatible with simple cysts measuring up to 5.3 x 3.7 cm in segment 2 and for a (axial image 18 of series 11). Smaller lesions are too small to definitively characterize, but also favored to represent small cysts or biliary hamartomas. No new  suspicious hepatic lesions. No intra or extrahepatic biliary ductal dilatation. 6 mm calcified gallstone lying dependently in the gallbladder. Gallbladder is not distended. Pancreas: No pancreatic mass. No pancreatic ductal dilatation. No pancreatic or peripancreatic fluid collections or inflammatory changes. Spleen: Unremarkable. Adrenals/Urinary Tract: 1.2 cm low-attenuation lesion in the upper pole of the right kidney and exophytic 2.4 cm low-attenuation lesion in the upper pole of the left kidney, compatible with simple cysts. Other subcentimeter low-attenuation lesions in both kidneys, too small to definitively characterize, but favored to represent tiny cysts. No hydroureteronephrosis. Urinary bladder is nearly decompressed, but otherwise unremarkable in appearance. Stomach/Bowel: Stomach is unremarkable in appearance. No  pathologic dilatation of small bowel or colon. Multiple bowel loops in the left upper quadrant are intimately associated with the large soft tissue mass (discussed below). Colonic diverticulosis, particularly in the sigmoid colon, without definitive evidence to suggest an acute diverticulitis at this time. Vascular/Lymphatic: Aortic atherosclerosis, without evidence of aneurysm or dissection in the abdominal or pelvic vasculature. Extensive lymphadenopathy noted in the abdomen, most evident in the retroperitoneum where there are bulky para-aortic nodal masses measuring up to 2.3 x 4.1 cm near the left renal artery (axial image 41 of series 11), 3.2 x 4.6 cm adjacent to the infrarenal abdominal aorta (axial image 48 of series 11), and 3.4 x 3.2 cm adjacent to the aortic bifurcation (axial image 54 of series 11), all of which appear similar to the recent prior study. Bulky celiac axis and gastrohepatic ligament lymph nodes are noted, measuring up to 1.9 cm in short axis (axial image 24 of series 11). Several prominent but nonenlarged pelvic lymph nodes are noted (nonspecific). Reproductive: Prostate gland and seminal vesicles are unremarkable in appearance. Other: Bulky poorly defined soft tissue mass in the left upper quadrant of the abdomen which is intimately associated with multiple adjacent bowel loops, and difficult to distinctly defined, but estimated to measure approximately 15.5 x 13.5 x 13.4 cm (axial image 38 of series 11 and coronal image 53 of series 14). Moderate volume of ascites. Diffuse enhancement of the peritoneal membranes, indicative of widespread peritoneal metastatic disease. Extensive omental caking, best appreciated in the left side of the abdomen where the bulkiest portion of this measures approximately 14.9 x 4.0 cm (axial image 47 of series 11). No pneumoperitoneum. Musculoskeletal: There are no aggressive appearing lytic or blastic lesions noted in the visualized portions of the skeleton.  Review of the MIP images confirms the above findings. IMPRESSION: 1. Study is positive for segmental sized pulmonary emboli in the left lung. These appear nonocclusive at this time, and there is no associated pulmonary hemorrhage to suggest frank pulmonary infarct. 2. Bulky soft tissue mass in the left upper quadrant with extensive lymphadenopathy in the chest and abdomen, and evidence of widespread intraperitoneal metastatic disease, as detailed above. 3. Small bilateral pleural effusions lying dependently. 4. Aortic atherosclerosis. 5. Tracheobronchomalacia. 6. Cholelithiasis. 7. Colonic diverticulosis. Electronically Signed   By: Vinnie Langton M.D.   On: 04/23/2020 12:50   DG CHEST PORT 1 VIEW  Result Date: 04/30/2020 CLINICAL DATA:  Central line placement EXAM: PORTABLE CHEST 1 VIEW COMPARISON:  Radiograph 04/28/2020 FINDINGS: Endotracheal tube tip terminates 2.2 cm from the expected location of the carina. Transesophageal tube tip and side port distal to the GE junction. Right IJ catheter tip terminates in the right atrium. Telemetry leads overlie the chest. Layering bilateral effusions adjacent passive atelectasis. Additional bandlike subsegmental atelectasis in the lungs as well. Stable borderline cardiomegaly accounting for portable technique.  No acute osseous or soft tissue abnormality. Degenerative changes are present in the imaged spine and shoulders. IMPRESSION: 1. Endotracheal tube tip terminates 2.2 cm from the expected location of the carina. Could consider retraction 2 cm to the mid trachea. 2. Transesophageal tube tip and side port distal to the GE junction. 3. Right IJ catheter tip terminates in the right atrium. 4. Layering bilateral effusions with adjacent passive atelectasis and vascular congestion. 5. Stable borderline cardiomegaly. Electronically Signed   By: Lovena Le M.D.   On: 04/30/2020 01:14   DG CHEST PORT 1 VIEW  Result Date: 05/14/2020 CLINICAL DATA:  Endotracheal tube  placement EXAM: PORTABLE CHEST 1 VIEW COMPARISON:  April 29, 2020 FINDINGS: The endotracheal tube terminates above the carina. The lung volumes are low. Again identified are bilateral pleural effusions with adjacent atelectasis. The heart size is stable. There is no pneumothorax. The enteric tube terminates below the left hemidiaphragm. IMPRESSION: Lines and tubes as above.  Otherwise, stable exam. Electronically Signed   By: Constance Holster M.D.   On: 04/30/2020 22:04   DG Chest Portable 1 View  Result Date: 04/19/2020 CLINICAL DATA:  Cough, wheezing EXAM: PORTABLE CHEST 1 VIEW COMPARISON:  01/09/2020 FINDINGS: Low lung volumes. Minimal atelectasis or scarring at the left lung base. No pleural effusion or pneumothorax. Heart size is within normal limits for technique. IMPRESSION: Minimal atelectasis or scarring at the left lung base. Electronically Signed   By: Macy Mis M.D.   On: 04/19/2020 12:18   DG Abd Portable 2 Views  Result Date: 04/24/2020 CLINICAL DATA:  Abdominal distension and pain. EXAM: PORTABLE ABDOMEN - 2 VIEW COMPARISON:  04/19/2020 FINDINGS: There is mild gaseous distension of the stomach. No dilated loops of large or small bowel. There is no evidence of free air. No radio-opaque calculi or other significant radiographic abnormality is seen. IMPRESSION: Nonobstructive bowel gas pattern. Electronically Signed   By: Kerby Moors M.D.   On: 05/06/2020 06:12   ECHOCARDIOGRAM COMPLETE  Result Date: 04/20/2020    ECHOCARDIOGRAM REPORT   Patient Name:   HARSHAAN WHANG Date of Exam: 04/20/2020 Medical Rec #:  [id number]    Height:       64.0 in Accession #:    [id number]   Weight:       210.1 lb Date of Birth:  1950/06/26   BSA:          1.998 m Patient Age:    42 years     BP:           156/83 mmHg Patient Gender: M            HR:           88 bpm. Exam Location:  Inpatient Procedure: 2D Echo, Cardiac Doppler and Color Doppler Indications:    Chemo evaluation  History:         Patient has prior history of Echocardiogram examinations, most                 recent 11/14/2019. COPD; Risk Factors:Hypertension and Diabetes.                 Mantle cell lymphoma.  Sonographer:    Dustin Flock Referring Phys: [place]  1. Left ventricular ejection fraction, by estimation, is 65 to 70%. The left ventricle has normal function. The left ventricle has no regional wall motion abnormalities. Left ventricular diastolic parameters are consistent with Grade I diastolic dysfunction (impaired relaxation).  2.  Right ventricular systolic function is normal. The right ventricular size is normal. Tricuspid regurgitation signal is inadequate for assessing PA pressure.  3. The mitral valve is grossly normal. Trivial mitral valve regurgitation. No evidence of mitral stenosis.  4. The aortic valve is tricuspid. There is mild calcification of the aortic valve. There is mild thickening of the aortic valve. Aortic valve regurgitation is not visualized. Mild aortic valve sclerosis is present, with no evidence of aortic valve stenosis.  5. The inferior vena cava is normal in size with greater than 50% respiratory variability, suggesting right atrial pressure of 3 mmHg. Comparison(s): Changes from prior study are noted. EF is now 65-70%. FINDINGS  Left Ventricle: Left ventricular ejection fraction, by estimation, is 65 to 70%. The left ventricle has normal function. The left ventricle has no regional wall motion abnormalities. The left ventricular internal cavity size was normal in size. There is  no left ventricular hypertrophy. Left ventricular diastolic parameters are consistent with Grade I diastolic dysfunction (impaired relaxation). Normal left ventricular filling pressure. Right Ventricle: The right ventricular size is normal. No increase in right ventricular wall thickness. Right ventricular systolic function is normal. Tricuspid regurgitation signal is inadequate for assessing PA  pressure. Left Atrium: Left atrial size was normal in size. Right Atrium: Right atrial size was normal in size. Pericardium: Trivial pericardial effusion is present. Presence of pericardial fat pad. Mitral Valve: The mitral valve is grossly normal. Trivial mitral valve regurgitation. No evidence of mitral valve stenosis. Tricuspid Valve: The tricuspid valve is grossly normal. Tricuspid valve regurgitation is not demonstrated. No evidence of tricuspid stenosis. Aortic Valve: The aortic valve is tricuspid. There is mild calcification of the aortic valve. There is mild thickening of the aortic valve. Aortic valve regurgitation is not visualized. Mild aortic valve sclerosis is present, with no evidence of aortic valve stenosis. Aortic valve mean gradient measures 11.1 mmHg. Aortic valve peak gradient measures 16.7 mmHg. Aortic valve area, by VTI measures 2.42 cm. Pulmonic Valve: The pulmonic valve was grossly normal. Pulmonic valve regurgitation is not visualized. No evidence of pulmonic stenosis. Aorta: The aortic root is normal in size and structure. Venous: The right upper pulmonary vein is normal. The inferior vena cava is normal in size with greater than 50% respiratory variability, suggesting right atrial pressure of 3 mmHg. IAS/Shunts: The atrial septum is grossly normal. Additional Comments: There is a small pleural effusion in the left lateral region.  LEFT VENTRICLE PLAX 2D LVIDd:         4.30 cm  Diastology LVIDs:         2.60 cm  LV e' medial:    8.81 cm/s LV PW:         1.10 cm  LV E/e' medial:  7.2 LV IVS:        1.10 cm  LV e' lateral:   7.51 cm/s LVOT diam:     2.30 cm  LV E/e' lateral: 8.4 LV SV:         92 LV SV Index:   46 LVOT Area:     4.15 cm  RIGHT VENTRICLE RV Basal diam:  3.20 cm RV S prime:     11.00 cm/s TAPSE (M-mode): 2.9 cm LEFT ATRIUM             Index       RIGHT ATRIUM           Index LA diam:        3.30 cm 1.65 cm/m  RA Area:     15.30 cm LA Vol (A2C):   43.2 ml 21.62 ml/m RA  Volume:   37.60 ml  18.82 ml/m LA Vol (A4C):   30.7 ml 15.37 ml/m LA Biplane Vol: 36.9 ml 18.47 ml/m  AORTIC VALVE AV Area (Vmax):    2.42 cm AV Area (Vmean):   2.14 cm AV Area (VTI):     2.42 cm AV Vmax:           204.20 cm/s AV Vmean:          159.222 cm/s AV VTI:            0.381 m AV Peak Grad:      16.7 mmHg AV Mean Grad:      11.1 mmHg LVOT Vmax:         119.00 cm/s LVOT Vmean:        82.200 cm/s LVOT VTI:          0.222 m LVOT/AV VTI ratio: 0.58  AORTA Ao Root diam: 3.10 cm MITRAL VALVE MV Area (PHT): 4.06 cm     SHUNTS MV Decel Time: 187 msec     Systemic VTI:  0.22 m MV E velocity: 63.40 cm/s   Systemic Diam: 2.30 cm MV A velocity: 104.00 cm/s MV E/A ratio:  0.61 Eleonore Chiquito MD Electronically signed by Eleonore Chiquito MD Signature Date/Time: 04/20/2020/10:32:25 AM    Final    [place] ASCITES (ABDOMEN LIMITED)  Result Date: 04/19/2020 CLINICAL DATA:  69 year old male with abdominal distension, ascites EXAM: LIMITED ABDOMEN ULTRASOUND FOR ASCITES TECHNIQUE: Limited ultrasound survey for ascites was performed in all four abdominal quadrants. COMPARISON:  CT abdomen/pelvis 04/19/2020 FINDINGS: Sonographic interrogation of the abdomen was performed in anticipation of paracentesis. Unfortunately, the volume is a ascites is quite small and insufficient for drainage. There is however extensive omental caking and peritoneal nodularity. IMPRESSION: 1. Small volume ascites, insufficient for paracentesis. 2. Extensive omental caking and peritoneal nodularity consistent with peritoneal carcinomatosis. Electronically Signed   By: Jacqulynn Cadet M.D.   On: 04/19/2020 16:53   ECHOCARDIOGRAM LIMITED  Result Date: 04/30/2020    ECHOCARDIOGRAM LIMITED REPORT   Patient Name:   DEREK LAUGHTER Date of Exam: 04/30/2020 Medical Rec #:  [id number]    Height:       64.0 in Accession #:    [id number]   Weight:       205.0 lb Date of Birth:  09-Aug-1950   BSA:          1.977 m Patient Age:    52 years     BP:            114/60 mmHg Patient Gender: M            HR:           109 bpm. Exam Location:  Inpatient Procedure: Limited Echo, Limited Color Doppler and Cardiac Doppler Indications:    Pulmonary Embolus I26.99  History:        Patient has prior history of Echocardiogram examinations, most                 recent 04/20/2020. Mantle cell lymphoma; Risk                 Factors:Hypertension and Diabetes.  Sonographer:    Mikki Santee RDCS (AE) Referring Phys: [id number] Jonnie Finner  Sonographer Comments: Echo performed with patient supine and on artificial respirator. IMPRESSIONS  1. Cystic structure noted in liver on Subcostal  images.  2. Left ventricular ejection fraction, by estimation, is 60 to 65%. The left ventricle has normal function. The left ventricle has no regional wall motion abnormalities.  3. Right ventricular systolic function is normal. The right ventricular size is normal.  4. The pericardial effusion is posterior to the left ventricle.  5. The mitral valve is normal in structure. No evidence of mitral valve regurgitation. No evidence of mitral stenosis.  6. The aortic valve is normal in structure. Aortic valve regurgitation is not visualized. No aortic stenosis is present.  7. The inferior vena cava is normal in size with greater than 50% respiratory variability, suggesting right atrial pressure of 3 mmHg. FINDINGS  Left Ventricle: Left ventricular ejection fraction, by estimation, is 60 to 65%. The left ventricle has normal function. The left ventricle has no regional wall motion abnormalities. The left ventricular internal cavity size was normal in size. There is  no left ventricular hypertrophy. Right Ventricle: The right ventricular size is normal. No increase in right ventricular wall thickness. Right ventricular systolic function is normal. Left Atrium: Left atrial size was normal in size. Right Atrium: Right atrial size was normal in size. Pericardium: Trivial pericardial effusion is present. The  pericardial effusion is posterior to the left ventricle. Mitral Valve: The mitral valve is normal in structure. No evidence of mitral valve stenosis. Tricuspid Valve: The tricuspid valve is normal in structure. Tricuspid valve regurgitation is mild . No evidence of tricuspid stenosis. Aortic Valve: The aortic valve is normal in structure. Aortic valve regurgitation is not visualized. No aortic stenosis is present. Pulmonic Valve: The pulmonic valve was normal in structure. Pulmonic valve regurgitation is trivial. No evidence of pulmonic stenosis. Aorta: The aortic root is normal in size and structure. Venous: The inferior vena cava is normal in size with greater than 50% respiratory variability, suggesting right atrial pressure of 3 mmHg. IAS/Shunts: No atrial level shunt detected by color flow Doppler. Additional Comments: Cystic structure noted in liver on Subcostal images. LEFT VENTRICLE PLAX 2D LVIDd:         4.20 cm LVIDs:         2.60 cm LV PW:         1.00 cm LV IVS:        1.10 cm LVOT diam:     2.30 cm LVOT Area:     4.15 cm  RIGHT VENTRICLE RV S prime:     30.80 cm/s TAPSE (M-mode): 1.8 cm LEFT ATRIUM         Index LA diam:    3.00 cm 1.52 cm/m   AORTA Ao Root diam: 3.40 cm  SHUNTS Systemic Diam: 2.30 cm Jenkins Rouge MD Electronically signed by Jenkins Rouge MD Signature Date/Time: 04/30/2020/9:18:44 AM    Final    [place] EKG SITE RITE  Result Date: 04/19/2020 If Site Rite image not attached, placement could not be confirmed due to current cardiac rhythm.      IMPRESSION/PLAN: 1. Stage IV Mantle Cell Lymphoma with massive abdominal adenopathy nonresponsive to CHOP. Dr. Lisbeth Renshaw has reviewed the patient's course and imaging to date. Dr. Lisbeth Renshaw reviewed with me the options of palliative radiotherapy if the concern from medical oncology is that he developed respiratory distress, PE, and hypertensive urgency due to extrinsic compression of his tumor on his vessels and that radiation would allow him to be  extubated. However if the thought is that these factors are independent, it may be beneficial to follow his course and see if he  is able to be extubated first prior to considering therapy given his overall poor prognosis  In a visit lasting 60 minutes, greater than 50% of the time was spent just in floor time (but not with the patient given his intubation status and difficulty establishing who his surrogate decision maker is) discussing the patient's condition, in preparation for the discussion, and coordinating the patient's care.     Carola Rhine, PAC   Addendum: The patient was extubated and on Bipap. We will follow up again with nursing as they're trying to establish who his HCPOA is.     Carola Rhine, PAC

## 2020-05-01 NOTE — Progress Notes (Signed)
ANTICOAGULATION CONSULT NOTE   Pharmacy Consult for heparin Indication: acute pulmonary embolus  Allergies  Allergen Reactions  . Ivp Dye [Iodinated Diagnostic Agents] Shortness Of Breath    "allergic to CT scan dye", took benadryl with last scan and tolerated well    Patient Measurements: Height: 5\' 4"  (162.6 cm) Weight: 93 kg (205 lb 0.4 oz) IBW/kg (Calculated) : 59.2 Heparin Dosing Weight: 81 kg  Assessment: Patient is a 69 y.o M with stage IV mantle cell lymphoma on chemotherapy (got CHOP on 04/21/20), presented to the ED on 11/7 with c/o SOB, groin swelling and emesis.  Chest CT showed acute PE.  Pharmacy is consulted to start heparin drip for VTE.  05/01/20  Heparin level = 0.64 (therapeutic) with heparin gtt @ 1300 units/hr  H/H low = 7/22.4 and PLTC = 54  SCr = 1.9  No complications of therapy noted  Goal of Therapy:  Heparin level 0.3-0.7 units/ml Monitor platelets by anticoagulation protocol: Yes   Plan:   Continue IV heparin @ 1300 units/hr  Recheck heparin level in 8 hrs to confirm therapeutic dose  Daily heparin level and CBC   Leone Haven, PharmD 05/01/2020 5:53 AM

## 2020-05-01 NOTE — Progress Notes (Signed)
eLink Physician-Brief Progress Note Patient Name: Dean Dixon DOB: April 29, 1951 MRN: [id number]   Date of Service  05/01/2020  HPI/Events of Note  Notified of temp 102. Already on cefepime. MRSA PCR negative  eICU Interventions  Ordered blood cultures and cooling blanket     Intervention Category Intermediate Interventions: Other:  Judd Lien 05/01/2020, 4:47 AM

## 2020-05-01 NOTE — Progress Notes (Signed)
eLink Physician-Brief Progress Note Patient Name: Dean Dixon DOB: 07/15/1950 MRN: [id number]   Date of Service  05/01/2020  HPI/Events of Note  Notified of Hgb 7, no active bleed On review patient is pancytopenic  eICU Interventions  Likely from sepsis Will hold off transfusion for now     Intervention Category Intermediate Interventions: Other:  Judd Lien 05/01/2020, 5:50 AM

## 2020-05-01 NOTE — Evaluation (Signed)
SLP Cancellation Note  Patient Details Name: [name] MRN: [id number] DOB: June 26, 1950   Cancelled treatment:       Reason Eval/Treat Not Completed: Other (comment) (note pt with difficulties managing secretions and now requiring NRB)  Kathleen Lime, MS [place] Office [phone] Pager [phone]   Macario Golds 05/01/2020, 2:43 PM

## 2020-05-01 NOTE — Progress Notes (Addendum)
HEMATOLOGY-ONCOLOGY PROGRESS NOTE  SUBJECTIVE:  Extubated earlier today.  Currently on BiPAP.  He has no specific complaints today.  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 ([place])   06/29/2019 - 10/20/2019 Chemotherapy   The patient had dexamethasone (DECADRON) 4 MG tablet, 8 mg, Oral, Daily, 1 of 1 cycle, Start date: 06/20/2019, End date: 11/17/2019 palonosetron (ALOXI) injection 0.25 mg, 0.25 mg, Intravenous,  Once, 5 of 5 cycles Administration: 0.25 mg (07/27/2019), 0.25 mg (06/30/2019), 0.25 mg (08/24/2019), 0.25 mg (10/20/2019), 0.25 mg (09/21/2019) pegfilgrastim (NEULASTA ONPRO KIT) injection 6 mg, 6 mg, Subcutaneous, Once, 3 of 3 cycles Administration: 6 mg (08/25/2019), 6 mg (09/22/2019) bendamustine (BENDEKA) 200 mg in sodium chloride 0.9 % 50 mL (3.4483 mg/mL) chemo infusion, 90 mg/m2 = 200 mg, Intravenous,  Once, 5 of 5 cycles Administration: 200 mg (06/29/2019), 200 mg (06/30/2019), 200 mg (07/27/2019), 200 mg (07/28/2019), 200 mg (08/24/2019), 200 mg (08/25/2019), 200 mg (09/21/2019), 200 mg (09/22/2019) riTUXimab-pvvr (RUXIENCE) 800 mg in sodium chloride 0.9 % 250 mL (2.4242 mg/mL) infusion, 375 mg/m2 = 800 mg, Intravenous,  Once, 5 of 5 cycles Dose modification: 375 mg/m2 (original dose 375 mg/m2, Cycle 2), 400 mg (original dose 375 mg/m2, Cycle 2, Reason: Other (see comments), Comment: finishing rituximab from previous day, infusion not completed), 300 mg (original dose 300 mg, Cycle 4) Administration: 800 mg (06/29/2019), 800 mg (07/27/2019), 800 mg (08/24/2019), 400 mg (07/28/2019), 800 mg (10/20/2019), 800 mg (09/21/2019), 300 mg (09/22/2019)  for chemotherapy treatment.    04/20/2020 -  Chemotherapy   The patient had DOXOrubicin (ADRIAMYCIN) chemo injection 100 mg, 48 mg/m2 = 104 mg, Intravenous,  Once, 1 of 8 cycles Administration: 100 mg (04/21/2020) palonosetron (ALOXI) injection 0.25 mg, 0.25 mg, Intravenous,  Once, 1  of 8 cycles Administration: 0.25 mg (04/21/2020) pegfilgrastim-cbqv (UDENYCA) injection 6 mg, 6 mg, Subcutaneous, Once, 1 of 1 cycle Administration: 6 mg (04/24/2020) vinCRIStine (ONCOVIN) 2 mg in sodium chloride 0.9 % 50 mL chemo infusion, 2 mg, Intravenous,  Once, 1 of 8 cycles Administration: 2 mg (04/21/2020) cyclophosphamide (CYTOXAN) 1,560 mg in sodium chloride 0.9 % 250 mL chemo infusion, 750 mg/m2 = 1,560 mg, Intravenous,  Once, 1 of 8 cycles Administration: 1,560 mg (04/21/2020) fosaprepitant (EMEND) 150 mg in sodium chloride 0.9 % 145 mL IVPB, 150 mg, Intravenous,  Once, 1 of 8 cycles Administration: 150 mg (04/21/2020)  for chemotherapy treatment.       REVIEW OF SYSTEMS:   Unable to obtain a comprehensive review of systems secondary to patient condition  PHYSICAL EXAMINATION: ECOG PERFORMANCE STATUS: 2 - Symptomatic, <50% confined to bed  Vitals:   05/01/20 1210 05/01/20 1502  BP:    Pulse:    Resp:    Temp:  (!) 100.6 F (38.1 C)  SpO2: 94%    Filed Weights   05/02/2020 0357 05/16/2020 1826 04/30/20 0315  Weight: 97.5 kg 94.9 kg 93 kg    Intake/Output from previous day: 11/08 0701 - 11/09 0700 In: 1436.1 [I.V.:1265.2; IV Piggyback:170.9] Out: 2500 [Urine:2200; Emesis/NG output:300]  GENERAL: Opens eyes, able to answer questions SKIN: skin color, texture, turgor are normal, no rashes or significant lesions EYES: normal, Conjunctiva are pink and non-injected, sclera clear LUNGS: Clear HEART: regular rate & rhythm and no murmurs, 2+ bilateral lower extremity edema ABDOMEN: Significant abdominal distention NEURO: Sedated but opens eyes  LABORATORY DATA:  I have reviewed the data as listed CMP Latest Ref  Rng & Units 05/01/2020 04/30/2020 04/30/2020  Glucose 70 - 99 mg/dL 143(H) 203(H) 236(H)  BUN 8 - 23 mg/dL 67(H) 66(H) 57(H)  Creatinine 0.61 - 1.24 mg/dL 1.90(H) 2.10(H) 1.76(H)  Sodium 135 - 145 mmol/L 147(H) 143 142  Potassium 3.5 - 5.1 mmol/L 4.9 5.1 5.9(H)   Chloride 98 - 111 mmol/L 110 109 107  CO2 22 - 32 mmol/L $RemoveB'26 23 24  'KAGGWaLe$ Calcium 8.9 - 10.3 mg/dL 7.9(L) 7.7(L) 8.2(L)  Total Protein 6.5 - 8.1 g/dL - - 5.0(L)  Total Bilirubin 0.3 - 1.2 mg/dL - - 0.4  Alkaline Phos 38 - 126 U/L - - 70  AST 15 - 41 U/L - - 19  ALT 0 - 44 U/L - - 17   . CBC Latest Ref Rng & Units 05/01/2020 04/30/2020 05/20/2020  WBC 4.0 - 10.5 K/uL 1.9(L) 1.7(L) 2.5(L)  Hemoglobin 13.0 - 17.0 g/dL 7.0(L) 8.3(L) 9.6(L)  Hematocrit 39 - 52 % 22.4(L) 25.9(L) 30.1(L)  Platelets 150 - 400 K/uL 54(L) 69(L) 82(L)    Lab Results  Component Value Date   WBC 1.9 (L) 05/01/2020   HGB 7.0 (L) 05/01/2020   HCT 22.4 (L) 05/01/2020   MCV 91.4 05/01/2020   PLT 54 (L) 05/01/2020   NEUTROABS 0.7 (L) 04/28/2020    CT ABDOMEN PELVIS WO CONTRAST  Addendum Date: 04/19/2020   ADDENDUM REPORT: 04/19/2020 12:54 ADDENDUM: The peritoneal fluid within the pelvis is not enhancing as this is a non IV contrast imaging exam. Therefore would described fluid collection as having thickened rim. Favor peritoneal metastasis. Findings conveyed toSCOTT GOLDSTON on 04/19/2020  at12:54. Electronically Signed   By: Suzy Bouchard M.D.   On: 04/19/2020 12:54   Result Date: 04/19/2020 CLINICAL DATA:  Abdominal pain.  Tachycardia.  Mantle cell lymphoma EXAM: CT ABDOMEN AND PELVIS WITHOUT CONTRAST TECHNIQUE: Multidetector CT imaging of the abdomen and pelvis was performed following the standard protocol without IV contrast. COMPARISON:  PET-CT scan 01/02/2020 FINDINGS: Lower chest: New small RIGHT effusion. No pericardial effusion. There is new precordial adenopathy anterior to the RIGHT heart and liver (image 9/2). Hepatobiliary: New intraperitoneal free fluid along the margin of the RIGHT hepatic lobe. This fluid is simple attenuation. The liver appears unchanged on noncontrast exam. Large cyst the LEFT hepatic lobe. Gallbladder normal. Pancreas: Pancreas is grossly normal noncontrast exam Spleen: . spleen is  unchanged Adrenals/urinary tract: Adrenal glands kidneys and ureters normal. Bladder Stomach/Bowel: Stomach duodenum normal. There is a new mass lesion in the LEFT upper quadrant measuring 14.2 by 13.0 cm. The mass may be associated the small bowel or the small bowel mesentery. The descending colon is also involving the mass (image 43/2. There is extensive fluid within the leaves of the mesentery of the distal small bowel. Appendix normal. Ascending transverse colon normal. The descending colon tracks along the new LEFT upper quadrant mass. Vascular/Lymphatic: New large LEFT upper quadrant mass either represents lymphoma involving the small bowel or mesentery. Mass is very large described the stomach section. Additionally there is new periportal adenopathy with enlarged lymph nodes along the aorta measuring up to 2 cm (image 44/2. There is nodularity along the greater omentum abdominal on the LEFT. Reproductive: Prostate unremarkable Other: Small volume free fluid the pelvis. There is enhancing rim to the fluid (image 73/2 which could indicate peritonitis. Musculoskeletal: No aggressive osseous lesion. IMPRESSION: 1. Dominant finding is new large (15 cm) lymphoid mass within LEFT upper quadrant. Mass is new from PET-CT 01/02/2020. Differential includes massive mesenteric adenopathy versus  lymphoma of the small bowel or descending colon. No oral or IV contrast. 2. New periaortic retroperitoneal lymphadenopathy. New precordial adenopathy. 3. Extensive fluid within the mesentery and omental thickening in LEFT lower quadrant. 4. Free fluid the pelvis with thin enhancing rim suggest peritonitis. 5. No evidence of bowel perforation. No evidence of bowel obstruction. Electronically Signed: By: Suzy Bouchard M.D. On: 04/19/2020 12:39   DG Chest 1 View  Result Date: 05/15/2020 CLINICAL DATA:  Elevated blood pressure. EXAM: CHEST  1 VIEW COMPARISON:  April 29, 2020 FINDINGS: The lung volumes are low. There are small  bilateral pleural effusions with adjacent atelectasis. The heart size is mildly enlarged. The lung volumes are low. There is no pneumothorax. IMPRESSION: 1. Low lung volumes. 2. Small bilateral pleural effusions with adjacent atelectasis. Electronically Signed   By: Constance Holster M.D.   On: 04/28/2020 20:42   DG Chest 2 View  Result Date: 05/16/2020 CLINICAL DATA:  Shortness of breath lasting several days. Groin swelling and redness. EXAM: CHEST - 2 VIEW COMPARISON:  Chest CT 04/19/20 FINDINGS: Stable cardiomediastinal contours. Low lung volumes. Unchanged small bilateral pleural effusions. No airspace densities. IMPRESSION: Low lung volumes with persistent small bilateral pleural effusions. Electronically Signed   By: Kerby Moors M.D.   On: 05/04/2020 05:09   DG Abd 1 View  Result Date: 04/23/2020 CLINICAL DATA:  Tube placement EXAM: ABDOMEN - 1 VIEW COMPARISON:  April 29, 2020 FINDINGS: The enteric tube projects over the gastric antrum/pylorus. The tube is pointed distally. The visualized bowel gas pattern is unremarkable. IMPRESSION: The enteric tube projects over the gastric antrum/pylorus. Electronically Signed   By: Constance Holster M.D.   On: 05/10/2020 22:05   CT CHEST WO CONTRAST  Result Date: 04/19/2020 CLINICAL DATA:  Pneumonia. Pleural effusion. Concern for metastases. EXAM: CT CHEST WITHOUT CONTRAST TECHNIQUE: Multidetector CT imaging of the chest was performed following the standard protocol without IV contrast. COMPARISON:  CT dated Nov 14, 2019 FINDINGS: Cardiovascular: The heart size is stable. Coronary artery calcifications are noted. There is no significant pericardial effusion. Mediastinum/Nodes: -- No mediastinal lymphadenopathy. -- No hilar lymphadenopathy. --there is left axillary adenopathy. This has progressed since the prior study. There is mild right axillary adenopathy, progressed from prior study. -- No supraclavicular lymphadenopathy. -- Normal thyroid gland  where visualized. -there is mild diffuse esophageal wall thickening. Lungs/Pleura: There are trace to small bilateral pleural effusions, right greater than left. There is atelectasis at the right lung base. There is no pneumothorax. No area of consolidation concerning for pneumonia. Upper Abdomen: There is a partially visualized mass in the left upper quadrant, better visualized on the patient's prior CT. There is free fluid in the upper abdomen. Multiple enlarged pericardiophrenic lymph nodes are noted. There appears to be retroperitoneal adenopathy. Musculoskeletal: No chest wall abnormality. No bony spinal canal stenosis. IMPRESSION: 1. Trace to small bilateral pleural effusions, right greater than left. There is atelectasis at the right lung base. No area of consolidation concerning for pneumonia. 2. Mild diffuse esophageal wall thickening. Correlation with patient's symptoms is recommended. 3. Worsening axillary adenopathy. 4. Partially visualized mass in the left upper quadrant, better visualized on the patient's prior CT. 5. Small volume ascites. Aortic Atherosclerosis (ICD10-I70.0). Electronically Signed   By: Constance Holster M.D.   On: 04/19/2020 20:37   CT Angio Chest PE W and/or Wo Contrast  Result Date: 05/06/2020 CLINICAL DATA:  69 year old male with history of shortness of breath for the past few days. Abdominal distension.  History of mantle cell lymphoma. EXAM: CT ANGIOGRAPHY CHEST CT ABDOMEN AND PELVIS WITH CONTRAST TECHNIQUE: Multidetector CT imaging of the chest was performed using the standard protocol during bolus administration of intravenous contrast. Multiplanar CT image reconstructions and MIPs were obtained to evaluate the vascular anatomy. Multidetector CT imaging of the abdomen and pelvis was performed using the standard protocol during bolus administration of intravenous contrast. CONTRAST:  153mL OMNIPAQUE IOHEXOL 350 MG/ML SOLN COMPARISON:  CT the abdomen and pelvis 04/19/2020.  CT the chest 04/19/2020. FINDINGS: CTA CHEST FINDINGS Cardiovascular: Study is limited by patient respiratory motion. However, despite this limitation there are segmental sized filling defects within pulmonary arteries to the left upper and lower lobes best appreciated on axial images 73 and 106 of series 5, and axial image 148 of series 5 respectively, compatible with pulmonary embolism. Heart size is normal. There is no significant pericardial fluid, thickening or pericardial calcification. Aortic atherosclerosis. No definite coronary artery calcifications. Mediastinum/Nodes: Multiple enlarged anterior mediastinal lymph nodes noted inferiorly, immediately above the right hemidiaphragm, measuring up to 2.3 cm in short axis (axial image 13 of series 11), increased compared to the prior study. Prominent but nonenlarged distal paraesophageal lymph node measuring 8 mm in short axis, nonspecific. Circumferential thickening of the distal third of the esophagus. Mildly enlarged left axillary lymph nodes measuring up to 1.3 cm in short axis. Lungs/Pleura: Collapse of the trachea and mainstem bronchi during expiration, indicative of tracheobronchomalacia. No acute consolidative airspace disease. Small bilateral pleural effusions lying dependently. No definite suspicious appearing pulmonary nodules or masses are noted. Musculoskeletal: There are no aggressive appearing lytic or blastic lesions noted in the visualized portions of the skeleton. Review of the MIP images confirms the above findings. CT ABDOMEN and PELVIS FINDINGS Hepatobiliary: Multiple well-defined low-attenuation lesions are again noted throughout the liver, similar in size, number and distribution to the prior study, largest of which are compatible with simple cysts measuring up to 5.3 x 3.7 cm in segment 2 and for a (axial image 18 of series 11). Smaller lesions are too small to definitively characterize, but also favored to represent small cysts or biliary  hamartomas. No new suspicious hepatic lesions. No intra or extrahepatic biliary ductal dilatation. 6 mm calcified gallstone lying dependently in the gallbladder. Gallbladder is not distended. Pancreas: No pancreatic mass. No pancreatic ductal dilatation. No pancreatic or peripancreatic fluid collections or inflammatory changes. Spleen: Unremarkable. Adrenals/Urinary Tract: 1.2 cm low-attenuation lesion in the upper pole of the right kidney and exophytic 2.4 cm low-attenuation lesion in the upper pole of the left kidney, compatible with simple cysts. Other subcentimeter low-attenuation lesions in both kidneys, too small to definitively characterize, but favored to represent tiny cysts. No hydroureteronephrosis. Urinary bladder is nearly decompressed, but otherwise unremarkable in appearance. Stomach/Bowel: Stomach is unremarkable in appearance. No pathologic dilatation of small bowel or colon. Multiple bowel loops in the left upper quadrant are intimately associated with the large soft tissue mass (discussed below). Colonic diverticulosis, particularly in the sigmoid colon, without definitive evidence to suggest an acute diverticulitis at this time. Vascular/Lymphatic: Aortic atherosclerosis, without evidence of aneurysm or dissection in the abdominal or pelvic vasculature. Extensive lymphadenopathy noted in the abdomen, most evident in the retroperitoneum where there are bulky para-aortic nodal masses measuring up to 2.3 x 4.1 cm near the left renal artery (axial image 41 of series 11), 3.2 x 4.6 cm adjacent to the infrarenal abdominal aorta (axial image 48 of series 11), and 3.4 x 3.2 cm adjacent to the  aortic bifurcation (axial image 54 of series 11), all of which appear similar to the recent prior study. Bulky celiac axis and gastrohepatic ligament lymph nodes are noted, measuring up to 1.9 cm in short axis (axial image 24 of series 11). Several prominent but nonenlarged pelvic lymph nodes are noted  (nonspecific). Reproductive: Prostate gland and seminal vesicles are unremarkable in appearance. Other: Bulky poorly defined soft tissue mass in the left upper quadrant of the abdomen which is intimately associated with multiple adjacent bowel loops, and difficult to distinctly defined, but estimated to measure approximately 15.5 x 13.5 x 13.4 cm (axial image 38 of series 11 and coronal image 53 of series 14). Moderate volume of ascites. Diffuse enhancement of the peritoneal membranes, indicative of widespread peritoneal metastatic disease. Extensive omental caking, best appreciated in the left side of the abdomen where the bulkiest portion of this measures approximately 14.9 x 4.0 cm (axial image 47 of series 11). No pneumoperitoneum. Musculoskeletal: There are no aggressive appearing lytic or blastic lesions noted in the visualized portions of the skeleton. Review of the MIP images confirms the above findings. IMPRESSION: 1. Study is positive for segmental sized pulmonary emboli in the left lung. These appear nonocclusive at this time, and there is no associated pulmonary hemorrhage to suggest frank pulmonary infarct. 2. Bulky soft tissue mass in the left upper quadrant with extensive lymphadenopathy in the chest and abdomen, and evidence of widespread intraperitoneal metastatic disease, as detailed above. 3. Small bilateral pleural effusions lying dependently. 4. Aortic atherosclerosis. 5. Tracheobronchomalacia. 6. Cholelithiasis. 7. Colonic diverticulosis. Electronically Signed   By: Vinnie Langton M.D.   On: 04/24/2020 12:50   CT Abdomen Pelvis W Contrast  Result Date: 04/28/2020 CLINICAL DATA:  69 year old male with history of shortness of breath for the past few days. Abdominal distension. History of mantle cell lymphoma. EXAM: CT ANGIOGRAPHY CHEST CT ABDOMEN AND PELVIS WITH CONTRAST TECHNIQUE: Multidetector CT imaging of the chest was performed using the standard protocol during bolus administration of  intravenous contrast. Multiplanar CT image reconstructions and MIPs were obtained to evaluate the vascular anatomy. Multidetector CT imaging of the abdomen and pelvis was performed using the standard protocol during bolus administration of intravenous contrast. CONTRAST:  119mL OMNIPAQUE IOHEXOL 350 MG/ML SOLN COMPARISON:  CT the abdomen and pelvis 04/19/2020. CT the chest 04/19/2020. FINDINGS: CTA CHEST FINDINGS Cardiovascular: Study is limited by patient respiratory motion. However, despite this limitation there are segmental sized filling defects within pulmonary arteries to the left upper and lower lobes best appreciated on axial images 73 and 106 of series 5, and axial image 148 of series 5 respectively, compatible with pulmonary embolism. Heart size is normal. There is no significant pericardial fluid, thickening or pericardial calcification. Aortic atherosclerosis. No definite coronary artery calcifications. Mediastinum/Nodes: Multiple enlarged anterior mediastinal lymph nodes noted inferiorly, immediately above the right hemidiaphragm, measuring up to 2.3 cm in short axis (axial image 13 of series 11), increased compared to the prior study. Prominent but nonenlarged distal paraesophageal lymph node measuring 8 mm in short axis, nonspecific. Circumferential thickening of the distal third of the esophagus. Mildly enlarged left axillary lymph nodes measuring up to 1.3 cm in short axis. Lungs/Pleura: Collapse of the trachea and mainstem bronchi during expiration, indicative of tracheobronchomalacia. No acute consolidative airspace disease. Small bilateral pleural effusions lying dependently. No definite suspicious appearing pulmonary nodules or masses are noted. Musculoskeletal: There are no aggressive appearing lytic or blastic lesions noted in the visualized portions of the skeleton. Review of  the MIP images confirms the above findings. CT ABDOMEN and PELVIS FINDINGS Hepatobiliary: Multiple well-defined  low-attenuation lesions are again noted throughout the liver, similar in size, number and distribution to the prior study, largest of which are compatible with simple cysts measuring up to 5.3 x 3.7 cm in segment 2 and for a (axial image 18 of series 11). Smaller lesions are too small to definitively characterize, but also favored to represent small cysts or biliary hamartomas. No new suspicious hepatic lesions. No intra or extrahepatic biliary ductal dilatation. 6 mm calcified gallstone lying dependently in the gallbladder. Gallbladder is not distended. Pancreas: No pancreatic mass. No pancreatic ductal dilatation. No pancreatic or peripancreatic fluid collections or inflammatory changes. Spleen: Unremarkable. Adrenals/Urinary Tract: 1.2 cm low-attenuation lesion in the upper pole of the right kidney and exophytic 2.4 cm low-attenuation lesion in the upper pole of the left kidney, compatible with simple cysts. Other subcentimeter low-attenuation lesions in both kidneys, too small to definitively characterize, but favored to represent tiny cysts. No hydroureteronephrosis. Urinary bladder is nearly decompressed, but otherwise unremarkable in appearance. Stomach/Bowel: Stomach is unremarkable in appearance. No pathologic dilatation of small bowel or colon. Multiple bowel loops in the left upper quadrant are intimately associated with the large soft tissue mass (discussed below). Colonic diverticulosis, particularly in the sigmoid colon, without definitive evidence to suggest an acute diverticulitis at this time. Vascular/Lymphatic: Aortic atherosclerosis, without evidence of aneurysm or dissection in the abdominal or pelvic vasculature. Extensive lymphadenopathy noted in the abdomen, most evident in the retroperitoneum where there are bulky para-aortic nodal masses measuring up to 2.3 x 4.1 cm near the left renal artery (axial image 41 of series 11), 3.2 x 4.6 cm adjacent to the infrarenal abdominal aorta (axial image  48 of series 11), and 3.4 x 3.2 cm adjacent to the aortic bifurcation (axial image 54 of series 11), all of which appear similar to the recent prior study. Bulky celiac axis and gastrohepatic ligament lymph nodes are noted, measuring up to 1.9 cm in short axis (axial image 24 of series 11). Several prominent but nonenlarged pelvic lymph nodes are noted (nonspecific). Reproductive: Prostate gland and seminal vesicles are unremarkable in appearance. Other: Bulky poorly defined soft tissue mass in the left upper quadrant of the abdomen which is intimately associated with multiple adjacent bowel loops, and difficult to distinctly defined, but estimated to measure approximately 15.5 x 13.5 x 13.4 cm (axial image 38 of series 11 and coronal image 53 of series 14). Moderate volume of ascites. Diffuse enhancement of the peritoneal membranes, indicative of widespread peritoneal metastatic disease. Extensive omental caking, best appreciated in the left side of the abdomen where the bulkiest portion of this measures approximately 14.9 x 4.0 cm (axial image 47 of series 11). No pneumoperitoneum. Musculoskeletal: There are no aggressive appearing lytic or blastic lesions noted in the visualized portions of the skeleton. Review of the MIP images confirms the above findings. IMPRESSION: 1. Study is positive for segmental sized pulmonary emboli in the left lung. These appear nonocclusive at this time, and there is no associated pulmonary hemorrhage to suggest frank pulmonary infarct. 2. Bulky soft tissue mass in the left upper quadrant with extensive lymphadenopathy in the chest and abdomen, and evidence of widespread intraperitoneal metastatic disease, as detailed above. 3. Small bilateral pleural effusions lying dependently. 4. Aortic atherosclerosis. 5. Tracheobronchomalacia. 6. Cholelithiasis. 7. Colonic diverticulosis. Electronically Signed   By: Vinnie Langton M.D.   On: 04/28/2020 12:50   DG CHEST PORT 1 VIEW  Result  Date: 04/30/2020 CLINICAL DATA:  Central line placement EXAM: PORTABLE CHEST 1 VIEW COMPARISON:  Radiograph 04/27/2020 FINDINGS: Endotracheal tube tip terminates 2.2 cm from the expected location of the carina. Transesophageal tube tip and side port distal to the GE junction. Right IJ catheter tip terminates in the right atrium. Telemetry leads overlie the chest. Layering bilateral effusions adjacent passive atelectasis. Additional bandlike subsegmental atelectasis in the lungs as well. Stable borderline cardiomegaly accounting for portable technique. No acute osseous or soft tissue abnormality. Degenerative changes are present in the imaged spine and shoulders. IMPRESSION: 1. Endotracheal tube tip terminates 2.2 cm from the expected location of the carina. Could consider retraction 2 cm to the mid trachea. 2. Transesophageal tube tip and side port distal to the GE junction. 3. Right IJ catheter tip terminates in the right atrium. 4. Layering bilateral effusions with adjacent passive atelectasis and vascular congestion. 5. Stable borderline cardiomegaly. Electronically Signed   By: Lovena Le M.D.   On: 04/30/2020 01:14   DG CHEST PORT 1 VIEW  Result Date: 05/02/2020 CLINICAL DATA:  Endotracheal tube placement EXAM: PORTABLE CHEST 1 VIEW COMPARISON:  April 29, 2020 FINDINGS: The endotracheal tube terminates above the carina. The lung volumes are low. Again identified are bilateral pleural effusions with adjacent atelectasis. The heart size is stable. There is no pneumothorax. The enteric tube terminates below the left hemidiaphragm. IMPRESSION: Lines and tubes as above.  Otherwise, stable exam. Electronically Signed   By: Constance Holster M.D.   On: 05/08/2020 22:04   DG Chest Portable 1 View  Result Date: 04/19/2020 CLINICAL DATA:  Cough, wheezing EXAM: PORTABLE CHEST 1 VIEW COMPARISON:  01/09/2020 FINDINGS: Low lung volumes. Minimal atelectasis or scarring at the left lung base. No pleural effusion  or pneumothorax. Heart size is within normal limits for technique. IMPRESSION: Minimal atelectasis or scarring at the left lung base. Electronically Signed   By: Macy Mis M.D.   On: 04/19/2020 12:18   DG Abd Portable 2 Views  Result Date: 04/25/2020 CLINICAL DATA:  Abdominal distension and pain. EXAM: PORTABLE ABDOMEN - 2 VIEW COMPARISON:  04/19/2020 FINDINGS: There is mild gaseous distension of the stomach. No dilated loops of large or small bowel. There is no evidence of free air. No radio-opaque calculi or other significant radiographic abnormality is seen. IMPRESSION: Nonobstructive bowel gas pattern. Electronically Signed   By: Kerby Moors M.D.   On: 04/28/2020 06:12   ECHOCARDIOGRAM COMPLETE  Result Date: 04/20/2020    ECHOCARDIOGRAM REPORT   Patient Name:   ANUBIS FUNDORA Date of Exam: 04/20/2020 Medical Rec #:  [id number]    Height:       64.0 in Accession #:    [id number]   Weight:       210.1 lb Date of Birth:  October 18, 1950   BSA:          1.998 m Patient Age:    69 years     BP:           156/83 mmHg Patient Gender: M            HR:           88 bpm. Exam Location:  Inpatient Procedure: 2D Echo, Cardiac Doppler and Color Doppler Indications:    Chemo evaluation  History:        Patient has prior history of Echocardiogram examinations, most                 recent 11/14/2019. COPD;  Risk Factors:Hypertension and Diabetes.                 Mantle cell lymphoma.  Sonographer:    Dustin Flock Referring Phys: [place]  1. Left ventricular ejection fraction, by estimation, is 65 to 70%. The left ventricle has normal function. The left ventricle has no regional wall motion abnormalities. Left ventricular diastolic parameters are consistent with Grade I diastolic dysfunction (impaired relaxation).  2. Right ventricular systolic function is normal. The right ventricular size is normal. Tricuspid regurgitation signal is inadequate for assessing PA pressure.  3. The mitral  valve is grossly normal. Trivial mitral valve regurgitation. No evidence of mitral stenosis.  4. The aortic valve is tricuspid. There is mild calcification of the aortic valve. There is mild thickening of the aortic valve. Aortic valve regurgitation is not visualized. Mild aortic valve sclerosis is present, with no evidence of aortic valve stenosis.  5. The inferior vena cava is normal in size with greater than 50% respiratory variability, suggesting right atrial pressure of 3 mmHg. Comparison(s): Changes from prior study are noted. EF is now 65-70%. FINDINGS  Left Ventricle: Left ventricular ejection fraction, by estimation, is 65 to 70%. The left ventricle has normal function. The left ventricle has no regional wall motion abnormalities. The left ventricular internal cavity size was normal in size. There is  no left ventricular hypertrophy. Left ventricular diastolic parameters are consistent with Grade I diastolic dysfunction (impaired relaxation). Normal left ventricular filling pressure. Right Ventricle: The right ventricular size is normal. No increase in right ventricular wall thickness. Right ventricular systolic function is normal. Tricuspid regurgitation signal is inadequate for assessing PA pressure. Left Atrium: Left atrial size was normal in size. Right Atrium: Right atrial size was normal in size. Pericardium: Trivial pericardial effusion is present. Presence of pericardial fat pad. Mitral Valve: The mitral valve is grossly normal. Trivial mitral valve regurgitation. No evidence of mitral valve stenosis. Tricuspid Valve: The tricuspid valve is grossly normal. Tricuspid valve regurgitation is not demonstrated. No evidence of tricuspid stenosis. Aortic Valve: The aortic valve is tricuspid. There is mild calcification of the aortic valve. There is mild thickening of the aortic valve. Aortic valve regurgitation is not visualized. Mild aortic valve sclerosis is present, with no evidence of aortic valve  stenosis. Aortic valve mean gradient measures 11.1 mmHg. Aortic valve peak gradient measures 16.7 mmHg. Aortic valve area, by VTI measures 2.42 cm. Pulmonic Valve: The pulmonic valve was grossly normal. Pulmonic valve regurgitation is not visualized. No evidence of pulmonic stenosis. Aorta: The aortic root is normal in size and structure. Venous: The right upper pulmonary vein is normal. The inferior vena cava is normal in size with greater than 50% respiratory variability, suggesting right atrial pressure of 3 mmHg. IAS/Shunts: The atrial septum is grossly normal. Additional Comments: There is a small pleural effusion in the left lateral region.  LEFT VENTRICLE PLAX 2D LVIDd:         4.30 cm  Diastology LVIDs:         2.60 cm  LV e' medial:    8.81 cm/s LV PW:         1.10 cm  LV E/e' medial:  7.2 LV IVS:        1.10 cm  LV e' lateral:   7.51 cm/s LVOT diam:     2.30 cm  LV E/e' lateral: 8.4 LV SV:         92 LV SV Index:  46 LVOT Area:     4.15 cm  RIGHT VENTRICLE RV Basal diam:  3.20 cm RV S prime:     11.00 cm/s TAPSE (M-mode): 2.9 cm LEFT ATRIUM             Index       RIGHT ATRIUM           Index LA diam:        3.30 cm 1.65 cm/m  RA Area:     15.30 cm LA Vol (A2C):   43.2 ml 21.62 ml/m RA Volume:   37.60 ml  18.82 ml/m LA Vol (A4C):   30.7 ml 15.37 ml/m LA Biplane Vol: 36.9 ml 18.47 ml/m  AORTIC VALVE AV Area (Vmax):    2.42 cm AV Area (Vmean):   2.14 cm AV Area (VTI):     2.42 cm AV Vmax:           204.20 cm/s AV Vmean:          159.222 cm/s AV VTI:            0.381 m AV Peak Grad:      16.7 mmHg AV Mean Grad:      11.1 mmHg LVOT Vmax:         119.00 cm/s LVOT Vmean:        82.200 cm/s LVOT VTI:          0.222 m LVOT/AV VTI ratio: 0.58  AORTA Ao Root diam: 3.10 cm MITRAL VALVE MV Area (PHT): 4.06 cm     SHUNTS MV Decel Time: 187 msec     Systemic VTI:  0.22 m MV E velocity: 63.40 cm/s   Systemic Diam: 2.30 cm MV A velocity: 104.00 cm/s MV E/A ratio:  0.61 Eleonore Chiquito MD Electronically signed  by Eleonore Chiquito MD Signature Date/Time: 04/20/2020/10:32:25 AM    Final    [place] ASCITES (ABDOMEN LIMITED)  Result Date: 04/19/2020 CLINICAL DATA:  69 year old male with abdominal distension, ascites EXAM: LIMITED ABDOMEN ULTRASOUND FOR ASCITES TECHNIQUE: Limited ultrasound survey for ascites was performed in all four abdominal quadrants. COMPARISON:  CT abdomen/pelvis 04/19/2020 FINDINGS: Sonographic interrogation of the abdomen was performed in anticipation of paracentesis. Unfortunately, the volume is a ascites is quite small and insufficient for drainage. There is however extensive omental caking and peritoneal nodularity. IMPRESSION: 1. Small volume ascites, insufficient for paracentesis. 2. Extensive omental caking and peritoneal nodularity consistent with peritoneal carcinomatosis. Electronically Signed   By: Jacqulynn Cadet M.D.   On: 04/19/2020 16:53   VAS [place] LOWER EXTREMITY VENOUS (DVT)  Result Date: 05/01/2020  Lower Venous DVT Study Indications: Pain, Swelling, and Edema.  Risk Factors: Immobility confirmed PE. Performing Technologist: Griffin Basil RCT RDMS  Examination Guidelines: A complete evaluation includes B-mode imaging, spectral Doppler, color Doppler, and power Doppler as needed of all accessible portions of each vessel. Bilateral testing is considered an integral part of a complete examination. Limited examinations for reoccurring indications may be performed as noted. The reflux portion of the exam is performed with the patient in reverse Trendelenburg.  +---------+---------------+---------+-----------+----------+--------------+ RIGHT    CompressibilityPhasicitySpontaneityPropertiesThrombus Aging +---------+---------------+---------+-----------+----------+--------------+ CFV      Full           Yes      Yes                                 +---------+---------------+---------+-----------+----------+--------------+ SFJ      Full                                                         +---------+---------------+---------+-----------+----------+--------------+  FV Prox  Full                                                        +---------+---------------+---------+-----------+----------+--------------+ FV Mid   Full                                                        +---------+---------------+---------+-----------+----------+--------------+ FV DistalFull                                                        +---------+---------------+---------+-----------+----------+--------------+ PFV      Full                                                        +---------+---------------+---------+-----------+----------+--------------+ POP      Full           Yes      Yes                                 +---------+---------------+---------+-----------+----------+--------------+ PTV      Full                                                        +---------+---------------+---------+-----------+----------+--------------+ PERO     Full                                                        +---------+---------------+---------+-----------+----------+--------------+   +---------+---------------+---------+-----------+----------+--------------+ LEFT     CompressibilityPhasicitySpontaneityPropertiesThrombus Aging +---------+---------------+---------+-----------+----------+--------------+ CFV      Full           Yes      Yes                                 +---------+---------------+---------+-----------+----------+--------------+ SFJ      Full                                                        +---------+---------------+---------+-----------+----------+--------------+ FV Prox  Full                                                        +---------+---------------+---------+-----------+----------+--------------+  FV Mid   Full                                                         +---------+---------------+---------+-----------+----------+--------------+ FV DistalFull                                                        +---------+---------------+---------+-----------+----------+--------------+ PFV      Full                                                        +---------+---------------+---------+-----------+----------+--------------+ POP      Full           Yes      Yes                                 +---------+---------------+---------+-----------+----------+--------------+ PTV      Full                                                        +---------+---------------+---------+-----------+----------+--------------+ PERO     Full                                                        +---------+---------------+---------+-----------+----------+--------------+     Summary: RIGHT: - There is no evidence of deep vein thrombosis in the lower extremity.  - No cystic structure found in the popliteal fossa.  LEFT: - There is no evidence of deep vein thrombosis in the lower extremity.  - No cystic structure found in the popliteal fossa.  *See table(s) above for measurements and observations. Electronically signed by Curt Jews MD on 05/01/2020 at 2:50:45 PM.    Final    ECHOCARDIOGRAM LIMITED  Result Date: 04/30/2020    ECHOCARDIOGRAM LIMITED REPORT   Patient Name:   DELLIS VOGHT Date of Exam: 04/30/2020 Medical Rec #:  [id number]    Height:       64.0 in Accession #:    [id number]   Weight:       205.0 lb Date of Birth:  [birth year]   BSA:          1.977 m Patient Age:    69 years     BP:           114/60 mmHg Patient Gender: M            HR:           109 bpm. Exam Location:  Inpatient Procedure: Limited Echo, Limited Color Doppler and Cardiac Doppler Indications:    Pulmonary Embolus I26.99  History:  Patient has prior history of Echocardiogram examinations, most                 recent 04/20/2020. Mantle cell lymphoma; Risk                  Factors:Hypertension and Diabetes.  Sonographer:    Mikki Santee RDCS (AE) Referring Phys: [id number] Jonnie Finner  Sonographer Comments: Echo performed with patient supine and on artificial respirator. IMPRESSIONS  1. Cystic structure noted in liver on Subcostal images.  2. Left ventricular ejection fraction, by estimation, is 60 to 65%. The left ventricle has normal function. The left ventricle has no regional wall motion abnormalities.  3. Right ventricular systolic function is normal. The right ventricular size is normal.  4. The pericardial effusion is posterior to the left ventricle.  5. The mitral valve is normal in structure. No evidence of mitral valve regurgitation. No evidence of mitral stenosis.  6. The aortic valve is normal in structure. Aortic valve regurgitation is not visualized. No aortic stenosis is present.  7. The inferior vena cava is normal in size with greater than 50% respiratory variability, suggesting right atrial pressure of 3 mmHg. FINDINGS  Left Ventricle: Left ventricular ejection fraction, by estimation, is 60 to 65%. The left ventricle has normal function. The left ventricle has no regional wall motion abnormalities. The left ventricular internal cavity size was normal in size. There is  no left ventricular hypertrophy. Right Ventricle: The right ventricular size is normal. No increase in right ventricular wall thickness. Right ventricular systolic function is normal. Left Atrium: Left atrial size was normal in size. Right Atrium: Right atrial size was normal in size. Pericardium: Trivial pericardial effusion is present. The pericardial effusion is posterior to the left ventricle. Mitral Valve: The mitral valve is normal in structure. No evidence of mitral valve stenosis. Tricuspid Valve: The tricuspid valve is normal in structure. Tricuspid valve regurgitation is mild . No evidence of tricuspid stenosis. Aortic Valve: The aortic valve is normal in structure. Aortic valve  regurgitation is not visualized. No aortic stenosis is present. Pulmonic Valve: The pulmonic valve was normal in structure. Pulmonic valve regurgitation is trivial. No evidence of pulmonic stenosis. Aorta: The aortic root is normal in size and structure. Venous: The inferior vena cava is normal in size with greater than 50% respiratory variability, suggesting right atrial pressure of 3 mmHg. IAS/Shunts: No atrial level shunt detected by color flow Doppler. Additional Comments: Cystic structure noted in liver on Subcostal images. LEFT VENTRICLE PLAX 2D LVIDd:         4.20 cm LVIDs:         2.60 cm LV PW:         1.00 cm LV IVS:        1.10 cm LVOT diam:     2.30 cm LVOT Area:     4.15 cm  RIGHT VENTRICLE RV S prime:     30.80 cm/s TAPSE (M-mode): 1.8 cm LEFT ATRIUM         Index LA diam:    3.00 cm 1.52 cm/m   AORTA Ao Root diam: 3.40 cm  SHUNTS Systemic Diam: 2.30 cm Jenkins Rouge MD Electronically signed by Jenkins Rouge MD Signature Date/Time: 04/30/2020/9:18:44 AM    Final    [place] EKG SITE RITE  Result Date: 04/19/2020 If Site Rite image not attached, placement could not be confirmed due to current cardiac rhythm.   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 CD5+ clonal lymphoproliferative disorder" -04/26/2019 FISH revealed "Positive for p53 (17p13) deletion." -05/31/2019 PET/CT ([id number]) revealed "1. There are multiple prominent lymph nodes in the neck, both axilla, the retroperitoneum and pelvis with low level hypermetabolic activity consistent with chronic lymphocytic leukemia. Deauville 3 and 4. 2. Mild splenomegaly and mild generalized splenic hypermetabolic activity 3. No evidence of bone marrow involvement." -06/10/2019 Flow Pathology Report ([id number]) revealed "Abnormal B-cell population." -06/10/2019 Surgical Pathology Report  ([id number])  revealed "Atypical lymphoid proliferation consistent with mantle cell lymphoma." -09/19/19 PET Skull Base to Thigh ([id number])-- favorable response to current treatment -01/02/2020 PET/CT ([id number]) revealed "1. Continued further decrease in size and FDG accumulation in index cervical, axillary, retroperitoneal, and pelvic lymph nodes. No lymphadenopathy by size criteria today. FDG accumulation in these lymph nodes is compatible with Deauville 2 category. 2. Interval decrease in size with resolution of hypermetabolism associated with the spleen previously."  -04/19/2020 CT abdomen pelvis revealed "1. Dominant finding is new large (15 cm) lymphoid mass within LEFT upper quadrant. Mass is new from PET-CT 01/02/2020. Differential includes massive mesenteric adenopathy versus lymphoma of the small bowel or descending colon. No oral or IV contrast. 2. New periaortic retroperitoneal lymphadenopathy. New precordial Adenopathy. 3. Extensive fluid within the mesentery and omental thickening in LEFT lower quadrant. 4. Free fluid the pelvis with thin enhancing rim suggest peritonitis. 5. No evidence of bowel perforation. No evidence of bowel Obstruction." -05/17/2020 CTA chest and CT abdomen pelvis revealed "1. Study is positive for segmental sized pulmonary emboli in the left lung. These appear nonocclusive at this time, and there is no associated pulmonary hemorrhage to suggest frank pulmonary infarct. 2. Bulky soft tissue mass in the left upper quadrant with extensive lymphadenopathy in the chest and abdomen, and evidence of widespread intraperitoneal metastatic disease, as detailed above. 3. Small bilateral pleural effusions lying dependently. 4. Aortic atherosclerosis. 5. Tracheobronchomalacia. 6. Cholelithiasis. 7. Colonic diverticulosis."  2) Rigors from Rituxan - needing demerol as premedication. Rituxan rate to be capped at $RemoveB'150mg'ZXBjadrr$ /hour  3) Thrombocytopenia from chemotherapy  4)  Neutropenia due to recent chemotherapy  5) anemia  6) significant new abdominal distention due to lymphoma recurrence  7) new hypercalcemia- ?  Related to dehydration and  Lymphoma -resolved at this time  8) acute hypoxic respiratory failure-multifactorial due to abdominal distention, PE, and hypertensive emergency  9) left segmental PE  10) AKI  PLAN: -The patient has mantle cell lymphoma with 17 P mutation that is resistant to chemotherapy.  He received 1 cycle of CHOP with no improvement in his left upper quadrant soft tissue mass and abdominal distention. -Radiation oncology consult pending for consideration of radiation to his left upper quadrant mass. -I have spoken with our oral chemotherapy pharmacist and we have acalabrutinib available and ready to administer once the patient is stabilized.  -He continues to have fevers and remains pancytopenic.  We will plan to reevaluate the patient on 11/11 for consideration of starting acalabrutinib. -The patient has received G-CSF already and recommend close monitoring of his CBC with differential.  Transfuse for hemoglobin less than 8 and platelets less than 30,000 or active bleeding.  I have ordered 1 unit of PRBCs today. -Antibiotics per primary team. -Continue heparin drip.  We can consider transition to Lovenox versus DOAC prior to hospital discharge. -Will add on uric acid level to evaluate for tumor lysis syndrome.   LOS: 2  days   Mikey Bussing   ADDENDUM  .Patient was Personally and independently interviewed, examined and relevant elements of the history of present illness were reviewed in details and an assessment and plan was created. All elements of the patient's history of present illness , assessment and plan were discussed in details with Mikey Bussing  The above documentation reflects our combined findings assessment and plan.  Uric acid 15. Concern for significant TLS despite allopurinol -- will order Rasburicase x  1 dose.  Sullivan Lone MD MS

## 2020-05-01 NOTE — Progress Notes (Signed)
NAME:  Deloyd Handy, MRN:  [id number], DOB:  [birth year], LOS: 2 ADMISSION DATE:  04/28/2020, CONSULTATION DATE:  11/7/201 REFERRING MD:  TRH, CHIEF COMPLAINT:  Respiratory distress  Brief History   69 year old male with stage IV mantle cell lymphoma on CHOP presenting with N/V and progressive SOB over the last 2 weeks found to have acute left segmental PE.  Admitted to [facility] however developed hypertension 250/100 with respiratory distress with chest pain, unable to tolerate BiPAP and was intubated.      History of present illness   HPI obtained from medical chart review as patient is currently intubated and sedated on mechanical ventilation.  69 year old male with prior medical history of stage IV mantle cell lymphoma (diagnosed 06/20/2019) on CHOP, hypertension, diabetes type 2 who presented to [facility] on 11/7 with progressive shortness of breath over the last 2 weeks, worse over the last 2 days with above baseline abdominal distention, non bloody nausea/ vomiting, nonradiating intermittent midsternal chest tightness, and cough with lightly productive greenish nonbloody sputum.  Additionally reported ongoing swelling in his legs and testicles causing difficulty with urination.  Recent hospitalization 10/28 - 11/1 found to have new left upper quadrant abdominal mass and hypercalcemia.    In ER, he was afebrile, hypertensive, mildly tachypneic, and tachycardic with oxygen saturations of 98% on room air.  Noted to have multiple episodes of vomiting and retching treated with zofran.  KUB with non-obstructive gas pattern.   Labs noted for glucose 290 with HgbA1c 6.3, BUN 40, AG 17, lactic acid 3.2-> 2.3, troponin hs 52, BNP 133, D-dimer 2.47, Hgb 11.5/ HCT 35.5, platelets 100, neg COVID/ flu, UA noted turbid urine with 5 ketones, negative for leukocytes/ nitrates.  CXR showed small bilateral pleural effusion with atelectasis and low lung volumes.  He was pretreated given his contrast allergy. CTA PE and  CT abd/ pelvis showed segmental nonocclusive left pulmonary embolism without associated hemorrhage or pulmonary infarct.  Additionally noted bulky soft tissue mass in LUQ with extensive lymphadenopathy in the chest and abd with evidence of widespread intraperitoneal metastatic disease, small bilateral pleural effusion, and  tracheobronchomalacia. Started on heparin gtt per pharmacy.   He was admitted by [facility] to progressive care.  In unit, he was noted to become hypertensive with BP 250/100 with complaints of chest discomfort with noted tachypnea and lethargy.  He was treated with hydralazine and placed on nitro gtt for blood pressure management.  Repeated EKG was non acute.  ABG on NRB noted 7.291/ 56.7/ 275.  Bipap was attempted however patient kept pulling it off and therefore given concern for impending respiratory failure, he was intubated by anesthesia.  PCCM consulted for further management.   Past Medical History  Mantle cell lymphoma on CHOP, DM, HTN  Significant Hospital Events   11/07 Admitted to [place] decompensated/ intubated 11/08 Weaned on PSV, lasix with 2.2L UOP   Consults:  PCCM 11/7  Procedures:  ETT 11/7 >>  Significant Diagnostic Tests:  11/7 KUB >> nonobstructive gas pattern 11/7 CTA PE/  CT abd/ pelvis >> Study is positive for segmental sized pulmonary emboli in the left lung. These appear nonocclusive at this time, and there is no associated pulmonary hemorrhage to suggest frank pulmonary infarct. Bulky soft tissue mass in the left upper quadrant with extensive lymphadenopathy in the chest and abdomen, and evidence of widespread intraperitoneal metastatic disease. Small bilateral pleural effusions lying dependently. Aortic atherosclerosis.  Tracheobronchomalacia. Cholelithiasis. Colonic diverticulosis. 11/8 [place] Ascites/ABD >> small volume ascites,  insufficient for paracentesis, extensive omental caking and peritoneal nodularity consistent with peritoneal carcinomatosis  11/8  ECHO >> cystic structure noted in liver on subcostal images, LVEF 60-65%, no RWMA, RV systolic function normal, pericardial effusion is posterior to LV 11/8 LE Doppler >>   Micro Data:  SARS 2/ flu 11/7 >> neg MRSA 11/7 >> neg UA 11/7 >> negative, 5 ketones  Antimicrobials:  Previously on azithro/ ceftriaxone, stopped 10/30 Cefepime 11/8 >>   Interim history/subjective:  Tmax 102 PSV wean 5/5  Glucose trend 111-169  I/O 2.2L UOP, -1L in last 24 hours   Objective   Blood pressure 121/65, pulse (!) 121, temperature (!) 101.5 F (38.6 C), resp. rate 12, height 5\' 4"  (1.626 m), weight 93 kg, SpO2 92 %.    Vent Mode: CPAP;PSV FiO2 (%):  [30 %-35 %] 35 % Set Rate:  [16 bmp] 16 bmp Vt Set:  [470 mL] 470 mL PEEP:  [5 cmH20] 5 cmH20 Pressure Support:  [5 cmH20-8 cmH20] 5 cmH20 Plateau Pressure:  [10 ZRA07-62 cmH20] 10 cmH20   Intake/Output Summary (Last 24 hours) at 05/01/2020 2633 Last data filed at 05/01/2020 0700 Gross per 24 hour  Intake 1304.68 ml  Output 2425 ml  Net -1120.32 ml   Filed Weights   05/14/2020 0357 05/18/2020 1826 04/30/20 0315  Weight: 97.5 kg 94.9 kg 93 kg   Physical Exam: General: adult male lying in bed in NAD on vent HEENT: MM pink/moist, ETT, anicteric  Neuro: Awakens to voice, follows commands / interactive  CV: s1s2 rrr, no m/r/g PULM: non-labored on PSV 5/5, lungs bilaterally clear GI: soft, protuberant, bsx4 active  Extremities: warm/dry, 2+ BLE pitting edema  Skin: no rashes or lesions  Resolved Hospital Problem list    Assessment & Plan:  69 year old male with metastatic mantle cell lymphoma currently on chemotherapy who presented with N/V and shortness of breath x 2 weeks and found with acute PE. After admission, developed hypertensive emergency with respiratory distress requiring intubation.   Acute hypoxic respiratory failure  At risk for aspiration Tracheobronchomalacia  Multifactorial respiratory failure in setting of distended abd, PE  and hypertensive emergency. -PSV wean with plan for extubation 11/9  -follow intermittent CXR -repeat lasix  -hold sedation for extubation  -pulmonary hygiene post extubation  Left segmental PE  Provoked in the setting of active malignancy.  ECHO without evidence of RV strain.  -continue heparin per pharmacy  -await LE doppler   Fever, Leukopenia  R/o Infectious process - UA negative, no overt infiltrate on CT chest. May be malignancy related -continue cefepime -follow blood cultures -MRSA PCR negative   Hypertensive emergency with chest pain -norvasc per tube  -tele monitoring   Dark non-bloody gastric output -NPO   Mild DKA Hx DMT2 Beta-hydroxybutyric acid elevated at 1.05 -SSI, moderate scale   AKI  AGMA / lactic acidosis Hyperkalemia  -Trend BMP / urinary output -Replace electrolytes as indicated -Avoid nephrotoxic agents, ensure adequate renal perfusion  Pancytopenia  Suspect in setting of chemotherapy  -continue heparin gtt, monitor for bleeding  -transfuse for Hgb <7%   Stage IV mantle cell lymphoma with LUQ mass/ extensive metastatic disease Last CHOP 10/30, follows with Dr. Irene Limbo -appreciate ONC evaluation  -plan is to assess for candidacy for radiation as mass is not responding to chemotherapy  -palliative care consult    Best practice:  Diet: NPO Pain/Anxiety/Delirium protocol (if indicated): PAD protocol  VAP protocol (if indicated): Yes DVT prophylaxis: Systemic heparin GI prophylaxis: PPI Glucose  control: CBG q4h Mobility: BR Code Status: Full code Family Communication: Called daughter - number incorrect in chart. Denman George Olegario Shearer) listed is Equities trader answering service number. Patient updated on plan of care 11/9.  Will plan to discuss further with patient once extubated.  Disposition: ICU  Labs   CBC: Recent Labs  Lab 05/02/2020 0503 05/03/2020 2146 05/07/2020 2315 04/30/20 0430 05/01/20 0500  WBC 4.3  --  2.5* 1.7* 1.9*  NEUTROABS 0.7*  --    --   --   --   HGB 11.5* 10.2* 9.6* 8.3* 7.0*  HCT 35.5* 30.0* 30.1* 25.9* 22.4*  MCV 86.8  --  88.0 88.7 91.4  PLT 100*  --  82* 69* 54*    Basic Metabolic Panel: Recent Labs  Lab 05/13/2020 0503 04/24/2020 2146 04/30/20 0430 04/30/20 1423 05/01/20 0500  NA 139 140 142 143 147*  K 4.8 5.0 5.9* 5.1 4.9  CL 99  --  107 109 110  CO2 23  --  24 23 26   GLUCOSE 290*  --  236* 203* 143*  BUN 40*  --  57* 66* 67*  CREATININE 1.11  --  1.76* 2.10* 1.90*  CALCIUM 10.6*  --  8.2* 7.7* 7.9*   GFR: Estimated Creatinine Clearance: 37.2 mL/min (A) (by C-G formula based on SCr of 1.9 mg/dL (H)). Recent Labs  Lab 05/04/2020 0503 05/07/2020 0606 05/08/2020 2315 04/30/20 0430 04/30/20 0532 05/01/20 0500  WBC 4.3  --  2.5* 1.7*  --  1.9*  LATICACIDVEN 3.2* 2.3*  --   --  1.9  --     Liver Function Tests: Recent Labs  Lab 04/28/2020 0503 04/30/20 0430  AST 19 19  ALT 18 17  ALKPHOS 108 70  BILITOT 0.9 0.4  PROT 5.9* 5.0*  ALBUMIN 3.8 3.0*   No results for input(s): LIPASE, AMYLASE in the last 168 hours. No results for input(s): AMMONIA in the last 168 hours.  ABG    Component Value Date/Time   PHART 7.282 (L) 05/19/2020 2146   PCO2ART 58.2 (H) 05/07/2020 2146   PO2ART 278 (H) 04/24/2020 2146   HCO3 27.3 04/24/2020 2146   TCO2 29 05/14/2020 2146   O2SAT 100.0 05/06/2020 2146     Coagulation Profile: No results for input(s): INR, PROTIME in the last 168 hours.  Cardiac Enzymes: No results for input(s): CKTOTAL, CKMB, CKMBINDEX, TROPONINI in the last 168 hours.  HbA1C: Hgb A1c MFr Bld  Date/Time Value Ref Range Status  04/30/2020 05:49 PM 6.3 (H) 4.8 - 5.6 % Final    Comment:    (NOTE) Pre diabetes:          5.7%-6.4%  Diabetes:              >6.4%  Glycemic control for   <7.0% adults with diabetes   11/14/2019 06:13 AM 5.4 4.8 - 5.6 % Final    Comment:    (NOTE) Pre diabetes:          5.7%-6.4% Diabetes:              >6.4% Glycemic control for   <7.0% adults with  diabetes     CBG: Recent Labs  Lab 04/30/20 1610 04/30/20 1939 04/30/20 2308 05/01/20 0332 05/01/20 0750  GLUCAP 166* 174* 169* 139* 111*     Critical care time: 34 minutes     Noe Gens, MSN, NP-C, AGACNP-BC [facility] 05/01/2020, 8:32 AM   Please see [email or web address] for pager details.
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
## 2020-05-01 NOTE — Progress Notes (Signed)
Pharmacy - IV heparin  Assessment:    Please see note from Leone Haven, PharmD earlier today for full details.  Briefly, 69 y.o. male on IV heparin for PE.    Confirmatory heparin level therapeutic at 0.68 on 1300 units/hr  CBC looking worse d/t recent chemo  No bleeding or line issues per RN  Plan:   Given Plts now down to 54k (typically recommend holding at < 50k) and heparin level borderline elevated, will reduce heparin slightly to 1250 units/hr  Daily heparin level and CBC  Reuel Boom, PharmD, BCPS [phone] 05/01/2020, 3:35 PM

## 2020-05-02 ENCOUNTER — Ambulatory Visit
Admit: 2020-05-02 | Discharge: 2020-05-02 | Disposition: A | Discharge status: Home / Self Care | Payer: Medicare Other | Attending: Radiation Oncology | Admitting: Radiation Oncology

## 2020-05-02 DIAGNOSIS — E883 Tumor lysis syndrome: Secondary | ICD-10-CM

## 2020-05-02 DIAGNOSIS — D61810 Antineoplastic chemotherapy induced pancytopenia: Secondary | ICD-10-CM

## 2020-05-02 DIAGNOSIS — C8310 Mantle cell lymphoma, unspecified site: Principal | ICD-10-CM

## 2020-05-02 DIAGNOSIS — C8318 Mantle cell lymphoma, lymph nodes of multiple sites: Secondary | ICD-10-CM | POA: Diagnosis not present

## 2020-05-02 LAB — PREPARE RBC (CROSSMATCH)

## 2020-05-02 LAB — BLOOD GAS, ARTERIAL
Acid-base deficit: 1.7 mmol/L (ref 0.0–2.0)
Bicarbonate: 21.9 mmol/L (ref 20.0–28.0)
Drawn by: 560031
Expiratory PAP: 5
FIO2: 30
Inspiratory PAP: 13
Mode: POSITIVE
O2 Saturation: 98.7 %
PEEP: 5 cmH2O
Patient temperature: 98.8
RATE: 15 resp/min
pCO2 arterial: 32.9 mmHg (ref 32.0–48.0)
pH, Arterial: 7.438 (ref 7.350–7.450)
pO2, Arterial: 110 mmHg — ABNORMAL HIGH (ref 83.0–108.0)

## 2020-05-02 LAB — GLUCOSE, CAPILLARY
Glucose-Capillary: 141 mg/dL — ABNORMAL HIGH (ref 70–99)
Glucose-Capillary: 145 mg/dL — ABNORMAL HIGH (ref 70–99)
Glucose-Capillary: 154 mg/dL — ABNORMAL HIGH (ref 70–99)
Glucose-Capillary: 173 mg/dL — ABNORMAL HIGH (ref 70–99)
Glucose-Capillary: 199 mg/dL — ABNORMAL HIGH (ref 70–99)
Glucose-Capillary: 222 mg/dL — ABNORMAL HIGH (ref 70–99)
Glucose-Capillary: 233 mg/dL — ABNORMAL HIGH (ref 70–99)

## 2020-05-02 LAB — CBC
HCT: 17.6 % — ABNORMAL LOW (ref 39.0–52.0)
Hemoglobin: 5.5 g/dL — CL (ref 13.0–17.0)
MCH: 28.9 pg (ref 26.0–34.0)
MCHC: 31.3 g/dL (ref 30.0–36.0)
MCV: 92.6 fL (ref 80.0–100.0)
Platelets: 45 10*3/uL — ABNORMAL LOW (ref 150–400)
RBC: 1.9 MIL/uL — ABNORMAL LOW (ref 4.22–5.81)
RDW: 17.7 % — ABNORMAL HIGH (ref 11.5–15.5)
WBC: 1.8 10*3/uL — ABNORMAL LOW (ref 4.0–10.5)
nRBC: 2.2 % — ABNORMAL HIGH (ref 0.0–0.2)

## 2020-05-02 LAB — BASIC METABOLIC PANEL
Anion gap: 15 (ref 5–15)
BUN: 93 mg/dL — ABNORMAL HIGH (ref 8–23)
CO2: 19 mmol/L — ABNORMAL LOW (ref 22–32)
Calcium: 6.9 mg/dL — ABNORMAL LOW (ref 8.9–10.3)
Chloride: 109 mmol/L (ref 98–111)
Creatinine, Ser: 3.1 mg/dL — ABNORMAL HIGH (ref 0.61–1.24)
GFR, Estimated: 21 mL/min — ABNORMAL LOW (ref >60)
Glucose, Bld: 255 mg/dL — ABNORMAL HIGH (ref 70–99)
Potassium: 6.1 mmol/L — ABNORMAL HIGH (ref 3.5–5.1)
Sodium: 143 mmol/L (ref 135–145)

## 2020-05-02 LAB — HEPARIN LEVEL (UNFRACTIONATED)
Heparin Unfractionated: 0.84 IU/mL — ABNORMAL HIGH (ref 0.30–0.70)
Heparin Unfractionated: 0.42 IU/mL (ref 0.30–0.70)

## 2020-05-02 LAB — HEMOGLOBIN AND HEMATOCRIT, BLOOD
HCT: 24 % — ABNORMAL LOW (ref 39.0–52.0)
Hemoglobin: 7.8 g/dL — ABNORMAL LOW (ref 13.0–17.0)

## 2020-05-02 MED ORDER — GERHARDT'S BUTT CREAM
TOPICAL_CREAM | CUTANEOUS | Status: DC | PRN
  Filled 2020-05-02: qty 1

## 2020-05-02 MED ORDER — SODIUM CHLORIDE 0.9% IV SOLUTION: Freq: Once | INTRAVENOUS | Status: DC

## 2020-05-02 MED ORDER — SODIUM CHLORIDE 0.9 % IV SOLN
2.0000 g | INTRAVENOUS | Status: DC
  Administered 2020-05-02: 2 g via INTRAVENOUS
  Filled 2020-05-02: qty 2

## 2020-05-02 MED ORDER — HEPARIN (PORCINE) 25000 UT/250ML-% IV SOLN
1000.0000 [IU]/h | INTRAVENOUS | Status: DC
  Administered 2020-05-02 (×2): 1000 [IU]/h via INTRAVENOUS
  Filled 2020-05-02: qty 250

## 2020-05-02 NOTE — Progress Notes (Signed)
OT Cancellation Note  Patient Details Name: Dean Dixon MRN: [id number] DOB: May 07, 1951   Cancelled Treatment:    Reason Eval/Treat Not Completed: Medical issues which prohibited therapy. Patient with low hemoglobin, on Bipap. Will check back 11/11 as schedule permits.  Delbert Phenix OT OT pager: [phone]   Rosemary Holms 05/02/2020, 9:03 AM

## 2020-05-02 NOTE — Progress Notes (Signed)
Pharmacy Brief Note - Anticoagulation Follow Up:  Pt is a 81 yoM on heparin infusion for PE. Pt has PMH significant for stage IV mantle cell lymphoma s/p chemotherapy on 04/21/20. For full note, see note on 11/10 by Gretta Arab, PharmD.   Assessment:   HL = 0.42 is therapeutic on heparin infusion of 1000 units/hr.   Confirmed with RN that heparin infusing at correct rate. No new or worsening signs of bleeding other than those reported earlier today (see note, MD aware)  CBC: Hgb (7.8) low but increased s/p transfusion  Goal: HL 0.3 - 0.7; could consider targeting lower end of therapeutic range (0.3-0.5)  Plan:  Continue heparin infusion at current rate of 1000 units/hr  Check confirmatory HL in 8 hours  CBC, HL daily  Monitor closely for signs of bleeding  Lenis Noon, PharmD 05/02/20 7:09 PM

## 2020-05-02 NOTE — Evaluation (Addendum)
SLP Cancellation Note  Patient Details Name: Dean Dixon MRN: [id number] DOB: 29-Dec-1950   Cancelled treatment:       Reason Eval/Treat Not Completed: Other (comment);Medical issues which prohibited therapy (pt hypotensive and on Bipap, will continue efforts)  SLP requested RN reach out if pt becomes appropriate for po.  Thanks.   Kathleen Lime, MS [facility] SLP Acute Rehab Services Office [phone] Pager [phone]     Dean Dixon 05/02/2020, 8:26 AM

## 2020-05-02 NOTE — Progress Notes (Addendum)
Yesterday I was able to establish with his nurse who his healthcare power of attorney was, Bernadette Hoit is a friend from high school who lives in [place] who actually has established healthcare power of attorney documented.  His wife [name] is a primary care PA and they have been friends with Dean Dixon for quite some time.  Apparently Mr. Lasseter lives alone, he is divorced and does not have any children, he is very much in optimist they report and want to make sure it is clear the significance and severity of his illness and whether or not he would be able to go back to independently living as he was prior to his admission.  They plan to follow-up with Dr. Irene Limbo as well to discuss his oncologic process and expected plan moving forward after this hospitalization.  I was able to discuss with him the concerns of all the extrinsic compression that his disease from his mantle cell lymphoma is causing in the abdominal area and impacting his vasculature.  We reviewed the rationale for palliative radiotherapy to the abdomen over 10 fractions and that we would like to move forward with this process around the urgently to try and get some relief.  The patient this morning I had gone up to see as well and was not is able to participate in discussion given his BiPAP, significant fatigue and less alertness to conversation.  He rouses to verbal stimuli and can squeeze your hand, he is currently being transfused with blood products given a hemoglobin of 5.5.  When I spoke with his nurse this morning we plan to follow back up this morning to see how he is doing and if he has perked up, consider offering simulation today and treatment tomorrow.  With Nicole Kindred and [name], I discussed the risks, benefits, short and long-term effects of radiotherapy and Nicole Kindred gives verbal consent for [place] to proceed with simulation if he is medically stable to leave the floor today and subsequent treatment.  This note was written into the consent form as  a verbal consent, we will proceed as described provided that he is medically stable to do so and begin treatment tomorrow.   Carola Rhine, [facility]     **Disclaimer: This note was dictated with voice recognition software. Similar sounding words can inadvertently be transcribed and this note may contain transcription errors which may not have been corrected upon publication of note.**

## 2020-05-02 NOTE — Progress Notes (Signed)
Pharmacy Antibiotic Note  Dean Dixon is a 69 y.o. male admitted on 05/15/2020 with sepsis.  Pharmacy has been consulted for Cefepime dosing. D#3 Cefepime SCr increased to 3.1, CrCl ~ 23 ml/min Tm 102.2, WBC low 1.8, ANC 0.7 (11/7)  Plan: Decrease to Cefepime 2g IV q24h Follow up renal function, culture results, and clinical course.   Height: 5\' 4"  (162.6 cm) Weight: 93 kg (205 lb 0.4 oz) IBW/kg (Calculated) : 59.2  Temp (24hrs), Avg:100.7 F (38.2 C), Min:99 F (37.2 C), Max:102.2 F (39 C)  Recent Labs  Lab 05/09/2020 0503 05/10/2020 0606 05/03/2020 2315 04/30/20 0430 04/30/20 0532 04/30/20 1423 05/01/20 0500 05/02/20 0600  WBC 4.3  --  2.5* 1.7*  --   --  1.9* 1.8*  CREATININE 1.11  --   --  1.76*  --  2.10* 1.90* 3.10*  LATICACIDVEN 3.2* 2.3*  --   --  1.9  --   --   --     Estimated Creatinine Clearance: 22.8 mL/min (A) (by C-G formula based on SCr of 3.1 mg/dL (H)).    Allergies  Allergen Reactions  . Ivp Dye [Iodinated Diagnostic Agents] Shortness Of Breath    "allergic to CT scan dye", took benadryl with last scan and tolerated well    Antimicrobials this admission: 11/8 cefepime >>   Dose adjustments this admission:  Microbiology results: 11/9 BCx: sent 11/7 MRSA PCR: negative 11/7 Resp panel: covid neg; influenza A/B neg  Thank you for allowing pharmacy to be a part of this patient's care.  Gretta Arab PharmD, BCPS Clinical Pharmacist [facility] main pharmacy [phone] 05/02/2020 7:57 AM

## 2020-05-02 NOTE — Progress Notes (Signed)
eLink Physician-Brief Progress Note Patient Name: Dean Dixon DOB: September 28, 1950 MRN: [id number]   Date of Service  05/02/2020  HPI/Events of Note  Hemoglobin 5.5 gm  eICU Interventions  Transfuse 2 units PRBC.        Kerry Kass [name] 05/02/2020, 7:07 AM

## 2020-05-02 NOTE — Progress Notes (Signed)
ANTICOAGULATION CONSULT NOTE - Follow Up Consult  Pharmacy Consult for Heparin Indication: pulmonary embolus  Allergies  Allergen Reactions  . Ivp Dye [Iodinated Diagnostic Agents] Shortness Of Breath    "allergic to CT scan dye", took benadryl with last scan and tolerated well    Patient Measurements: Height: 5\' 4"  (162.6 cm) Weight: 93 kg (205 lb 0.4 oz) IBW/kg (Calculated) : 59.2 Heparin Dosing Weight: 81 kg  Vital Signs: Temp: 99 F (37.2 C) (11/10 0700) Temp Source: Bladder (11/10 0600) BP: 127/60 (11/10 0700) Pulse Rate: 103 (11/10 0700)  Labs: Recent Labs    05/02/2020 2315 04/24/2020 2315 04/30/20 0430 04/30/20 0430 04/30/20 1029 04/30/20 1423 04/30/20 1830 05/01/20 0500 05/01/20 1447 05/02/20 0600  HGB 9.6*   < > 8.3*   < >  --   --   --  7.0*  --  5.5*  HCT 30.1*   < > 25.9*  --   --   --   --  22.4*  --  17.6*  PLT 82*   < > 69*  --   --   --   --  54*  --  45*  HEPARINUNFRC 0.27*  --   --   --    < >  --  0.74* 0.64 0.68  --   CREATININE  --    < > 1.76*  --   --  2.10*  --  1.90*  --  3.10*  TROPONINIHS 52*  --   --   --   --   --   --   --   --   --    < > = values in this interval not displayed.    Estimated Creatinine Clearance: 22.8 mL/min (A) (by C-G formula based on SCr of 3.1 mg/dL (H)).   Medications:  Infusions:  . sodium chloride 20 mL/hr at 05/02/20 0718  . ceFEPime (MAXIPIME) IV Stopped (05/02/20 0203)  . heparin 1,250 Units/hr (05/02/20 6195)    Assessment: Patient is a 69 y.o M with stage IV mantle cell lymphoma on chemotherapy (got CHOP on 04/21/20), presented to the ED on 11/7 with c/o SOB, groin swelling and emesis.  Chest CT showed acute PE.  Pharmacy is consulted to start heparin drip for VTE.  Today, 05/02/2020: Heparin level 0.84 (labs drawn late) on heparin at 1250 units/hr CBC:  Hgb decreased to 5.5, Plt decreased to 45k Transfuse 2 units PRBC RN reports very mild urethral bleeding, likely related to catheter placement and  small amount of bright red blood mixed with liquid stool.  MD aware of both.  Goal of Therapy:  Heparin level 0.3-0.7 units/ml Monitor platelets by anticoagulation protocol: Yes   Plan:   Decrease to heparin IV infusion at 1000 units/hr  Heparin level in 8 hours after rate change  Daily heparin level and CBC  Continue to monitor s/s bleeding.   Gretta Arab PharmD, BCPS Clinical Pharmacist [facility] main pharmacy [phone] 05/02/2020 7:26 AM

## 2020-05-02 NOTE — Progress Notes (Signed)
NAME:  Dean Dixon, MRN:  [id number], DOB:  Jan 23, 1951, LOS: 3 ADMISSION DATE:  05/12/2020, CONSULTATION DATE:  11/7/201 REFERRING MD:  TRH, CHIEF COMPLAINT:  Respiratory distress  Brief History   69 year old male with stage IV mantle cell lymphoma on CHOP presenting with N/V and progressive SOB over the last 2 weeks found to have acute left segmental PE.  Admitted to [facility] however developed hypertension 250/100 with respiratory distress with chest pain, unable to tolerate BiPAP and was intubated.     Okay.  [place] History of present illness   HPI obtained from medical chart review as patient is currently intubated and sedated on mechanical ventilation.  69 year old male with prior medical history of stage IV mantle cell lymphoma (diagnosed 06/20/2019) on CHOP, hypertension, diabetes type 2 who presented to [facility] on 11/7 with progressive shortness of breath over the last 2 weeks, worse over the last 2 days with above baseline abdominal distention, non bloody nausea/ vomiting, nonradiating intermittent midsternal chest tightness, and cough with lightly productive greenish nonbloody sputum.  Additionally reported ongoing swelling in his legs and testicles causing difficulty with urination.  Recent hospitalization 10/28 - 11/1 found to have new left upper quadrant abdominal mass and hypercalcemia.    In ER, he was afebrile, hypertensive, mildly tachypneic, and tachycardic with oxygen saturations of 98% on room air.  Noted to have multiple episodes of vomiting and retching treated with zofran.  KUB with non-obstructive gas pattern.   Labs noted for glucose 290 with HgbA1c 6.3, BUN 40, AG 17, lactic acid 3.2-> 2.3, troponin hs 52, BNP 133, D-dimer 2.47, Hgb 11.5/ HCT 35.5, platelets 100, neg COVID/ flu, UA noted turbid urine with 5 ketones, negative for leukocytes/ nitrates.  CXR showed small bilateral pleural effusion with atelectasis and low lung volumes.  He was pretreated given his contrast  allergy. CTA PE and CT abd/ pelvis showed segmental nonocclusive left pulmonary embolism without associated hemorrhage or pulmonary infarct.  Additionally noted bulky soft tissue mass in LUQ with extensive lymphadenopathy in the chest and abd with evidence of widespread intraperitoneal metastatic disease, small bilateral pleural effusion, and  tracheobronchomalacia. Started on heparin gtt per pharmacy.   He was admitted by [facility] to progressive care.  In unit, he was noted to become hypertensive with BP 250/100 with complaints of chest discomfort with noted tachypnea and lethargy.  He was treated with hydralazine and placed on nitro gtt for blood pressure management.  Repeated EKG was non acute.  ABG on NRB noted 7.291/ 56.7/ 275.  Bipap was attempted however patient kept pulling it off and therefore given concern for impending respiratory failure, he was intubated by anesthesia.  PCCM consulted for further management.   Past Medical History  Mantle cell lymphoma on CHOP, DM, HTN  Significant Hospital Events   11/07 Admitted to [facility] decompensated/ intubated 11/08 Weaned on PSV, lasix with 2.2L UOP   Consults:  PCCM 11/7  Procedures:  ETT 11/7 >>  Significant Diagnostic Tests:  11/7 KUB >> nonobstructive gas pattern 11/7 CTA PE/  CT abd/ pelvis >> Study is positive for segmental sized pulmonary emboli in the left lung. These appear nonocclusive at this time, and there is no associated pulmonary hemorrhage to suggest frank pulmonary infarct. Bulky soft tissue mass in the left upper quadrant with extensive lymphadenopathy in the chest and abdomen, and evidence of widespread intraperitoneal metastatic disease. Small bilateral pleural effusions lying dependently. Aortic atherosclerosis.  Tracheobronchomalacia. Cholelithiasis. Colonic diverticulosis. 11/8 [place] Ascites/ABD >> small  volume ascites, insufficient for paracentesis, extensive omental caking and peritoneal nodularity consistent with peritoneal  carcinomatosis  11/8 ECHO >> cystic structure noted in liver on subcostal images, LVEF 60-65%, no RWMA, RV systolic function normal, pericardial effusion is posterior to LV 11/8 LE Doppler >>   Micro Data:  SARS 2/ flu 11/7 >> neg MRSA 11/7 >> neg UA 11/7 >> negative, 5 ketones  Antimicrobials:  Previously on azithro/ ceftriaxone, stopped 10/30 Cefepime 11/8 >>   Interim history/subjective:  T-max 101.8, on BiPAP Very sleepy this morning  Objective   Blood pressure (!) 105/57, pulse (!) 103, temperature 99.1 F (37.3 C), resp. rate (!) 31, height 5\' 4"  (1.626 m), weight 93 kg, SpO2 100 %.    Vent Mode: BIPAP;PCV FiO2 (%):  [30 %-40 %] 30 % Set Rate:  [15 bmp] 15 bmp PEEP:  [5 cmH20] Petrey Pressure:  [19 cmH20] 19 cmH20   Intake/Output Summary (Last 24 hours) at 05/02/2020 0941 Last data filed at 05/02/2020 2549 Gross per 24 hour  Intake 1027.47 ml  Output --  Net 1027.47 ml   Filed Weights   05/08/2020 0357 05/08/2020 1826 04/30/20 0315  Weight: 97.5 kg 94.9 kg 93 kg   Physical Exam: General: Appears comfortable on BiPAP HEENT: Dry oral mucosa Neuro: Arouses to name calling but very weak CV: S1-S2 appreciated PULM: Decreased air movement bilaterally GI: Protuberant, bowel sounds appreciated Extremities: Bilateral lower extremity edema Skin: no rashes or lesions  Resolved Hospital Problem list    Assessment & Plan:  Metastatic mantle cell lymphoma-on chemotherapy Acute pulmonary embolism Extubated 11/9, requiring BiPAP  Acute hypoxic respiratory failure -Continue BiPAP as needed -Diuresis as tolerated -ABG reviewed showing some hypoxemia however looks good  Left segmental pulmonary embolism -Has active malignancy -Continue heparin  Fever, leukopenia -Follow cultures -Continue cefepime  Possible GI bleed -Receiving transfusion  Mild DKA Type 2 diabetes -Continue SSI  Acute kidney injury -BUN/creatinine did bump -Trend  electrolytes -Avoid nephrotoxic medications -Hold diuretics  Pancytopenia -Suspect in the setting of chemotherapy -Heparin GTT -May have to stop heparin if active bleeding  Stage IV mantle cell lymphoma with LUQ mass/ extensive metastatic disease Last CHOP 10/30, follows with Dr. Irene Limbo -appreciate ONC evaluation  -Plan is for radiation treatment when more stable -palliative care consult    Best practice:  Diet: NPO Pain/Anxiety/Delirium protocol (if indicated): PAD protocol  VAP protocol (if indicated): Yes DVT prophylaxis: Systemic heparin GI prophylaxis: PPI Glucose control: CBG q4h Mobility: BR Code Status: Full code Family Communication: Spoke with POA on the phone today  disposition: ICU  Labs   CBC: Recent Labs  Lab 04/30/2020 0503 05/14/2020 0503 05/03/2020 2146 05/04/2020 2315 04/30/20 0430 05/01/20 0500 05/02/20 0600  WBC 4.3  --   --  2.5* 1.7* 1.9* 1.8*  NEUTROABS 0.7*  --   --   --   --   --   --   HGB 11.5*   < > 10.2* 9.6* 8.3* 7.0* 5.5*  HCT 35.5*   < > 30.0* 30.1* 25.9* 22.4* 17.6*  MCV 86.8  --   --  88.0 88.7 91.4 92.6  PLT 100*  --   --  82* 69* 54* 45*   < > = values in this interval not displayed.    Basic Metabolic Panel: Recent Labs  Lab 04/24/2020 0503 05/11/2020 0503 05/20/2020 2146 04/30/20 0430 04/30/20 1423 05/01/20 0500 05/02/20 0600  NA 139   < > 140 142 143 147* 143  K 4.8   < >  5.0 5.9* 5.1 4.9 6.1*  CL 99  --   --  107 109 110 109  CO2 23  --   --  24 23 26  19*  GLUCOSE 290*  --   --  236* 203* 143* 255*  BUN 40*  --   --  57* 66* 67* 93*  CREATININE 1.11  --   --  1.76* 2.10* 1.90* 3.10*  CALCIUM 10.6*  --   --  8.2* 7.7* 7.9* 6.9*   < > = values in this interval not displayed.   GFR: Estimated Creatinine Clearance: 22.8 mL/min (A) (by C-G formula based on SCr of 3.1 mg/dL (H)). Recent Labs  Lab 05/10/2020 0503 05/19/2020 0503 05/15/2020 0606 04/23/2020 2315 04/30/20 0430 04/30/20 0532 05/01/20 0500 05/02/20 0600  WBC 4.3   <  >  --  2.5* 1.7*  --  1.9* 1.8*  LATICACIDVEN 3.2*  --  2.3*  --   --  1.9  --   --    < > = values in this interval not displayed.    Liver Function Tests: Recent Labs  Lab 04/30/2020 0503 04/30/20 0430  AST 19 19  ALT 18 17  ALKPHOS 108 70  BILITOT 0.9 0.4  PROT 5.9* 5.0*  ALBUMIN 3.8 3.0*   No results for input(s): LIPASE, AMYLASE in the last 168 hours. No results for input(s): AMMONIA in the last 168 hours.  ABG    Component Value Date/Time   PHART 7.438 05/02/2020 0755   PCO2ART 32.9 05/02/2020 0755   PO2ART 110 (H) 05/02/2020 0755   HCO3 21.9 05/02/2020 0755   TCO2 29 05/02/2020 2146   ACIDBASEDEF 1.7 05/02/2020 0755   O2SAT 98.7 05/02/2020 0755     Coagulation Profile: No results for input(s): INR, PROTIME in the last 168 hours.  Cardiac Enzymes: No results for input(s): CKTOTAL, CKMB, CKMBINDEX, TROPONINI in the last 168 hours.  HbA1C: Hgb A1c MFr Bld  Date/Time Value Ref Range Status  04/28/2020 05:49 PM 6.3 (H) 4.8 - 5.6 % Final    Comment:    (NOTE) Pre diabetes:          5.7%-6.4%  Diabetes:              >6.4%  Glycemic control for   <7.0% adults with diabetes   11/14/2019 06:13 AM 5.4 4.8 - 5.6 % Final    Comment:    (NOTE) Pre diabetes:          5.7%-6.4% Diabetes:              >6.4% Glycemic control for   <7.0% adults with diabetes     CBG: Recent Labs  Lab 05/01/20 1632 05/01/20 2007 05/02/20 0038 05/02/20 0452 05/02/20 0732  GLUCAP 147* 153* 145* 222* 233*   The patient is critically ill with multiple organ systems failure and requires high complexity decision making for assessment and support, frequent evaluation and titration of therapies, application of advanced monitoring technologies and extensive interpretation of multiple databases. Critical Care Time devoted to patient care services described in this note independent of APP/resident time (if applicable)  is 30 minutes.   Sherrilyn Rist MD [place] [facility] Personal pager: [phone] If unanswered, please page CCM On-call: [phone]

## 2020-05-02 NOTE — Progress Notes (Signed)
E-link notified of patient's AM critical Hgb value. Awaiting orders.

## 2020-05-02 NOTE — Progress Notes (Signed)
RT NOTE:  Pt transitioned to HFNC (salter) at 10L with saturations of 100%. Pt tolerating this well at this time. CCMD aware.

## 2020-05-02 NOTE — Progress Notes (Signed)
PT Cancellation Note  Patient Details Name: Dean Dixon MRN: [id number] DOB: 1950/09/25   Cancelled Treatment:    Reason Eval/Treat Not Completed: Medical issues which prohibited therapy, on BiPAP, low HGB. Will check back tomorrow.   Claretha Cooper 05/02/2020, 8:47 AM [place] Pager [phone] Office [phone]

## 2020-05-03 ENCOUNTER — Ambulatory Visit
Admit: 2020-05-03 | Discharge: 2020-05-03 | Disposition: A | Discharge status: Home / Self Care | Payer: Medicare Other | Attending: Radiation Oncology | Admitting: Radiation Oncology

## 2020-05-03 ENCOUNTER — Inpatient Hospital Stay: Payer: Self-pay

## 2020-05-03 DIAGNOSIS — Z7189 Other specified counseling: Secondary | ICD-10-CM

## 2020-05-03 DIAGNOSIS — R140 Abdominal distension (gaseous): Secondary | ICD-10-CM | POA: Diagnosis not present

## 2020-05-03 DIAGNOSIS — E883 Tumor lysis syndrome: Secondary | ICD-10-CM | POA: Diagnosis not present

## 2020-05-03 DIAGNOSIS — R531 Weakness: Secondary | ICD-10-CM

## 2020-05-03 DIAGNOSIS — Z515 Encounter for palliative care: Secondary | ICD-10-CM | POA: Diagnosis not present

## 2020-05-03 DIAGNOSIS — C8318 Mantle cell lymphoma, lymph nodes of multiple sites: Secondary | ICD-10-CM | POA: Diagnosis not present

## 2020-05-03 DIAGNOSIS — D61810 Antineoplastic chemotherapy induced pancytopenia: Secondary | ICD-10-CM | POA: Diagnosis not present

## 2020-05-03 DIAGNOSIS — I2692 Saddle embolus of pulmonary artery without acute cor pulmonale: Secondary | ICD-10-CM | POA: Diagnosis not present

## 2020-05-03 DIAGNOSIS — Z789 Other specified health status: Secondary | ICD-10-CM | POA: Diagnosis not present

## 2020-05-03 DIAGNOSIS — R0600 Dyspnea, unspecified: Secondary | ICD-10-CM | POA: Diagnosis not present

## 2020-05-03 LAB — CBC
HCT: 22.6 % — ABNORMAL LOW (ref 39.0–52.0)
Hemoglobin: 7.4 g/dL — ABNORMAL LOW (ref 13.0–17.0)
MCH: 29.4 pg (ref 26.0–34.0)
MCHC: 32.7 g/dL (ref 30.0–36.0)
MCV: 89.7 fL (ref 80.0–100.0)
Platelets: 36 10*3/uL — ABNORMAL LOW (ref 150–400)
RBC: 2.52 MIL/uL — ABNORMAL LOW (ref 4.22–5.81)
RDW: 17 % — ABNORMAL HIGH (ref 11.5–15.5)
WBC: 2.4 10*3/uL — ABNORMAL LOW (ref 4.0–10.5)
nRBC: 4.5 % — ABNORMAL HIGH (ref 0.0–0.2)

## 2020-05-03 LAB — DIFFERENTIAL
Abs Immature Granulocytes: 0.18 10*3/uL — ABNORMAL HIGH (ref 0.00–0.07)
Basophils Absolute: 0 10*3/uL (ref 0.0–0.1)
Basophils Relative: 1 %
Eosinophils Absolute: 0 10*3/uL (ref 0.0–0.5)
Eosinophils Relative: 0 %
Immature Granulocytes: 8 %
Lymphocytes Relative: 24 %
Lymphs Abs: 0.5 10*3/uL — ABNORMAL LOW (ref 0.7–4.0)
Monocytes Absolute: 0.4 10*3/uL (ref 0.1–1.0)
Monocytes Relative: 16 %
Neutro Abs: 1.1 10*3/uL — ABNORMAL LOW (ref 1.7–7.7)
Neutrophils Relative %: 51 %

## 2020-05-03 LAB — COMPREHENSIVE METABOLIC PANEL
ALT: 20 U/L (ref 0–44)
AST: 35 U/L (ref 15–41)
Albumin: 2.8 g/dL — ABNORMAL LOW (ref 3.5–5.0)
Alkaline Phosphatase: 57 U/L (ref 38–126)
Anion gap: 14 (ref 5–15)
BUN: 99 mg/dL — ABNORMAL HIGH (ref 8–23)
CO2: 22 mmol/L (ref 22–32)
Calcium: 7 mg/dL — ABNORMAL LOW (ref 8.9–10.3)
Chloride: 120 mmol/L — ABNORMAL HIGH (ref 98–111)
Creatinine, Ser: 2.1 mg/dL — ABNORMAL HIGH (ref 0.61–1.24)
GFR, Estimated: 33 mL/min — ABNORMAL LOW (ref >60)
Glucose, Bld: 144 mg/dL — ABNORMAL HIGH (ref 70–99)
Potassium: 4.1 mmol/L (ref 3.5–5.1)
Sodium: 156 mmol/L — ABNORMAL HIGH (ref 135–145)
Total Bilirubin: 0.8 mg/dL (ref 0.3–1.2)
Total Protein: 5.7 g/dL — ABNORMAL LOW (ref 6.5–8.1)

## 2020-05-03 LAB — GLUCOSE, CAPILLARY
Glucose-Capillary: 150 mg/dL — ABNORMAL HIGH (ref 70–99)
Glucose-Capillary: 135 mg/dL — ABNORMAL HIGH (ref 70–99)
Glucose-Capillary: 140 mg/dL — ABNORMAL HIGH (ref 70–99)
Glucose-Capillary: 163 mg/dL — ABNORMAL HIGH (ref 70–99)
Glucose-Capillary: 133 mg/dL — ABNORMAL HIGH (ref 70–99)
Glucose-Capillary: 146 mg/dL — ABNORMAL HIGH (ref 70–99)

## 2020-05-03 LAB — URIC ACID: Uric Acid, Serum: 15.4 mg/dL — ABNORMAL HIGH (ref 3.7–8.6)

## 2020-05-03 LAB — HEPARIN LEVEL (UNFRACTIONATED)
Heparin Unfractionated: 0.19 IU/mL — ABNORMAL LOW (ref 0.30–0.70)
Heparin Unfractionated: 0.35 IU/mL (ref 0.30–0.70)
Heparin Unfractionated: 0.32 IU/mL (ref 0.30–0.70)

## 2020-05-03 LAB — PREPARE RBC (CROSSMATCH)

## 2020-05-03 MED ORDER — SODIUM CHLORIDE 0.9 % IV SOLN
2.0000 g | Freq: Two times a day (BID) | INTRAVENOUS | Status: AC
  Administered 2020-05-03 – 2020-05-06 (×7): 2 g via INTRAVENOUS
  Filled 2020-05-03 (×7): qty 2

## 2020-05-03 MED ORDER — HEPARIN (PORCINE) 25000 UT/250ML-% IV SOLN: 1250.0000 [IU]/h | INTRAVENOUS | Status: DC

## 2020-05-03 MED ORDER — ALLOPURINOL 100 MG PO TABS
100.0000 mg | ORAL_TABLET | Freq: Two times a day (BID) | ORAL | Status: DC
  Administered 2020-05-04 – 2020-05-12 (×16): 100 mg via ORAL
  Filled 2020-05-03 (×17): qty 1

## 2020-05-03 MED ORDER — SODIUM CHLORIDE 0.9 % IV SOLN
6.0000 mg | Freq: Once | INTRAVENOUS | Status: AC
  Administered 2020-05-03: 6 mg via INTRAVENOUS
  Filled 2020-05-03: qty 4

## 2020-05-03 MED ORDER — HEPARIN (PORCINE) 25000 UT/250ML-% IV SOLN
1100.0000 [IU]/h | INTRAVENOUS | Status: DC
  Administered 2020-05-03 – 2020-05-04 (×2): 1250 [IU]/h via INTRAVENOUS
  Administered 2020-05-06: 1100 [IU]/h via INTRAVENOUS
  Filled 2020-05-03 (×5): qty 250

## 2020-05-03 MED ORDER — SODIUM CHLORIDE 0.9% IV SOLUTION: Freq: Once | INTRAVENOUS | Status: DC

## 2020-05-03 NOTE — Progress Notes (Signed)
Pt. seen for Servo-I PRN BiPAP, currently on room air 96% sats, easily awoken to voice, RN made aware, staff to monitor.

## 2020-05-03 NOTE — Progress Notes (Signed)
Pharmacy - IV heparin  Assessment:    Please see note from Peggyann Juba, PharmD earlier today for full details.  Briefly, 69 y.o. male on IV heparin for PE.   Most recent heparin level remains therapeutic at 0.32 (low end of range) on 1250 units/hr  CBC notably worse after recent chemo - Hgb down to 7.4 and Plt to 36k; receiving PRBC but no Plt unless < 30k  No bleeding or infusion issues per RN; prior hematuria/hemoatochezia appears to have resolved  Plan:   Continue heparin infusion at 1250 units/hr  Daily CBC - f/u plt closely   Reuel Boom, PharmD, BCPS [phone] 05/03/2020, 2:33 PM

## 2020-05-03 NOTE — Progress Notes (Addendum)
HEMATOLOGY-ONCOLOGY PROGRESS NOTE  SUBJECTIVE:  The patient is more awake.  Off oxygen.  His HCPOA, Dean Dixon, is at the bedside.  He denies abdominal pain, nausea, vomiting.  No breathing difficulty. No bleeding reported.  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 ([place])   06/29/2019 - 10/20/2019 Chemotherapy   The patient had dexamethasone (DECADRON) 4 MG tablet, 8 mg, Oral, Daily, 1 of 1 cycle, Start date: 06/20/2019, End date: 11/17/2019 palonosetron (ALOXI) injection 0.25 mg, 0.25 mg, Intravenous,  Once, 5 of 5 cycles Administration: 0.25 mg (07/27/2019), 0.25 mg (06/30/2019), 0.25 mg (08/24/2019), 0.25 mg (10/20/2019), 0.25 mg (09/21/2019) pegfilgrastim (NEULASTA ONPRO KIT) injection 6 mg, 6 mg, Subcutaneous, Once, 3 of 3 cycles Administration: 6 mg (08/25/2019), 6 mg (09/22/2019) bendamustine (BENDEKA) 200 mg in sodium chloride 0.9 % 50 mL (3.4483 mg/mL) chemo infusion, 90 mg/m2 = 200 mg, Intravenous,  Once, 5 of 5 cycles Administration: 200 mg (06/29/2019), 200 mg (06/30/2019), 200 mg (07/27/2019), 200 mg (07/28/2019), 200 mg (08/24/2019), 200 mg (08/25/2019), 200 mg (09/21/2019), 200 mg (09/22/2019) riTUXimab-pvvr (RUXIENCE) 800 mg in sodium chloride 0.9 % 250 mL (2.4242 mg/mL) infusion, 375 mg/m2 = 800 mg, Intravenous,  Once, 5 of 5 cycles Dose modification: 375 mg/m2 (original dose 375 mg/m2, Cycle 2), 400 mg (original dose 375 mg/m2, Cycle 2, Reason: Other (see comments), Comment: finishing rituximab from previous day, infusion not completed), 300 mg (original dose 300 mg, Cycle 4) Administration: 800 mg (06/29/2019), 800 mg (07/27/2019), 800 mg (08/24/2019), 400 mg (07/28/2019), 800 mg (10/20/2019), 800 mg (09/21/2019), 300 mg (09/22/2019)  for chemotherapy treatment.    04/20/2020 -  Chemotherapy   The patient had DOXOrubicin (ADRIAMYCIN) chemo injection 100 mg, 48 mg/m2 = 104 mg, Intravenous,  Once, 1 of 8 cycles Administration: 100  mg (04/21/2020) palonosetron (ALOXI) injection 0.25 mg, 0.25 mg, Intravenous,  Once, 1 of 8 cycles Administration: 0.25 mg (04/21/2020) pegfilgrastim-cbqv (UDENYCA) injection 6 mg, 6 mg, Subcutaneous, Once, 1 of 1 cycle Administration: 6 mg (04/24/2020) vinCRIStine (ONCOVIN) 2 mg in sodium chloride 0.9 % 50 mL chemo infusion, 2 mg, Intravenous,  Once, 1 of 8 cycles Administration: 2 mg (04/21/2020) cyclophosphamide (CYTOXAN) 1,560 mg in sodium chloride 0.9 % 250 mL chemo infusion, 750 mg/m2 = 1,560 mg, Intravenous,  Once, 1 of 8 cycles Administration: 1,560 mg (04/21/2020) fosaprepitant (EMEND) 150 mg in sodium chloride 0.9 % 145 mL IVPB, 150 mg, Intravenous,  Once, 1 of 8 cycles Administration: 150 mg (04/21/2020)  for chemotherapy treatment.       REVIEW OF SYSTEMS:   Negative except as noted in the HPI.  PHYSICAL EXAMINATION: ECOG PERFORMANCE STATUS: 2 - Symptomatic, <50% confined to bed  Vitals:   05/03/20 1348 05/03/20 1350  BP:  (!) 153/62  Pulse: 97   Resp: (!) 23   Temp: 98.7 F (37.1 C)   SpO2: 95%    Filed Weights   05/01/2020 1826 04/30/20 0315 05/03/20 0700  Weight: 94.9 kg 93 kg 93.2 kg    Intake/Output from previous day: 11/10 0701 - 11/11 0700 In: 1829.3 [I.V.:1012.3; Blood:651.8; IV Piggyback:165.3] Out: 2605 [Urine:2575; Stool:30]  GENERAL: Awake and alert, no distress SKIN: skin color, texture, turgor are normal, no rashes or significant lesions EYES: normal, Conjunctiva are pink and non-injected, sclera clear LUNGS: Clear HEART: regular rate & rhythm and no murmurs, 1+ bilateral lower extremity edema ABDOMEN: Significant abdominal distention NEURO: Alert and oriented x3.  LABORATORY DATA:  I have reviewed the data as listed CMP Latest Ref Rng & Units 05/03/2020 05/02/2020 05/01/2020  Glucose 70 - 99 mg/dL 144(H) 255(H) 143(H)  BUN 8 - 23 mg/dL 99(H) 93(H) 67(H)  Creatinine 0.61 - 1.24 mg/dL 2.10(H) 3.10(H) 1.90(H)  Sodium 135 - 145 mmol/L 156(H)  143 147(H)  Potassium 3.5 - 5.1 mmol/L 4.1 6.1(H) 4.9  Chloride 98 - 111 mmol/L 120(H) 109 110  CO2 22 - 32 mmol/L 22 19(L) 26  Calcium 8.9 - 10.3 mg/dL 7.0(L) 6.9(L) 7.9(L)  Total Protein 6.5 - 8.1 g/dL 5.7(L) - -  Total Bilirubin 0.3 - 1.2 mg/dL 0.8 - -  Alkaline Phos 38 - 126 U/L 57 - -  AST 15 - 41 U/L 35 - -  ALT 0 - 44 U/L 20 - -   . CBC Latest Ref Rng & Units 05/03/2020 05/02/2020 05/02/2020  WBC 4.0 - 10.5 K/uL 2.4(L) - 1.8(L)  Hemoglobin 13.0 - 17.0 g/dL 7.4(L) 7.8(L) 5.5(LL)  Hematocrit 39 - 52 % 22.6(L) 24.0(L) 17.6(L)  Platelets 150 - 400 K/uL 36(L) - 45(L)    Lab Results  Component Value Date   WBC 2.4 (L) 05/03/2020   HGB 7.4 (L) 05/03/2020   HCT 22.6 (L) 05/03/2020   MCV 89.7 05/03/2020   PLT 36 (L) 05/03/2020   NEUTROABS 1.1 (L) 05/03/2020    CT ABDOMEN PELVIS WO CONTRAST  Addendum Date: 04/19/2020   ADDENDUM REPORT: 04/19/2020 12:54 ADDENDUM: The peritoneal fluid within the pelvis is not enhancing as this is a non IV contrast imaging exam. Therefore would described fluid collection as having thickened rim. Favor peritoneal metastasis. Findings conveyed toSCOTT Dixon on 04/19/2020  at12:54. Electronically Signed   By: Dean Dixon M.D.   On: 04/19/2020 12:54   Result Date: 04/19/2020 CLINICAL DATA:  Abdominal pain.  Tachycardia.  Mantle cell lymphoma EXAM: CT ABDOMEN AND PELVIS WITHOUT CONTRAST TECHNIQUE: Multidetector CT imaging of the abdomen and pelvis was performed following the standard protocol without IV contrast. COMPARISON:  PET-CT scan 01/02/2020 FINDINGS: Lower chest: New small RIGHT effusion. No pericardial effusion. There is new precordial adenopathy anterior to the RIGHT heart and liver (image 9/2). Hepatobiliary: New intraperitoneal free fluid along the margin of the RIGHT hepatic lobe. This fluid is simple attenuation. The liver appears unchanged on noncontrast exam. Large cyst the LEFT hepatic lobe. Gallbladder normal. Pancreas: Pancreas is  grossly normal noncontrast exam Spleen: . spleen is unchanged Adrenals/urinary tract: Adrenal glands kidneys and ureters normal. Bladder Stomach/Bowel: Stomach duodenum normal. There is a new mass lesion in the LEFT upper quadrant measuring 14.2 by 13.0 cm. The mass may be associated the small bowel or the small bowel mesentery. The descending colon is also involving the mass (image 43/2. There is extensive fluid within the leaves of the mesentery of the distal small bowel. Appendix normal. Ascending transverse colon normal. The descending colon tracks along the new LEFT upper quadrant mass. Vascular/Lymphatic: New large LEFT upper quadrant mass either represents lymphoma involving the small bowel or mesentery. Mass is very large described the stomach section. Additionally there is new periportal adenopathy with enlarged lymph nodes along the aorta measuring up to 2 cm (image 44/2. There is nodularity along the greater omentum abdominal on the LEFT. Reproductive: Prostate unremarkable Other: Small volume free fluid the pelvis. There is enhancing rim to the fluid (image 73/2 which could indicate peritonitis. Musculoskeletal: No aggressive osseous lesion. IMPRESSION: 1. Dominant finding is new large (15 cm) lymphoid mass within LEFT upper quadrant. Mass is  new from PET-CT 01/02/2020. Differential includes massive mesenteric adenopathy versus lymphoma of the small bowel or descending colon. No oral or IV contrast. 2. New periaortic retroperitoneal lymphadenopathy. New precordial adenopathy. 3. Extensive fluid within the mesentery and omental thickening in LEFT lower quadrant. 4. Free fluid the pelvis with thin enhancing rim suggest peritonitis. 5. No evidence of bowel perforation. No evidence of bowel obstruction. Electronically Signed: By: Dean Dixon M.D. On: 04/19/2020 12:39   DG Chest 1 View  Result Date: 05/06/2020 CLINICAL DATA:  Elevated blood pressure. EXAM: CHEST  1 VIEW COMPARISON:  April 29, 2020  FINDINGS: The lung volumes are low. There are small bilateral pleural effusions with adjacent atelectasis. The heart size is mildly enlarged. The lung volumes are low. There is no pneumothorax. IMPRESSION: 1. Low lung volumes. 2. Small bilateral pleural effusions with adjacent atelectasis. Electronically Signed   By: Constance Holster M.D.   On: 04/24/2020 20:42   DG Chest 2 View  Result Date: 04/23/2020 CLINICAL DATA:  Shortness of breath lasting several days. Groin swelling and redness. EXAM: CHEST - 2 VIEW COMPARISON:  Chest CT 04/19/20 FINDINGS: Stable cardiomediastinal contours. Low lung volumes. Unchanged small bilateral pleural effusions. No airspace densities. IMPRESSION: Low lung volumes with persistent small bilateral pleural effusions. Electronically Signed   By: Kerby Moors M.D.   On: 05/06/2020 05:09   DG Abd 1 View  Result Date: 04/27/2020 CLINICAL DATA:  Tube placement EXAM: ABDOMEN - 1 VIEW COMPARISON:  April 29, 2020 FINDINGS: The enteric tube projects over the gastric antrum/pylorus. The tube is pointed distally. The visualized bowel gas pattern is unremarkable. IMPRESSION: The enteric tube projects over the gastric antrum/pylorus. Electronically Signed   By: Constance Holster M.D.   On: 05/20/2020 22:05   CT CHEST WO CONTRAST  Result Date: 04/19/2020 CLINICAL DATA:  Pneumonia. Pleural effusion. Concern for metastases. EXAM: CT CHEST WITHOUT CONTRAST TECHNIQUE: Multidetector CT imaging of the chest was performed following the standard protocol without IV contrast. COMPARISON:  CT dated Nov 14, 2019 FINDINGS: Cardiovascular: The heart size is stable. Coronary artery calcifications are noted. There is no significant pericardial effusion. Mediastinum/Nodes: -- No mediastinal lymphadenopathy. -- No hilar lymphadenopathy. --there is left axillary adenopathy. This has progressed since the prior study. There is mild right axillary adenopathy, progressed from prior study. -- No  supraclavicular lymphadenopathy. -- Normal thyroid gland where visualized. -there is mild diffuse esophageal wall thickening. Lungs/Pleura: There are trace to small bilateral pleural effusions, right greater than left. There is atelectasis at the right lung base. There is no pneumothorax. No area of consolidation concerning for pneumonia. Upper Abdomen: There is a partially visualized mass in the left upper quadrant, better visualized on the patient's prior CT. There is free fluid in the upper abdomen. Multiple enlarged pericardiophrenic lymph nodes are noted. There appears to be retroperitoneal adenopathy. Musculoskeletal: No chest wall abnormality. No bony spinal canal stenosis. IMPRESSION: 1. Trace to small bilateral pleural effusions, right greater than left. There is atelectasis at the right lung base. No area of consolidation concerning for pneumonia. 2. Mild diffuse esophageal wall thickening. Correlation with patient's symptoms is recommended. 3. Worsening axillary adenopathy. 4. Partially visualized mass in the left upper quadrant, better visualized on the patient's prior CT. 5. Small volume ascites. Aortic Atherosclerosis (ICD10-I70.0). Electronically Signed   By: Constance Holster M.D.   On: 04/19/2020 20:37   CT Angio Chest PE W and/or Wo Contrast  Result Date: 05/03/2020 CLINICAL DATA:  69 year old male with history of  shortness of breath for the past few days. Abdominal distension. History of mantle cell lymphoma. EXAM: CT ANGIOGRAPHY CHEST CT ABDOMEN AND PELVIS WITH CONTRAST TECHNIQUE: Multidetector CT imaging of the chest was performed using the standard protocol during bolus administration of intravenous contrast. Multiplanar CT image reconstructions and MIPs were obtained to evaluate the vascular anatomy. Multidetector CT imaging of the abdomen and pelvis was performed using the standard protocol during bolus administration of intravenous contrast. CONTRAST:  140mL OMNIPAQUE IOHEXOL 350 MG/ML  SOLN COMPARISON:  CT the abdomen and pelvis 04/19/2020. CT the chest 04/19/2020. FINDINGS: CTA CHEST FINDINGS Cardiovascular: Study is limited by patient respiratory motion. However, despite this limitation there are segmental sized filling defects within pulmonary arteries to the left upper and lower lobes best appreciated on axial images 73 and 106 of series 5, and axial image 148 of series 5 respectively, compatible with pulmonary embolism. Heart size is normal. There is no significant pericardial fluid, thickening or pericardial calcification. Aortic atherosclerosis. No definite coronary artery calcifications. Mediastinum/Nodes: Multiple enlarged anterior mediastinal lymph nodes noted inferiorly, immediately above the right hemidiaphragm, measuring up to 2.3 cm in short axis (axial image 13 of series 11), increased compared to the prior study. Prominent but nonenlarged distal paraesophageal lymph node measuring 8 mm in short axis, nonspecific. Circumferential thickening of the distal third of the esophagus. Mildly enlarged left axillary lymph nodes measuring up to 1.3 cm in short axis. Lungs/Pleura: Collapse of the trachea and mainstem bronchi during expiration, indicative of tracheobronchomalacia. No acute consolidative airspace disease. Small bilateral pleural effusions lying dependently. No definite suspicious appearing pulmonary nodules or masses are noted. Musculoskeletal: There are no aggressive appearing lytic or blastic lesions noted in the visualized portions of the skeleton. Review of the MIP images confirms the above findings. CT ABDOMEN and PELVIS FINDINGS Hepatobiliary: Multiple well-defined low-attenuation lesions are again noted throughout the liver, similar in size, number and distribution to the prior study, largest of which are compatible with simple cysts measuring up to 5.3 x 3.7 cm in segment 2 and for a (axial image 18 of series 11). Smaller lesions are too small to definitively  characterize, but also favored to represent small cysts or biliary hamartomas. No new suspicious hepatic lesions. No intra or extrahepatic biliary ductal dilatation. 6 mm calcified gallstone lying dependently in the gallbladder. Gallbladder is not distended. Pancreas: No pancreatic mass. No pancreatic ductal dilatation. No pancreatic or peripancreatic fluid collections or inflammatory changes. Spleen: Unremarkable. Adrenals/Urinary Tract: 1.2 cm low-attenuation lesion in the upper pole of the right kidney and exophytic 2.4 cm low-attenuation lesion in the upper pole of the left kidney, compatible with simple cysts. Other subcentimeter low-attenuation lesions in both kidneys, too small to definitively characterize, but favored to represent tiny cysts. No hydroureteronephrosis. Urinary bladder is nearly decompressed, but otherwise unremarkable in appearance. Stomach/Bowel: Stomach is unremarkable in appearance. No pathologic dilatation of small bowel or colon. Multiple bowel loops in the left upper quadrant are intimately associated with the large soft tissue mass (discussed below). Colonic diverticulosis, particularly in the sigmoid colon, without definitive evidence to suggest an acute diverticulitis at this time. Vascular/Lymphatic: Aortic atherosclerosis, without evidence of aneurysm or dissection in the abdominal or pelvic vasculature. Extensive lymphadenopathy noted in the abdomen, most evident in the retroperitoneum where there are bulky para-aortic nodal masses measuring up to 2.3 x 4.1 cm near the left renal artery (axial image 41 of series 11), 3.2 x 4.6 cm adjacent to the infrarenal abdominal aorta (axial image 48 of  series 11), and 3.4 x 3.2 cm adjacent to the aortic bifurcation (axial image 54 of series 11), all of which appear similar to the recent prior study. Bulky celiac axis and gastrohepatic ligament lymph nodes are noted, measuring up to 1.9 cm in short axis (axial image 24 of series 11). Several  prominent but nonenlarged pelvic lymph nodes are noted (nonspecific). Reproductive: Prostate gland and seminal vesicles are unremarkable in appearance. Other: Bulky poorly defined soft tissue mass in the left upper quadrant of the abdomen which is intimately associated with multiple adjacent bowel loops, and difficult to distinctly defined, but estimated to measure approximately 15.5 x 13.5 x 13.4 cm (axial image 38 of series 11 and coronal image 53 of series 14). Moderate volume of ascites. Diffuse enhancement of the peritoneal membranes, indicative of widespread peritoneal metastatic disease. Extensive omental caking, best appreciated in the left side of the abdomen where the bulkiest portion of this measures approximately 14.9 x 4.0 cm (axial image 47 of series 11). No pneumoperitoneum. Musculoskeletal: There are no aggressive appearing lytic or blastic lesions noted in the visualized portions of the skeleton. Review of the MIP images confirms the above findings. IMPRESSION: 1. Study is positive for segmental sized pulmonary emboli in the left lung. These appear nonocclusive at this time, and there is no associated pulmonary hemorrhage to suggest frank pulmonary infarct. 2. Bulky soft tissue mass in the left upper quadrant with extensive lymphadenopathy in the chest and abdomen, and evidence of widespread intraperitoneal metastatic disease, as detailed above. 3. Small bilateral pleural effusions lying dependently. 4. Aortic atherosclerosis. 5. Tracheobronchomalacia. 6. Cholelithiasis. 7. Colonic diverticulosis. Electronically Signed   By: Vinnie Langton M.D.   On: 05/06/2020 12:50   CT Abdomen Pelvis W Contrast  Result Date: 05/06/2020 CLINICAL DATA:  69 year old male with history of shortness of breath for the past few days. Abdominal distension. History of mantle cell lymphoma. EXAM: CT ANGIOGRAPHY CHEST CT ABDOMEN AND PELVIS WITH CONTRAST TECHNIQUE: Multidetector CT imaging of the chest was performed  using the standard protocol during bolus administration of intravenous contrast. Multiplanar CT image reconstructions and MIPs were obtained to evaluate the vascular anatomy. Multidetector CT imaging of the abdomen and pelvis was performed using the standard protocol during bolus administration of intravenous contrast. CONTRAST:  115mL OMNIPAQUE IOHEXOL 350 MG/ML SOLN COMPARISON:  CT the abdomen and pelvis 04/19/2020. CT the chest 04/19/2020. FINDINGS: CTA CHEST FINDINGS Cardiovascular: Study is limited by patient respiratory motion. However, despite this limitation there are segmental sized filling defects within pulmonary arteries to the left upper and lower lobes best appreciated on axial images 73 and 106 of series 5, and axial image 148 of series 5 respectively, compatible with pulmonary embolism. Heart size is normal. There is no significant pericardial fluid, thickening or pericardial calcification. Aortic atherosclerosis. No definite coronary artery calcifications. Mediastinum/Nodes: Multiple enlarged anterior mediastinal lymph nodes noted inferiorly, immediately above the right hemidiaphragm, measuring up to 2.3 cm in short axis (axial image 13 of series 11), increased compared to the prior study. Prominent but nonenlarged distal paraesophageal lymph node measuring 8 mm in short axis, nonspecific. Circumferential thickening of the distal third of the esophagus. Mildly enlarged left axillary lymph nodes measuring up to 1.3 cm in short axis. Lungs/Pleura: Collapse of the trachea and mainstem bronchi during expiration, indicative of tracheobronchomalacia. No acute consolidative airspace disease. Small bilateral pleural effusions lying dependently. No definite suspicious appearing pulmonary nodules or masses are noted. Musculoskeletal: There are no aggressive appearing lytic or blastic lesions  noted in the visualized portions of the skeleton. Review of the MIP images confirms the above findings. CT ABDOMEN and  PELVIS FINDINGS Hepatobiliary: Multiple well-defined low-attenuation lesions are again noted throughout the liver, similar in size, number and distribution to the prior study, largest of which are compatible with simple cysts measuring up to 5.3 x 3.7 cm in segment 2 and for a (axial image 18 of series 11). Smaller lesions are too small to definitively characterize, but also favored to represent small cysts or biliary hamartomas. No new suspicious hepatic lesions. No intra or extrahepatic biliary ductal dilatation. 6 mm calcified gallstone lying dependently in the gallbladder. Gallbladder is not distended. Pancreas: No pancreatic mass. No pancreatic ductal dilatation. No pancreatic or peripancreatic fluid collections or inflammatory changes. Spleen: Unremarkable. Adrenals/Urinary Tract: 1.2 cm low-attenuation lesion in the upper pole of the right kidney and exophytic 2.4 cm low-attenuation lesion in the upper pole of the left kidney, compatible with simple cysts. Other subcentimeter low-attenuation lesions in both kidneys, too small to definitively characterize, but favored to represent tiny cysts. No hydroureteronephrosis. Urinary bladder is nearly decompressed, but otherwise unremarkable in appearance. Stomach/Bowel: Stomach is unremarkable in appearance. No pathologic dilatation of small bowel or colon. Multiple bowel loops in the left upper quadrant are intimately associated with the large soft tissue mass (discussed below). Colonic diverticulosis, particularly in the sigmoid colon, without definitive evidence to suggest an acute diverticulitis at this time. Vascular/Lymphatic: Aortic atherosclerosis, without evidence of aneurysm or dissection in the abdominal or pelvic vasculature. Extensive lymphadenopathy noted in the abdomen, most evident in the retroperitoneum where there are bulky para-aortic nodal masses measuring up to 2.3 x 4.1 cm near the left renal artery (axial image 41 of series 11), 3.2 x 4.6 cm  adjacent to the infrarenal abdominal aorta (axial image 48 of series 11), and 3.4 x 3.2 cm adjacent to the aortic bifurcation (axial image 54 of series 11), all of which appear similar to the recent prior study. Bulky celiac axis and gastrohepatic ligament lymph nodes are noted, measuring up to 1.9 cm in short axis (axial image 24 of series 11). Several prominent but nonenlarged pelvic lymph nodes are noted (nonspecific). Reproductive: Prostate gland and seminal vesicles are unremarkable in appearance. Other: Bulky poorly defined soft tissue mass in the left upper quadrant of the abdomen which is intimately associated with multiple adjacent bowel loops, and difficult to distinctly defined, but estimated to measure approximately 15.5 x 13.5 x 13.4 cm (axial image 38 of series 11 and coronal image 53 of series 14). Moderate volume of ascites. Diffuse enhancement of the peritoneal membranes, indicative of widespread peritoneal metastatic disease. Extensive omental caking, best appreciated in the left side of the abdomen where the bulkiest portion of this measures approximately 14.9 x 4.0 cm (axial image 47 of series 11). No pneumoperitoneum. Musculoskeletal: There are no aggressive appearing lytic or blastic lesions noted in the visualized portions of the skeleton. Review of the MIP images confirms the above findings. IMPRESSION: 1. Study is positive for segmental sized pulmonary emboli in the left lung. These appear nonocclusive at this time, and there is no associated pulmonary hemorrhage to suggest frank pulmonary infarct. 2. Bulky soft tissue mass in the left upper quadrant with extensive lymphadenopathy in the chest and abdomen, and evidence of widespread intraperitoneal metastatic disease, as detailed above. 3. Small bilateral pleural effusions lying dependently. 4. Aortic atherosclerosis. 5. Tracheobronchomalacia. 6. Cholelithiasis. 7. Colonic diverticulosis. Electronically Signed   By: Mauri Brooklyn.D.  On: 04/25/2020 12:50   DG CHEST PORT 1 VIEW  Result Date: 04/30/2020 CLINICAL DATA:  Central line placement EXAM: PORTABLE CHEST 1 VIEW COMPARISON:  Radiograph 04/30/2020 FINDINGS: Endotracheal tube tip terminates 2.2 cm from the expected location of the carina. Transesophageal tube tip and side port distal to the GE junction. Right IJ catheter tip terminates in the right atrium. Telemetry leads overlie the chest. Layering bilateral effusions adjacent passive atelectasis. Additional bandlike subsegmental atelectasis in the lungs as well. Stable borderline cardiomegaly accounting for portable technique. No acute osseous or soft tissue abnormality. Degenerative changes are present in the imaged spine and shoulders. IMPRESSION: 1. Endotracheal tube tip terminates 2.2 cm from the expected location of the carina. Could consider retraction 2 cm to the mid trachea. 2. Transesophageal tube tip and side port distal to the GE junction. 3. Right IJ catheter tip terminates in the right atrium. 4. Layering bilateral effusions with adjacent passive atelectasis and vascular congestion. 5. Stable borderline cardiomegaly. Electronically Signed   By: Lovena Le M.D.   On: 04/30/2020 01:14   DG CHEST PORT 1 VIEW  Result Date: 05/15/2020 CLINICAL DATA:  Endotracheal tube placement EXAM: PORTABLE CHEST 1 VIEW COMPARISON:  April 29, 2020 FINDINGS: The endotracheal tube terminates above the carina. The lung volumes are low. Again identified are bilateral pleural effusions with adjacent atelectasis. The heart size is stable. There is no pneumothorax. The enteric tube terminates below the left hemidiaphragm. IMPRESSION: Lines and tubes as above.  Otherwise, stable exam. Electronically Signed   By: Constance Holster M.D.   On: 05/20/2020 22:04   DG Chest Portable 1 View  Result Date: 04/19/2020 CLINICAL DATA:  Cough, wheezing EXAM: PORTABLE CHEST 1 VIEW COMPARISON:  01/09/2020 FINDINGS: Low lung volumes. Minimal atelectasis  or scarring at the left lung base. No pleural effusion or pneumothorax. Heart size is within normal limits for technique. IMPRESSION: Minimal atelectasis or scarring at the left lung base. Electronically Signed   By: Macy Mis M.D.   On: 04/19/2020 12:18   DG Abd Portable 2 Views  Result Date: 05/20/2020 CLINICAL DATA:  Abdominal distension and pain. EXAM: PORTABLE ABDOMEN - 2 VIEW COMPARISON:  04/19/2020 FINDINGS: There is mild gaseous distension of the stomach. No dilated loops of large or small bowel. There is no evidence of free air. No radio-opaque calculi or other significant radiographic abnormality is seen. IMPRESSION: Nonobstructive bowel gas pattern. Electronically Signed   By: Kerby Moors M.D.   On: 04/26/2020 06:12   ECHOCARDIOGRAM COMPLETE  Result Date: 04/20/2020    ECHOCARDIOGRAM REPORT   Patient Name:   Dean Dixon Date of Exam: 04/20/2020 Medical Rec #:  [id number]    Height:       64.0 in Accession #:    [id number]   Weight:       210.1 lb Date of Birth:  [birth year]   BSA:          1.998 m Patient Age:    69 years     BP:           156/83 mmHg Patient Gender: M            HR:           88 bpm. Exam Location:  Inpatient Procedure: 2D Echo, Cardiac Doppler and Color Doppler Indications:    Chemo evaluation  History:        Patient has prior history of Echocardiogram examinations, most  recent 11/14/2019. COPD; Risk Factors:Hypertension and Diabetes.                 Mantle cell lymphoma.  Sonographer:    Lavenia Atlas Referring Phys: [id number] KRISTIN R CURCIO IMPRESSIONS  1. Left ventricular ejection fraction, by estimation, is 65 to 70%. The left ventricle has normal function. The left ventricle has no regional wall motion abnormalities. Left ventricular diastolic parameters are consistent with Grade I diastolic dysfunction (impaired relaxation).  2. Right ventricular systolic function is normal. The right ventricular size is normal. Tricuspid regurgitation signal is  inadequate for assessing PA pressure.  3. The mitral valve is grossly normal. Trivial mitral valve regurgitation. No evidence of mitral stenosis.  4. The aortic valve is tricuspid. There is mild calcification of the aortic valve. There is mild thickening of the aortic valve. Aortic valve regurgitation is not visualized. Mild aortic valve sclerosis is present, with no evidence of aortic valve stenosis.  5. The inferior vena cava is normal in size with greater than 50% respiratory variability, suggesting right atrial pressure of 3 mmHg. Comparison(s): Changes from prior study are noted. EF is now 65-70%. FINDINGS  Left Ventricle: Left ventricular ejection fraction, by estimation, is 65 to 70%. The left ventricle has normal function. The left ventricle has no regional wall motion abnormalities. The left ventricular internal cavity size was normal in size. There is  no left ventricular hypertrophy. Left ventricular diastolic parameters are consistent with Grade I diastolic dysfunction (impaired relaxation). Normal left ventricular filling pressure. Right Ventricle: The right ventricular size is normal. No increase in right ventricular wall thickness. Right ventricular systolic function is normal. Tricuspid regurgitation signal is inadequate for assessing PA pressure. Left Atrium: Left atrial size was normal in size. Right Atrium: Right atrial size was normal in size. Pericardium: Trivial pericardial effusion is present. Presence of pericardial fat pad. Mitral Valve: The mitral valve is grossly normal. Trivial mitral valve regurgitation. No evidence of mitral valve stenosis. Tricuspid Valve: The tricuspid valve is grossly normal. Tricuspid valve regurgitation is not demonstrated. No evidence of tricuspid stenosis. Aortic Valve: The aortic valve is tricuspid. There is mild calcification of the aortic valve. There is mild thickening of the aortic valve. Aortic valve regurgitation is not visualized. Mild aortic valve  sclerosis is present, with no evidence of aortic valve stenosis. Aortic valve mean gradient measures 11.1 mmHg. Aortic valve peak gradient measures 16.7 mmHg. Aortic valve area, by VTI measures 2.42 cm. Pulmonic Valve: The pulmonic valve was grossly normal. Pulmonic valve regurgitation is not visualized. No evidence of pulmonic stenosis. Aorta: The aortic root is normal in size and structure. Venous: The right upper pulmonary vein is normal. The inferior vena cava is normal in size with greater than 50% respiratory variability, suggesting right atrial pressure of 3 mmHg. IAS/Shunts: The atrial septum is grossly normal. Additional Comments: There is a small pleural effusion in the left lateral region.  LEFT VENTRICLE PLAX 2D LVIDd:         4.30 cm  Diastology LVIDs:         2.60 cm  LV e' medial:    8.81 cm/s LV PW:         1.10 cm  LV E/e' medial:  7.2 LV IVS:        1.10 cm  LV e' lateral:   7.51 cm/s LVOT diam:     2.30 cm  LV E/e' lateral: 8.4 LV SV:         92 LV  SV Index:   46 LVOT Area:     4.15 cm  RIGHT VENTRICLE RV Basal diam:  3.20 cm RV S prime:     11.00 cm/s TAPSE (M-mode): 2.9 cm LEFT ATRIUM             Index       RIGHT ATRIUM           Index LA diam:        3.30 cm 1.65 cm/m  RA Area:     15.30 cm LA Vol (A2C):   43.2 ml 21.62 ml/m RA Volume:   37.60 ml  18.82 ml/m LA Vol (A4C):   30.7 ml 15.37 ml/m LA Biplane Vol: 36.9 ml 18.47 ml/m  AORTIC VALVE AV Area (Vmax):    2.42 cm AV Area (Vmean):   2.14 cm AV Area (VTI):     2.42 cm AV Vmax:           204.20 cm/s AV Vmean:          159.222 cm/s AV VTI:            0.381 m AV Peak Grad:      16.7 mmHg AV Mean Grad:      11.1 mmHg LVOT Vmax:         119.00 cm/s LVOT Vmean:        82.200 cm/s LVOT VTI:          0.222 m LVOT/AV VTI ratio: 0.58  AORTA Ao Root diam: 3.10 cm MITRAL VALVE MV Area (PHT): 4.06 cm     SHUNTS MV Decel Time: 187 msec     Systemic VTI:  0.22 m MV E velocity: 63.40 cm/s   Systemic Diam: 2.30 cm MV A velocity: 104.00 cm/s MV  E/A ratio:  0.61 Eleonore Chiquito MD Electronically signed by Eleonore Chiquito MD Signature Date/Time: 04/20/2020/10:32:25 AM    Final    [place] ASCITES (ABDOMEN LIMITED)  Result Date: 04/19/2020 CLINICAL DATA:  69 year old male with abdominal distension, ascites EXAM: LIMITED ABDOMEN ULTRASOUND FOR ASCITES TECHNIQUE: Limited ultrasound survey for ascites was performed in all four abdominal quadrants. COMPARISON:  CT abdomen/pelvis 04/19/2020 FINDINGS: Sonographic interrogation of the abdomen was performed in anticipation of paracentesis. Unfortunately, the volume is a ascites is quite small and insufficient for drainage. There is however extensive omental caking and peritoneal nodularity. IMPRESSION: 1. Small volume ascites, insufficient for paracentesis. 2. Extensive omental caking and peritoneal nodularity consistent with peritoneal carcinomatosis. Electronically Signed   By: Jacqulynn Cadet M.D.   On: 04/19/2020 16:53   VAS [place] LOWER EXTREMITY VENOUS (DVT)  Result Date: 05/01/2020  Lower Venous DVT Study Indications: Pain, Swelling, and Edema.  Risk Factors: Immobility confirmed PE. Performing Technologist: Griffin Basil RCT RDMS  Examination Guidelines: A complete evaluation includes B-mode imaging, spectral Doppler, color Doppler, and power Doppler as needed of all accessible portions of each vessel. Bilateral testing is considered an integral part of a complete examination. Limited examinations for reoccurring indications may be performed as noted. The reflux portion of the exam is performed with the patient in reverse Trendelenburg.  +---------+---------------+---------+-----------+----------+--------------+ RIGHT    CompressibilityPhasicitySpontaneityPropertiesThrombus Aging +---------+---------------+---------+-----------+----------+--------------+ CFV      Full           Yes      Yes                                 +---------+---------------+---------+-----------+----------+--------------+  SFJ  Full                                                        +---------+---------------+---------+-----------+----------+--------------+ FV Prox  Full                                                        +---------+---------------+---------+-----------+----------+--------------+ FV Mid   Full                                                        +---------+---------------+---------+-----------+----------+--------------+ FV DistalFull                                                        +---------+---------------+---------+-----------+----------+--------------+ PFV      Full                                                        +---------+---------------+---------+-----------+----------+--------------+ POP      Full           Yes      Yes                                 +---------+---------------+---------+-----------+----------+--------------+ PTV      Full                                                        +---------+---------------+---------+-----------+----------+--------------+ PERO     Full                                                        +---------+---------------+---------+-----------+----------+--------------+   +---------+---------------+---------+-----------+----------+--------------+ LEFT     CompressibilityPhasicitySpontaneityPropertiesThrombus Aging +---------+---------------+---------+-----------+----------+--------------+ CFV      Full           Yes      Yes                                 +---------+---------------+---------+-----------+----------+--------------+ SFJ      Full                                                        +---------+---------------+---------+-----------+----------+--------------+  FV Prox  Full                                                        +---------+---------------+---------+-----------+----------+--------------+ FV Mid   Full                                                         +---------+---------------+---------+-----------+----------+--------------+ FV DistalFull                                                        +---------+---------------+---------+-----------+----------+--------------+ PFV      Full                                                        +---------+---------------+---------+-----------+----------+--------------+ POP      Full           Yes      Yes                                 +---------+---------------+---------+-----------+----------+--------------+ PTV      Full                                                        +---------+---------------+---------+-----------+----------+--------------+ PERO     Full                                                        +---------+---------------+---------+-----------+----------+--------------+     Summary: RIGHT: - There is no evidence of deep vein thrombosis in the lower extremity.  - No cystic structure found in the popliteal fossa.  LEFT: - There is no evidence of deep vein thrombosis in the lower extremity.  - No cystic structure found in the popliteal fossa.  *See table(s) above for measurements and observations. Electronically signed by Curt Jews MD on 05/01/2020 at 2:50:45 PM.    Final    ECHOCARDIOGRAM LIMITED  Result Date: 04/30/2020    ECHOCARDIOGRAM LIMITED REPORT   Patient Name:   Dean Dixon Date of Exam: 04/30/2020 Medical Rec #:  [id number]    Height:       64.0 in Accession #:    [id number]   Weight:       205.0 lb Date of Birth:  27-Aug-1950   BSA:          1.977 m Patient Age:    61 years     BP:  114/60 mmHg Patient Gender: M            HR:           109 bpm. Exam Location:  Inpatient Procedure: Limited Echo, Limited Color Doppler and Cardiac Doppler Indications:    Pulmonary Embolus I26.99  History:        Patient has prior history of Echocardiogram examinations, most                 recent 04/20/2020. Mantle cell lymphoma;  Risk                 Factors:Hypertension and Diabetes.  Sonographer:    Mikki Santee RDCS (AE) Referring Phys: [id number] Jonnie Finner  Sonographer Comments: Echo performed with patient supine and on artificial respirator. IMPRESSIONS  1. Cystic structure noted in liver on Subcostal images.  2. Left ventricular ejection fraction, by estimation, is 60 to 65%. The left ventricle has normal function. The left ventricle has no regional wall motion abnormalities.  3. Right ventricular systolic function is normal. The right ventricular size is normal.  4. The pericardial effusion is posterior to the left ventricle.  5. The mitral valve is normal in structure. No evidence of mitral valve regurgitation. No evidence of mitral stenosis.  6. The aortic valve is normal in structure. Aortic valve regurgitation is not visualized. No aortic stenosis is present.  7. The inferior vena cava is normal in size with greater than 50% respiratory variability, suggesting right atrial pressure of 3 mmHg. FINDINGS  Left Ventricle: Left ventricular ejection fraction, by estimation, is 60 to 65%. The left ventricle has normal function. The left ventricle has no regional wall motion abnormalities. The left ventricular internal cavity size was normal in size. There is  no left ventricular hypertrophy. Right Ventricle: The right ventricular size is normal. No increase in right ventricular wall thickness. Right ventricular systolic function is normal. Left Atrium: Left atrial size was normal in size. Right Atrium: Right atrial size was normal in size. Pericardium: Trivial pericardial effusion is present. The pericardial effusion is posterior to the left ventricle. Mitral Valve: The mitral valve is normal in structure. No evidence of mitral valve stenosis. Tricuspid Valve: The tricuspid valve is normal in structure. Tricuspid valve regurgitation is mild . No evidence of tricuspid stenosis. Aortic Valve: The aortic valve is normal in structure.  Aortic valve regurgitation is not visualized. No aortic stenosis is present. Pulmonic Valve: The pulmonic valve was normal in structure. Pulmonic valve regurgitation is trivial. No evidence of pulmonic stenosis. Aorta: The aortic root is normal in size and structure. Venous: The inferior vena cava is normal in size with greater than 50% respiratory variability, suggesting right atrial pressure of 3 mmHg. IAS/Shunts: No atrial level shunt detected by color flow Doppler. Additional Comments: Cystic structure noted in liver on Subcostal images. LEFT VENTRICLE PLAX 2D LVIDd:         4.20 cm LVIDs:         2.60 cm LV PW:         1.00 cm LV IVS:        1.10 cm LVOT diam:     2.30 cm LVOT Area:     4.15 cm  RIGHT VENTRICLE RV S prime:     30.80 cm/s TAPSE (M-mode): 1.8 cm LEFT ATRIUM         Index LA diam:    3.00 cm 1.52 cm/m   AORTA Ao Root diam: 3.40 cm  SHUNTS Systemic  Diam: 2.30 cm Jenkins Rouge MD Electronically signed by Jenkins Rouge MD Signature Date/Time: 04/30/2020/9:18:44 AM    Final    [place] EKG SITE RITE  Result Date: 04/19/2020 If Site Rite image not attached, placement could not be confirmed due to current cardiac rhythm.   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 CD5+ clonal lymphoproliferative disorder" -04/26/2019 FISH revealed "Positive for p53 (17p13) deletion." -05/31/2019 PET/CT ([id number]) revealed "1. There are multiple prominent lymph nodes in the neck, both axilla, the retroperitoneum and pelvis with low level hypermetabolic activity consistent with chronic lymphocytic leukemia. Deauville 3 and 4. 2. Mild splenomegaly and mild generalized splenic hypermetabolic activity 3. No evidence of bone marrow involvement." -06/10/2019 Flow Pathology Report ([id number]) revealed "Abnormal B-cell population." -06/10/2019 Surgical  Pathology Report ([id number])  revealed "Atypical lymphoid proliferation consistent with mantle cell lymphoma." -09/19/19 PET Skull Base to Thigh ([id number])-- favorable response to current treatment -01/02/2020 PET/CT ([id number]) revealed "1. Continued further decrease in size and FDG accumulation in index cervical, axillary, retroperitoneal, and pelvic lymph nodes. No lymphadenopathy by size criteria today. FDG accumulation in these lymph nodes is compatible with Deauville 2 category. 2. Interval decrease in size with resolution of hypermetabolism associated with the spleen previously."  -04/19/2020 CT abdomen pelvis revealed "1. Dominant finding is new large (15 cm) lymphoid mass within LEFT upper quadrant. Mass is new from PET-CT 01/02/2020. Differential includes massive mesenteric adenopathy versus lymphoma of the small bowel or descending colon. No oral or IV contrast. 2. New periaortic retroperitoneal lymphadenopathy. New precordial Adenopathy. 3. Extensive fluid within the mesentery and omental thickening in LEFT lower quadrant. 4. Free fluid the pelvis with thin enhancing rim suggest peritonitis. 5. No evidence of bowel perforation. No evidence of bowel Obstruction." -04/28/2020 CTA chest and CT abdomen pelvis revealed "1. Study is positive for segmental sized pulmonary emboli in the left lung. These appear nonocclusive at this time, and there is no associated pulmonary hemorrhage to suggest frank pulmonary infarct. 2. Bulky soft tissue mass in the left upper quadrant with extensive lymphadenopathy in the chest and abdomen, and evidence of widespread intraperitoneal metastatic disease, as detailed above. 3. Small bilateral pleural effusions lying dependently. 4. Aortic atherosclerosis. 5. Tracheobronchomalacia. 6. Cholelithiasis. 7. Colonic diverticulosis."  2) Rigors from Rituxan - needing demerol as premedication. Rituxan rate to be capped at $RemoveB'150mg'nSFUiTYe$ /hour  3) Thrombocytopenia from  chemotherapy  4) Neutropenia due to recent chemotherapy  5) anemia  6) significant new abdominal distention due to lymphoma recurrence  7) new hypercalcemia- ?  Related to dehydration and  Lymphoma -resolved at this time  8) acute hypoxic respiratory failure-multifactorial due to abdominal distention, PE, and hypertensive emergency  9) left segmental PE  10) AKI  11) tumor lysis syndrome  12) pancytopenia related to chemotherapy and possible sepsis.  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  He received 1 cycle of CHOP with no improvement in his left upper quadrant soft tissue mass and abdominal distention at this time. --Has been seen by radiation oncology with plans to begin radiation to the soft tissue mass in his left upper quadrant today. -I have spoken with our oral chemotherapy pharmacist and we have acalabrutinib available and ready to administer once the patient is stabilized and blood counts are rebounding.  Counts continue to drop and we will hold on this  for now. -The patient has received G-CSF already and recommend close monitoring of his CBC with differential.  - Transfuse for hemoglobin less than 8 and platelets less than 30,000 or active bleeding.  I have ordered 1 unit of PRBCs today. -Monitor for bleeding on anticoagulation. -Antibiotics per primary team. -Received rasburicase on 05/01/2020 for hyperuricemia.  Continues to have hyperuricemia and I have ordered a repeat dose of rasburicase today.  Continue allopurinol. -Renal function improved today.  Monitor closely and consider nephrology consult if worsening.   LOS: 4 days   Mikey Bussing   ADDENDUM  .Patient was Personally and independently interviewed, examined and relevant elements of the history of present illness were reviewed in details and an assessment and plan was created. All elements of the patient's history of present illness , assessment  and plan were discussed in details with Mikey Bussing . The above documentation reflects our combined findings assessment and plan. -ordered additional unit of PRBC -electrolyte mx per ICU team/CCM -rasburicase x 1 again today for hyperuricemia. -will hold off on start Acalabrutinib at this time till counts improve -appreciate excellent ICU cares.  Sullivan Lone MD MS

## 2020-05-03 NOTE — Progress Notes (Addendum)
Nutrition Follow-up  DOCUMENTATION CODES:   Obesity unspecified  INTERVENTION:  Monitor for results of speech evaluation/nutrition plan.   If pt does not pass swallow evaluation and is to remain NPO, recommend placing NGT and re-initiating TF. Consider Osmolite 1.5 @ 60m/hr with 479mProsource TF BID. This would provide 2240 kcals, 112 grams protein, 109779mree water.   If pt does pass swallow evaluation, will order appropriate oral nutrition supplement(s) to help pt meet elevated protein/kcal needs. If able to safely consume thin liquids, recommend Ensure Enlive. If pt is limited to nectar thick liquids, recommend Vital Cuisine shakes. If pt is limited to honey thick liquids, will be limited to [employer] oral nutrition supplements available on formulary for pudding thick supplements.    NUTRITION DIAGNOSIS:   Inadequate oral intake related to inability to eat as evidenced by NPO status.  ongoing  GOAL:   Provide needs based on ASPEN/SCCM guidelines  Not met  MONITOR:   Vent status, Labs, Weight trends, Skin  REASON FOR ASSESSMENT:   Ventilator    ASSESSMENT:   Pt with stage IV mantle cell lymphoma on CHOP presented with N/V and dyspnea x2 weeks found to have acute left segmental PE. PMH also includes DM and HTN.   11/07 intubated due to respiratory distress and hypertension  11/09 extubated, OGT removed  Pt now on room air with PRN BiPAP. Pt with possible GIB, receiving transfusion. Pt also with pancytopenia suspected to be in the setting of chemotherapy, on Heparin which may need to be d/c'd if active bleeding. Per MD, plan is for pt to receive radiation treatment when more stable.  Pt continues to be NPO. SLP attempted to see pt yesterday but cancelled session due to pt being hypotensive and on BiPAP. SLP scheduled to attempt evaluation on pt today. Will monitor for nutrition plan of care.   Admit wt: 94.9 kg Current wt: 93.2 kg   UOP: 2575m97m4  hours Stool: 30ml92m rectal tube x24 hours  Labs (taken 11/10): K+ 6.1 (H) CBGs 135-150 Medications: ss novolog Q4H, protonix, IV abx  Diet Order:   Diet Order            Diet NPO time specified  Diet effective now                 EDUCATION NEEDS:   No education needs have been identified at this time  Skin:  Skin Assessment: Skin Integrity Issues: Skin Integrity Issues:: Stage II Stage II: coccyx  Last BM:  11/10 30ml 58mrectal tube  Height:   Ht Readings from Last 1 Encounters:  05/13/2020 _0  (1.626 m)    Weight:   Wt Readings from Last 1 Encounters:  05/03/20 93.2 kg    BMI:  Body mass index is 35.27 kg/m.  Estimated Nutritional Needs:   Kcal:  2100-2300  Protein:  105-115 grams  Fluid:  >2L/d    AmandaLarkin InaRD, LDN RD pager number and weekend/on-call pager number located in [email or web address]

## 2020-05-03 NOTE — Progress Notes (Signed)
[facility] Progress Note Patient Name: Dean Dixon DOB: 03-04-1951 MRN: [id number]   Date of Service  05/03/2020  HPI/Events of Note  Patient has a triple lumen CVC in place and there is an order  For a separate PICC line to be placed, it is not clear whether this order is part of discharge planning for home chemotherapy.  eICU Interventions  Will discontinue the PICC order for tonight, if needed  As part of discharge planning the daytime PCCM attending can re-order.        Kerry Kass Beila Purdie 05/03/2020, 11:17 PM

## 2020-05-03 NOTE — Evaluation (Signed)
Occupational Therapy Evaluation Patient Details Name: Dean Dixon MRN: [id number] DOB: August 22, 1950 Today's Date: 05/03/2020    History of Present Illness 69 year old male with stage IV mantle cell lymphoma presenting 05/15/2020 with N/V and progressive SOB found to have acute left segmental PE. Developed hypertension 250/100 with respiratory distress with chest pain, unable to tolerate BiPAP and was intubated, Extubated 11/9.   Clinical Impression   Mr. Dean Dixon is a 69 year old man s/p extubation who presents with generalized weakness, decreased activity tolerance, edema in upper and lower extremities, impaired cognition and presumed balance deficits resulting in a significant decline in functional abilities. Patient is typically independent and lives alone. On evaluation patient only able to perform rolling in bed with extremity movement. Patient weak and unable to sustain prolonged muscle activation resulting in needing assistance with grooming and feeding as well. Patient presents with incontinence and requiring external foley catheter and rectal tube at this time. Patient predominantly max-total assist for ADLs at bed level. Patient will benefit from skilled OT services while in hospital to improve deficits and learn compensatory strategies as needed in order to return to PLOF.  Patient will benefit from short term rehab at discharge.    Follow Up Recommendations  SNF    Equipment Recommendations  Other (comment) (defer to next venue)    Recommendations for Other Services       Precautions / Restrictions Precautions Precautions: Fall Precaution Comments: incontinence, flexiseal Restrictions Weight Bearing Restrictions: No      Mobility Bed Mobility Overal bed mobility: Needs Assistance Bed Mobility: Rolling Rolling: Mod assist;+2 for safety/equipment         General bed mobility comments: rolled to each side for bed to bed changed, difficulty reaching for the rails, arms  are weak.    Transfers                 General transfer comment: Placed in bed/chair position , did not attempt to sit up  after  bed mobility activity.    Balance                                           ADL either performed or assessed with clinical judgement   ADL Overall ADL's : Needs assistance/impaired Eating/Feeding: Set up;Moderate assistance;Bed level   Grooming: Sitting;Bed level;Moderate assistance   Upper Body Bathing: Moderate assistance;Bed level   Lower Body Bathing: Total assistance;Bed level   Upper Body Dressing : Maximal assistance;Bed level   Lower Body Dressing: Total assistance;Bed level;+2 for physical assistance   Toilet Transfer: +2 for physical assistance;Total assistance   Toileting- Clothing Manipulation and Hygiene: Total assistance;Bed level;+2 for physical assistance               Vision   Vision Assessment?: No apparent visual deficits     Perception     Praxis      Pertinent Vitals/Pain Pain Assessment: Faces Faces Pain Scale: Hurts a little bit Pain Location: when rolling Pain Descriptors / Indicators: Grimacing;Discomfort Pain Intervention(s): Limited activity within patient's tolerance;Repositioned     Hand Dominance Right   Extremity/Trunk Assessment Upper Extremity Assessment Upper Extremity Assessment: Generalized weakness   Lower Extremity Assessment Lower Extremity Assessment: Defer to PT evaluation   Cervical / Trunk Assessment Cervical / Trunk Assessment:  (NT)   Communication Communication Communication:  (speech is slurred and soft)   Cognition  Arousal/Alertness: Awake/alert Behavior During Therapy: WFL for tasks assessed/performed Overall Cognitive Status: Impaired/Different from baseline Area of Impairment: Orientation;Awareness                 Orientation Level: Time;Situation     Following Commands: Follows one step commands inconsistently   Awareness:  Emergent   General Comments: patient slow to respond,   General Comments  NT , did sit at midline when [facility] raised maximally    Exercises     Shoulder Instructions      Home Living Family/patient expects to be discharged to:: Private residence Living Arrangements: Alone   Type of Home: House       Home Layout: One level               Home Equipment: None          Prior Functioning/Environment Level of Independence: Independent        Comments: was ambulatory 11/1 when DC[facility] from [facility]        OT Problem List: Decreased strength;Decreased activity tolerance;Decreased knowledge of use of DME or AE;Obesity;Pain;Increased edema;Decreased cognition;Impaired balance (sitting and/or standing)      OT Treatment/Interventions: Self-care/ADL training;Therapeutic exercise;DME and/or AE instruction;Therapeutic activities;Patient/family education;Balance training    OT Goals(Current goals can be found in the care plan section) Acute Rehab OT Goals Patient Stated Goal: did not state OT Goal Formulation: Patient unable to participate in goal setting Time For Goal Achievement: 05/17/20 Potential to Achieve Goals: Good  OT Frequency: Min 2X/week   Barriers to D/C:            Co-evaluation PT/OT/SLP Co-Evaluation/Treatment: Yes Reason for Co-Treatment: For patient/therapist safety PT goals addressed during session: Mobility/safety with mobility OT goals addressed during session: ADL's and self-care      AM-PAC OT "6 Clicks" Daily Activity     Outcome Measure Help from another person eating meals?: A Lot Help from another person taking care of personal grooming?: A Lot Help from another person toileting, which includes using toliet, bedpan, or urinal?: Total Help from another person bathing (including washing, rinsing, drying)?: A Lot Help from another person to put on and taking off regular upper body clothing?: A Lot Help from another person to put on and taking off  regular lower body clothing?: Total 6 Click Score: 10   End of Session Nurse Communication: Mobility status  Activity Tolerance: Patient limited by fatigue Patient left: in bed;with call bell/phone within reach;with nursing/sitter in room  OT Visit Diagnosis: Muscle weakness (generalized) (M62.81)                Time: 0981-1914 OT Time Calculation (min): 34 min Charges:  OT General Charges $OT Visit: 1 Visit OT Evaluation $OT Eval Moderate Complexity: 1 Mod  Anani Gu, OTR/L [place]  Office [phone] Pager: [phone]   Lenward Chancellor 05/03/2020, 4:15 PM

## 2020-05-03 NOTE — Consult Note (Signed)
Consultation Note Date: 05/03/2020   Patient Name: Dean Dixon  DOB: February 25, 1951  MRN: [id number]  Age / Sex: 69 y.o., male  PCP: Leonides Sake, MD Referring Physician: Laurin Coder, MD  Reason for Consultation: Establishing goals of care  HPI/Patient Profile: 69 y.o. male  admitted on 05/15/2020   69 year old gentleman who lives in [place], [place] by himself, life limiting illness of stage IV mantle cell lymphoma.   Clinical Assessment and Goals of Care: Patient recently received 1 cycle of CHOP regimen with no improvement in left upper quadrant soft tissue mass, was recently seen by radiation oncology with plans to begin radiation to soft tissue mass.  Also being considered for oral chemotherapy.  Medical oncology following closely.  Palliative consult for additional support has been requested. Patient is awake alert resting in bed. Currently off O2, 97% on room air, patient states he is feeling "so-so", he has some oral secretions he is trying to spit out. Denies pain, states that everyone is doing a good job of explaining things to him.  Introduced myself and palliative care as follows:  Palliative medicine is specialized medical care for people living with serious illness. It focuses on providing relief from the symptoms and stress of a serious illness. The goal is to improve quality of life for both the patient and the family.  Goals of care: Broad aims of medical therapy in relation to the patient's values and preferences. Our aim is to provide medical care aimed at enabling patients to achieve the goals that matter most to them, given the circumstances of their particular medical situation and their constraints.   HCPOA  Bernadette Hoit is HCPOA  SUMMARY OF RECOMMENDATIONS    Full Code, Full Scope of care Radiation treatments SLP has recommended MBS study.  Recommend SNF or CIR,  with palliative support.   Code Status/Advance Care Planning:  Full code    Symptom Management:   Continue current mode of care  Palliative Prophylaxis:   Delirium Protocol  Additional Recommendations (Limitations, Scope, Preferences):  Full Scope Treatment  Psycho-social/Spiritual:   Desire for further Chaplaincy support:yes  Additional Recommendations: Caregiving  Support/Resources  Prognosis:   Unable to determine  Discharge Planning: To Be Determined      Primary Diagnoses: Present on Admission: . Pulmonary embolism ([place])   I have reviewed the medical record, interviewed the patient and family, and examined the patient. The following aspects are pertinent.  Past Medical History:  Diagnosis Date  . Cancer ([place])    Mantle Cell Lymphoma  . Diabetes mellitus without complication ([place])   . Hypertension    Social History   Socioeconomic History  . Marital status: Divorced    Spouse name: Not on file  . Number of children: Not on file  . Years of education: Not on file  . Highest education level: Not on file  Occupational History  . Not on file  Tobacco Use  . Smoking status: Never Smoker  . Smokeless tobacco: Never Used  Substance and  Sexual Activity  . Alcohol use: Yes    Alcohol/week: 2.0 standard drinks    Types: 2 Standard drinks or equivalent per week  . Drug use: No  . Sexual activity: Not on file  Other Topics Concern  . Not on file  Social History Narrative  . Not on file   Social Determinants of Health   Financial Resource Strain:   . Difficulty of Paying Living Expenses: Not on file  Food Insecurity:   . Worried About Programme researcher, broadcasting/film/video in the Last Year: Not on file  . Ran Out of Food in the Last Year: Not on file  Transportation Needs:   . Lack of Transportation (Medical): Not on file  . Lack of Transportation (Non-Medical): Not on file  Physical Activity:   . Days of Exercise per Week: Not on file  . Minutes of Exercise  per Session: Not on file  Stress:   . Feeling of Stress : Not on file  Social Connections:   . Frequency of Communication with Friends and Family: Not on file  . Frequency of Social Gatherings with Friends and Family: Not on file  . Attends Religious Services: Not on file  . Active Member of Clubs or Organizations: Not on file  . Attends Banker Meetings: Not on file  . Marital Status: Not on file   No family history on file. Scheduled Meds: . sodium chloride   Intravenous Once  . sodium chloride   Intravenous Once  . allopurinol  100 mg Oral BID  . amLODipine  10 mg Per Tube Daily  . chlorhexidine gluconate (MEDLINE KIT)  15 mL Mouth Rinse BID  . Chlorhexidine Gluconate Cloth  6 each Topical Daily  . insulin aspart  0-15 Units Subcutaneous Q4H  . mouth rinse  15 mL Mouth Rinse 10 times per day  . pantoprazole (PROTONIX) IV  40 mg Intravenous Q24H  . sodium chloride flush  10-40 mL Intracatheter Q12H   Continuous Infusions: . sodium chloride Stopped (05/03/20 0253)  . ceFEPime (MAXIPIME) IV 2 g (05/03/20 1410)  . heparin 1,250 Units/hr (05/03/20 0158)  . rasburicase (ELITEK) IV infusion     PRN Meds:.acetaminophen **OR** acetaminophen, fentaNYL (SUBLIMAZE) injection, Gerhardt's butt cream, guaiFENesin, sodium chloride flush Medications Prior to Admission:  Prior to Admission medications   Medication Sig Start Date End Date Taking? Authorizing Provider  allopurinol (ZYLOPRIM) 300 MG tablet Take 1 tablet (300 mg total) by mouth daily. 04/24/20 05/24/20 Yes Ghimire, Lyndel Safe, MD  amLODipine (NORVASC) 10 MG tablet Take 10 mg by mouth daily.   Yes [provider]  insulin detemir (LEVEMIR) 100 UNIT/ML FlexPen Inject 10 Units into the skin at bedtime for 5 days. Patient taking differently: Inject 11 Units into the skin at bedtime.  11/17/19 05/12/2020 Yes Narda Bonds, MD  ramipril (ALTACE) 10 MG capsule Take 10 mg by mouth daily.   Yes [provider]  blood  glucose meter kit and supplies Check blood sugar 4 times daily (Before breakfast, lunch, dinner, bedtime). (FOR ICD-10 E10.9, E11.9). 11/17/19   Narda Bonds, MD  Insulin Pen Needle 31G X 5 MM MISC BD Pen Needles- brand specific Inject insulin via insulin pen 6 x daily 11/17/19   Narda Bonds, MD  prochlorperazine (COMPAZINE) 10 MG tablet Take 1 tablet (10 mg total) by mouth every 6 (six) hours as needed (Nausea or vomiting). Patient not taking: Reported on 04/19/2020 06/20/19 04/19/20  Johney Maine, MD   Allergies  Allergen Reactions  . Ivp Dye [Iodinated Diagnostic Agents] Shortness Of Breath    "allergic to CT scan dye", took benadryl with last scan and tolerated well   Review of Systems +secretions.   Physical Exam Awake alert sitting up in bed Has abdominal distention Has edema Awake alert oriented S1-S2 Lungs clear  Vital Signs: BP (!) 145/61   Pulse 98   Temp 98.7 F (37.1 C) (Oral)   Resp (!) 21   Ht $R'5\' 4"'TB$  (1.626 m)   Wt 93.2 kg   SpO2 97%   BMI 35.27 kg/m  Pain Scale: 0-10   Pain Score: 0-No pain   SpO2: SpO2: 97 % O2 Device:SpO2: 97 % O2 Flow Rate: .O2 Flow Rate (L/min): 15 L/min  IO: Intake/output summary:   Intake/Output Summary (Last 24 hours) at 05/03/2020 1554 Last data filed at 05/03/2020 0500 Gross per 24 hour  Intake 427.38 ml  Output 1630 ml  Net -1202.62 ml    LBM: Last BM Date: 05/02/20 Baseline Weight: Weight: 97.5 kg Most recent weight: Weight: 93.2 kg     Palliative Assessment/Data:   PPS 40%  Time In: 1500 Time Out: 1600 Time Total:   60 Greater than 50%  of this time was spent counseling and coordinating care related to the above assessment and plan.  Signed by: Loistine Chance, MD   Please contact Palliative Medicine Team phone at [phone] for questions and concerns.  For individual provider: See Shea Evans

## 2020-05-03 NOTE — Progress Notes (Signed)
HEMATOLOGY-ONCOLOGY PROGRESS NOTE  SUBJECTIVE:  Patient was seen in oncology follow-up.  He is off the BiPAP on nasal cannula.  Mild simple verbalization and shows thumbs up sign when asked how he is doing.  Still appears very fatigued and weak. Hemoglobin is down to 5.5 and patient is receiving transfusions. Received rasburicase for hyperuricemia due to tumor lysis. Being seen by radiation oncology for possible palliative radiation of his massive abdominal lymphoma mass. 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)   06/29/2019 - 10/20/2019 Chemotherapy   The patient had dexamethasone (DECADRON) 4 MG tablet, 8 mg, Oral, Daily, 1 of 1 cycle, Start date: 06/20/2019, End date: 11/17/2019 palonosetron (ALOXI) injection 0.25 mg, 0.25 mg, Intravenous,  Once, 5 of 5 cycles Administration: 0.25 mg (07/27/2019), 0.25 mg (06/30/2019), 0.25 mg (08/24/2019), 0.25 mg (10/20/2019), 0.25 mg (09/21/2019) pegfilgrastim (NEULASTA ONPRO KIT) injection 6 mg, 6 mg, Subcutaneous, Once, 3 of 3 cycles Administration: 6 mg (08/25/2019), 6 mg (09/22/2019) bendamustine (BENDEKA) 200 mg in sodium chloride 0.9 % 50 mL (3.4483 mg/mL) chemo infusion, 90 mg/m2 = 200 mg, Intravenous,  Once, 5 of 5 cycles Administration: 200 mg (06/29/2019), 200 mg (06/30/2019), 200 mg (07/27/2019), 200 mg (07/28/2019), 200 mg (08/24/2019), 200 mg (08/25/2019), 200 mg (09/21/2019), 200 mg (09/22/2019) riTUXimab-pvvr (RUXIENCE) 800 mg in sodium chloride 0.9 % 250 mL (2.4242 mg/mL) infusion, 375 mg/m2 = 800 mg, Intravenous,  Once, 5 of 5 cycles Dose modification: 375 mg/m2 (original dose 375 mg/m2, Cycle 2), 400 mg (original dose 375 mg/m2, Cycle 2, Reason: Other (see comments), Comment: finishing rituximab from previous day, infusion not completed), 300 mg (original dose 300 mg, Cycle 4) Administration: 800 mg (06/29/2019), 800 mg (07/27/2019), 800 mg  (08/24/2019), 400 mg (07/28/2019), 800 mg (10/20/2019), 800 mg (09/21/2019), 300 mg (09/22/2019)  for chemotherapy treatment.    04/20/2020 -  Chemotherapy   The patient had DOXOrubicin (ADRIAMYCIN) chemo injection 100 mg, 48 mg/m2 = 104 mg, Intravenous,  Once, 1 of 8 cycles Administration: 100 mg (04/21/2020) palonosetron (ALOXI) injection 0.25 mg, 0.25 mg, Intravenous,  Once, 1 of 8 cycles Administration: 0.25 mg (04/21/2020) pegfilgrastim-cbqv (UDENYCA) injection 6 mg, 6 mg, Subcutaneous, Once, 1 of 1 cycle Administration: 6 mg (04/24/2020) vinCRIStine (ONCOVIN) 2 mg in sodium chloride 0.9 % 50 mL chemo infusion, 2 mg, Intravenous,  Once, 1 of 8 cycles Administration: 2 mg (04/21/2020) cyclophosphamide (CYTOXAN) 1,560 mg in sodium chloride 0.9 % 250 mL chemo infusion, 750 mg/m2 = 1,560 mg, Intravenous,  Once, 1 of 8 cycles Administration: 1,560 mg (04/21/2020) fosaprepitant (EMEND) 150 mg in sodium chloride 0.9 % 145 mL IVPB, 150 mg, Intravenous,  Once, 1 of 8 cycles Administration: 150 mg (04/21/2020)  for chemotherapy treatment.       REVIEW OF SYSTEMS:   Unable to obtain a comprehensive review of systems secondary to patient condition  PHYSICAL EXAMINATION: ECOG PERFORMANCE STATUS: 2 - Symptomatic, <50% confined to bed  Vitals:   05/03/20 0600 05/03/20 0700  BP: (!) 138/59 129/67  Pulse: 93 95  Resp: 17 (!) 26  Temp: 97.9 F (36.6 C) 98.1 F (36.7 C)  SpO2: 96% 96%   Filed Weights   05/01/2020 1826 04/30/20 0315 05/03/20 0700  Weight: 209 lb 3.5 oz (94.9 kg) 205 lb 0.4 oz (93 kg) 205 lb 7.5 oz (93.2 kg)    Intake/Output from previous day: 11/10 0701 - 11/11 0700 In:   1829.3 [I.V.:1012.3; Blood:651.8; IV Piggyback:165.3] Out: 2605 [Urine:2575; Stool:30]  GENERAL: Opens eyes, able to answer questions SKIN: skin color, texture, turgor are normal, no rashes or significant lesions EYES: normal, Conjunctiva are pink and non-injected, sclera clear LUNGS: Clear HEART: regular  rate & rhythm and no murmurs, 2+ bilateral lower extremity edema ABDOMEN: Significant abdominal distention NEURO: Sedated but opens eyes  LABORATORY DATA:  I have reviewed the data as listed CMP Latest Ref Rng & Units 05/02/2020 05/01/2020 04/30/2020  Glucose 70 - 99 mg/dL 255(H) 143(H) 203(H)  BUN 8 - 23 mg/dL 93(H) 67(H) 66(H)  Creatinine 0.61 - 1.24 mg/dL 3.10(H) 1.90(H) 2.10(H)  Sodium 135 - 145 mmol/L 143 147(H) 143  Potassium 3.5 - 5.1 mmol/L 6.1(H) 4.9 5.1  Chloride 98 - 111 mmol/L 109 110 109  CO2 22 - 32 mmol/L 19(L) 26 23  Calcium 8.9 - 10.3 mg/dL 6.9(L) 7.9(L) 7.7(L)  Total Protein 6.5 - 8.1 g/dL - - -  Total Bilirubin 0.3 - 1.2 mg/dL - - -  Alkaline Phos 38 - 126 U/L - - -  AST 15 - 41 U/L - - -  ALT 0 - 44 U/L - - -   . CBC Latest Ref Rng & Units 05/03/2020 05/02/2020 05/02/2020  WBC 4.0 - 10.5 K/uL 2.4(L) - 1.8(L)  Hemoglobin 13.0 - 17.0 g/dL 7.4(L) 7.8(L) 5.5(LL)  Hematocrit 39 - 52 % 22.6(L) 24.0(L) 17.6(L)  Platelets 150 - 400 K/uL 36(L) - 45(L)    Lab Results  Component Value Date   WBC 2.4 (L) 05/03/2020   HGB 7.4 (L) 05/03/2020   HCT 22.6 (L) 05/03/2020   MCV 89.7 05/03/2020   PLT 36 (L) 05/03/2020   NEUTROABS 0.7 (L) 05/02/2020    CT ABDOMEN PELVIS WO CONTRAST  Addendum Date: 04/19/2020   ADDENDUM REPORT: 04/19/2020 12:54 ADDENDUM: The peritoneal fluid within the pelvis is not enhancing as this is a non IV contrast imaging exam. Therefore would described fluid collection as having thickened rim. Favor peritoneal metastasis. Findings conveyed toSCOTT GOLDSTON on 04/19/2020  at12:54. Electronically Signed   By: Suzy Bouchard M.D.   On: 04/19/2020 12:54   Result Date: 04/19/2020 CLINICAL DATA:  Abdominal pain.  Tachycardia.  Mantle cell lymphoma EXAM: CT ABDOMEN AND PELVIS WITHOUT CONTRAST TECHNIQUE: Multidetector CT imaging of the abdomen and pelvis was performed following the standard protocol without IV contrast. COMPARISON:  PET-CT scan 01/02/2020  FINDINGS: Lower chest: New small RIGHT effusion. No pericardial effusion. There is new precordial adenopathy anterior to the RIGHT heart and liver (image 9/2). Hepatobiliary: New intraperitoneal free fluid along the margin of the RIGHT hepatic lobe. This fluid is simple attenuation. The liver appears unchanged on noncontrast exam. Large cyst the LEFT hepatic lobe. Gallbladder normal. Pancreas: Pancreas is grossly normal noncontrast exam Spleen: . spleen is unchanged Adrenals/urinary tract: Adrenal glands kidneys and ureters normal. Bladder Stomach/Bowel: Stomach duodenum normal. There is a new mass lesion in the LEFT upper quadrant measuring 14.2 by 13.0 cm. The mass may be associated the small bowel or the small bowel mesentery. The descending colon is also involving the mass (image 43/2. There is extensive fluid within the leaves of the mesentery of the distal small bowel. Appendix normal. Ascending transverse colon normal. The descending colon tracks along the new LEFT upper quadrant mass. Vascular/Lymphatic: New large LEFT upper quadrant mass either represents lymphoma involving the small bowel or mesentery. Mass is very large described the stomach section. Additionally there is new periportal adenopathy with enlarged lymph nodes  along the aorta measuring up to 2 cm (image 44/2. There is nodularity along the greater omentum abdominal on the LEFT. Reproductive: Prostate unremarkable Other: Small volume free fluid the pelvis. There is enhancing rim to the fluid (image 73/2 which could indicate peritonitis. Musculoskeletal: No aggressive osseous lesion. IMPRESSION: 1. Dominant finding is new large (15 cm) lymphoid mass within LEFT upper quadrant. Mass is new from PET-CT 01/02/2020. Differential includes massive mesenteric adenopathy versus lymphoma of the small bowel or descending colon. No oral or IV contrast. 2. New periaortic retroperitoneal lymphadenopathy. New precordial adenopathy. 3. Extensive fluid within  the mesentery and omental thickening in LEFT lower quadrant. 4. Free fluid the pelvis with thin enhancing rim suggest peritonitis. 5. No evidence of bowel perforation. No evidence of bowel obstruction. Electronically Signed: By: Suzy Bouchard M.D. On: 04/19/2020 12:39   DG Chest 1 View  Result Date: 05/03/2020 CLINICAL DATA:  Elevated blood pressure. EXAM: CHEST  1 VIEW COMPARISON:  April 29, 2020 FINDINGS: The lung volumes are low. There are small bilateral pleural effusions with adjacent atelectasis. The heart size is mildly enlarged. The lung volumes are low. There is no pneumothorax. IMPRESSION: 1. Low lung volumes. 2. Small bilateral pleural effusions with adjacent atelectasis. Electronically Signed   By: Constance Holster M.D.   On: 04/27/2020 20:42   DG Chest 2 View  Result Date: 05/01/2020 CLINICAL DATA:  Shortness of breath lasting several days. Groin swelling and redness. EXAM: CHEST - 2 VIEW COMPARISON:  Chest CT 04/19/20 FINDINGS: Stable cardiomediastinal contours. Low lung volumes. Unchanged small bilateral pleural effusions. No airspace densities. IMPRESSION: Low lung volumes with persistent small bilateral pleural effusions. Electronically Signed   By: Kerby Moors M.D.   On: 05/20/2020 05:09   DG Abd 1 View  Result Date: 05/11/2020 CLINICAL DATA:  Tube placement EXAM: ABDOMEN - 1 VIEW COMPARISON:  April 29, 2020 FINDINGS: The enteric tube projects over the gastric antrum/pylorus. The tube is pointed distally. The visualized bowel gas pattern is unremarkable. IMPRESSION: The enteric tube projects over the gastric antrum/pylorus. Electronically Signed   By: Constance Holster M.D.   On: 05/05/2020 22:05   CT CHEST WO CONTRAST  Result Date: 04/19/2020 CLINICAL DATA:  Pneumonia. Pleural effusion. Concern for metastases. EXAM: CT CHEST WITHOUT CONTRAST TECHNIQUE: Multidetector CT imaging of the chest was performed following the standard protocol without IV contrast. COMPARISON:   CT dated Nov 14, 2019 FINDINGS: Cardiovascular: The heart size is stable. Coronary artery calcifications are noted. There is no significant pericardial effusion. Mediastinum/Nodes: -- No mediastinal lymphadenopathy. -- No hilar lymphadenopathy. --there is left axillary adenopathy. This has progressed since the prior study. There is mild right axillary adenopathy, progressed from prior study. -- No supraclavicular lymphadenopathy. -- Normal thyroid gland where visualized. -there is mild diffuse esophageal wall thickening. Lungs/Pleura: There are trace to small bilateral pleural effusions, right greater than left. There is atelectasis at the right lung base. There is no pneumothorax. No area of consolidation concerning for pneumonia. Upper Abdomen: There is a partially visualized mass in the left upper quadrant, better visualized on the patient's prior CT. There is free fluid in the upper abdomen. Multiple enlarged pericardiophrenic lymph nodes are noted. There appears to be retroperitoneal adenopathy. Musculoskeletal: No chest wall abnormality. No bony spinal canal stenosis. IMPRESSION: 1. Trace to small bilateral pleural effusions, right greater than left. There is atelectasis at the right lung base. No area of consolidation concerning for pneumonia. 2. Mild diffuse esophageal wall thickening. Correlation with patient's  symptoms is recommended. 3. Worsening axillary adenopathy. 4. Partially visualized mass in the left upper quadrant, better visualized on the patient's prior CT. 5. Small volume ascites. Aortic Atherosclerosis (ICD10-I70.0). Electronically Signed   By: Christopher  Green M.D.   On: 04/19/2020 20:37   CT Angio Chest PE W and/or Wo Contrast  Result Date: 05/21/2020 CLINICAL DATA:  68-year-old male with history of shortness of breath for the past few days. Abdominal distension. History of mantle cell lymphoma. EXAM: CT ANGIOGRAPHY CHEST CT ABDOMEN AND PELVIS WITH CONTRAST TECHNIQUE: Multidetector CT  imaging of the chest was performed using the standard protocol during bolus administration of intravenous contrast. Multiplanar CT image reconstructions and MIPs were obtained to evaluate the vascular anatomy. Multidetector CT imaging of the abdomen and pelvis was performed using the standard protocol during bolus administration of intravenous contrast. CONTRAST:  100mL OMNIPAQUE IOHEXOL 350 MG/ML SOLN COMPARISON:  CT the abdomen and pelvis 04/19/2020. CT the chest 04/19/2020. FINDINGS: CTA CHEST FINDINGS Cardiovascular: Study is limited by patient respiratory motion. However, despite this limitation there are segmental sized filling defects within pulmonary arteries to the left upper and lower lobes best appreciated on axial images 73 and 106 of series 5, and axial image 148 of series 5 respectively, compatible with pulmonary embolism. Heart size is normal. There is no significant pericardial fluid, thickening or pericardial calcification. Aortic atherosclerosis. No definite coronary artery calcifications. Mediastinum/Nodes: Multiple enlarged anterior mediastinal lymph nodes noted inferiorly, immediately above the right hemidiaphragm, measuring up to 2.3 cm in short axis (axial image 13 of series 11), increased compared to the prior study. Prominent but nonenlarged distal paraesophageal lymph node measuring 8 mm in short axis, nonspecific. Circumferential thickening of the distal third of the esophagus. Mildly enlarged left axillary lymph nodes measuring up to 1.3 cm in short axis. Lungs/Pleura: Collapse of the trachea and mainstem bronchi during expiration, indicative of tracheobronchomalacia. No acute consolidative airspace disease. Small bilateral pleural effusions lying dependently. No definite suspicious appearing pulmonary nodules or masses are noted. Musculoskeletal: There are no aggressive appearing lytic or blastic lesions noted in the visualized portions of the skeleton. Review of the MIP images confirms  the above findings. CT ABDOMEN and PELVIS FINDINGS Hepatobiliary: Multiple well-defined low-attenuation lesions are again noted throughout the liver, similar in size, number and distribution to the prior study, largest of which are compatible with simple cysts measuring up to 5.3 x 3.7 cm in segment 2 and for a (axial image 18 of series 11). Smaller lesions are too small to definitively characterize, but also favored to represent small cysts or biliary hamartomas. No new suspicious hepatic lesions. No intra or extrahepatic biliary ductal dilatation. 6 mm calcified gallstone lying dependently in the gallbladder. Gallbladder is not distended. Pancreas: No pancreatic mass. No pancreatic ductal dilatation. No pancreatic or peripancreatic fluid collections or inflammatory changes. Spleen: Unremarkable. Adrenals/Urinary Tract: 1.2 cm low-attenuation lesion in the upper pole of the right kidney and exophytic 2.4 cm low-attenuation lesion in the upper pole of the left kidney, compatible with simple cysts. Other subcentimeter low-attenuation lesions in both kidneys, too small to definitively characterize, but favored to represent tiny cysts. No hydroureteronephrosis. Urinary bladder is nearly decompressed, but otherwise unremarkable in appearance. Stomach/Bowel: Stomach is unremarkable in appearance. No pathologic dilatation of small bowel or colon. Multiple bowel loops in the left upper quadrant are intimately associated with the large soft tissue mass (discussed below). Colonic diverticulosis, particularly in the sigmoid colon, without definitive evidence to suggest an acute diverticulitis at this   time. Vascular/Lymphatic: Aortic atherosclerosis, without evidence of aneurysm or dissection in the abdominal or pelvic vasculature. Extensive lymphadenopathy noted in the abdomen, most evident in the retroperitoneum where there are bulky para-aortic nodal masses measuring up to 2.3 x 4.1 cm near the left renal artery (axial  image 41 of series 11), 3.2 x 4.6 cm adjacent to the infrarenal abdominal aorta (axial image 48 of series 11), and 3.4 x 3.2 cm adjacent to the aortic bifurcation (axial image 54 of series 11), all of which appear similar to the recent prior study. Bulky celiac axis and gastrohepatic ligament lymph nodes are noted, measuring up to 1.9 cm in short axis (axial image 24 of series 11). Several prominent but nonenlarged pelvic lymph nodes are noted (nonspecific). Reproductive: Prostate gland and seminal vesicles are unremarkable in appearance. Other: Bulky poorly defined soft tissue mass in the left upper quadrant of the abdomen which is intimately associated with multiple adjacent bowel loops, and difficult to distinctly defined, but estimated to measure approximately 15.5 x 13.5 x 13.4 cm (axial image 38 of series 11 and coronal image 53 of series 14). Moderate volume of ascites. Diffuse enhancement of the peritoneal membranes, indicative of widespread peritoneal metastatic disease. Extensive omental caking, best appreciated in the left side of the abdomen where the bulkiest portion of this measures approximately 14.9 x 4.0 cm (axial image 47 of series 11). No pneumoperitoneum. Musculoskeletal: There are no aggressive appearing lytic or blastic lesions noted in the visualized portions of the skeleton. Review of the MIP images confirms the above findings. IMPRESSION: 1. Study is positive for segmental sized pulmonary emboli in the left lung. These appear nonocclusive at this time, and there is no associated pulmonary hemorrhage to suggest frank pulmonary infarct. 2. Bulky soft tissue mass in the left upper quadrant with extensive lymphadenopathy in the chest and abdomen, and evidence of widespread intraperitoneal metastatic disease, as detailed above. 3. Small bilateral pleural effusions lying dependently. 4. Aortic atherosclerosis. 5. Tracheobronchomalacia. 6. Cholelithiasis. 7. Colonic diverticulosis. Electronically  Signed   By: Vinnie Langton M.D.   On: 05/04/2020 12:50   CT Abdomen Pelvis W Contrast  Result Date: 05/07/2020 CLINICAL DATA:  69 year old male with history of shortness of breath for the past few days. Abdominal distension. History of mantle cell lymphoma. EXAM: CT ANGIOGRAPHY CHEST CT ABDOMEN AND PELVIS WITH CONTRAST TECHNIQUE: Multidetector CT imaging of the chest was performed using the standard protocol during bolus administration of intravenous contrast. Multiplanar CT image reconstructions and MIPs were obtained to evaluate the vascular anatomy. Multidetector CT imaging of the abdomen and pelvis was performed using the standard protocol during bolus administration of intravenous contrast. CONTRAST:  180m OMNIPAQUE IOHEXOL 350 MG/ML SOLN COMPARISON:  CT the abdomen and pelvis 04/19/2020. CT the chest 04/19/2020. FINDINGS: CTA CHEST FINDINGS Cardiovascular: Study is limited by patient respiratory motion. However, despite this limitation there are segmental sized filling defects within pulmonary arteries to the left upper and lower lobes best appreciated on axial images 73 and 106 of series 5, and axial image 148 of series 5 respectively, compatible with pulmonary embolism. Heart size is normal. There is no significant pericardial fluid, thickening or pericardial calcification. Aortic atherosclerosis. No definite coronary artery calcifications. Mediastinum/Nodes: Multiple enlarged anterior mediastinal lymph nodes noted inferiorly, immediately above the right hemidiaphragm, measuring up to 2.3 cm in short axis (axial image 13 of series 11), increased compared to the prior study. Prominent but nonenlarged distal paraesophageal lymph node measuring 8 mm in short axis, nonspecific. Circumferential  thickening of the distal third of the esophagus. Mildly enlarged left axillary lymph nodes measuring up to 1.3 cm in short axis. Lungs/Pleura: Collapse of the trachea and mainstem bronchi during expiration,  indicative of tracheobronchomalacia. No acute consolidative airspace disease. Small bilateral pleural effusions lying dependently. No definite suspicious appearing pulmonary nodules or masses are noted. Musculoskeletal: There are no aggressive appearing lytic or blastic lesions noted in the visualized portions of the skeleton. Review of the MIP images confirms the above findings. CT ABDOMEN and PELVIS FINDINGS Hepatobiliary: Multiple well-defined low-attenuation lesions are again noted throughout the liver, similar in size, number and distribution to the prior study, largest of which are compatible with simple cysts measuring up to 5.3 x 3.7 cm in segment 2 and for a (axial image 18 of series 11). Smaller lesions are too small to definitively characterize, but also favored to represent small cysts or biliary hamartomas. No new suspicious hepatic lesions. No intra or extrahepatic biliary ductal dilatation. 6 mm calcified gallstone lying dependently in the gallbladder. Gallbladder is not distended. Pancreas: No pancreatic mass. No pancreatic ductal dilatation. No pancreatic or peripancreatic fluid collections or inflammatory changes. Spleen: Unremarkable. Adrenals/Urinary Tract: 1.2 cm low-attenuation lesion in the upper pole of the right kidney and exophytic 2.4 cm low-attenuation lesion in the upper pole of the left kidney, compatible with simple cysts. Other subcentimeter low-attenuation lesions in both kidneys, too small to definitively characterize, but favored to represent tiny cysts. No hydroureteronephrosis. Urinary bladder is nearly decompressed, but otherwise unremarkable in appearance. Stomach/Bowel: Stomach is unremarkable in appearance. No pathologic dilatation of small bowel or colon. Multiple bowel loops in the left upper quadrant are intimately associated with the large soft tissue mass (discussed below). Colonic diverticulosis, particularly in the sigmoid colon, without definitive evidence to suggest  an acute diverticulitis at this time. Vascular/Lymphatic: Aortic atherosclerosis, without evidence of aneurysm or dissection in the abdominal or pelvic vasculature. Extensive lymphadenopathy noted in the abdomen, most evident in the retroperitoneum where there are bulky para-aortic nodal masses measuring up to 2.3 x 4.1 cm near the left renal artery (axial image 41 of series 11), 3.2 x 4.6 cm adjacent to the infrarenal abdominal aorta (axial image 48 of series 11), and 3.4 x 3.2 cm adjacent to the aortic bifurcation (axial image 54 of series 11), all of which appear similar to the recent prior study. Bulky celiac axis and gastrohepatic ligament lymph nodes are noted, measuring up to 1.9 cm in short axis (axial image 24 of series 11). Several prominent but nonenlarged pelvic lymph nodes are noted (nonspecific). Reproductive: Prostate gland and seminal vesicles are unremarkable in appearance. Other: Bulky poorly defined soft tissue mass in the left upper quadrant of the abdomen which is intimately associated with multiple adjacent bowel loops, and difficult to distinctly defined, but estimated to measure approximately 15.5 x 13.5 x 13.4 cm (axial image 38 of series 11 and coronal image 53 of series 14). Moderate volume of ascites. Diffuse enhancement of the peritoneal membranes, indicative of widespread peritoneal metastatic disease. Extensive omental caking, best appreciated in the left side of the abdomen where the bulkiest portion of this measures approximately 14.9 x 4.0 cm (axial image 47 of series 11). No pneumoperitoneum. Musculoskeletal: There are no aggressive appearing lytic or blastic lesions noted in the visualized portions of the skeleton. Review of the MIP images confirms the above findings. IMPRESSION: 1. Study is positive for segmental sized pulmonary emboli in the left lung. These appear nonocclusive at this time, and there   is no associated pulmonary hemorrhage to suggest frank pulmonary infarct. 2.  Bulky soft tissue mass in the left upper quadrant with extensive lymphadenopathy in the chest and abdomen, and evidence of widespread intraperitoneal metastatic disease, as detailed above. 3. Small bilateral pleural effusions lying dependently. 4. Aortic atherosclerosis. 5. Tracheobronchomalacia. 6. Cholelithiasis. 7. Colonic diverticulosis. Electronically Signed   By: Vinnie Langton M.D.   On: 05/08/2020 12:50   DG CHEST PORT 1 VIEW  Result Date: 04/30/2020 CLINICAL DATA:  Central line placement EXAM: PORTABLE CHEST 1 VIEW COMPARISON:  Radiograph 05/08/2020 FINDINGS: Endotracheal tube tip terminates 2.2 cm from the expected location of the carina. Transesophageal tube tip and side port distal to the GE junction. Right IJ catheter tip terminates in the right atrium. Telemetry leads overlie the chest. Layering bilateral effusions adjacent passive atelectasis. Additional bandlike subsegmental atelectasis in the lungs as well. Stable borderline cardiomegaly accounting for portable technique. No acute osseous or soft tissue abnormality. Degenerative changes are present in the imaged spine and shoulders. IMPRESSION: 1. Endotracheal tube tip terminates 2.2 cm from the expected location of the carina. Could consider retraction 2 cm to the mid trachea. 2. Transesophageal tube tip and side port distal to the GE junction. 3. Right IJ catheter tip terminates in the right atrium. 4. Layering bilateral effusions with adjacent passive atelectasis and vascular congestion. 5. Stable borderline cardiomegaly. Electronically Signed   By: Lovena Le M.D.   On: 04/30/2020 01:14   DG CHEST PORT 1 VIEW  Result Date: 05/03/2020 CLINICAL DATA:  Endotracheal tube placement EXAM: PORTABLE CHEST 1 VIEW COMPARISON:  April 29, 2020 FINDINGS: The endotracheal tube terminates above the carina. The lung volumes are low. Again identified are bilateral pleural effusions with adjacent atelectasis. The heart size is stable. There is no  pneumothorax. The enteric tube terminates below the left hemidiaphragm. IMPRESSION: Lines and tubes as above.  Otherwise, stable exam. Electronically Signed   By: Constance Holster M.D.   On: 05/14/2020 22:04   DG Chest Portable 1 View  Result Date: 04/19/2020 CLINICAL DATA:  Cough, wheezing EXAM: PORTABLE CHEST 1 VIEW COMPARISON:  01/09/2020 FINDINGS: Low lung volumes. Minimal atelectasis or scarring at the left lung base. No pleural effusion or pneumothorax. Heart size is within normal limits for technique. IMPRESSION: Minimal atelectasis or scarring at the left lung base. Electronically Signed   By: Macy Mis M.D.   On: 04/19/2020 12:18   DG Abd Portable 2 Views  Result Date: 04/30/2020 CLINICAL DATA:  Abdominal distension and pain. EXAM: PORTABLE ABDOMEN - 2 VIEW COMPARISON:  04/19/2020 FINDINGS: There is mild gaseous distension of the stomach. No dilated loops of large or small bowel. There is no evidence of free air. No radio-opaque calculi or other significant radiographic abnormality is seen. IMPRESSION: Nonobstructive bowel gas pattern. Electronically Signed   By: Kerby Moors M.D.   On: 05/10/2020 06:12   ECHOCARDIOGRAM COMPLETE  Result Date: 04/20/2020    ECHOCARDIOGRAM REPORT   Patient Name:   DIVON KRABILL Date of Exam: 04/20/2020 Medical Rec #:  [id number]    Height:       64.0 in Accession #:    [id number]   Weight:       210.1 lb Date of Birth:  28-Mar-1951   BSA:          1.998 m Patient Age:    87 years     BP:           156/83 mmHg Patient Gender: M  HR:           88 bpm. Exam Location:  Inpatient Procedure: 2D Echo, Cardiac Doppler and Color Doppler Indications:    Chemo evaluation  History:        Patient has prior history of Echocardiogram examinations, most                 recent 11/14/2019. COPD; Risk Factors:Hypertension and Diabetes.                 Mantle cell lymphoma.  Sonographer:    Dustin Flock Referring Phys: [place]  1.  Left ventricular ejection fraction, by estimation, is 65 to 70%. The left ventricle has normal function. The left ventricle has no regional wall motion abnormalities. Left ventricular diastolic parameters are consistent with Grade I diastolic dysfunction (impaired relaxation).  2. Right ventricular systolic function is normal. The right ventricular size is normal. Tricuspid regurgitation signal is inadequate for assessing PA pressure.  3. The mitral valve is grossly normal. Trivial mitral valve regurgitation. No evidence of mitral stenosis.  4. The aortic valve is tricuspid. There is mild calcification of the aortic valve. There is mild thickening of the aortic valve. Aortic valve regurgitation is not visualized. Mild aortic valve sclerosis is present, with no evidence of aortic valve stenosis.  5. The inferior vena cava is normal in size with greater than 50% respiratory variability, suggesting right atrial pressure of 3 mmHg. Comparison(s): Changes from prior study are noted. EF is now 65-70%. FINDINGS  Left Ventricle: Left ventricular ejection fraction, by estimation, is 65 to 70%. The left ventricle has normal function. The left ventricle has no regional wall motion abnormalities. The left ventricular internal cavity size was normal in size. There is  no left ventricular hypertrophy. Left ventricular diastolic parameters are consistent with Grade I diastolic dysfunction (impaired relaxation). Normal left ventricular filling pressure. Right Ventricle: The right ventricular size is normal. No increase in right ventricular wall thickness. Right ventricular systolic function is normal. Tricuspid regurgitation signal is inadequate for assessing PA pressure. Left Atrium: Left atrial size was normal in size. Right Atrium: Right atrial size was normal in size. Pericardium: Trivial pericardial effusion is present. Presence of pericardial fat pad. Mitral Valve: The mitral valve is grossly normal. Trivial mitral valve  regurgitation. No evidence of mitral valve stenosis. Tricuspid Valve: The tricuspid valve is grossly normal. Tricuspid valve regurgitation is not demonstrated. No evidence of tricuspid stenosis. Aortic Valve: The aortic valve is tricuspid. There is mild calcification of the aortic valve. There is mild thickening of the aortic valve. Aortic valve regurgitation is not visualized. Mild aortic valve sclerosis is present, with no evidence of aortic valve stenosis. Aortic valve mean gradient measures 11.1 mmHg. Aortic valve peak gradient measures 16.7 mmHg. Aortic valve area, by VTI measures 2.42 cm. Pulmonic Valve: The pulmonic valve was grossly normal. Pulmonic valve regurgitation is not visualized. No evidence of pulmonic stenosis. Aorta: The aortic root is normal in size and structure. Venous: The right upper pulmonary vein is normal. The inferior vena cava is normal in size with greater than 50% respiratory variability, suggesting right atrial pressure of 3 mmHg. IAS/Shunts: The atrial septum is grossly normal. Additional Comments: There is a small pleural effusion in the left lateral region.  LEFT VENTRICLE PLAX 2D LVIDd:         4.30 cm  Diastology LVIDs:         2.60 cm  LV e' medial:  8.81 cm/s LV PW:         1.10 cm  LV E/e' medial:  7.2 LV IVS:        1.10 cm  LV e' lateral:   7.51 cm/s LVOT diam:     2.30 cm  LV E/e' lateral: 8.4 LV SV:         92 LV SV Index:   46 LVOT Area:     4.15 cm  RIGHT VENTRICLE RV Basal diam:  3.20 cm RV S prime:     11.00 cm/s TAPSE (M-mode): 2.9 cm LEFT ATRIUM             Index       RIGHT ATRIUM           Index LA diam:        3.30 cm 1.65 cm/m  RA Area:     15.30 cm LA Vol (A2C):   43.2 ml 21.62 ml/m RA Volume:   37.60 ml  18.82 ml/m LA Vol (A4C):   30.7 ml 15.37 ml/m LA Biplane Vol: 36.9 ml 18.47 ml/m  AORTIC VALVE AV Area (Vmax):    2.42 cm AV Area (Vmean):   2.14 cm AV Area (VTI):     2.42 cm AV Vmax:           204.20 cm/s AV Vmean:          159.222 cm/s AV VTI:             0.381 m AV Peak Grad:      16.7 mmHg AV Mean Grad:      11.1 mmHg LVOT Vmax:         119.00 cm/s LVOT Vmean:        82.200 cm/s LVOT VTI:          0.222 m LVOT/AV VTI ratio: 0.58  AORTA Ao Root diam: 3.10 cm MITRAL VALVE MV Area (PHT): 4.06 cm     SHUNTS MV Decel Time: 187 msec     Systemic VTI:  0.22 m MV E velocity: 63.40 cm/s   Systemic Diam: 2.30 cm MV A velocity: 104.00 cm/s MV E/A ratio:  0.61 [place] O'Neal MD Electronically signed by [place] O'Neal MD Signature Date/Time: 04/20/2020/10:32:25 AM    Final    US ASCITES (ABDOMEN LIMITED)  Result Date: 04/19/2020 CLINICAL DATA:  68-year-old male with abdominal distension, ascites EXAM: LIMITED ABDOMEN ULTRASOUND FOR ASCITES TECHNIQUE: Limited ultrasound survey for ascites was performed in all four abdominal quadrants. COMPARISON:  CT abdomen/pelvis 04/19/2020 FINDINGS: Sonographic interrogation of the abdomen was performed in anticipation of paracentesis. Unfortunately, the volume is a ascites is quite small and insufficient for drainage. There is however extensive omental caking and peritoneal nodularity. IMPRESSION: 1. Small volume ascites, insufficient for paracentesis. 2. Extensive omental caking and peritoneal nodularity consistent with peritoneal carcinomatosis. Electronically Signed   By: Heath  McCullough M.D.   On: 04/19/2020 16:53   VAS US LOWER EXTREMITY VENOUS (DVT)  Result Date: 05/01/2020  Lower Venous DVT Study Indications: Pain, Swelling, and Edema.  Risk Factors: Immobility confirmed PE. Performing Technologist: Vernon Matacale RCT RDMS  Examination Guidelines: A complete evaluation includes B-mode imaging, spectral Doppler, color Doppler, and power Doppler as needed of all accessible portions of each vessel. Bilateral testing is considered an integral part of a complete examination. Limited examinations for reoccurring indications may be performed as noted. The reflux portion of the exam is performed with the patient in reverse  Trendelenburg.  +---------+---------------+---------+-----------+----------+--------------+ RIGHT      CompressibilityPhasicitySpontaneityPropertiesThrombus Aging +---------+---------------+---------+-----------+----------+--------------+ CFV      Full           Yes      Yes                                 +---------+---------------+---------+-----------+----------+--------------+ SFJ      Full                                                        +---------+---------------+---------+-----------+----------+--------------+ FV Prox  Full                                                        +---------+---------------+---------+-----------+----------+--------------+ FV Mid   Full                                                        +---------+---------------+---------+-----------+----------+--------------+ FV DistalFull                                                        +---------+---------------+---------+-----------+----------+--------------+ PFV      Full                                                        +---------+---------------+---------+-----------+----------+--------------+ POP      Full           Yes      Yes                                 +---------+---------------+---------+-----------+----------+--------------+ PTV      Full                                                        +---------+---------------+---------+-----------+----------+--------------+ PERO     Full                                                        +---------+---------------+---------+-----------+----------+--------------+   +---------+---------------+---------+-----------+----------+--------------+ LEFT     CompressibilityPhasicitySpontaneityPropertiesThrombus Aging +---------+---------------+---------+-----------+----------+--------------+ CFV      Full           Yes      Yes                                  +---------+---------------+---------+-----------+----------+--------------+  SFJ      Full                                                        +---------+---------------+---------+-----------+----------+--------------+ FV Prox  Full                                                        +---------+---------------+---------+-----------+----------+--------------+ FV Mid   Full                                                        +---------+---------------+---------+-----------+----------+--------------+ FV DistalFull                                                        +---------+---------------+---------+-----------+----------+--------------+ PFV      Full                                                        +---------+---------------+---------+-----------+----------+--------------+ POP      Full           Yes      Yes                                 +---------+---------------+---------+-----------+----------+--------------+ PTV      Full                                                        +---------+---------------+---------+-----------+----------+--------------+ PERO     Full                                                        +---------+---------------+---------+-----------+----------+--------------+     Summary: RIGHT: - There is no evidence of deep vein thrombosis in the lower extremity.  - No cystic structure found in the popliteal fossa.  LEFT: - There is no evidence of deep vein thrombosis in the lower extremity.  - No cystic structure found in the popliteal fossa.  *See table(s) above for measurements and observations. Electronically signed by Todd Early MD on 05/01/2020 at 2:50:45 PM.    Final    ECHOCARDIOGRAM LIMITED  Result Date: 04/30/2020    ECHOCARDIOGRAM LIMITED REPORT   Patient Name:   Apostolos Senn Date of Exam: 04/30/2020 Medical Rec #:  [id number]    Height:         64.0 in Accession #:    [id number]   Weight:       205.0 lb Date  of Birth:  05/13/1951   BSA:          1.977 m Patient Age:    68 years     BP:           114/60 mmHg Patient Gender: M            HR:           109 bpm. Exam Location:  Inpatient Procedure: Limited Echo, Limited Color Doppler and Cardiac Doppler Indications:    Pulmonary Embolus I26.99  History:        Patient has prior history of Echocardiogram examinations, most                 recent 04/20/2020. Mantle cell lymphoma; Risk                 Factors:Hypertension and Diabetes.  Sonographer:    Casey Kirkpatrick RDCS (AE) Referring Phys: [id number] TYRONE A KYLE  Sonographer Comments: Echo performed with patient supine and on artificial respirator. IMPRESSIONS  1. Cystic structure noted in liver on Subcostal images.  2. Left ventricular ejection fraction, by estimation, is 60 to 65%. The left ventricle has normal function. The left ventricle has no regional wall motion abnormalities.  3. Right ventricular systolic function is normal. The right ventricular size is normal.  4. The pericardial effusion is posterior to the left ventricle.  5. The mitral valve is normal in structure. No evidence of mitral valve regurgitation. No evidence of mitral stenosis.  6. The aortic valve is normal in structure. Aortic valve regurgitation is not visualized. No aortic stenosis is present.  7. The inferior vena cava is normal in size with greater than 50% respiratory variability, suggesting right atrial pressure of 3 mmHg. FINDINGS  Left Ventricle: Left ventricular ejection fraction, by estimation, is 60 to 65%. The left ventricle has normal function. The left ventricle has no regional wall motion abnormalities. The left ventricular internal cavity size was normal in size. There is  no left ventricular hypertrophy. Right Ventricle: The right ventricular size is normal. No increase in right ventricular wall thickness. Right ventricular systolic function is normal. Left Atrium: Left atrial size was normal in size. Right Atrium: Right atrial  size was normal in size. Pericardium: Trivial pericardial effusion is present. The pericardial effusion is posterior to the left ventricle. Mitral Valve: The mitral valve is normal in structure. No evidence of mitral valve stenosis. Tricuspid Valve: The tricuspid valve is normal in structure. Tricuspid valve regurgitation is mild . No evidence of tricuspid stenosis. Aortic Valve: The aortic valve is normal in structure. Aortic valve regurgitation is not visualized. No aortic stenosis is present. Pulmonic Valve: The pulmonic valve was normal in structure. Pulmonic valve regurgitation is trivial. No evidence of pulmonic stenosis. Aorta: The aortic root is normal in size and structure. Venous: The inferior vena cava is normal in size with greater than 50% respiratory variability, suggesting right atrial pressure of 3 mmHg. IAS/Shunts: No atrial level shunt detected by color flow Doppler. Additional Comments: Cystic structure noted in liver on Subcostal images. LEFT VENTRICLE PLAX 2D LVIDd:         4.20 cm LVIDs:         2.60 cm LV PW:         1.00 cm LV IVS:        1.10 cm LVOT diam:       2.30 cm LVOT Area:     4.15 cm  RIGHT VENTRICLE RV S prime:     30.80 cm/s TAPSE (M-mode): 1.8 cm LEFT ATRIUM         Index LA diam:    3.00 cm 1.52 cm/m   AORTA Ao Root diam: 3.40 cm  SHUNTS Systemic Diam: 2.30 cm Jenkins Rouge MD Electronically signed by Jenkins Rouge MD Signature Date/Time: 04/30/2020/9:18:44 AM    Final    [place] EKG SITE RITE  Result Date: 04/19/2020 If Site Rite image not attached, placement could not be confirmed due to current cardiac rhythm.   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 CD5+ clonal lymphoproliferative disorder" -04/26/2019 FISH revealed "Positive for p53 (17p13) deletion." -05/31/2019 PET/CT ([id number]) revealed "1. There are  multiple prominent lymph nodes in the neck, both axilla, the retroperitoneum and pelvis with low level hypermetabolic activity consistent with chronic lymphocytic leukemia. Deauville 3 and 4. 2. Mild splenomegaly and mild generalized splenic hypermetabolic activity 3. No evidence of bone marrow involvement." -06/10/2019 Flow Pathology Report ([id number]) revealed "Abnormal B-cell population." -06/10/2019 Surgical Pathology Report ([id number])  revealed "Atypical lymphoid proliferation consistent with mantle cell lymphoma." -09/19/19 PET Skull Base to Thigh ([id number])-- favorable response to current treatment -01/02/2020 PET/CT ([id number]) revealed "1. Continued further decrease in size and FDG accumulation in index cervical, axillary, retroperitoneal, and pelvic lymph nodes. No lymphadenopathy by size criteria today. FDG accumulation in these lymph nodes is compatible with Deauville 2 category. 2. Interval decrease in size with resolution of hypermetabolism associated with the spleen previously."  -04/19/2020 CT abdomen pelvis revealed "1. Dominant finding is new large (15 cm) lymphoid mass within LEFT upper quadrant. Mass is new from PET-CT 01/02/2020. Differential includes massive mesenteric adenopathy versus lymphoma of the small bowel or descending colon. No oral or IV contrast. 2. New periaortic retroperitoneal lymphadenopathy. New precordial Adenopathy. 3. Extensive fluid within the mesentery and omental thickening in LEFT lower quadrant. 4. Free fluid the pelvis with thin enhancing rim suggest peritonitis. 5. No evidence of bowel perforation. No evidence of bowel Obstruction." -05/07/2020 CTA chest and CT abdomen pelvis revealed "1. Study is positive for segmental sized pulmonary emboli in the left lung. These appear nonocclusive at this time, and there is no associated pulmonary hemorrhage to suggest frank pulmonary infarct. 2. Bulky soft tissue mass in the left upper quadrant with extensive  lymphadenopathy in the chest and abdomen, and evidence of widespread intraperitoneal metastatic disease, as detailed above. 3. Small bilateral pleural effusions lying dependently. 4. Aortic atherosclerosis. 5. Tracheobronchomalacia. 6. Cholelithiasis. 7. Colonic diverticulosis."  2) Rigors from Rituxan - needing demerol as premedication. Rituxan rate to be capped at 174m/hour  3) Thrombocytopenia from chemotherapy  4) Neutropenia due to recent chemotherapy  5) anemia  6) significant new abdominal distention due to lymphoma recurrence  7) new hypercalcemia- ?  Related to dehydration and  Lymphoma -resolved at this time  8) acute hypoxic respiratory failure-multifactorial due to abdominal distention, PE, and hypertensive emergency  9) left segmental PE  10) AKI  11) tumor lysis syndrome  12) pancytopenia related to chemotherapy and possible sepsis. 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  He received 1 cycle of CHOP with no improvement in his left upper quadrant soft tissue mass and abdominal distention at this time. --Radiation oncology following for  for consideration of palliative radiation to his left upper quadrant mass. -I have spoken with our oral chemotherapy pharmacist and we have acalabrutinib available and ready to administer once the patient is stabilized and blood counts are rebounding. -The patient has received G-CSF already and recommend close monitoring of his CBC with differential.  - Transfuse for hemoglobin less than 8 and platelets less than 30,000 or active bleeding.  I have ordered 1 unit of PRBCs today. -Monitor for bleeding on anticoagulation. -Antibiotics per primary team. -Received rasburicase on 05/01/2020 for hyperuricemia Daily uric acid checks. -Consult nephrology as needed for AKI.   LOS: 4 days   Sullivan Lone

## 2020-05-03 NOTE — Evaluation (Signed)
Clinical/Bedside Swallow Evaluation Patient Details  Name: Dean Dixon MRN: [id number] Date of Birth: 05/15/1951  Today's Date: 05/03/2020 Time: SLP Start Time (ACUTE ONLY): 17 SLP Stop Time (ACUTE ONLY): 1440 SLP Time Calculation (min) (ACUTE ONLY): 25 min  Past Medical History:  Past Medical History:  Diagnosis Date  . Cancer ([place])    Mantle Cell Lymphoma  . Diabetes mellitus without complication ([place])   . Hypertension    Past Surgical History:  Past Surgical History:  Procedure Laterality Date  . APPENDECTOMY    . TONSILLECTOMY     HPI:  Dean Dixon is a 69 y.o. male with medical history significant of mantle cell lymphoma on CHOP. Presenting with dyspnea and ab distention. Reports 2 weeks of shortness of breath that has worsened in the last 2 days. ETT 11/7-11/9.    Assessment / Plan / Recommendation Clinical Impression  Pt with consisent coughing, throat clearing and intermittent wet vocal quality across isolated PO consistencies this date including thin, nectar thick, honey thick, and puree trials concerning for poor airway protection. Cough noted to be congested weaker. Given high concern for aspiration and medical comorbidities recommend proceed with instrumental assessment to further assess swallow efficiency and safety. Continue with NPO and plan for MBSS next date.   SLP Visit Diagnosis: Dysphagia, unspecified (R13.10)    Aspiration Risk  Moderate aspiration risk;Severe aspiration risk;Risk for inadequate nutrition/hydration    Diet Recommendation   NPO, MBSS next date  Medication Administration: Via alternative means    Other  Recommendations Oral Care Recommendations: Oral care QID   Follow up Recommendations        Frequency and Duration min 2x/week  1 week       Prognosis Prognosis for Safe Diet Advancement: Fair Barriers to Reach Goals: Time post onset      Swallow Study   General Date of Onset: 04/27/2020 HPI: Dean Dixon is a 70 y.o. male  with medical history significant of mantle cell lymphoma on CHOP. Presenting with dyspnea and ab distention. Reports 2 weeks of shortness of breath that has worsened in the last 2 days. ETT 11/7-11/9.  Type of Study: Bedside Swallow Evaluation Previous Swallow Assessment: none on file Diet Prior to this Study: NPO Temperature Spikes Noted: No Respiratory Status: Room air History of Recent Intubation: Yes Behavior/Cognition: Alert;Cooperative Oral Cavity Assessment: Dry Oral Care Completed by SLP: Yes Oral Cavity - Dentition: Adequate natural dentition Vision: Functional for self-feeding Self-Feeding Abilities: Needs assist Patient Positioning: Upright in bed Baseline Vocal Quality: Low vocal intensity Volitional Cough: Weak;Wet;Congested Volitional Swallow: Unable to elicit    Oral/Motor/Sensory Function Overall Oral Motor/Sensory Function: Generalized oral weakness   Ice Chips Ice chips: Impaired Presentation: Spoon Oral Phase Impairments: Reduced lingual movement/coordination Oral Phase Functional Implications: Prolonged oral transit Pharyngeal Phase Impairments: Suspected delayed Swallow;Decreased hyoid-laryngeal movement;Multiple swallows;Cough - Immediate;Cough - Delayed;Throat Clearing - Delayed;Throat Clearing - Immediate   Thin Liquid Thin Liquid: Impaired Presentation: Cup Pharyngeal  Phase Impairments: Suspected delayed Swallow;Multiple swallows;Decreased hyoid-laryngeal movement;Cough - Delayed;Cough - Immediate;Wet Vocal Quality;Throat Clearing - Delayed    Nectar Thick Nectar Thick Liquid: Impaired Presentation: Cup Oral phase functional implications: Prolonged oral transit Pharyngeal Phase Impairments: Suspected delayed Swallow;Decreased hyoid-laryngeal movement;Multiple swallows;Wet Vocal Quality;Throat Clearing - Delayed;Throat Clearing - Immediate;Cough - Delayed   Honey Thick Honey Thick Liquid: Impaired Presentation: Cup Oral Phase Functional Implications:  Prolonged oral transit Pharyngeal Phase Impairments: Suspected delayed Swallow;Decreased hyoid-laryngeal movement;Multiple swallows;Wet Vocal Quality;Cough - Delayed;Cough - Immediate;Throat Clearing - Delayed   Puree Puree:  Impaired Presentation: Spoon Oral Phase Functional Implications: Prolonged oral transit Pharyngeal Phase Impairments: Suspected delayed Swallow;Multiple swallows;Throat Clearing - Immediate;Throat Clearing - Delayed;Cough - Delayed   Solid     Solid: Not tested      Cherisse Carrell E Tyonna Talerico MA, CCC-SLP Acute Rehabilitation Services 05/03/2020,2:56 PM

## 2020-05-03 NOTE — Progress Notes (Signed)
Pharmacy Antibiotic Note  Dean Dixon is a 69 y.o. male admitted on 05/04/2020 with sepsis.  Pharmacy has been consulted for Cefepime dosing.  D#4 Cefepime Tc 97.5 SCr elevated but improved 2.1, CrCl ~ 33 ml/min WBC low 2.4, ANC 1.1 (udenyca 11/2)  Plan: Increase Cefepime 2g IV q12h Follow up renal function, culture results, and clinical course.   Height: 5\' 4"  (162.6 cm) Weight: 93.2 kg (205 lb 7.5 oz) IBW/kg (Calculated) : 59.2  Temp (24hrs), Avg:97.9 F (36.6 C), Min:97.5 F (36.4 C), Max:98.4 F (36.9 C)  Recent Labs  Lab 04/24/2020 0503 04/27/2020 0503 05/02/2020 0606 04/27/2020 2315 04/30/20 0430 04/30/20 0532 04/30/20 1423 05/01/20 0500 05/02/20 0600 05/03/20 0115 05/03/20 1131  WBC 4.3   < >  --  2.5* 1.7*  --   --  1.9* 1.8* 2.4*  --   CREATININE 1.11   < >  --   --  1.76*  --  2.10* 1.90* 3.10*  --  2.10*  LATICACIDVEN 3.2*  --  2.3*  --   --  1.9  --   --   --   --   --    < > = values in this interval not displayed.    Estimated Creatinine Clearance: 33.7 mL/min (A) (by C-G formula based on SCr of 2.1 mg/dL (H)).    Allergies  Allergen Reactions  . Ivp Dye [Iodinated Diagnostic Agents] Shortness Of Breath    "allergic to CT scan dye", took benadryl with last scan and tolerated well    Antimicrobials this admission: 11/8 cefepime >>   Dose adjustments this admission: 11/10 decrease cefepime 2g q24h for rising SCr 11/11 increase back to q12h for improved SCr  Microbiology results: 11/9 BCx: ngtd 11/7 MRSA PCR: negative 11/7 Resp panel: covid neg; influenza A/B neg  Thank you for allowing pharmacy to be a part of this patient's care.  Peggyann Juba, PharmD, BCPS [facility] main pharmacy [phone] 05/03/2020 1:37 PM

## 2020-05-03 NOTE — Progress Notes (Signed)
ANTICOAGULATION CONSULT NOTE - Follow Up Consult  Pharmacy Consult for Heparin Indication: pulmonary embolus  Allergies  Allergen Reactions   Ivp Dye [Iodinated Diagnostic Agents] Shortness Of Breath    "allergic to CT scan dye", took benadryl with last scan and tolerated well    Patient Measurements: Height: 5\' 4"  (162.6 cm) Weight: 93.2 kg (205 lb 7.5 oz) IBW/kg (Calculated) : 59.2 Heparin Dosing Weight: 81 kg  Vital Signs: Temp: 98.1 F (36.7 C) (11/11 0700) Temp Source: Bladder (11/11 0300) BP: 129/67 (11/11 0700) Pulse Rate: 95 (11/11 0700)  Labs: Recent Labs    04/30/20 1423 04/30/20 1830 05/01/20 0500 05/01/20 1447 05/02/20 0600 05/02/20 0803 05/02/20 1645 05/03/20 0100 05/03/20 0115 05/03/20 1131  HGB  --    < > 7.0*  --  5.5*   < > 7.8*  --  7.4*  --   HCT  --    < > 22.4*  --  17.6*  --  24.0*  --  22.6*  --   PLT  --   --  54*  --  45*  --   --   --  36*  --   HEPARINUNFRC  --    < > 0.64   < >  --    < > 0.42 0.19*  --  0.35  CREATININE 2.10*  --  1.90*  --  3.10*  --   --   --   --   --    < > = values in this interval not displayed.    Estimated Creatinine Clearance: 22.8 mL/min (A) (by C-G formula based on SCr of 3.1 mg/dL (H)).   Medications:  Infusions:   sodium chloride Stopped (05/03/20 0253)   ceFEPime (MAXIPIME) IV Stopped (05/02/20 2140)   heparin 1,250 Units/hr (05/03/20 0158)    Assessment: Patient is a 69 y.o M with stage IV mantle cell lymphoma on chemotherapy (got CHOP on 04/21/20), presented to the ED on 11/7 with c/o SOB, groin swelling and emesis.  Chest CT showed acute PE.  Pharmacy is consulted to start heparin drip for VTE.  Today, 05/03/2020: Heparin level now therapeutic after heparin rate increased to 1250 units/hr CBC: Hgb low 7.4 but somewhat stable after transfusion yesterday; Plts further decreased 36k RN reports no further bleeding nor interruptions in therapy  Goal of Therapy:  Heparin level 0.3-0.7  units/ml Monitor platelets by anticoagulation protocol: Yes   Plan:   Continue heparin IV infusion at 1250 units/hr  Heparin level in 8 hours to verify therapeutic  Daily heparin level and CBC  Continue to monitor s/s bleeding.  Peggyann Juba, PharmD, BCPS Pharmacy: [phone] 05/03/2020 11:55 AM

## 2020-05-03 NOTE — Progress Notes (Signed)
NAME:  Dean Dixon, MRN:  [id number], DOB:  [birth year], LOS: 4 ADMISSION DATE:  05/01/2020, CONSULTATION DATE:  11/7/201 REFERRING MD:  TRH, CHIEF COMPLAINT:  Respiratory distress  Brief History   69 year old male with stage IV mantle cell lymphoma on CHOP presenting with N/V and progressive SOB over the last 2 weeks found to have acute left segmental PE.  Admitted to [facility] however developed hypertension 250/100 with respiratory distress with chest pain, unable to tolerate BiPAP and was intubated.      History of present illness   HPI obtained from medical chart review as patient is currently intubated and sedated on mechanical ventilation.  69 year old male with prior medical history of stage IV mantle cell lymphoma (diagnosed 06/20/2019) on CHOP, hypertension, diabetes type 2 who presented to [facility] on 11/7 with progressive shortness of breath over the last 2 weeks, worse over the last 2 days with above baseline abdominal distention, non bloody nausea/ vomiting, nonradiating intermittent midsternal chest tightness, and cough with lightly productive greenish nonbloody sputum.  Additionally reported ongoing swelling in his legs and testicles causing difficulty with urination.  Recent hospitalization 10/28 - 11/1 found to have new left upper quadrant abdominal mass and hypercalcemia.    In ER, he was afebrile, hypertensive, mildly tachypneic, and tachycardic with oxygen saturations of 98% on room air.  Noted to have multiple episodes of vomiting and retching treated with zofran.  KUB with non-obstructive gas pattern.   Labs noted for glucose 290 with HgbA1c 6.3, BUN 40, AG 17, lactic acid 3.2-> 2.3, troponin hs 52, BNP 133, D-dimer 2.47, Hgb 11.5/ HCT 35.5, platelets 100, neg COVID/ flu, UA noted turbid urine with 5 ketones, negative for leukocytes/ nitrates.  CXR showed small bilateral pleural effusion with atelectasis and low lung volumes.  He was pretreated given his contrast allergy. CTA PE and  CT abd/ pelvis showed segmental nonocclusive left pulmonary embolism without associated hemorrhage or pulmonary infarct.  Additionally noted bulky soft tissue mass in LUQ with extensive lymphadenopathy in the chest and abd with evidence of widespread intraperitoneal metastatic disease, small bilateral pleural effusion, and  tracheobronchomalacia. Started on heparin gtt per pharmacy.   He was admitted by [facility] to progressive care.  In unit, he was noted to become hypertensive with BP 250/100 with complaints of chest discomfort with noted tachypnea and lethargy.  He was treated with hydralazine and placed on nitro gtt for blood pressure management.  Repeated EKG was non acute.  ABG on NRB noted 7.291/ 56.7/ 275.  Bipap was attempted however patient kept pulling it off and therefore given concern for impending respiratory failure, he was intubated by anesthesia.  PCCM consulted for further management.   Past Medical History  Mantle cell lymphoma on CHOP, DM, HTN  Significant Hospital Events   11/07 Admitted to [facility] decompensated/ intubated 11/08 Weaned on PSV, lasix with 2.2L UOP   Consults:  PCCM 11/7  Procedures:  ETT 11/7 >>  Significant Diagnostic Tests:  11/7 KUB >> nonobstructive gas pattern 11/7 CTA PE/  CT abd/ pelvis >> Study is positive for segmental sized pulmonary emboli in the left lung. These appear nonocclusive at this time, and there is no associated pulmonary hemorrhage to suggest frank pulmonary infarct. Bulky soft tissue mass in the left upper quadrant with extensive lymphadenopathy in the chest and abdomen, and evidence of widespread intraperitoneal metastatic disease. Small bilateral pleural effusions lying dependently. Aortic atherosclerosis.  Tracheobronchomalacia. Cholelithiasis. Colonic diverticulosis. 11/8 [place] Ascites/ABD >> small volume ascites,  insufficient for paracentesis, extensive omental caking and peritoneal nodularity consistent with peritoneal carcinomatosis  11/8  ECHO >> cystic structure noted in liver on subcostal images, LVEF 60-65%, no RWMA, RV systolic function normal, pericardial effusion is posterior to LV 11/8 LE Doppler >>   Micro Data:  SARS 2/ flu 11/7 >> neg MRSA 11/7 >> neg UA 11/7 >> negative, 5 ketones  Antimicrobials:  Previously on azithro/ ceftriaxone, stopped 10/30 Cefepime 11/8 >>   Interim history/subjective:  Fever is improved Leukocytosis improving Continues on antibiotics  Objective   Blood pressure 129/67, pulse 95, temperature 98.1 F (36.7 C), resp. rate (!) 26, height 5\' 4"  (1.626 m), weight 93.2 kg, SpO2 96 %.        Intake/Output Summary (Last 24 hours) at 05/03/2020 1156 Last data filed at 05/03/2020 0500 Gross per 24 hour  Intake 845.42 ml  Output 2005 ml  Net -1159.58 ml   Filed Weights   05/20/2020 1826 04/30/20 0315 05/03/20 0700  Weight: 94.9 kg 93 kg 93.2 kg   Physical Exam: General: Appears comfortable HEENT: Dry oral mucosa Neuro: More interactive today CV: S1/S2 appreciated PULM: Decreased air movement bilaterally GI: Protuberant, bowel sounds appreciated Extremities: Bilateral lower extremity edema  Resolved Hospital Problem list    Assessment & Plan:  Metastatic mantle cell lymphoma on chemotherapy Acute pulmonary embolism -Respiratory status does appear more stable today Acute hypoxic respiratory failure -We will use BiPAP as needed  Left segmental pulmonary embolism in the context of active malignancy -Continue anticoagulation  Fever, leukopenia -Fever trend is improving -Continue cefepime  Possible GI bleed -transfused  Mild DKA Type 2 diabetes -We will continue SSI  Acute kidney injury -Avoid nephrotoxic's -Hold diuretics -Continue to trend electrolytes  Pancytopenia -Suspect in the setting of chemotherapy -Heparin GTT -May have to stop heparin if active bleeding -Does not have evidence of ongoing active bleeding  Stage IV mantle cell lymphoma with LUQ  mass/ extensive metastatic disease Last CHOP 10/30, follows with Dr. Irene Limbo -appreciate ONC evaluation  -Plan is for radiation treatment when more stable-evaluated for radiation treatment May 02, 2020 -palliative care consult    Best practice:  Diet: NPO Pain/Anxiety/Delirium protocol (if indicated): PAD protocol  VAP protocol (if indicated): Yes DVT prophylaxis: Systemic heparin GI prophylaxis: PPI Glucose control: CBG q4h Mobility: BR Code Status: Full code Family Communication: Spoke with POA on the phone today  disposition: ICU  Labs   CBC: Recent Labs  Lab 04/28/2020 0503 04/30/2020 2146 05/18/2020 2315 05/15/2020 2315 04/30/20 0430 05/01/20 0500 05/02/20 0600 05/02/20 1645 05/03/20 0115  WBC 4.3  --  2.5*  --  1.7* 1.9* 1.8*  --  2.4*  NEUTROABS 0.7*  --   --   --   --   --   --   --  1.1*  HGB 11.5*   < > 9.6*   < > 8.3* 7.0* 5.5* 7.8* 7.4*  HCT 35.5*   < > 30.1*   < > 25.9* 22.4* 17.6* 24.0* 22.6*  MCV 86.8  --  88.0  --  88.7 91.4 92.6  --  89.7  PLT 100*  --  82*  --  69* 54* 45*  --  36*   < > = values in this interval not displayed.    Basic Metabolic Panel: Recent Labs  Lab 04/24/2020 0503 05/10/2020 0503 05/07/2020 2146 04/30/20 0430 04/30/20 1423 05/01/20 0500 05/02/20 0600  NA 139   < > 140 142 143 147* 143  K 4.8   < >  5.0 5.9* 5.1 4.9 6.1*  CL 99  --   --  107 109 110 109  CO2 23  --   --  24 23 26  19*  GLUCOSE 290*  --   --  236* 203* 143* 255*  BUN 40*  --   --  57* 66* 67* 93*  CREATININE 1.11  --   --  1.76* 2.10* 1.90* 3.10*  CALCIUM 10.6*  --   --  8.2* 7.7* 7.9* 6.9*   < > = values in this interval not displayed.   GFR: Estimated Creatinine Clearance: 22.8 mL/min (A) (by C-G formula based on SCr of 3.1 mg/dL (H)). Recent Labs  Lab 05/20/2020 0503 05/03/2020 0606 04/27/2020 2315 04/30/20 0430 04/30/20 0532 05/01/20 0500 05/02/20 0600 05/03/20 0115  WBC 4.3  --    < > 1.7*  --  1.9* 1.8* 2.4*  LATICACIDVEN 3.2* 2.3*  --   --  1.9  --    --   --    < > = values in this interval not displayed.    Liver Function Tests: Recent Labs  Lab 05/19/2020 0503 04/30/20 0430  AST 19 19  ALT 18 17  ALKPHOS 108 70  BILITOT 0.9 0.4  PROT 5.9* 5.0*  ALBUMIN 3.8 3.0*   No results for input(s): LIPASE, AMYLASE in the last 168 hours. No results for input(s): AMMONIA in the last 168 hours.  ABG    Component Value Date/Time   PHART 7.438 05/02/2020 0755   PCO2ART 32.9 05/02/2020 0755   PO2ART 110 (H) 05/02/2020 0755   HCO3 21.9 05/02/2020 0755   TCO2 29 04/28/2020 2146   ACIDBASEDEF 1.7 05/02/2020 0755   O2SAT 98.7 05/02/2020 0755     Coagulation Profile: No results for input(s): INR, PROTIME in the last 168 hours.  Cardiac Enzymes: No results for input(s): CKTOTAL, CKMB, CKMBINDEX, TROPONINI in the last 168 hours.  HbA1C: Hgb A1c MFr Bld  Date/Time Value Ref Range Status  04/26/2020 05:49 PM 6.3 (H) 4.8 - 5.6 % Final    Comment:    (NOTE) Pre diabetes:          5.7%-6.4%  Diabetes:              >6.4%  Glycemic control for   <7.0% adults with diabetes   11/14/2019 06:13 AM 5.4 4.8 - 5.6 % Final    Comment:    (NOTE) Pre diabetes:          5.7%-6.4% Diabetes:              >6.4% Glycemic control for   <7.0% adults with diabetes     CBG: Recent Labs  Lab 05/02/20 1927 05/02/20 2349 05/03/20 0349 05/03/20 0731 05/03/20 1137  GLUCAP 141* 173* 150* 135* 140*   The patient is critically ill with multiple organ systems failure and requires high complexity decision making for assessment and support, frequent evaluation and titration of therapies, application of advanced monitoring technologies and extensive interpretation of multiple databases. Critical Care Time devoted to patient care services described in this note independent of APP/resident time (if applicable)  is 30 minutes.   Sherrilyn Rist MD [place] [facility] Personal pager: [phone] If unanswered, please page CCM On-call:  [phone]

## 2020-05-03 NOTE — Progress Notes (Signed)
Oncology short note  Called and had a detailed discussion about current status, diagnosis, prognosis and goals of care with patients HCPOA Bernadette Hoit today. He notes that he is on the way to the hospital from [place], [place].  Sullivan Lone MD MS

## 2020-05-03 NOTE — Progress Notes (Signed)
Physical Therapy Evaluation Patient Details Name: Dean Dixon MRN: [id number] DOB: [birth year] Today's Date: 05/03/2020   History of Present Illness  69 year old male with stage IV mantle cell lymphoma presenting 05/11/2020 with N/V and progressive SOB found to have acute left segmental PE. Developed hypertension 250/100 with respiratory distress with chest pain, unable to tolerate BiPAP and was intubated, Extubated 11/9.  Clinical Impression  The patient is awake, follows simple commands. Patient is very weak in speech and LE's. Patient is able to move both legs in bed. Did not get to sit up on bed edge due to need for bed change and  Multiple rolls. PTA, patient was independently living and ambulatory. Was [place] 11/1 from [facility] ambulating.   Pt admitted with above diagnosis.  Pt currently with functional limitations due to the deficits listed below (see PT Problem List). Pt will benefit from skilled PT to increase their independence and safety with mobility to allow discharge to the venue listed below.    Patient on Ra with SPO2 >94%.      Follow Up Recommendations SNF;CIR    Equipment Recommendations   (TBA)    Recommendations for Other Services       Precautions / Restrictions Precautions Precautions: Fall Precaution Comments: incontinence, flexiseal      Mobility  Bed Mobility Overal bed mobility: Needs Assistance Bed Mobility: Rolling Rolling: Mod assist;+2 for safety/equipment         General bed mobility comments: rolled to each side for bed to bed changed, difficulty reaching for the rails, arms ar weak.    Transfers                 General transfer comment: Placed in bed/chair position , did not attempt to sit up  after  bed mobility activity.  Ambulation/Gait             General Gait Details: NT  Stairs            Wheelchair Mobility    Modified Rankin (Stroke Patients Only)       Balance                                              Pertinent Vitals/Pain Pain Assessment: Faces Faces Pain Scale: Hurts a little bit Pain Location: when rolling Pain Descriptors / Indicators: Grimacing;Discomfort Pain Intervention(s): Monitored during session    Home Living Family/patient expects to be discharged to:: Private residence Living Arrangements: Alone   Type of Home: House Home Access: (P) Stairs to enter     Home Layout: One level Home Equipment: None      Prior Function Level of Independence: Independent         Comments: was ambulatory 11/1 when DC'd from [facility]     Hand Dominance   Dominant Hand: Right    Extremity/Trunk Assessment   Upper Extremity Assessment Upper Extremity Assessment: Defer to OT evaluation    Lower Extremity Assessment Lower Extremity Assessment: Generalized weakness (patient with noted jerking of the  legs when lift s from bed.)    Cervical / Trunk Assessment Cervical / Trunk Assessment:  (NT)  Communication   Communication:  (speech is slurred and soft)  Cognition Arousal/Alertness: Awake/alert Behavior During Therapy: WFL for tasks assessed/performed Overall Cognitive Status: Impaired/Different from baseline Area of Impairment: Orientation;Awareness  Orientation Level: Time;Situation     Following Commands: Follows one step commands inconsistently   Awareness: Emergent   General Comments: patient slow to respond,      General Comments General comments (skin integrity, edema, etc.): NT , did sit at midline when [facility] raised maximally    Exercises     Assessment/Plan    PT Assessment Patient needs continued PT services  PT Problem List Decreased strength;Decreased cognition;Decreased knowledge of use of DME;Decreased activity tolerance;Decreased safety awareness;Decreased balance;Decreased knowledge of precautions;Decreased mobility       PT Treatment Interventions Functional mobility training;Therapeutic  activities;Therapeutic exercise;Patient/family education;Cognitive remediation    PT Goals (Current goals can be found in the Care Plan section)  Acute Rehab PT Goals Patient Stated Goal: agreed to mobilize with therapy PT Goal Formulation: With patient Time For Goal Achievement: 05/17/20 Potential to Achieve Goals:  (guarded, dep. on prognosis)    Frequency Min 2X/week   Barriers to discharge Decreased caregiver support      Co-evaluation PT/OT/SLP Co-Evaluation/Treatment: Yes Reason for Co-Treatment: For patient/therapist safety PT goals addressed during session: Mobility/safety with mobility         AM-PAC PT "6 Clicks" Mobility  Outcome Measure Help needed turning from your back to your side while in a flat bed without using bedrails?: A Lot Help needed moving from lying on your back to sitting on the side of a flat bed without using bedrails?: Total Help needed moving to and from a bed to a chair (including a wheelchair)?: Total Help needed standing up from a chair using your arms (e.g., wheelchair or bedside chair)?: Total Help needed to walk in hospital room?: Total Help needed climbing 3-5 steps with a railing? : Total 6 Click Score: 7    End of Session   Activity Tolerance: Patient limited by fatigue Patient left: in bed;with call bell/phone within reach;with nursing/sitter in room Nurse Communication: Mobility status PT Visit Diagnosis: Difficulty in walking, not elsewhere classified (R26.2)    Time: 0156-1537 PT Time Calculation (min) (ACUTE ONLY): 35 min   Charges:   PT Evaluation $PT Eval Moderate Complexity: [place] Pager [phone] Office [phone]   Claretha Cooper 05/03/2020, 1:46 PM

## 2020-05-03 NOTE — Progress Notes (Signed)
ANTICOAGULATION CONSULT NOTE - Follow Up Consult  Pharmacy Consult for Heparin Indication: pulmonary embolus  Allergies  Allergen Reactions  . Ivp Dye [Iodinated Diagnostic Agents] Shortness Of Breath    "allergic to CT scan dye", took benadryl with last scan and tolerated well    Patient Measurements: Height: 5\' 4"  (162.6 cm) Weight: 93 kg (205 lb 0.4 oz) IBW/kg (Calculated) : 59.2 Heparin Dosing Weight: 81 kg  Vital Signs: Temp: 97.9 F (36.6 C) (11/11 0000) Temp Source: Bladder (11/10 2200) BP: 115/54 (11/11 0000) Pulse Rate: 92 (11/11 0000)  Labs: Recent Labs    04/30/20 0430 04/30/20 1029 04/30/20 1423 04/30/20 1830 05/01/20 0500 05/01/20 1447 05/02/20 0600 05/02/20 0803 05/02/20 1645 05/03/20 0100  HGB 8.3*  --   --   --  7.0*  --  5.5*  --  7.8*  --   HCT 25.9*  --   --   --  22.4*  --  17.6*  --  24.0*  --   PLT 69*  --   --   --  54*  --  45*  --   --   --   HEPARINUNFRC  --    < >  --    < > 0.64   < >  --  0.84* 0.42 0.19*  CREATININE 1.76*  --  2.10*  --  1.90*  --  3.10*  --   --   --    < > = values in this interval not displayed.    Estimated Creatinine Clearance: 22.8 mL/min (A) (by C-G formula based on SCr of 3.1 mg/dL (H)).   Medications:  Infusions:  . sodium chloride 20 mL/hr at 05/02/20 1650  . ceFEPime (MAXIPIME) IV Stopped (05/02/20 2140)  . heparin      Assessment: Patient is a 69 y.o M with stage IV mantle cell lymphoma on chemotherapy (got CHOP on 04/21/20), presented to the ED on 11/7 with c/o SOB, groin swelling and emesis.  Chest CT showed acute PE.  Pharmacy is consulted to start heparin drip for VTE.  Today, 05/03/2020: Heparin level 0.19 (subtherapeutic) on heparin at 1000 units/hr CBC:  In process  RN reports no further bleeding nor interruptions in therapy  Goal of Therapy:  Heparin level 0.3-0.7 units/ml Monitor platelets by anticoagulation protocol: Yes   Plan:   Increase heparin IV infusion to 1250  units/hr  Heparin level in 8 hours after rate change  Daily heparin level and CBC  Continue to monitor s/s bleeding.   Leone Haven, PharmD 05/03/2020 1:51 AM

## 2020-05-04 ENCOUNTER — Inpatient Hospital Stay (HOSPITAL_COMMUNITY): Payer: Medicare Other

## 2020-05-04 ENCOUNTER — Ambulatory Visit
Admit: 2020-05-04 | Discharge: 2020-05-04 | Disposition: A | Discharge status: Home / Self Care | Payer: Medicare Other | Attending: Radiation Oncology | Admitting: Radiation Oncology

## 2020-05-04 DIAGNOSIS — J9601 Acute respiratory failure with hypoxia: Secondary | ICD-10-CM | POA: Diagnosis not present

## 2020-05-04 DIAGNOSIS — C8318 Mantle cell lymphoma, lymph nodes of multiple sites: Secondary | ICD-10-CM | POA: Diagnosis not present

## 2020-05-04 DIAGNOSIS — D61810 Antineoplastic chemotherapy induced pancytopenia: Secondary | ICD-10-CM | POA: Diagnosis not present

## 2020-05-04 DIAGNOSIS — I2699 Other pulmonary embolism without acute cor pulmonale: Secondary | ICD-10-CM | POA: Diagnosis not present

## 2020-05-04 DIAGNOSIS — C8310 Mantle cell lymphoma, unspecified site: Secondary | ICD-10-CM | POA: Diagnosis not present

## 2020-05-04 DIAGNOSIS — T451X5A Adverse effect of antineoplastic and immunosuppressive drugs, initial encounter: Secondary | ICD-10-CM | POA: Diagnosis not present

## 2020-05-04 LAB — BPAM RBC
Blood Product Expiration Date: 202111302359
Blood Product Expiration Date: 202112042359
Blood Product Expiration Date: 202112132359
Blood Product Expiration Date: 202112162359
ISSUE DATE / TIME: 202111100754
ISSUE DATE / TIME: 202111101052
ISSUE DATE / TIME: 202111111346
Unit Type and Rh: 9500
Unit Type and Rh: 9500
Unit Type and Rh: 9500
Unit Type and Rh: 9500

## 2020-05-04 LAB — CBC
HCT: 29.2 % — ABNORMAL LOW (ref 39.0–52.0)
Hemoglobin: 9.4 g/dL — ABNORMAL LOW (ref 13.0–17.0)
MCH: 29.6 pg (ref 26.0–34.0)
MCHC: 32.2 g/dL (ref 30.0–36.0)
MCV: 91.8 fL (ref 80.0–100.0)
Platelets: 39 10*3/uL — ABNORMAL LOW (ref 150–400)
RBC: 3.18 MIL/uL — ABNORMAL LOW (ref 4.22–5.81)
RDW: 16.8 % — ABNORMAL HIGH (ref 11.5–15.5)
WBC: 3.9 10*3/uL — ABNORMAL LOW (ref 4.0–10.5)
nRBC: 4.6 % — ABNORMAL HIGH (ref 0.0–0.2)

## 2020-05-04 LAB — BASIC METABOLIC PANEL
Anion gap: 15 (ref 5–15)
BUN: 88 mg/dL — ABNORMAL HIGH (ref 8–23)
CO2: 23 mmol/L (ref 22–32)
Calcium: 7.2 mg/dL — ABNORMAL LOW (ref 8.9–10.3)
Chloride: 119 mmol/L — ABNORMAL HIGH (ref 98–111)
Creatinine, Ser: 1.97 mg/dL — ABNORMAL HIGH (ref 0.61–1.24)
GFR, Estimated: 36 mL/min — ABNORMAL LOW (ref >60)
Glucose, Bld: 138 mg/dL — ABNORMAL HIGH (ref 70–99)
Potassium: 3.7 mmol/L (ref 3.5–5.1)
Sodium: 157 mmol/L — ABNORMAL HIGH (ref 135–145)

## 2020-05-04 LAB — GLUCOSE, CAPILLARY
Glucose-Capillary: 126 mg/dL — ABNORMAL HIGH (ref 70–99)
Glucose-Capillary: 139 mg/dL — ABNORMAL HIGH (ref 70–99)
Glucose-Capillary: 141 mg/dL — ABNORMAL HIGH (ref 70–99)
Glucose-Capillary: 147 mg/dL — ABNORMAL HIGH (ref 70–99)
Glucose-Capillary: 172 mg/dL — ABNORMAL HIGH (ref 70–99)
Glucose-Capillary: 173 mg/dL — ABNORMAL HIGH (ref 70–99)

## 2020-05-04 LAB — TYPE AND SCREEN
ABO/RH(D): O NEG
Antibody Screen: NEGATIVE
Unit division: 0
Unit division: 0
Unit division: 0
Unit division: 0

## 2020-05-04 LAB — RASBURICASE - URIC ACID: Uric Acid, Serum: 5 mg/dL (ref 3.7–8.6)

## 2020-05-04 LAB — URIC ACID: Uric Acid, Serum: 5.7 mg/dL (ref 3.7–8.6)

## 2020-05-04 LAB — HEPARIN LEVEL (UNFRACTIONATED): Heparin Unfractionated: 0.54 IU/mL (ref 0.30–0.70)

## 2020-05-04 MED ORDER — COLLAGENASE 250 UNIT/GM EX OINT
TOPICAL_OINTMENT | Freq: Every day | CUTANEOUS | Status: AC
  Administered 2020-05-17: 1 via TOPICAL
  Filled 2020-05-04 (×2): qty 30

## 2020-05-04 MED ORDER — DEXTROSE 5 % IV SOLN: INTRAVENOUS | Status: DC

## 2020-05-04 NOTE — Progress Notes (Signed)
ANTICOAGULATION CONSULT NOTE - Follow Up Consult  Pharmacy Consult for Heparin Indication: pulmonary embolus  Allergies  Allergen Reactions   Ivp Dye [Iodinated Diagnostic Agents] Shortness Of Breath    "allergic to CT scan dye", took benadryl with last scan and tolerated well    Patient Measurements: Height: 5\' 4"  (162.6 cm) Weight: 93.2 kg (205 lb 7.5 oz) IBW/kg (Calculated) : 59.2 Heparin Dosing Weight: 81 kg  Vital Signs: Temp: 98 F (36.7 C) (11/12 0315) Temp Source: Axillary (11/12 0315) BP: 131/64 (11/12 0600) Pulse Rate: 94 (11/12 0600)  Labs: Recent Labs    05/02/20 0600 05/02/20 0803 05/02/20 1645 05/03/20 0100 05/03/20 0115 05/03/20 1131 05/03/20 2038 05/04/20 0530  HGB 5.5*   < > 7.8*  --  7.4*  --   --  9.4*  HCT 17.6*   < > 24.0*  --  22.6*  --   --  29.2*  PLT 45*  --   --   --  36*  --   --  39*  HEPARINUNFRC  --    < > 0.42   < >  --  0.35 0.32 0.54  CREATININE 3.10*  --   --   --   --  2.10*  --   --    < > = values in this interval not displayed.    Estimated Creatinine Clearance: 33.7 mL/min (A) (by C-G formula based on SCr of 2.1 mg/dL (H)).   Medications:  Infusions:   sodium chloride Stopped (05/03/20 0253)   ceFEPime (MAXIPIME) IV Stopped (05/04/20 0452)   heparin 1,250 Units/hr (05/04/20 0600)    Assessment: Patient is a 69 y.o M with stage IV mantle cell lymphoma on chemotherapy (got CHOP on 04/21/20), presented to the ED on 11/7 with c/o SOB, groin swelling and emesis.  Chest CT showed acute PE.  Pharmacy is consulted to start heparin drip for VTE.  Today, 05/04/2020: Heparin level 0.54, remains therapeutic on heparin 1250 units/hr CBC: Hgb low/improved to 9.4 (s/p transfusion 11/10), Plts 39k remain low RN reports no further bleeding nor interruptions in therapy.  Goal of Therapy:  Heparin level 0.3-0.7 units/ml Monitor platelets by anticoagulation protocol: Yes   Plan:  Continue heparin IV infusion at 1250 units/hr Daily  heparin level and CBC Continue to monitor s/s bleeding.  Gretta Arab PharmD, BCPS Clinical Pharmacist [facility] main pharmacy [phone] 05/04/2020 7:55 AM

## 2020-05-04 NOTE — Progress Notes (Addendum)
HEMATOLOGY-ONCOLOGY PROGRESS NOTE  SUBJECTIVE:  Remains off oxygen.  Denies abdominal pain, nausea, vomiting.  No bleeding reported.  Afebrile.  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4483 mg/mL) chemo infusion, 90 mg/m2 = 200 mg, Intravenous,  Once, 5 of 5 cycles Administration: 200 mg (06/29/2019), 200 mg (06/30/2019), 200 mg (07/27/2019), 200 mg (07/28/2019), 200 mg (08/24/2019), 200 mg (08/25/2019), 200 mg (09/21/2019), 200 mg (09/22/2019) riTUXimab-pvvr (RUXIENCE) 800 mg in sodium chloride 0.9 % 250 mL (2.4242 mg/mL) infusion, 375 mg/m2 = 800 mg, Intravenous,  Once, 5 of 5 cycles Dose modification: 375 mg/m2 (original dose 375 mg/m2, Cycle 2), 400 mg (original dose 375 mg/m2, Cycle 2, Reason: Other (see comments), Comment: finishing rituximab from previous day, infusion not completed), 300 mg (original dose 300 mg, Cycle 4) Administration: 800 mg (06/29/2019), 800 mg (07/27/2019), 800 mg (08/24/2019), 400 mg (07/28/2019), 800 mg (10/20/2019), 800 mg (09/21/2019), 300 mg (09/22/2019)  for chemotherapy treatment.    04/20/2020 -  Chemotherapy   The patient had DOXOrubicin (ADRIAMYCIN) chemo injection 100 mg, 48 mg/m2 = 104 mg, Intravenous,  Once, 1 of 8 cycles Administration: 100 mg (04/21/2020) palonosetron (ALOXI) injection 0.25 mg, 0.25 mg,  Intravenous,  Once, 1 of 8 cycles Administration: 0.25 mg (04/21/2020) pegfilgrastim-cbqv (UDENYCA) injection 6 mg, 6 mg, Subcutaneous, Once, 1 of 1 cycle Administration: 6 mg (04/24/2020) vinCRIStine (ONCOVIN) 2 mg in sodium chloride 0.9 % 50 mL chemo infusion, 2 mg, Intravenous,  Once, 1 of 8 cycles Administration: 2 mg (04/21/2020) cyclophosphamide (CYTOXAN) 1,560 mg in sodium chloride 0.9 % 250 mL chemo infusion, 750 mg/m2 = 1,560 mg, Intravenous,  Once, 1 of 8 cycles Administration: 1,560 mg (04/21/2020) fosaprepitant (EMEND) 150 mg in sodium chloride 0.9 % 145 mL IVPB, 150 mg, Intravenous,  Once, 1 of 8 cycles Administration: 150 mg (04/21/2020)  for chemotherapy treatment.       REVIEW OF SYSTEMS:   Negative except as noted in the HPI.  PHYSICAL EXAMINATION: ECOG PERFORMANCE STATUS: 2 - Symptomatic, <50% confined to bed  Vitals:   05/04/20 0800 05/04/20 0942  BP: (!) 141/64 137/65  Pulse: 94   Resp: 20   Temp: (!) 97.4 F (36.3 C)   SpO2: 97%    Filed Weights   05/20/2020 1826 04/30/20 0315 05/03/20 0700  Weight: 94.9 kg 93 kg 93.2 kg    Intake/Output from previous day: 11/11 0701 - 11/12 0700 In: 596.5 [I.V.:349.4; IV Piggyback:247.1] Out: 700 [Urine:700]  GENERAL: Awake and alert, no distress SKIN: skin color, texture, turgor are normal, no rashes or significant lesions EYES: normal, Conjunctiva are pink and non-injected, sclera clear LUNGS: Clear HEART: regular rate & rhythm and no murmurs, 1+ bilateral lower extremity edema ABDOMEN: Significant abdominal distention NEURO: Alert and oriented x3.  LABORATORY DATA:  I have reviewed the data as listed CMP Latest Ref Rng & Units 05/03/2020 05/02/2020 05/01/2020  Glucose 70 - 99 mg/dL 144(H) 255(H) 143(H)  BUN 8 - 23 mg/dL 99(H) 93(H) 67(H)  Creatinine 0.61 - 1.24 mg/dL 2.10(H) 3.10(H) 1.90(H)  Sodium 135 - 145 mmol/L 156(H) 143 147(H)  Potassium 3.5 - 5.1 mmol/L 4.1 6.1(H) 4.9  Chloride 98 - 111 mmol/L 120(H)  109 110  CO2 22 - 32 mmol/L 22 19(L) 26  Calcium 8.9 - 10.3 mg/dL 7.0(L) 6.9(L) 7.9(L)  Total Protein 6.5 - 8.1 g/dL 5.7(L) - -  Total Bilirubin 0.3 - 1.2 mg/dL 0.8 - -  Alkaline Phos 38 - 126 U/L 57 - -  AST 15 - 41 U/L 35 - -  ALT 0 - 44 U/L 20 - -   . CBC Latest Ref Rng & Units 05/04/2020 05/03/2020 05/02/2020  WBC 4.0 - 10.5 K/uL 3.9(L) 2.4(L) -  Hemoglobin 13.0 - 17.0 g/dL 9.4(L) 7.4(L) 7.8(L)  Hematocrit 39 - 52 % 29.2(L) 22.6(L) 24.0(L)  Platelets 150 - 400 K/uL 39(L) 36(L) -    Lab Results  Component Value Date   WBC 3.9 (L) 05/04/2020   HGB 9.4 (L) 05/04/2020   HCT 29.2 (L) 05/04/2020   MCV 91.8 05/04/2020   PLT 39 (L) 05/04/2020   NEUTROABS 1.1 (L) 05/03/2020    CT ABDOMEN PELVIS WO CONTRAST  Addendum Date: 04/19/2020   ADDENDUM REPORT: 04/19/2020 12:54 ADDENDUM: The peritoneal fluid within the pelvis is not enhancing as this is a non IV contrast imaging exam. Therefore would described fluid collection as having thickened rim. Favor peritoneal metastasis. Findings conveyed toSCOTT GOLDSTON on 04/19/2020  at12:54. Electronically Signed   By: Suzy Bouchard M.D.   On: 04/19/2020 12:54   Result Date: 04/19/2020 CLINICAL DATA:  Abdominal pain.  Tachycardia.  Mantle cell lymphoma EXAM: CT ABDOMEN AND PELVIS WITHOUT CONTRAST TECHNIQUE: Multidetector CT imaging of the abdomen and pelvis was performed following the standard protocol without IV contrast. COMPARISON:  PET-CT scan 01/02/2020 FINDINGS: Lower chest: New small RIGHT effusion. No pericardial effusion. There is new precordial adenopathy anterior to the RIGHT heart and liver (image 9/2). Hepatobiliary: New intraperitoneal free fluid along the margin of the RIGHT hepatic lobe. This fluid is simple attenuation. The liver appears unchanged on noncontrast exam. Large cyst the LEFT hepatic lobe. Gallbladder normal. Pancreas: Pancreas is grossly normal noncontrast exam Spleen: . spleen is unchanged Adrenals/urinary tract:  Adrenal glands kidneys and ureters normal. Bladder Stomach/Bowel: Stomach duodenum normal. There is a new mass lesion in the LEFT upper quadrant measuring 14.2 by 13.0 cm. The mass may be associated the small bowel or the small bowel mesentery. The descending colon is also involving the mass (image 43/2. There is extensive fluid within the leaves of the mesentery of the distal small bowel. Appendix normal. Ascending transverse colon normal. The descending colon tracks along the new LEFT upper quadrant mass. Vascular/Lymphatic: New large LEFT upper quadrant mass either represents lymphoma involving the small bowel or mesentery. Mass is very large described the stomach section. Additionally there is new periportal adenopathy with enlarged lymph nodes along the aorta measuring up to 2 cm (image 44/2. There is nodularity along the greater omentum abdominal on the LEFT. Reproductive: Prostate unremarkable Other: Small volume free fluid the pelvis. There is enhancing rim to the fluid (image 73/2 which could indicate peritonitis. Musculoskeletal: No aggressive osseous lesion. IMPRESSION: 1. Dominant finding is new large (15 cm) lymphoid mass within LEFT upper quadrant. Mass is new from PET-CT 01/02/2020. Differential includes massive mesenteric adenopathy versus lymphoma of the small bowel or descending  colon. No oral or IV contrast. 2. New periaortic retroperitoneal lymphadenopathy. New precordial adenopathy. 3. Extensive fluid within the mesentery and omental thickening in LEFT lower quadrant. 4. Free fluid the pelvis with thin enhancing rim suggest peritonitis. 5. No evidence of bowel perforation. No evidence of bowel obstruction. Electronically Signed: By: Suzy Bouchard M.D. On: 04/19/2020 12:39   DG Chest 1 View  Result Date: 05/19/2020 CLINICAL DATA:  Elevated blood pressure. EXAM: CHEST  1 VIEW COMPARISON:  April 29, 2020 FINDINGS: The lung volumes are low. There are small bilateral pleural effusions with  adjacent atelectasis. The heart size is mildly enlarged. The lung volumes are low. There is no pneumothorax. IMPRESSION: 1. Low lung volumes. 2. Small bilateral pleural effusions with adjacent atelectasis. Electronically Signed   By: Constance Holster M.D.   On: 04/30/2020 20:42   DG Chest 2 View  Result Date: 05/10/2020 CLINICAL DATA:  Shortness of breath lasting several days. Groin swelling and redness. EXAM: CHEST - 2 VIEW COMPARISON:  Chest CT 04/19/20 FINDINGS: Stable cardiomediastinal contours. Low lung volumes. Unchanged small bilateral pleural effusions. No airspace densities. IMPRESSION: Low lung volumes with persistent small bilateral pleural effusions. Electronically Signed   By: Kerby Moors M.D.   On: 05/07/2020 05:09   DG Abd 1 View  Result Date: 05/13/2020 CLINICAL DATA:  Tube placement EXAM: ABDOMEN - 1 VIEW COMPARISON:  April 29, 2020 FINDINGS: The enteric tube projects over the gastric antrum/pylorus. The tube is pointed distally. The visualized bowel gas pattern is unremarkable. IMPRESSION: The enteric tube projects over the gastric antrum/pylorus. Electronically Signed   By: Constance Holster M.D.   On: 05/20/2020 22:05   CT CHEST WO CONTRAST  Result Date: 04/19/2020 CLINICAL DATA:  Pneumonia. Pleural effusion. Concern for metastases. EXAM: CT CHEST WITHOUT CONTRAST TECHNIQUE: Multidetector CT imaging of the chest was performed following the standard protocol without IV contrast. COMPARISON:  CT dated Nov 14, 2019 FINDINGS: Cardiovascular: The heart size is stable. Coronary artery calcifications are noted. There is no significant pericardial effusion. Mediastinum/Nodes: -- No mediastinal lymphadenopathy. -- No hilar lymphadenopathy. --there is left axillary adenopathy. This has progressed since the prior study. There is mild right axillary adenopathy, progressed from prior study. -- No supraclavicular lymphadenopathy. -- Normal thyroid gland where visualized. -there is mild  diffuse esophageal wall thickening. Lungs/Pleura: There are trace to small bilateral pleural effusions, right greater than left. There is atelectasis at the right lung base. There is no pneumothorax. No area of consolidation concerning for pneumonia. Upper Abdomen: There is a partially visualized mass in the left upper quadrant, better visualized on the patient's prior CT. There is free fluid in the upper abdomen. Multiple enlarged pericardiophrenic lymph nodes are noted. There appears to be retroperitoneal adenopathy. Musculoskeletal: No chest wall abnormality. No bony spinal canal stenosis. IMPRESSION: 1. Trace to small bilateral pleural effusions, right greater than left. There is atelectasis at the right lung base. No area of consolidation concerning for pneumonia. 2. Mild diffuse esophageal wall thickening. Correlation with patient's symptoms is recommended. 3. Worsening axillary adenopathy. 4. Partially visualized mass in the left upper quadrant, better visualized on the patient's prior CT. 5. Small volume ascites. Aortic Atherosclerosis (ICD10-I70.0). Electronically Signed   By: Constance Holster M.D.   On: 04/19/2020 20:37   CT Angio Chest PE W and/or Wo Contrast  Result Date: 05/15/2020 CLINICAL DATA:  69 year old male with history of shortness of breath for the past few days. Abdominal distension. History of mantle cell lymphoma. EXAM: CT  ANGIOGRAPHY CHEST CT ABDOMEN AND PELVIS WITH CONTRAST TECHNIQUE: Multidetector CT imaging of the chest was performed using the standard protocol during bolus administration of intravenous contrast. Multiplanar CT image reconstructions and MIPs were obtained to evaluate the vascular anatomy. Multidetector CT imaging of the abdomen and pelvis was performed using the standard protocol during bolus administration of intravenous contrast. CONTRAST:  171mL OMNIPAQUE IOHEXOL 350 MG/ML SOLN COMPARISON:  CT the abdomen and pelvis 04/19/2020. CT the chest 04/19/2020. FINDINGS:  CTA CHEST FINDINGS Cardiovascular: Study is limited by patient respiratory motion. However, despite this limitation there are segmental sized filling defects within pulmonary arteries to the left upper and lower lobes best appreciated on axial images 73 and 106 of series 5, and axial image 148 of series 5 respectively, compatible with pulmonary embolism. Heart size is normal. There is no significant pericardial fluid, thickening or pericardial calcification. Aortic atherosclerosis. No definite coronary artery calcifications. Mediastinum/Nodes: Multiple enlarged anterior mediastinal lymph nodes noted inferiorly, immediately above the right hemidiaphragm, measuring up to 2.3 cm in short axis (axial image 13 of series 11), increased compared to the prior study. Prominent but nonenlarged distal paraesophageal lymph node measuring 8 mm in short axis, nonspecific. Circumferential thickening of the distal third of the esophagus. Mildly enlarged left axillary lymph nodes measuring up to 1.3 cm in short axis. Lungs/Pleura: Collapse of the trachea and mainstem bronchi during expiration, indicative of tracheobronchomalacia. No acute consolidative airspace disease. Small bilateral pleural effusions lying dependently. No definite suspicious appearing pulmonary nodules or masses are noted. Musculoskeletal: There are no aggressive appearing lytic or blastic lesions noted in the visualized portions of the skeleton. Review of the MIP images confirms the above findings. CT ABDOMEN and PELVIS FINDINGS Hepatobiliary: Multiple well-defined low-attenuation lesions are again noted throughout the liver, similar in size, number and distribution to the prior study, largest of which are compatible with simple cysts measuring up to 5.3 x 3.7 cm in segment 2 and for a (axial image 18 of series 11). Smaller lesions are too small to definitively characterize, but also favored to represent small cysts or biliary hamartomas. No new suspicious  hepatic lesions. No intra or extrahepatic biliary ductal dilatation. 6 mm calcified gallstone lying dependently in the gallbladder. Gallbladder is not distended. Pancreas: No pancreatic mass. No pancreatic ductal dilatation. No pancreatic or peripancreatic fluid collections or inflammatory changes. Spleen: Unremarkable. Adrenals/Urinary Tract: 1.2 cm low-attenuation lesion in the upper pole of the right kidney and exophytic 2.4 cm low-attenuation lesion in the upper pole of the left kidney, compatible with simple cysts. Other subcentimeter low-attenuation lesions in both kidneys, too small to definitively characterize, but favored to represent tiny cysts. No hydroureteronephrosis. Urinary bladder is nearly decompressed, but otherwise unremarkable in appearance. Stomach/Bowel: Stomach is unremarkable in appearance. No pathologic dilatation of small bowel or colon. Multiple bowel loops in the left upper quadrant are intimately associated with the large soft tissue mass (discussed below). Colonic diverticulosis, particularly in the sigmoid colon, without definitive evidence to suggest an acute diverticulitis at this time. Vascular/Lymphatic: Aortic atherosclerosis, without evidence of aneurysm or dissection in the abdominal or pelvic vasculature. Extensive lymphadenopathy noted in the abdomen, most evident in the retroperitoneum where there are bulky para-aortic nodal masses measuring up to 2.3 x 4.1 cm near the left renal artery (axial image 41 of series 11), 3.2 x 4.6 cm adjacent to the infrarenal abdominal aorta (axial image 48 of series 11), and 3.4 x 3.2 cm adjacent to the aortic bifurcation (axial image 54 of series  11), all of which appear similar to the recent prior study. Bulky celiac axis and gastrohepatic ligament lymph nodes are noted, measuring up to 1.9 cm in short axis (axial image 24 of series 11). Several prominent but nonenlarged pelvic lymph nodes are noted (nonspecific). Reproductive: Prostate gland  and seminal vesicles are unremarkable in appearance. Other: Bulky poorly defined soft tissue mass in the left upper quadrant of the abdomen which is intimately associated with multiple adjacent bowel loops, and difficult to distinctly defined, but estimated to measure approximately 15.5 x 13.5 x 13.4 cm (axial image 38 of series 11 and coronal image 53 of series 14). Moderate volume of ascites. Diffuse enhancement of the peritoneal membranes, indicative of widespread peritoneal metastatic disease. Extensive omental caking, best appreciated in the left side of the abdomen where the bulkiest portion of this measures approximately 14.9 x 4.0 cm (axial image 47 of series 11). No pneumoperitoneum. Musculoskeletal: There are no aggressive appearing lytic or blastic lesions noted in the visualized portions of the skeleton. Review of the MIP images confirms the above findings. IMPRESSION: 1. Study is positive for segmental sized pulmonary emboli in the left lung. These appear nonocclusive at this time, and there is no associated pulmonary hemorrhage to suggest frank pulmonary infarct. 2. Bulky soft tissue mass in the left upper quadrant with extensive lymphadenopathy in the chest and abdomen, and evidence of widespread intraperitoneal metastatic disease, as detailed above. 3. Small bilateral pleural effusions lying dependently. 4. Aortic atherosclerosis. 5. Tracheobronchomalacia. 6. Cholelithiasis. 7. Colonic diverticulosis. Electronically Signed   By: Vinnie Langton M.D.   On: 05/15/2020 12:50   CT Abdomen Pelvis W Contrast  Result Date: 05/15/2020 CLINICAL DATA:  69 year old male with history of shortness of breath for the past few days. Abdominal distension. History of mantle cell lymphoma. EXAM: CT ANGIOGRAPHY CHEST CT ABDOMEN AND PELVIS WITH CONTRAST TECHNIQUE: Multidetector CT imaging of the chest was performed using the standard protocol during bolus administration of intravenous contrast. Multiplanar CT image  reconstructions and MIPs were obtained to evaluate the vascular anatomy. Multidetector CT imaging of the abdomen and pelvis was performed using the standard protocol during bolus administration of intravenous contrast. CONTRAST:  122m OMNIPAQUE IOHEXOL 350 MG/ML SOLN COMPARISON:  CT the abdomen and pelvis 04/19/2020. CT the chest 04/19/2020. FINDINGS: CTA CHEST FINDINGS Cardiovascular: Study is limited by patient respiratory motion. However, despite this limitation there are segmental sized filling defects within pulmonary arteries to the left upper and lower lobes best appreciated on axial images 73 and 106 of series 5, and axial image 148 of series 5 respectively, compatible with pulmonary embolism. Heart size is normal. There is no significant pericardial fluid, thickening or pericardial calcification. Aortic atherosclerosis. No definite coronary artery calcifications. Mediastinum/Nodes: Multiple enlarged anterior mediastinal lymph nodes noted inferiorly, immediately above the right hemidiaphragm, measuring up to 2.3 cm in short axis (axial image 13 of series 11), increased compared to the prior study. Prominent but nonenlarged distal paraesophageal lymph node measuring 8 mm in short axis, nonspecific. Circumferential thickening of the distal third of the esophagus. Mildly enlarged left axillary lymph nodes measuring up to 1.3 cm in short axis. Lungs/Pleura: Collapse of the trachea and mainstem bronchi during expiration, indicative of tracheobronchomalacia. No acute consolidative airspace disease. Small bilateral pleural effusions lying dependently. No definite suspicious appearing pulmonary nodules or masses are noted. Musculoskeletal: There are no aggressive appearing lytic or blastic lesions noted in the visualized portions of the skeleton. Review of the MIP images confirms the above findings.  CT ABDOMEN and PELVIS FINDINGS Hepatobiliary: Multiple well-defined low-attenuation lesions are again noted  throughout the liver, similar in size, number and distribution to the prior study, largest of which are compatible with simple cysts measuring up to 5.3 x 3.7 cm in segment 2 and for a (axial image 18 of series 11). Smaller lesions are too small to definitively characterize, but also favored to represent small cysts or biliary hamartomas. No new suspicious hepatic lesions. No intra or extrahepatic biliary ductal dilatation. 6 mm calcified gallstone lying dependently in the gallbladder. Gallbladder is not distended. Pancreas: No pancreatic mass. No pancreatic ductal dilatation. No pancreatic or peripancreatic fluid collections or inflammatory changes. Spleen: Unremarkable. Adrenals/Urinary Tract: 1.2 cm low-attenuation lesion in the upper pole of the right kidney and exophytic 2.4 cm low-attenuation lesion in the upper pole of the left kidney, compatible with simple cysts. Other subcentimeter low-attenuation lesions in both kidneys, too small to definitively characterize, but favored to represent tiny cysts. No hydroureteronephrosis. Urinary bladder is nearly decompressed, but otherwise unremarkable in appearance. Stomach/Bowel: Stomach is unremarkable in appearance. No pathologic dilatation of small bowel or colon. Multiple bowel loops in the left upper quadrant are intimately associated with the large soft tissue mass (discussed below). Colonic diverticulosis, particularly in the sigmoid colon, without definitive evidence to suggest an acute diverticulitis at this time. Vascular/Lymphatic: Aortic atherosclerosis, without evidence of aneurysm or dissection in the abdominal or pelvic vasculature. Extensive lymphadenopathy noted in the abdomen, most evident in the retroperitoneum where there are bulky para-aortic nodal masses measuring up to 2.3 x 4.1 cm near the left renal artery (axial image 41 of series 11), 3.2 x 4.6 cm adjacent to the infrarenal abdominal aorta (axial image 48 of series 11), and 3.4 x 3.2 cm  adjacent to the aortic bifurcation (axial image 54 of series 11), all of which appear similar to the recent prior study. Bulky celiac axis and gastrohepatic ligament lymph nodes are noted, measuring up to 1.9 cm in short axis (axial image 24 of series 11). Several prominent but nonenlarged pelvic lymph nodes are noted (nonspecific). Reproductive: Prostate gland and seminal vesicles are unremarkable in appearance. Other: Bulky poorly defined soft tissue mass in the left upper quadrant of the abdomen which is intimately associated with multiple adjacent bowel loops, and difficult to distinctly defined, but estimated to measure approximately 15.5 x 13.5 x 13.4 cm (axial image 38 of series 11 and coronal image 53 of series 14). Moderate volume of ascites. Diffuse enhancement of the peritoneal membranes, indicative of widespread peritoneal metastatic disease. Extensive omental caking, best appreciated in the left side of the abdomen where the bulkiest portion of this measures approximately 14.9 x 4.0 cm (axial image 47 of series 11). No pneumoperitoneum. Musculoskeletal: There are no aggressive appearing lytic or blastic lesions noted in the visualized portions of the skeleton. Review of the MIP images confirms the above findings. IMPRESSION: 1. Study is positive for segmental sized pulmonary emboli in the left lung. These appear nonocclusive at this time, and there is no associated pulmonary hemorrhage to suggest frank pulmonary infarct. 2. Bulky soft tissue mass in the left upper quadrant with extensive lymphadenopathy in the chest and abdomen, and evidence of widespread intraperitoneal metastatic disease, as detailed above. 3. Small bilateral pleural effusions lying dependently. 4. Aortic atherosclerosis. 5. Tracheobronchomalacia. 6. Cholelithiasis. 7. Colonic diverticulosis. Electronically Signed   By: Vinnie Langton M.D.   On: 05/06/2020 12:50   DG CHEST PORT 1 VIEW  Result Date: 04/30/2020 CLINICAL DATA:  Central line placement EXAM: PORTABLE CHEST 1 VIEW COMPARISON:  Radiograph 05/17/2020 FINDINGS: Endotracheal tube tip terminates 2.2 cm from the expected location of the carina. Transesophageal tube tip and side port distal to the GE junction. Right IJ catheter tip terminates in the right atrium. Telemetry leads overlie the chest. Layering bilateral effusions adjacent passive atelectasis. Additional bandlike subsegmental atelectasis in the lungs as well. Stable borderline cardiomegaly accounting for portable technique. No acute osseous or soft tissue abnormality. Degenerative changes are present in the imaged spine and shoulders. IMPRESSION: 1. Endotracheal tube tip terminates 2.2 cm from the expected location of the carina. Could consider retraction 2 cm to the mid trachea. 2. Transesophageal tube tip and side port distal to the GE junction. 3. Right IJ catheter tip terminates in the right atrium. 4. Layering bilateral effusions with adjacent passive atelectasis and vascular congestion. 5. Stable borderline cardiomegaly. Electronically Signed   By: Lovena Le M.D.   On: 04/30/2020 01:14   DG CHEST PORT 1 VIEW  Result Date: 04/28/2020 CLINICAL DATA:  Endotracheal tube placement EXAM: PORTABLE CHEST 1 VIEW COMPARISON:  April 29, 2020 FINDINGS: The endotracheal tube terminates above the carina. The lung volumes are low. Again identified are bilateral pleural effusions with adjacent atelectasis. The heart size is stable. There is no pneumothorax. The enteric tube terminates below the left hemidiaphragm. IMPRESSION: Lines and tubes as above.  Otherwise, stable exam. Electronically Signed   By: Constance Holster M.D.   On: 05/05/2020 22:04   DG Chest Portable 1 View  Result Date: 04/19/2020 CLINICAL DATA:  Cough, wheezing EXAM: PORTABLE CHEST 1 VIEW COMPARISON:  01/09/2020 FINDINGS: Low lung volumes. Minimal atelectasis or scarring at the left lung base. No pleural effusion or pneumothorax. Heart size is  within normal limits for technique. IMPRESSION: Minimal atelectasis or scarring at the left lung base. Electronically Signed   By: Macy Mis M.D.   On: 04/19/2020 12:18   DG Abd Portable 2 Views  Result Date: 05/11/2020 CLINICAL DATA:  Abdominal distension and pain. EXAM: PORTABLE ABDOMEN - 2 VIEW COMPARISON:  04/19/2020 FINDINGS: There is mild gaseous distension of the stomach. No dilated loops of large or small bowel. There is no evidence of free air. No radio-opaque calculi or other significant radiographic abnormality is seen. IMPRESSION: Nonobstructive bowel gas pattern. Electronically Signed   By: Kerby Moors M.D.   On: 05/12/2020 06:12   DG Swallowing Func-Speech Pathology  Result Date: 05/04/2020 Objective Swallowing Evaluation: Type of Study: MBS-Modified Barium Swallow Study  Patient Details Name: Dean Dixon MRN: [id number] Date of Birth: July 17, 1950 Today's Date: 05/04/2020 Time: SLP Start Time (ACUTE ONLY): 0854 -SLP Stop Time (ACUTE ONLY): 0910 SLP Time Calculation (min) (ACUTE ONLY): 16 min Past Medical History: Past Medical History: Diagnosis Date . Cancer ([place])   Mantle Cell Lymphoma . Diabetes mellitus without complication ([place])  . Hypertension  Past Surgical History: Past Surgical History: Procedure Laterality Date . APPENDECTOMY   . TONSILLECTOMY   HPI: Redford Behrle is a 69 y.o. male with medical history significant of mantle cell lymphoma on CHOP. Presenting with dyspnea and ab distention. Reports 2 weeks of shortness of breath that has worsened in the last 2 days. ETT 11/7-11/9.  Subjective: alert at bedside Assessment / Plan / Recommendation CHL IP CLINICAL IMPRESSIONS 05/04/2020 Clinical Impression Pt demonstrated oropharyngeal dysphagia with aspiration of nectar thick barium marked by delayed initiation and incompetent laryngeal closure. Barium aggregated in his valleculae and pyriform sinuses for approximately 4 seconds filling pyriforms and  spilling to vocal cords prior to  laryngeal closure. Subsequent trial nectar was aspirated with cough not clearing trachea. He coughed intermittently throughout without new penetrates/aspirates suspect from previous intrusion. Mild delay with honey thick with mild pharyngeal residue. Inconsistent suboptimal movement of hyoid anteriorally and superiorally. Pt clearly fatigued at onset of and during study with increased respirations and decreased endurance. Compensatory strategies not attempted given level of current ability to perform from medical standpoint. Mild lingual residue cleared with independent swallows. Recommend he initiate Dys 1 (puree), honey thick liquid cautiously with pills whole in puree, support for self feeding and rest breaks. Recommendations may be modified if there are changes to plan of care in terms of Palliative.          SLP Visit Diagnosis Dysphagia, oropharyngeal phase (R13.12) Attention and concentration deficit following -- Frontal lobe and executive function deficit following -- Impact on safety and function Moderate aspiration risk   CHL IP TREATMENT RECOMMENDATION 05/04/2020 Treatment Recommendations Therapy as outlined in treatment plan below   Prognosis 05/04/2020 Prognosis for Safe Diet Advancement Good Barriers to Reach Goals (No Data) Barriers/Prognosis Comment -- CHL IP DIET RECOMMENDATION 05/04/2020 SLP Diet Recommendations Honey thick liquids;Dysphagia 1 (Puree) solids Liquid Administration via Cup;No straw Medication Administration Whole meds with puree Compensations Slow rate;Small sips/bites;Other (Comment) Postural Changes Seated upright at 90 degrees   CHL IP OTHER RECOMMENDATIONS 05/04/2020 Recommended Consults -- Oral Care Recommendations Oral care BID Other Recommendations Order thickener from pharmacy   CHL IP FOLLOW UP RECOMMENDATIONS 05/04/2020 Follow up Recommendations Other (comment)   CHL IP FREQUENCY AND DURATION 05/04/2020 Speech Therapy Frequency (ACUTE ONLY) min 2x/week Treatment Duration 2  weeks      CHL IP ORAL PHASE 05/04/2020 Oral Phase Impaired Oral - Pudding Teaspoon -- Oral - Pudding Cup -- Oral - Honey Teaspoon -- Oral - Honey Cup Lingual/palatal residue Oral - Nectar Teaspoon -- Oral - Nectar Cup Lingual/palatal residue Oral - Nectar Straw -- Oral - Thin Teaspoon -- Oral - Thin Cup -- Oral - Thin Straw -- Oral - Puree -- Oral - Mech Soft -- Oral - Regular Lingual/palatal residue Oral - Multi-Consistency -- Oral - Pill -- Oral Phase - Comment --  CHL IP PHARYNGEAL PHASE 05/04/2020 Pharyngeal Phase Impaired Pharyngeal- Pudding Teaspoon -- Pharyngeal -- Pharyngeal- Pudding Cup -- Pharyngeal -- Pharyngeal- Honey Teaspoon -- Pharyngeal -- Pharyngeal- Honey Cup Delayed swallow initiation-vallecula;Pharyngeal residue - pyriform;Pharyngeal residue - valleculae Pharyngeal -- Pharyngeal- Nectar Teaspoon -- Pharyngeal -- Pharyngeal- Nectar Cup Delayed swallow initiation-pyriform sinuses;Penetration/Aspiration during swallow;Pharyngeal residue - pyriform Pharyngeal Material enters airway, remains ABOVE vocal cords and not ejected out;Material enters airway, passes BELOW cords and not ejected out despite cough attempt by patient Pharyngeal- Nectar Straw -- Pharyngeal -- Pharyngeal- Thin Teaspoon -- Pharyngeal -- Pharyngeal- Thin Cup -- Pharyngeal -- Pharyngeal- Thin Straw -- Pharyngeal -- Pharyngeal- Puree -- Pharyngeal -- Pharyngeal- Mechanical Soft -- Pharyngeal -- Pharyngeal- Regular Pharyngeal residue - posterior pharnyx Pharyngeal -- Pharyngeal- Multi-consistency -- Pharyngeal -- Pharyngeal- Pill -- Pharyngeal -- Pharyngeal Comment --  CHL IP CERVICAL ESOPHAGEAL PHASE 05/04/2020 Cervical Esophageal Phase WFL Pudding Teaspoon -- Pudding Cup -- Honey Teaspoon -- Honey Cup -- Nectar Teaspoon -- Nectar Cup -- Nectar Straw -- Thin Teaspoon -- Thin Cup -- Thin Straw -- Puree -- Mechanical Soft -- Regular -- Multi-consistency -- Pill -- Cervical Esophageal Comment -- [place] Siren 05/04/2020, 10:56 AM  Orbie Pyo Colvin Caroli.Ed Risk analyst [phone] Office [phone]  ECHOCARDIOGRAM COMPLETE  Result Date: 04/20/2020    ECHOCARDIOGRAM REPORT   Patient Name:   Dean Dixon Date of Exam: 04/20/2020 Medical Rec #:  [id number]    Height:       64.0 in Accession #:    [id number]   Weight:       210.1 lb Date of Birth:  Jul 15, 1950   BSA:          1.998 m Patient Age:    38 years     BP:           156/83 mmHg Patient Gender: M            HR:           88 bpm. Exam Location:  Inpatient Procedure: 2D Echo, Cardiac Doppler and Color Doppler Indications:    Chemo evaluation  History:        Patient has prior history of Echocardiogram examinations, most                 recent 11/14/2019. COPD; Risk Factors:Hypertension and Diabetes.                 Mantle cell lymphoma.  Sonographer:    Dustin Flock Referring Phys: [place]  1. Left ventricular ejection fraction, by estimation, is 65 to 70%. The left ventricle has normal function. The left ventricle has no regional wall motion abnormalities. Left ventricular diastolic parameters are consistent with Grade I diastolic dysfunction (impaired relaxation).  2. Right ventricular systolic function is normal. The right ventricular size is normal. Tricuspid regurgitation signal is inadequate for assessing PA pressure.  3. The mitral valve is grossly normal. Trivial mitral valve regurgitation. No evidence of mitral stenosis.  4. The aortic valve is tricuspid. There is mild calcification of the aortic valve. There is mild thickening of the aortic valve. Aortic valve regurgitation is not visualized. Mild aortic valve sclerosis is present, with no evidence of aortic valve stenosis.  5. The inferior vena cava is normal in size with greater than 50% respiratory variability, suggesting right atrial pressure of 3 mmHg. Comparison(s): Changes from prior study are noted. EF is now 65-70%. FINDINGS  Left Ventricle: Left  ventricular ejection fraction, by estimation, is 65 to 70%. The left ventricle has normal function. The left ventricle has no regional wall motion abnormalities. The left ventricular internal cavity size was normal in size. There is  no left ventricular hypertrophy. Left ventricular diastolic parameters are consistent with Grade I diastolic dysfunction (impaired relaxation). Normal left ventricular filling pressure. Right Ventricle: The right ventricular size is normal. No increase in right ventricular wall thickness. Right ventricular systolic function is normal. Tricuspid regurgitation signal is inadequate for assessing PA pressure. Left Atrium: Left atrial size was normal in size. Right Atrium: Right atrial size was normal in size. Pericardium: Trivial pericardial effusion is present. Presence of pericardial fat pad. Mitral Valve: The mitral valve is grossly normal. Trivial mitral valve regurgitation. No evidence of mitral valve stenosis. Tricuspid Valve: The tricuspid valve is grossly normal. Tricuspid valve regurgitation is not demonstrated. No evidence of tricuspid stenosis. Aortic Valve: The aortic valve is tricuspid. There is mild calcification of the aortic valve. There is mild thickening of the aortic valve. Aortic valve regurgitation is not visualized. Mild aortic valve sclerosis is present, with no evidence of aortic valve stenosis. Aortic valve mean gradient measures 11.1 mmHg. Aortic valve peak gradient measures 16.7 mmHg. Aortic valve area, by VTI measures 2.42 cm. Pulmonic  Valve: The pulmonic valve was grossly normal. Pulmonic valve regurgitation is not visualized. No evidence of pulmonic stenosis. Aorta: The aortic root is normal in size and structure. Venous: The right upper pulmonary vein is normal. The inferior vena cava is normal in size with greater than 50% respiratory variability, suggesting right atrial pressure of 3 mmHg. IAS/Shunts: The atrial septum is grossly normal. Additional  Comments: There is a small pleural effusion in the left lateral region.  LEFT VENTRICLE PLAX 2D LVIDd:         4.30 cm  Diastology LVIDs:         2.60 cm  LV e' medial:    8.81 cm/s LV PW:         1.10 cm  LV E/e' medial:  7.2 LV IVS:        1.10 cm  LV e' lateral:   7.51 cm/s LVOT diam:     2.30 cm  LV E/e' lateral: 8.4 LV SV:         92 LV SV Index:   46 LVOT Area:     4.15 cm  RIGHT VENTRICLE RV Basal diam:  3.20 cm RV S prime:     11.00 cm/s TAPSE (M-mode): 2.9 cm LEFT ATRIUM             Index       RIGHT ATRIUM           Index LA diam:        3.30 cm 1.65 cm/m  RA Area:     15.30 cm LA Vol (A2C):   43.2 ml 21.62 ml/m RA Volume:   37.60 ml  18.82 ml/m LA Vol (A4C):   30.7 ml 15.37 ml/m LA Biplane Vol: 36.9 ml 18.47 ml/m  AORTIC VALVE AV Area (Vmax):    2.42 cm AV Area (Vmean):   2.14 cm AV Area (VTI):     2.42 cm AV Vmax:           204.20 cm/s AV Vmean:          159.222 cm/s AV VTI:            0.381 m AV Peak Grad:      16.7 mmHg AV Mean Grad:      11.1 mmHg LVOT Vmax:         119.00 cm/s LVOT Vmean:        82.200 cm/s LVOT VTI:          0.222 m LVOT/AV VTI ratio: 0.58  AORTA Ao Root diam: 3.10 cm MITRAL VALVE MV Area (PHT): 4.06 cm     SHUNTS MV Decel Time: 187 msec     Systemic VTI:  0.22 m MV E velocity: 63.40 cm/s   Systemic Diam: 2.30 cm MV A velocity: 104.00 cm/s MV E/A ratio:  0.61 Eleonore Chiquito MD Electronically signed by Eleonore Chiquito MD Signature Date/Time: 04/20/2020/10:32:25 AM    Final    [place] ASCITES (ABDOMEN LIMITED)  Result Date: 04/19/2020 CLINICAL DATA:  69 year old male with abdominal distension, ascites EXAM: LIMITED ABDOMEN ULTRASOUND FOR ASCITES TECHNIQUE: Limited ultrasound survey for ascites was performed in all four abdominal quadrants. COMPARISON:  CT abdomen/pelvis 04/19/2020 FINDINGS: Sonographic interrogation of the abdomen was performed in anticipation of paracentesis. Unfortunately, the volume is a ascites is quite small and insufficient for drainage. There is however  extensive omental caking and peritoneal nodularity. IMPRESSION: 1. Small volume ascites, insufficient for paracentesis. 2. Extensive omental caking and peritoneal nodularity consistent with peritoneal  carcinomatosis. Electronically Signed   By: Jacqulynn Cadet M.D.   On: 04/19/2020 16:53   VAS [place] LOWER EXTREMITY VENOUS (DVT)  Result Date: 05/01/2020  Lower Venous DVT Study Indications: Pain, Swelling, and Edema.  Risk Factors: Immobility confirmed PE. Performing Technologist: Griffin Basil RCT RDMS  Examination Guidelines: A complete evaluation includes B-mode imaging, spectral Doppler, color Doppler, and power Doppler as needed of all accessible portions of each vessel. Bilateral testing is considered an integral part of a complete examination. Limited examinations for reoccurring indications may be performed as noted. The reflux portion of the exam is performed with the patient in reverse Trendelenburg.  +---------+---------------+---------+-----------+----------+--------------+ RIGHT    CompressibilityPhasicitySpontaneityPropertiesThrombus Aging +---------+---------------+---------+-----------+----------+--------------+ CFV      Full           Yes      Yes                                 +---------+---------------+---------+-----------+----------+--------------+ SFJ      Full                                                        +---------+---------------+---------+-----------+----------+--------------+ FV Prox  Full                                                        +---------+---------------+---------+-----------+----------+--------------+ FV Mid   Full                                                        +---------+---------------+---------+-----------+----------+--------------+ FV DistalFull                                                        +---------+---------------+---------+-----------+----------+--------------+ PFV      Full                                                         +---------+---------------+---------+-----------+----------+--------------+ POP      Full           Yes      Yes                                 +---------+---------------+---------+-----------+----------+--------------+ PTV      Full                                                        +---------+---------------+---------+-----------+----------+--------------+  PERO     Full                                                        +---------+---------------+---------+-----------+----------+--------------+   +---------+---------------+---------+-----------+----------+--------------+ LEFT     CompressibilityPhasicitySpontaneityPropertiesThrombus Aging +---------+---------------+---------+-----------+----------+--------------+ CFV      Full           Yes      Yes                                 +---------+---------------+---------+-----------+----------+--------------+ SFJ      Full                                                        +---------+---------------+---------+-----------+----------+--------------+ FV Prox  Full                                                        +---------+---------------+---------+-----------+----------+--------------+ FV Mid   Full                                                        +---------+---------------+---------+-----------+----------+--------------+ FV DistalFull                                                        +---------+---------------+---------+-----------+----------+--------------+ PFV      Full                                                        +---------+---------------+---------+-----------+----------+--------------+ POP      Full           Yes      Yes                                 +---------+---------------+---------+-----------+----------+--------------+ PTV      Full                                                         +---------+---------------+---------+-----------+----------+--------------+ PERO     Full                                                        +---------+---------------+---------+-----------+----------+--------------+  Summary: RIGHT: - There is no evidence of deep vein thrombosis in the lower extremity.  - No cystic structure found in the popliteal fossa.  LEFT: - There is no evidence of deep vein thrombosis in the lower extremity.  - No cystic structure found in the popliteal fossa.  *See table(s) above for measurements and observations. Electronically signed by Curt Jews MD on 05/01/2020 at 2:50:45 PM.    Final    ECHOCARDIOGRAM LIMITED  Result Date: 04/30/2020    ECHOCARDIOGRAM LIMITED REPORT   Patient Name:   Dean Dixon Date of Exam: 04/30/2020 Medical Rec #:  [id number]    Height:       64.0 in Accession #:    [id number]   Weight:       205.0 lb Date of Birth:  [birth year]   BSA:          1.977 m Patient Age:    55 years     BP:           114/60 mmHg Patient Gender: M            HR:           109 bpm. Exam Location:  Inpatient Procedure: Limited Echo, Limited Color Doppler and Cardiac Doppler Indications:    Pulmonary Embolus I26.99  History:        Patient has prior history of Echocardiogram examinations, most                 recent 04/20/2020. Mantle cell lymphoma; Risk                 Factors:Hypertension and Diabetes.  Sonographer:    Mikki Santee RDCS (AE) Referring Phys: [id number] Jonnie Finner  Sonographer Comments: Echo performed with patient supine and on artificial respirator. IMPRESSIONS  1. Cystic structure noted in liver on Subcostal images.  2. Left ventricular ejection fraction, by estimation, is 60 to 65%. The left ventricle has normal function. The left ventricle has no regional wall motion abnormalities.  3. Right ventricular systolic function is normal. The right ventricular size is normal.  4. The pericardial effusion is posterior to the left ventricle.  5. The mitral  valve is normal in structure. No evidence of mitral valve regurgitation. No evidence of mitral stenosis.  6. The aortic valve is normal in structure. Aortic valve regurgitation is not visualized. No aortic stenosis is present.  7. The inferior vena cava is normal in size with greater than 50% respiratory variability, suggesting right atrial pressure of 3 mmHg. FINDINGS  Left Ventricle: Left ventricular ejection fraction, by estimation, is 60 to 65%. The left ventricle has normal function. The left ventricle has no regional wall motion abnormalities. The left ventricular internal cavity size was normal in size. There is  no left ventricular hypertrophy. Right Ventricle: The right ventricular size is normal. No increase in right ventricular wall thickness. Right ventricular systolic function is normal. Left Atrium: Left atrial size was normal in size. Right Atrium: Right atrial size was normal in size. Pericardium: Trivial pericardial effusion is present. The pericardial effusion is posterior to the left ventricle. Mitral Valve: The mitral valve is normal in structure. No evidence of mitral valve stenosis. Tricuspid Valve: The tricuspid valve is normal in structure. Tricuspid valve regurgitation is mild . No evidence of tricuspid stenosis. Aortic Valve: The aortic valve is normal in structure. Aortic valve regurgitation is not visualized. No aortic stenosis is present. Pulmonic Valve: The pulmonic valve was normal in structure.  Pulmonic valve regurgitation is trivial. No evidence of pulmonic stenosis. Aorta: The aortic root is normal in size and structure. Venous: The inferior vena cava is normal in size with greater than 50% respiratory variability, suggesting right atrial pressure of 3 mmHg. IAS/Shunts: No atrial level shunt detected by color flow Doppler. Additional Comments: Cystic structure noted in liver on Subcostal images. LEFT VENTRICLE PLAX 2D LVIDd:         4.20 cm LVIDs:         2.60 cm LV PW:         1.00  cm LV IVS:        1.10 cm LVOT diam:     2.30 cm LVOT Area:     4.15 cm  RIGHT VENTRICLE RV S prime:     30.80 cm/s TAPSE (M-mode): 1.8 cm LEFT ATRIUM         Index LA diam:    3.00 cm 1.52 cm/m   AORTA Ao Root diam: 3.40 cm  SHUNTS Systemic Diam: 2.30 cm Jenkins Rouge MD Electronically signed by Jenkins Rouge MD Signature Date/Time: 04/30/2020/9:18:44 AM    Final    [place] EKG SITE RITE  Result Date: 05/03/2020 If Site Rite image not attached, placement could not be confirmed due to current cardiac rhythm.  [place] EKG SITE RITE  Result Date: 04/19/2020 If Site Rite image not attached, placement could not be confirmed due to current cardiac rhythm.   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 CD5+ clonal lymphoproliferative disorder" -04/26/2019 FISH revealed "Positive for p53 (17p13) deletion." -05/31/2019 PET/CT ([id number]) revealed "1. There are multiple prominent lymph nodes in the neck, both axilla, the retroperitoneum and pelvis with low level hypermetabolic activity consistent with chronic lymphocytic leukemia. Deauville 3 and 4. 2. Mild splenomegaly and mild generalized splenic hypermetabolic activity 3. No evidence of bone marrow involvement." -06/10/2019 Flow Pathology Report ([id number]) revealed "Abnormal B-cell population." -06/10/2019 Surgical Pathology Report ([id number])  revealed "Atypical lymphoid proliferation consistent with mantle cell lymphoma." -09/19/19 PET Skull Base to Thigh ([id number])-- favorable response to current treatment -01/02/2020 PET/CT ([id number]) revealed "1. Continued further decrease in size and FDG accumulation in index cervical, axillary, retroperitoneal, and pelvic lymph nodes. No lymphadenopathy by size criteria today. FDG accumulation in these lymph nodes is compatible with Deauville 2 category.  2. Interval decrease in size with resolution of hypermetabolism associated with the spleen previously."  -04/19/2020 CT abdomen pelvis revealed "1. Dominant finding is new large (15 cm) lymphoid mass within LEFT upper quadrant. Mass is new from PET-CT 01/02/2020. Differential includes massive mesenteric adenopathy versus lymphoma of the small bowel or descending colon. No oral or IV contrast. 2. New periaortic retroperitoneal lymphadenopathy. New precordial Adenopathy. 3. Extensive fluid within the mesentery and omental thickening in LEFT lower quadrant. 4. Free fluid the pelvis with thin enhancing rim suggest peritonitis. 5. No evidence of bowel perforation. No evidence of bowel Obstruction." -05/06/2020 CTA chest and CT abdomen pelvis revealed "1. Study is positive for segmental sized pulmonary emboli in the left lung. These appear nonocclusive at this time, and there is no associated pulmonary hemorrhage to suggest frank pulmonary infarct. 2. Bulky soft tissue mass in the left upper quadrant with extensive lymphadenopathy in the chest and abdomen, and evidence of widespread intraperitoneal metastatic disease, as detailed above. 3. Small bilateral pleural effusions lying dependently. 4. Aortic atherosclerosis. 5. Tracheobronchomalacia. 6.  Cholelithiasis. 7. Colonic diverticulosis."  2) Rigors from Rituxan - needing demerol as premedication. Rituxan rate to be capped at 163m/hour  3) Thrombocytopenia from chemotherapy  4) Neutropenia due to recent chemotherapy  5) anemia  6) significant new abdominal distention due to lymphoma recurrence  7) new hypercalcemia- ?  Related to dehydration and  Lymphoma -resolved at this time  8) acute hypoxic respiratory failure-multifactorial due to abdominal distention, PE, and hypertensive emergency  9) left segmental PE  10) AKI  11) tumor lysis syndrome  12) pancytopenia related to chemotherapy and possible sepsis.  PLAN: -The patient has mantle  cell lymphoma with 17 P mutation that is resistant to chemotherapy.  He received 1 cycle of CHOP with no improvement in his left upper quadrant soft tissue mass and abdominal distention at this time. -Undergoing radiation to his left upper quadrant soft tissue mass. -I have spoken with our oral chemotherapy pharmacist and we have acalabrutinib available and ready to administer once the patient is stabilized and blood counts are rebounding.  Counts are starting to stabilize.  We will reevaluate him early next week for consideration of acalabrutinib. -The patient has received G-CSF already and recommend close monitoring of his CBC with differential.  - Transfuse for hemoglobin less than 8 and platelets less than 30,000 or active bleeding.  -Monitor for bleeding on anticoagulation. -Antibiotics per primary team. -Received rasburicase on 05/01/2020 and 05/03/2020 for hyperuricemia.  Repeat uric acid has normalized today.  Continue allopurinol. -Monitor renal function closely and consider nephrology consult if worsening.     LOS: 5 days   KMikey Bussing   ADDENDUM  .Patient was Personally and independently interviewed, examined and relevant elements of the history of present illness were reviewed in details and an assessment and plan was created. All elements of the patient's history of present illness , assessment and plan were discussed in details with KMikey Bussing. The above documentation reflects our combined findings assessment and plan.  GSullivan LoneMD MS

## 2020-05-04 NOTE — Progress Notes (Signed)
Pt. has not been using BiPAP/PRN X2 evenings, pulled from room on 05/02/2020 in the p.m., currently resting comfortably in no distress.

## 2020-05-04 NOTE — Consult Note (Signed)
[facility] Nurse Consult Note: Reason for Consult: Unstageable coccyx wound.  Has progressed from stage 2 to now covered with slough Wound type: unstageable pressure injury Pressure Injury POA: Yes Measurement: 2 cm x 1.5 cm slough to wound bed.  Wound bed: 100% slough Drainage (amount, consistency, odor) minimal serosanguinous  No odor.  Periwound:intact Dressing procedure/placement/frequency: cleanse coccyx with NS and pat dry.  Apply Santyl to wound bed. Cover with NS moist gauze. Secure with silicone foam.  Re apply Santyl daily and change foam every three days.  Turn and reposition every two hours.   Will not follow at this time.  Please re-consult if needed.  Domenic Moras MSN, RN, FNP-BC CWON Wound, Ostomy, Continence Nurse Pager [phone]

## 2020-05-04 NOTE — Progress Notes (Signed)
MBS results    05/04/20 0851  SLP Visit Information  SLP Received On 05/04/20  Pain Assessment  Pain Assessment Faces  Faces Pain Scale 2  Pain Intervention(s) Monitored during session  General Information  Date of Onset 04/25/2020  HPI Dean Dixon is a 69 y.o. male with medical history significant of mantle cell lymphoma on CHOP. Presenting with dyspnea and ab distention. Reports 2 weeks of shortness of breath that has worsened in the last 2 days. ETT 11/7-11/9.   Type of Study MBS-Modified Barium Swallow Study  Previous Swallow Assessment none on file  Diet Prior to this Study NPO  Temperature Spikes Noted No  Respiratory Status Room air  History of Recent Intubation Yes  Length of Intubations (days)  (2.5- 3 days)  Date extubated 05/01/20  Behavior/Cognition Alert;Cooperative  Oral Care Completed by SLP No  Oral Cavity - Dentition Adequate natural dentition  Vision Functional for self feeding  Self-Feeding Abilities Needs assist;Able to feed self  Patient Positioning Upright in chair  Baseline Vocal Quality Normal  Anatomy WFL  Pharyngeal Secretions Not observed secondary MBS  Oral Assessment (Complete on admission/transfer/change in patient condition)  Does patient have any of the following "high(er) risk" factors? Nutritional status - fluids only or NPO for >24 hours  Does patient have any of the following "at risk" factors? Nutritional status - inadequate  Patient is HIGH RISK: Non-ventilated Order set for Adult Oral Care Protocol initiated - "High Risk Patients - Non-Ventilated" option selected  (see row information)  Oral Motor/Sensory Function  Overall Oral Motor/Sensory Function Generalized oral weakness  Oral Preparation/Oral Phase  Oral Phase Impaired  Oral - Honey  Oral - Honey Cup Lingual/palatal residue  Oral - Nectar  Oral - Nectar Cup Lingual/palatal residue  Oral - Solids  Oral - Regular Lingual/palatal residue  Pharyngeal Phase  Pharyngeal Phase  Impaired  Pharyngeal - Honey  Pharyngeal- Honey Cup Delayed swallow initiation-vallecula;Pharyngeal residue - pyriform;Pharyngeal residue - valleculae (5 sec)  Pharyngeal - Nectar  Pharyngeal- Nectar Cup Delayed swallow initiation-pyriform sinuses;Penetration/Aspiration during swallow;Pharyngeal residue - pyriform (4 sec)  Pharyngeal Material enters airway, remains ABOVE vocal cords and not ejected out;Material enters airway, passes BELOW cords and not ejected out despite cough attempt by patient  Pharyngeal - Solids  Pharyngeal- Regular Pharyngeal residue - posterior pharnyx (cleared second swallow)  Cervical Esophageal Phase  Cervical Esophageal Phase [facility]  Clinical Impression  Clinical Impression Pt demonstrated oropharyngeal dysphagia with aspiration of nectar thick barium marked by delayed initiation and incompetent laryngeal closure. Barium aggregated in his valleculae and pyriform sinuses for approximately 4 seconds filling pyriforms and spilling to vocal cords prior to laryngeal closure. Subsequent trial nectar was aspirated with cough not clearing trachea. He coughed intermittently throughout without new penetrates/aspirates suspect from previous intrusion. Mild delay with honey thick with mild pharyngeal residue. Inconsistent suboptimal movement of hyoid anteriorally and superiorally. Pt clearly fatigued at onset of and during study with increased respirations and decreased endurance. Compensatory strategies not attempted given level of current ability to perform from medical standpoint. Mild lingual residue cleared with independent swallows. Recommend he initiate Dys 1 (puree), honey thick liquid cautiously with pills whole in puree, support for self feeding and rest breaks. Recommendations may be modified if there are changes to plan of care in terms of Palliative.           SLP Visit Diagnosis Dysphagia, oropharyngeal phase (R13.12)  Impact on safety and function Moderate aspiration risk   Swallow Evaluation Recommendations  SLP Diet Recommendations Honey thick liquids;Dysphagia 1 (Puree) solids  Liquid Administration via Cup;No straw  Medication Administration Whole meds with puree  Supervision Patient able to self feed;Staff to assist with self feeding  Compensations Slow rate;Small sips/bites;Other (Comment) (rest breaks)  Postural Changes Seated upright at 90 degrees  Treatment Plan  Oral Care Recommendations Oral care BID  Other Recommendations Order thickener from pharmacy  Treatment Recommendations Therapy as outlined in treatment plan below  Follow up Recommendations Other (comment) (TBD)  Speech Therapy Frequency (ACUTE ONLY) min 2x/week  Treatment Duration 2 weeks  Interventions Patient/family education;Compensatory techniques;Trials of upgraded texture/liquids;Diet toleration management by SLP;Aspiration precaution training  Prognosis  Prognosis for Safe Diet Advancement Good  Barriers to Reach Goals  (medical diagnosis)  Individuals Consulted  Consulted and Agree with Results and Recommendations Patient  SLP Time Calculation  SLP Start Time (ACUTE ONLY) 0854  SLP Stop Time (ACUTE ONLY) 0910  SLP Time Calculation (min) (ACUTE ONLY) 16 min  SLP Evaluations  $ SLP Speech Visit 1 Visit  SLP Evaluations  $MBS Swallow 1 Procedure  Orbie Pyo [place] M.Ed Risk analyst [phone] Office [phone]

## 2020-05-04 NOTE — Progress Notes (Addendum)
NAME:  Dean Dixon, MRN:  [id number], DOB:  24-Aug-1950, LOS: 5 ADMISSION DATE:  05/20/2020, CONSULTATION DATE:  11/7/201 REFERRING MD:  TRH, CHIEF COMPLAINT:  Respiratory distress  Brief History   69 year old male with stage IV mantle cell lymphoma on CHOP presenting with N/V and progressive SOB over the last 2 weeks found to have acute left segmental PE.  Admitted to [facility] however developed hypertension 250/100 with respiratory distress with chest pain, unable to tolerate BiPAP and was intubated.      History of present illness   HPI obtained from medical chart review as patient is currently intubated and sedated on mechanical ventilation.  69 year old male with prior medical history of stage IV mantle cell lymphoma (diagnosed 06/20/2019) on CHOP, hypertension, diabetes type 2 who presented to [facility] on 11/7 with progressive shortness of breath over the last 2 weeks, worse over the last 2 days with above baseline abdominal distention, non bloody nausea/ vomiting, nonradiating intermittent midsternal chest tightness, and cough with lightly productive greenish nonbloody sputum.  Additionally reported ongoing swelling in his legs and testicles causing difficulty with urination.  Recent hospitalization 10/28 - 11/1 found to have new left upper quadrant abdominal mass and hypercalcemia.    In ER, he was afebrile, hypertensive, mildly tachypneic, and tachycardic with oxygen saturations of 98% on room air.  Noted to have multiple episodes of vomiting and retching treated with zofran.  KUB with non-obstructive gas pattern.   Labs noted for glucose 290 with HgbA1c 6.3, BUN 40, AG 17, lactic acid 3.2-> 2.3, troponin hs 52, BNP 133, D-dimer 2.47, Hgb 11.5/ HCT 35.5, platelets 100, neg COVID/ flu, UA noted turbid urine with 5 ketones, negative for leukocytes/ nitrates.  CXR showed small bilateral pleural effusion with atelectasis and low lung volumes.  He was pretreated given his contrast allergy. CTA PE and  CT abd/ pelvis showed segmental nonocclusive left pulmonary embolism without associated hemorrhage or pulmonary infarct.  Additionally noted bulky soft tissue mass in LUQ with extensive lymphadenopathy in the chest and abd with evidence of widespread intraperitoneal metastatic disease, small bilateral pleural effusion, and  tracheobronchomalacia. Started on heparin gtt per pharmacy.   He was admitted by [facility] to progressive care.  In unit, he was noted to become hypertensive with BP 250/100 with complaints of chest discomfort with noted tachypnea and lethargy.  He was treated with hydralazine and placed on nitro gtt for blood pressure management.  Repeated EKG was non acute.  ABG on NRB noted 7.291/ 56.7/ 275.  Bipap was attempted however patient kept pulling it off and therefore given concern for impending respiratory failure, he was intubated by anesthesia.  PCCM consulted for further management.   Past Medical History  Mantle cell lymphoma on CHOP, DM, HTN  Significant Hospital Events   11/07 Admitted to [facility]/ decompensated/ intubated 11/08 Weaned on PSV, lasix with 2.2L UOP  11/09 Extubated  11/11 Fever / leukocytosis improved, on abx  11/12 Mental status improved, on RA, palliative radiation   Consults:  PCCM 11/7  Procedures:  ETT 11/7 >> 11/9 TLC R IJ 11/8 >> 11/12  Significant Diagnostic Tests:  11/7 KUB >> nonobstructive gas pattern 11/7 CTA PE/  CT abd/ pelvis >> Study is positive for segmental sized pulmonary emboli in the left lung. These appear nonocclusive at this time, and there is no associated pulmonary hemorrhage to suggest frank pulmonary infarct. Bulky soft tissue mass in the left upper quadrant with extensive lymphadenopathy in the chest and abdomen,  and evidence of widespread intraperitoneal metastatic disease. Small bilateral pleural effusions lying dependently. Aortic atherosclerosis.  Tracheobronchomalacia. Cholelithiasis. Colonic diverticulosis. 11/8 [place] Ascites/ABD >>  small volume ascites, insufficient for paracentesis, extensive omental caking and peritoneal nodularity consistent with peritoneal carcinomatosis  11/8 ECHO >> cystic structure noted in liver on subcostal images, LVEF 60-65%, no RWMA, RV systolic function normal, pericardial effusion is posterior to LV 11/8 LE Doppler >> negative for DVT  Micro Data:  SARS 2/ flu 11/7 >> neg MRSA 11/7 >> neg UA 11/7 >> negative, 5 ketones BCx2 11/9 >>   Antimicrobials:  Previously on azithro/ ceftriaxone, stopped 10/30 Cefepime 11/8 >>   Interim history/subjective:  Afebrile  On RA  Glucose range 126-173  I/O 700 ml UOP, -103 ml in last 24 hours   Objective   Blood pressure 137/65, pulse 94, temperature (!) 97.4 F (36.3 C), temperature source Axillary, resp. rate 20, height 5\' 4"  (1.626 m), weight 93.2 kg, SpO2 97 %.        Intake/Output Summary (Last 24 hours) at 05/04/2020 1148 Last data filed at 05/04/2020 0600 Gross per 24 hour  Intake 596.51 ml  Output 700 ml  Net -103.49 ml   Filed Weights   05/18/2020 1826 04/30/20 0315 05/03/20 0700  Weight: 94.9 kg 93 kg 93.2 kg   Physical Exam: General: elderly male lying in bed in NAD  HEENT: MM pink/dry, anicteric Neuro: Awake / alert, oriented, MAE / generalized weakness  CV: s1s2 RRR, no m/r/g PULM: non-labored on RA, lungs bilaterally clear GI: soft, bsx4 active  Extremities: warm/dry, 2+ BLE edema  Skin: no rashes or lesions  Resolved Hospital Problem list    Assessment & Plan:   Metastatic mantle cell lymphoma on chemotherapy Acute Hypoxemic Respiratory Failure  -pulmonary hygiene -IS -PRN BiPAP for increased work of breathing   Acute Left segmental pulmonary embolism  In setting of known malignancy -continue heparin gtt   Fever, leukopenia -continue cefepime, D5/x   -discontinue central line   Mild DKA Type 2 diabetes CBG range 139-173 -continue SSI   Acute kidney injury Hypernatremia -D5w at 40 ml/hr for 24  hours  -Trend BMP / urinary output -Replace electrolytes as indicated -Avoid nephrotoxic agents, ensure adequate renal perfusion  Pancytopenia Suspect in the setting of chemotherapy -continue heparin gtt  -no evidence of acute bleeding   Stage IV mantle cell lymphoma with LUQ mass/ extensive metastatic disease Last CHOP 10/30, follows with Dr. Irene Limbo -appreciate ONC input  -palliative radiation  -appreciate palliative care input > full scope care -PT consult / mobilize   Best practice:  Diet: NPO Pain/Anxiety/Delirium protocol (if indicated): PAD protocol  VAP protocol (if indicated): Yes DVT prophylaxis: Systemic heparin GI prophylaxis: PPI Glucose control: CBG q4h Mobility: BR Code Status: Full code Family Communication: Patient updated on plan of care 11/12 disposition: SDU, to [place] as of 11/13  Labs   CBC: Recent Labs  Lab 05/09/2020 0503 04/26/2020 2146 04/30/20 0430 04/30/20 0430 05/01/20 0500 05/02/20 0600 05/02/20 1645 05/03/20 0115 05/04/20 0530  WBC 4.3   < > 1.7*  --  1.9* 1.8*  --  2.4* 3.9*  NEUTROABS 0.7*  --   --   --   --   --   --  1.1*  --   HGB 11.5*   < > 8.3*   < > 7.0* 5.5* 7.8* 7.4* 9.4*  HCT 35.5*   < > 25.9*   < > 22.4* 17.6* 24.0* 22.6* 29.2*  MCV 86.8   < >  88.7  --  91.4 92.6  --  89.7 91.8  PLT 100*   < > 69*  --  54* 45*  --  36* 39*   < > = values in this interval not displayed.    Basic Metabolic Panel: Recent Labs  Lab 04/30/20 0430 04/30/20 1423 05/01/20 0500 05/02/20 0600 05/03/20 1131  NA 142 143 147* 143 156*  K 5.9* 5.1 4.9 6.1* 4.1  CL 107 109 110 109 120*  CO2 24 23 26  19* 22  GLUCOSE 236* 203* 143* 255* 144*  BUN 57* 66* 67* 93* 99*  CREATININE 1.76* 2.10* 1.90* 3.10* 2.10*  CALCIUM 8.2* 7.7* 7.9* 6.9* 7.0*   GFR: Estimated Creatinine Clearance: 33.7 mL/min (A) (by C-G formula based on SCr of 2.1 mg/dL (H)). Recent Labs  Lab 05/18/2020 0503 05/06/2020 0606 05/11/2020 2315 04/30/20 0430 04/30/20 0532 05/01/20 0500  05/02/20 0600 05/03/20 0115 05/04/20 0530  WBC 4.3  --    < >   < >  --  1.9* 1.8* 2.4* 3.9*  LATICACIDVEN 3.2* 2.3*  --   --  1.9  --   --   --   --    < > = values in this interval not displayed.    Liver Function Tests: Recent Labs  Lab 05/20/2020 0503 04/30/20 0430 05/03/20 1131  AST 19 19 35  ALT 18 17 20   ALKPHOS 108 70 57  BILITOT 0.9 0.4 0.8  PROT 5.9* 5.0* 5.7*  ALBUMIN 3.8 3.0* 2.8*   No results for input(s): LIPASE, AMYLASE in the last 168 hours. No results for input(s): AMMONIA in the last 168 hours.  ABG    Component Value Date/Time   PHART 7.438 05/02/2020 0755   PCO2ART 32.9 05/02/2020 0755   PO2ART 110 (H) 05/02/2020 0755   HCO3 21.9 05/02/2020 0755   TCO2 29 05/15/2020 2146   ACIDBASEDEF 1.7 05/02/2020 0755   O2SAT 98.7 05/02/2020 0755     Coagulation Profile: No results for input(s): INR, PROTIME in the last 168 hours.  Cardiac Enzymes: No results for input(s): CKTOTAL, CKMB, CKMBINDEX, TROPONINI in the last 168 hours.  HbA1C: Hgb A1c MFr Bld  Date/Time Value Ref Range Status  05/07/2020 05:49 PM 6.3 (H) 4.8 - 5.6 % Final    Comment:    (NOTE) Pre diabetes:          5.7%-6.4%  Diabetes:              >6.4%  Glycemic control for   <7.0% adults with diabetes   11/14/2019 06:13 AM 5.4 4.8 - 5.6 % Final    Comment:    (NOTE) Pre diabetes:          5.7%-6.4% Diabetes:              >6.4% Glycemic control for   <7.0% adults with diabetes     CBG: Recent Labs  Lab 05/03/20 1935 05/03/20 2315 05/04/20 0329 05/04/20 0755 05/04/20 [place], MSN, NP-C, AGACNP-BC [facility] 05/04/2020, 4:12 PM   Please see [email or web address] for pager details.

## 2020-05-05 DIAGNOSIS — E870 Hyperosmolality and hypernatremia: Secondary | ICD-10-CM

## 2020-05-05 DIAGNOSIS — N179 Acute kidney failure, unspecified: Secondary | ICD-10-CM

## 2020-05-05 DIAGNOSIS — I2699 Other pulmonary embolism without acute cor pulmonale: Secondary | ICD-10-CM | POA: Diagnosis not present

## 2020-05-05 DIAGNOSIS — J9621 Acute and chronic respiratory failure with hypoxia: Secondary | ICD-10-CM

## 2020-05-05 DIAGNOSIS — D61810 Antineoplastic chemotherapy induced pancytopenia: Secondary | ICD-10-CM | POA: Diagnosis not present

## 2020-05-05 DIAGNOSIS — D709 Neutropenia, unspecified: Secondary | ICD-10-CM

## 2020-05-05 DIAGNOSIS — E883 Tumor lysis syndrome: Secondary | ICD-10-CM | POA: Diagnosis not present

## 2020-05-05 DIAGNOSIS — Z515 Encounter for palliative care: Secondary | ICD-10-CM | POA: Diagnosis not present

## 2020-05-05 DIAGNOSIS — R5081 Fever presenting with conditions classified elsewhere: Secondary | ICD-10-CM

## 2020-05-05 DIAGNOSIS — Z7189 Other specified counseling: Secondary | ICD-10-CM | POA: Diagnosis not present

## 2020-05-05 DIAGNOSIS — R531 Weakness: Secondary | ICD-10-CM | POA: Diagnosis not present

## 2020-05-05 DIAGNOSIS — T451X5A Adverse effect of antineoplastic and immunosuppressive drugs, initial encounter: Secondary | ICD-10-CM

## 2020-05-05 DIAGNOSIS — R0602 Shortness of breath: Secondary | ICD-10-CM | POA: Diagnosis not present

## 2020-05-05 DIAGNOSIS — C8310 Mantle cell lymphoma, unspecified site: Secondary | ICD-10-CM | POA: Diagnosis not present

## 2020-05-05 LAB — GLUCOSE, CAPILLARY
Glucose-Capillary: 122 mg/dL — ABNORMAL HIGH (ref 70–99)
Glucose-Capillary: 133 mg/dL — ABNORMAL HIGH (ref 70–99)
Glucose-Capillary: 201 mg/dL — ABNORMAL HIGH (ref 70–99)
Glucose-Capillary: 195 mg/dL — ABNORMAL HIGH (ref 70–99)
Glucose-Capillary: 285 mg/dL — ABNORMAL HIGH (ref 70–99)
Glucose-Capillary: 130 mg/dL — ABNORMAL HIGH (ref 70–99)

## 2020-05-05 LAB — BASIC METABOLIC PANEL WITH GFR
Anion gap: 12 (ref 5–15)
BUN: 75 mg/dL — ABNORMAL HIGH (ref 8–23)
CO2: 23 mmol/L (ref 22–32)
Calcium: 7.1 mg/dL — ABNORMAL LOW (ref 8.9–10.3)
Chloride: 119 mmol/L — ABNORMAL HIGH (ref 98–111)
Creatinine, Ser: 1.6 mg/dL — ABNORMAL HIGH (ref 0.61–1.24)
GFR, Estimated: 46 mL/min — ABNORMAL LOW
Glucose, Bld: 270 mg/dL — ABNORMAL HIGH (ref 70–99)
Potassium: 4 mmol/L (ref 3.5–5.1)
Sodium: 154 mmol/L — ABNORMAL HIGH (ref 135–145)

## 2020-05-05 LAB — CBC
HCT: 26.8 % — ABNORMAL LOW (ref 39.0–52.0)
Hemoglobin: 8.4 g/dL — ABNORMAL LOW (ref 13.0–17.0)
MCH: 30 pg (ref 26.0–34.0)
MCHC: 31.3 g/dL (ref 30.0–36.0)
MCV: 95.7 fL (ref 80.0–100.0)
Platelets: 34 10*3/uL — ABNORMAL LOW (ref 150–400)
RBC: 2.8 MIL/uL — ABNORMAL LOW (ref 4.22–5.81)
RDW: 17.7 % — ABNORMAL HIGH (ref 11.5–15.5)
WBC: 3.1 10*3/uL — ABNORMAL LOW (ref 4.0–10.5)
nRBC: 4.5 % — ABNORMAL HIGH (ref 0.0–0.2)

## 2020-05-05 LAB — BASIC METABOLIC PANEL
Anion gap: 10 (ref 5–15)
BUN: 81 mg/dL — ABNORMAL HIGH (ref 8–23)
CO2: 24 mmol/L (ref 22–32)
Calcium: 7.3 mg/dL — ABNORMAL LOW (ref 8.9–10.3)
Chloride: 124 mmol/L — ABNORMAL HIGH (ref 98–111)
Creatinine, Ser: 1.67 mg/dL — ABNORMAL HIGH (ref 0.61–1.24)
GFR, Estimated: 44 mL/min — ABNORMAL LOW (ref >60)
Glucose, Bld: 155 mg/dL — ABNORMAL HIGH (ref 70–99)
Potassium: 3.5 mmol/L (ref 3.5–5.1)
Sodium: 158 mmol/L — ABNORMAL HIGH (ref 135–145)

## 2020-05-05 LAB — HEPARIN LEVEL (UNFRACTIONATED)
Heparin Unfractionated: 0.78 IU/mL — ABNORMAL HIGH (ref 0.30–0.70)
Heparin Unfractionated: 0.58 [IU]/mL (ref 0.30–0.70)

## 2020-05-05 LAB — RASBURICASE - URIC ACID: Uric Acid, Serum: 2.6 mg/dL — ABNORMAL LOW (ref 3.7–8.6)

## 2020-05-05 MED ORDER — BISACODYL 10 MG RE SUPP
10.0000 mg | Freq: Once | RECTAL | Status: DC
  Filled 2020-05-05: qty 1

## 2020-05-05 MED ORDER — DEXTROSE 5 % IV SOLN: INTRAVENOUS | Status: AC

## 2020-05-05 MED ORDER — CHLORHEXIDINE GLUCONATE 0.12 % MT SOLN
OROMUCOSAL | Status: AC
  Administered 2020-05-05: 15 mL via OROMUCOSAL
  Filled 2020-05-05: qty 15
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
## 2020-05-05 NOTE — Progress Notes (Signed)
ANTICOAGULATION CONSULT NOTE - Follow Up Consult  Pharmacy Consult for Heparin Indication: pulmonary embolus  Allergies  Allergen Reactions  . Ivp Dye [Iodinated Diagnostic Agents] Shortness Of Breath    "allergic to CT scan dye", took benadryl with last scan and tolerated well    Patient Measurements: Height: 5\' 4"  (162.6 cm) Weight: 93.2 kg (205 lb 7.5 oz) IBW/kg (Calculated) : 59.2 Heparin Dosing Weight: 81 kg  Vital Signs: Temp: 96.1 F (35.6 C) (11/13 1600) Temp Source: Axillary (11/13 1600) BP: 146/85 (11/13 1700) Pulse Rate: 94 (11/13 1700)  Labs: Recent Labs    05/03/20 0115 05/03/20 1131 05/04/20 0530 05/05/20 0500 05/05/20 1615  HGB 7.4*  --  9.4* 8.4*  --   HCT 22.6*  --  29.2* 26.8*  --   PLT 36*  --  39* 34*  --   HEPARINUNFRC  --    < > 0.54 0.78* 0.58  CREATININE  --    < > 1.97* 1.67* 1.60*   < > = values in this interval not displayed.    Estimated Creatinine Clearance: 44.2 mL/min (A) (by C-G formula based on SCr of 1.6 mg/dL (H)).   Medications:  Infusions:  . ceFEPime (MAXIPIME) IV Stopped (05/05/20 1638)  . dextrose 75 mL/hr at 05/05/20 1700  . heparin 1,100 Units/hr (05/05/20 1700)    Assessment: Patient is a 69 y.o M with stage IV mantle cell lymphoma on chemotherapy (got CHOP on 04/21/20), presented to the ED on 11/7 with c/o SOB, groin swelling and emesis.  Chest CT showed acute PE.  Pharmacy is consulted to start heparin drip for VTE.  Today, 05/05/2020:  Heparin level 0.58, now therapeutic on heparin 1100 units/hr  Level drawn appropriately from central line; heparin infusing peripherally  CBC: Hgb low/dropped to 8.4 (s/p transfusion 11/10), Plts 34k remain low/stable  RN reports no further bleeding nor interruptions in therapy.    Goal of Therapy:  Heparin level 0.3-0.7 units/ml Monitor platelets by anticoagulation protocol: Yes   Plan:   Continue  heparin IV infusion at 1100 units/hr  Check 8 hour confirmatory  level    Daily heparin level and CBC  Continue to monitor s/s bleeding.  Royetta Asal, PharmD, BCPS 05/05/2020 5:19 PM

## 2020-05-05 NOTE — Progress Notes (Signed)
Inpatient Rehab Admissions Coordinator Note:   Per therapy recommendations, pt was screened for CIR candidacy by Clemens Catholic, [place] CCC-SLP. During most recent therapy session, Pt. Completing only bed-level activities, so I question ability to tolerate CIR-level therapies. I will not pursue an order for CIR consult at this time; however, [facility] team will follow, monitor for progress, and pursue CIR consult if Pt. Becomes an appropriate candidate.   Clemens Catholic, [place], [place] Admissions Coordinator  [phone] ([place]) [phone] (office)

## 2020-05-05 NOTE — Progress Notes (Signed)
PROGRESS NOTE    Dean Dixon  BTD:[id number] DOB: 09-09-1950 DOA: 05/03/2020 PCP: Leonides Sake, MD   Chief Complaint  Patient presents with  . Groin Swelling  . Shortness of Breath  . Emesis    Brief Narrative:  69 year old male with stage IV mantle cell lymphoma on CHOP presenting with N/V and progressive SOB over the last 2 weeks found to have acute left segmental PE.  Admitted to [facility] however developed hypertension 250/100 with respiratory distress with chest pain, unable to tolerate BiPAP and was intubated.      History of present illness   HPI obtained from medical chart review as patient is currently intubated and sedated on mechanical ventilation.  69 year old male with prior medical history of stage IV mantle cell lymphoma (diagnosed 06/20/2019) on CHOP, hypertension, diabetes type 2 who presented to [facility] on 11/7 with progressive shortness of breath over the last 2 weeks, worse over the last 2 days with above baseline abdominal distention, non bloody nausea/ vomiting, nonradiating intermittent midsternal chest tightness, and cough with lightly productive greenish nonbloody sputum.  Additionally reported ongoing swelling in his legs and testicles causing difficulty with urination.  Recent hospitalization 10/28 - 11/1 found to have new left upper quadrant abdominal mass and hypercalcemia.    In ER, he was afebrile, hypertensive, mildly tachypneic, and tachycardic with oxygen saturations of 98% on room air.  Noted to have multiple episodes of vomiting and retching treated with zofran.  KUB with non-obstructive gas pattern.   Labs noted for glucose 290 with HgbA1c 6.3, BUN 40, AG 17, lactic acid 3.2-> 2.3, troponin hs 52, BNP 133, D-dimer 2.47, Hgb 11.5/ HCT 35.5, platelets 100, neg COVID/ flu, UA noted turbid urine with 5 ketones, negative for leukocytes/ nitrates.  CXR showed small bilateral pleural effusion with atelectasis and low lung volumes.  He was pretreated given  his contrast allergy. CTA PE and CT abd/ pelvis showed segmental nonocclusive left pulmonary embolism without associated hemorrhage or pulmonary infarct.  Additionally noted bulky soft tissue mass in LUQ with extensive lymphadenopathy in the chest and abd with evidence of widespread intraperitoneal metastatic disease, small bilateral pleural effusion, and  tracheobronchomalacia. Started on heparin gtt per pharmacy.   He was admitted by [facility] to progressive care.  In unit, he was noted to become hypertensive with BP 250/100 with complaints of chest discomfort with noted tachypnea and lethargy.  He was treated with hydralazine and placed on nitro gtt for blood pressure management.  Repeated EKG was non acute.  ABG on NRB noted 7.291/ 56.7/ 275.  Bipap was attempted however patient kept pulling it off and therefore given concern for impending respiratory failure, he was intubated by anesthesia.  PCCM consulted for further management    Assessment & Plan:   Active Problems:   Mantle cell lymphoma (HCC)   Acute on chronic respiratory failure with hypoxia (HCC)   Pulmonary embolism (HCC)   Shortness of breath   Dyspnea and respiratory abnormalities   Intubation of airway performed without difficulty   Pressure injury of skin   Abdominal distension   Tumor lysis syndrome   Antineoplastic chemotherapy induced pancytopenia (HCC)   Tumor lysis syndrome following antineoplastic drug therapy   Neutropenia with fever (HCC)   Acute renal failure (HCC)   Hypernatremia  1 acute hypoxemic respiratory failure/metastatic mantle cell lymphoma on chemotherapy/segmental left-sided PE Status post intubation and extubation.  Patient with noted segmental left-sided PE.  On empiric IV heparin for PE.  Currently on room air.  BiPAP as needed.  Hematology to advise as to when patient could be transitioned to oral DOAC versus full dose Lovenox.  Follow.  2.  Acute renal failure Likely secondary to prerenal azotemia.   Urine output of 1.150 L over the past 24 hours.  Patient on gentle hydration with D5W.  Follow.  3.  Hypernatremia Increase D5W to this is an hour.  Discontinue LR and normal saline.  Repeat labs this afternoon.  Follow.  4.  Acute left segmental PE In the setting of metastatic mantle cell lymphoma.  Lower extremity Dopplers negative for DVT.  2D echo with no right ventricular strain.  On IV heparin.  Hematology to advise as to when to transition to oral DOAC versus Lovenox.  Supportive care.  Follow.  5.  Fever/leukopenia Questionable etiology.  Chest x-ray negative for any acute infiltrates.  CT chest negative for infectious etiology.  Urinalysis unremarkable.  Blood cultures with no growth to date x4 days.  MRSA PCR negative.  COVID-19 PCR negative.  Influenza AMB negative.  Currently afebrile.  Continue IV cefepime day 6/7.  Follow.  6.  Pancytopenia Likely chemotherapy induced.  Per oncology patient received G-CSF already.  Hemoglobin 8.4.  Platelet count at 34,000.  Transfusion threshold hemoglobin < 8 per oncology recommendations and platelet count < 30,000.  Per oncology.  7.  Tumor lysis syndrome Status post rasburicase 05/01/2020 and 05/03/2020 for hyperuricemia.  Uric acid levels have decreased.  Continue allopurinol.  8.  Metastatic stage IV mantle cell lymphoma Per oncology who are following for consideration of oral chemotherapy agents for lymphoma.  Per oncology.    DVT prophylaxis: Heparin Code Status: Full Family Communication: Updated patient.  No family at bedside. Disposition:   Status is: Inpatient    Dispo: The patient is from: Home              Anticipated d/c is to: To be determined              Anticipated d/c date is: To be determined.              Patient currently on IV heparin, being treated for PE, stage IV mantle cell lymphoma being followed by oncology.  Not stable for discharge.   Significant Hospital Events   11/07 Admitted to [facility] decompensated/  intubated 11/08 Weaned on PSV, lasix with 2.2L UOP  11/09 Extubated  11/11 Fever / leukocytosis improved, on abx  11/12 Mental status improved, on RA, palliative radiation      Consultants:   PCCM 11/7  Procedures: Significant Diagnostic Tests:  11/7 KUB >> nonobstructive gas pattern 11/7 CTA PE/  CT abd/ pelvis >> Study is positive for segmental sized pulmonary emboli in the left lung. These appear nonocclusive at this time, and there is no associated pulmonary hemorrhage to suggest frank pulmonary infarct. Bulky soft tissue mass in the left upper quadrant with extensive lymphadenopathy in the chest and abdomen, and evidence of widespread intraperitoneal metastatic disease. Small bilateral pleural effusions lying dependently. Aortic atherosclerosis.  Tracheobronchomalacia. Cholelithiasis. Colonic diverticulosis. 11/8 [place] Ascites/ABD >> small volume ascites, insufficient for paracentesis, extensive omental caking and peritoneal nodularity consistent with peritoneal carcinomatosis  11/8 ECHO >> cystic structure noted in liver on subcostal images, LVEF 60-65%, no RWMA, RV systolic function normal, pericardial effusion is posterior to LV 11/8 LE Doppler >> negative for DVT  Procedures:  ETT 11/7 >> 11/9 TLC R IJ 11/8 >> 11/12    Antimicrobials:  Previously on azithro/ ceftriaxone, stopped 10/30 Cefepime 11/8 >>    Micro Data:  SARS 2/ flu 11/7 >> neg MRSA 11/7 >> neg UA 11/7 >> negative, 5 ketones BCx2 11/9 >>      Subjective: Patient with some complaints of abdominal pain.  Patient with some complaints of shortness of breath. No chest pain.  Stated had a bowel movement.  Objective: Vitals:   05/05/20 1136 05/05/20 1200 05/05/20 1300 05/05/20 1400  BP:  136/73 113/69 116/75  Pulse:  89 92 94  Resp:  19 (!) 22 19  Temp: 97.6 F (36.4 C)     TempSrc: Axillary     SpO2:  98% 100% 100%  Weight:      Height:        Intake/Output Summary (Last 24 hours) at 05/05/2020  1603 Last data filed at 05/05/2020 1400 Gross per 24 hour  Intake 2044.64 ml  Output 375 ml  Net 1669.64 ml   Filed Weights   04/27/2020 1826 04/30/20 0315 05/03/20 0700  Weight: 94.9 kg 93 kg 93.2 kg    Examination:  General exam: Appears calm and comfortable  Respiratory system: Clear to auscultation. Respiratory effort normal. Cardiovascular system: Regular rate rhythm no murmurs rubs or gallops.  No JVD.  No lower extremity edema.  Gastrointestinal system: Abdomen is soft, distended, nontender to palpation.  Positive bowel sounds.  No rebound.  No guarding.  Central nervous system: Alert and oriented. No focal neurological deficits. Extremities: Symmetric 5 x 5 power. Skin: No rashes, lesions or ulcers Psychiatry: Judgement and insight appear normal. Mood & affect appropriate.     Data Reviewed: I have personally reviewed following labs and imaging studies  CBC: Recent Labs  Lab 05/07/2020 0503 05/03/2020 2146 05/01/20 0500 05/01/20 0500 05/02/20 0600 05/02/20 1645 05/03/20 0115 05/04/20 0530 05/05/20 0500  WBC 4.3   < > 1.9*  --  1.8*  --  2.4* 3.9* 3.1*  NEUTROABS 0.7*  --   --   --   --   --  1.1*  --   --   HGB 11.5*   < > 7.0*   < > 5.5* 7.8* 7.4* 9.4* 8.4*  HCT 35.5*   < > 22.4*   < > 17.6* 24.0* 22.6* 29.2* 26.8*  MCV 86.8   < > 91.4  --  92.6  --  89.7 91.8 95.7  PLT 100*   < > 54*  --  45*  --  36* 39* 34*   < > = values in this interval not displayed.    Basic Metabolic Panel: Recent Labs  Lab 05/01/20 0500 05/02/20 0600 05/03/20 1131 05/04/20 0530 05/05/20 0500  NA 147* 143 156* 157* 158*  K 4.9 6.1* 4.1 3.7 3.5  CL 110 109 120* 119* 124*  CO2 26 19* $Remo'22 23 24  'TnJGU$ GLUCOSE 143* 255* 144* 138* 155*  BUN 67* 93* 99* 88* 81*  CREATININE 1.90* 3.10* 2.10* 1.97* 1.67*  CALCIUM 7.9* 6.9* 7.0* 7.2* 7.3*    GFR: Estimated Creatinine Clearance: 42.4 mL/min (A) (by C-G formula based on SCr of 1.67 mg/dL (H)).  Liver Function Tests: Recent Labs  Lab  05/11/2020 0503 04/30/20 0430 05/03/20 1131  AST 19 19 35  ALT $Re'18 17 20  'obL$ ALKPHOS 108 70 57  BILITOT 0.9 0.4 0.8  PROT 5.9* 5.0* 5.7*  ALBUMIN 3.8 3.0* 2.8*    CBG: Recent Labs  Lab 05/04/20 2336 05/05/20 0411 05/05/20 0805 05/05/20 1125 05/05/20 1507  GLUCAP 141*  122* 133* 201* 195*     Recent Results (from the past 240 hour(s))  Respiratory Panel by RT PCR (Flu A&B, Covid) - Nasopharyngeal Swab     Status: None   Collection Time: 04/25/2020  1:14 PM   Specimen: Nasopharyngeal Swab  Result Value Ref Range Status   SARS Coronavirus 2 by RT PCR NEGATIVE NEGATIVE Final    Comment: (NOTE) SARS-CoV-2 target nucleic acids are NOT DETECTED.  The SARS-CoV-2 RNA is generally detectable in upper respiratoy specimens during the acute phase of infection. The lowest concentration of SARS-CoV-2 viral copies this assay can detect is 131 copies/mL. A negative result does not preclude SARS-Cov-2 infection and should not be used as the sole basis for treatment or other patient management decisions. A negative result may occur with  improper specimen collection/handling, submission of specimen other than nasopharyngeal swab, presence of viral mutation(s) within the areas targeted by this assay, and inadequate number of viral copies (<131 copies/mL). A negative result must be combined with clinical observations, patient history, and epidemiological information. The expected result is Negative.  Fact Sheet for Patients:  PinkCheek.be  Fact Sheet for Healthcare Providers:  GravelBags.it  This test is no t yet approved or cleared by the [place] FDA and  has been authorized for detection and/or diagnosis of SARS-CoV-2 by FDA under an Emergency Use Authorization (EUA). This EUA will remain  in effect (meaning this test can be used) for the duration of the COVID-19 declaration under Section 564(b)(1) of the Act, 21  U.S.C. section 360bbb-3(b)(1), unless the authorization is terminated or revoked sooner.     Influenza A by PCR NEGATIVE NEGATIVE Final   Influenza B by PCR NEGATIVE NEGATIVE Final    Comment: (NOTE) The Xpert Xpress SARS-CoV-2/FLU/RSV assay is intended as an aid in  the diagnosis of influenza from Nasopharyngeal swab specimens and  should not be used as a sole basis for treatment. Nasal washings and  aspirates are unacceptable for Xpert Xpress SARS-CoV-2/FLU/RSV  testing.  Fact Sheet for Patients: PinkCheek.be  Fact Sheet for Healthcare Providers: GravelBags.it  This test is not yet approved or cleared by the [place] FDA and  has been authorized for detection and/or diagnosis of SARS-CoV-2 by  FDA under an Emergency Use Authorization (EUA). This EUA will remain  in effect (meaning this test can be used) for the duration of the  Covid-19 declaration under Section 564(b)(1) of the Act, 21  U.S.C. section 360bbb-3(b)(1), unless the authorization is  terminated or revoked. Performed at [facility], [place] [address], [place], [place] [address]   MRSA PCR Screening     Status: None   Collection Time: 05/07/2020  5:25 PM   Specimen: Nasopharyngeal  Result Value Ref Range Status   MRSA by PCR NEGATIVE NEGATIVE Final    Comment:        The GeneXpert MRSA Assay (FDA approved for NASAL specimens only), is one component of a comprehensive MRSA colonization surveillance program. It is not intended to diagnose MRSA infection nor to guide or monitor treatment for MRSA infections. Performed at [facility], [place] [address], [place], [place] [address]   Culture, blood (Routine X 2) w Reflex to ID Panel     Status: None (Preliminary result)   Collection Time: 05/01/20  8:05 AM   Specimen: BLOOD LEFT HAND  Result Value Ref Range Status   Specimen Description   Final    BLOOD LEFT  HAND Performed at [facility], [place] [address], [place], [place] [address]    Special Requests   Final    BOTTLES DRAWN AEROBIC ONLY Blood Culture adequate volume Performed at [place] [address] [place], [place] [address]    Culture   Final    NO GROWTH 4 DAYS Performed at [facility], [place] [address], [place], [place] [address]    Report Status PENDING  Incomplete  Culture, blood (Routine X 2) w Reflex to ID Panel     Status: None (Preliminary result)   Collection Time: 05/01/20  8:11 AM   Specimen: BLOOD LEFT HAND  Result Value Ref Range Status   Specimen Description   Final    BLOOD LEFT HAND Performed at [place] [address] [place], [place] [address]    Special Requests   Final    BOTTLES DRAWN AEROBIC ONLY Blood Culture results may not be optimal due to an inadequate volume of blood received in culture bottles Performed at [place] [address] [place], [place] [address]    Culture   Final    NO GROWTH 4 DAYS Performed at [facility], [place] [address] [place], [place] [address]    Report Status PENDING  Incomplete         Radiology Studies: DG Swallowing Func-Speech Pathology  Result Date: 05/04/2020 Objective Swallowing Evaluation: Type of Study: MBS-Modified Barium Swallow Study  Patient Details Name: Dean Dixon MRN: [id number] Date of Birth: [birth year] Today's Date: 05/04/2020 Time: SLP Start Time (ACUTE ONLY): 0854 -SLP Stop Time (ACUTE ONLY): 0910 SLP Time Calculation (min) (ACUTE ONLY): 16 min Past Medical History: Past Medical History: Diagnosis Date . Cancer ([place])   Mantle Cell Lymphoma . Diabetes mellitus without complication ([place])  . Hypertension  Past Surgical History: Past Surgical History: Procedure Laterality Date . APPENDECTOMY   . TONSILLECTOMY   HPI: Qualyn Oyervides is a 69 y.o. male with medical history significant of mantle cell lymphoma on  CHOP. Presenting with dyspnea and ab distention. Reports 2 weeks of shortness of breath that has worsened in the last 2 days. ETT 11/7-11/9.  Subjective: alert at bedside Assessment / Plan / Recommendation CHL IP CLINICAL IMPRESSIONS 05/04/2020 Clinical Impression Pt demonstrated oropharyngeal dysphagia with aspiration of nectar thick barium marked by delayed initiation and incompetent laryngeal closure. Barium aggregated in his valleculae and pyriform sinuses for approximately 4 seconds filling pyriforms and spilling to vocal cords prior to laryngeal closure. Subsequent trial nectar was aspirated with cough not clearing trachea. He coughed intermittently throughout without new penetrates/aspirates suspect from previous intrusion. Mild delay with honey thick with mild pharyngeal residue. Inconsistent suboptimal movement of hyoid anteriorally and superiorally. Pt clearly fatigued at onset of and during study with increased respirations and decreased endurance. Compensatory strategies not attempted given level of current ability to perform from medical standpoint. Mild lingual residue cleared with independent swallows. Recommend he initiate Dys 1 (puree), honey thick liquid cautiously with pills whole in puree, support for self feeding and rest breaks. Recommendations may be modified if there are changes to plan of care in terms of Palliative.          SLP Visit Diagnosis Dysphagia, oropharyngeal phase (R13.12) Attention and concentration deficit following -- Frontal lobe and executive function deficit following -- Impact on safety and function Moderate aspiration risk   CHL IP TREATMENT RECOMMENDATION 05/04/2020 Treatment Recommendations Therapy as outlined in treatment plan below   Prognosis 05/04/2020 Prognosis for  Safe Diet Advancement Good Barriers to Reach Goals (No Data) Barriers/Prognosis Comment -- CHL IP DIET RECOMMENDATION 05/04/2020 SLP Diet Recommendations Honey thick liquids;Dysphagia 1 (Puree) solids  Liquid Administration via Cup;No straw Medication Administration Whole meds with puree Compensations Slow rate;Small sips/bites;Other (Comment) Postural Changes Seated upright at 90 degrees   CHL IP OTHER RECOMMENDATIONS 05/04/2020 Recommended Consults -- Oral Care Recommendations Oral care BID Other Recommendations Order thickener from pharmacy   CHL IP FOLLOW UP RECOMMENDATIONS 05/04/2020 Follow up Recommendations Other (comment)   CHL IP FREQUENCY AND DURATION 05/04/2020 Speech Therapy Frequency (ACUTE ONLY) min 2x/week Treatment Duration 2 weeks      CHL IP ORAL PHASE 05/04/2020 Oral Phase Impaired Oral - Pudding Teaspoon -- Oral - Pudding Cup -- Oral - Honey Teaspoon -- Oral - Honey Cup Lingual/palatal residue Oral - Nectar Teaspoon -- Oral - Nectar Cup Lingual/palatal residue Oral - Nectar Straw -- Oral - Thin Teaspoon -- Oral - Thin Cup -- Oral - Thin Straw -- Oral - Puree -- Oral - Mech Soft -- Oral - Regular Lingual/palatal residue Oral - Multi-Consistency -- Oral - Pill -- Oral Phase - Comment --  CHL IP PHARYNGEAL PHASE 05/04/2020 Pharyngeal Phase Impaired Pharyngeal- Pudding Teaspoon -- Pharyngeal -- Pharyngeal- Pudding Cup -- Pharyngeal -- Pharyngeal- Honey Teaspoon -- Pharyngeal -- Pharyngeal- Honey Cup Delayed swallow initiation-vallecula;Pharyngeal residue - pyriform;Pharyngeal residue - valleculae Pharyngeal -- Pharyngeal- Nectar Teaspoon -- Pharyngeal -- Pharyngeal- Nectar Cup Delayed swallow initiation-pyriform sinuses;Penetration/Aspiration during swallow;Pharyngeal residue - pyriform Pharyngeal Material enters airway, remains ABOVE vocal cords and not ejected out;Material enters airway, passes BELOW cords and not ejected out despite cough attempt by patient Pharyngeal- Nectar Straw -- Pharyngeal -- Pharyngeal- Thin Teaspoon -- Pharyngeal -- Pharyngeal- Thin Cup -- Pharyngeal -- Pharyngeal- Thin Straw -- Pharyngeal -- Pharyngeal- Puree -- Pharyngeal -- Pharyngeal- Mechanical Soft -- Pharyngeal --  Pharyngeal- Regular Pharyngeal residue - posterior pharnyx Pharyngeal -- Pharyngeal- Multi-consistency -- Pharyngeal -- Pharyngeal- Pill -- Pharyngeal -- Pharyngeal Comment --  CHL IP CERVICAL ESOPHAGEAL PHASE 05/04/2020 Cervical Esophageal Phase WFL Pudding Teaspoon -- Pudding Cup -- Honey Teaspoon -- Honey Cup -- Nectar Teaspoon -- Nectar Cup -- Nectar Straw -- Thin Teaspoon -- Thin Cup -- Thin Straw -- Puree -- Mechanical Soft -- Regular -- Multi-consistency -- Pill -- Cervical Esophageal Comment -- Dean Dixon 05/04/2020, 10:56 AM Orbie Pyo Colvin Caroli.Ed Actor Pager [phone] Office [phone]              [place] EKG SITE RITE  Result Date: 05/03/2020 If Site Rite image not attached, placement could not be confirmed due to current cardiac rhythm.       Scheduled Meds: . sodium chloride   Intravenous Once  . sodium chloride   Intravenous Once  . allopurinol  100 mg Oral BID  . amLODipine  10 mg Per Tube Daily  . bisacodyl  10 mg Rectal Once  . chlorhexidine gluconate (MEDLINE KIT)  15 mL Mouth Rinse BID  . Chlorhexidine Gluconate Cloth  6 each Topical Daily  . collagenase   Topical Daily  . insulin aspart  0-15 Units Subcutaneous Q4H  . mouth rinse  15 mL Mouth Rinse 10 times per day  . pantoprazole (PROTONIX) IV  40 mg Intravenous Q24H  . sodium chloride flush  10-40 mL Intracatheter Q12H   Continuous Infusions: . ceFEPime (MAXIPIME) IV Stopped (05/05/20 0350)  . dextrose 75 mL/hr at 05/05/20 1400  . heparin 1,100 Units/hr (05/05/20 1400)     LOS:  6 days    Time spent: 40 minutes    Irine Seal, MD [facility]   To contact the attending provider between 7A-7P or the covering provider during after hours 7P-7A, please log into the web site [email or web address] and access using universal [place] password for that web site. If you do not have the password, please call the hospital operator.  05/05/2020, 4:03 PM

## 2020-05-05 NOTE — Progress Notes (Signed)
ANTICOAGULATION CONSULT NOTE - Follow Up Consult  Pharmacy Consult for Heparin Indication: pulmonary embolus  Allergies  Allergen Reactions  . Ivp Dye [Iodinated Diagnostic Agents] Shortness Of Breath    "allergic to CT scan dye", took benadryl with last scan and tolerated well    Patient Measurements: Height: 5\' 4"  (162.6 cm) Weight: 93.2 kg (205 lb 7.5 oz) IBW/kg (Calculated) : 59.2 Heparin Dosing Weight: 81 kg  Vital Signs: Temp: 98 F (36.7 C) (11/13 0428) Temp Source: Oral (11/13 0428) BP: 146/68 (11/13 0400) Pulse Rate: 90 (11/13 0400)  Labs: Recent Labs    05/02/20 0600 05/02/20 0803 05/02/20 1645 05/03/20 0100 05/03/20 0115 05/03/20 1131 05/03/20 1131 05/03/20 2038 05/04/20 0530 05/05/20 0500  HGB 5.5*   < > 7.8*  --  7.4*  --   --   --  9.4*  --   HCT 17.6*   < > 24.0*  --  22.6*  --   --   --  29.2*  --   PLT 45*  --   --   --  36*  --   --   --  39*  --   HEPARINUNFRC  --    < > 0.42   < >  --  0.35   < > 0.32 0.54 0.78*  CREATININE 3.10*   < >  --   --   --  2.10*  --   --  1.97* 1.67*   < > = values in this interval not displayed.    Estimated Creatinine Clearance: 42.4 mL/min (A) (by C-G formula based on SCr of 1.67 mg/dL (H)).   Medications:  Infusions:  . sodium chloride Stopped (05/03/20 0253)  . ceFEPime (MAXIPIME) IV Stopped (05/05/20 0350)  . dextrose 40 mL/hr at 05/05/20 0400  . heparin 1,250 Units/hr (05/05/20 0400)    Assessment: Patient is a 69 y.o M with stage IV mantle cell lymphoma on chemotherapy (got CHOP on 04/21/20), presented to the ED on 11/7 with c/o SOB, groin swelling and emesis.  Chest CT showed acute PE.  Pharmacy is consulted to start heparin drip for VTE.  Today, 05/05/2020: Heparin level 0.78, now supra-therapeutic on heparin 1250 units/hr Level drawn appropriately from central line; heparin infusing peripherally CBC: Hgb low/dropped to 8.4 (s/p transfusion 11/10), Plts 34k remain low/stable RN reports no further  bleeding nor interruptions in therapy.    Goal of Therapy:  Heparin level 0.3-0.7 units/ml Monitor platelets by anticoagulation protocol: Yes   Plan:   Continue heparin IV infusion at 1250 units/hr  Daily heparin level and CBC  Continue to monitor s/s bleeding.  Netta Cedars PharmD, BCPS Clinical Pharmacist [facility] [phone] 05/05/2020 5:57 AM

## 2020-05-05 NOTE — Progress Notes (Signed)
05/05/2020 Patient found pulling leads off and grasping central line, with dressing dislodged. Patient states he "needs to go home". Reoriented patient, who states he feels he doesn't need to be here. Patient asked for his cell phone to call Leory Plowman, however, phone had low battery charge with no charger in patient's possessions. Patient relented after discussion with charge nurse and understands that he needs to remain here for medical care. Patient very apologetic and states that he will comply with his medical care. Thanked patient for his cooperation and provided reassurance. Cindy S. Brigitte Pulse BSN, RN, CCRP 05/05/2020 11:34 PM

## 2020-05-05 NOTE — Progress Notes (Signed)
ANTICOAGULATION CONSULT NOTE - Follow Up Consult  Pharmacy Consult for Heparin Indication: pulmonary embolus  Allergies  Allergen Reactions  . Ivp Dye [Iodinated Diagnostic Agents] Shortness Of Breath    "allergic to CT scan dye", took benadryl with last scan and tolerated well    Patient Measurements: Height: 5\' 4"  (162.6 cm) Weight: 93.2 kg (205 lb 7.5 oz) IBW/kg (Calculated) : 59.2 Heparin Dosing Weight: 81 kg  Vital Signs: Temp: 98 F (36.7 C) (11/13 0428) Temp Source: Oral (11/13 0428) BP: 141/64 (11/13 0700) Pulse Rate: 85 (11/13 0700)  Labs: Recent Labs    05/03/20 0100 05/03/20 0115 05/03/20 0115 05/03/20 1131 05/03/20 1131 05/03/20 2038 05/04/20 0530 05/05/20 0500  HGB  --  7.4*   < >  --   --   --  9.4* 8.4*  HCT  --  22.6*  --   --   --   --  29.2* 26.8*  PLT  --  36*  --   --   --   --  39* 34*  HEPARINUNFRC   < >  --   --  0.35   < > 0.32 0.54 0.78*  CREATININE  --   --   --  2.10*  --   --  1.97* 1.67*   < > = values in this interval not displayed.    Estimated Creatinine Clearance: 42.4 mL/min (A) (by C-G formula based on SCr of 1.67 mg/dL (H)).   Medications:  Infusions:  . sodium chloride Stopped (05/03/20 0253)  . ceFEPime (MAXIPIME) IV Stopped (05/05/20 0350)  . dextrose 40 mL/hr at 05/05/20 0700  . heparin 1,100 Units/hr (05/05/20 0700)    Assessment: Patient is a 69 y.o M with stage IV mantle cell lymphoma on chemotherapy (got CHOP on 04/21/20), presented to the ED on 11/7 with c/o SOB, groin swelling and emesis.  Chest CT showed acute PE.  Pharmacy is consulted to start heparin drip for VTE.  Today, 05/05/2020: Heparin level 0.78, now supra-therapeutic on heparin 1250 units/hr Level drawn appropriately from central line; heparin infusing peripherally CBC: Hgb low/dropped to 8.4 (s/p transfusion 11/10), Plts 34k remain low/stable RN reports no further bleeding nor interruptions in therapy.    Goal of Therapy:  Heparin level 0.3-0.7  units/ml Monitor platelets by anticoagulation protocol: Yes   Plan:   Decrease to heparin IV infusion at 1100 units/hr  Check 8 hour level  Daily heparin level and CBC  Continue to monitor s/s bleeding.  Gretta Arab PharmD, BCPS Clinical Pharmacist [facility] [phone] 05/05/2020 7:21 AM

## 2020-05-06 ENCOUNTER — Inpatient Hospital Stay (HOSPITAL_COMMUNITY): Payer: Medicare Other

## 2020-05-06 DIAGNOSIS — R0602 Shortness of breath: Secondary | ICD-10-CM

## 2020-05-06 DIAGNOSIS — R0600 Dyspnea, unspecified: Secondary | ICD-10-CM

## 2020-05-06 DIAGNOSIS — K921 Melena: Secondary | ICD-10-CM | POA: Diagnosis not present

## 2020-05-06 LAB — GLUCOSE, CAPILLARY
Glucose-Capillary: 111 mg/dL — ABNORMAL HIGH (ref 70–99)
Glucose-Capillary: 126 mg/dL — ABNORMAL HIGH (ref 70–99)
Glucose-Capillary: 132 mg/dL — ABNORMAL HIGH (ref 70–99)
Glucose-Capillary: 151 mg/dL — ABNORMAL HIGH (ref 70–99)
Glucose-Capillary: 168 mg/dL — ABNORMAL HIGH (ref 70–99)
Glucose-Capillary: 92 mg/dL (ref 70–99)

## 2020-05-06 LAB — CBC
HCT: 25.3 % — ABNORMAL LOW (ref 39.0–52.0)
Hemoglobin: 7.9 g/dL — ABNORMAL LOW (ref 13.0–17.0)
MCH: 29.7 pg (ref 26.0–34.0)
MCHC: 31.2 g/dL (ref 30.0–36.0)
MCV: 95.1 fL (ref 80.0–100.0)
Platelets: 34 10*3/uL — ABNORMAL LOW (ref 150–400)
RBC: 2.66 MIL/uL — ABNORMAL LOW (ref 4.22–5.81)
RDW: 17.2 % — ABNORMAL HIGH (ref 11.5–15.5)
WBC: 2.9 10*3/uL — ABNORMAL LOW (ref 4.0–10.5)
nRBC: 4.1 % — ABNORMAL HIGH (ref 0.0–0.2)

## 2020-05-06 LAB — HEPARIN LEVEL (UNFRACTIONATED): Heparin Unfractionated: 0.51 IU/mL (ref 0.30–0.70)

## 2020-05-06 LAB — URINE CULTURE: Culture: NO GROWTH

## 2020-05-06 LAB — COMPREHENSIVE METABOLIC PANEL
ALT: 15 U/L (ref 0–44)
AST: 20 U/L (ref 15–41)
Albumin: 2.7 g/dL — ABNORMAL LOW (ref 3.5–5.0)
Alkaline Phosphatase: 65 U/L (ref 38–126)
Anion gap: 10 (ref 5–15)
BUN: 71 mg/dL — ABNORMAL HIGH (ref 8–23)
CO2: 22 mmol/L (ref 22–32)
Calcium: 7 mg/dL — ABNORMAL LOW (ref 8.9–10.3)
Chloride: 116 mmol/L — ABNORMAL HIGH (ref 98–111)
Creatinine, Ser: 1.44 mg/dL — ABNORMAL HIGH (ref 0.61–1.24)
GFR, Estimated: 52 mL/min — ABNORMAL LOW (ref >60)
Glucose, Bld: 118 mg/dL — ABNORMAL HIGH (ref 70–99)
Potassium: 3.2 mmol/L — ABNORMAL LOW (ref 3.5–5.1)
Sodium: 148 mmol/L — ABNORMAL HIGH (ref 135–145)
Total Bilirubin: 0.5 mg/dL (ref 0.3–1.2)
Total Protein: 5.2 g/dL — ABNORMAL LOW (ref 6.5–8.1)

## 2020-05-06 LAB — OCCULT BLOOD X 1 CARD TO LAB, STOOL: Fecal Occult Bld: POSITIVE — AB

## 2020-05-06 LAB — CULTURE, BLOOD (ROUTINE X 2)
Culture: NO GROWTH
Culture: NO GROWTH
Special Requests: ADEQUATE

## 2020-05-06 LAB — RASBURICASE - URIC ACID: Uric Acid, Serum: 1.7 mg/dL — ABNORMAL LOW (ref 3.7–8.6)

## 2020-05-06 MED ORDER — DEXTROSE 5 % IV SOLN: INTRAVENOUS | Status: DC

## 2020-05-06 MED ORDER — SODIUM CHLORIDE 0.9 % IV SOLN
80.0000 mg | Freq: Once | INTRAVENOUS | Status: AC
  Administered 2020-05-06: 80 mg via INTRAVENOUS
  Filled 2020-05-06: qty 80

## 2020-05-06 MED ORDER — PANTOPRAZOLE SODIUM 40 MG IV SOLR
40.0000 mg | Freq: Two times a day (BID) | INTRAVENOUS | Status: DC
  Administered 2020-05-07 – 2020-05-10 (×7): 40 mg via INTRAVENOUS
  Filled 2020-05-06 (×7): qty 40

## 2020-05-06 MED ORDER — POTASSIUM CHLORIDE 20 MEQ PO PACK
40.0000 meq | PACK | Freq: Once | ORAL | Status: AC
  Administered 2020-05-06: 40 meq via ORAL
  Filled 2020-05-06: qty 2

## 2020-05-06 MED ORDER — FUROSEMIDE 10 MG/ML IJ SOLN: 20.0000 mg | Freq: Once | INTRAMUSCULAR | Status: DC

## 2020-05-06 MED ORDER — FUROSEMIDE 10 MG/ML IJ SOLN: 20.0000 mg | Freq: Two times a day (BID) | INTRAMUSCULAR | Status: DC

## 2020-05-06 MED ORDER — FUROSEMIDE 10 MG/ML IJ SOLN
20.0000 mg | Freq: Once | INTRAMUSCULAR | Status: AC
  Administered 2020-05-06: 20 mg via INTRAVENOUS
  Filled 2020-05-06: qty 2

## 2020-05-06 MED ORDER — ORAL CARE MOUTH RINSE
15.0000 mL | Freq: Two times a day (BID) | OROMUCOSAL | Status: DC
  Administered 2020-05-06 – 2020-05-11 (×10): 15 mL via OROMUCOSAL

## 2020-05-06 NOTE — Progress Notes (Signed)
Pt requesting to come off BIPAP at this time.  Pt currently on room air and tolerating well, RN aware.  RT to monitor and assess as needed.

## 2020-05-06 NOTE — Progress Notes (Signed)
Nurse call and stated that the Pt said he wasn't breathing good. RT started Pt on BIPAP and asked the nurse to ask the doctor to order a chest xray. Pt stated the BIPAP was helping. RT will continue to monitor.

## 2020-05-06 NOTE — Progress Notes (Signed)
05/06/2020 Spoke with pharmacist, Sharyn Lull, to notify her that patient's heparin tubing was found disconnected in the bed, presumably following the 2200 incident of patient attempting to remove lines and equipment. Orders given to hold drawing the heparin level at this time and to wait until AM draw between 0500 and 0600. Cindy S. Brigitte Pulse BSN, RN, [place] 05/06/2020 12:39 AM

## 2020-05-06 NOTE — Progress Notes (Signed)
Pt now complaining that he "can't breathe." Oxygen saturations remain greater than 92%. Crackles present upon auscultation of lung sounds. Respiratory therapist called. Dr. Grandville Silos notified. New orders received.

## 2020-05-06 NOTE — Progress Notes (Addendum)
Was called by RN that patient with some complaints of shortness of breath although with sats of 99% on room air.  Chest x-ray ordered.  Patient was placed on the BiPAP. Went to assess patient. General: On BiPAP. Respiratory: Some scattered diffuse crackles.  No wheezing.  No rhonchi. Cardiovascular: Regular rate rhythm no murmurs rubs or gallops Abdomen: Mildly distended, soft, nontender to palpation, positive bowel sounds GU: Rectal pouch with melanotic stool noted. Extremities: No clubbing no cyanosis 2+ bilateral lower extremity edema  Assessment/plan 1.  Dyspnea concern for volume overload Patient with complaints of dyspnea.  Concern for volume overload.  On examination patient with diffuse callus crackles and bilateral lower extremity edema.  Patient was on D5W due to hypernatremia.  Check a chest x-ray.  Saline lock IV fluids.  Lasix 20 mg IV x1.  Strict I's and O's.  Daily weights.  BiPAP as needed.  Follow.  2.  Melanotic stools Patient noted to have melanotic stools in rectal pouch.  Patient denies any abdominal pain.  Patient on IV heparin secondary to acute PE.  Check FOBT.  Check H&H.  Protonix 80 mg IV push x1.  Protonix 40 mg IV every 12 hours.  Make patient n.p.o.  Transfusion threshold hemoglobin < 7.  Hold heparin.  Consult with GI for further evaluation and management.  Spoke with Dr. Loletha Carrow III, and patient will be assessed in the morning for further evaluation and possible endoscopy.

## 2020-05-06 NOTE — Progress Notes (Addendum)
ANTICOAGULATION CONSULT NOTE - Follow Up Consult  Pharmacy Consult for Heparin Indication: pulmonary embolus  Allergies  Allergen Reactions  . Ivp Dye [Iodinated Diagnostic Agents] Shortness Of Breath    "allergic to CT scan dye", took benadryl with last scan and tolerated well    Patient Measurements: Height: 5\' 4"  (162.6 cm) Weight: 93.2 kg (205 lb 7.5 oz) IBW/kg (Calculated) : 59.2 Heparin Dosing Weight: 81 kg  Vital Signs: Temp: 97.4 F (36.3 C) (11/14 0400) Temp Source: Axillary (11/14 0030) BP: 155/77 (11/14 0540) Pulse Rate: 84 (11/14 0540)  Labs: Recent Labs    05/04/20 0530 05/04/20 0530 05/05/20 0500 05/05/20 1615 05/06/20 0500 05/06/20 0600  HGB 9.4*   < > 8.4*  --  7.9*  --   HCT 29.2*  --  26.8*  --  25.3*  --   PLT 39*  --  34*  --  34*  --   HEPARINUNFRC 0.54   < > 0.78* 0.58  --  0.51  CREATININE 1.97*  --  1.67* 1.60*  --   --    < > = values in this interval not displayed.    Estimated Creatinine Clearance: 44.2 mL/min (A) (by C-G formula based on SCr of 1.6 mg/dL (H)).   Medications:  Infusions:  . ceFEPime (MAXIPIME) IV Stopped (05/06/20 0302)  . dextrose 75 mL/hr at 05/06/20 0600  . heparin 1,100 Units/hr (05/06/20 0600)    Assessment: Patient is a 69 y.o M with stage IV mantle cell lymphoma on chemotherapy (got CHOP on 04/21/20), presented to the ED on 11/7 with c/o SOB, groin swelling and emesis.  Chest CT showed acute PE.  Pharmacy is consulted to start heparin drip for VTE.  Today, 05/06/2020:  Heparin level 0.51, therapeutic on heparin 1100 units/hr  Level drawn appropriately from central line; heparin infusing peripherally  CBC: Hgb low/dropped to 7.9 (s/p transfusion 11/10), Plts 34k remain low/stable  RN reports no further bleeding.  Heparin was off ~ 2h last PM due to patient pulling out access but no issues since restarting heparin ~2345.    Goal of Therapy:  Heparin level 0.3-0.7 units/ml Monitor platelets by  anticoagulation protocol: Yes   Plan:   Continue  heparin IV infusion at 1100 units/hr  Daily heparin level and CBC  Continue to monitor s/s bleeding.  Netta Cedars, PharmD, BCPS 05/06/2020 7:02 AM

## 2020-05-06 NOTE — Progress Notes (Addendum)
PROGRESS NOTE    Dean Dixon  BTD:[id number] DOB: 09-09-1950 DOA: 04/30/2020 PCP: Leonides Sake, MD   Chief Complaint  Patient presents with  . Groin Swelling  . Shortness of Breath  . Emesis    Brief Narrative:  69 year old male with stage IV mantle cell lymphoma on CHOP presenting with N/V and progressive SOB over the last 2 weeks found to have acute left segmental PE.  Admitted to [facility] however developed hypertension 250/100 with respiratory distress with chest pain, unable to tolerate BiPAP and was intubated.      History of present illness   HPI obtained from medical chart review as patient is currently intubated and sedated on mechanical ventilation.  69 year old male with prior medical history of stage IV mantle cell lymphoma (diagnosed 06/20/2019) on CHOP, hypertension, diabetes type 2 who presented to [facility] on 11/7 with progressive shortness of breath over the last 2 weeks, worse over the last 2 days with above baseline abdominal distention, non bloody nausea/ vomiting, nonradiating intermittent midsternal chest tightness, and cough with lightly productive greenish nonbloody sputum.  Additionally reported ongoing swelling in his legs and testicles causing difficulty with urination.  Recent hospitalization 10/28 - 11/1 found to have new left upper quadrant abdominal mass and hypercalcemia.    In ER, he was afebrile, hypertensive, mildly tachypneic, and tachycardic with oxygen saturations of 98% on room air.  Noted to have multiple episodes of vomiting and retching treated with zofran.  KUB with non-obstructive gas pattern.   Labs noted for glucose 290 with HgbA1c 6.3, BUN 40, AG 17, lactic acid 3.2-> 2.3, troponin hs 52, BNP 133, D-dimer 2.47, Hgb 11.5/ HCT 35.5, platelets 100, neg COVID/ flu, UA noted turbid urine with 5 ketones, negative for leukocytes/ nitrates.  CXR showed small bilateral pleural effusion with atelectasis and low lung volumes.  He was pretreated given  his contrast allergy. CTA PE and CT abd/ pelvis showed segmental nonocclusive left pulmonary embolism without associated hemorrhage or pulmonary infarct.  Additionally noted bulky soft tissue mass in LUQ with extensive lymphadenopathy in the chest and abd with evidence of widespread intraperitoneal metastatic disease, small bilateral pleural effusion, and  tracheobronchomalacia. Started on heparin gtt per pharmacy.   He was admitted by [facility] to progressive care.  In unit, he was noted to become hypertensive with BP 250/100 with complaints of chest discomfort with noted tachypnea and lethargy.  He was treated with hydralazine and placed on nitro gtt for blood pressure management.  Repeated EKG was non acute.  ABG on NRB noted 7.291/ 56.7/ 275.  Bipap was attempted however patient kept pulling it off and therefore given concern for impending respiratory failure, he was intubated by anesthesia.  PCCM consulted for further management    Assessment & Plan:   Active Problems:   Mantle cell lymphoma (HCC)   Acute on chronic respiratory failure with hypoxia (HCC)   Pulmonary embolism (HCC)   Shortness of breath   Dyspnea and respiratory abnormalities   Intubation of airway performed without difficulty   Pressure injury of skin   Abdominal distension   Tumor lysis syndrome   Antineoplastic chemotherapy induced pancytopenia (HCC)   Tumor lysis syndrome following antineoplastic drug therapy   Neutropenia with fever (HCC)   Acute renal failure (HCC)   Hypernatremia  1 acute hypoxemic respiratory failure/metastatic mantle cell lymphoma on chemotherapy/segmental left-sided PE Status post intubation and extubation.  Patient with noted segmental left-sided PE.  On empiric IV heparin for PE.  Currently on room air with sats of 98 to 100%.  BiPAP as needed.  Hematology to advise as to when patient could be transitioned to oral DOAC versus full dose Lovenox.  Follow.  2.  Acute renal failure Likely secondary  to prerenal azotemia.  Urine output of 1.225 L over the past 24 hours.  Creatinine improving currently at 1.44.  Continue gentle hydration.  Follow.    3.  Hypernatremia Improving on current rate of D5W.  LR normal saline discontinued.  Follow.   4.  Acute left segmental PE In the setting of metastatic mantle cell lymphoma.  Lower extremity Dopplers negative for DVT.  2D echo with no right ventricular strain.  On IV heparin.  Hematology to advise as to when to transition to oral DOAC versus Lovenox.  Supportive care.  Follow.  5.  Fever/leukopenia Questionable etiology.  Chest x-ray negative for any acute infiltrates.  CT chest negative for infectious etiology.  Urinalysis unremarkable.  Blood cultures with no growth to date x5 days.  MRSA PCR negative.  COVID-19 PCR negative.  Influenza A and B negative.  Currently afebrile.  Continue IV cefepime d7/7.  Follow.   6.  Pancytopenia Likely chemotherapy induced.  Per oncology patient received G-CSF already.  WBC at 2.9.  Hemoglobin 7.9.  Platelet count at 34,000.  Transfusion threshold hemoglobin < 8 per oncology recommendations and platelet count < 30,000.  We will hold off on transfusion of packed red blood cells today.  Per oncology.  7.  Tumor lysis syndrome Status post rasburicase 05/01/2020 and 05/03/2020 for hyperuricemia.  Acid level at 1.7.  Allopurinol.    8.  Metastatic stage IV mantle cell lymphoma Per oncology who are following for consideration of oral chemotherapy agents for lymphoma.  Per oncology.  9.  Hypokalemia K. Dur 40 mEq p.o. x1.    DVT prophylaxis: Heparin Code Status: Full Family Communication: Updated patient.  No family at bedside. Disposition:   Status is: Inpatient    Dispo: The patient is from: Home              Anticipated d/c is to: To be determined              Anticipated d/c date is: To be determined.              Patient currently on IV heparin, being treated for PE, stage IV mantle cell lymphoma  being followed by oncology.  Not stable for discharge.   Significant Hospital Events   11/07 Admitted to [facility] decompensated/ intubated 11/08 Weaned on PSV, lasix with 2.2L UOP  11/09 Extubated  11/11 Fever / leukocytosis improved, on abx  11/12 Mental status improved, on RA, palliative radiation      Consultants:   PCCM 11/7  Procedures: Significant Diagnostic Tests:  11/7 KUB >> nonobstructive gas pattern 11/7 CTA PE/  CT abd/ pelvis >> Study is positive for segmental sized pulmonary emboli in the left lung. These appear nonocclusive at this time, and there is no associated pulmonary hemorrhage to suggest frank pulmonary infarct. Bulky soft tissue mass in the left upper quadrant with extensive lymphadenopathy in the chest and abdomen, and evidence of widespread intraperitoneal metastatic disease. Small bilateral pleural effusions lying dependently. Aortic atherosclerosis.  Tracheobronchomalacia. Cholelithiasis. Colonic diverticulosis. 11/8 [place] Ascites/ABD >> small volume ascites, insufficient for paracentesis, extensive omental caking and peritoneal nodularity consistent with peritoneal carcinomatosis  11/8 ECHO >> cystic structure noted in liver on subcostal images, LVEF 60-65%, no RWMA, RV  systolic function normal, pericardial effusion is posterior to LV 11/8 LE Doppler >> negative for DVT  Procedures:  ETT 11/7 >> 11/9 TLC R IJ 11/8 >> 11/12    Antimicrobials:   Previously on azithro/ ceftriaxone, stopped 10/30 Cefepime 11/8 >>    Micro Data:  SARS 2/ flu 11/7 >> neg MRSA 11/7 >> neg UA 11/7 >> negative, 5 ketones BCx2 11/9 >>      Subjective: Sitting up in bed.  States he is feeling better.  Denies any chest pain or shortness of breath.  Denies any abdominal pain.  Stated he is tolerating dysphagia 1 diet although he does not like it.   Objective: Vitals:   05/06/20 0200 05/06/20 0400 05/06/20 0540 05/06/20 0800  BP:  (!) 149/101 (!) 155/77 (!) 144/62  Pulse:  90 90 84 83  Resp: 20 19 20 18   Temp:  (!) 97.4 F (36.3 C)  (!) 97.5 F (36.4 C)  TempSrc:    Oral  SpO2: 100% 99% 100% 100%  Weight:      Height:        Intake/Output Summary (Last 24 hours) at 05/06/2020 0947 Last data filed at 05/06/2020 0800 Gross per 24 hour  Intake 3862.51 ml  Output 1825 ml  Net 2037.51 ml   Filed Weights   05/14/2020 1826 04/30/20 0315 05/03/20 0700  Weight: 94.9 kg 93 kg 93.2 kg    Examination:  General exam: Appears calm and comfortable  Respiratory system: Lungs clear to auscultation bilaterally.  No wheezes, no crackles, no rhonchi.  Normal respiratory effort.   Cardiovascular system: RRR no murmurs rubs or gallops.  No JVD.  No lower extremity edema.  Gastrointestinal system: Abdomen is soft, distended, nontender to palpation, positive bowel sounds.  No rebound.  No guarding.  Central nervous system: Alert and oriented. No focal neurological deficits. Extremities: Symmetric 5 x 5 power. Skin: No rashes, lesions or ulcers Psychiatry: Judgement and insight appear normal. Mood & affect appropriate.     Data Reviewed: I have personally reviewed following labs and imaging studies  CBC: Recent Labs  Lab 05/02/20 0600 05/02/20 0600 05/02/20 1645 05/03/20 0115 05/04/20 0530 05/05/20 0500 05/06/20 0500  WBC 1.8*  --   --  2.4* 3.9* 3.1* 2.9*  NEUTROABS  --   --   --  1.1*  --   --   --   HGB 5.5*   < > 7.8* 7.4* 9.4* 8.4* 7.9*  HCT 17.6*   < > 24.0* 22.6* 29.2* 26.8* 25.3*  MCV 92.6  --   --  89.7 91.8 95.7 95.1  PLT 45*  --   --  36* 39* 34* 34*   < > = values in this interval not displayed.    Basic Metabolic Panel: Recent Labs  Lab 05/03/20 1131 05/04/20 0530 05/05/20 0500 05/05/20 1615 05/06/20 0500  NA 156* 157* 158* 154* 148*  K 4.1 3.7 3.5 4.0 3.2*  CL 120* 119* 124* 119* 116*  CO2 22 23 24 23 22   GLUCOSE 144* 138* 155* 270* 118*  BUN 99* 88* 81* 75* 71*  CREATININE 2.10* 1.97* 1.67* 1.60* 1.44*  CALCIUM 7.0* 7.2* 7.3*  7.1* 7.0*    GFR: Estimated Creatinine Clearance: 49.2 mL/min (A) (by C-G formula based on SCr of 1.44 mg/dL (H)).  Liver Function Tests: Recent Labs  Lab 04/30/20 0430 05/03/20 1131 05/06/20 0500  AST 19 35 20  ALT 17 20 15   ALKPHOS 70 57 65  BILITOT 0.4 0.8  0.5  PROT 5.0* 5.7* 5.2*  ALBUMIN 3.0* 2.8* 2.7*    CBG: Recent Labs  Lab 05/05/20 1507 05/05/20 1926 05/05/20 2308 05/06/20 0317 05/06/20 0744  GLUCAP 195* 285* 130* 92 111*     Recent Results (from the past 240 hour(s))  Respiratory Panel by RT PCR (Flu A&B, Covid) - Nasopharyngeal Swab     Status: None   Collection Time: 05/20/2020  1:14 PM   Specimen: Nasopharyngeal Swab  Result Value Ref Range Status   SARS Coronavirus 2 by RT PCR NEGATIVE NEGATIVE Final    Comment: (NOTE) SARS-CoV-2 target nucleic acids are NOT DETECTED.  The SARS-CoV-2 RNA is generally detectable in upper respiratoy specimens during the acute phase of infection. The lowest concentration of SARS-CoV-2 viral copies this assay can detect is 131 copies/mL. A negative result does not preclude SARS-Cov-2 infection and should not be used as the sole basis for treatment or other patient management decisions. A negative result may occur with  improper specimen collection/handling, submission of specimen other than nasopharyngeal swab, presence of viral mutation(s) within the areas targeted by this assay, and inadequate number of viral copies (<131 copies/mL). A negative result must be combined with clinical observations, patient history, and epidemiological information. The expected result is Negative.  Fact Sheet for Patients:  PinkCheek.be  Fact Sheet for Healthcare Providers:  GravelBags.it  This test is no t yet approved or cleared by the [place] FDA and  has been authorized for detection and/or diagnosis of SARS-CoV-2 by FDA under an Emergency Use Authorization (EUA).  This EUA will remain  in effect (meaning this test can be used) for the duration of the COVID-19 declaration under Section 564(b)(1) of the Act, 21 U.S.C. section 360bbb-3(b)(1), unless the authorization is terminated or revoked sooner.     Influenza A by PCR NEGATIVE NEGATIVE Final   Influenza B by PCR NEGATIVE NEGATIVE Final    Comment: (NOTE) The Xpert Xpress SARS-CoV-2/FLU/RSV assay is intended as an aid in  the diagnosis of influenza from Nasopharyngeal swab specimens and  should not be used as a sole basis for treatment. Nasal washings and  aspirates are unacceptable for Xpert Xpress SARS-CoV-2/FLU/RSV  testing.  Fact Sheet for Patients: PinkCheek.be  Fact Sheet for Healthcare Providers: GravelBags.it  This test is not yet approved or cleared by the [place] FDA and  has been authorized for detection and/or diagnosis of SARS-CoV-2 by  FDA under an Emergency Use Authorization (EUA). This EUA will remain  in effect (meaning this test can be used) for the duration of the  Covid-19 declaration under Section 564(b)(1) of the Act, 21  U.S.C. section 360bbb-3(b)(1), unless the authorization is  terminated or revoked. Performed at [facility], [place] [address], [place], [place] [address]   MRSA PCR Screening     Status: None   Collection Time: 04/26/2020  5:25 PM   Specimen: Nasopharyngeal  Result Value Ref Range Status   MRSA by PCR NEGATIVE NEGATIVE Final    Comment:        The GeneXpert MRSA Assay (FDA approved for NASAL specimens only), is one component of a comprehensive MRSA colonization surveillance program. It is not intended to diagnose MRSA infection nor to guide or monitor treatment for MRSA infections. Performed at [facility], [place] [address], [place], [place] [address]   Culture, blood (Routine X 2) w Reflex to ID Panel     Status: None (Preliminary result)    Collection Time: 05/01/20  8:05 AM   Specimen: BLOOD LEFT HAND  Result Value Ref Range Status   Specimen Description   Final    BLOOD LEFT HAND Performed at [place] [address], [place], [place] [address]    Special Requests   Final    BOTTLES DRAWN AEROBIC ONLY Blood Culture adequate volume Performed at [place] [address], [place], [place] [address]    Culture   Final    NO GROWTH 4 DAYS Performed at [facility], [place] [address] [place], [place] [address]    Report Status PENDING  Incomplete  Culture, blood (Routine X 2) w Reflex to ID Panel     Status: None (Preliminary result)   Collection Time: 05/01/20  8:11 AM   Specimen: BLOOD LEFT HAND  Result Value Ref Range Status   Specimen Description   Final    BLOOD LEFT HAND Performed at [place] [address], [place], [place] [address]    Special Requests   Final    BOTTLES DRAWN AEROBIC ONLY Blood Culture results may not be optimal due to an inadequate volume of blood received in culture bottles Performed at [place] [address], [place], [place] [address]    Culture   Final    NO GROWTH 4 DAYS Performed at [facility], [place] [address], [place], [place] [address]    Report Status PENDING  Incomplete         Radiology Studies: No results found.      Scheduled Meds: . sodium chloride   Intravenous Once  . sodium chloride   Intravenous Once  . allopurinol  100 mg Oral BID  . amLODipine  10 mg Per Tube Daily  . bisacodyl  10 mg Rectal Once  . Chlorhexidine Gluconate Cloth  6 each Topical Daily  . collagenase   Topical Daily  . insulin aspart  0-15 Units Subcutaneous Q4H  . mouth rinse  15 mL Mouth Rinse BID  . pantoprazole (PROTONIX) IV  40 mg Intravenous Q24H  . potassium chloride  40 mEq Oral Once  . sodium chloride flush  10-40 mL Intracatheter Q12H   Continuous Infusions: . ceFEPime  (MAXIPIME) IV Stopped (05/06/20 0302)  . heparin 1,100 Units/hr (05/06/20 0850)     LOS: 7 days    Time spent: 40 minutes    Irine Seal, MD [facility]   To contact the attending provider between 7A-7P or the covering provider during after hours 7P-7A, please log into the web site [email or web address] and access using universal [place] password for that web site. If you do not have the password, please call the hospital operator.  05/06/2020, 9:47 AM

## 2020-05-07 ENCOUNTER — Ambulatory Visit
Admit: 2020-05-07 | Discharge: 2020-05-07 | Disposition: A | Discharge status: Home / Self Care | Payer: Medicare Other | Attending: Radiation Oncology | Admitting: Radiation Oncology

## 2020-05-07 DIAGNOSIS — C8318 Mantle cell lymphoma, lymph nodes of multiple sites: Secondary | ICD-10-CM | POA: Diagnosis not present

## 2020-05-07 DIAGNOSIS — R195 Other fecal abnormalities: Secondary | ICD-10-CM | POA: Diagnosis not present

## 2020-05-07 DIAGNOSIS — I2699 Other pulmonary embolism without acute cor pulmonale: Secondary | ICD-10-CM | POA: Diagnosis not present

## 2020-05-07 DIAGNOSIS — C8310 Mantle cell lymphoma, unspecified site: Secondary | ICD-10-CM | POA: Diagnosis not present

## 2020-05-07 DIAGNOSIS — D61810 Antineoplastic chemotherapy induced pancytopenia: Secondary | ICD-10-CM | POA: Diagnosis not present

## 2020-05-07 DIAGNOSIS — E883 Tumor lysis syndrome: Secondary | ICD-10-CM | POA: Diagnosis not present

## 2020-05-07 LAB — GLUCOSE, CAPILLARY
Glucose-Capillary: 96 mg/dL (ref 70–99)
Glucose-Capillary: 101 mg/dL — ABNORMAL HIGH (ref 70–99)
Glucose-Capillary: 134 mg/dL — ABNORMAL HIGH (ref 70–99)
Glucose-Capillary: 149 mg/dL — ABNORMAL HIGH (ref 70–99)
Glucose-Capillary: 141 mg/dL — ABNORMAL HIGH (ref 70–99)
Glucose-Capillary: 125 mg/dL — ABNORMAL HIGH (ref 70–99)

## 2020-05-07 LAB — CBC WITH DIFFERENTIAL/PLATELET
Abs Immature Granulocytes: 0.22 10*3/uL — ABNORMAL HIGH (ref 0.00–0.07)
Basophils Absolute: 0 10*3/uL (ref 0.0–0.1)
Basophils Relative: 0 %
Eosinophils Absolute: 0 10*3/uL (ref 0.0–0.5)
Eosinophils Relative: 1 %
HCT: 23.8 % — ABNORMAL LOW (ref 39.0–52.0)
Hemoglobin: 7.5 g/dL — ABNORMAL LOW (ref 13.0–17.0)
Immature Granulocytes: 9 %
Lymphocytes Relative: 13 %
Lymphs Abs: 0.3 10*3/uL — ABNORMAL LOW (ref 0.7–4.0)
MCH: 30.2 pg (ref 26.0–34.0)
MCHC: 31.5 g/dL (ref 30.0–36.0)
MCV: 96 fL (ref 80.0–100.0)
Monocytes Absolute: 0.2 10*3/uL (ref 0.1–1.0)
Monocytes Relative: 9 %
Neutro Abs: 1.8 10*3/uL (ref 1.7–7.7)
Neutrophils Relative %: 68 %
Platelets: 36 10*3/uL — ABNORMAL LOW (ref 150–400)
RBC: 2.48 MIL/uL — ABNORMAL LOW (ref 4.22–5.81)
RDW: 18.2 % — ABNORMAL HIGH (ref 11.5–15.5)
WBC: 2.6 10*3/uL — ABNORMAL LOW (ref 4.0–10.5)
nRBC: 1.9 % — ABNORMAL HIGH (ref 0.0–0.2)

## 2020-05-07 LAB — COMPREHENSIVE METABOLIC PANEL
ALT: 14 U/L (ref 0–44)
AST: 19 U/L (ref 15–41)
Albumin: 2.5 g/dL — ABNORMAL LOW (ref 3.5–5.0)
Alkaline Phosphatase: 63 U/L (ref 38–126)
Anion gap: 9 (ref 5–15)
BUN: 62 mg/dL — ABNORMAL HIGH (ref 8–23)
CO2: 22 mmol/L (ref 22–32)
Calcium: 7.1 mg/dL — ABNORMAL LOW (ref 8.9–10.3)
Chloride: 117 mmol/L — ABNORMAL HIGH (ref 98–111)
Creatinine, Ser: 1.48 mg/dL — ABNORMAL HIGH (ref 0.61–1.24)
GFR, Estimated: 51 mL/min — ABNORMAL LOW (ref >60)
Glucose, Bld: 106 mg/dL — ABNORMAL HIGH (ref 70–99)
Potassium: 3 mmol/L — ABNORMAL LOW (ref 3.5–5.1)
Sodium: 148 mmol/L — ABNORMAL HIGH (ref 135–145)
Total Bilirubin: 0.5 mg/dL (ref 0.3–1.2)
Total Protein: 5.1 g/dL — ABNORMAL LOW (ref 6.5–8.1)

## 2020-05-07 LAB — PREPARE RBC (CROSSMATCH)

## 2020-05-07 LAB — MAGNESIUM: Magnesium: 2.5 mg/dL — ABNORMAL HIGH (ref 1.7–2.4)

## 2020-05-07 LAB — URIC ACID: Uric Acid, Serum: 1.6 mg/dL — ABNORMAL LOW (ref 3.7–8.6)

## 2020-05-07 MED ORDER — HEPARIN (PORCINE) 25000 UT/250ML-% IV SOLN
1100.0000 [IU]/h | INTRAVENOUS | Status: DC
  Administered 2020-05-07 – 2020-05-10 (×3): 1100 [IU]/h via INTRAVENOUS
  Filled 2020-05-07 (×6): qty 250

## 2020-05-07 MED ORDER — RESOURCE THICKENUP CLEAR PO POWD
ORAL | Status: DC | PRN
  Filled 2020-05-07: qty 125

## 2020-05-07 MED ORDER — FUROSEMIDE 10 MG/ML IJ SOLN
20.0000 mg | Freq: Once | INTRAMUSCULAR | Status: AC
  Administered 2020-05-07: 20 mg via INTRAVENOUS
  Filled 2020-05-07: qty 2

## 2020-05-07 MED ORDER — FOOD THICKENER (SIMPLYTHICK): 2.0000 | Freq: Three times a day (TID) | ORAL | Status: DC

## 2020-05-07 MED ORDER — ADULT MULTIVITAMIN W/MINERALS CH
1.0000 | ORAL_TABLET | Freq: Every day | ORAL | Status: DC
  Administered 2020-05-07 – 2020-05-11 (×5): 1 via ORAL
  Filled 2020-05-07 (×5): qty 1

## 2020-05-07 MED ORDER — FUROSEMIDE 10 MG/ML IJ SOLN
INTRAMUSCULAR | Status: AC
  Filled 2020-05-07: qty 2

## 2020-05-07 MED ORDER — POTASSIUM CHLORIDE 20 MEQ PO PACK
40.0000 meq | PACK | ORAL | Status: AC
  Administered 2020-05-07 (×2): 40 meq via ORAL
  Filled 2020-05-07 (×2): qty 2

## 2020-05-07 MED ORDER — DEXTROSE 5 % IV SOLN: INTRAVENOUS | Status: AC

## 2020-05-07 MED ORDER — LOPERAMIDE HCL 2 MG PO CAPS
2.0000 mg | ORAL_CAPSULE | Freq: Once | ORAL | Status: AC
  Administered 2020-05-07: 2 mg via ORAL
  Filled 2020-05-07: qty 1

## 2020-05-07 MED ORDER — ENSURE ENLIVE PO LIQD
237.0000 mL | Freq: Three times a day (TID) | ORAL | Status: DC
  Administered 2020-05-08 – 2020-05-11 (×6): 237 mL via ORAL

## 2020-05-07 MED ORDER — ACETAMINOPHEN 325 MG PO TABS
650.0000 mg | ORAL_TABLET | Freq: Once | ORAL | Status: AC
  Administered 2020-05-07: 650 mg via ORAL
  Filled 2020-05-07: qty 2

## 2020-05-07 MED ORDER — SODIUM CHLORIDE 0.9% IV SOLUTION: Freq: Once | INTRAVENOUS | Status: DC

## 2020-05-07 MED ORDER — DIPHENHYDRAMINE HCL 25 MG PO CAPS
25.0000 mg | ORAL_CAPSULE | Freq: Once | ORAL | Status: AC
  Administered 2020-05-07: 25 mg via ORAL
  Filled 2020-05-07: qty 1

## 2020-05-07 NOTE — Progress Notes (Signed)
PROGRESS NOTE    Dean Dixon  XVQ:[id number] DOB: [birth year] DOA: 05/16/2020 PCP: Leonides Sake, MD   Chief Complaint  Patient presents with  . Groin Swelling  . Shortness of Breath  . Emesis    Brief Narrative:  69 year old male with stage IV mantle cell lymphoma on CHOP presenting with N/V and progressive SOB over the last 2 weeks found to have acute left segmental PE.  Admitted to [facility] however developed hypertension 250/100 with respiratory distress with chest pain, unable to tolerate BiPAP and was intubated.      History of present illness   HPI obtained from medical chart review as patient is currently intubated and sedated on mechanical ventilation.  69 year old male with prior medical history of stage IV mantle cell lymphoma (diagnosed 06/20/2019) on CHOP, hypertension, diabetes type 2 who presented to [facility] on 11/7 with progressive shortness of breath over the last 2 weeks, worse over the last 2 days with above baseline abdominal distention, non bloody nausea/ vomiting, nonradiating intermittent midsternal chest tightness, and cough with lightly productive greenish nonbloody sputum.  Additionally reported ongoing swelling in his legs and testicles causing difficulty with urination.  Recent hospitalization 10/28 - 11/1 found to have new left upper quadrant abdominal mass and hypercalcemia.    In ER, he was afebrile, hypertensive, mildly tachypneic, and tachycardic with oxygen saturations of 98% on room air.  Noted to have multiple episodes of vomiting and retching treated with zofran.  KUB with non-obstructive gas pattern.   Labs noted for glucose 290 with HgbA1c 6.3, BUN 40, AG 17, lactic acid 3.2-> 2.3, troponin hs 52, BNP 133, D-dimer 2.47, Hgb 11.5/ HCT 35.5, platelets 100, neg COVID/ flu, UA noted turbid urine with 5 ketones, negative for leukocytes/ nitrates.  CXR showed small bilateral pleural effusion with atelectasis and low lung volumes.  He was pretreated given  his contrast allergy. CTA PE and CT abd/ pelvis showed segmental nonocclusive left pulmonary embolism without associated hemorrhage or pulmonary infarct.  Additionally noted bulky soft tissue mass in LUQ with extensive lymphadenopathy in the chest and abd with evidence of widespread intraperitoneal metastatic disease, small bilateral pleural effusion, and  tracheobronchomalacia. Started on heparin gtt per pharmacy.   He was admitted by [facility] to progressive care.  In unit, he was noted to become hypertensive with BP 250/100 with complaints of chest discomfort with noted tachypnea and lethargy.  He was treated with hydralazine and placed on nitro gtt for blood pressure management.  Repeated EKG was non acute.  ABG on NRB noted 7.291/ 56.7/ 275.  Bipap was attempted however patient kept pulling it off and therefore given concern for impending respiratory failure, he was intubated by anesthesia.  PCCM consulted for further management    Assessment & Plan:   Active Problems:   Mantle cell lymphoma (HCC)   Acute on chronic respiratory failure with hypoxia (HCC)   Pulmonary embolism (HCC)   Shortness of breath   Dyspnea and respiratory abnormalities   Intubation of airway performed without difficulty   Pressure injury of skin   Abdominal distension   Tumor lysis syndrome   Antineoplastic chemotherapy induced pancytopenia (HCC)   Tumor lysis syndrome following antineoplastic drug therapy   Neutropenia with fever (HCC)   Acute renal failure (HCC)   Hypernatremia   Dyspnea   Melanotic stools  1 acute hypoxemic respiratory failure/metastatic mantle cell lymphoma on chemotherapy/segmental left-sided PE Status post intubation and extubation.  Patient with noted segmental left-sided PE.  Was on empiric IV heparin for PE.  Patient overnight yesterday with some complaints of shortness of breath placed on the BiPAP with some clinical improvement.  Lungs are clear to auscultation today.  Currently off BiPAP  and on room air with sats of 98 to 99%.  Status post 1 dose IV Lasix.  Patient with some lower extremity edema however likely 3rd spacing from low albumin levels.  IV heparin held due to concerns for ongoing GI bleed.  Supportive care.  2.  Acute renal failure Likely secondary to prerenal azotemia.  Urine output recorded was 600 cc over the past 24 hours.  Creatinine currently at 1.48.  Gentle hydration with D5W due to hypernatremia.  Supportive care.  Follow.   3.  Hypernatremia Placed on gentle D5W at 50 cc/h.  LR and normal saline at been discontinued.  Follow.    4.  Acute left segmental PE In the setting of metastatic mantle cell lymphoma.  Lower extremity Dopplers negative for DVT.  2D echo with no right ventricular strain.  Was on IV heparin however concern for ongoing GI bleed/melanotic stools and as such heparin discontinued.  GI to evaluate and will determine if patient can be resumed on anticoagulation at this time.  Hematology following.  Supportive care.  5.  Fever/leukopenia  Questionable etiology.  Chest x-ray negative for any acute infiltrates.  CT chest negative for infectious etiology.  Urinalysis unremarkable.  Blood cultures with no growth to date x5 days.  MRSA PCR negative.  COVID-19 PCR negative.  Influenza A and B negative.  Currently afebrile.  Status post 7 days IV cefepime.    6.  Pancytopenia/ Likely chemotherapy induced.  Per oncology patient received G-CSF already.  WBC at 2.6.  Hemoglobin 7.5.  Platelet count at 36,000.  Transfusion threshold hemoglobin < 8 per oncology recommendations and platelet count < 30,000.  Will transfuse 2 units packed red blood cells.  Follow H&H.  Per oncology.  7.  Concern for melanotic stools Patient with a pancytopenia.  Felt likely initially chemo induced.  Concern for GI bleed.  Patient with mantle cell lymphoma with large soft tissue mass in the left upper quadrant of abdomen which is intimately associated with multiple adjacent bowel  loops concerned this may be leading to probable GI bleed.  Patient also with extensive omental caking.  Patient with concerns for melanotic stools noted in rectal tube.  Patient was on IV heparin for PE which has been held overnight pending GI evaluation.  Patient received Protonix 80 mg IV push 05/06/2020 and currently on Protonix 40 mg IV every 12 hours.  We will transfuse 2 units packed red blood cells.  Keep patient n.p.o. until seen by GI.  GI consulted for further evaluation.  8.  Tumor lysis syndrome Status post rasburicase 05/01/2020 and 05/03/2020 for hyperuricemia.  Uric acid level at 1.6.  Continue allopurinol.     9.  Metastatic stage IV mantle cell lymphoma Per oncology who are following for consideration of oral chemotherapy agents for lymphoma.  Per oncology.  10.  Hypokalemia K. Dur 40 mEq p.o. every 4 hours x2 doses.  Magnesium at 2.5.  Follow.  11.  Prognosis Patient with metastatic mantle cell lymphoma with large soft tissue mass in the left upper quadrant of the abdomen associated with multiple bowel loops.  Patient now with concerns for GI bleed/melanotic stools.  Patient with extensive omental caking.  Patient mantle cell lymphoma resistant to chemotherapy.  Patient with tumor lysis syndrome.  Patient now with PE.  Patient with concerns for GI bleed and as such anticoagulation currently on hold.  GI consultation pending.  Patient with aspiration and was on a dysphagia 1 diet.  Patient with a poor prognosis.  Patient being followed by palliative care.  Oncology following.    DVT prophylaxis: Heparin Code Status: Full Family Communication: Updated patient.  No family at bedside. Disposition:   Status is: Inpatient    Dispo: The patient is from: Home              Anticipated d/c is to: To be determined              Anticipated d/c date is: To be determined.              Patient currently with concerns for melanotic stools/bleeding.  IV heparin held overnight.  Awaiting  GI evaluation.  Patient with PE.  Patient with stage IV mantle cell lymphoma being followed by oncology.  Not stable for discharge.    Significant Hospital Events   11/07 Admitted to [facility] decompensated/ intubated 11/08 Weaned on PSV, lasix with 2.2L UOP  11/09 Extubated  11/11 Fever / leukocytosis improved, on abx  11/12 Mental status improved, on RA, palliative radiation      Consultants:   PCCM 11/7  Procedures: Significant Diagnostic Tests:  11/7 KUB >> nonobstructive gas pattern 11/7 CTA PE/  CT abd/ pelvis >> Study is positive for segmental sized pulmonary emboli in the left lung. These appear nonocclusive at this time, and there is no associated pulmonary hemorrhage to suggest frank pulmonary infarct. Bulky soft tissue mass in the left upper quadrant with extensive lymphadenopathy in the chest and abdomen, and evidence of widespread intraperitoneal metastatic disease. Small bilateral pleural effusions lying dependently. Aortic atherosclerosis.  Tracheobronchomalacia. Cholelithiasis. Colonic diverticulosis. 11/8 [place] Ascites/ABD >> small volume ascites, insufficient for paracentesis, extensive omental caking and peritoneal nodularity consistent with peritoneal carcinomatosis  11/8 ECHO >> cystic structure noted in liver on subcostal images, LVEF 60-65%, no RWMA, RV systolic function normal, pericardial effusion is posterior to LV 11/8 LE Doppler >> negative for DVT  Procedures:  ETT 11/7 >> 11/9 TLC R IJ 11/8 >> 11/12    Antimicrobials:   Previously on azithro/ ceftriaxone, stopped 10/30 Cefepime 11/8 >>    Micro Data:  SARS 2/ flu 11/7 >> neg MRSA 11/7 >> neg UA 11/7 >> negative, 5 ketones BCx2 11/9 >>      Subjective: Laying in bed.  States he is feeling better than he did last night.  States shortness of breath has improved.  Was placed on BiPAP overnight.  Denies any chest pain.  Denies any significant shortness of breath.  Objective: Vitals:   05/07/20 0400  05/07/20 0500 05/07/20 0600 05/07/20 0800  BP: (!) 133/106  (!) 132/58 (!) 131/59  Pulse: 87 86 86 87  Resp: (!) 24 19 (!) 22 20  Temp:    97.6 F (36.4 C)  TempSrc:    Oral  SpO2: 98% 98% 99% 99%  Weight:  94.1 kg    Height:        Intake/Output Summary (Last 24 hours) at 05/07/2020 0915 Last data filed at 05/07/2020 0600 Gross per 24 hour  Intake 893.73 ml  Output 600 ml  Net 293.73 ml   Filed Weights   05/03/20 0700 05/06/20 1707 05/07/20 0500  Weight: 93.2 kg 96.5 kg 94.1 kg    Examination:  General exam: NAD. Off Bipap Respiratory system: Some  decreased breath sounds in the bases otherwise clear.  No wheezes, no crackles, no rhonchi.  Normal respiratory effort.  Cardiovascular system: Regular rate rhythm no murmurs rubs or gallops.  No JVD.  2+ bilateral lower extremity edema.  Gastrointestinal system: Abdomen is distended, soft, nontender to palpation, positive bowel sounds.  No rebound.  No guarding.  Central nervous system: Alert and oriented. No focal neurological deficits. Extremities: Symmetric 5 x 5 power.  2+ bilateral lower extremity edema. Skin: No rashes, lesions or ulcers GU: Rectal pouch with?  Melanotic stools Psychiatry: Judgement and insight appear normal. Mood & affect appropriate.     Data Reviewed: I have personally reviewed following labs and imaging studies  CBC: Recent Labs  Lab 05/03/20 0115 05/04/20 0530 05/05/20 0500 05/06/20 0500 05/07/20 0551  WBC 2.4* 3.9* 3.1* 2.9* 2.6*  NEUTROABS 1.1*  --   --   --  1.8  HGB 7.4* 9.4* 8.4* 7.9* 7.5*  HCT 22.6* 29.2* 26.8* 25.3* 23.8*  MCV 89.7 91.8 95.7 95.1 96.0  PLT 36* 39* 34* 34* 36*    Basic Metabolic Panel: Recent Labs  Lab 05/04/20 0530 05/05/20 0500 05/05/20 1615 05/06/20 0500 05/07/20 0551  NA 157* 158* 154* 148* 148*  K 3.7 3.5 4.0 3.2* 3.0*  CL 119* 124* 119* 116* 117*  CO2 23 24 23 22 22   GLUCOSE 138* 155* 270* 118* 106*  BUN 88* 81* 75* 71* 62*  CREATININE 1.97*  1.67* 1.60* 1.44* 1.48*  CALCIUM 7.2* 7.3* 7.1* 7.0* 7.1*  MG  --   --   --   --  2.5*    GFR: Estimated Creatinine Clearance: 48.1 mL/min (A) (by C-G formula based on SCr of 1.48 mg/dL (H)).  Liver Function Tests: Recent Labs  Lab 05/03/20 1131 05/06/20 0500 05/07/20 0551  AST 35 20 19  ALT 20 15 14   ALKPHOS 57 65 63  BILITOT 0.8 0.5 0.5  PROT 5.7* 5.2* 5.1*  ALBUMIN 2.8* 2.7* 2.5*    CBG: Recent Labs  Lab 05/06/20 1555 05/06/20 2056 05/06/20 2346 05/07/20 0421 05/07/20 0741  GLUCAP 168* 132* 126* 96 101*     Recent Results (from the past 240 hour(s))  Respiratory Panel by RT PCR (Flu A&B, Covid) - Nasopharyngeal Swab     Status: None   Collection Time: 05/04/2020  1:14 PM   Specimen: Nasopharyngeal Swab  Result Value Ref Range Status   SARS Coronavirus 2 by RT PCR NEGATIVE NEGATIVE Final    Comment: (NOTE) SARS-CoV-2 target nucleic acids are NOT DETECTED.  The SARS-CoV-2 RNA is generally detectable in upper respiratoy specimens during the acute phase of infection. The lowest concentration of SARS-CoV-2 viral copies this assay can detect is 131 copies/mL. A negative result does not preclude SARS-Cov-2 infection and should not be used as the sole basis for treatment or other patient management decisions. A negative result may occur with  improper specimen collection/handling, submission of specimen other than nasopharyngeal swab, presence of viral mutation(s) within the areas targeted by this assay, and inadequate number of viral copies (<131 copies/mL). A negative result must be combined with clinical observations, patient history, and epidemiological information. The expected result is Negative.  Fact Sheet for Patients:  PinkCheek.be  Fact Sheet for Healthcare Providers:  GravelBags.it  This test is no t yet approved or cleared by the [place] FDA and  has been authorized for detection and/or  diagnosis of SARS-CoV-2 by FDA under an Emergency Use Authorization (EUA). This EUA will remain  in effect (meaning this test can be used) for the duration of the COVID-19 declaration under Section 564(b)(1) of the Act, 21 U.S.C. section 360bbb-3(b)(1), unless the authorization is terminated or revoked sooner.     Influenza A by PCR NEGATIVE NEGATIVE Final   Influenza B by PCR NEGATIVE NEGATIVE Final    Comment: (NOTE) The Xpert Xpress SARS-CoV-2/FLU/RSV assay is intended as an aid in  the diagnosis of influenza from Nasopharyngeal swab specimens and  should not be used as a sole basis for treatment. Nasal washings and  aspirates are unacceptable for Xpert Xpress SARS-CoV-2/FLU/RSV  testing.  Fact Sheet for Patients: PinkCheek.be  Fact Sheet for Healthcare Providers: GravelBags.it  This test is not yet approved or cleared by the [place] FDA and  has been authorized for detection and/or diagnosis of SARS-CoV-2 by  FDA under an Emergency Use Authorization (EUA). This EUA will remain  in effect (meaning this test can be used) for the duration of the  Covid-19 declaration under Section 564(b)(1) of the Act, 21  U.S.C. section 360bbb-3(b)(1), unless the authorization is  terminated or revoked. Performed at [facility], [place] [address] [place], [place] [address]   MRSA PCR Screening     Status: None   Collection Time: 05/01/2020  5:25 PM   Specimen: Nasopharyngeal  Result Value Ref Range Status   MRSA by PCR NEGATIVE NEGATIVE Final    Comment:        The GeneXpert MRSA Assay (FDA approved for NASAL specimens only), is one component of a comprehensive MRSA colonization surveillance program. It is not intended to diagnose MRSA infection nor to guide or monitor treatment for MRSA infections. Performed at [facility], [place] [address] [place], [place] [address]   Culture, blood  (Routine X 2) w Reflex to ID Panel     Status: None   Collection Time: 05/01/20  8:05 AM   Specimen: BLOOD LEFT HAND  Result Value Ref Range Status   Specimen Description   Final    BLOOD LEFT HAND Performed at [facility] [address], [place], [place] [address]    Special Requests   Final    BOTTLES DRAWN AEROBIC ONLY Blood Culture adequate volume Performed at [facility] [address] [place], [place] [address]    Culture   Final    NO GROWTH 5 DAYS Performed at [facility], [place] [address] [place], [place] [address]    Report Status 05/06/2020 FINAL  Final  Culture, blood (Routine X 2) w Reflex to ID Panel     Status: None   Collection Time: 05/01/20  8:11 AM   Specimen: BLOOD LEFT HAND  Result Value Ref Range Status   Specimen Description   Final    BLOOD LEFT HAND Performed at [facility] [address], [place], [place] [address]    Special Requests   Final    BOTTLES DRAWN AEROBIC ONLY Blood Culture results may not be optimal due to an inadequate volume of blood received in culture bottles Performed at [facility] [address], [place], [place] [address]    Culture   Final    NO GROWTH 5 DAYS Performed at [facility], [place] [address] [place],  [address]    Report Status 05/06/2020 FINAL  Final  Culture, Urine     Status: None   Collection Time: 05/05/20  7:48 AM   Specimen: Urine,  Random  Result Value Ref Range Status   Specimen Description   Final    URINE, RANDOM Performed at [facility] [address] [place], [place] [address]    Special Requests   Final    URINE, RANDOM Performed at [facility] [address], [place], [place] [address]    Culture   Final    NO GROWTH Performed at [facility], [place] [address], [place], [place] [address]    Report Status 05/06/2020 FINAL  Final          Radiology Studies: DG CHEST PORT 1 VIEW  Result Date: 05/06/2020 CLINICAL DATA:  Increased shortness of breath, hypertension EXAM: PORTABLE CHEST 1 VIEW COMPARISON:  04/30/2020 FINDINGS: Single frontal view of the chest demonstrates interval extubation and removal of the enteric catheter. Right internal jugular catheter tip projects over superior vena cava. The cardiac silhouette is stable. Lung volumes are diminished, without airspace disease, effusion, or pneumothorax. IMPRESSION: 1. Low lung volumes.  No acute process. Electronically Signed   By: Randa Ngo M.D.   On: 05/06/2020 18:51        Scheduled Meds: . sodium chloride   Intravenous Once  . sodium chloride   Intravenous Once  . allopurinol  100 mg Oral BID  . amLODipine  10 mg Per Tube Daily  . bisacodyl  10 mg Rectal Once  . Chlorhexidine Gluconate Cloth  6 each Topical Daily  . collagenase   Topical Daily  . insulin aspart  0-15 Units Subcutaneous Q4H  . mouth rinse  15 mL Mouth Rinse BID  . pantoprazole (PROTONIX) IV  40 mg Intravenous Q12H  . potassium chloride  40 mEq Oral Q4H  . sodium chloride flush  10-40 mL Intracatheter Q12H   Continuous Infusions: . dextrose 50 mL/hr at 05/07/20 0842     LOS: 8 days    Time spent: 40 minutes    Irine Seal, MD [facility]   To contact the attending provider between 7A-7P or the covering provider during after hours 7P-7A, please log into the web site [email or web address] and access using universal [place] password for that web site. If you do not have the password, please call the hospital operator.  05/07/2020, 9:15 AM

## 2020-05-07 NOTE — Progress Notes (Signed)
ANTICOAGULATION CONSULT NOTE - Follow Up Consult  Pharmacy Consult for Heparin Indication: pulmonary embolus  Allergies  Allergen Reactions  . Ivp Dye [Iodinated Diagnostic Agents] Shortness Of Breath    "allergic to CT scan dye", took benadryl with last scan and tolerated well    Patient Measurements: Height: 5\' 4"  (162.6 cm) Weight: 94.1 kg (207 lb 7.3 oz) IBW/kg (Calculated) : 59.2 Heparin Dosing Weight: 81 kg  Vital Signs: Temp: 98.2 F (36.8 C) (11/15 1222) Temp Source: Oral (11/15 1222) BP: 110/50 (11/15 1000) Pulse Rate: 88 (11/15 1000)  Labs: Recent Labs    05/05/20 0500 05/05/20 0500 05/05/20 1615 05/06/20 0500 05/06/20 0600 05/07/20 0551  HGB 8.4*   < >  --  7.9*  --  7.5*  HCT 26.8*  --   --  25.3*  --  23.8*  PLT 34*  --   --  34*  --  36*  HEPARINUNFRC 0.78*  --  0.58  --  0.51  --   CREATININE 1.67*   < > 1.60* 1.44*  --  1.48*   < > = values in this interval not displayed.    Estimated Creatinine Clearance: 48.1 mL/min (A) (by C-G formula based on SCr of 1.48 mg/dL (H)).   Medications:  Infusions:  . dextrose 50 mL/hr at 05/07/20 8889  . heparin      Assessment: Patient is a 69 y.o M with stage IV mantle cell lymphoma on chemotherapy (got CHOP on 04/21/20), presented to the ED on 11/7 with c/o SOB, groin swelling and emesis.  Chest CT showed acute PE.  Pharmacy is consulted to start heparin drip for VTE.  Today, 05/07/2020:  Heparin stopped 11/14 PM for melenic stool, FOB +  GI consulted and found stool bag today not melenic and is ok with resuming IV UFH  Transfusing 2 units PRBC this afternoon that will take about 6 hours  No other bleeding noted  Goal of Therapy:  Heparin level 0.3-0.7 units/ml Monitor platelets by anticoagulation protocol: Yes   Plan:   Resume heparin IV infusion at 1100 units/hr  Check level 4 hours after transfusion completed   Daily heparin level and CBC  Continue to monitor s/s bleeding.  Ulice Dash, PharmD, [place] [phone] 05/07/2020 12:34 PM

## 2020-05-07 NOTE — Consult Note (Addendum)
Referring Provider: Dr. Irine Seal  Primary Care Physician:  Leonides Sake, MD Primary Gastroenterologist:  Althia Forts   Reason for Consultation: Melena  HPI: Dean Dixon is a 69 y.o. male with a past medical history of hypertension, diabetes mellitus type 2 and mantle cell lymphoma diagnosed 06/20/2019. He was admitted to the hospital 10/28-11/01 due to having a new left upper quadrant abdominal mass and hypercalcemia.  CTAP showed a  large 15 cm lymphoid mass within the left upper quadrant new from recent PET scan with retroperitoneal lymphadenopathy and precordial adenopathy. Omental thickening and pelvic fluid. He received 1 cycle of CHOP on 10/30.  He was discharged home on 04/23/2020 with continued follow-up with oncology as an outpatient. Plans for palliative radiation. He he was admitted to the hospital 05/20/2020 with nausea and vomiting and progressive shortness of breath for the past 2 weeks.  A chest CT angiogram was positive for acute left segmental PE.  He required intubation due to respiratory distress and he was  admitted to the ICU.  A heparin drip was started due to his PE. His respiratory status improved and he was extubated.  He was noted to have melenic stools on 05/06/2020.  A GI consult was requested for further evaluation.  CBC Latest Ref Rng & Units 05/07/2020 05/06/2020 05/05/2020  WBC 4.0 - 10.5 K/uL 2.6(L) 2.9(L) 3.1(L)  Hemoglobin 13.0 - 17.0 g/dL 7.5(L) 7.9(L) 8.4(L)  Hematocrit 39 - 52 % 23.8(L) 25.3(L) 26.8(L)  Platelets 150 - 400 K/uL 36(L) 34(L) 34(L)   Past Medical History:  Diagnosis Date  . Cancer ([place])    Mantle Cell Lymphoma  . Diabetes mellitus without complication ([place])   . Hypertension     Past Surgical History:  Procedure Laterality Date  . APPENDECTOMY    . TONSILLECTOMY      Prior to Admission medications   Medication Sig Start Date End Date Taking? Authorizing Provider  allopurinol (ZYLOPRIM) 300 MG tablet Take 1 tablet (300 mg  total) by mouth daily. 04/24/20 05/24/20 Yes Ghimire, Dante Gang, MD  amLODipine (NORVASC) 10 MG tablet Take 10 mg by mouth daily.   Yes [provider]  insulin detemir (LEVEMIR) 100 UNIT/ML FlexPen Inject 10 Units into the skin at bedtime for 5 days. Patient taking differently: Inject 11 Units into the skin at bedtime.  11/17/19 04/26/2020 Yes Mariel Aloe, MD  ramipril (ALTACE) 10 MG capsule Take 10 mg by mouth daily.   Yes [provider]  blood glucose meter kit and supplies Check blood sugar 4 times daily (Before breakfast, lunch, dinner, bedtime). (FOR ICD-10 E10.9, E11.9). 11/17/19   Mariel Aloe, MD  Insulin Pen Needle 31G X 5 MM MISC BD Pen Needles- brand specific Inject insulin via insulin pen 6 x daily 11/17/19   Mariel Aloe, MD  prochlorperazine (COMPAZINE) 10 MG tablet Take 1 tablet (10 mg total) by mouth every 6 (six) hours as needed (Nausea or vomiting). Patient not taking: Reported on 04/19/2020 06/20/19 04/19/20  Brunetta Genera, MD    Current Facility-Administered Medications  Medication Dose Route Frequency Provider Last Rate Last Admin  . 0.9 %  sodium chloride infusion (Manually program via Guardrails IV Fluids)   Intravenous Once Ogan, [place]wo U, MD      . 0.9 %  sodium chloride infusion (Manually program via Guardrails IV Fluids)   Intravenous Once Mikey Bussing R, NP      . acetaminophen (TYLENOL) tablet 650 mg  650 mg Per Tube Q6H PRN  Efraim Kaufmann, RPH   650 mg at 05/01/20 2353   Or  . acetaminophen (TYLENOL) suppository 650 mg  650 mg Rectal Q6H PRN Efraim Kaufmann, RPH   650 mg at 05/01/20 2138  . allopurinol (ZYLOPRIM) tablet 100 mg  100 mg Oral BID Brunetta Genera, MD   100 mg at 05/06/20 2132  . amLODipine (NORVASC) tablet 10 mg  10 mg Per Tube Daily Ollis, Brandi L, NP   10 mg at 05/06/20 1015  . bisacodyl (DULCOLAX) suppository 10 mg  10 mg Rectal Once Eugenie Filler, MD      . Chlorhexidine Gluconate Cloth 2 % PADS 6  each  6 each Topical Daily Kyle, Tyrone A, DO   6 each at 05/06/20 1129  . collagenase (SANTYL) ointment   Topical Daily Loistine Chance, MD   Given at 05/06/20 1129  . dextrose 5 % solution   Intravenous Continuous Eugenie Filler, MD 50 mL/hr at 05/07/20 0842 New Bag at 05/07/20 0842  . fentaNYL (SUBLIMAZE) injection 25-100 mcg  25-100 mcg Intravenous Q2H PRN Frederik Pear, MD   100 mcg at 04/30/20 0524  . Gerhardt's butt cream   Topical PRN Olalere, Adewale A, MD      . guaiFENesin (ROBITUSSIN) 100 MG/5ML solution 200 mg  10 mL Per Tube Q4H PRN Efraim Kaufmann, RPH      . insulin aspart (novoLOG) injection 0-15 Units  0-15 Units Subcutaneous Q4H Ollis, Brandi L, NP   2 Units at 05/07/20 0035  . MEDLINE mouth rinse  15 mL Mouth Rinse BID Eugenie Filler, MD   15 mL at 05/06/20 2048  . pantoprazole (PROTONIX) injection 40 mg  40 mg Intravenous Q12H Eugenie Filler, MD      . potassium chloride (KLOR-CON) packet 40 mEq  40 mEq Oral Q4H Eugenie Filler, MD   40 mEq at 05/07/20 0831  . sodium chloride flush (NS) 0.9 % injection 10-40 mL  10-40 mL Intracatheter Q12H Olalere, Adewale A, MD   10 mL at 05/06/20 2048  . sodium chloride flush (NS) 0.9 % injection 10-40 mL  10-40 mL Intracatheter PRN Olalere, Adewale A, MD        Allergies as of 05/13/2020 - Review Complete 05/13/2020  Allergen Reaction Noted  . Ivp dye [iodinated diagnostic agents] Shortness Of Breath 06/10/2019    No family history on file.  Social History   Socioeconomic History  . Marital status: Divorced    Spouse name: Not on file  . Number of children: Not on file  . Years of education: Not on file  . Highest education level: Not on file  Occupational History  . Not on file  Tobacco Use  . Smoking status: Never Smoker  . Smokeless tobacco: Never Used  Substance and Sexual Activity  . Alcohol use: Yes    Alcohol/week: 2.0 standard drinks    Types: 2 Standard drinks or equivalent per week  . Drug use:  No  . Sexual activity: Not on file  Other Topics Concern  . Not on file  Social History Narrative  . Not on file   Social Determinants of Health   Financial Resource Strain:   . Difficulty of Paying Living Expenses: Not on file  Food Insecurity:   . Worried About Charity fundraiser in the Last Year: Not on file  . Ran Out of Food in the Last Year: Not on file  Transportation Needs:   .  Lack of Transportation (Medical): Not on file  . Lack of Transportation (Non-Medical): Not on file  Physical Activity:   . Days of Exercise per Week: Not on file  . Minutes of Exercise per Session: Not on file  Stress:   . Feeling of Stress : Not on file  Social Connections:   . Frequency of Communication with Friends and Family: Not on file  . Frequency of Social Gatherings with Friends and Family: Not on file  . Attends Religious Services: Not on file  . Active Member of Clubs or Organizations: Not on file  . Attends Archivist Meetings: Not on file  . Marital Status: Not on file  Intimate Partner Violence:   . Fear of Current or Ex-Partner: Not on file  . Emotionally Abused: Not on file  . Physically Abused: Not on file  . Sexually Abused: Not on file    Review of Systems: See HPI   Physical Exam: Vital signs in last 24 hours: Temp:  [97.6 F (36.4 C)-98.7 F (37.1 C)] 97.6 F (36.4 C) (11/15 0800) Pulse Rate:  [86-104] 87 (11/15 0800) Resp:  [19-28] 20 (11/15 0800) BP: (104-143)/(45-106) 131/59 (11/15 0800) SpO2:  [96 %-100 %] 99 % (11/15 0800) Weight:  [94.1 kg-96.5 kg] 94.1 kg (11/15 0500) Last BM Date: 05/06/20 General: Fatigued appearing 69 year old male in no acute distress.( Attending addendum - this patient is ill-appearing, weak and mildly dyspneic at rest) Head:  Normocephalic and atraumatic. Eyes:  No scleral icterus. Conjunctiva pink. Ears:  Normal auditory acuity. Nose:  No deformity, discharge or lesions. Mouth:  No ulcers or lesions.  Neck:  Supple.  No lymphadenopathy or thyromegaly.  Lungs: Scattered crackles, diminished in the left lower base.  (attending addendum: significantly decreased breath sounds right base and dull) Heart: Regular rate and rhythm, no murmurs. Abdomen: Abdomen with gaseous distention, nontender.  Positive bowel sounds all 4 quadrants. (attending addendum: evaluation for mass limited by body habitus, but there is probable fullness in the LUQ) Rectal: Deferred. Musculoskeletal:  Symmetrical without gross deformities.  Pulses:  Normal pulses noted. Extremities: Upper extremities with 1+ edema, lower extremities with 2-3+ pitting edema. Neurologic:  Alert and  oriented x4. No focal deficits.  Skin:  Intact without significant lesions or rashes. Psych:  Alert and cooperative. Normal mood and affect. (attending addendum:  Fecal collection bag with dark brown/green stool - not melena.  Confirmed with Dr. Grandville Silos this was same appearance yesterday)  Intake/Output from previous day: 11/14 0701 - 11/15 0700 In: 1038.7 [I.V.:938.7; IV Piggyback:100] Out: 600 [Urine:600] Intake/Output this shift: No intake/output data recorded.  Lab Results: Recent Labs    05/05/20 0500 05/06/20 0500 05/07/20 0551  WBC 3.1* 2.9* 2.6*  HGB 8.4* 7.9* 7.5*  HCT 26.8* 25.3* 23.8*  PLT 34* 34* 36*   BMET Recent Labs    05/05/20 1615 05/06/20 0500 05/07/20 0551  NA 154* 148* 148*  K 4.0 3.2* 3.0*  CL 119* 116* 117*  CO2 $Re'23 22 22  'Eor$ GLUCOSE 270* 118* 106*  BUN 75* 71* 62*  CREATININE 1.60* 1.44* 1.48*  CALCIUM 7.1* 7.0* 7.1*   LFT Recent Labs    05/07/20 0551  PROT 5.1*  ALBUMIN 2.5*  AST 19  ALT 14  ALKPHOS 63  BILITOT 0.5   PT/INR No results for input(s): LABPROT, INR in the last 72 hours. Hepatitis Panel No results for input(s): HEPBSAG, HCVAB, HEPAIGM, HEPBIGM in the last 72 hours.    Studies/Results: DG CHEST PORT 1  VIEW  Result Date: 05/06/2020 CLINICAL DATA:  Increased shortness of breath,  hypertension EXAM: PORTABLE CHEST 1 VIEW COMPARISON:  04/30/2020 FINDINGS: Single frontal view of the chest demonstrates interval extubation and removal of the enteric catheter. Right internal jugular catheter tip projects over superior vena cava. The cardiac silhouette is stable. Lung volumes are diminished, without airspace disease, effusion, or pneumothorax. IMPRESSION: 1. Low lung volumes.  No acute process. Electronically Signed   By: Randa Ngo M.D.   On: 05/06/2020 18:51    IMPRESSION/PLAN:  38.  70 year old male with mantle cell lymphoma with a large soft tissue mass to the LUQ with widespread intraperitoneal metastatic disease with reports of melenic stool and positive FOBT. Hg 9.4 -> 8.4 -> 7.9 -> 7.5.  Heparin IV discontinued.  Fecal bag today contains loose dark brown stool, not black melenic stool. -No plans for endoscopic evaluation as the benefits do not outweigh the risks -Continue to monitor H&H closely -Transfuse for hemoglobin less than 7 -Resume heparin gtt -Dysphagia 3 diet -Call our service if he demonstrates evidence of melena  -Continue PPI IV   2.  Pulmonary embolism, left lung -Ok to restart heparin gtt  3.  Thrombocytopenia secondary to chemo.  Platelet count 36.  4.  Heme positive stool  5. Multifactorial anemia, largely from malignancy and CTX-induced marrow suppression.  Further recommendations per Dr. Joseph Pierini Dorathy Daft  05/07/2020, 9:15 AM  I have reviewed the entire case in detail with the above APP and discussed the plan in detail when we saw the patient together.  Therefore, I agree with the diagnoses recorded above. In addition,  I have personally interviewed and examined the patient and have personally reviewed any abdominal/pelvic CT scan images.  We discussed the case with Dr. Grandville Silos of [facility] after evaluation.  My additional thoughts are as follows:  This patient does not currently have overt GI bleeding.  Heme positive stool  noted, not surprising with the nature and degree of this patient's illness and anticoagulation.  The lymphoma mass in LUQ may be involving the GI tract lumen.  Given patient's advanced malignancy that is not responding to systemic therapy, severe thrombocytopenia, and tenuous respiratory status (also see pulmonary findings on CT chest), I fel that the risks of endoscopic procedures outweigh the expected benefits for this patient.  Resume heparin and diet (though his appetite is poor).  Primary team and oncology attempting to address goals of care since prognosis appears poor.  Signing off, call as needed.  Dr. Scarlette Shorts will be primary consult physician for our service the remainder of this week.    Nelida Meuse III Office:[phone]

## 2020-05-07 NOTE — Progress Notes (Signed)
Nutrition Follow-up  DOCUMENTATION CODES:   Obesity unspecified  INTERVENTION:  - continue Magic Cup TID with meals per current diet order policy, each supplement provides 290 kcal and 9 grams of protein. - will order Ensure Enlive TID, each supplement provides 350 kcal and 20 grams of protein. - add 2 packets Simply Thick to each Ensure to make it honey-thick. - will order 1 tablet multivitamin with minerals/day.   NUTRITION DIAGNOSIS:   Increased nutrient needs related to chronic illness, cancer and cancer related treatments, acute illness as evidenced by estimated needs. -revised, ongoing  GOAL:   Patient will meet greater than or equal to 90% of their needs -unmet  MONITOR:   PO intake, Supplement acceptance, Labs, Weight trends  ASSESSMENT:   Pt with stage IV mantle cell lymphoma on CHOP presented with N/V and dyspnea x2 weeks found to have acute left segmental PE. PMH also includes DM and HTN.  Patient was extubated on 11/9. Diet advanced to Dysphagia 1, honey-thick on 11/13 at 0753. Per flow sheet documentation, he consumed 75% of breakfast and 25% of lunch on 11/13. No intakes documented yesterday or from breakfast this AM.  Speech slightly slurred and difficult to understand at time. Patient laying in bed with eyes closed throughout visit and no family/visitors present.   He denies having anything for breakfast this AM. He denies abdominal pain/pressure or nausea. He denies any chewing or swallowing when he consumed items the past two days. Patient indicates that he did sleep well last night.   Weight has been fairly stable since 11/7.  Per notes: - metastatic stage 4 mantle cell lymphoma on chemo - ARF - mild hypernatremia with D5 ordered - fever/leukopenia with unknown origin; COVID and flu A + B negative - tumor lysis syndrome - poor prognosis with ongoing     Labs reviewed; CBGs: 96 and 101 mg/dl, Na: 148 mmol/l, K: 3 mmol/l, Cl: 117 mmol/l, BUN: 62 mg/dl,  creatinine: 1.48 mg/dl, Ca: 7.1 mg/dl, GFR: 51 ml/min.  Medications reviewed; 20 mg IV lasix x1 dose 11/14, x2 doses 11/15, sliding scale novolog, 40 mg IV protonix BID, 40 mEq Klor-Con x2 doses 11/15. IVF; D5 @ 50 ml/hr (204 kcal).    Diet Order:   Diet Order            DIET - DYS 1 Room service appropriate? Yes; Fluid consistency: Honey Thick  Diet effective now                 EDUCATION NEEDS:   No education needs have been identified at this time  Skin:  Skin Assessment: Skin Integrity Issues: Skin Integrity Issues:: Stage II, DTI DTI: coccyx Stage II: coccyx  Last BM:  11/14 (type 7 x2)  Height:   Ht Readings from Last 1 Encounters:  05/06/2020 5\' 4"  (1.626 m)    Weight:   Wt Readings from Last 1 Encounters:  05/07/20 94.1 kg     Estimated Nutritional Needs:  Kcal:  2100-2300 Protein:  105-115 grams Fluid:  >2L/d     Jarome Matin, MS, RD, LDN, CNSC Inpatient Clinical Dietitian RD pager # available in [facility]  After hours/weekend pager # available in [facility]

## 2020-05-07 NOTE — Progress Notes (Signed)
SLP Cancellation Note  Patient Details Name: Deforest Maiden MRN: [id number] DOB: 23-Sep-1950   Cancelled treatment:       Reason Eval/Treat Not Completed: Other (comment);Medical issues which prohibited therapy (pt npo, will continue efforts)  Kathleen Lime, MS [facility] SLP Acute Rehab Services Office [phone] Pager [phone]    Macario Golds 05/07/2020, 8:27 AM

## 2020-05-07 NOTE — Progress Notes (Addendum)
HEMATOLOGY-ONCOLOGY PROGRESS NOTE  SUBJECTIVE:  Events from the weekend have been noted. He was placed due to shortness of breath yesterday. He received IV Lasix. Noted to have heme positive stools last evening. Heparin stopped. GI consult pending. He is less responsive this morning. Opens eyes, answers some questions.  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 ([place])   06/29/2019 - 10/20/2019 Chemotherapy   The patient had dexamethasone (DECADRON) 4 MG tablet, 8 mg, Oral, Daily, 1 of 1 cycle, Start date: 06/20/2019, End date: 11/17/2019 palonosetron (ALOXI) injection 0.25 mg, 0.25 mg, Intravenous,  Once, 5 of 5 cycles Administration: 0.25 mg (07/27/2019), 0.25 mg (06/30/2019), 0.25 mg (08/24/2019), 0.25 mg (10/20/2019), 0.25 mg (09/21/2019) pegfilgrastim (NEULASTA ONPRO KIT) injection 6 mg, 6 mg, Subcutaneous, Once, 3 of 3 cycles Administration: 6 mg (08/25/2019), 6 mg (09/22/2019) bendamustine (BENDEKA) 200 mg in sodium chloride 0.9 % 50 mL (3.4483 mg/mL) chemo infusion, 90 mg/m2 = 200 mg, Intravenous,  Once, 5 of 5 cycles Administration: 200 mg (06/29/2019), 200 mg (06/30/2019), 200 mg (07/27/2019), 200 mg (07/28/2019), 200 mg (08/24/2019), 200 mg (08/25/2019), 200 mg (09/21/2019), 200 mg (09/22/2019) riTUXimab-pvvr (RUXIENCE) 800 mg in sodium chloride 0.9 % 250 mL (2.4242 mg/mL) infusion, 375 mg/m2 = 800 mg, Intravenous,  Once, 5 of 5 cycles Dose modification: 375 mg/m2 (original dose 375 mg/m2, Cycle 2), 400 mg (original dose 375 mg/m2, Cycle 2, Reason: Other (see comments), Comment: finishing rituximab from previous day, infusion not completed), 300 mg (original dose 300 mg, Cycle 4) Administration: 800 mg (06/29/2019), 800 mg (07/27/2019), 800 mg (08/24/2019), 400 mg (07/28/2019), 800 mg (10/20/2019), 800 mg (09/21/2019), 300 mg (09/22/2019)  for chemotherapy treatment.    04/20/2020 -  Chemotherapy   The patient had DOXOrubicin  (ADRIAMYCIN) chemo injection 100 mg, 48 mg/m2 = 104 mg, Intravenous,  Once, 1 of 8 cycles Administration: 100 mg (04/21/2020) palonosetron (ALOXI) injection 0.25 mg, 0.25 mg, Intravenous,  Once, 1 of 8 cycles Administration: 0.25 mg (04/21/2020) pegfilgrastim-cbqv (UDENYCA) injection 6 mg, 6 mg, Subcutaneous, Once, 1 of 1 cycle Administration: 6 mg (04/24/2020) vinCRIStine (ONCOVIN) 2 mg in sodium chloride 0.9 % 50 mL chemo infusion, 2 mg, Intravenous,  Once, 1 of 8 cycles Administration: 2 mg (04/21/2020) cyclophosphamide (CYTOXAN) 1,560 mg in sodium chloride 0.9 % 250 mL chemo infusion, 750 mg/m2 = 1,560 mg, Intravenous,  Once, 1 of 8 cycles Administration: 1,560 mg (04/21/2020) fosaprepitant (EMEND) 150 mg in sodium chloride 0.9 % 145 mL IVPB, 150 mg, Intravenous,  Once, 1 of 8 cycles Administration: 150 mg (04/21/2020)  for chemotherapy treatment.       REVIEW OF SYSTEMS:   Negative except as noted in the HPI.  PHYSICAL EXAMINATION: ECOG PERFORMANCE STATUS: 3 - Symptomatic, >50% confined to bed  Vitals:   05/07/20 0800 05/07/20 1000  BP: (!) 131/59 (!) 110/50  Pulse: 87 88  Resp: 20 (!) 22  Temp: 97.6 F (36.4 C)   SpO2: 99% 99%   Filed Weights   05/03/20 0700 05/06/20 1707 05/07/20 0500  Weight: 93.2 kg 96.5 kg 94.1 kg    Intake/Output from previous day: 11/14 0701 - 11/15 0700 In: 1038.7 [I.V.:938.7; IV Piggyback:100] Out: 600 [Urine:600]  GENERAL: Awake, less interactive SKIN: skin color, texture, turgor are normal, no rashes or significant lesions EYES: normal, Conjunctiva are pink and non-injected, sclera clear LUNGS: Diminished HEART: regular rate & rhythm and no murmurs, 2+ bilateral lower extremity edema ABDOMEN:  Significant abdominal distention NEURO: Alert and oriented x3.  LABORATORY DATA:  I have reviewed the data as listed CMP Latest Ref Rng & Units 05/07/2020 05/06/2020 05/05/2020  Glucose 70 - 99 mg/dL 106(H) 118(H) 270(H)  BUN 8 - 23 mg/dL  62(H) 71(H) 75(H)  Creatinine 0.61 - 1.24 mg/dL 1.48(H) 1.44(H) 1.60(H)  Sodium 135 - 145 mmol/L 148(H) 148(H) 154(H)  Potassium 3.5 - 5.1 mmol/L 3.0(L) 3.2(L) 4.0  Chloride 98 - 111 mmol/L 117(H) 116(H) 119(H)  CO2 22 - 32 mmol/L _0 Calcium 8.9 - 10.3 mg/dL 7.1(L) 7.0(L) 7.1(L)  Total Protein 6.5 - 8.1 g/dL 5.1(L) 5.2(L) -  Total Bilirubin 0.3 - 1.2 mg/dL 0.5 0.5 -  Alkaline Phos 38 - 126 U/L 63 65 -  AST 15 - 41 U/L 19 20 -  ALT 0 - 44 U/L 14 15 -   . CBC Latest Ref Rng & Units 05/07/2020 05/06/2020 05/05/2020  WBC 4.0 - 10.5 K/uL 2.6(L) 2.9(L) 3.1(L)  Hemoglobin 13.0 - 17.0 g/dL 7.5(L) 7.9(L) 8.4(L)  Hematocrit 39 - 52 % 23.8(L) 25.3(L) 26.8(L)  Platelets 150 - 400 K/uL 36(L) 34(L) 34(L)    Lab Results  Component Value Date   WBC 2.6 (L) 05/07/2020   HGB 7.5 (L) 05/07/2020   HCT 23.8 (L) 05/07/2020   MCV 96.0 05/07/2020   PLT 36 (L) 05/07/2020   NEUTROABS 1.8 05/07/2020    CT ABDOMEN PELVIS WO CONTRAST  Addendum Date: 04/19/2020   ADDENDUM REPORT: 04/19/2020 12:54 ADDENDUM: The peritoneal fluid within the pelvis is not enhancing as this is a non IV contrast imaging exam. Therefore would described fluid collection as having thickened rim. Favor peritoneal metastasis. Findings conveyed toSCOTT GOLDSTON on 04/19/2020  at12:54. Electronically Signed   By: Suzy Bouchard M.D.   On: 04/19/2020 12:54   Result Date: 04/19/2020 CLINICAL DATA:  Abdominal pain.  Tachycardia.  Mantle cell lymphoma EXAM: CT ABDOMEN AND PELVIS WITHOUT CONTRAST TECHNIQUE: Multidetector CT imaging of the abdomen and pelvis was performed following the standard protocol without IV contrast. COMPARISON:  PET-CT scan 01/02/2020 FINDINGS: Lower chest: New small RIGHT effusion. No pericardial effusion. There is new precordial adenopathy anterior to the RIGHT heart and liver (image 9/2). Hepatobiliary: New intraperitoneal free fluid along the margin of the RIGHT hepatic lobe. This fluid is simple  attenuation. The liver appears unchanged on noncontrast exam. Large cyst the LEFT hepatic lobe. Gallbladder normal. Pancreas: Pancreas is grossly normal noncontrast exam Spleen: . spleen is unchanged Adrenals/urinary tract: Adrenal glands kidneys and ureters normal. Bladder Stomach/Bowel: Stomach duodenum normal. There is a new mass lesion in the LEFT upper quadrant measuring 14.2 by 13.0 cm. The mass may be associated the small bowel or the small bowel mesentery. The descending colon is also involving the mass (image 43/2. There is extensive fluid within the leaves of the mesentery of the distal small bowel. Appendix normal. Ascending transverse colon normal. The descending colon tracks along the new LEFT upper quadrant mass. Vascular/Lymphatic: New large LEFT upper quadrant mass either represents lymphoma involving the small bowel or mesentery. Mass is very large described the stomach section. Additionally there is new periportal adenopathy with enlarged lymph nodes along the aorta measuring up to 2 cm (image 44/2. There is nodularity along the greater omentum abdominal on the LEFT. Reproductive: Prostate unremarkable Other: Small volume free fluid the pelvis. There is enhancing rim to the fluid (image 73/2 which could indicate peritonitis. Musculoskeletal: No aggressive osseous lesion. IMPRESSION: 1. Dominant finding is new  large (15 cm) lymphoid mass within LEFT upper quadrant. Mass is new from PET-CT 01/02/2020. Differential includes massive mesenteric adenopathy versus lymphoma of the small bowel or descending colon. No oral or IV contrast. 2. New periaortic retroperitoneal lymphadenopathy. New precordial adenopathy. 3. Extensive fluid within the mesentery and omental thickening in LEFT lower quadrant. 4. Free fluid the pelvis with thin enhancing rim suggest peritonitis. 5. No evidence of bowel perforation. No evidence of bowel obstruction. Electronically Signed: By: Suzy Bouchard M.D. On: 04/19/2020 12:39    DG Chest 1 View  Result Date: 04/23/2020 CLINICAL DATA:  Elevated blood pressure. EXAM: CHEST  1 VIEW COMPARISON:  April 29, 2020 FINDINGS: The lung volumes are low. There are small bilateral pleural effusions with adjacent atelectasis. The heart size is mildly enlarged. The lung volumes are low. There is no pneumothorax. IMPRESSION: 1. Low lung volumes. 2. Small bilateral pleural effusions with adjacent atelectasis. Electronically Signed   By: Constance Holster M.D.   On: 05/06/2020 20:42   DG Chest 2 View  Result Date: 04/30/2020 CLINICAL DATA:  Shortness of breath lasting several days. Groin swelling and redness. EXAM: CHEST - 2 VIEW COMPARISON:  Chest CT 04/19/20 FINDINGS: Stable cardiomediastinal contours. Low lung volumes. Unchanged small bilateral pleural effusions. No airspace densities. IMPRESSION: Low lung volumes with persistent small bilateral pleural effusions. Electronically Signed   By: Kerby Moors M.D.   On: 05/06/2020 05:09   DG Abd 1 View  Result Date: 05/01/2020 CLINICAL DATA:  Tube placement EXAM: ABDOMEN - 1 VIEW COMPARISON:  April 29, 2020 FINDINGS: The enteric tube projects over the gastric antrum/pylorus. The tube is pointed distally. The visualized bowel gas pattern is unremarkable. IMPRESSION: The enteric tube projects over the gastric antrum/pylorus. Electronically Signed   By: Constance Holster M.D.   On: 05/10/2020 22:05   CT CHEST WO CONTRAST  Result Date: 04/19/2020 CLINICAL DATA:  Pneumonia. Pleural effusion. Concern for metastases. EXAM: CT CHEST WITHOUT CONTRAST TECHNIQUE: Multidetector CT imaging of the chest was performed following the standard protocol without IV contrast. COMPARISON:  CT dated Nov 14, 2019 FINDINGS: Cardiovascular: The heart size is stable. Coronary artery calcifications are noted. There is no significant pericardial effusion. Mediastinum/Nodes: -- No mediastinal lymphadenopathy. -- No hilar lymphadenopathy. --there is left axillary  adenopathy. This has progressed since the prior study. There is mild right axillary adenopathy, progressed from prior study. -- No supraclavicular lymphadenopathy. -- Normal thyroid gland where visualized. -there is mild diffuse esophageal wall thickening. Lungs/Pleura: There are trace to small bilateral pleural effusions, right greater than left. There is atelectasis at the right lung base. There is no pneumothorax. No area of consolidation concerning for pneumonia. Upper Abdomen: There is a partially visualized mass in the left upper quadrant, better visualized on the patient's prior CT. There is free fluid in the upper abdomen. Multiple enlarged pericardiophrenic lymph nodes are noted. There appears to be retroperitoneal adenopathy. Musculoskeletal: No chest wall abnormality. No bony spinal canal stenosis. IMPRESSION: 1. Trace to small bilateral pleural effusions, right greater than left. There is atelectasis at the right lung base. No area of consolidation concerning for pneumonia. 2. Mild diffuse esophageal wall thickening. Correlation with patient's symptoms is recommended. 3. Worsening axillary adenopathy. 4. Partially visualized mass in the left upper quadrant, better visualized on the patient's prior CT. 5. Small volume ascites. Aortic Atherosclerosis (ICD10-I70.0). Electronically Signed   By: Constance Holster M.D.   On: 04/19/2020 20:37   CT Angio Chest PE W and/or Wo Contrast  Result Date: 05/13/2020 CLINICAL DATA:  69 year old male with history of shortness of breath for the past few days. Abdominal distension. History of mantle cell lymphoma. EXAM: CT ANGIOGRAPHY CHEST CT ABDOMEN AND PELVIS WITH CONTRAST TECHNIQUE: Multidetector CT imaging of the chest was performed using the standard protocol during bolus administration of intravenous contrast. Multiplanar CT image reconstructions and MIPs were obtained to evaluate the vascular anatomy. Multidetector CT imaging of the abdomen and pelvis was  performed using the standard protocol during bolus administration of intravenous contrast. CONTRAST:  160m OMNIPAQUE IOHEXOL 350 MG/ML SOLN COMPARISON:  CT the abdomen and pelvis 04/19/2020. CT the chest 04/19/2020. FINDINGS: CTA CHEST FINDINGS Cardiovascular: Study is limited by patient respiratory motion. However, despite this limitation there are segmental sized filling defects within pulmonary arteries to the left upper and lower lobes best appreciated on axial images 73 and 106 of series 5, and axial image 148 of series 5 respectively, compatible with pulmonary embolism. Heart size is normal. There is no significant pericardial fluid, thickening or pericardial calcification. Aortic atherosclerosis. No definite coronary artery calcifications. Mediastinum/Nodes: Multiple enlarged anterior mediastinal lymph nodes noted inferiorly, immediately above the right hemidiaphragm, measuring up to 2.3 cm in short axis (axial image 13 of series 11), increased compared to the prior study. Prominent but nonenlarged distal paraesophageal lymph node measuring 8 mm in short axis, nonspecific. Circumferential thickening of the distal third of the esophagus. Mildly enlarged left axillary lymph nodes measuring up to 1.3 cm in short axis. Lungs/Pleura: Collapse of the trachea and mainstem bronchi during expiration, indicative of tracheobronchomalacia. No acute consolidative airspace disease. Small bilateral pleural effusions lying dependently. No definite suspicious appearing pulmonary nodules or masses are noted. Musculoskeletal: There are no aggressive appearing lytic or blastic lesions noted in the visualized portions of the skeleton. Review of the MIP images confirms the above findings. CT ABDOMEN and PELVIS FINDINGS Hepatobiliary: Multiple well-defined low-attenuation lesions are again noted throughout the liver, similar in size, number and distribution to the prior study, largest of which are compatible with simple cysts  measuring up to 5.3 x 3.7 cm in segment 2 and for a (axial image 18 of series 11). Smaller lesions are too small to definitively characterize, but also favored to represent small cysts or biliary hamartomas. No new suspicious hepatic lesions. No intra or extrahepatic biliary ductal dilatation. 6 mm calcified gallstone lying dependently in the gallbladder. Gallbladder is not distended. Pancreas: No pancreatic mass. No pancreatic ductal dilatation. No pancreatic or peripancreatic fluid collections or inflammatory changes. Spleen: Unremarkable. Adrenals/Urinary Tract: 1.2 cm low-attenuation lesion in the upper pole of the right kidney and exophytic 2.4 cm low-attenuation lesion in the upper pole of the left kidney, compatible with simple cysts. Other subcentimeter low-attenuation lesions in both kidneys, too small to definitively characterize, but favored to represent tiny cysts. No hydroureteronephrosis. Urinary bladder is nearly decompressed, but otherwise unremarkable in appearance. Stomach/Bowel: Stomach is unremarkable in appearance. No pathologic dilatation of small bowel or colon. Multiple bowel loops in the left upper quadrant are intimately associated with the large soft tissue mass (discussed below). Colonic diverticulosis, particularly in the sigmoid colon, without definitive evidence to suggest an acute diverticulitis at this time. Vascular/Lymphatic: Aortic atherosclerosis, without evidence of aneurysm or dissection in the abdominal or pelvic vasculature. Extensive lymphadenopathy noted in the abdomen, most evident in the retroperitoneum where there are bulky para-aortic nodal masses measuring up to 2.3 x 4.1 cm near the left renal artery (axial image 41 of series 11), 3.2 x 4.6  cm adjacent to the infrarenal abdominal aorta (axial image 48 of series 11), and 3.4 x 3.2 cm adjacent to the aortic bifurcation (axial image 54 of series 11), all of which appear similar to the recent prior study. Bulky celiac  axis and gastrohepatic ligament lymph nodes are noted, measuring up to 1.9 cm in short axis (axial image 24 of series 11). Several prominent but nonenlarged pelvic lymph nodes are noted (nonspecific). Reproductive: Prostate gland and seminal vesicles are unremarkable in appearance. Other: Bulky poorly defined soft tissue mass in the left upper quadrant of the abdomen which is intimately associated with multiple adjacent bowel loops, and difficult to distinctly defined, but estimated to measure approximately 15.5 x 13.5 x 13.4 cm (axial image 38 of series 11 and coronal image 53 of series 14). Moderate volume of ascites. Diffuse enhancement of the peritoneal membranes, indicative of widespread peritoneal metastatic disease. Extensive omental caking, best appreciated in the left side of the abdomen where the bulkiest portion of this measures approximately 14.9 x 4.0 cm (axial image 47 of series 11). No pneumoperitoneum. Musculoskeletal: There are no aggressive appearing lytic or blastic lesions noted in the visualized portions of the skeleton. Review of the MIP images confirms the above findings. IMPRESSION: 1. Study is positive for segmental sized pulmonary emboli in the left lung. These appear nonocclusive at this time, and there is no associated pulmonary hemorrhage to suggest frank pulmonary infarct. 2. Bulky soft tissue mass in the left upper quadrant with extensive lymphadenopathy in the chest and abdomen, and evidence of widespread intraperitoneal metastatic disease, as detailed above. 3. Small bilateral pleural effusions lying dependently. 4. Aortic atherosclerosis. 5. Tracheobronchomalacia. 6. Cholelithiasis. 7. Colonic diverticulosis. Electronically Signed   By: Vinnie Langton M.D.   On: 04/30/2020 12:50   CT Abdomen Pelvis W Contrast  Result Date: 05/12/2020 CLINICAL DATA:  69 year old male with history of shortness of breath for the past few days. Abdominal distension. History of mantle cell  lymphoma. EXAM: CT ANGIOGRAPHY CHEST CT ABDOMEN AND PELVIS WITH CONTRAST TECHNIQUE: Multidetector CT imaging of the chest was performed using the standard protocol during bolus administration of intravenous contrast. Multiplanar CT image reconstructions and MIPs were obtained to evaluate the vascular anatomy. Multidetector CT imaging of the abdomen and pelvis was performed using the standard protocol during bolus administration of intravenous contrast. CONTRAST:  179m OMNIPAQUE IOHEXOL 350 MG/ML SOLN COMPARISON:  CT the abdomen and pelvis 04/19/2020. CT the chest 04/19/2020. FINDINGS: CTA CHEST FINDINGS Cardiovascular: Study is limited by patient respiratory motion. However, despite this limitation there are segmental sized filling defects within pulmonary arteries to the left upper and lower lobes best appreciated on axial images 73 and 106 of series 5, and axial image 148 of series 5 respectively, compatible with pulmonary embolism. Heart size is normal. There is no significant pericardial fluid, thickening or pericardial calcification. Aortic atherosclerosis. No definite coronary artery calcifications. Mediastinum/Nodes: Multiple enlarged anterior mediastinal lymph nodes noted inferiorly, immediately above the right hemidiaphragm, measuring up to 2.3 cm in short axis (axial image 13 of series 11), increased compared to the prior study. Prominent but nonenlarged distal paraesophageal lymph node measuring 8 mm in short axis, nonspecific. Circumferential thickening of the distal third of the esophagus. Mildly enlarged left axillary lymph nodes measuring up to 1.3 cm in short axis. Lungs/Pleura: Collapse of the trachea and mainstem bronchi during expiration, indicative of tracheobronchomalacia. No acute consolidative airspace disease. Small bilateral pleural effusions lying dependently. No definite suspicious appearing pulmonary nodules or masses are  noted. Musculoskeletal: There are no aggressive appearing lytic or  blastic lesions noted in the visualized portions of the skeleton. Review of the MIP images confirms the above findings. CT ABDOMEN and PELVIS FINDINGS Hepatobiliary: Multiple well-defined low-attenuation lesions are again noted throughout the liver, similar in size, number and distribution to the prior study, largest of which are compatible with simple cysts measuring up to 5.3 x 3.7 cm in segment 2 and for a (axial image 18 of series 11). Smaller lesions are too small to definitively characterize, but also favored to represent small cysts or biliary hamartomas. No new suspicious hepatic lesions. No intra or extrahepatic biliary ductal dilatation. 6 mm calcified gallstone lying dependently in the gallbladder. Gallbladder is not distended. Pancreas: No pancreatic mass. No pancreatic ductal dilatation. No pancreatic or peripancreatic fluid collections or inflammatory changes. Spleen: Unremarkable. Adrenals/Urinary Tract: 1.2 cm low-attenuation lesion in the upper pole of the right kidney and exophytic 2.4 cm low-attenuation lesion in the upper pole of the left kidney, compatible with simple cysts. Other subcentimeter low-attenuation lesions in both kidneys, too small to definitively characterize, but favored to represent tiny cysts. No hydroureteronephrosis. Urinary bladder is nearly decompressed, but otherwise unremarkable in appearance. Stomach/Bowel: Stomach is unremarkable in appearance. No pathologic dilatation of small bowel or colon. Multiple bowel loops in the left upper quadrant are intimately associated with the large soft tissue mass (discussed below). Colonic diverticulosis, particularly in the sigmoid colon, without definitive evidence to suggest an acute diverticulitis at this time. Vascular/Lymphatic: Aortic atherosclerosis, without evidence of aneurysm or dissection in the abdominal or pelvic vasculature. Extensive lymphadenopathy noted in the abdomen, most evident in the retroperitoneum where there  are bulky para-aortic nodal masses measuring up to 2.3 x 4.1 cm near the left renal artery (axial image 41 of series 11), 3.2 x 4.6 cm adjacent to the infrarenal abdominal aorta (axial image 48 of series 11), and 3.4 x 3.2 cm adjacent to the aortic bifurcation (axial image 54 of series 11), all of which appear similar to the recent prior study. Bulky celiac axis and gastrohepatic ligament lymph nodes are noted, measuring up to 1.9 cm in short axis (axial image 24 of series 11). Several prominent but nonenlarged pelvic lymph nodes are noted (nonspecific). Reproductive: Prostate gland and seminal vesicles are unremarkable in appearance. Other: Bulky poorly defined soft tissue mass in the left upper quadrant of the abdomen which is intimately associated with multiple adjacent bowel loops, and difficult to distinctly defined, but estimated to measure approximately 15.5 x 13.5 x 13.4 cm (axial image 38 of series 11 and coronal image 53 of series 14). Moderate volume of ascites. Diffuse enhancement of the peritoneal membranes, indicative of widespread peritoneal metastatic disease. Extensive omental caking, best appreciated in the left side of the abdomen where the bulkiest portion of this measures approximately 14.9 x 4.0 cm (axial image 47 of series 11). No pneumoperitoneum. Musculoskeletal: There are no aggressive appearing lytic or blastic lesions noted in the visualized portions of the skeleton. Review of the MIP images confirms the above findings. IMPRESSION: 1. Study is positive for segmental sized pulmonary emboli in the left lung. These appear nonocclusive at this time, and there is no associated pulmonary hemorrhage to suggest frank pulmonary infarct. 2. Bulky soft tissue mass in the left upper quadrant with extensive lymphadenopathy in the chest and abdomen, and evidence of widespread intraperitoneal metastatic disease, as detailed above. 3. Small bilateral pleural effusions lying dependently. 4. Aortic  atherosclerosis. 5. Tracheobronchomalacia. 6. Cholelithiasis. 7. Colonic  diverticulosis. Electronically Signed   By: Vinnie Langton M.D.   On: 04/28/2020 12:50   DG CHEST PORT 1 VIEW  Result Date: 05/06/2020 CLINICAL DATA:  Increased shortness of breath, hypertension EXAM: PORTABLE CHEST 1 VIEW COMPARISON:  04/30/2020 FINDINGS: Single frontal view of the chest demonstrates interval extubation and removal of the enteric catheter. Right internal jugular catheter tip projects over superior vena cava. The cardiac silhouette is stable. Lung volumes are diminished, without airspace disease, effusion, or pneumothorax. IMPRESSION: 1. Low lung volumes.  No acute process. Electronically Signed   By: Randa Ngo M.D.   On: 05/06/2020 18:51   DG CHEST PORT 1 VIEW  Result Date: 04/30/2020 CLINICAL DATA:  Central line placement EXAM: PORTABLE CHEST 1 VIEW COMPARISON:  Radiograph 04/27/2020 FINDINGS: Endotracheal tube tip terminates 2.2 cm from the expected location of the carina. Transesophageal tube tip and side port distal to the GE junction. Right IJ catheter tip terminates in the right atrium. Telemetry leads overlie the chest. Layering bilateral effusions adjacent passive atelectasis. Additional bandlike subsegmental atelectasis in the lungs as well. Stable borderline cardiomegaly accounting for portable technique. No acute osseous or soft tissue abnormality. Degenerative changes are present in the imaged spine and shoulders. IMPRESSION: 1. Endotracheal tube tip terminates 2.2 cm from the expected location of the carina. Could consider retraction 2 cm to the mid trachea. 2. Transesophageal tube tip and side port distal to the GE junction. 3. Right IJ catheter tip terminates in the right atrium. 4. Layering bilateral effusions with adjacent passive atelectasis and vascular congestion. 5. Stable borderline cardiomegaly. Electronically Signed   By: Lovena Le M.D.   On: 04/30/2020 01:14   DG CHEST PORT 1  VIEW  Result Date: 04/24/2020 CLINICAL DATA:  Endotracheal tube placement EXAM: PORTABLE CHEST 1 VIEW COMPARISON:  April 29, 2020 FINDINGS: The endotracheal tube terminates above the carina. The lung volumes are low. Again identified are bilateral pleural effusions with adjacent atelectasis. The heart size is stable. There is no pneumothorax. The enteric tube terminates below the left hemidiaphragm. IMPRESSION: Lines and tubes as above.  Otherwise, stable exam. Electronically Signed   By: Constance Holster M.D.   On: 05/09/2020 22:04   DG Chest Portable 1 View  Result Date: 04/19/2020 CLINICAL DATA:  Cough, wheezing EXAM: PORTABLE CHEST 1 VIEW COMPARISON:  01/09/2020 FINDINGS: Low lung volumes. Minimal atelectasis or scarring at the left lung base. No pleural effusion or pneumothorax. Heart size is within normal limits for technique. IMPRESSION: Minimal atelectasis or scarring at the left lung base. Electronically Signed   By: Macy Mis M.D.   On: 04/19/2020 12:18   DG Abd Portable 2 Views  Result Date: 04/26/2020 CLINICAL DATA:  Abdominal distension and pain. EXAM: PORTABLE ABDOMEN - 2 VIEW COMPARISON:  04/19/2020 FINDINGS: There is mild gaseous distension of the stomach. No dilated loops of large or small bowel. There is no evidence of free air. No radio-opaque calculi or other significant radiographic abnormality is seen. IMPRESSION: Nonobstructive bowel gas pattern. Electronically Signed   By: Kerby Moors M.D.   On: 04/24/2020 06:12   DG Swallowing Func-Speech Pathology  Result Date: 05/04/2020 Objective Swallowing Evaluation: Type of Study: MBS-Modified Barium Swallow Study  Patient Details Name: Lessie Manigo MRN: [id number] Date of Birth: 08-21-1950 Today's Date: 05/04/2020 Time: SLP Start Time (ACUTE ONLY): 0854 -SLP Stop Time (ACUTE ONLY): 0910 SLP Time Calculation (min) (ACUTE ONLY): 16 min Past Medical History: Past Medical History: Diagnosis Date . Cancer ([place])   Mantle  Cell  Lymphoma . Diabetes mellitus without complication ([place])  . Hypertension  Past Surgical History: Past Surgical History: Procedure Laterality Date . APPENDECTOMY   . TONSILLECTOMY   HPI: Berkeley Vanaken is a 69 y.o. male with medical history significant of mantle cell lymphoma on CHOP. Presenting with dyspnea and ab distention. Reports 2 weeks of shortness of breath that has worsened in the last 2 days. ETT 11/7-11/9.  Subjective: alert at bedside Assessment / Plan / Recommendation CHL IP CLINICAL IMPRESSIONS 05/04/2020 Clinical Impression Pt demonstrated oropharyngeal dysphagia with aspiration of nectar thick barium marked by delayed initiation and incompetent laryngeal closure. Barium aggregated in his valleculae and pyriform sinuses for approximately 4 seconds filling pyriforms and spilling to vocal cords prior to laryngeal closure. Subsequent trial nectar was aspirated with cough not clearing trachea. He coughed intermittently throughout without new penetrates/aspirates suspect from previous intrusion. Mild delay with honey thick with mild pharyngeal residue. Inconsistent suboptimal movement of hyoid anteriorally and superiorally. Pt clearly fatigued at onset of and during study with increased respirations and decreased endurance. Compensatory strategies not attempted given level of current ability to perform from medical standpoint. Mild lingual residue cleared with independent swallows. Recommend he initiate Dys 1 (puree), honey thick liquid cautiously with pills whole in puree, support for self feeding and rest breaks. Recommendations may be modified if there are changes to plan of care in terms of Palliative.          SLP Visit Diagnosis Dysphagia, oropharyngeal phase (R13.12) Attention and concentration deficit following -- Frontal lobe and executive function deficit following -- Impact on safety and function Moderate aspiration risk   CHL IP TREATMENT RECOMMENDATION 05/04/2020 Treatment Recommendations Therapy  as outlined in treatment plan below   Prognosis 05/04/2020 Prognosis for Safe Diet Advancement Good Barriers to Reach Goals (No Data) Barriers/Prognosis Comment -- CHL IP DIET RECOMMENDATION 05/04/2020 SLP Diet Recommendations Honey thick liquids;Dysphagia 1 (Puree) solids Liquid Administration via Cup;No straw Medication Administration Whole meds with puree Compensations Slow rate;Small sips/bites;Other (Comment) Postural Changes Seated upright at 90 degrees   CHL IP OTHER RECOMMENDATIONS 05/04/2020 Recommended Consults -- Oral Care Recommendations Oral care BID Other Recommendations Order thickener from pharmacy   CHL IP FOLLOW UP RECOMMENDATIONS 05/04/2020 Follow up Recommendations Other (comment)   CHL IP FREQUENCY AND DURATION 05/04/2020 Speech Therapy Frequency (ACUTE ONLY) min 2x/week Treatment Duration 2 weeks      CHL IP ORAL PHASE 05/04/2020 Oral Phase Impaired Oral - Pudding Teaspoon -- Oral - Pudding Cup -- Oral - Honey Teaspoon -- Oral - Honey Cup Lingual/palatal residue Oral - Nectar Teaspoon -- Oral - Nectar Cup Lingual/palatal residue Oral - Nectar Straw -- Oral - Thin Teaspoon -- Oral - Thin Cup -- Oral - Thin Straw -- Oral - Puree -- Oral - Mech Soft -- Oral - Regular Lingual/palatal residue Oral - Multi-Consistency -- Oral - Pill -- Oral Phase - Comment --  CHL IP PHARYNGEAL PHASE 05/04/2020 Pharyngeal Phase Impaired Pharyngeal- Pudding Teaspoon -- Pharyngeal -- Pharyngeal- Pudding Cup -- Pharyngeal -- Pharyngeal- Honey Teaspoon -- Pharyngeal -- Pharyngeal- Honey Cup Delayed swallow initiation-vallecula;Pharyngeal residue - pyriform;Pharyngeal residue - valleculae Pharyngeal -- Pharyngeal- Nectar Teaspoon -- Pharyngeal -- Pharyngeal- Nectar Cup Delayed swallow initiation-pyriform sinuses;Penetration/Aspiration during swallow;Pharyngeal residue - pyriform Pharyngeal Material enters airway, remains ABOVE vocal cords and not ejected out;Material enters airway, passes BELOW cords and not ejected out  despite cough attempt by patient Pharyngeal- Nectar Straw -- Pharyngeal -- Pharyngeal- Thin Teaspoon -- Pharyngeal -- Pharyngeal- Thin Cup --  Pharyngeal -- Pharyngeal- Thin Straw -- Pharyngeal -- Pharyngeal- Puree -- Pharyngeal -- Pharyngeal- Mechanical Soft -- Pharyngeal -- Pharyngeal- Regular Pharyngeal residue - posterior pharnyx Pharyngeal -- Pharyngeal- Multi-consistency -- Pharyngeal -- Pharyngeal- Pill -- Pharyngeal -- Pharyngeal Comment --  CHL IP CERVICAL ESOPHAGEAL PHASE 05/04/2020 Cervical Esophageal Phase WFL Pudding Teaspoon -- Pudding Cup -- Honey Teaspoon -- Honey Cup -- Nectar Teaspoon -- Nectar Cup -- Nectar Straw -- Thin Teaspoon -- Thin Cup -- Thin Straw -- Puree -- Mechanical Soft -- Regular -- Multi-consistency -- Pill -- Cervical Esophageal Comment -- [place] Siren 05/04/2020, 10:56 AM Orbie Pyo Colvin Caroli.Ed Actor Pager [phone] Office [phone]              ECHOCARDIOGRAM COMPLETE  Result Date: 04/20/2020    ECHOCARDIOGRAM REPORT   Patient Name:   WALEED DETTMAN Date of Exam: 04/20/2020 Medical Rec #:  [id number]    Height:       64.0 in Accession #:    [id number]   Weight:       210.1 lb Date of Birth:  1950-08-22   BSA:          1.998 m Patient Age:    47 years     BP:           156/83 mmHg Patient Gender: M            HR:           88 bpm. Exam Location:  Inpatient Procedure: 2D Echo, Cardiac Doppler and Color Doppler Indications:    Chemo evaluation  History:        Patient has prior history of Echocardiogram examinations, most                 recent 11/14/2019. COPD; Risk Factors:Hypertension and Diabetes.                 Mantle cell lymphoma.  Sonographer:    Dustin Flock Referring Phys: [place]  1. Left ventricular ejection fraction, by estimation, is 65 to 70%. The left ventricle has normal function. The left ventricle has no regional wall motion abnormalities. Left ventricular diastolic parameters are consistent  with Grade I diastolic dysfunction (impaired relaxation).  2. Right ventricular systolic function is normal. The right ventricular size is normal. Tricuspid regurgitation signal is inadequate for assessing PA pressure.  3. The mitral valve is grossly normal. Trivial mitral valve regurgitation. No evidence of mitral stenosis.  4. The aortic valve is tricuspid. There is mild calcification of the aortic valve. There is mild thickening of the aortic valve. Aortic valve regurgitation is not visualized. Mild aortic valve sclerosis is present, with no evidence of aortic valve stenosis.  5. The inferior vena cava is normal in size with greater than 50% respiratory variability, suggesting right atrial pressure of 3 mmHg. Comparison(s): Changes from prior study are noted. EF is now 65-70%. FINDINGS  Left Ventricle: Left ventricular ejection fraction, by estimation, is 65 to 70%. The left ventricle has normal function. The left ventricle has no regional wall motion abnormalities. The left ventricular internal cavity size was normal in size. There is  no left ventricular hypertrophy. Left ventricular diastolic parameters are consistent with Grade I diastolic dysfunction (impaired relaxation). Normal left ventricular filling pressure. Right Ventricle: The right ventricular size is normal. No increase in right ventricular wall thickness. Right ventricular systolic function is normal. Tricuspid regurgitation signal is inadequate for assessing PA pressure. Left Atrium: Left atrial  size was normal in size. Right Atrium: Right atrial size was normal in size. Pericardium: Trivial pericardial effusion is present. Presence of pericardial fat pad. Mitral Valve: The mitral valve is grossly normal. Trivial mitral valve regurgitation. No evidence of mitral valve stenosis. Tricuspid Valve: The tricuspid valve is grossly normal. Tricuspid valve regurgitation is not demonstrated. No evidence of tricuspid stenosis. Aortic Valve: The aortic  valve is tricuspid. There is mild calcification of the aortic valve. There is mild thickening of the aortic valve. Aortic valve regurgitation is not visualized. Mild aortic valve sclerosis is present, with no evidence of aortic valve stenosis. Aortic valve mean gradient measures 11.1 mmHg. Aortic valve peak gradient measures 16.7 mmHg. Aortic valve area, by VTI measures 2.42 cm. Pulmonic Valve: The pulmonic valve was grossly normal. Pulmonic valve regurgitation is not visualized. No evidence of pulmonic stenosis. Aorta: The aortic root is normal in size and structure. Venous: The right upper pulmonary vein is normal. The inferior vena cava is normal in size with greater than 50% respiratory variability, suggesting right atrial pressure of 3 mmHg. IAS/Shunts: The atrial septum is grossly normal. Additional Comments: There is a small pleural effusion in the left lateral region.  LEFT VENTRICLE PLAX 2D LVIDd:         4.30 cm  Diastology LVIDs:         2.60 cm  LV e' medial:    8.81 cm/s LV PW:         1.10 cm  LV E/e' medial:  7.2 LV IVS:        1.10 cm  LV e' lateral:   7.51 cm/s LVOT diam:     2.30 cm  LV E/e' lateral: 8.4 LV SV:         92 LV SV Index:   46 LVOT Area:     4.15 cm  RIGHT VENTRICLE RV Basal diam:  3.20 cm RV S prime:     11.00 cm/s TAPSE (M-mode): 2.9 cm LEFT ATRIUM             Index       RIGHT ATRIUM           Index LA diam:        3.30 cm 1.65 cm/m  RA Area:     15.30 cm LA Vol (A2C):   43.2 ml 21.62 ml/m RA Volume:   37.60 ml  18.82 ml/m LA Vol (A4C):   30.7 ml 15.37 ml/m LA Biplane Vol: 36.9 ml 18.47 ml/m  AORTIC VALVE AV Area (Vmax):    2.42 cm AV Area (Vmean):   2.14 cm AV Area (VTI):     2.42 cm AV Vmax:           204.20 cm/s AV Vmean:          159.222 cm/s AV VTI:            0.381 m AV Peak Grad:      16.7 mmHg AV Mean Grad:      11.1 mmHg LVOT Vmax:         119.00 cm/s LVOT Vmean:        82.200 cm/s LVOT VTI:          0.222 m LVOT/AV VTI ratio: 0.58  AORTA Ao Root diam: 3.10 cm  MITRAL VALVE MV Area (PHT): 4.06 cm     SHUNTS MV Decel Time: 187 msec     Systemic VTI:  0.22 m MV E velocity: 63.40 cm/s  Systemic Diam: 2.30 cm MV A velocity: 104.00 cm/s MV E/A ratio:  0.61 Eleonore Chiquito MD Electronically signed by Eleonore Chiquito MD Signature Date/Time: 04/20/2020/10:32:25 AM    Final    [place] ASCITES (ABDOMEN LIMITED)  Result Date: 04/19/2020 CLINICAL DATA:  69 year old male with abdominal distension, ascites EXAM: LIMITED ABDOMEN ULTRASOUND FOR ASCITES TECHNIQUE: Limited ultrasound survey for ascites was performed in all four abdominal quadrants. COMPARISON:  CT abdomen/pelvis 04/19/2020 FINDINGS: Sonographic interrogation of the abdomen was performed in anticipation of paracentesis. Unfortunately, the volume is a ascites is quite small and insufficient for drainage. There is however extensive omental caking and peritoneal nodularity. IMPRESSION: 1. Small volume ascites, insufficient for paracentesis. 2. Extensive omental caking and peritoneal nodularity consistent with peritoneal carcinomatosis. Electronically Signed   By: Jacqulynn Cadet M.D.   On: 04/19/2020 16:53   VAS [place] LOWER EXTREMITY VENOUS (DVT)  Result Date: 05/01/2020  Lower Venous DVT Study Indications: Pain, Swelling, and Edema.  Risk Factors: Immobility confirmed PE. Performing Technologist: Griffin Basil RCT RDMS  Examination Guidelines: A complete evaluation includes B-mode imaging, spectral Doppler, color Doppler, and power Doppler as needed of all accessible portions of each vessel. Bilateral testing is considered an integral part of a complete examination. Limited examinations for reoccurring indications may be performed as noted. The reflux portion of the exam is performed with the patient in reverse Trendelenburg.  +---------+---------------+---------+-----------+----------+--------------+ RIGHT    CompressibilityPhasicitySpontaneityPropertiesThrombus Aging  +---------+---------------+---------+-----------+----------+--------------+ CFV      Full           Yes      Yes                                 +---------+---------------+---------+-----------+----------+--------------+ SFJ      Full                                                        +---------+---------------+---------+-----------+----------+--------------+ FV Prox  Full                                                        +---------+---------------+---------+-----------+----------+--------------+ FV Mid   Full                                                        +---------+---------------+---------+-----------+----------+--------------+ FV DistalFull                                                        +---------+---------------+---------+-----------+----------+--------------+ PFV      Full                                                        +---------+---------------+---------+-----------+----------+--------------+  POP      Full           Yes      Yes                                 +---------+---------------+---------+-----------+----------+--------------+ PTV      Full                                                        +---------+---------------+---------+-----------+----------+--------------+ PERO     Full                                                        +---------+---------------+---------+-----------+----------+--------------+   +---------+---------------+---------+-----------+----------+--------------+ LEFT     CompressibilityPhasicitySpontaneityPropertiesThrombus Aging +---------+---------------+---------+-----------+----------+--------------+ CFV      Full           Yes      Yes                                 +---------+---------------+---------+-----------+----------+--------------+ SFJ      Full                                                         +---------+---------------+---------+-----------+----------+--------------+ FV Prox  Full                                                        +---------+---------------+---------+-----------+----------+--------------+ FV Mid   Full                                                        +---------+---------------+---------+-----------+----------+--------------+ FV DistalFull                                                        +---------+---------------+---------+-----------+----------+--------------+ PFV      Full                                                        +---------+---------------+---------+-----------+----------+--------------+ POP      Full           Yes      Yes                                 +---------+---------------+---------+-----------+----------+--------------+  PTV      Full                                                        +---------+---------------+---------+-----------+----------+--------------+ PERO     Full                                                        +---------+---------------+---------+-----------+----------+--------------+     Summary: RIGHT: - There is no evidence of deep vein thrombosis in the lower extremity.  - No cystic structure found in the popliteal fossa.  LEFT: - There is no evidence of deep vein thrombosis in the lower extremity.  - No cystic structure found in the popliteal fossa.  *See table(s) above for measurements and observations. Electronically signed by Gretta Began MD on 05/01/2020 at 2:50:45 PM.    Final    ECHOCARDIOGRAM LIMITED  Result Date: 04/30/2020    ECHOCARDIOGRAM LIMITED REPORT   Patient Name:   JOHNPATRICK JENNY Date of Exam: 04/30/2020 Medical Rec #:  [id number]    Height:       64.0 in Accession #:    [id number]   Weight:       205.0 lb Date of Birth:  21-Sep-1950   BSA:          1.977 m Patient Age:    68 years     BP:           114/60 mmHg Patient Gender: M            HR:           109  bpm. Exam Location:  Inpatient Procedure: Limited Echo, Limited Color Doppler and Cardiac Doppler Indications:    Pulmonary Embolus I26.99  History:        Patient has prior history of Echocardiogram examinations, most                 recent 04/20/2020. Mantle cell lymphoma; Risk                 Factors:Hypertension and Diabetes.  Sonographer:    Thurman Coyer RDCS (AE) Referring Phys: [id number] Teddy Spike  Sonographer Comments: Echo performed with patient supine and on artificial respirator. IMPRESSIONS  1. Cystic structure noted in liver on Subcostal images.  2. Left ventricular ejection fraction, by estimation, is 60 to 65%. The left ventricle has normal function. The left ventricle has no regional wall motion abnormalities.  3. Right ventricular systolic function is normal. The right ventricular size is normal.  4. The pericardial effusion is posterior to the left ventricle.  5. The mitral valve is normal in structure. No evidence of mitral valve regurgitation. No evidence of mitral stenosis.  6. The aortic valve is normal in structure. Aortic valve regurgitation is not visualized. No aortic stenosis is present.  7. The inferior vena cava is normal in size with greater than 50% respiratory variability, suggesting right atrial pressure of 3 mmHg. FINDINGS  Left Ventricle: Left ventricular ejection fraction, by estimation, is 60 to 65%. The left ventricle has normal function. The left ventricle has no regional wall motion abnormalities. The left ventricular internal cavity size  was normal in size. There is  no left ventricular hypertrophy. Right Ventricle: The right ventricular size is normal. No increase in right ventricular wall thickness. Right ventricular systolic function is normal. Left Atrium: Left atrial size was normal in size. Right Atrium: Right atrial size was normal in size. Pericardium: Trivial pericardial effusion is present. The pericardial effusion is posterior to the left ventricle. Mitral  Valve: The mitral valve is normal in structure. No evidence of mitral valve stenosis. Tricuspid Valve: The tricuspid valve is normal in structure. Tricuspid valve regurgitation is mild . No evidence of tricuspid stenosis. Aortic Valve: The aortic valve is normal in structure. Aortic valve regurgitation is not visualized. No aortic stenosis is present. Pulmonic Valve: The pulmonic valve was normal in structure. Pulmonic valve regurgitation is trivial. No evidence of pulmonic stenosis. Aorta: The aortic root is normal in size and structure. Venous: The inferior vena cava is normal in size with greater than 50% respiratory variability, suggesting right atrial pressure of 3 mmHg. IAS/Shunts: No atrial level shunt detected by color flow Doppler. Additional Comments: Cystic structure noted in liver on Subcostal images. LEFT VENTRICLE PLAX 2D LVIDd:         4.20 cm LVIDs:         2.60 cm LV PW:         1.00 cm LV IVS:        1.10 cm LVOT diam:     2.30 cm LVOT Area:     4.15 cm  RIGHT VENTRICLE RV S prime:     30.80 cm/s TAPSE (M-mode): 1.8 cm LEFT ATRIUM         Index LA diam:    3.00 cm 1.52 cm/m   AORTA Ao Root diam: 3.40 cm  SHUNTS Systemic Diam: 2.30 cm Jenkins Rouge MD Electronically signed by Jenkins Rouge MD Signature Date/Time: 04/30/2020/9:18:44 AM    Final    [place] EKG SITE RITE  Result Date: 05/03/2020 If Site Rite image not attached, placement could not be confirmed due to current cardiac rhythm.  [place] EKG SITE RITE  Result Date: 04/19/2020 If Site Rite image not attached, placement could not be confirmed due to current cardiac rhythm.   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 CD5+ clonal lymphoproliferative disorder" -04/26/2019 FISH revealed "Positive for p53 (17p13) deletion." -05/31/2019 PET/CT ([id number]) revealed "1. There are  multiple prominent lymph nodes in the neck, both axilla, the retroperitoneum and pelvis with low level hypermetabolic activity consistent with chronic lymphocytic leukemia. Deauville 3 and 4. 2. Mild splenomegaly and mild generalized splenic hypermetabolic activity 3. No evidence of bone marrow involvement." -06/10/2019 Flow Pathology Report ([id number]) revealed "Abnormal B-cell population." -06/10/2019 Surgical Pathology Report ([id number])  revealed "Atypical lymphoid proliferation consistent with mantle cell lymphoma." -09/19/19 PET Skull Base to Thigh ([id number])-- favorable response to current treatment -01/02/2020 PET/CT ([id number]) revealed "1. Continued further decrease in size and FDG accumulation in index cervical, axillary, retroperitoneal, and pelvic lymph nodes. No lymphadenopathy by size criteria today. FDG accumulation in these lymph nodes is compatible with Deauville 2 category. 2. Interval decrease in size with resolution of hypermetabolism associated with the spleen previously."  -04/19/2020 CT abdomen pelvis revealed "1. Dominant finding is new large (15 cm) lymphoid mass within LEFT upper quadrant. Mass is new from PET-CT 01/02/2020. Differential includes massive mesenteric adenopathy versus lymphoma of the small bowel or descending  colon. No oral or IV contrast. 2. New periaortic retroperitoneal lymphadenopathy. New precordial Adenopathy. 3. Extensive fluid within the mesentery and omental thickening in LEFT lower quadrant. 4. Free fluid the pelvis with thin enhancing rim suggest peritonitis. 5. No evidence of bowel perforation. No evidence of bowel Obstruction." -05/09/2020 CTA chest and CT abdomen pelvis revealed "1. Study is positive for segmental sized pulmonary emboli in the left lung. These appear nonocclusive at this time, and there is no associated pulmonary hemorrhage to suggest frank pulmonary infarct. 2. Bulky soft tissue mass in the left upper quadrant with extensive  lymphadenopathy in the chest and abdomen, and evidence of widespread intraperitoneal metastatic disease, as detailed above. 3. Small bilateral pleural effusions lying dependently. 4. Aortic atherosclerosis. 5. Tracheobronchomalacia. 6. Cholelithiasis. 7. Colonic diverticulosis."  2) Rigors from Rituxan - needing demerol as premedication. Rituxan rate to be capped at 162m/hour  3) Thrombocytopenia from chemotherapy  4) Neutropenia due to recent chemotherapy  5) anemia  6) significant new abdominal distention due to lymphoma recurrence  7) new hypercalcemia- ?  Related to dehydration and  Lymphoma -resolved at this time  8) acute hypoxic respiratory failure-multifactorial due to abdominal distention, PE, and hypertensive emergency  9) left segmental PE  10) AKI  11) tumor lysis syndrome  12) pancytopenia related to chemotherapy and possible sepsis.  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  He received 1 cycle of CHOP with no improvement in his left upper quadrant soft tissue mass and abdominal distention at this time. -Undergoing radiation to his left upper quadrant soft tissue mass. -Plan to begin acalabrutinib with clinical status improves and his counts improve. -The patient has received G-CSF already and recommend close monitoring of his CBC with differential.  - Transfuse for hemoglobin less than 8 and platelets less than 30,000 or active bleeding. 2 units PRBCs have ordered today per hospitalist.  -Anticoagulation currently on hold pending GI evaluation. -Received rasburicase on 05/01/2020 and 05/03/2020 for hyperuricemia. Uric acid level has normalized. Continue allopurinol. -Monitor renal function closely and consider nephrology consult if worsening.     LOS: 8 days   KMikey Bussing   ADDENDUM  .Patient was Personally and independently interviewed, examined and relevant elements of the history of present  illness were reviewed in details and an assessment and plan was created. All elements of the patient's history of present illness , assessment and plan were discussed in details with KMikey Bussing. The above documentation reflects our combined findings assessment and plan.  GSullivan LoneMD MS

## 2020-05-07 NOTE — Progress Notes (Signed)
PT Cancellation Note  Patient Details Name: Dean Dixon MRN: [id number] DOB: [birth year]   Cancelled Treatment:     pt unable to tolerate toady schedule to receive blood and was just given Ativan.  Will attempt to see tomorrow as schedule permits.  Pt has been evaluated so will continue to follow.   Rica Koyanagi  PTA Acute  Rehabilitation Services Pager      [phone] Office      [phone]

## 2020-05-08 ENCOUNTER — Inpatient Hospital Stay (HOSPITAL_COMMUNITY): Payer: Medicare Other

## 2020-05-08 ENCOUNTER — Inpatient Hospital Stay: Payer: Self-pay

## 2020-05-08 ENCOUNTER — Ambulatory Visit
Admit: 2020-05-08 | Discharge: 2020-05-08 | Disposition: A | Discharge status: Home / Self Care | Payer: Medicare Other | Attending: Radiation Oncology | Admitting: Radiation Oncology

## 2020-05-08 DIAGNOSIS — I2699 Other pulmonary embolism without acute cor pulmonale: Secondary | ICD-10-CM | POA: Diagnosis not present

## 2020-05-08 DIAGNOSIS — C8318 Mantle cell lymphoma, lymph nodes of multiple sites: Secondary | ICD-10-CM | POA: Diagnosis not present

## 2020-05-08 DIAGNOSIS — R0600 Dyspnea, unspecified: Secondary | ICD-10-CM | POA: Diagnosis not present

## 2020-05-08 LAB — CBC WITH DIFFERENTIAL/PLATELET
Abs Immature Granulocytes: 0.21 10*3/uL — ABNORMAL HIGH (ref 0.00–0.07)
Basophils Absolute: 0 10*3/uL (ref 0.0–0.1)
Basophils Relative: 1 %
Eosinophils Absolute: 0 10*3/uL (ref 0.0–0.5)
Eosinophils Relative: 1 %
HCT: 34.9 % — ABNORMAL LOW (ref 39.0–52.0)
Hemoglobin: 11.2 g/dL — ABNORMAL LOW (ref 13.0–17.0)
Immature Granulocytes: 7 %
Lymphocytes Relative: 8 %
Lymphs Abs: 0.3 10*3/uL — ABNORMAL LOW (ref 0.7–4.0)
MCH: 29.5 pg (ref 26.0–34.0)
MCHC: 32.1 g/dL (ref 30.0–36.0)
MCV: 91.8 fL (ref 80.0–100.0)
Monocytes Absolute: 0.2 10*3/uL (ref 0.1–1.0)
Monocytes Relative: 7 %
Neutro Abs: 2.4 10*3/uL (ref 1.7–7.7)
Neutrophils Relative %: 76 %
Platelets: 36 10*3/uL — ABNORMAL LOW (ref 150–400)
RBC: 3.8 MIL/uL — ABNORMAL LOW (ref 4.22–5.81)
RDW: 17.7 % — ABNORMAL HIGH (ref 11.5–15.5)
WBC: 3.1 10*3/uL — ABNORMAL LOW (ref 4.0–10.5)
nRBC: 1 % — ABNORMAL HIGH (ref 0.0–0.2)

## 2020-05-08 LAB — GLUCOSE, CAPILLARY
Glucose-Capillary: 114 mg/dL — ABNORMAL HIGH (ref 70–99)
Glucose-Capillary: 144 mg/dL — ABNORMAL HIGH (ref 70–99)
Glucose-Capillary: 160 mg/dL — ABNORMAL HIGH (ref 70–99)
Glucose-Capillary: 180 mg/dL — ABNORMAL HIGH (ref 70–99)
Glucose-Capillary: 225 mg/dL — ABNORMAL HIGH (ref 70–99)
Glucose-Capillary: 90 mg/dL (ref 70–99)

## 2020-05-08 LAB — BPAM RBC
Blood Product Expiration Date: 202112212359
Blood Product Expiration Date: 202112232359
ISSUE DATE / TIME: 202111151243
ISSUE DATE / TIME: 202111151725
Unit Type and Rh: 9500
Unit Type and Rh: 9500

## 2020-05-08 LAB — COMPREHENSIVE METABOLIC PANEL
ALT: 15 U/L (ref 0–44)
AST: 21 U/L (ref 15–41)
Albumin: 2.8 g/dL — ABNORMAL LOW (ref 3.5–5.0)
Alkaline Phosphatase: 69 U/L (ref 38–126)
Anion gap: 10 (ref 5–15)
BUN: 56 mg/dL — ABNORMAL HIGH (ref 8–23)
CO2: 21 mmol/L — ABNORMAL LOW (ref 22–32)
Calcium: 7.2 mg/dL — ABNORMAL LOW (ref 8.9–10.3)
Chloride: 117 mmol/L — ABNORMAL HIGH (ref 98–111)
Creatinine, Ser: 1.57 mg/dL — ABNORMAL HIGH (ref 0.61–1.24)
GFR, Estimated: 47 mL/min — ABNORMAL LOW (ref >60)
Glucose, Bld: 97 mg/dL (ref 70–99)
Potassium: 3.6 mmol/L (ref 3.5–5.1)
Sodium: 148 mmol/L — ABNORMAL HIGH (ref 135–145)
Total Bilirubin: 0.7 mg/dL (ref 0.3–1.2)
Total Protein: 5.3 g/dL — ABNORMAL LOW (ref 6.5–8.1)

## 2020-05-08 LAB — TYPE AND SCREEN
ABO/RH(D): O NEG
Antibody Screen: NEGATIVE
Unit division: 0
Unit division: 0

## 2020-05-08 LAB — HEPARIN LEVEL (UNFRACTIONATED)
Heparin Unfractionated: 0.42 IU/mL (ref 0.30–0.70)
Heparin Unfractionated: 0.58 IU/mL (ref 0.30–0.70)

## 2020-05-08 LAB — URIC ACID: Uric Acid, Serum: 1.9 mg/dL — ABNORMAL LOW (ref 3.7–8.6)

## 2020-05-08 MED ORDER — SODIUM CHLORIDE 0.9% FLUSH
10.0000 mL | Freq: Two times a day (BID) | INTRAVENOUS | Status: DC
  Administered 2020-05-08: 10 mL
  Administered 2020-05-09: 3 mL
  Administered 2020-05-10 – 2020-05-21 (×19): 10 mL

## 2020-05-08 MED ORDER — FUROSEMIDE 10 MG/ML IJ SOLN
40.0000 mg | INTRAMUSCULAR | Status: AC
  Administered 2020-05-08: 40 mg via INTRAVENOUS
  Filled 2020-05-08: qty 4

## 2020-05-08 MED ORDER — FUROSEMIDE 10 MG/ML IJ SOLN
40.0000 mg | Freq: Once | INTRAMUSCULAR | Status: AC
  Administered 2020-05-08: 40 mg via INTRAVENOUS
  Filled 2020-05-08: qty 4

## 2020-05-08 MED ORDER — SODIUM CHLORIDE 0.9% FLUSH: 10.0000 mL | INTRAVENOUS | Status: DC | PRN

## 2020-05-08 NOTE — TOC Progression Note (Signed)
Transition of Care [facility]) - Progression Note    Patient Details  Name: Dean Dixon MRN: [id number] Date of Birth: 08-07-1950  Transition of Care [facility]) CM/SW Contact  Dean Cha, RN Phone Number: 05/08/2020, 7:54 AM  Clinical Narrative:    Dean Dixon [id number] transitioned from vent to bipap to hfnc to room air.  No hypoxia presewnt.Iv heaprin, wbc 3.1 with mid left shift. following for toc needs plan is to return to home Following for progression.   Expected Discharge Plan: Home/Self Care Barriers to Discharge: No Barriers Identified  Expected Discharge Plan and Services Expected Discharge Plan: Home/Self Care   Discharge Planning Services: CM Consult   Living arrangements for the past 2 months: Single Family Home                                       Social Determinants of Health (SDOH) Interventions    Readmission Risk Interventions No flowsheet data found.

## 2020-05-08 NOTE — Progress Notes (Addendum)
PROGRESS NOTE    Dean Dixon  IOE:[id number] DOB: [birth year] DOA: 05/13/2020 PCP: Leonides Sake, MD   Chief Complaint  Patient presents with  . Groin Swelling  . Shortness of Breath  . Emesis    Brief Narrative:  69 year old male with stage IV mantle cell lymphoma on CHOP presenting with N/V and progressive SOB over the last 2 weeks found to have acute left segmental PE.  Admitted to [facility] however developed hypertension 250/100 with respiratory distress with chest pain, unable to tolerate BiPAP and was intubated.      History of present illness   HPI obtained from medical chart review as patient is currently intubated and sedated on mechanical ventilation.  69 year old male with prior medical history of stage IV mantle cell lymphoma (diagnosed 06/20/2019) on CHOP, hypertension, diabetes type 2 who presented to [facility] on 11/7 with progressive shortness of breath over the last 2 weeks, worse over the last 2 days with above baseline abdominal distention, non bloody nausea/ vomiting, nonradiating intermittent midsternal chest tightness, and cough with lightly productive greenish nonbloody sputum.  Additionally reported ongoing swelling in his legs and testicles causing difficulty with urination.  Recent hospitalization 10/28 - 11/1 found to have new left upper quadrant abdominal mass and hypercalcemia.    In ER, he was afebrile, hypertensive, mildly tachypneic, and tachycardic with oxygen saturations of 98% on room air.  Noted to have multiple episodes of vomiting and retching treated with zofran.  KUB with non-obstructive gas pattern.   Labs noted for glucose 290 with HgbA1c 6.3, BUN 40, AG 17, lactic acid 3.2-> 2.3, troponin hs 52, BNP 133, D-dimer 2.47, Hgb 11.5/ HCT 35.5, platelets 100, neg COVID/ flu, UA noted turbid urine with 5 ketones, negative for leukocytes/ nitrates.  CXR showed small bilateral pleural effusion with atelectasis and low lung volumes.  He was pretreated given  his contrast allergy. CTA PE and CT abd/ pelvis showed segmental nonocclusive left pulmonary embolism without associated hemorrhage or pulmonary infarct.  Additionally noted bulky soft tissue mass in LUQ with extensive lymphadenopathy in the chest and abd with evidence of widespread intraperitoneal metastatic disease, small bilateral pleural effusion, and  tracheobronchomalacia. Started on heparin gtt per pharmacy.   He was admitted by [facility] to progressive care.  In unit, he was noted to become hypertensive with BP 250/100 with complaints of chest discomfort with noted tachypnea and lethargy.  He was treated with hydralazine and placed on nitro gtt for blood pressure management.  Repeated EKG was non acute.  ABG on NRB noted 7.291/ 56.7/ 275.  Bipap was attempted however patient kept pulling it off and therefore given concern for impending respiratory failure, he was intubated by anesthesia.  PCCM consulted for further management    Assessment & Plan:   Active Problems:   Mantle cell lymphoma (HCC)   Acute on chronic respiratory failure with hypoxia (HCC)   Pulmonary embolism (HCC)   Shortness of breath   Dyspnea and respiratory abnormalities   Intubation of airway performed without difficulty   Pressure injury of skin   Abdominal distension   Tumor lysis syndrome   Antineoplastic chemotherapy induced pancytopenia (HCC)   Tumor lysis syndrome following antineoplastic drug therapy   Neutropenia with fever (HCC)   Acute renal failure (HCC)   Hypernatremia   SOB (shortness of breath)  1 acute hypoxemic respiratory failure/metastatic mantle cell lymphoma on chemotherapy/segmental left-sided PE Status post intubation and extubation.  Patient with noted segmental left-sided PE.  Was  on empiric IV heparin for PE.  Patient the evening of 05/06/2020 with complaints of shortness of breath subsequently placed on the BiPAP with clinical improvement.  Lungs clear to auscultation bilaterally.  Patient  currently on room air with sats of 96 to 97%.  Status post IV Lasix.  Patient with some lower extremity edema likely secondary to third spacing from low albumin levels.  IV heparin resumed.  Supportive care.  Follow.    2.  Acute renal failure Likely secondary to prerenal azotemia.  Urine output 1.4 L recorded over the past 24 hours.  Currently at 1.57.  Continue gentle hydration with D5W due to hypernatremia.  Supportive care.  Follow.  3.  Hypernatremia Continue D5W at 50 cc/h.  LR normal saline discontinued.  Follow.    4.  Acute left segmental PE In the setting of metastatic mantle cell lymphoma.  Lower extremity Dopplers negative for DVT.  2D echo with no right ventricular strain.  Was on IV heparin which was held temporarily due to concern for possible ongoing GI bleed.  Patient assessed by GI and felt patient did not have a melanotic stools all GI bleed and as such heparin resumed 05/07/2020.  Hematology following and hematology to advise when patient can be transitioned to either DOAC versus Lovenox.  Supportive care.  Follow.   5.  Fever/leukopenia  Questionable etiology.  Chest x-ray negative for any acute infiltrates.  CT chest negative for infectious etiology.  Urinalysis unremarkable.  Blood cultures with no growth to date x5 days.  MRSA PCR negative.  COVID-19 PCR negative.  Influenza A and B negative.  Patient currently afebrile.  Status post 7 days IV cefepime.  No further antibiotics needed at this time.  6.  Pancytopenia/ Likely chemotherapy induced.  Per oncology patient received G-CSF already.  WBC at 3.1.  Hemoglobin at 11.2.  Platelet count at 36,000.  Transfusion threshold hemoglobin  < 8, per oncology recommendations and platelet count  < 30k.  Status post transfusion of a total of 4 units packed red blood cells during this hospitalization.  Per oncology.  7.  Concern for melanotic stools ruled out Patient with a pancytopenia.  Felt likely initially chemo induced.  Concern  for GI bleed.  Patient with mantle cell lymphoma with large soft tissue mass in the left upper quadrant of abdomen which is intimately associated with multiple adjacent bowel loops concerned this may be leading to probable GI bleed.  Patient also with extensive omental caking.  Patient with concerns for melanotic stools noted in rectal tube.  Patient was on IV heparin for PE which was held.  Patient was assessed by GI and was felt patient did not have melanotic stools and as such anticoagulation resumed.  Change IV PPI to once daily.  Patient status post transfusion of 2 units packed red blood cells.  Hemoglobin currently at 11.2.  Appreciate GI input and recommendations.  8.  Tumor lysis syndrome Status post rasburicase 05/01/2020 and 05/03/2020 for hyperuricemia.  Uric acid level at 1.9.  Continue allopurinol.     9.  Metastatic stage IV mantle cell lymphoma Per oncology who are following for consideration of oral chemotherapy agents for lymphoma.  Per oncology.  10.  Hypokalemia Repleted.  Potassium at 3.6.  Follow.   11.  Prognosis Patient with metastatic mantle cell lymphoma with large soft tissue mass in the left upper quadrant of the abdomen associated with multiple bowel loops. Patient with extensive omental caking.  Patient mantle cell lymphoma  resistant to chemotherapy.  Patient with tumor lysis syndrome.  Patient now with PE. Patient with aspiration and was on a dysphagia 1 diet.  Patient with a poor prognosis.  Patient being followed by palliative care.  Oncology following.    DVT prophylaxis: Heparin Code Status: Full Family Communication: Updated patient.  No family at bedside. Disposition:   Status is: Inpatient    Dispo: The patient is from: Home              Anticipated d/c is to: To be determined              Anticipated d/c date is: To be determined.              Patient currently with acute PE, on IV heparin, stage IV mantle cell lymphoma being followed by oncology.   Not stable for discharge.    Significant Hospital Events   11/07 Admitted to [place] decompensated/ intubated 11/08 Weaned on PSV, lasix with 2.2L UOP  11/09 Extubated  11/11 Fever / leukocytosis improved, on abx  11/12 Mental status improved, on RA, palliative radiation      Consultants:   PCCM 11/7  Oncology  Gastroenterology: Dr. Loletha Carrow III 05/07/2020  Procedures: Significant Diagnostic Tests:  11/7 KUB >> nonobstructive gas pattern 11/7 CTA PE/  CT abd/ pelvis >> Study is positive for segmental sized pulmonary emboli in the left lung. These appear nonocclusive at this time, and there is no associated pulmonary hemorrhage to suggest frank pulmonary infarct. Bulky soft tissue mass in the left upper quadrant with extensive lymphadenopathy in the chest and abdomen, and evidence of widespread intraperitoneal metastatic disease. Small bilateral pleural effusions lying dependently. Aortic atherosclerosis.  Tracheobronchomalacia. Cholelithiasis. Colonic diverticulosis. 11/8 [place] Ascites/ABD >> small volume ascites, insufficient for paracentesis, extensive omental caking and peritoneal nodularity consistent with peritoneal carcinomatosis  11/8 ECHO >> cystic structure noted in liver on subcostal images, LVEF 60-65%, no RWMA, RV systolic function normal, pericardial effusion is posterior to LV 11/8 LE Doppler >> negative for DVT  Procedures:  ETT 11/7 >> 11/9 TLC R IJ 11/8 >> 11/12  Transfuse 4 units packed red blood cells.  Antimicrobials:   Previously on azithro/ ceftriaxone, stopped 10/30 Cefepime 11/8 >>  05/06/2020   Micro Data:  SARS 2/ flu 11/7 >> neg MRSA 11/7 >> neg UA 11/7 >> negative, 5 ketones BCx2 11/9 >>      Subjective: Patient laying in bed.  Patient denies any chest pain.  Denies any shortness of breath.  Denies any abdominal pain.  Does not like current dysphagia 1 diet and wishes diet could be advanced however does understand the increased risk of aspiration.   Requesting to be reassessed by speech therapy again today.    Objective: Vitals:   05/08/20 0600 05/08/20 0800 05/08/20 0834 05/08/20 0930  BP: (!) 142/69 (!) 119/100  (!) 145/73  Pulse: 93  93   Resp: (!) 21 (!) 23    Temp:  97.8 F (36.6 C)    TempSrc:  Oral    SpO2: 97%  96%   Weight:      Height:        Intake/Output Summary (Last 24 hours) at 05/08/2020 0942 Last data filed at 05/08/2020 0938 Gross per 24 hour  Intake 1496.13 ml  Output 1900 ml  Net -403.87 ml   Filed Weights   05/06/20 1707 05/07/20 0500 05/08/20 0438  Weight: 96.5 kg 94.1 kg 95.7 kg    Examination:  General exam:  NAD. Off Bipap Respiratory system: Decreased breath sounds in the bases otherwise clear.  No wheezes, no crackles, no rhonchi.  Normal respiratory effort. Cardiovascular system: RRR no murmurs rubs or gallops.  No JVD.  No lower extremity edema. Gastrointestinal system: Abdomen is distended, soft, nontender to palpation, positive bowel sounds.  No rebound.  No guarding.  Rectal tube with loose dark stool noted. Central nervous system: Alert and oriented. No focal neurological deficits. Extremities: Symmetric 5 x 5 power.  1-2+ bilateral lower extremity edema. Skin: No rashes, lesions or ulcers GU: Rectal pouch with?  Melanotic stools Psychiatry: Judgement and insight appear normal. Mood & affect appropriate.     Data Reviewed: I have personally reviewed following labs and imaging studies  CBC: Recent Labs  Lab 05/03/20 0115 05/03/20 0115 05/04/20 0530 05/05/20 0500 05/06/20 0500 05/07/20 0551 05/08/20 0300  WBC 2.4*   < > 3.9* 3.1* 2.9* 2.6* 3.1*  NEUTROABS 1.1*  --   --   --   --  1.8 2.4  HGB 7.4*   < > 9.4* 8.4* 7.9* 7.5* 11.2*  HCT 22.6*   < > 29.2* 26.8* 25.3* 23.8* 34.9*  MCV 89.7   < > 91.8 95.7 95.1 96.0 91.8  PLT 36*   < > 39* 34* 34* 36* 36*   < > = values in this interval not displayed.    Basic Metabolic Panel: Recent Labs  Lab 05/05/20 0500 05/05/20 1615  05/06/20 0500 05/07/20 0551 05/08/20 0300  NA 158* 154* 148* 148* 148*  K 3.5 4.0 3.2* 3.0* 3.6  CL 124* 119* 116* 117* 117*  CO2 24 23 22 22  21*  GLUCOSE 155* 270* 118* 106* 97  BUN 81* 75* 71* 62* 56*  CREATININE 1.67* 1.60* 1.44* 1.48* 1.57*  CALCIUM 7.3* 7.1* 7.0* 7.1* 7.2*  MG  --   --   --  2.5*  --     GFR: Estimated Creatinine Clearance: 45.7 mL/min (A) (by C-G formula based on SCr of 1.57 mg/dL (H)).  Liver Function Tests: Recent Labs  Lab 05/03/20 1131 05/06/20 0500 05/07/20 0551 05/08/20 0300  AST 35 20 19 21   ALT 20 15 14 15   ALKPHOS 57 65 63 69  BILITOT 0.8 0.5 0.5 0.7  PROT 5.7* 5.2* 5.1* 5.3*  ALBUMIN 2.8* 2.7* 2.5* 2.8*    CBG: Recent Labs  Lab 05/07/20 1530 05/07/20 2035 05/07/20 2310 05/08/20 0305 05/08/20 0759  GLUCAP 149* 141* 125* 90 114*     Recent Results (from the past 240 hour(s))  Respiratory Panel by RT PCR (Flu A&B, Covid) - Nasopharyngeal Swab     Status: None   Collection Time: 05/02/2020  1:14 PM   Specimen: Nasopharyngeal Swab  Result Value Ref Range Status   SARS Coronavirus 2 by RT PCR NEGATIVE NEGATIVE Final    Comment: (NOTE) SARS-CoV-2 target nucleic acids are NOT DETECTED.  The SARS-CoV-2 RNA is generally detectable in upper respiratoy specimens during the acute phase of infection. The lowest concentration of SARS-CoV-2 viral copies this assay can detect is 131 copies/mL. A negative result does not preclude SARS-Cov-2 infection and should not be used as the sole basis for treatment or other patient management decisions. A negative result may occur with  improper specimen collection/handling, submission of specimen other than nasopharyngeal swab, presence of viral mutation(s) within the areas targeted by this assay, and inadequate number of viral copies (<131 copies/mL). A negative result must be combined with clinical observations, patient history, and epidemiological information. The  expected result is  Negative.  Fact Sheet for Patients:  PinkCheek.be  Fact Sheet for Healthcare Providers:  GravelBags.it  This test is no t yet approved or cleared by the [place] FDA and  has been authorized for detection and/or diagnosis of SARS-CoV-2 by FDA under an Emergency Use Authorization (EUA). This EUA will remain  in effect (meaning this test can be used) for the duration of the COVID-19 declaration under Section 564(b)(1) of the Act, 21 U.S.C. section 360bbb-3(b)(1), unless the authorization is terminated or revoked sooner.     Influenza A by PCR NEGATIVE NEGATIVE Final   Influenza B by PCR NEGATIVE NEGATIVE Final    Comment: (NOTE) The Xpert Xpress SARS-CoV-2/FLU/RSV assay is intended as an aid in  the diagnosis of influenza from Nasopharyngeal swab specimens and  should not be used as a sole basis for treatment. Nasal washings and  aspirates are unacceptable for Xpert Xpress SARS-CoV-2/FLU/RSV  testing.  Fact Sheet for Patients: PinkCheek.be  Fact Sheet for Healthcare Providers: GravelBags.it  This test is not yet approved or cleared by the [place] FDA and  has been authorized for detection and/or diagnosis of SARS-CoV-2 by  FDA under an Emergency Use Authorization (EUA). This EUA will remain  in effect (meaning this test can be used) for the duration of the  Covid-19 declaration under Section 564(b)(1) of the Act, 21  U.S.C. section 360bbb-3(b)(1), unless the authorization is  terminated or revoked. Performed at [facility], [place] [address], [place], [place] [address]   MRSA PCR Screening     Status: None   Collection Time: 04/28/2020  5:25 PM   Specimen: Nasopharyngeal  Result Value Ref Range Status   MRSA by PCR NEGATIVE NEGATIVE Final    Comment:        The GeneXpert MRSA Assay (FDA approved for NASAL specimens only), is one  component of a comprehensive MRSA colonization surveillance program. It is not intended to diagnose MRSA infection nor to guide or monitor treatment for MRSA infections. Performed at [facility], [place] [address], [place], [place] [address]   Culture, blood (Routine X 2) w Reflex to ID Panel     Status: None   Collection Time: 05/01/20  8:05 AM   Specimen: BLOOD LEFT HAND  Result Value Ref Range Status   Specimen Description   Final    BLOOD LEFT HAND Performed at [place] [address], [place], [place] [address]    Special Requests   Final    BOTTLES DRAWN AEROBIC ONLY Blood Culture adequate volume Performed at [place] [address] [place], [place] [address]    Culture   Final    NO GROWTH 5 DAYS Performed at [facility], [place] [address] [place], [place] [address]    Report Status 05/06/2020 FINAL  Final  Culture, blood (Routine X 2) w Reflex to ID Panel     Status: None   Collection Time: 05/01/20  8:11 AM   Specimen: BLOOD LEFT HAND  Result Value Ref Range Status   Specimen Description   Final    BLOOD LEFT HAND Performed at [place] [address], [place], [place] [address]    Special Requests   Final    BOTTLES DRAWN AEROBIC ONLY Blood Culture results may not be optimal due to an inadequate volume of blood received in culture bottles Performed at [place] [address] [place],  [place] [address]    Culture   Final    NO GROWTH 5 DAYS Performed at [facility], [place] [address], [place], [place] [address]    Report Status 05/06/2020 FINAL  Final  Culture, Urine     Status: None   Collection Time: 05/05/20  7:48 AM   Specimen: Urine, Random  Result Value Ref Range Status   Specimen Description   Final    URINE, RANDOM Performed at [place] [address] [place], [place] [address]    Special Requests    Final    URINE, RANDOM Performed at [place] [address], [place], [place] [address]    Culture   Final    NO GROWTH Performed at [facility], [place] [address], [place], [place] [address]    Report Status 05/06/2020 FINAL  Final         Radiology Studies: DG CHEST PORT 1 VIEW  Result Date: 05/06/2020 CLINICAL DATA:  Increased shortness of breath, hypertension EXAM: PORTABLE CHEST 1 VIEW COMPARISON:  04/30/2020 FINDINGS: Single frontal view of the chest demonstrates interval extubation and removal of the enteric catheter. Right internal jugular catheter tip projects over superior vena cava. The cardiac silhouette is stable. Lung volumes are diminished, without airspace disease, effusion, or pneumothorax. IMPRESSION: 1. Low lung volumes.  No acute process. Electronically Signed   By: Randa Ngo M.D.   On: 05/06/2020 18:51   [place] EKG SITE RITE  Result Date: 05/08/2020 If Site Rite image not attached, placement could not be confirmed due to current cardiac rhythm.       Scheduled Meds: . sodium chloride   Intravenous Once  . sodium chloride   Intravenous Once  . sodium chloride   Intravenous Once  . allopurinol  100 mg Oral BID  . amLODipine  10 mg Per Tube Daily  . bisacodyl  10 mg Rectal Once  . Chlorhexidine Gluconate Cloth  6 each Topical Daily  . collagenase   Topical Daily  . feeding supplement  237 mL Oral TID BM  . insulin aspart  0-15 Units Subcutaneous Q4H  . mouth rinse  15 mL Mouth Rinse BID  . multivitamin with minerals  1 tablet Oral Daily  . pantoprazole (PROTONIX) IV  40 mg Intravenous Q12H  . sodium chloride flush  10-40 mL Intracatheter Q12H   Continuous Infusions: . dextrose 50 mL/hr at 05/08/20 0436  . heparin 1,100 Units/hr (05/08/20 0000)     LOS: 9 days    Time spent: 35 minutes    Irine Seal, MD [facility]   To contact the attending provider between 7A-7P or the covering provider during  after hours 7P-7A, please log into the web site [email or web address] and access using universal [place] password for that web site. If you do not have the password, please call the hospital operator.  05/08/2020, 9:42 AM

## 2020-05-08 NOTE — Progress Notes (Signed)
ANTICOAGULATION CONSULT NOTE - Follow Up Consult  Pharmacy Consult for Heparin Indication: pulmonary embolus  Allergies  Allergen Reactions  . Ivp Dye [Iodinated Diagnostic Agents] Shortness Of Breath    "allergic to CT scan dye", took benadryl with last scan and tolerated well    Patient Measurements: Height: 5\' 4"  (162.6 cm) Weight: 94.1 kg (207 lb 7.3 oz) IBW/kg (Calculated) : 59.2 Heparin Dosing Weight: 81 kg  Vital Signs: Temp: 98.2 F (36.8 C) (11/16 0000) Temp Source: Axillary (11/16 0000) BP: 142/69 (11/16 0000) Pulse Rate: 95 (11/16 0000)  Labs: Recent Labs    05/05/20 0500 05/05/20 1615 05/06/20 0500 05/06/20 0500 05/06/20 0600 05/07/20 0551 05/08/20 0200 05/08/20 0300  HGB   < >  --  7.9*   < >  --  7.5*  --  11.2*  HCT   < >  --  25.3*  --   --  23.8*  --  34.9*  PLT   < >  --  34*  --   --  36*  --  36*  HEPARINUNFRC  --  0.58  --   --  0.51  --  0.42  --   CREATININE   < > 1.60* 1.44*  --   --  1.48*  --  1.57*   < > = values in this interval not displayed.    Estimated Creatinine Clearance: 45.3 mL/min (A) (by C-G formula based on SCr of 1.57 mg/dL (H)).   Medications:  Infusions:  . dextrose 50 mL/hr at 05/08/20 0000  . heparin 1,100 Units/hr (05/08/20 0000)    Assessment: Patient is a 69 y.o M with stage IV mantle cell lymphoma on chemotherapy (got CHOP on 04/21/20), presented to the ED on 11/7 with c/o SOB, groin swelling and emesis.  Chest CT showed acute PE.  Pharmacy is consulted to start heparin drip for VTE.  Significant Events: -Heparin stopped 11/14 PM for melenic stool, FOB + -GI consulted and found stool bag today not melenic and is ok with resuming IV UFH - heparin resumed 11/15 after transfusion completed   Today, 05/08/2020:  HL 0.42 therapeutic  No bleeding per RN  Goal of Therapy:  Heparin level 0.3-0.7 units/ml Monitor platelets by anticoagulation protocol: Yes   Plan:   continue heparin IV infusion at 1100  units/hr  Check confirmatory heparin level in 8 hours  Daily heparin level and CBC  Continue to monitor s/s bleeding.  Dolly Rias RPh 05/08/2020, 3:43 AM

## 2020-05-08 NOTE — Progress Notes (Signed)
ANTICOAGULATION CONSULT NOTE - Follow Up Consult  Pharmacy Consult for Heparin Indication: pulmonary embolus  Allergies  Allergen Reactions  . Ivp Dye [Iodinated Diagnostic Agents] Shortness Of Breath    "allergic to CT scan dye", took benadryl with last scan and tolerated well    Patient Measurements: Height: 5\' 4"  (162.6 cm) Weight: 95.7 kg (210 lb 15.7 oz) IBW/kg (Calculated) : 59.2 Heparin Dosing Weight: 81 kg  Vital Signs: Temp: 97.8 F (36.6 C) (11/16 0800) Temp Source: Oral (11/16 0800) BP: 145/73 (11/16 0930) Pulse Rate: 93 (11/16 0834)  Labs: Recent Labs    05/05/20 1615 05/05/20 1615 05/06/20 0500 05/06/20 0500 05/06/20 0600 05/07/20 0551 05/08/20 0200 05/08/20 0300  HGB  --   --  7.9*   < >  --  7.5*  --  11.2*  HCT  --   --  25.3*  --   --  23.8*  --  34.9*  PLT  --   --  34*  --   --  36*  --  36*  HEPARINUNFRC 0.58  --   --   --  0.51  --  0.42  --   CREATININE 1.60*   < > 1.44*  --   --  1.48*  --  1.57*   < > = values in this interval not displayed.    Estimated Creatinine Clearance: 45.7 mL/min (A) (by C-G formula based on SCr of 1.57 mg/dL (H)).   Medications:  Infusions:  . dextrose 50 mL/hr at 05/08/20 0436  . heparin 1,100 Units/hr (05/08/20 0000)    Assessment: Patient is a 69 y.o M with stage IV mantle cell lymphoma on chemotherapy (got CHOP on 04/21/20), presented to the ED on 11/7 with c/o SOB, groin swelling and emesis.  Chest CT showed acute PE.  Pharmacy is consulted to start heparin drip for VTE.  Significant Events: - Heparin stopped 11/14 PM for melenic stool, FOB + - GI consulted and found stool bag not melenic and is ok with resuming IV UFH.  Heparin resumed 11/15 after transfusion completed  Today, 05/08/2020:  HL 0.58, therapeutic on heparin 1100 units/hr  CBC:  Hgb improved to 11.2, Plt remain low/stable at 36k  No bleeding, complications, or interrupted per RN  Goal of Therapy:  Heparin level 0.3-0.7  units/ml Monitor platelets by anticoagulation protocol: Yes   Plan:   Continue heparin IV infusion at 1100 units/hr  Daily heparin level and CBC  Continue to monitor s/s bleeding.  Gretta Arab PharmD, BCPS Clinical Pharmacist [facility] main pharmacy [phone] 05/08/2020 11:12 AM

## 2020-05-08 NOTE — Telephone Encounter (Addendum)
Oral Oncology Patient Advocate Encounter  Prior Authorization for [employer] has been approved.    PA# [id number] Effective dates: 06/24/19 through 06/22/20  Patients co-pay is [facility] will continue to follow.   [place] [facility] [place] Phone [phone] Fax [phone] 05/08/2020 11:58 AM

## 2020-05-08 NOTE — Progress Notes (Signed)
Was called by RN that patient with complaints of shortness of breath after returning from radiation treatment.  Patient on 15 L high flow nasal cannula per RN. Came and assessed patient at bedside. General: Patient on 15 L high flow nasal cannula with nonrebreather Respirations: Diffuse crackles noted Cardiovascular: Regular rate rhythm no murmurs rubs or gallops GI: Abdomen soft, nontender, distended, positive bowel sounds.  No rebound.  No guarding. Extremities: No clubbing no cyanosis 2+ bilateral lower extremity edema  Assessment/plan 1.  Dyspnea secondary to volume overload Likely secondary to volume overload.  Patient received 2 units packed red blood cell transfusion 05/07/2020.  Patient did receive IV Lasix with blood transfusions.  Patient volume overloaded on examination.  Check a chest x-ray.  Lasix 40 mg IV x2 doses.  Strict I's and O's.  Daily weights.  Follow.

## 2020-05-08 NOTE — Progress Notes (Signed)
  Speech Language Pathology Treatment: Dysphagia  Patient Details Name: Dean Dixon MRN: [id number] DOB: Jan 07, 1951 Today's Date: 05/08/2020 Time: 7340-3709 SLP Time Calculation (min) (ACUTE ONLY): 25 min  Assessment / Plan / Recommendation Clinical Impression  Pt seen at bedside to assess readiness to advance diet from puree and honey thick liquids. Pt was awake and alert, verbalizing frustration about not being given water. SLP educated pt re: results of MBS last week, as well as rationale for conservative diet. Pt accepted trials of nectar thick liquid, thin liquid, and solid textures. Immediate cough response was elicited following thin liquid. Pt appeared to tolerate small sips of nectar thick liquid and solid texture, however, he is profoundly weak, and would benefit from continued diet modification for energy conservation. Pt may also benefit from repeat MBS to objectively assess tolerance of advanced textures, given respiratory concerns. Will advance diet to dys 2 with nectar thick liquids and follow closely for assessment of diet tolerance and continued education.    HPI HPI: Dean Dixon is a 69 y.o. male with medical history significant of mantle cell lymphoma on CHOP. Presenting with dyspnea and ab distention. Reports 2 weeks of shortness of breath that has worsened in the last 2 days. ETT 11/7-11/9.       SLP Plan  Continue with current plan of care; Repeat MBS?       Recommendations  Diet recommendations: Dysphagia 2 (fine chop);Nectar-thick liquid Liquids provided via: Cup;Straw Medication Administration: Whole meds with puree Supervision: Patient able to self feed Compensations: Slow rate;Small sips/bites;Other (Comment);Minimize environmental distractions Postural Changes and/or Swallow Maneuvers: Seated upright 90 degrees;Upright 30-60 min after meal                Oral Care Recommendations: Oral care QID Follow up Recommendations: Other (comment) (TBD) SLP  Visit Diagnosis: Dysphagia, oropharyngeal phase (R13.12) Plan: Continue with current plan of care;MBS       GO               Dean Dixon, [facility] [place] Speech Language Pathologist Office: [phone] Pager: [phone]  Shonna Chock 05/08/2020, 2:35 PM

## 2020-05-08 NOTE — Progress Notes (Addendum)
HEMATOLOGY-ONCOLOGY PROGRESS NOTE  SUBJECTIVE:  More alert this morning.  Discussed with nursing and he has some intermittent confusion.  The patient states that he wants to leave the hospital and go to the beach.  Denies chest pain and shortness of breath this morning.  Abdominal distention is about the same.  Denies nausea and vomiting.  No bleeding noted.  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)   06/29/2019 - 10/20/2019 Chemotherapy   The patient had dexamethasone (DECADRON) 4 MG tablet, 8 mg, Oral, Daily, 1 of 1 cycle, Start date: 06/20/2019, End date: 11/17/2019 palonosetron (ALOXI) injection 0.25 mg, 0.25 mg, Intravenous,  Once, 5 of 5 cycles Administration: 0.25 mg (07/27/2019), 0.25 mg (06/30/2019), 0.25 mg (08/24/2019), 0.25 mg (10/20/2019), 0.25 mg (09/21/2019) pegfilgrastim (NEULASTA ONPRO KIT) injection 6 mg, 6 mg, Subcutaneous, Once, 3 of 3 cycles Administration: 6 mg (08/25/2019), 6 mg (09/22/2019) bendamustine (BENDEKA) 200 mg in sodium chloride 0.9 % 50 mL (3.4483 mg/mL) chemo infusion, 90 mg/m2 = 200 mg, Intravenous,  Once, 5 of 5 cycles Administration: 200 mg (06/29/2019), 200 mg (06/30/2019), 200 mg (07/27/2019), 200 mg (07/28/2019), 200 mg (08/24/2019), 200 mg (08/25/2019), 200 mg (09/21/2019), 200 mg (09/22/2019) riTUXimab-pvvr (RUXIENCE) 800 mg in sodium chloride 0.9 % 250 mL (2.4242 mg/mL) infusion, 375 mg/m2 = 800 mg, Intravenous,  Once, 5 of 5 cycles Dose modification: 375 mg/m2 (original dose 375 mg/m2, Cycle 2), 400 mg (original dose 375 mg/m2, Cycle 2, Reason: Other (see comments), Comment: finishing rituximab from previous day, infusion not completed), 300 mg (original dose 300 mg, Cycle 4) Administration: 800 mg (06/29/2019), 800 mg (07/27/2019), 800 mg (08/24/2019), 400 mg (07/28/2019), 800 mg (10/20/2019), 800 mg (09/21/2019), 300 mg (09/22/2019)  for chemotherapy treatment.    04/20/2020 -  Chemotherapy    The patient had DOXOrubicin (ADRIAMYCIN) chemo injection 100 mg, 48 mg/m2 = 104 mg, Intravenous,  Once, 1 of 8 cycles Administration: 100 mg (04/21/2020) palonosetron (ALOXI) injection 0.25 mg, 0.25 mg, Intravenous,  Once, 1 of 8 cycles Administration: 0.25 mg (04/21/2020) pegfilgrastim-cbqv (UDENYCA) injection 6 mg, 6 mg, Subcutaneous, Once, 1 of 1 cycle Administration: 6 mg (04/24/2020) vinCRIStine (ONCOVIN) 2 mg in sodium chloride 0.9 % 50 mL chemo infusion, 2 mg, Intravenous,  Once, 1 of 8 cycles Administration: 2 mg (04/21/2020) cyclophosphamide (CYTOXAN) 1,560 mg in sodium chloride 0.9 % 250 mL chemo infusion, 750 mg/m2 = 1,560 mg, Intravenous,  Once, 1 of 8 cycles Administration: 1,560 mg (04/21/2020) fosaprepitant (EMEND) 150 mg in sodium chloride 0.9 % 145 mL IVPB, 150 mg, Intravenous,  Once, 1 of 8 cycles Administration: 150 mg (04/21/2020)  for chemotherapy treatment.       REVIEW OF SYSTEMS:   Negative except as noted in the HPI.  PHYSICAL EXAMINATION: ECOG PERFORMANCE STATUS: 3 - Symptomatic, >50% confined to bed  Vitals:   05/08/20 0800 05/08/20 0834  BP: (!) 119/100   Pulse:  93  Resp: (!) 23   Temp: 97.8 F (36.6 C)   SpO2:  96%   Filed Weights   05/06/20 1707 05/07/20 0500 05/08/20 0438  Weight: 96.5 kg 94.1 kg 95.7 kg    Intake/Output from previous day: 11/15 0701 - 11/16 0700 In: 1425.1 [I.V.:885.1; Blood:540] Out: 1700 [Urine:1400; Stool:300]  GENERAL: Awake, alert, no distress SKIN: skin color, texture, turgor are normal, no rashes or significant lesions EYES: normal, Conjunctiva are pink and non-injected, sclera clear LUNGS: Diminished HEART: regular   rate & rhythm and no murmurs, 2+ bilateral lower extremity edema ABDOMEN: Significant abdominal distention NEURO: Alert and oriented x3.  Has intermittent periods of confusion per nursing.  LABORATORY DATA:  I have reviewed the data as listed CMP Latest Ref Rng & Units 05/08/2020 05/07/2020  05/06/2020  Glucose 70 - 99 mg/dL 97 106(H) 118(H)  BUN 8 - 23 mg/dL 56(H) 62(H) 71(H)  Creatinine 0.61 - 1.24 mg/dL 1.57(H) 1.48(H) 1.44(H)  Sodium 135 - 145 mmol/L 148(H) 148(H) 148(H)  Potassium 3.5 - 5.1 mmol/L 3.6 3.0(L) 3.2(L)  Chloride 98 - 111 mmol/L 117(H) 117(H) 116(H)  CO2 22 - 32 mmol/L 21(L) 22 22  Calcium 8.9 - 10.3 mg/dL 7.2(L) 7.1(L) 7.0(L)  Total Protein 6.5 - 8.1 g/dL 5.3(L) 5.1(L) 5.2(L)  Total Bilirubin 0.3 - 1.2 mg/dL 0.7 0.5 0.5  Alkaline Phos 38 - 126 U/L 69 63 65  AST 15 - 41 U/L _0 ALT 0 - 44 U/L _1 . CBC Latest Ref Rng & Units 05/08/2020 05/07/2020 05/06/2020  WBC 4.0 - 10.5 K/uL 3.1(L) 2.6(L) 2.9(L)  Hemoglobin 13.0 - 17.0 g/dL 11.2(L) 7.5(L) 7.9(L)  Hematocrit 39 - 52 % 34.9(L) 23.8(L) 25.3(L)  Platelets 150 - 400 K/uL 36(L) 36(L) 34(L)    Lab Results  Component Value Date   WBC 3.1 (L) 05/08/2020   HGB 11.2 (L) 05/08/2020   HCT 34.9 (L) 05/08/2020   MCV 91.8 05/08/2020   PLT 36 (L) 05/08/2020   NEUTROABS 2.4 05/08/2020    CT ABDOMEN PELVIS WO CONTRAST  Addendum Date: 04/19/2020   ADDENDUM REPORT: 04/19/2020 12:54 ADDENDUM: The peritoneal fluid within the pelvis is not enhancing as this is a non IV contrast imaging exam. Therefore would described fluid collection as having thickened rim. Favor peritoneal metastasis. Findings conveyed toSCOTT GOLDSTON on 04/19/2020  at12:54. Electronically Signed   By: Suzy Bouchard M.D.   On: 04/19/2020 12:54   Result Date: 04/19/2020 CLINICAL DATA:  Abdominal pain.  Tachycardia.  Mantle cell lymphoma EXAM: CT ABDOMEN AND PELVIS WITHOUT CONTRAST TECHNIQUE: Multidetector CT imaging of the abdomen and pelvis was performed following the standard protocol without IV contrast. COMPARISON:  PET-CT scan 01/02/2020 FINDINGS: Lower chest: New small RIGHT effusion. No pericardial effusion. There is new precordial adenopathy anterior to the RIGHT heart and liver (image 9/2). Hepatobiliary: New intraperitoneal  free fluid along the margin of the RIGHT hepatic lobe. This fluid is simple attenuation. The liver appears unchanged on noncontrast exam. Large cyst the LEFT hepatic lobe. Gallbladder normal. Pancreas: Pancreas is grossly normal noncontrast exam Spleen: . spleen is unchanged Adrenals/urinary tract: Adrenal glands kidneys and ureters normal. Bladder Stomach/Bowel: Stomach duodenum normal. There is a new mass lesion in the LEFT upper quadrant measuring 14.2 by 13.0 cm. The mass may be associated the small bowel or the small bowel mesentery. The descending colon is also involving the mass (image 43/2. There is extensive fluid within the leaves of the mesentery of the distal small bowel. Appendix normal. Ascending transverse colon normal. The descending colon tracks along the new LEFT upper quadrant mass. Vascular/Lymphatic: New large LEFT upper quadrant mass either represents lymphoma involving the small bowel or mesentery. Mass is very large described the stomach section. Additionally there is new periportal adenopathy with enlarged lymph nodes along the aorta measuring up to 2 cm (image 44/2. There is nodularity along the greater omentum abdominal on the LEFT. Reproductive: Prostate unremarkable Other: Small volume free fluid the pelvis. There is enhancing rim  to the fluid (image 73/2 which could indicate peritonitis. Musculoskeletal: No aggressive osseous lesion. IMPRESSION: 1. Dominant finding is new large (15 cm) lymphoid mass within LEFT upper quadrant. Mass is new from PET-CT 01/02/2020. Differential includes massive mesenteric adenopathy versus lymphoma of the small bowel or descending colon. No oral or IV contrast. 2. New periaortic retroperitoneal lymphadenopathy. New precordial adenopathy. 3. Extensive fluid within the mesentery and omental thickening in LEFT lower quadrant. 4. Free fluid the pelvis with thin enhancing rim suggest peritonitis. 5. No evidence of bowel perforation. No evidence of bowel  obstruction. Electronically Signed: By: Stewart  Edmunds M.D. On: 04/19/2020 12:39   DG Chest 1 View  Result Date: 05/14/2020 CLINICAL DATA:  Elevated blood pressure. EXAM: CHEST  1 VIEW COMPARISON:  April 29, 2020 FINDINGS: The lung volumes are low. There are small bilateral pleural effusions with adjacent atelectasis. The heart size is mildly enlarged. The lung volumes are low. There is no pneumothorax. IMPRESSION: 1. Low lung volumes. 2. Small bilateral pleural effusions with adjacent atelectasis. Electronically Signed   By: Christopher  Green M.D.   On: 05/13/2020 20:42   DG Chest 2 View  Result Date: 05/02/2020 CLINICAL DATA:  Shortness of breath lasting several days. Groin swelling and redness. EXAM: CHEST - 2 VIEW COMPARISON:  Chest CT 04/19/20 FINDINGS: Stable cardiomediastinal contours. Low lung volumes. Unchanged small bilateral pleural effusions. No airspace densities. IMPRESSION: Low lung volumes with persistent small bilateral pleural effusions. Electronically Signed   By: Taylor  Stroud M.D.   On: 05/21/2020 05:09   DG Abd 1 View  Result Date: 05/17/2020 CLINICAL DATA:  Tube placement EXAM: ABDOMEN - 1 VIEW COMPARISON:  April 29, 2020 FINDINGS: The enteric tube projects over the gastric antrum/pylorus. The tube is pointed distally. The visualized bowel gas pattern is unremarkable. IMPRESSION: The enteric tube projects over the gastric antrum/pylorus. Electronically Signed   By: Christopher  Green M.D.   On: 05/04/2020 22:05   CT CHEST WO CONTRAST  Result Date: 04/19/2020 CLINICAL DATA:  Pneumonia. Pleural effusion. Concern for metastases. EXAM: CT CHEST WITHOUT CONTRAST TECHNIQUE: Multidetector CT imaging of the chest was performed following the standard protocol without IV contrast. COMPARISON:  CT dated Nov 14, 2019 FINDINGS: Cardiovascular: The heart size is stable. Coronary artery calcifications are noted. There is no significant pericardial effusion. Mediastinum/Nodes: -- No  mediastinal lymphadenopathy. -- No hilar lymphadenopathy. --there is left axillary adenopathy. This has progressed since the prior study. There is mild right axillary adenopathy, progressed from prior study. -- No supraclavicular lymphadenopathy. -- Normal thyroid gland where visualized. -there is mild diffuse esophageal wall thickening. Lungs/Pleura: There are trace to small bilateral pleural effusions, right greater than left. There is atelectasis at the right lung base. There is no pneumothorax. No area of consolidation concerning for pneumonia. Upper Abdomen: There is a partially visualized mass in the left upper quadrant, better visualized on the patient's prior CT. There is free fluid in the upper abdomen. Multiple enlarged pericardiophrenic lymph nodes are noted. There appears to be retroperitoneal adenopathy. Musculoskeletal: No chest wall abnormality. No bony spinal canal stenosis. IMPRESSION: 1. Trace to small bilateral pleural effusions, right greater than left. There is atelectasis at the right lung base. No area of consolidation concerning for pneumonia. 2. Mild diffuse esophageal wall thickening. Correlation with patient's symptoms is recommended. 3. Worsening axillary adenopathy. 4. Partially visualized mass in the left upper quadrant, better visualized on the patient's prior CT. 5. Small volume ascites. Aortic Atherosclerosis (ICD10-I70.0). Electronically Signed   By:   Constance Holster M.D.   On: 04/19/2020 20:37   CT Angio Chest PE W and/or Wo Contrast  Result Date: 05/04/2020 CLINICAL DATA:  69 year old male with history of shortness of breath for the past few days. Abdominal distension. History of mantle cell lymphoma. EXAM: CT ANGIOGRAPHY CHEST CT ABDOMEN AND PELVIS WITH CONTRAST TECHNIQUE: Multidetector CT imaging of the chest was performed using the standard protocol during bolus administration of intravenous contrast. Multiplanar CT image reconstructions and MIPs were obtained to evaluate  the vascular anatomy. Multidetector CT imaging of the abdomen and pelvis was performed using the standard protocol during bolus administration of intravenous contrast. CONTRAST:  164m OMNIPAQUE IOHEXOL 350 MG/ML SOLN COMPARISON:  CT the abdomen and pelvis 04/19/2020. CT the chest 04/19/2020. FINDINGS: CTA CHEST FINDINGS Cardiovascular: Study is limited by patient respiratory motion. However, despite this limitation there are segmental sized filling defects within pulmonary arteries to the left upper and lower lobes best appreciated on axial images 73 and 106 of series 5, and axial image 148 of series 5 respectively, compatible with pulmonary embolism. Heart size is normal. There is no significant pericardial fluid, thickening or pericardial calcification. Aortic atherosclerosis. No definite coronary artery calcifications. Mediastinum/Nodes: Multiple enlarged anterior mediastinal lymph nodes noted inferiorly, immediately above the right hemidiaphragm, measuring up to 2.3 cm in short axis (axial image 13 of series 11), increased compared to the prior study. Prominent but nonenlarged distal paraesophageal lymph node measuring 8 mm in short axis, nonspecific. Circumferential thickening of the distal third of the esophagus. Mildly enlarged left axillary lymph nodes measuring up to 1.3 cm in short axis. Lungs/Pleura: Collapse of the trachea and mainstem bronchi during expiration, indicative of tracheobronchomalacia. No acute consolidative airspace disease. Small bilateral pleural effusions lying dependently. No definite suspicious appearing pulmonary nodules or masses are noted. Musculoskeletal: There are no aggressive appearing lytic or blastic lesions noted in the visualized portions of the skeleton. Review of the MIP images confirms the above findings. CT ABDOMEN and PELVIS FINDINGS Hepatobiliary: Multiple well-defined low-attenuation lesions are again noted throughout the liver, similar in size, number and  distribution to the prior study, largest of which are compatible with simple cysts measuring up to 5.3 x 3.7 cm in segment 2 and for a (axial image 18 of series 11). Smaller lesions are too small to definitively characterize, but also favored to represent small cysts or biliary hamartomas. No new suspicious hepatic lesions. No intra or extrahepatic biliary ductal dilatation. 6 mm calcified gallstone lying dependently in the gallbladder. Gallbladder is not distended. Pancreas: No pancreatic mass. No pancreatic ductal dilatation. No pancreatic or peripancreatic fluid collections or inflammatory changes. Spleen: Unremarkable. Adrenals/Urinary Tract: 1.2 cm low-attenuation lesion in the upper pole of the right kidney and exophytic 2.4 cm low-attenuation lesion in the upper pole of the left kidney, compatible with simple cysts. Other subcentimeter low-attenuation lesions in both kidneys, too small to definitively characterize, but favored to represent tiny cysts. No hydroureteronephrosis. Urinary bladder is nearly decompressed, but otherwise unremarkable in appearance. Stomach/Bowel: Stomach is unremarkable in appearance. No pathologic dilatation of small bowel or colon. Multiple bowel loops in the left upper quadrant are intimately associated with the large soft tissue mass (discussed below). Colonic diverticulosis, particularly in the sigmoid colon, without definitive evidence to suggest an acute diverticulitis at this time. Vascular/Lymphatic: Aortic atherosclerosis, without evidence of aneurysm or dissection in the abdominal or pelvic vasculature. Extensive lymphadenopathy noted in the abdomen, most evident in the retroperitoneum where there are bulky para-aortic nodal masses measuring  up to 2.3 x 4.1 cm near the left renal artery (axial image 41 of series 11), 3.2 x 4.6 cm adjacent to the infrarenal abdominal aorta (axial image 48 of series 11), and 3.4 x 3.2 cm adjacent to the aortic bifurcation (axial image 54 of  series 11), all of which appear similar to the recent prior study. Bulky celiac axis and gastrohepatic ligament lymph nodes are noted, measuring up to 1.9 cm in short axis (axial image 24 of series 11). Several prominent but nonenlarged pelvic lymph nodes are noted (nonspecific). Reproductive: Prostate gland and seminal vesicles are unremarkable in appearance. Other: Bulky poorly defined soft tissue mass in the left upper quadrant of the abdomen which is intimately associated with multiple adjacent bowel loops, and difficult to distinctly defined, but estimated to measure approximately 15.5 x 13.5 x 13.4 cm (axial image 38 of series 11 and coronal image 53 of series 14). Moderate volume of ascites. Diffuse enhancement of the peritoneal membranes, indicative of widespread peritoneal metastatic disease. Extensive omental caking, best appreciated in the left side of the abdomen where the bulkiest portion of this measures approximately 14.9 x 4.0 cm (axial image 47 of series 11). No pneumoperitoneum. Musculoskeletal: There are no aggressive appearing lytic or blastic lesions noted in the visualized portions of the skeleton. Review of the MIP images confirms the above findings. IMPRESSION: 1. Study is positive for segmental sized pulmonary emboli in the left lung. These appear nonocclusive at this time, and there is no associated pulmonary hemorrhage to suggest frank pulmonary infarct. 2. Bulky soft tissue mass in the left upper quadrant with extensive lymphadenopathy in the chest and abdomen, and evidence of widespread intraperitoneal metastatic disease, as detailed above. 3. Small bilateral pleural effusions lying dependently. 4. Aortic atherosclerosis. 5. Tracheobronchomalacia. 6. Cholelithiasis. 7. Colonic diverticulosis. Electronically Signed   By: Daniel  Entrikin M.D.   On: 05/10/2020 12:50   CT Abdomen Pelvis W Contrast  Result Date: 05/03/2020 CLINICAL DATA:  68-year-old male with history of shortness of  breath for the past few days. Abdominal distension. History of mantle cell lymphoma. EXAM: CT ANGIOGRAPHY CHEST CT ABDOMEN AND PELVIS WITH CONTRAST TECHNIQUE: Multidetector CT imaging of the chest was performed using the standard protocol during bolus administration of intravenous contrast. Multiplanar CT image reconstructions and MIPs were obtained to evaluate the vascular anatomy. Multidetector CT imaging of the abdomen and pelvis was performed using the standard protocol during bolus administration of intravenous contrast. CONTRAST:  100mL OMNIPAQUE IOHEXOL 350 MG/ML SOLN COMPARISON:  CT the abdomen and pelvis 04/19/2020. CT the chest 04/19/2020. FINDINGS: CTA CHEST FINDINGS Cardiovascular: Study is limited by patient respiratory motion. However, despite this limitation there are segmental sized filling defects within pulmonary arteries to the left upper and lower lobes best appreciated on axial images 73 and 106 of series 5, and axial image 148 of series 5 respectively, compatible with pulmonary embolism. Heart size is normal. There is no significant pericardial fluid, thickening or pericardial calcification. Aortic atherosclerosis. No definite coronary artery calcifications. Mediastinum/Nodes: Multiple enlarged anterior mediastinal lymph nodes noted inferiorly, immediately above the right hemidiaphragm, measuring up to 2.3 cm in short axis (axial image 13 of series 11), increased compared to the prior study. Prominent but nonenlarged distal paraesophageal lymph node measuring 8 mm in short axis, nonspecific. Circumferential thickening of the distal third of the esophagus. Mildly enlarged left axillary lymph nodes measuring up to 1.3 cm in short axis. Lungs/Pleura: Collapse of the trachea and mainstem bronchi during expiration, indicative of tracheobronchomalacia.   No acute consolidative airspace disease. Small bilateral pleural effusions lying dependently. No definite suspicious appearing pulmonary nodules or  masses are noted. Musculoskeletal: There are no aggressive appearing lytic or blastic lesions noted in the visualized portions of the skeleton. Review of the MIP images confirms the above findings. CT ABDOMEN and PELVIS FINDINGS Hepatobiliary: Multiple well-defined low-attenuation lesions are again noted throughout the liver, similar in size, number and distribution to the prior study, largest of which are compatible with simple cysts measuring up to 5.3 x 3.7 cm in segment 2 and for a (axial image 18 of series 11). Smaller lesions are too small to definitively characterize, but also favored to represent small cysts or biliary hamartomas. No new suspicious hepatic lesions. No intra or extrahepatic biliary ductal dilatation. 6 mm calcified gallstone lying dependently in the gallbladder. Gallbladder is not distended. Pancreas: No pancreatic mass. No pancreatic ductal dilatation. No pancreatic or peripancreatic fluid collections or inflammatory changes. Spleen: Unremarkable. Adrenals/Urinary Tract: 1.2 cm low-attenuation lesion in the upper pole of the right kidney and exophytic 2.4 cm low-attenuation lesion in the upper pole of the left kidney, compatible with simple cysts. Other subcentimeter low-attenuation lesions in both kidneys, too small to definitively characterize, but favored to represent tiny cysts. No hydroureteronephrosis. Urinary bladder is nearly decompressed, but otherwise unremarkable in appearance. Stomach/Bowel: Stomach is unremarkable in appearance. No pathologic dilatation of small bowel or colon. Multiple bowel loops in the left upper quadrant are intimately associated with the large soft tissue mass (discussed below). Colonic diverticulosis, particularly in the sigmoid colon, without definitive evidence to suggest an acute diverticulitis at this time. Vascular/Lymphatic: Aortic atherosclerosis, without evidence of aneurysm or dissection in the abdominal or pelvic vasculature. Extensive  lymphadenopathy noted in the abdomen, most evident in the retroperitoneum where there are bulky para-aortic nodal masses measuring up to 2.3 x 4.1 cm near the left renal artery (axial image 41 of series 11), 3.2 x 4.6 cm adjacent to the infrarenal abdominal aorta (axial image 48 of series 11), and 3.4 x 3.2 cm adjacent to the aortic bifurcation (axial image 54 of series 11), all of which appear similar to the recent prior study. Bulky celiac axis and gastrohepatic ligament lymph nodes are noted, measuring up to 1.9 cm in short axis (axial image 24 of series 11). Several prominent but nonenlarged pelvic lymph nodes are noted (nonspecific). Reproductive: Prostate gland and seminal vesicles are unremarkable in appearance. Other: Bulky poorly defined soft tissue mass in the left upper quadrant of the abdomen which is intimately associated with multiple adjacent bowel loops, and difficult to distinctly defined, but estimated to measure approximately 15.5 x 13.5 x 13.4 cm (axial image 38 of series 11 and coronal image 53 of series 14). Moderate volume of ascites. Diffuse enhancement of the peritoneal membranes, indicative of widespread peritoneal metastatic disease. Extensive omental caking, best appreciated in the left side of the abdomen where the bulkiest portion of this measures approximately 14.9 x 4.0 cm (axial image 47 of series 11). No pneumoperitoneum. Musculoskeletal: There are no aggressive appearing lytic or blastic lesions noted in the visualized portions of the skeleton. Review of the MIP images confirms the above findings. IMPRESSION: 1. Study is positive for segmental sized pulmonary emboli in the left lung. These appear nonocclusive at this time, and there is no associated pulmonary hemorrhage to suggest frank pulmonary infarct. 2. Bulky soft tissue mass in the left upper quadrant with extensive lymphadenopathy in the chest and abdomen, and evidence of widespread intraperitoneal metastatic disease,   as  detailed above. 3. Small bilateral pleural effusions lying dependently. 4. Aortic atherosclerosis. 5. Tracheobronchomalacia. 6. Cholelithiasis. 7. Colonic diverticulosis. Electronically Signed   By: Daniel  Entrikin M.D.   On: 04/28/2020 12:50   DG CHEST PORT 1 VIEW  Result Date: 05/06/2020 CLINICAL DATA:  Increased shortness of breath, hypertension EXAM: PORTABLE CHEST 1 VIEW COMPARISON:  04/30/2020 FINDINGS: Single frontal view of the chest demonstrates interval extubation and removal of the enteric catheter. Right internal jugular catheter tip projects over superior vena cava. The cardiac silhouette is stable. Lung volumes are diminished, without airspace disease, effusion, or pneumothorax. IMPRESSION: 1. Low lung volumes.  No acute process. Electronically Signed   By: Michael  Brown M.D.   On: 05/06/2020 18:51   DG CHEST PORT 1 VIEW  Result Date: 04/30/2020 CLINICAL DATA:  Central line placement EXAM: PORTABLE CHEST 1 VIEW COMPARISON:  Radiograph 05/10/2020 FINDINGS: Endotracheal tube tip terminates 2.2 cm from the expected location of the carina. Transesophageal tube tip and side port distal to the GE junction. Right IJ catheter tip terminates in the right atrium. Telemetry leads overlie the chest. Layering bilateral effusions adjacent passive atelectasis. Additional bandlike subsegmental atelectasis in the lungs as well. Stable borderline cardiomegaly accounting for portable technique. No acute osseous or soft tissue abnormality. Degenerative changes are present in the imaged spine and shoulders. IMPRESSION: 1. Endotracheal tube tip terminates 2.2 cm from the expected location of the carina. Could consider retraction 2 cm to the mid trachea. 2. Transesophageal tube tip and side port distal to the GE junction. 3. Right IJ catheter tip terminates in the right atrium. 4. Layering bilateral effusions with adjacent passive atelectasis and vascular congestion. 5. Stable borderline cardiomegaly.  Electronically Signed   By: Price  DeHay M.D.   On: 04/30/2020 01:14   DG CHEST PORT 1 VIEW  Result Date: 04/28/2020 CLINICAL DATA:  Endotracheal tube placement EXAM: PORTABLE CHEST 1 VIEW COMPARISON:  April 29, 2020 FINDINGS: The endotracheal tube terminates above the carina. The lung volumes are low. Again identified are bilateral pleural effusions with adjacent atelectasis. The heart size is stable. There is no pneumothorax. The enteric tube terminates below the left hemidiaphragm. IMPRESSION: Lines and tubes as above.  Otherwise, stable exam. Electronically Signed   By: Christopher  Green M.D.   On: 05/05/2020 22:04   DG Chest Portable 1 View  Result Date: 04/19/2020 CLINICAL DATA:  Cough, wheezing EXAM: PORTABLE CHEST 1 VIEW COMPARISON:  01/09/2020 FINDINGS: Low lung volumes. Minimal atelectasis or scarring at the left lung base. No pleural effusion or pneumothorax. Heart size is within normal limits for technique. IMPRESSION: Minimal atelectasis or scarring at the left lung base. Electronically Signed   By: Praneil  Patel M.D.   On: 04/19/2020 12:18   DG Abd Portable 2 Views  Result Date: 05/21/2020 CLINICAL DATA:  Abdominal distension and pain. EXAM: PORTABLE ABDOMEN - 2 VIEW COMPARISON:  04/19/2020 FINDINGS: There is mild gaseous distension of the stomach. No dilated loops of large or small bowel. There is no evidence of free air. No radio-opaque calculi or other significant radiographic abnormality is seen. IMPRESSION: Nonobstructive bowel gas pattern. Electronically Signed   By: Taylor  Stroud M.D.   On: 05/02/2020 06:12   DG Swallowing Func-Speech Pathology  Result Date: 05/04/2020 Objective Swallowing Evaluation: Type of Study: MBS-Modified Barium Swallow Study  Patient Details Name: Lynx Soo MRN: [id number] Date of Birth: 12/08/1950 Today's Date: 05/04/2020 Time: SLP Start Time (ACUTE ONLY): 0854 -SLP Stop Time (ACUTE ONLY): 0910 SLP Time   Calculation (min) (ACUTE ONLY): 16 min  Past Medical History: Past Medical History: Diagnosis Date . Cancer ([place])   Mantle Cell Lymphoma . Diabetes mellitus without complication ([place])  . Hypertension  Past Surgical History: Past Surgical History: Procedure Laterality Date . APPENDECTOMY   . TONSILLECTOMY   HPI: Chai Routh is a 69 y.o. male with medical history significant of mantle cell lymphoma on CHOP. Presenting with dyspnea and ab distention. Reports 2 weeks of shortness of breath that has worsened in the last 2 days. ETT 11/7-11/9.  Subjective: alert at bedside Assessment / Plan / Recommendation CHL IP CLINICAL IMPRESSIONS 05/04/2020 Clinical Impression Pt demonstrated oropharyngeal dysphagia with aspiration of nectar thick barium marked by delayed initiation and incompetent laryngeal closure. Barium aggregated in his valleculae and pyriform sinuses for approximately 4 seconds filling pyriforms and spilling to vocal cords prior to laryngeal closure. Subsequent trial nectar was aspirated with cough not clearing trachea. He coughed intermittently throughout without new penetrates/aspirates suspect from previous intrusion. Mild delay with honey thick with mild pharyngeal residue. Inconsistent suboptimal movement of hyoid anteriorally and superiorally. Pt clearly fatigued at onset of and during study with increased respirations and decreased endurance. Compensatory strategies not attempted given level of current ability to perform from medical standpoint. Mild lingual residue cleared with independent swallows. Recommend he initiate Dys 1 (puree), honey thick liquid cautiously with pills whole in puree, support for self feeding and rest breaks. Recommendations may be modified if there are changes to plan of care in terms of Palliative.          SLP Visit Diagnosis Dysphagia, oropharyngeal phase (R13.12) Attention and concentration deficit following -- Frontal lobe and executive function deficit following -- Impact on safety and function Moderate  aspiration risk   CHL IP TREATMENT RECOMMENDATION 05/04/2020 Treatment Recommendations Therapy as outlined in treatment plan below   Prognosis 05/04/2020 Prognosis for Safe Diet Advancement Good Barriers to Reach Goals (No Data) Barriers/Prognosis Comment -- CHL IP DIET RECOMMENDATION 05/04/2020 SLP Diet Recommendations Honey thick liquids;Dysphagia 1 (Puree) solids Liquid Administration via Cup;No straw Medication Administration Whole meds with puree Compensations Slow rate;Small sips/bites;Other (Comment) Postural Changes Seated upright at 90 degrees   CHL IP OTHER RECOMMENDATIONS 05/04/2020 Recommended Consults -- Oral Care Recommendations Oral care BID Other Recommendations Order thickener from pharmacy   CHL IP FOLLOW UP RECOMMENDATIONS 05/04/2020 Follow up Recommendations Other (comment)   CHL IP FREQUENCY AND DURATION 05/04/2020 Speech Therapy Frequency (ACUTE ONLY) min 2x/week Treatment Duration 2 weeks      CHL IP ORAL PHASE 05/04/2020 Oral Phase Impaired Oral - Pudding Teaspoon -- Oral - Pudding Cup -- Oral - Honey Teaspoon -- Oral - Honey Cup Lingual/palatal residue Oral - Nectar Teaspoon -- Oral - Nectar Cup Lingual/palatal residue Oral - Nectar Straw -- Oral - Thin Teaspoon -- Oral - Thin Cup -- Oral - Thin Straw -- Oral - Puree -- Oral - Mech Soft -- Oral - Regular Lingual/palatal residue Oral - Multi-Consistency -- Oral - Pill -- Oral Phase - Comment --  CHL IP PHARYNGEAL PHASE 05/04/2020 Pharyngeal Phase Impaired Pharyngeal- Pudding Teaspoon -- Pharyngeal -- Pharyngeal- Pudding Cup -- Pharyngeal -- Pharyngeal- Honey Teaspoon -- Pharyngeal -- Pharyngeal- Honey Cup Delayed swallow initiation-vallecula;Pharyngeal residue - pyriform;Pharyngeal residue - valleculae Pharyngeal -- Pharyngeal- Nectar Teaspoon -- Pharyngeal -- Pharyngeal- Nectar Cup Delayed swallow initiation-pyriform sinuses;Penetration/Aspiration during swallow;Pharyngeal residue - pyriform Pharyngeal Material enters airway, remains ABOVE  vocal cords and not ejected out;Material enters airway, passes BELOW cords and not ejected out despite  cough attempt by patient Pharyngeal- Nectar Straw -- Pharyngeal -- Pharyngeal- Thin Teaspoon -- Pharyngeal -- Pharyngeal- Thin Cup -- Pharyngeal -- Pharyngeal- Thin Straw -- Pharyngeal -- Pharyngeal- Puree -- Pharyngeal -- Pharyngeal- Mechanical Soft -- Pharyngeal -- Pharyngeal- Regular Pharyngeal residue - posterior pharnyx Pharyngeal -- Pharyngeal- Multi-consistency -- Pharyngeal -- Pharyngeal- Pill -- Pharyngeal -- Pharyngeal Comment --  CHL IP CERVICAL ESOPHAGEAL PHASE 05/04/2020 Cervical Esophageal Phase WFL Pudding Teaspoon -- Pudding Cup -- Honey Teaspoon -- Honey Cup -- Nectar Teaspoon -- Nectar Cup -- Nectar Straw -- Thin Teaspoon -- Thin Cup -- Thin Straw -- Puree -- Mechanical Soft -- Regular -- Multi-consistency -- Pill -- Cervical Esophageal Comment -- [place] Siren 05/04/2020, 10:56 AM Orbie Pyo Colvin Caroli.Ed Actor Pager [phone] Office [phone]              ECHOCARDIOGRAM COMPLETE  Result Date: 04/20/2020    ECHOCARDIOGRAM REPORT   Patient Name:   AMADO ANDAL Date of Exam: 04/20/2020 Medical Rec #:  [id number]    Height:       64.0 in Accession #:    [id number]   Weight:       210.1 lb Date of Birth:  Jun 15, 1951   BSA:          1.998 m Patient Age:    48 years     BP:           156/83 mmHg Patient Gender: M            HR:           88 bpm. Exam Location:  Inpatient Procedure: 2D Echo, Cardiac Doppler and Color Doppler Indications:    Chemo evaluation  History:        Patient has prior history of Echocardiogram examinations, most                 recent 11/14/2019. COPD; Risk Factors:Hypertension and Diabetes.                 Mantle cell lymphoma.  Sonographer:    Dustin Flock Referring Phys: Stephens  1. Left ventricular ejection fraction, by estimation, is 65 to 70%. The left ventricle has normal function. The left ventricle has  no regional wall motion abnormalities. Left ventricular diastolic parameters are consistent with Grade I diastolic dysfunction (impaired relaxation).  2. Right ventricular systolic function is normal. The right ventricular size is normal. Tricuspid regurgitation signal is inadequate for assessing PA pressure.  3. The mitral valve is grossly normal. Trivial mitral valve regurgitation. No evidence of mitral stenosis.  4. The aortic valve is tricuspid. There is mild calcification of the aortic valve. There is mild thickening of the aortic valve. Aortic valve regurgitation is not visualized. Mild aortic valve sclerosis is present, with no evidence of aortic valve stenosis.  5. The inferior vena cava is normal in size with greater than 50% respiratory variability, suggesting right atrial pressure of 3 mmHg. Comparison(s): Changes from prior study are noted. EF is now 65-70%. FINDINGS  Left Ventricle: Left ventricular ejection fraction, by estimation, is 65 to 70%. The left ventricle has normal function. The left ventricle has no regional wall motion abnormalities. The left ventricular internal cavity size was normal in size. There is  no left ventricular hypertrophy. Left ventricular diastolic parameters are consistent with Grade I diastolic dysfunction (impaired relaxation). Normal left ventricular filling pressure. Right Ventricle: The right ventricular size is normal. No increase in right ventricular wall  thickness. Right ventricular systolic function is normal. Tricuspid regurgitation signal is inadequate for assessing PA pressure. Left Atrium: Left atrial size was normal in size. Right Atrium: Right atrial size was normal in size. Pericardium: Trivial pericardial effusion is present. Presence of pericardial fat pad. Mitral Valve: The mitral valve is grossly normal. Trivial mitral valve regurgitation. No evidence of mitral valve stenosis. Tricuspid Valve: The tricuspid valve is grossly normal. Tricuspid valve  regurgitation is not demonstrated. No evidence of tricuspid stenosis. Aortic Valve: The aortic valve is tricuspid. There is mild calcification of the aortic valve. There is mild thickening of the aortic valve. Aortic valve regurgitation is not visualized. Mild aortic valve sclerosis is present, with no evidence of aortic valve stenosis. Aortic valve mean gradient measures 11.1 mmHg. Aortic valve peak gradient measures 16.7 mmHg. Aortic valve area, by VTI measures 2.42 cm. Pulmonic Valve: The pulmonic valve was grossly normal. Pulmonic valve regurgitation is not visualized. No evidence of pulmonic stenosis. Aorta: The aortic root is normal in size and structure. Venous: The right upper pulmonary vein is normal. The inferior vena cava is normal in size with greater than 50% respiratory variability, suggesting right atrial pressure of 3 mmHg. IAS/Shunts: The atrial septum is grossly normal. Additional Comments: There is a small pleural effusion in the left lateral region.  LEFT VENTRICLE PLAX 2D LVIDd:         4.30 cm  Diastology LVIDs:         2.60 cm  LV e' medial:    8.81 cm/s LV PW:         1.10 cm  LV E/e' medial:  7.2 LV IVS:        1.10 cm  LV e' lateral:   7.51 cm/s LVOT diam:     2.30 cm  LV E/e' lateral: 8.4 LV SV:         92 LV SV Index:   46 LVOT Area:     4.15 cm  RIGHT VENTRICLE RV Basal diam:  3.20 cm RV S prime:     11.00 cm/s TAPSE (M-mode): 2.9 cm LEFT ATRIUM             Index       RIGHT ATRIUM           Index LA diam:        3.30 cm 1.65 cm/m  RA Area:     15.30 cm LA Vol (A2C):   43.2 ml 21.62 ml/m RA Volume:   37.60 ml  18.82 ml/m LA Vol (A4C):   30.7 ml 15.37 ml/m LA Biplane Vol: 36.9 ml 18.47 ml/m  AORTIC VALVE AV Area (Vmax):    2.42 cm AV Area (Vmean):   2.14 cm AV Area (VTI):     2.42 cm AV Vmax:           204.20 cm/s AV Vmean:          159.222 cm/s AV VTI:            0.381 m AV Peak Grad:      16.7 mmHg AV Mean Grad:      11.1 mmHg LVOT Vmax:         119.00 cm/s LVOT Vmean:         82.200 cm/s LVOT VTI:          0.222 m LVOT/AV VTI ratio: 0.58  AORTA Ao Root diam: 3.10 cm MITRAL VALVE MV Area (PHT): 4.06 cm     SHUNTS MV  Decel Time: 187 msec     Systemic VTI:  0.22 m MV E velocity: 63.40 cm/s   Systemic Diam: 2.30 cm MV A velocity: 104.00 cm/s MV E/A ratio:  0.61 Fulton O'Neal MD Electronically signed by  O'Neal MD Signature Date/Time: 04/20/2020/10:32:25 AM    Final    US ASCITES (ABDOMEN LIMITED)  Result Date: 04/19/2020 CLINICAL DATA:  68-year-old male with abdominal distension, ascites EXAM: LIMITED ABDOMEN ULTRASOUND FOR ASCITES TECHNIQUE: Limited ultrasound survey for ascites was performed in all four abdominal quadrants. COMPARISON:  CT abdomen/pelvis 04/19/2020 FINDINGS: Sonographic interrogation of the abdomen was performed in anticipation of paracentesis. Unfortunately, the volume is a ascites is quite small and insufficient for drainage. There is however extensive omental caking and peritoneal nodularity. IMPRESSION: 1. Small volume ascites, insufficient for paracentesis. 2. Extensive omental caking and peritoneal nodularity consistent with peritoneal carcinomatosis. Electronically Signed   By: Heath  McCullough M.D.   On: 04/19/2020 16:53   VAS US LOWER EXTREMITY VENOUS (DVT)  Result Date: 05/01/2020  Lower Venous DVT Study Indications: Pain, Swelling, and Edema.  Risk Factors: Immobility confirmed PE. Performing Technologist: Vernon Matacale RCT RDMS  Examination Guidelines: A complete evaluation includes B-mode imaging, spectral Doppler, color Doppler, and power Doppler as needed of all accessible portions of each vessel. Bilateral testing is considered an integral part of a complete examination. Limited examinations for reoccurring indications may be performed as noted. The reflux portion of the exam is performed with the patient in reverse Trendelenburg.  +---------+---------------+---------+-----------+----------+--------------+ RIGHT     CompressibilityPhasicitySpontaneityPropertiesThrombus Aging +---------+---------------+---------+-----------+----------+--------------+ CFV      Full           Yes      Yes                                 +---------+---------------+---------+-----------+----------+--------------+ SFJ      Full                                                        +---------+---------------+---------+-----------+----------+--------------+ FV Prox  Full                                                        +---------+---------------+---------+-----------+----------+--------------+ FV Mid   Full                                                        +---------+---------------+---------+-----------+----------+--------------+ FV DistalFull                                                        +---------+---------------+---------+-----------+----------+--------------+ PFV      Full                                                        +---------+---------------+---------+-----------+----------+--------------+   POP      Full           Yes      Yes                                 +---------+---------------+---------+-----------+----------+--------------+ PTV      Full                                                        +---------+---------------+---------+-----------+----------+--------------+ PERO     Full                                                        +---------+---------------+---------+-----------+----------+--------------+   +---------+---------------+---------+-----------+----------+--------------+ LEFT     CompressibilityPhasicitySpontaneityPropertiesThrombus Aging +---------+---------------+---------+-----------+----------+--------------+ CFV      Full           Yes      Yes                                 +---------+---------------+---------+-----------+----------+--------------+ SFJ      Full                                                         +---------+---------------+---------+-----------+----------+--------------+ FV Prox  Full                                                        +---------+---------------+---------+-----------+----------+--------------+ FV Mid   Full                                                        +---------+---------------+---------+-----------+----------+--------------+ FV DistalFull                                                        +---------+---------------+---------+-----------+----------+--------------+ PFV      Full                                                        +---------+---------------+---------+-----------+----------+--------------+ POP      Full           Yes      Yes                                 +---------+---------------+---------+-----------+----------+--------------+  PTV      Full                                                        +---------+---------------+---------+-----------+----------+--------------+ PERO     Full                                                        +---------+---------------+---------+-----------+----------+--------------+     Summary: RIGHT: - There is no evidence of deep vein thrombosis in the lower extremity.  - No cystic structure found in the popliteal fossa.  LEFT: - There is no evidence of deep vein thrombosis in the lower extremity.  - No cystic structure found in the popliteal fossa.  *See table(s) above for measurements and observations. Electronically signed by Todd Early MD on 05/01/2020 at 2:50:45 PM.    Final    ECHOCARDIOGRAM LIMITED  Result Date: 04/30/2020    ECHOCARDIOGRAM LIMITED REPORT   Patient Name:   Kian Naill Date of Exam: 04/30/2020 Medical Rec #:  [id number]    Height:       64.0 in Accession #:    [id number]   Weight:       205.0 lb Date of Birth:  06/29/1950   BSA:          1.977 m Patient Age:    68 years     BP:           114/60 mmHg Patient Gender: M             HR:           109 bpm. Exam Location:  Inpatient Procedure: Limited Echo, Limited Color Doppler and Cardiac Doppler Indications:    Pulmonary Embolus I26.99  History:        Patient has prior history of Echocardiogram examinations, most                 recent 04/20/2020. Mantle cell lymphoma; Risk                 Factors:Hypertension and Diabetes.  Sonographer:    Casey Kirkpatrick RDCS (AE) Referring Phys: [id number] TYRONE A KYLE  Sonographer Comments: Echo performed with patient supine and on artificial respirator. IMPRESSIONS  1. Cystic structure noted in liver on Subcostal images.  2. Left ventricular ejection fraction, by estimation, is 60 to 65%. The left ventricle has normal function. The left ventricle has no regional wall motion abnormalities.  3. Right ventricular systolic function is normal. The right ventricular size is normal.  4. The pericardial effusion is posterior to the left ventricle.  5. The mitral valve is normal in structure. No evidence of mitral valve regurgitation. No evidence of mitral stenosis.  6. The aortic valve is normal in structure. Aortic valve regurgitation is not visualized. No aortic stenosis is present.  7. The inferior vena cava is normal in size with greater than 50% respiratory variability, suggesting right atrial pressure of 3 mmHg. FINDINGS  Left Ventricle: Left ventricular ejection fraction, by estimation, is 60 to 65%. The left ventricle has normal function. The left ventricle has no regional wall motion abnormalities. The left ventricular internal cavity size   was normal in size. There is  no left ventricular hypertrophy. Right Ventricle: The right ventricular size is normal. No increase in right ventricular wall thickness. Right ventricular systolic function is normal. Left Atrium: Left atrial size was normal in size. Right Atrium: Right atrial size was normal in size. Pericardium: Trivial pericardial effusion is present. The pericardial effusion is posterior to the left  ventricle. Mitral Valve: The mitral valve is normal in structure. No evidence of mitral valve stenosis. Tricuspid Valve: The tricuspid valve is normal in structure. Tricuspid valve regurgitation is mild . No evidence of tricuspid stenosis. Aortic Valve: The aortic valve is normal in structure. Aortic valve regurgitation is not visualized. No aortic stenosis is present. Pulmonic Valve: The pulmonic valve was normal in structure. Pulmonic valve regurgitation is trivial. No evidence of pulmonic stenosis. Aorta: The aortic root is normal in size and structure. Venous: The inferior vena cava is normal in size with greater than 50% respiratory variability, suggesting right atrial pressure of 3 mmHg. IAS/Shunts: No atrial level shunt detected by color flow Doppler. Additional Comments: Cystic structure noted in liver on Subcostal images. LEFT VENTRICLE PLAX 2D LVIDd:         4.20 cm LVIDs:         2.60 cm LV PW:         1.00 cm LV IVS:        1.10 cm LVOT diam:     2.30 cm LVOT Area:     4.15 cm  RIGHT VENTRICLE RV S prime:     30.80 cm/s TAPSE (M-mode): 1.8 cm LEFT ATRIUM         Index LA diam:    3.00 cm 1.52 cm/m   AORTA Ao Root diam: 3.40 cm  SHUNTS Systemic Diam: 2.30 cm Jenkins Rouge MD Electronically signed by Jenkins Rouge MD Signature Date/Time: 04/30/2020/9:18:44 AM    Final    [place] EKG SITE RITE  Result Date: 05/03/2020 If Site Rite image not attached, placement could not be confirmed due to current cardiac rhythm.  [place] EKG SITE RITE  Result Date: 04/19/2020 If Site Rite image not attached, placement could not be confirmed due to current cardiac rhythm.   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 CD5+ clonal lymphoproliferative disorder" -04/26/2019 FISH revealed "Positive for p53 (17p13) deletion." -05/31/2019 PET/CT ([id number]) revealed "1.  There are multiple prominent lymph nodes in the neck, both axilla, the retroperitoneum and pelvis with low level hypermetabolic activity consistent with chronic lymphocytic leukemia. Deauville 3 and 4. 2. Mild splenomegaly and mild generalized splenic hypermetabolic activity 3. No evidence of bone marrow involvement." -06/10/2019 Flow Pathology Report ([id number]) revealed "Abnormal B-cell population." -06/10/2019 Surgical Pathology Report ([id number])  revealed "Atypical lymphoid proliferation consistent with mantle cell lymphoma." -09/19/19 PET Skull Base to Thigh ([id number])-- favorable response to current treatment -01/02/2020 PET/CT ([id number]) revealed "1. Continued further decrease in size and FDG accumulation in index cervical, axillary, retroperitoneal, and pelvic lymph nodes. No lymphadenopathy by size criteria today. FDG accumulation in these lymph nodes is compatible with Deauville 2 category. 2. Interval decrease in size with resolution of hypermetabolism associated with the spleen previously."  -04/19/2020 CT abdomen pelvis revealed "1. Dominant finding is new large (15 cm) lymphoid mass within LEFT upper quadrant. Mass is new from PET-CT 01/02/2020. Differential includes massive mesenteric adenopathy versus lymphoma of the small bowel or descending  colon. No oral or IV contrast. 2. New periaortic retroperitoneal lymphadenopathy. New precordial Adenopathy. 3. Extensive fluid within the mesentery and omental thickening in LEFT lower quadrant. 4. Free fluid the pelvis with thin enhancing rim suggest peritonitis. 5. No evidence of bowel perforation. No evidence of bowel Obstruction." -05/04/2020 CTA chest and CT abdomen pelvis revealed "1. Study is positive for segmental sized pulmonary emboli in the left lung. These appear nonocclusive at this time, and there is no associated pulmonary hemorrhage to suggest frank pulmonary infarct. 2. Bulky soft tissue mass in the left upper quadrant with  extensive lymphadenopathy in the chest and abdomen, and evidence of widespread intraperitoneal metastatic disease, as detailed above. 3. Small bilateral pleural effusions lying dependently. 4. Aortic atherosclerosis. 5. Tracheobronchomalacia. 6. Cholelithiasis. 7. Colonic diverticulosis."  2) Rigors from Rituxan - needing demerol as premedication. Rituxan rate to be capped at 150mg/hour  3) Thrombocytopenia from chemotherapy  4) Neutropenia due to recent chemotherapy  5) anemia  6) significant new abdominal distention due to lymphoma recurrence  7) new hypercalcemia- ?  Related to dehydration and  Lymphoma -resolved at this time  8) acute hypoxic respiratory failure-multifactorial due to abdominal distention, PE, and hypertensive emergency  9) left segmental PE  10) AKI  11) tumor lysis syndrome  12) pancytopenia related to chemotherapy and possible sepsis.  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  He received 1 cycle of CHOP with no improvement in his left upper quadrant soft tissue mass and abdominal distention at this time. -Undergoing radiation to his left upper quadrant soft tissue mass. -Plan to begin acalabrutinib with clinical status improves and his counts improve.  Continuing to hold off at this time. -The patient has received G-CSF already and recommend close monitoring of his CBC with differential.  - Transfuse for hemoglobin less than 8 and platelets less than 30,000 or active bleeding. -GI evaluation has been completed.  Anticoagulation was resumed. Continue heparin for now. When platelet count >50,000, can transition to Lovenox 1 mg/kg BID.  -Received rasburicase on 05/01/2020 and 05/03/2020 for hyperuricemia. Uric acid level has normalized. Continue allopurinol. -Monitor renal function closely and consider nephrology consult if worsening.   -Recommend PT/OT consult.   LOS: 9 days   Kristin Curcio   

## 2020-05-08 NOTE — Progress Notes (Signed)
Peripherally Inserted Central Catheter Placement  The IV Nurse has discussed with the patient and/or persons authorized to consent for the patient, the purpose of this procedure and the potential benefits and risks involved with this procedure.  The benefits include less needle sticks, lab draws from the catheter, and the patient may be discharged home with the catheter. Risks include, but not limited to, infection, bleeding, blood clot (thrombus formation), and puncture of an artery; nerve damage and irregular heartbeat and possibility to perform a PICC exchange if needed/ordered by physician.  Alternatives to this procedure were also discussed.  Bard Power PICC patient education guide, fact sheet on infection prevention and patient information card has been provided to patient /or left at bedside.    PICC Placement Documentation  PICC Double Lumen 06/24/70 PICC Right Basilic 40 cm 0 cm (Active)  Indication for Insertion or Continuance of Line Prolonged intravenous therapies 05/08/20 1553  Exposed Catheter (cm) 0 cm 05/08/20 1553  Site Assessment Clean;Dry;Intact 05/08/20 1553  Lumen #1 Status Flushed;Saline locked;Blood return noted 05/08/20 1553  Lumen #2 Status Flushed;Saline locked;Blood return noted 05/08/20 1553  Dressing Type Transparent 05/08/20 1553  Dressing Status Clean;Dry;Intact 05/08/20 1553  Antimicrobial disc in place? Yes 05/08/20 1553  Safety Lock Not Applicable 53/66/44 0347  Line Care Connections checked and tightened 05/08/20 1553  Line Adjustment (NICU/IV Team Only) No 05/08/20 1553  Dressing Intervention New dressing 05/08/20 1553  Dressing Change Due 05/15/20 05/08/20 Robertson, Nicolette Bang 05/08/2020, 3:54 PM

## 2020-05-08 NOTE — Progress Notes (Signed)
PT Cancellation Note  Patient Details Name: Dean Dixon MRN: [id number] DOB: 03/30/1951   Cancelled Treatment:     Patient wasn't seen by PT today due to sterile procedure PICC line placement when therapist arrived to unit. Unable to return today. Will attempt to see another day as schedule permits.   Juel Burrow, [facility] [phone]

## 2020-05-08 NOTE — Progress Notes (Signed)
Occupational Therapy Treatment Patient Details Name: Dean Dixon MRN: [id number] DOB: 06-06-1951 Today's Date: 05/08/2020    History of present illness 69 year old male with stage IV mantle cell lymphoma presenting 05/14/2020 with N/V and progressive SOB found to have acute left segmental PE. Developed hypertension 250/100 with respiratory distress with chest pain, unable to tolerate BiPAP and was intubated, Extubated 11/9.   OT comments  Patient with increased activity tolerance this session, able to sit up to EOB with min A from OT for trunk support. Patient demonstrate impulsivity requiring mod cues to redirect not to stand until OT ready as well as for hand placement not to pull up on walker. Attempted twice to stand from EOB with 1x assist with patient able to lift buttock but unable to extend at hips. Patient with improved insight to deficits at end of session after seeing he cannot stand yet "I want to go home but I know I can't yet now."    Follow Up Recommendations  SNF    Equipment Recommendations  Other (comment) (defer to next venue)       Precautions / Restrictions Precautions Precautions: Fall Precaution Comments: incontinence, flexiseal       Mobility Bed Mobility Overal bed mobility: Needs Assistance Bed Mobility: Supine to Sit;Sit to Supine     Supine to sit: Min assist;HOB elevated Sit to supine: Mod assist   General bed mobility comments: with HOB elevated and cues for hand placement patient able to mobilize to EOB with min A for trunk support. does require increased time for transitiong from from head of bed. patient able to control trunk into bed, requiring mod A to safely elevate legs   Transfers                 General transfer comment: attempted twice to stand from EOB with 1x assist. patient require mod cues to redirect due to impulsivity attempting to stand before OT ready and for hand placement as patient attempting to pull up on walker. patient  able to clear buttock on both attempts but unable to extend hips    Balance Overall balance assessment: Needs assistance Sitting-balance support: Feet supported Sitting balance-Leahy Scale: Fair         Standing balance comment: unable with RW and 1x assist                           ADL either performed or assessed with clinical judgement   ADL Overall ADL's : Needs assistance/impaired     Grooming: Wash/dry face;Supervision/safety;Sitting               Lower Body Dressing: Total assistance;Bed level Lower Body Dressing Details (indicate cue type and reason): to don socks   Toilet Transfer Details (indicate cue type and reason): attempted to stand twice with x1 assist, unable Toileting- Clothing Manipulation and Hygiene: Total assistance;Bed level Toileting - Clothing Manipulation Details (indicate cue type and reason): minor leaking from flexiseal       General ADL Comments: patient demonstrating improved activity tolerance and overall strength to come to sitting EOB with 1 assist however still with LE weakness limited ability to stand this session with 1 assist. patient also impulsive requiring mod cues to redirect.                Cognition Arousal/Alertness: Awake/alert Behavior During Therapy: Impulsive Overall Cognitive Status: No family/caregiver present to determine baseline cognitive functioning  General Comments: patient oriented to month, year unsure of exact date. patient initially displaying insight to deficits wanting to leave hospital however after attempting to stand with OT and unable patient verbalizes understanding he isn't ready to go home yet.               General Comments VSS on room air    Pertinent Vitals/ Pain       Pain Assessment: Faces Faces Pain Scale: No hurt         Frequency  Min 2X/week        Progress Toward Goals  OT Goals(current goals can now be found in  the care plan section)  Progress towards OT goals: Progressing toward goals  Acute Rehab OT Goals Patient Stated Goal: go home OT Goal Formulation: With patient Time For Goal Achievement: 05/17/20 Potential to Achieve Goals: Good ADL Goals Pt Will Perform Eating: with set-up;sitting Pt Will Perform Grooming: with set-up;sitting Pt Will Perform Upper Body Bathing: with set-up;sitting Pt Will Perform Lower Body Bathing: with min assist;with set-up;sit to/from stand Pt Will Perform Upper Body Dressing: with set-up;with supervision;sitting Pt Will Perform Lower Body Dressing: with min assist;sit to/from stand;with set-up Pt Will Transfer to Toilet: with min assist;stand pivot transfer;bedside commode Pt Will Perform Toileting - Clothing Manipulation and hygiene: with min assist;sit to/from stand Pt/caregiver will Perform Home Exercise Program: Right Upper extremity;Left upper extremity;Increased strength;With theraband Additional ADL Goal #1: Patient will perform 10 min functional activity or exercise activity as evidence of improving activity tolerance  Plan Discharge plan remains appropriate       AM-PAC OT "6 Clicks" Daily Activity     Outcome Measure   Help from another person eating meals?: A Little Help from another person taking care of personal grooming?: A Little Help from another person toileting, which includes using toliet, bedpan, or urinal?: Total Help from another person bathing (including washing, rinsing, drying)?: A Lot Help from another person to put on and taking off regular upper body clothing?: A Little Help from another person to put on and taking off regular lower body clothing?: Total 6 Click Score: 13    End of Session Equipment Utilized During Treatment: Rolling walker  OT Visit Diagnosis: Muscle weakness (generalized) (M62.81)   Activity Tolerance Patient tolerated treatment well   Patient Left in bed;with call bell/phone within reach   Nurse  Communication Mobility status        Time: 8099-8338 OT Time Calculation (min): 24 min  Charges: OT General Charges $OT Visit: 1 Visit OT Treatments $Self Care/Home Management : 23-37 mins  Delbert Phenix OT OT pager: [phone]   Rosemary Holms 05/08/2020, 12:59 PM

## 2020-05-09 ENCOUNTER — Ambulatory Visit
Admit: 2020-05-09 | Discharge: 2020-05-09 | Disposition: A | Discharge status: Home / Self Care | Payer: Medicare Other | Attending: Radiation Oncology | Admitting: Radiation Oncology

## 2020-05-09 DIAGNOSIS — C8318 Mantle cell lymphoma, lymph nodes of multiple sites: Secondary | ICD-10-CM | POA: Diagnosis not present

## 2020-05-09 DIAGNOSIS — R0602 Shortness of breath: Secondary | ICD-10-CM | POA: Diagnosis not present

## 2020-05-09 LAB — CBC WITH DIFFERENTIAL/PLATELET
Abs Immature Granulocytes: 0.09 10*3/uL — ABNORMAL HIGH (ref 0.00–0.07)
Basophils Absolute: 0 10*3/uL (ref 0.0–0.1)
Basophils Relative: 1 %
Eosinophils Absolute: 0 10*3/uL (ref 0.0–0.5)
Eosinophils Relative: 0 %
HCT: 32.8 % — ABNORMAL LOW (ref 39.0–52.0)
Hemoglobin: 10.6 g/dL — ABNORMAL LOW (ref 13.0–17.0)
Immature Granulocytes: 3 %
Lymphocytes Relative: 11 %
Lymphs Abs: 0.3 10*3/uL — ABNORMAL LOW (ref 0.7–4.0)
MCH: 29.8 pg (ref 26.0–34.0)
MCHC: 32.3 g/dL (ref 30.0–36.0)
MCV: 92.1 fL (ref 80.0–100.0)
Monocytes Absolute: 0.2 10*3/uL (ref 0.1–1.0)
Monocytes Relative: 8 %
Neutro Abs: 2.4 10*3/uL (ref 1.7–7.7)
Neutrophils Relative %: 77 %
Platelets: 39 10*3/uL — ABNORMAL LOW (ref 150–400)
RBC: 3.56 MIL/uL — ABNORMAL LOW (ref 4.22–5.81)
RDW: 17.8 % — ABNORMAL HIGH (ref 11.5–15.5)
WBC: 3.1 10*3/uL — ABNORMAL LOW (ref 4.0–10.5)
nRBC: 0 % (ref 0.0–0.2)

## 2020-05-09 LAB — COMPREHENSIVE METABOLIC PANEL
ALT: 14 U/L (ref 0–44)
AST: 18 U/L (ref 15–41)
Albumin: 2.7 g/dL — ABNORMAL LOW (ref 3.5–5.0)
Alkaline Phosphatase: 67 U/L (ref 38–126)
Anion gap: 9 (ref 5–15)
BUN: 52 mg/dL — ABNORMAL HIGH (ref 8–23)
CO2: 21 mmol/L — ABNORMAL LOW (ref 22–32)
Calcium: 7.3 mg/dL — ABNORMAL LOW (ref 8.9–10.3)
Chloride: 116 mmol/L — ABNORMAL HIGH (ref 98–111)
Creatinine, Ser: 1.68 mg/dL — ABNORMAL HIGH (ref 0.61–1.24)
GFR, Estimated: 43 mL/min — ABNORMAL LOW (ref >60)
Glucose, Bld: 74 mg/dL (ref 70–99)
Potassium: 3 mmol/L — ABNORMAL LOW (ref 3.5–5.1)
Sodium: 146 mmol/L — ABNORMAL HIGH (ref 135–145)
Total Bilirubin: 0.6 mg/dL (ref 0.3–1.2)
Total Protein: 5.3 g/dL — ABNORMAL LOW (ref 6.5–8.1)

## 2020-05-09 LAB — URIC ACID: Uric Acid, Serum: 2.4 mg/dL — ABNORMAL LOW (ref 3.7–8.6)

## 2020-05-09 LAB — GLUCOSE, CAPILLARY
Glucose-Capillary: 96 mg/dL (ref 70–99)
Glucose-Capillary: 154 mg/dL — ABNORMAL HIGH (ref 70–99)
Glucose-Capillary: 121 mg/dL — ABNORMAL HIGH (ref 70–99)
Glucose-Capillary: 143 mg/dL — ABNORMAL HIGH (ref 70–99)
Glucose-Capillary: 153 mg/dL — ABNORMAL HIGH (ref 70–99)

## 2020-05-09 LAB — HEPARIN LEVEL (UNFRACTIONATED): Heparin Unfractionated: 0.55 IU/mL (ref 0.30–0.70)

## 2020-05-09 MED ORDER — FUROSEMIDE 10 MG/ML IJ SOLN
40.0000 mg | Freq: Every day | INTRAMUSCULAR | Status: DC
  Administered 2020-05-09: 40 mg via INTRAVENOUS
  Filled 2020-05-09: qty 4

## 2020-05-09 MED ORDER — POTASSIUM CHLORIDE CRYS ER 20 MEQ PO TBCR
40.0000 meq | EXTENDED_RELEASE_TABLET | ORAL | Status: AC
  Administered 2020-05-09: 40 meq via ORAL
  Filled 2020-05-09 (×2): qty 2

## 2020-05-09 MED ORDER — DEXTROSE 5 % IV SOLN: INTRAVENOUS | Status: AC

## 2020-05-09 MED ORDER — HYDROXYZINE HCL 25 MG PO TABS
25.0000 mg | ORAL_TABLET | Freq: Three times a day (TID) | ORAL | Status: DC | PRN
  Administered 2020-05-09 (×2): 25 mg via ORAL
  Filled 2020-05-09 (×3): qty 1

## 2020-05-09 NOTE — Progress Notes (Signed)
PROGRESS NOTE    Dean Dixon  NFA:[id number] DOB: 09-Jan-1951 DOA: 05/11/2020 PCP: Leonides Sake, MD   Chief Complaint  Patient presents with   Groin Swelling   Shortness of Breath   Emesis    Brief Narrative:   69 year old male with stage IV mantle cell lymphoma on CHOP presenting with N/V and progressive SOB over the last 2 weeks found to have acute left segmental PE. Admitted to [facility] however developed hypertension 250/100 with respiratory distress with chest pain, unable to tolerate BiPAP and was intubated.  He's been transferred back to [facility]  See previous progress notes and H&P for additional details regarding history.   Significant events 11/07 Admitted to [facility]/ decompensated/ intubated 11/08 Weaned on PSV, lasix with 2.2L UOP  11/09 Extubated  11/11 Fever / leukocytosis improved, on abx  11/12 Mental status improved, on RA, palliative radiation   Assessment & Plan:   Active Problems:   Mantle cell lymphoma (HCC)   Acute on chronic respiratory failure with hypoxia (HCC)   Pulmonary embolism (HCC)   Shortness of breath   Dyspnea and respiratory abnormalities   Intubation of airway performed without difficulty   Pressure injury of skin   Abdominal distension   Tumor lysis syndrome   Antineoplastic chemotherapy induced pancytopenia (HCC)   Tumor lysis syndrome following antineoplastic drug therapy   Neutropenia with fever (HCC)   Acute renal failure (HCC)   Hypernatremia   SOB (shortness of breath)  1 acute hypoxemic respiratory failure   Segmental Left Sided Pulmonary Embolism   Volume overload  Extubated on 11/9 He's on NRB this AM, but seems like this is for comfort as per nursing this was per his request and was on RA this AM - wean as tolerated Continue heparin for PE - per oncology, continue heparin until platelets >50,000, then can transition to lovenox 1 mg/kg BID (transfuse platelets for <30,000 and Hb <8) Strict I/O, daily weights SOB 11/16 with  concern for volume overload in setting of pRBC transfusion on 11/15 -> s/p lasix - will dose lasix again today and follow volume status/renal function - UA without proteinuria, normal LFT's, albumin 2.7 Echo 11/9 with EF 60-65%, no RWMA (see report)  # metastatic mantle cell lymphoma on chemotherapy   Pancytopenia   Tumor Lysis Syndrome Appreciate oncology assistance - Plan to begin acalabrutinib after clinical improvement Receiving radiation to LUQ soft tissue mass S/p 1 cycle of CHOP S/p G-CSF Transfuse for hemoglobin <8 and platelets <30,000  --- s/p 4 units pRBC during hospitalization  S/p rasburicase for hyperuricemia, continue allopurinol   2.  Acute renal failure Baseline creatinine is around 1 Creatinine peaked at 3.1 Improved to 1.68 today, fluctuating, increasing slightly with lasix yesterday No hydroureteronephrosis on imaging from 05/02/2020 Follow with intermittent lasix   3.  Hypernatremia Continue D5W at 50 cc/h.  LR normal saline discontinued.  Follow.    4.  Acute left segmental PE In the setting of metastatic mantle cell lymphoma Lower extremity Dopplers negative for DVT 2D echo with no right ventricular strain Patient assessed by GI and felt patient did not have Dean Dixon melanotic stools all GI bleed and as such heparin resumed 05/07/2020 Hematology following, appreciate assistance, continue lovenox until platelets >50,000 then transition to lovenox  5.  Fever/leukopenia  Blood cx 11/9 NGTD Urine cx NG CXR without acute infiltrates Covid negative, flu negative S/p IV abx  7.  Concern for melanotic stools ruled out Concern for GI bleed.  Patient  was assessed by GI and was felt patient did not have melanotic stools and as such anticoagulation resumed.  Change IV PPI to once daily.  See GI note from 11/15.  10.  Hypokalemia Replace and follow   11.  Prognosis Patient with metastatic mantle cell lymphoma with large soft tissue mass in the left upper quadrant of  the abdomen associated with multiple bowel loops. Patient with extensive omental caking.  Patient mantle cell lymphoma resistant to chemotherapy.  Patient with tumor lysis syndrome.  Patient now with PE. Patient with aspiration and was on Dean Dixon dysphagia 1 diet.  Patient with Dean Dixon poor prognosis.  Patient being followed by palliative care.  Oncology following.  Appreciate assistance.  DVT prophylaxis: heparin Code Status: full  Family Communication: none at bedside Disposition:   Status is: Inpatient  Remains inpatient appropriate because:Inpatient level of care appropriate due to severity of illness   Dispo: The patient is from: Home              Anticipated d/c is to: pending CIR? SNF?              Anticipated d/c date is: > 3 days              Patient currently is not medically stable to d/c.   Consultants:   GI  Palliative  Oncology  PCCM  Procedures: Echo IMPRESSIONS    1. Cystic structure noted in liver on Subcostal images.  2. Left ventricular ejection fraction, by estimation, is 60 to 65%. The  left ventricle has normal function. The left ventricle has no regional  wall motion abnormalities.  3. Right ventricular systolic function is normal. The right ventricular  size is normal.  4. The pericardial effusion is posterior to the left ventricle.  5. The mitral valve is normal in structure. No evidence of mitral valve  regurgitation. No evidence of mitral stenosis.  6. The aortic valve is normal in structure. Aortic valve regurgitation is  not visualized. No aortic stenosis is present.  7. The inferior vena cava is normal in size with greater than 50%  respiratory variability, suggesting right atrial pressure of 3 mmHg.   LE [place] Summary:  RIGHT:  - There is no evidence of deep vein thrombosis in the lower extremity.    - No cystic structure found in the popliteal fossa.    LEFT:  - There is no evidence of deep vein thrombosis in the lower extremity.    -  No cystic structure found in the popliteal fossa.  Antimicrobials:  Anti-infectives (From admission, onward)   Start     Dose/Rate Route Frequency Ordered Stop   05/03/20 1400  ceFEPIme (MAXIPIME) 2 g in sodium chloride 0.9 % 100 mL IVPB        2 g 200 mL/hr over 30 Minutes Intravenous Every 12 hours 05/03/20 1340 05/06/20 1437   05/02/20 2200  ceFEPIme (MAXIPIME) 2 g in sodium chloride 0.9 % 100 mL IVPB  Status:  Discontinued        2 g 200 mL/hr over 30 Minutes Intravenous Every 24 hours 05/02/20 0800 05/03/20 1340   04/30/20 1200  ceFEPIme (MAXIPIME) 2 g in sodium chloride 0.9 % 100 mL IVPB  Status:  Discontinued        2 g 200 mL/hr over 30 Minutes Intravenous Every 12 hours 04/30/20 1025 05/02/20 0800      Subjective: No new complaints C/o SOB, feels like this was better after lasix  Objective:  Vitals:   05/09/20 1000 05/09/20 1034 05/09/20 1100 05/09/20 1200  BP: (!) 141/66 (!) 141/66    Pulse: (!) 103  (!) 110   Resp: (!) 24  (!) 24   Temp:    (!) 96 F (35.6 C)  TempSrc:    Axillary  SpO2: 94%  93%   Weight:      Height:        Intake/Output Summary (Last 24 hours) at 05/09/2020 1329 Last data filed at 05/09/2020 0600 Gross per 24 hour  Intake 1180.99 ml  Output 3000 ml  Net -1819.01 ml   Filed Weights   05/07/20 0500 05/08/20 0438 05/09/20 0419  Weight: 94.1 kg 95.7 kg 96 kg    Examination:  General exam: Appears calm and comfortable  Respiratory system: Clear to auscultation. Respiratory effort normal. Cardiovascular system: S1 & S2 heard, RRR.  Gastrointestinal system: Abdomen is nondistended, soft and nontender. Central nervous system: Alert and oriented. No focal neurological deficits. Extremities: extensive LE edema, noted to UE as well Skin: No rashes, lesions or ulcers Psychiatry: Judgement and insight appear normal. Mood & affect appropriate.     Data Reviewed: I have personally reviewed following labs and imaging studies  CBC: Recent  Labs  Lab 05/03/20 0115 05/04/20 0530 05/05/20 0500 05/06/20 0500 05/07/20 0551 05/08/20 0300 05/09/20 0427  WBC 2.4*   < > 3.1* 2.9* 2.6* 3.1* 3.1*  NEUTROABS 1.1*  --   --   --  1.8 2.4 2.4  HGB 7.4*   < > 8.4* 7.9* 7.5* 11.2* 10.6*  HCT 22.6*   < > 26.8* 25.3* 23.8* 34.9* 32.8*  MCV 89.7   < > 95.7 95.1 96.0 91.8 92.1  PLT 36*   < > 34* 34* 36* 36* 39*   < > = values in this interval not displayed.    Basic Metabolic Panel: Recent Labs  Lab 05/05/20 1615 05/06/20 0500 05/07/20 0551 05/08/20 0300 05/09/20 0427  NA 154* 148* 148* 148* 146*  K 4.0 3.2* 3.0* 3.6 3.0*  CL 119* 116* 117* 117* 116*  CO2 23 22 22  21* 21*  GLUCOSE 270* 118* 106* 97 74  BUN 75* 71* 62* 56* 52*  CREATININE 1.60* 1.44* 1.48* 1.57* 1.68*  CALCIUM 7.1* 7.0* 7.1* 7.2* 7.3*  MG  --   --  2.5*  --   --     GFR: Estimated Creatinine Clearance: 42.8 mL/min (Reiley Keisler) (by C-G formula based on SCr of 1.68 mg/dL (H)).  Liver Function Tests: Recent Labs  Lab 05/03/20 1131 05/06/20 0500 05/07/20 0551 05/08/20 0300 05/09/20 0427  AST 35 20 19 21 18   ALT 20 15 14 15 14   ALKPHOS 57 65 63 69 67  BILITOT 0.8 0.5 0.5 0.7 0.6  PROT 5.7* 5.2* 5.1* 5.3* 5.3*  ALBUMIN 2.8* 2.7* 2.5* 2.8* 2.7*    CBG: Recent Labs  Lab 05/08/20 1619 05/08/20 1937 05/08/20 2341 05/09/20 0741 05/09/20 1226  GLUCAP 225* 180* 144* 96 154*     Recent Results (from the past 240 hour(s))  MRSA PCR Screening     Status: None   Collection Time: 05/04/2020  5:25 PM   Specimen: Nasopharyngeal  Result Value Ref Range Status   MRSA by PCR NEGATIVE NEGATIVE Final    Comment:        The GeneXpert MRSA Assay (FDA approved for NASAL specimens only), is one component of [facility] comprehensive MRSA colonization surveillance program. It is not intended to diagnose MRSA infection nor to guide  or monitor treatment for MRSA infections. Performed at [facility], [place] [address] [place], [place] [address]   Culture,  blood (Routine X 2) w Reflex to ID Panel     Status: None   Collection Time: 05/01/20  8:05 AM   Specimen: BLOOD LEFT HAND  Result Value Ref Range Status   Specimen Description   Final    BLOOD LEFT HAND Performed at [facility] [address], [place], [place] [address]    Special Requests   Final    BOTTLES DRAWN AEROBIC ONLY Blood Culture adequate volume Performed at [facility] [address] [place], [place] [address]    Culture   Final    NO GROWTH 5 DAYS Performed at [facility], [place] [address], [place], [place] [address]    Report Status 05/06/2020 FINAL  Final  Culture, blood (Routine X 2) w Reflex to ID Panel     Status: None   Collection Time: 05/01/20  8:11 AM   Specimen: BLOOD LEFT HAND  Result Value Ref Range Status   Specimen Description   Final    BLOOD LEFT HAND Performed at [facility] [address], [place], [place] [address]    Special Requests   Final    BOTTLES DRAWN AEROBIC ONLY Blood Culture results may not be optimal due to an inadequate volume of blood received in culture bottles Performed at [facility] [address] [place], [place] [address]    Culture   Final    NO GROWTH 5 DAYS Performed at [facility], [place] [address], [place], [place] [address]    Report Status 05/06/2020 FINAL  Final  Culture, Urine     Status: None   Collection Time: 05/05/20  7:48 AM   Specimen: Urine, Random  Result Value Ref Range Status   Specimen Description   Final    URINE, RANDOM Performed at [facility] [address], [place], [place] [address]    Special Requests   Final    URINE, RANDOM Performed at [facility] [address] [place], [place] [address]    Culture   Final    NO GROWTH Performed at [facility], [place] [address], [place], [place] [address]    Report Status 05/06/2020 FINAL  Final          Radiology Studies: DG CHEST PORT 1 VIEW  Result Date: 05/08/2020 CLINICAL DATA:  69 year old male status post PICC placement. EXAM: PORTABLE CHEST 1 VIEW COMPARISON:  Chest radiograph dated 05/08/2020. FINDINGS: Right IJ central venous line in similar position. There has been interval placement of Martavia Tye right-sided PICC with tip over central SVC close to the cavoatrial junction. There is shallow inspiration with bibasilar atelectasis. No significant interval change in the appearance of the lungs compared to the earlier radiograph. No large pleural effusion or pneumothorax. Stable cardiac silhouette. No acute osseous pathology. IMPRESSION: Interval placement of Pamala Hayman right-sided PICC with tip over central SVC close to the cavoatrial junction. Electronically Signed   By: Anner Crete M.D.   On: 05/08/2020 16:44   DG CHEST PORT 1 VIEW  Result Date: 05/08/2020 CLINICAL DATA:  69 year old male with Bentli Llorente history of shortness of breath EXAM: PORTABLE CHEST 1 VIEW COMPARISON:  05/06/2020 FINDINGS: Cardiomediastinal silhouette unchanged in size and contour. Low lung volumes persist. Patchy opacities at the lung bases persist. No pneumothorax or pleural effusion. No  confluent airspace disease. Unchanged right IJ central venous catheter. No acute displaced fracture IMPRESSION: Unchanged appearance of the chest x-ray with low lung volumes and basilar atelectasis. Right IJ central venous catheter unchanged Electronically Signed   By: Corrie Mckusick D.O.   On: 05/08/2020 13:13   [place] EKG SITE RITE  Result Date: 05/08/2020 If Site Rite image not attached, placement could not be confirmed due to current cardiac rhythm.       Scheduled Meds:  allopurinol  100 mg Oral BID   amLODipine  10 mg Per Tube Daily   bisacodyl  10 mg Rectal Once   Chlorhexidine Gluconate Cloth  6 each Topical Daily   collagenase   Topical Daily   feeding supplement  237 mL Oral TID BM   furosemide  40 mg Intravenous Daily    insulin aspart  0-15 Units Subcutaneous Q4H   mouth rinse  15 mL Mouth Rinse BID   multivitamin with minerals  1 tablet Oral Daily   pantoprazole (PROTONIX) IV  40 mg Intravenous Q12H   potassium chloride  40 mEq Oral Q4H   sodium chloride flush  10-40 mL Intracatheter Q12H   sodium chloride flush  10-40 mL Intracatheter Q12H   Continuous Infusions:  heparin 1,100 Units/hr (05/09/20 1034)     LOS: 10 days    Time spent: over 30 min    Fayrene Helper, MD [facility]   To contact the attending provider between 7A-7P or the covering provider during after hours 7P-7A, please log into the web site [email or web address] and access using universal  password for that web site. If you do not have the password, please call the hospital operator.  05/09/2020, 1:29 PM

## 2020-05-09 NOTE — Progress Notes (Addendum)
  Speech Language Pathology Treatment: Dysphagia  Patient Details Name: Dean Dixon MRN: [id number] DOB: 10/13/1950 Today's Date: 05/09/2020 Time: 0174-9449 SLP Time Calculation (min) (ACUTE ONLY): 44 min  Assessment / Plan / Recommendation Clinical Impression  Pt today sitting upright in bed, alert and appropriate.  He only allows his HOB to be elevated to approx 48*, and SlP advised fully upright but understand compression on abdomen increasing discomfort with position.  Trendelenburg posture attempted to improve positioning for po.   Advised strictly that pt brings head forward, chin down to prevent opening airway with neck position - to which he agreed and demonstrated with mod verbal/visual cues.  Pt expresses desire for water - admits he does not mind nectar thick diet cola, tea nor water as long as they are "cold".  Baseline throat clearing noted as well as wet voice with nectar.  Cued throat clearing removed wet vocal quality.  SLP reviewed MBS with pt including boluses accumulating at pyriform sinus prior to swallow with resultant aspiration thus need for pt to swallow "fast" when he gets bolus in his mouth.  Using teach back, pt able to verbalize indication of wet voice and delay.  Provided written precautions to pt and reviewed with RN.   Pt would benefit for consideration for RMST to improve airway clearance.      Also observed pt with thin water via tsp - no indication of overt aspiration - Provided Provale cup for his use with thin water to provide satisfaction, maintain body's habituation of swallowing thin water, etc.  Please do not throw away BLUE CUP WITH LID as it releases only 1 TSP of water with every bolus attempt.  RN and pt informed.  Thanks!     HPI HPI: Dean Dixon is a 69 y.o. male with medical history significant of mantle cell lymphoma on CHOP. Presenting with dyspnea and ab distention. Reports 2 weeks of shortness of breath that has worsened in the last 2 days. ETT  11/7-11/9.   Pt underwent MBS approx 6 days ago and dys1/honey thick diet advised.  Follow up conducted and pt now on dys1/nectar as of 05/08/2020.      SLP Plan  Continue with current plan of care;MBS       Recommendations  Diet recommendations: Dysphagia 2 (fine chop);Nectar-thick liquid;Other(comment) (water via Provale cup) Liquids provided via: Cup (provale cup) Medication Administration: Whole meds with puree Supervision: Patient able to self feed Compensations: Slow rate;Small sips/bites;Other (Comment);Minimize environmental distractions Postural Changes and/or Swallow Maneuvers: Seated upright 90 degrees;Upright 30-60 min after meal (as upright as pt can manage, tilt head forward if only tolerates HOB up 45*)                Oral Care Recommendations: Oral care QID Follow up Recommendations: Other (comment) (TBD) SLP Visit Diagnosis: Dysphagia, oropharyngeal phase (R13.12) Plan: Continue with current plan of care;MBS       GO                Dean Dixon 05/09/2020, 10:37 AM   Dean Lime, MS [facility] SLP Acute Rehab Services Office [phone] Pager [phone]

## 2020-05-09 NOTE — Progress Notes (Signed)
ANTICOAGULATION CONSULT NOTE - Follow Up Consult  Pharmacy Consult for Heparin Indication: pulmonary embolus  Allergies  Allergen Reactions  . Ivp Dye [Iodinated Diagnostic Agents] Shortness Of Breath    "allergic to CT scan dye", took benadryl with last scan and tolerated well    Patient Measurements: Height: 5\' 4"  (162.6 cm) Weight: 96 kg (211 lb 10.3 oz) IBW/kg (Calculated) : 59.2 Heparin Dosing Weight: 81 kg  Vital Signs: Temp: 98.7 F (37.1 C) (11/17 0400) Temp Source: Axillary (11/17 0400) BP: 129/64 (11/17 0247) Pulse Rate: 90 (11/17 0247)  Labs: Recent Labs    05/06/20 0600 05/07/20 0551 05/08/20 0200 05/08/20 0300 05/08/20 1156 05/09/20 0427  HGB  --  7.5*  --  11.2*  --   --   HCT  --  23.8*  --  34.9*  --   --   PLT  --  36*  --  36*  --   --   HEPARINUNFRC   < >  --  0.42  --  0.58 0.55  CREATININE  --  1.48*  --  1.57*  --  1.68*   < > = values in this interval not displayed.    Estimated Creatinine Clearance: 42.8 mL/min (A) (by C-G formula based on SCr of 1.68 mg/dL (H)).   Medications:  Infusions:  . dextrose 50 mL/hr at 05/09/20 0300  . heparin 1,100 Units/hr (05/09/20 0300)    Assessment: Patient is a 69 y.o M with stage IV mantle cell lymphoma on chemotherapy (got CHOP on 04/21/20), presented to the ED on 11/7 with c/o SOB, groin swelling and emesis.  Chest CT showed acute PE.  Pharmacy is consulted to start heparin drip for VTE.  Significant Events: - Heparin stopped 11/14 PM for melenic stool, FOB + - GI consulted and found stool bag not melenic and is ok with resuming IV UFH.  Heparin resumed 11/15 after transfusion completed  Today, 05/09/2020:  HL 0.55, therapeutic on heparin 1100 units/hr  CBC:  Hgb improved to 11.2, Plt remain low/stable at 36k (11/16)  No bleeding, complications, or interrupted noted  Goal of Therapy:  Heparin level 0.3-0.7 units/ml Monitor platelets by anticoagulation protocol: Yes   Plan:   Continue  heparin IV infusion at 1100 units/hr  Daily heparin level and CBC  Continue to monitor s/s bleeding.  Dolly Rias RPh 05/09/2020, 5:05 AM

## 2020-05-09 NOTE — Progress Notes (Signed)
PT Cancellation Note  Patient Details Name: Dean Dixon MRN: [id number] DOB: [birth year]   Cancelled Treatment:     Therapy attempted today and refused by patient. He requested that we come back tomorrow, saying that he is very tired "needs a day to rest". Plan to return another time if the schedule permits.   Juel Burrow, [facility] [phone]

## 2020-05-10 ENCOUNTER — Ambulatory Visit
Admit: 2020-05-10 | Discharge: 2020-05-10 | Disposition: A | Discharge status: Home / Self Care | Payer: Medicare Other | Attending: Radiation Oncology | Admitting: Radiation Oncology

## 2020-05-10 ENCOUNTER — Inpatient Hospital Stay (HOSPITAL_COMMUNITY): Payer: Medicare Other

## 2020-05-10 ENCOUNTER — Ambulatory Visit: Payer: Medicare Other | Admitting: Radiation Oncology

## 2020-05-10 DIAGNOSIS — I2699 Other pulmonary embolism without acute cor pulmonale: Secondary | ICD-10-CM | POA: Diagnosis not present

## 2020-05-10 DIAGNOSIS — C8310 Mantle cell lymphoma, unspecified site: Secondary | ICD-10-CM | POA: Diagnosis not present

## 2020-05-10 DIAGNOSIS — C8318 Mantle cell lymphoma, lymph nodes of multiple sites: Secondary | ICD-10-CM | POA: Diagnosis not present

## 2020-05-10 DIAGNOSIS — E883 Tumor lysis syndrome: Secondary | ICD-10-CM | POA: Diagnosis not present

## 2020-05-10 LAB — CBC WITH DIFFERENTIAL/PLATELET
Abs Immature Granulocytes: 0.14 10*3/uL — ABNORMAL HIGH (ref 0.00–0.07)
Basophils Absolute: 0 10*3/uL (ref 0.0–0.1)
Basophils Relative: 1 %
Eosinophils Absolute: 0 10*3/uL (ref 0.0–0.5)
Eosinophils Relative: 0 %
HCT: 33.3 % — ABNORMAL LOW (ref 39.0–52.0)
Hemoglobin: 10.8 g/dL — ABNORMAL LOW (ref 13.0–17.0)
Immature Granulocytes: 5 %
Lymphocytes Relative: 8 %
Lymphs Abs: 0.3 10*3/uL — ABNORMAL LOW (ref 0.7–4.0)
MCH: 29.9 pg (ref 26.0–34.0)
MCHC: 32.4 g/dL (ref 30.0–36.0)
MCV: 92.2 fL (ref 80.0–100.0)
Monocytes Absolute: 0.3 10*3/uL (ref 0.1–1.0)
Monocytes Relative: 9 %
Neutro Abs: 2.4 10*3/uL (ref 1.7–7.7)
Neutrophils Relative %: 77 %
Platelets: 45 10*3/uL — ABNORMAL LOW (ref 150–400)
RBC: 3.61 MIL/uL — ABNORMAL LOW (ref 4.22–5.81)
RDW: 17.8 % — ABNORMAL HIGH (ref 11.5–15.5)
WBC: 3.1 10*3/uL — ABNORMAL LOW (ref 4.0–10.5)
nRBC: 0 % (ref 0.0–0.2)

## 2020-05-10 LAB — GLUCOSE, CAPILLARY
Glucose-Capillary: 137 mg/dL — ABNORMAL HIGH (ref 70–99)
Glucose-Capillary: 169 mg/dL — ABNORMAL HIGH (ref 70–99)
Glucose-Capillary: 235 mg/dL — ABNORMAL HIGH (ref 70–99)
Glucose-Capillary: 162 mg/dL — ABNORMAL HIGH (ref 70–99)
Glucose-Capillary: 123 mg/dL — ABNORMAL HIGH (ref 70–99)

## 2020-05-10 LAB — COMPREHENSIVE METABOLIC PANEL
ALT: 12 U/L (ref 0–44)
AST: 16 U/L (ref 15–41)
Albumin: 2.7 g/dL — ABNORMAL LOW (ref 3.5–5.0)
Alkaline Phosphatase: 66 U/L (ref 38–126)
Anion gap: 12 (ref 5–15)
BUN: 46 mg/dL — ABNORMAL HIGH (ref 8–23)
CO2: 22 mmol/L (ref 22–32)
Calcium: 7.6 mg/dL — ABNORMAL LOW (ref 8.9–10.3)
Chloride: 112 mmol/L — ABNORMAL HIGH (ref 98–111)
Creatinine, Ser: 1.71 mg/dL — ABNORMAL HIGH (ref 0.61–1.24)
GFR, Estimated: 43 mL/min — ABNORMAL LOW (ref >60)
Glucose, Bld: 107 mg/dL — ABNORMAL HIGH (ref 70–99)
Potassium: 3.2 mmol/L — ABNORMAL LOW (ref 3.5–5.1)
Sodium: 146 mmol/L — ABNORMAL HIGH (ref 135–145)
Total Bilirubin: 0.5 mg/dL (ref 0.3–1.2)
Total Protein: 5.2 g/dL — ABNORMAL LOW (ref 6.5–8.1)

## 2020-05-10 LAB — MAGNESIUM: Magnesium: 2.1 mg/dL (ref 1.7–2.4)

## 2020-05-10 LAB — HEPARIN LEVEL (UNFRACTIONATED): Heparin Unfractionated: 0.36 IU/mL (ref 0.30–0.70)

## 2020-05-10 LAB — PHOSPHORUS: Phosphorus: 2.5 mg/dL (ref 2.5–4.6)

## 2020-05-10 LAB — URIC ACID: Uric Acid, Serum: 2.7 mg/dL — ABNORMAL LOW (ref 3.7–8.6)

## 2020-05-10 MED ORDER — FUROSEMIDE 10 MG/ML IJ SOLN
40.0000 mg | Freq: Once | INTRAMUSCULAR | Status: AC
  Administered 2020-05-10: 40 mg via INTRAVENOUS
  Filled 2020-05-10: qty 4

## 2020-05-10 MED ORDER — POLYETHYLENE GLYCOL 3350 17 G PO PACK
17.0000 g | PACK | Freq: Two times a day (BID) | ORAL | Status: DC
  Administered 2020-05-10 (×2): 17 g via ORAL
  Filled 2020-05-10 (×2): qty 1

## 2020-05-10 MED ORDER — POTASSIUM CHLORIDE CRYS ER 20 MEQ PO TBCR
40.0000 meq | EXTENDED_RELEASE_TABLET | ORAL | Status: AC
  Administered 2020-05-10 (×2): 40 meq via ORAL
  Filled 2020-05-10 (×2): qty 2

## 2020-05-10 MED ORDER — PANTOPRAZOLE SODIUM 40 MG PO TBEC
40.0000 mg | DELAYED_RELEASE_TABLET | Freq: Every day | ORAL | Status: DC
  Administered 2020-05-11: 40 mg via ORAL
  Filled 2020-05-10: qty 1

## 2020-05-10 NOTE — Progress Notes (Signed)
Physical Therapy Treatment Patient Details Name: Dean Dixon MRN: [id number] DOB: [birth year] Today's Date: 05/10/2020    History of Present Illness 69 year old male with stage IV mantle cell lymphoma presenting 05/02/2020 with N/V and progressive SOB found to have acute left segmental PE. Developed hypertension 250/100 with respiratory distress with chest pain, unable to tolerate BiPAP and was intubated, Extubated 11/9.    PT Comments    Pt found awake in bed and agrees to PT. He is on RA upon our arrival. Total A +2 to get to EOB, needing assist with maneuvering B LE's to EOB, pivoting bottom, and pulling trunk upright; additional assistance needed once sitting to remain upright or else pt begins to slowly fall backwards or onto his L elbow. Unable to maintain static sitting balance. He attempts sit<>stand twice, unsuccessfully. First attempt is with one person on each side to assist with lift off, second attempt is x1 bearhug. He is able to get bottom off of bed but not able to execute hip extension or remain standing. Pt is then Total +2 to get back supine, and total +2 for rolling L and R to get clean bed pad and lift pad underneath; he will reach for and hold onto bed railing, but unable independently hold self in sidelying. Room lift machine wasn't connect to charger, so machine battery is dead. Total +2 for rolling L/R to remove lift pad. Pt very pleasant and understanding about not being able to get into chair, although he was looking forward to it.   Follow Up Recommendations  SNF;CIR     Equipment Recommendations       Recommendations for Other Services       Precautions / Restrictions Precautions Precautions: Fall Precaution Comments: incontinence, flexiseal Restrictions Weight Bearing Restrictions: No    Mobility  Bed Mobility Overal bed mobility: Needs Assistance Bed Mobility: Rolling;Supine to Sit;Sit to Supine Rolling: Total assist;+2 for physical assistance;+2 for  safety/equipment   Supine to sit: Total assist;HOB elevated;+2 for physical assistance;+2 for safety/equipment Sit to supine: Total assist;+2 for physical assistance;+2 for safety/equipment   General bed mobility comments: HOB elevated and cues to advance to EOB, pt unable due to fatigue and SOB with activity. Needs assist with B LE's, pivoting, and getting trunk upright and keeping it upright  Transfers Overall transfer level: Needs assistance Equipment used: None Transfers: Sit to/from Stand Sit to Stand: Total assist;+2 physical assistance;+2 safety/equipment         General transfer comment: Attempted twice to stand from EOB: First, x2 support under pt's arm for lift off, and second, x 1 bearhug. Patient able to clear bottom on both attemps but unable to complete hip extension or hold self in standing.  Ambulation/Gait                 Stairs             Wheelchair Mobility    Modified Rankin (Stroke Patients Only)       Balance                                            Cognition Arousal/Alertness: Awake/alert Behavior During Therapy: WFL for tasks assessed/performed Overall Cognitive Status: Within Functional Limits for tasks assessed  Exercises      General Comments        Pertinent Vitals/Pain Pain Assessment: No/denies pain Pain Intervention(s): Monitored during session    Home Living                      Prior Function            PT Goals (current goals can now be found in the care plan section) Acute Rehab PT Goals Patient Stated Goal: go home PT Goal Formulation: With patient Time For Goal Achievement: 05/17/20    Frequency    Min 2X/week      PT Plan      Co-evaluation              AM-PAC PT "6 Clicks" Mobility   Outcome Measure  Help needed turning from your back to your side while in a flat bed without using bedrails?: A  Lot Help needed moving from lying on your back to sitting on the side of a flat bed without using bedrails?: Total Help needed moving to and from a bed to a chair (including a wheelchair)?: Total Help needed standing up from a chair using your arms (e.g., wheelchair or bedside chair)?: Total Help needed to walk in hospital room?: Total Help needed climbing 3-5 steps with a railing? : Total 6 Click Score: 7    End of Session Equipment Utilized During Treatment: Gait belt Activity Tolerance: Patient limited by fatigue Patient left: in bed;with call bell/phone within reach;with family/visitor present;with bed alarm set Nurse Communication: Mobility status PT Visit Diagnosis: Difficulty in walking, not elsewhere classified (R26.2)     Time: 8413-2440 PT Time Calculation (min) (ACUTE ONLY): 34 min  Charges:  $Therapeutic Activity: 23-37 mins                     C. Parks Neptune, [facility] [phone]

## 2020-05-10 NOTE — Progress Notes (Signed)
ANTICOAGULATION CONSULT NOTE - Follow Up Consult  Pharmacy Consult for Heparin Indication: pulmonary embolus  Allergies  Allergen Reactions  . Ivp Dye [Iodinated Diagnostic Agents] Shortness Of Breath    "allergic to CT scan dye", took benadryl with last scan and tolerated well    Patient Measurements: Height: 5\' 4"  (162.6 cm) Weight: 104.1 kg (229 lb 8 oz) IBW/kg (Calculated) : 59.2 Heparin Dosing Weight: 81 kg  Vital Signs: Temp: 97.4 F (36.3 C) (11/18 0348) Temp Source: Axillary (11/18 0348) BP: 137/69 (11/17 2200) Pulse Rate: 104 (11/17 2200)  Labs: Recent Labs    05/08/20 0200 05/08/20 0300 05/08/20 0300 05/08/20 1156 05/09/20 0427 05/10/20 0324 05/10/20 0500  HGB  --  11.2*   < >  --  10.6*  --  10.8*  HCT  --  34.9*  --   --  32.8*  --  33.3*  PLT  --  36*  --   --  39*  --  45*  HEPARINUNFRC   < >  --   --  0.58 0.55 0.36  --   CREATININE  --  1.57*  --   --  1.68*  --  1.71*   < > = values in this interval not displayed.    Estimated Creatinine Clearance: 43.9 mL/min (A) (by C-G formula based on SCr of 1.71 mg/dL (H)).   Medications:  Infusions:  . dextrose 50 mL/hr at 05/10/20 0400  . heparin 1,100 Units/hr (05/10/20 0400)    Assessment: Patient is a 69 y.o M with stage IV mantle cell lymphoma on chemotherapy (got CHOP on 04/21/20), presented to the ED on 11/7 with c/o SOB, groin swelling and emesis.  Chest CT showed acute PE.  Pharmacy is consulted to start heparin drip for VTE.  Significant Events: - Heparin stopped 11/14 PM for melenic stool, FOB + - GI consulted and found stool bag not melenic and is ok with resuming IV UFH.  Heparin resumed 11/15 after transfusion completed  Today, 05/10/2020:  HL 0.36, therapeutic on heparin 1100 units/hr  CBC:  Hgb 10.8, Plt remain low/stable at 45k  No bleeding, complications, or interrupted noted  Goal of Therapy:  Heparin level 0.3-0.7 units/ml Monitor platelets by anticoagulation protocol:  Yes   Plan:   Continue heparin IV infusion at 1100 units/hr  Daily heparin level and CBC  Continue to monitor s/s bleeding.  Dolly Rias RPh 05/10/2020, 6:42 AM

## 2020-05-10 NOTE — Progress Notes (Addendum)
HEMATOLOGY-ONCOLOGY PROGRESS NOTE  SUBJECTIVE:  More alert this morning.  Denies abdominal pain, nausea, vomiting.  No bleeding reported.  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 ([place])   06/29/2019 - 10/20/2019 Chemotherapy   The patient had dexamethasone (DECADRON) 4 MG tablet, 8 mg, Oral, Daily, 1 of 1 cycle, Start date: 06/20/2019, End date: 11/17/2019 palonosetron (ALOXI) injection 0.25 mg, 0.25 mg, Intravenous,  Once, 5 of 5 cycles Administration: 0.25 mg (07/27/2019), 0.25 mg (06/30/2019), 0.25 mg (08/24/2019), 0.25 mg (10/20/2019), 0.25 mg (09/21/2019) pegfilgrastim (NEULASTA ONPRO KIT) injection 6 mg, 6 mg, Subcutaneous, Once, 3 of 3 cycles Administration: 6 mg (08/25/2019), 6 mg (09/22/2019) bendamustine (BENDEKA) 200 mg in sodium chloride 0.9 % 50 mL (3.4483 mg/mL) chemo infusion, 90 mg/m2 = 200 mg, Intravenous,  Once, 5 of 5 cycles Administration: 200 mg (06/29/2019), 200 mg (06/30/2019), 200 mg (07/27/2019), 200 mg (07/28/2019), 200 mg (08/24/2019), 200 mg (08/25/2019), 200 mg (09/21/2019), 200 mg (09/22/2019) riTUXimab-pvvr (RUXIENCE) 800 mg in sodium chloride 0.9 % 250 mL (2.4242 mg/mL) infusion, 375 mg/m2 = 800 mg, Intravenous,  Once, 5 of 5 cycles Dose modification: 375 mg/m2 (original dose 375 mg/m2, Cycle 2), 400 mg (original dose 375 mg/m2, Cycle 2, Reason: Other (see comments), Comment: finishing rituximab from previous day, infusion not completed), 300 mg (original dose 300 mg, Cycle 4) Administration: 800 mg (06/29/2019), 800 mg (07/27/2019), 800 mg (08/24/2019), 400 mg (07/28/2019), 800 mg (10/20/2019), 800 mg (09/21/2019), 300 mg (09/22/2019)  for chemotherapy treatment.    04/20/2020 -  Chemotherapy   The patient had DOXOrubicin (ADRIAMYCIN) chemo injection 100 mg, 48 mg/m2 = 104 mg, Intravenous,  Once, 1 of 8 cycles Administration: 100 mg (04/21/2020) palonosetron (ALOXI) injection 0.25 mg, 0.25 mg, Intravenous,   Once, 1 of 8 cycles Administration: 0.25 mg (04/21/2020) pegfilgrastim-cbqv (UDENYCA) injection 6 mg, 6 mg, Subcutaneous, Once, 1 of 1 cycle Administration: 6 mg (04/24/2020) vinCRIStine (ONCOVIN) 2 mg in sodium chloride 0.9 % 50 mL chemo infusion, 2 mg, Intravenous,  Once, 1 of 8 cycles Administration: 2 mg (04/21/2020) cyclophosphamide (CYTOXAN) 1,560 mg in sodium chloride 0.9 % 250 mL chemo infusion, 750 mg/m2 = 1,560 mg, Intravenous,  Once, 1 of 8 cycles Administration: 1,560 mg (04/21/2020) fosaprepitant (EMEND) 150 mg in sodium chloride 0.9 % 145 mL IVPB, 150 mg, Intravenous,  Once, 1 of 8 cycles Administration: 150 mg (04/21/2020)  for chemotherapy treatment.       REVIEW OF SYSTEMS:   Negative except as noted in the HPI.  PHYSICAL EXAMINATION: ECOG PERFORMANCE STATUS: 3 - Symptomatic, >50% confined to bed  Vitals:   05/10/20 1200 05/10/20 1300  BP:  132/82  Pulse:  (!) 116  Resp:    Temp: 99 F (37.2 C)   SpO2:  97%   Filed Weights   05/08/20 0438 05/09/20 0419 05/10/20 0500  Weight: 95.7 kg 96 kg 104.1 kg    Intake/Output from previous day: 11/17 0701 - 11/18 0700 In: 1194.3 [I.V.:1194.3] Out: 1400 [Urine:1400]  GENERAL: Awake, alert, no distress SKIN: skin color, texture, turgor are normal, no rashes or significant lesions EYES: normal, Conjunctiva are pink and non-injected, sclera clear LUNGS: Diminished HEART: regular rate & rhythm and no murmurs, 1+ bilateral lower extremity edema ABDOMEN: Positive bowel sounds, distention somewhat improved, no tenderness NEURO: Alert and oriented x3.  Has intermittent periods of confusion per nursing.  LABORATORY DATA:  I have reviewed the data as listed CMP  Latest Ref Rng & Units 05/10/2020 05/09/2020 05/08/2020  Glucose 70 - 99 mg/dL 107(H) 74 97  BUN 8 - 23 mg/dL 46(H) 52(H) 56(H)  Creatinine 0.61 - 1.24 mg/dL 1.71(H) 1.68(H) 1.57(H)  Sodium 135 - 145 mmol/L 146(H) 146(H) 148(H)  Potassium 3.5 - 5.1 mmol/L 3.2(L)  3.0(L) 3.6  Chloride 98 - 111 mmol/L 112(H) 116(H) 117(H)  CO2 22 - 32 mmol/L 22 21(L) 21(L)  Calcium 8.9 - 10.3 mg/dL 7.6(L) 7.3(L) 7.2(L)  Total Protein 6.5 - 8.1 g/dL 5.2(L) 5.3(L) 5.3(L)  Total Bilirubin 0.3 - 1.2 mg/dL 0.5 0.6 0.7  Alkaline Phos 38 - 126 U/L 66 67 69  AST 15 - 41 U/L _0 ALT 0 - 44 U/L _1 . CBC Latest Ref Rng & Units 05/10/2020 05/09/2020 05/08/2020  WBC 4.0 - 10.5 K/uL 3.1(L) 3.1(L) 3.1(L)  Hemoglobin 13.0 - 17.0 g/dL 10.8(L) 10.6(L) 11.2(L)  Hematocrit 39 - 52 % 33.3(L) 32.8(L) 34.9(L)  Platelets 150 - 400 K/uL 45(L) 39(L) 36(L)    Lab Results  Component Value Date   WBC 3.1 (L) 05/10/2020   HGB 10.8 (L) 05/10/2020   HCT 33.3 (L) 05/10/2020   MCV 92.2 05/10/2020   PLT 45 (L) 05/10/2020   NEUTROABS 2.4 05/10/2020    CT ABDOMEN PELVIS WO CONTRAST  Addendum Date: 04/19/2020   ADDENDUM REPORT: 04/19/2020 12:54 ADDENDUM: The peritoneal fluid within the pelvis is not enhancing as this is a non IV contrast imaging exam. Therefore would described fluid collection as having thickened rim. Favor peritoneal metastasis. Findings conveyed toSCOTT GOLDSTON on 04/19/2020  at12:54. Electronically Signed   By: Suzy Bouchard M.D.   On: 04/19/2020 12:54   Result Date: 04/19/2020 CLINICAL DATA:  Abdominal pain.  Tachycardia.  Mantle cell lymphoma EXAM: CT ABDOMEN AND PELVIS WITHOUT CONTRAST TECHNIQUE: Multidetector CT imaging of the abdomen and pelvis was performed following the standard protocol without IV contrast. COMPARISON:  PET-CT scan 01/02/2020 FINDINGS: Lower chest: New small RIGHT effusion. No pericardial effusion. There is new precordial adenopathy anterior to the RIGHT heart and liver (image 9/2). Hepatobiliary: New intraperitoneal free fluid along the margin of the RIGHT hepatic lobe. This fluid is simple attenuation. The liver appears unchanged on noncontrast exam. Large cyst the LEFT hepatic lobe. Gallbladder normal. Pancreas: Pancreas is grossly  normal noncontrast exam Spleen: . spleen is unchanged Adrenals/urinary tract: Adrenal glands kidneys and ureters normal. Bladder Stomach/Bowel: Stomach duodenum normal. There is a new mass lesion in the LEFT upper quadrant measuring 14.2 by 13.0 cm. The mass may be associated the small bowel or the small bowel mesentery. The descending colon is also involving the mass (image 43/2. There is extensive fluid within the leaves of the mesentery of the distal small bowel. Appendix normal. Ascending transverse colon normal. The descending colon tracks along the new LEFT upper quadrant mass. Vascular/Lymphatic: New large LEFT upper quadrant mass either represents lymphoma involving the small bowel or mesentery. Mass is very large described the stomach section. Additionally there is new periportal adenopathy with enlarged lymph nodes along the aorta measuring up to 2 cm (image 44/2. There is nodularity along the greater omentum abdominal on the LEFT. Reproductive: Prostate unremarkable Other: Small volume free fluid the pelvis. There is enhancing rim to the fluid (image 73/2 which could indicate peritonitis. Musculoskeletal: No aggressive osseous lesion. IMPRESSION: 1. Dominant finding is new large (15 cm) lymphoid mass within LEFT upper quadrant. Mass is new from PET-CT 01/02/2020. Differential includes massive mesenteric adenopathy  versus lymphoma of the small bowel or descending colon. No oral or IV contrast. 2. New periaortic retroperitoneal lymphadenopathy. New precordial adenopathy. 3. Extensive fluid within the mesentery and omental thickening in LEFT lower quadrant. 4. Free fluid the pelvis with thin enhancing rim suggest peritonitis. 5. No evidence of bowel perforation. No evidence of bowel obstruction. Electronically Signed: By: Suzy Bouchard M.D. On: 04/19/2020 12:39   DG Chest 1 View  Result Date: 04/24/2020 CLINICAL DATA:  Elevated blood pressure. EXAM: CHEST  1 VIEW COMPARISON:  April 29, 2020  FINDINGS: The lung volumes are low. There are small bilateral pleural effusions with adjacent atelectasis. The heart size is mildly enlarged. The lung volumes are low. There is no pneumothorax. IMPRESSION: 1. Low lung volumes. 2. Small bilateral pleural effusions with adjacent atelectasis. Electronically Signed   By: Constance Holster M.D.   On: 04/24/2020 20:42   DG Chest 2 View  Result Date: 04/27/2020 CLINICAL DATA:  Shortness of breath lasting several days. Groin swelling and redness. EXAM: CHEST - 2 VIEW COMPARISON:  Chest CT 04/19/20 FINDINGS: Stable cardiomediastinal contours. Low lung volumes. Unchanged small bilateral pleural effusions. No airspace densities. IMPRESSION: Low lung volumes with persistent small bilateral pleural effusions. Electronically Signed   By: Kerby Moors M.D.   On: 04/30/2020 05:09   DG Abd 1 View  Result Date: 05/01/2020 CLINICAL DATA:  Tube placement EXAM: ABDOMEN - 1 VIEW COMPARISON:  April 29, 2020 FINDINGS: The enteric tube projects over the gastric antrum/pylorus. The tube is pointed distally. The visualized bowel gas pattern is unremarkable. IMPRESSION: The enteric tube projects over the gastric antrum/pylorus. Electronically Signed   By: Constance Holster M.D.   On: 04/26/2020 22:05   CT CHEST WO CONTRAST  Result Date: 04/19/2020 CLINICAL DATA:  Pneumonia. Pleural effusion. Concern for metastases. EXAM: CT CHEST WITHOUT CONTRAST TECHNIQUE: Multidetector CT imaging of the chest was performed following the standard protocol without IV contrast. COMPARISON:  CT dated Nov 14, 2019 FINDINGS: Cardiovascular: The heart size is stable. Coronary artery calcifications are noted. There is no significant pericardial effusion. Mediastinum/Nodes: -- No mediastinal lymphadenopathy. -- No hilar lymphadenopathy. --there is left axillary adenopathy. This has progressed since the prior study. There is mild right axillary adenopathy, progressed from prior study. -- No  supraclavicular lymphadenopathy. -- Normal thyroid gland where visualized. -there is mild diffuse esophageal wall thickening. Lungs/Pleura: There are trace to small bilateral pleural effusions, right greater than left. There is atelectasis at the right lung base. There is no pneumothorax. No area of consolidation concerning for pneumonia. Upper Abdomen: There is a partially visualized mass in the left upper quadrant, better visualized on the patient's prior CT. There is free fluid in the upper abdomen. Multiple enlarged pericardiophrenic lymph nodes are noted. There appears to be retroperitoneal adenopathy. Musculoskeletal: No chest wall abnormality. No bony spinal canal stenosis. IMPRESSION: 1. Trace to small bilateral pleural effusions, right greater than left. There is atelectasis at the right lung base. No area of consolidation concerning for pneumonia. 2. Mild diffuse esophageal wall thickening. Correlation with patient's symptoms is recommended. 3. Worsening axillary adenopathy. 4. Partially visualized mass in the left upper quadrant, better visualized on the patient's prior CT. 5. Small volume ascites. Aortic Atherosclerosis (ICD10-I70.0). Electronically Signed   By: Constance Holster M.D.   On: 04/19/2020 20:37   CT Angio Chest PE W and/or Wo Contrast  Result Date: 04/26/2020 CLINICAL DATA:  69 year old male with history of shortness of breath for the past few days. Abdominal  distension. History of mantle cell lymphoma. EXAM: CT ANGIOGRAPHY CHEST CT ABDOMEN AND PELVIS WITH CONTRAST TECHNIQUE: Multidetector CT imaging of the chest was performed using the standard protocol during bolus administration of intravenous contrast. Multiplanar CT image reconstructions and MIPs were obtained to evaluate the vascular anatomy. Multidetector CT imaging of the abdomen and pelvis was performed using the standard protocol during bolus administration of intravenous contrast. CONTRAST:  137m OMNIPAQUE IOHEXOL 350 MG/ML  SOLN COMPARISON:  CT the abdomen and pelvis 04/19/2020. CT the chest 04/19/2020. FINDINGS: CTA CHEST FINDINGS Cardiovascular: Study is limited by patient respiratory motion. However, despite this limitation there are segmental sized filling defects within pulmonary arteries to the left upper and lower lobes best appreciated on axial images 73 and 106 of series 5, and axial image 148 of series 5 respectively, compatible with pulmonary embolism. Heart size is normal. There is no significant pericardial fluid, thickening or pericardial calcification. Aortic atherosclerosis. No definite coronary artery calcifications. Mediastinum/Nodes: Multiple enlarged anterior mediastinal lymph nodes noted inferiorly, immediately above the right hemidiaphragm, measuring up to 2.3 cm in short axis (axial image 13 of series 11), increased compared to the prior study. Prominent but nonenlarged distal paraesophageal lymph node measuring 8 mm in short axis, nonspecific. Circumferential thickening of the distal third of the esophagus. Mildly enlarged left axillary lymph nodes measuring up to 1.3 cm in short axis. Lungs/Pleura: Collapse of the trachea and mainstem bronchi during expiration, indicative of tracheobronchomalacia. No acute consolidative airspace disease. Small bilateral pleural effusions lying dependently. No definite suspicious appearing pulmonary nodules or masses are noted. Musculoskeletal: There are no aggressive appearing lytic or blastic lesions noted in the visualized portions of the skeleton. Review of the MIP images confirms the above findings. CT ABDOMEN and PELVIS FINDINGS Hepatobiliary: Multiple well-defined low-attenuation lesions are again noted throughout the liver, similar in size, number and distribution to the prior study, largest of which are compatible with simple cysts measuring up to 5.3 x 3.7 cm in segment 2 and for a (axial image 18 of series 11). Smaller lesions are too small to definitively  characterize, but also favored to represent small cysts or biliary hamartomas. No new suspicious hepatic lesions. No intra or extrahepatic biliary ductal dilatation. 6 mm calcified gallstone lying dependently in the gallbladder. Gallbladder is not distended. Pancreas: No pancreatic mass. No pancreatic ductal dilatation. No pancreatic or peripancreatic fluid collections or inflammatory changes. Spleen: Unremarkable. Adrenals/Urinary Tract: 1.2 cm low-attenuation lesion in the upper pole of the right kidney and exophytic 2.4 cm low-attenuation lesion in the upper pole of the left kidney, compatible with simple cysts. Other subcentimeter low-attenuation lesions in both kidneys, too small to definitively characterize, but favored to represent tiny cysts. No hydroureteronephrosis. Urinary bladder is nearly decompressed, but otherwise unremarkable in appearance. Stomach/Bowel: Stomach is unremarkable in appearance. No pathologic dilatation of small bowel or colon. Multiple bowel loops in the left upper quadrant are intimately associated with the large soft tissue mass (discussed below). Colonic diverticulosis, particularly in the sigmoid colon, without definitive evidence to suggest an acute diverticulitis at this time. Vascular/Lymphatic: Aortic atherosclerosis, without evidence of aneurysm or dissection in the abdominal or pelvic vasculature. Extensive lymphadenopathy noted in the abdomen, most evident in the retroperitoneum where there are bulky para-aortic nodal masses measuring up to 2.3 x 4.1 cm near the left renal artery (axial image 41 of series 11), 3.2 x 4.6 cm adjacent to the infrarenal abdominal aorta (axial image 48 of series 11), and 3.4 x 3.2 cm adjacent to  the aortic bifurcation (axial image 54 of series 11), all of which appear similar to the recent prior study. Bulky celiac axis and gastrohepatic ligament lymph nodes are noted, measuring up to 1.9 cm in short axis (axial image 24 of series 11). Several  prominent but nonenlarged pelvic lymph nodes are noted (nonspecific). Reproductive: Prostate gland and seminal vesicles are unremarkable in appearance. Other: Bulky poorly defined soft tissue mass in the left upper quadrant of the abdomen which is intimately associated with multiple adjacent bowel loops, and difficult to distinctly defined, but estimated to measure approximately 15.5 x 13.5 x 13.4 cm (axial image 38 of series 11 and coronal image 53 of series 14). Moderate volume of ascites. Diffuse enhancement of the peritoneal membranes, indicative of widespread peritoneal metastatic disease. Extensive omental caking, best appreciated in the left side of the abdomen where the bulkiest portion of this measures approximately 14.9 x 4.0 cm (axial image 47 of series 11). No pneumoperitoneum. Musculoskeletal: There are no aggressive appearing lytic or blastic lesions noted in the visualized portions of the skeleton. Review of the MIP images confirms the above findings. IMPRESSION: 1. Study is positive for segmental sized pulmonary emboli in the left lung. These appear nonocclusive at this time, and there is no associated pulmonary hemorrhage to suggest frank pulmonary infarct. 2. Bulky soft tissue mass in the left upper quadrant with extensive lymphadenopathy in the chest and abdomen, and evidence of widespread intraperitoneal metastatic disease, as detailed above. 3. Small bilateral pleural effusions lying dependently. 4. Aortic atherosclerosis. 5. Tracheobronchomalacia. 6. Cholelithiasis. 7. Colonic diverticulosis. Electronically Signed   By: Vinnie Langton M.D.   On: 04/26/2020 12:50   CT Abdomen Pelvis W Contrast  Result Date: 05/18/2020 CLINICAL DATA:  69 year old male with history of shortness of breath for the past few days. Abdominal distension. History of mantle cell lymphoma. EXAM: CT ANGIOGRAPHY CHEST CT ABDOMEN AND PELVIS WITH CONTRAST TECHNIQUE: Multidetector CT imaging of the chest was performed  using the standard protocol during bolus administration of intravenous contrast. Multiplanar CT image reconstructions and MIPs were obtained to evaluate the vascular anatomy. Multidetector CT imaging of the abdomen and pelvis was performed using the standard protocol during bolus administration of intravenous contrast. CONTRAST:  139m OMNIPAQUE IOHEXOL 350 MG/ML SOLN COMPARISON:  CT the abdomen and pelvis 04/19/2020. CT the chest 04/19/2020. FINDINGS: CTA CHEST FINDINGS Cardiovascular: Study is limited by patient respiratory motion. However, despite this limitation there are segmental sized filling defects within pulmonary arteries to the left upper and lower lobes best appreciated on axial images 73 and 106 of series 5, and axial image 148 of series 5 respectively, compatible with pulmonary embolism. Heart size is normal. There is no significant pericardial fluid, thickening or pericardial calcification. Aortic atherosclerosis. No definite coronary artery calcifications. Mediastinum/Nodes: Multiple enlarged anterior mediastinal lymph nodes noted inferiorly, immediately above the right hemidiaphragm, measuring up to 2.3 cm in short axis (axial image 13 of series 11), increased compared to the prior study. Prominent but nonenlarged distal paraesophageal lymph node measuring 8 mm in short axis, nonspecific. Circumferential thickening of the distal third of the esophagus. Mildly enlarged left axillary lymph nodes measuring up to 1.3 cm in short axis. Lungs/Pleura: Collapse of the trachea and mainstem bronchi during expiration, indicative of tracheobronchomalacia. No acute consolidative airspace disease. Small bilateral pleural effusions lying dependently. No definite suspicious appearing pulmonary nodules or masses are noted. Musculoskeletal: There are no aggressive appearing lytic or blastic lesions noted in the visualized portions of the skeleton. Review  of the MIP images confirms the above findings. CT ABDOMEN and  PELVIS FINDINGS Hepatobiliary: Multiple well-defined low-attenuation lesions are again noted throughout the liver, similar in size, number and distribution to the prior study, largest of which are compatible with simple cysts measuring up to 5.3 x 3.7 cm in segment 2 and for a (axial image 18 of series 11). Smaller lesions are too small to definitively characterize, but also favored to represent small cysts or biliary hamartomas. No new suspicious hepatic lesions. No intra or extrahepatic biliary ductal dilatation. 6 mm calcified gallstone lying dependently in the gallbladder. Gallbladder is not distended. Pancreas: No pancreatic mass. No pancreatic ductal dilatation. No pancreatic or peripancreatic fluid collections or inflammatory changes. Spleen: Unremarkable. Adrenals/Urinary Tract: 1.2 cm low-attenuation lesion in the upper pole of the right kidney and exophytic 2.4 cm low-attenuation lesion in the upper pole of the left kidney, compatible with simple cysts. Other subcentimeter low-attenuation lesions in both kidneys, too small to definitively characterize, but favored to represent tiny cysts. No hydroureteronephrosis. Urinary bladder is nearly decompressed, but otherwise unremarkable in appearance. Stomach/Bowel: Stomach is unremarkable in appearance. No pathologic dilatation of small bowel or colon. Multiple bowel loops in the left upper quadrant are intimately associated with the large soft tissue mass (discussed below). Colonic diverticulosis, particularly in the sigmoid colon, without definitive evidence to suggest an acute diverticulitis at this time. Vascular/Lymphatic: Aortic atherosclerosis, without evidence of aneurysm or dissection in the abdominal or pelvic vasculature. Extensive lymphadenopathy noted in the abdomen, most evident in the retroperitoneum where there are bulky para-aortic nodal masses measuring up to 2.3 x 4.1 cm near the left renal artery (axial image 41 of series 11), 3.2 x 4.6 cm  adjacent to the infrarenal abdominal aorta (axial image 48 of series 11), and 3.4 x 3.2 cm adjacent to the aortic bifurcation (axial image 54 of series 11), all of which appear similar to the recent prior study. Bulky celiac axis and gastrohepatic ligament lymph nodes are noted, measuring up to 1.9 cm in short axis (axial image 24 of series 11). Several prominent but nonenlarged pelvic lymph nodes are noted (nonspecific). Reproductive: Prostate gland and seminal vesicles are unremarkable in appearance. Other: Bulky poorly defined soft tissue mass in the left upper quadrant of the abdomen which is intimately associated with multiple adjacent bowel loops, and difficult to distinctly defined, but estimated to measure approximately 15.5 x 13.5 x 13.4 cm (axial image 38 of series 11 and coronal image 53 of series 14). Moderate volume of ascites. Diffuse enhancement of the peritoneal membranes, indicative of widespread peritoneal metastatic disease. Extensive omental caking, best appreciated in the left side of the abdomen where the bulkiest portion of this measures approximately 14.9 x 4.0 cm (axial image 47 of series 11). No pneumoperitoneum. Musculoskeletal: There are no aggressive appearing lytic or blastic lesions noted in the visualized portions of the skeleton. Review of the MIP images confirms the above findings. IMPRESSION: 1. Study is positive for segmental sized pulmonary emboli in the left lung. These appear nonocclusive at this time, and there is no associated pulmonary hemorrhage to suggest frank pulmonary infarct. 2. Bulky soft tissue mass in the left upper quadrant with extensive lymphadenopathy in the chest and abdomen, and evidence of widespread intraperitoneal metastatic disease, as detailed above. 3. Small bilateral pleural effusions lying dependently. 4. Aortic atherosclerosis. 5. Tracheobronchomalacia. 6. Cholelithiasis. 7. Colonic diverticulosis. Electronically Signed   By: Vinnie Langton M.D.    On: 05/02/2020 12:50   DG CHEST PORT  1 VIEW  Result Date: 05/10/2020 CLINICAL DATA:  Shortness of breath. Mantle cell lymphoma on chemotherapy EXAM: PORTABLE CHEST 1 VIEW COMPARISON:  05/08/2020 FINDINGS: Interval removal of right IJ approach central venous catheter. Right-sided PICC line remains in place terminating at the level of the distal SVC. Stable cardiomediastinal contours. Low lung volumes. Bibasilar atelectasis with increasing medial right basilar opacity. No appreciable pleural fluid collection. No pneumothorax. IMPRESSION: Bibasilar atelectasis with increasing medial right basilar opacity, atelectasis versus infection. Electronically Signed   By: Davina Poke D.O.   On: 05/10/2020 08:25   DG CHEST PORT 1 VIEW  Result Date: 05/08/2020 CLINICAL DATA:  69 year old male status post PICC placement. EXAM: PORTABLE CHEST 1 VIEW COMPARISON:  Chest radiograph dated 05/08/2020. FINDINGS: Right IJ central venous line in similar position. There has been interval placement of a right-sided PICC with tip over central SVC close to the cavoatrial junction. There is shallow inspiration with bibasilar atelectasis. No significant interval change in the appearance of the lungs compared to the earlier radiograph. No large pleural effusion or pneumothorax. Stable cardiac silhouette. No acute osseous pathology. IMPRESSION: Interval placement of a right-sided PICC with tip over central SVC close to the cavoatrial junction. Electronically Signed   By: Anner Crete M.D.   On: 05/08/2020 16:44   DG CHEST PORT 1 VIEW  Result Date: 05/08/2020 CLINICAL DATA:  69 year old male with a history of shortness of breath EXAM: PORTABLE CHEST 1 VIEW COMPARISON:  05/06/2020 FINDINGS: Cardiomediastinal silhouette unchanged in size and contour. Low lung volumes persist. Patchy opacities at the lung bases persist. No pneumothorax or pleural effusion. No confluent airspace disease. Unchanged right IJ central venous  catheter. No acute displaced fracture IMPRESSION: Unchanged appearance of the chest x-ray with low lung volumes and basilar atelectasis. Right IJ central venous catheter unchanged Electronically Signed   By: Corrie Mckusick D.O.   On: 05/08/2020 13:13   DG CHEST PORT 1 VIEW  Result Date: 05/06/2020 CLINICAL DATA:  Increased shortness of breath, hypertension EXAM: PORTABLE CHEST 1 VIEW COMPARISON:  04/30/2020 FINDINGS: Single frontal view of the chest demonstrates interval extubation and removal of the enteric catheter. Right internal jugular catheter tip projects over superior vena cava. The cardiac silhouette is stable. Lung volumes are diminished, without airspace disease, effusion, or pneumothorax. IMPRESSION: 1. Low lung volumes.  No acute process. Electronically Signed   By: Randa Ngo M.D.   On: 05/06/2020 18:51   DG CHEST PORT 1 VIEW  Result Date: 04/30/2020 CLINICAL DATA:  Central line placement EXAM: PORTABLE CHEST 1 VIEW COMPARISON:  Radiograph 04/23/2020 FINDINGS: Endotracheal tube tip terminates 2.2 cm from the expected location of the carina. Transesophageal tube tip and side port distal to the GE junction. Right IJ catheter tip terminates in the right atrium. Telemetry leads overlie the chest. Layering bilateral effusions adjacent passive atelectasis. Additional bandlike subsegmental atelectasis in the lungs as well. Stable borderline cardiomegaly accounting for portable technique. No acute osseous or soft tissue abnormality. Degenerative changes are present in the imaged spine and shoulders. IMPRESSION: 1. Endotracheal tube tip terminates 2.2 cm from the expected location of the carina. Could consider retraction 2 cm to the mid trachea. 2. Transesophageal tube tip and side port distal to the GE junction. 3. Right IJ catheter tip terminates in the right atrium. 4. Layering bilateral effusions with adjacent passive atelectasis and vascular congestion. 5. Stable borderline cardiomegaly.  Electronically Signed   By: Lovena Le M.D.   On: 04/30/2020 01:14   DG CHEST  PORT 1 VIEW  Result Date: 05/01/2020 CLINICAL DATA:  Endotracheal tube placement EXAM: PORTABLE CHEST 1 VIEW COMPARISON:  April 29, 2020 FINDINGS: The endotracheal tube terminates above the carina. The lung volumes are low. Again identified are bilateral pleural effusions with adjacent atelectasis. The heart size is stable. There is no pneumothorax. The enteric tube terminates below the left hemidiaphragm. IMPRESSION: Lines and tubes as above.  Otherwise, stable exam. Electronically Signed   By: Constance Holster M.D.   On: 05/08/2020 22:04   DG Chest Portable 1 View  Result Date: 04/19/2020 CLINICAL DATA:  Cough, wheezing EXAM: PORTABLE CHEST 1 VIEW COMPARISON:  01/09/2020 FINDINGS: Low lung volumes. Minimal atelectasis or scarring at the left lung base. No pleural effusion or pneumothorax. Heart size is within normal limits for technique. IMPRESSION: Minimal atelectasis or scarring at the left lung base. Electronically Signed   By: Macy Mis M.D.   On: 04/19/2020 12:18   DG Abd Portable 2 Views  Result Date: 04/26/2020 CLINICAL DATA:  Abdominal distension and pain. EXAM: PORTABLE ABDOMEN - 2 VIEW COMPARISON:  04/19/2020 FINDINGS: There is mild gaseous distension of the stomach. No dilated loops of large or small bowel. There is no evidence of free air. No radio-opaque calculi or other significant radiographic abnormality is seen. IMPRESSION: Nonobstructive bowel gas pattern. Electronically Signed   By: Kerby Moors M.D.   On: 04/24/2020 06:12   DG Swallowing Func-Speech Pathology  Result Date: 05/04/2020 Objective Swallowing Evaluation: Type of Study: MBS-Modified Barium Swallow Study  Patient Details Name: Dean Dixon MRN: [id number] Date of Birth: April 30, 1951 Today's Date: 05/04/2020 Time: SLP Start Time (ACUTE ONLY): 0854 -SLP Stop Time (ACUTE ONLY): 0910 SLP Time Calculation (min) (ACUTE ONLY): 16 min  Past Medical History: Past Medical History: Diagnosis Date . Cancer ([place])   Mantle Cell Lymphoma . Diabetes mellitus without complication ([place])  . Hypertension  Past Surgical History: Past Surgical History: Procedure Laterality Date . APPENDECTOMY   . TONSILLECTOMY   HPI: Dean Dixon is a 69 y.o. male with medical history significant of mantle cell lymphoma on CHOP. Presenting with dyspnea and ab distention. Reports 2 weeks of shortness of breath that has worsened in the last 2 days. ETT 11/7-11/9.  Subjective: alert at bedside Assessment / Plan / Recommendation CHL IP CLINICAL IMPRESSIONS 05/04/2020 Clinical Impression Pt demonstrated oropharyngeal dysphagia with aspiration of nectar thick barium marked by delayed initiation and incompetent laryngeal closure. Barium aggregated in his valleculae and pyriform sinuses for approximately 4 seconds filling pyriforms and spilling to vocal cords prior to laryngeal closure. Subsequent trial nectar was aspirated with cough not clearing trachea. He coughed intermittently throughout without new penetrates/aspirates suspect from previous intrusion. Mild delay with honey thick with mild pharyngeal residue. Inconsistent suboptimal movement of hyoid anteriorally and superiorally. Pt clearly fatigued at onset of and during study with increased respirations and decreased endurance. Compensatory strategies not attempted given level of current ability to perform from medical standpoint. Mild lingual residue cleared with independent swallows. Recommend he initiate Dys 1 (puree), honey thick liquid cautiously with pills whole in puree, support for self feeding and rest breaks. Recommendations may be modified if there are changes to plan of care in terms of Palliative.          SLP Visit Diagnosis Dysphagia, oropharyngeal phase (R13.12) Attention and concentration deficit following -- Frontal lobe and executive function deficit following -- Impact on safety and function Moderate  aspiration risk   CHL IP TREATMENT RECOMMENDATION 05/04/2020 Treatment Recommendations Therapy  as outlined in treatment plan below   Prognosis 05/04/2020 Prognosis for Safe Diet Advancement Good Barriers to Reach Goals (No Data) Barriers/Prognosis Comment -- CHL IP DIET RECOMMENDATION 05/04/2020 SLP Diet Recommendations Honey thick liquids;Dysphagia 1 (Puree) solids Liquid Administration via Cup;No straw Medication Administration Whole meds with puree Compensations Slow rate;Small sips/bites;Other (Comment) Postural Changes Seated upright at 90 degrees   CHL IP OTHER RECOMMENDATIONS 05/04/2020 Recommended Consults -- Oral Care Recommendations Oral care BID Other Recommendations Order thickener from pharmacy   CHL IP FOLLOW UP RECOMMENDATIONS 05/04/2020 Follow up Recommendations Other (comment)   CHL IP FREQUENCY AND DURATION 05/04/2020 Speech Therapy Frequency (ACUTE ONLY) min 2x/week Treatment Duration 2 weeks      CHL IP ORAL PHASE 05/04/2020 Oral Phase Impaired Oral - Pudding Teaspoon -- Oral - Pudding Cup -- Oral - Honey Teaspoon -- Oral - Honey Cup Lingual/palatal residue Oral - Nectar Teaspoon -- Oral - Nectar Cup Lingual/palatal residue Oral - Nectar Straw -- Oral - Thin Teaspoon -- Oral - Thin Cup -- Oral - Thin Straw -- Oral - Puree -- Oral - Mech Soft -- Oral - Regular Lingual/palatal residue Oral - Multi-Consistency -- Oral - Pill -- Oral Phase - Comment --  CHL IP PHARYNGEAL PHASE 05/04/2020 Pharyngeal Phase Impaired Pharyngeal- Pudding Teaspoon -- Pharyngeal -- Pharyngeal- Pudding Cup -- Pharyngeal -- Pharyngeal- Honey Teaspoon -- Pharyngeal -- Pharyngeal- Honey Cup Delayed swallow initiation-vallecula;Pharyngeal residue - pyriform;Pharyngeal residue - valleculae Pharyngeal -- Pharyngeal- Nectar Teaspoon -- Pharyngeal -- Pharyngeal- Nectar Cup Delayed swallow initiation-pyriform sinuses;Penetration/Aspiration during swallow;Pharyngeal residue - pyriform Pharyngeal Material enters airway, remains ABOVE  vocal cords and not ejected out;Material enters airway, passes BELOW cords and not ejected out despite cough attempt by patient Pharyngeal- Nectar Straw -- Pharyngeal -- Pharyngeal- Thin Teaspoon -- Pharyngeal -- Pharyngeal- Thin Cup -- Pharyngeal -- Pharyngeal- Thin Straw -- Pharyngeal -- Pharyngeal- Puree -- Pharyngeal -- Pharyngeal- Mechanical Soft -- Pharyngeal -- Pharyngeal- Regular Pharyngeal residue - posterior pharnyx Pharyngeal -- Pharyngeal- Multi-consistency -- Pharyngeal -- Pharyngeal- Pill -- Pharyngeal -- Pharyngeal Comment --  CHL IP CERVICAL ESOPHAGEAL PHASE 05/04/2020 Cervical Esophageal Phase WFL Pudding Teaspoon -- Pudding Cup -- Honey Teaspoon -- Honey Cup -- Nectar Teaspoon -- Nectar Cup -- Nectar Straw -- Thin Teaspoon -- Thin Cup -- Thin Straw -- Puree -- Mechanical Soft -- Regular -- Multi-consistency -- Pill -- Cervical Esophageal Comment -- [place] Siren 05/04/2020, 10:56 AM Orbie Pyo Colvin Caroli.Ed Actor Pager [phone] Office [phone]              ECHOCARDIOGRAM COMPLETE  Result Date: 04/20/2020    ECHOCARDIOGRAM REPORT   Patient Name:   Dean Dixon Date of Exam: 04/20/2020 Medical Rec #:  [id number]    Height:       64.0 in Accession #:    [id number]   Weight:       210.1 lb Date of Birth:  Nov 08, 1950   BSA:          1.998 m Patient Age:    35 years     BP:           156/83 mmHg Patient Gender: M            HR:           88 bpm. Exam Location:  Inpatient Procedure: 2D Echo, Cardiac Doppler and Color Doppler Indications:    Chemo evaluation  History:        Patient has prior history of Echocardiogram examinations, most  recent 11/14/2019. COPD; Risk Factors:Hypertension and Diabetes.                 Mantle cell lymphoma.  Sonographer:    Dustin Flock Referring Phys: [place]  1. Left ventricular ejection fraction, by estimation, is 65 to 70%. The left ventricle has normal function. The left ventricle has  no regional wall motion abnormalities. Left ventricular diastolic parameters are consistent with Grade I diastolic dysfunction (impaired relaxation).  2. Right ventricular systolic function is normal. The right ventricular size is normal. Tricuspid regurgitation signal is inadequate for assessing PA pressure.  3. The mitral valve is grossly normal. Trivial mitral valve regurgitation. No evidence of mitral stenosis.  4. The aortic valve is tricuspid. There is mild calcification of the aortic valve. There is mild thickening of the aortic valve. Aortic valve regurgitation is not visualized. Mild aortic valve sclerosis is present, with no evidence of aortic valve stenosis.  5. The inferior vena cava is normal in size with greater than 50% respiratory variability, suggesting right atrial pressure of 3 mmHg. Comparison(s): Changes from prior study are noted. EF is now 65-70%. FINDINGS  Left Ventricle: Left ventricular ejection fraction, by estimation, is 65 to 70%. The left ventricle has normal function. The left ventricle has no regional wall motion abnormalities. The left ventricular internal cavity size was normal in size. There is  no left ventricular hypertrophy. Left ventricular diastolic parameters are consistent with Grade I diastolic dysfunction (impaired relaxation). Normal left ventricular filling pressure. Right Ventricle: The right ventricular size is normal. No increase in right ventricular wall thickness. Right ventricular systolic function is normal. Tricuspid regurgitation signal is inadequate for assessing PA pressure. Left Atrium: Left atrial size was normal in size. Right Atrium: Right atrial size was normal in size. Pericardium: Trivial pericardial effusion is present. Presence of pericardial fat pad. Mitral Valve: The mitral valve is grossly normal. Trivial mitral valve regurgitation. No evidence of mitral valve stenosis. Tricuspid Valve: The tricuspid valve is grossly normal. Tricuspid valve  regurgitation is not demonstrated. No evidence of tricuspid stenosis. Aortic Valve: The aortic valve is tricuspid. There is mild calcification of the aortic valve. There is mild thickening of the aortic valve. Aortic valve regurgitation is not visualized. Mild aortic valve sclerosis is present, with no evidence of aortic valve stenosis. Aortic valve mean gradient measures 11.1 mmHg. Aortic valve peak gradient measures 16.7 mmHg. Aortic valve area, by VTI measures 2.42 cm. Pulmonic Valve: The pulmonic valve was grossly normal. Pulmonic valve regurgitation is not visualized. No evidence of pulmonic stenosis. Aorta: The aortic root is normal in size and structure. Venous: The right upper pulmonary vein is normal. The inferior vena cava is normal in size with greater than 50% respiratory variability, suggesting right atrial pressure of 3 mmHg. IAS/Shunts: The atrial septum is grossly normal. Additional Comments: There is a small pleural effusion in the left lateral region.  LEFT VENTRICLE PLAX 2D LVIDd:         4.30 cm  Diastology LVIDs:         2.60 cm  LV e' medial:    8.81 cm/s LV PW:         1.10 cm  LV E/e' medial:  7.2 LV IVS:        1.10 cm  LV e' lateral:   7.51 cm/s LVOT diam:     2.30 cm  LV E/e' lateral: 8.4 LV SV:         92 LV  SV Index:   46 LVOT Area:     4.15 cm  RIGHT VENTRICLE RV Basal diam:  3.20 cm RV S prime:     11.00 cm/s TAPSE (M-mode): 2.9 cm LEFT ATRIUM             Index       RIGHT ATRIUM           Index LA diam:        3.30 cm 1.65 cm/m  RA Area:     15.30 cm LA Vol (A2C):   43.2 ml 21.62 ml/m RA Volume:   37.60 ml  18.82 ml/m LA Vol (A4C):   30.7 ml 15.37 ml/m LA Biplane Vol: 36.9 ml 18.47 ml/m  AORTIC VALVE AV Area (Vmax):    2.42 cm AV Area (Vmean):   2.14 cm AV Area (VTI):     2.42 cm AV Vmax:           204.20 cm/s AV Vmean:          159.222 cm/s AV VTI:            0.381 m AV Peak Grad:      16.7 mmHg AV Mean Grad:      11.1 mmHg LVOT Vmax:         119.00 cm/s LVOT Vmean:         82.200 cm/s LVOT VTI:          0.222 m LVOT/AV VTI ratio: 0.58  AORTA Ao Root diam: 3.10 cm MITRAL VALVE MV Area (PHT): 4.06 cm     SHUNTS MV Decel Time: 187 msec     Systemic VTI:  0.22 m MV E velocity: 63.40 cm/s   Systemic Diam: 2.30 cm MV A velocity: 104.00 cm/s MV E/A ratio:  0.61 Eleonore Chiquito MD Electronically signed by Eleonore Chiquito MD Signature Date/Time: 04/20/2020/10:32:25 AM    Final    [place] ASCITES (ABDOMEN LIMITED)  Result Date: 04/19/2020 CLINICAL DATA:  69 year old male with abdominal distension, ascites EXAM: LIMITED ABDOMEN ULTRASOUND FOR ASCITES TECHNIQUE: Limited ultrasound survey for ascites was performed in all four abdominal quadrants. COMPARISON:  CT abdomen/pelvis 04/19/2020 FINDINGS: Sonographic interrogation of the abdomen was performed in anticipation of paracentesis. Unfortunately, the volume is a ascites is quite small and insufficient for drainage. There is however extensive omental caking and peritoneal nodularity. IMPRESSION: 1. Small volume ascites, insufficient for paracentesis. 2. Extensive omental caking and peritoneal nodularity consistent with peritoneal carcinomatosis. Electronically Signed   By: Jacqulynn Cadet M.D.   On: 04/19/2020 16:53   VAS [place] LOWER EXTREMITY VENOUS (DVT)  Result Date: 05/01/2020  Lower Venous DVT Study Indications: Pain, Swelling, and Edema.  Risk Factors: Immobility confirmed PE. Performing Technologist: Griffin Basil RCT RDMS  Examination Guidelines: A complete evaluation includes B-mode imaging, spectral Doppler, color Doppler, and power Doppler as needed of all accessible portions of each vessel. Bilateral testing is considered an integral part of a complete examination. Limited examinations for reoccurring indications may be performed as noted. The reflux portion of the exam is performed with the patient in reverse Trendelenburg.  +---------+---------------+---------+-----------+----------+--------------+ RIGHT     CompressibilityPhasicitySpontaneityPropertiesThrombus Aging +---------+---------------+---------+-----------+----------+--------------+ CFV      Full           Yes      Yes                                 +---------+---------------+---------+-----------+----------+--------------+ SFJ  Full                                                        +---------+---------------+---------+-----------+----------+--------------+ FV Prox  Full                                                        +---------+---------------+---------+-----------+----------+--------------+ FV Mid   Full                                                        +---------+---------------+---------+-----------+----------+--------------+ FV DistalFull                                                        +---------+---------------+---------+-----------+----------+--------------+ PFV      Full                                                        +---------+---------------+---------+-----------+----------+--------------+ POP      Full           Yes      Yes                                 +---------+---------------+---------+-----------+----------+--------------+ PTV      Full                                                        +---------+---------------+---------+-----------+----------+--------------+ PERO     Full                                                        +---------+---------------+---------+-----------+----------+--------------+   +---------+---------------+---------+-----------+----------+--------------+ LEFT     CompressibilityPhasicitySpontaneityPropertiesThrombus Aging +---------+---------------+---------+-----------+----------+--------------+ CFV      Full           Yes      Yes                                 +---------+---------------+---------+-----------+----------+--------------+ SFJ      Full                                                         +---------+---------------+---------+-----------+----------+--------------+  FV Prox  Full                                                        +---------+---------------+---------+-----------+----------+--------------+ FV Mid   Full                                                        +---------+---------------+---------+-----------+----------+--------------+ FV DistalFull                                                        +---------+---------------+---------+-----------+----------+--------------+ PFV      Full                                                        +---------+---------------+---------+-----------+----------+--------------+ POP      Full           Yes      Yes                                 +---------+---------------+---------+-----------+----------+--------------+ PTV      Full                                                        +---------+---------------+---------+-----------+----------+--------------+ PERO     Full                                                        +---------+---------------+---------+-----------+----------+--------------+     Summary: RIGHT: - There is no evidence of deep vein thrombosis in the lower extremity.  - No cystic structure found in the popliteal fossa.  LEFT: - There is no evidence of deep vein thrombosis in the lower extremity.  - No cystic structure found in the popliteal fossa.  *See table(s) above for measurements and observations. Electronically signed by Curt Jews MD on 05/01/2020 at 2:50:45 PM.    Final    ECHOCARDIOGRAM LIMITED  Result Date: 04/30/2020    ECHOCARDIOGRAM LIMITED REPORT   Patient Name:   Dean Dixon Date of Exam: 04/30/2020 Medical Rec #:  [id number]    Height:       64.0 in Accession #:    [id number]   Weight:       205.0 lb Date of Birth:  1951/05/03   BSA:          1.977 m Patient Age:    25 years     BP:  114/60 mmHg Patient Gender: M             HR:           109 bpm. Exam Location:  Inpatient Procedure: Limited Echo, Limited Color Doppler and Cardiac Doppler Indications:    Pulmonary Embolus I26.99  History:        Patient has prior history of Echocardiogram examinations, most                 recent 04/20/2020. Mantle cell lymphoma; Risk                 Factors:Hypertension and Diabetes.  Sonographer:    Mikki Santee RDCS (AE) Referring Phys: [id number] Jonnie Finner  Sonographer Comments: Echo performed with patient supine and on artificial respirator. IMPRESSIONS  1. Cystic structure noted in liver on Subcostal images.  2. Left ventricular ejection fraction, by estimation, is 60 to 65%. The left ventricle has normal function. The left ventricle has no regional wall motion abnormalities.  3. Right ventricular systolic function is normal. The right ventricular size is normal.  4. The pericardial effusion is posterior to the left ventricle.  5. The mitral valve is normal in structure. No evidence of mitral valve regurgitation. No evidence of mitral stenosis.  6. The aortic valve is normal in structure. Aortic valve regurgitation is not visualized. No aortic stenosis is present.  7. The inferior vena cava is normal in size with greater than 50% respiratory variability, suggesting right atrial pressure of 3 mmHg. FINDINGS  Left Ventricle: Left ventricular ejection fraction, by estimation, is 60 to 65%. The left ventricle has normal function. The left ventricle has no regional wall motion abnormalities. The left ventricular internal cavity size was normal in size. There is  no left ventricular hypertrophy. Right Ventricle: The right ventricular size is normal. No increase in right ventricular wall thickness. Right ventricular systolic function is normal. Left Atrium: Left atrial size was normal in size. Right Atrium: Right atrial size was normal in size. Pericardium: Trivial pericardial effusion is present. The pericardial effusion is posterior to the left  ventricle. Mitral Valve: The mitral valve is normal in structure. No evidence of mitral valve stenosis. Tricuspid Valve: The tricuspid valve is normal in structure. Tricuspid valve regurgitation is mild . No evidence of tricuspid stenosis. Aortic Valve: The aortic valve is normal in structure. Aortic valve regurgitation is not visualized. No aortic stenosis is present. Pulmonic Valve: The pulmonic valve was normal in structure. Pulmonic valve regurgitation is trivial. No evidence of pulmonic stenosis. Aorta: The aortic root is normal in size and structure. Venous: The inferior vena cava is normal in size with greater than 50% respiratory variability, suggesting right atrial pressure of 3 mmHg. IAS/Shunts: No atrial level shunt detected by color flow Doppler. Additional Comments: Cystic structure noted in liver on Subcostal images. LEFT VENTRICLE PLAX 2D LVIDd:         4.20 cm LVIDs:         2.60 cm LV PW:         1.00 cm LV IVS:        1.10 cm LVOT diam:     2.30 cm LVOT Area:     4.15 cm  RIGHT VENTRICLE RV S prime:     30.80 cm/s TAPSE (M-mode): 1.8 cm LEFT ATRIUM         Index LA diam:    3.00 cm 1.52 cm/m   AORTA Ao Root diam: 3.40 cm  SHUNTS Systemic  Diam: 2.30 cm Jenkins Rouge MD Electronically signed by Jenkins Rouge MD Signature Date/Time: 04/30/2020/9:18:44 AM    Final    [place] EKG SITE RITE  Result Date: 05/08/2020 If Site Rite image not attached, placement could not be confirmed due to current cardiac rhythm.  [place] EKG SITE RITE  Result Date: 05/03/2020 If Site Rite image not attached, placement could not be confirmed due to current cardiac rhythm.  [place] EKG SITE RITE  Result Date: 04/19/2020 If Site Rite image not attached, placement could not be confirmed due to current cardiac rhythm.   ASSESSMENT AND PLAN: 69 yo male with   1) Stage IV Mantle cell cell lymphoma CLL FISH shows 17p mutation -04/26/2019 Flow Cytometry [phone]) revealed "Abnormal B-cell population identified. CD5+  clonal lymphoproliferative disorder" -04/26/2019 FISH revealed "Positive for p53 (17p13) deletion." -05/31/2019 PET/CT ([id number]) revealed "1. There are multiple prominent lymph nodes in the neck, both axilla, the retroperitoneum and pelvis with low level hypermetabolic activity consistent with chronic lymphocytic leukemia. Deauville 3 and 4. 2. Mild splenomegaly and mild generalized splenic hypermetabolic activity 3. No evidence of bone marrow involvement." -06/10/2019 Flow Pathology Report ([id number]) revealed "Abnormal B-cell population." -06/10/2019 Surgical Pathology Report ([id number])  revealed "Atypical lymphoid proliferation consistent with mantle cell lymphoma." -09/19/19 PET Skull Base to Thigh ([id number])-- favorable response to current treatment -01/02/2020 PET/CT ([id number]) revealed "1. Continued further decrease in size and FDG accumulation in index cervical, axillary, retroperitoneal, and pelvic lymph nodes. No lymphadenopathy by size criteria today. FDG accumulation in these lymph nodes is compatible with Deauville 2 category. 2. Interval decrease in size with resolution of hypermetabolism associated with the spleen previously."  -04/19/2020 CT abdomen pelvis revealed "1. Dominant finding is new large (15 cm) lymphoid mass within LEFT upper quadrant. Mass is new from PET-CT 01/02/2020. Differential includes massive mesenteric adenopathy versus lymphoma of the small bowel or descending colon. No oral or IV contrast. 2. New periaortic retroperitoneal lymphadenopathy. New precordial Adenopathy. 3. Extensive fluid within the mesentery and omental thickening in LEFT lower quadrant. 4. Free fluid the pelvis with thin enhancing rim suggest peritonitis. 5. No evidence of bowel perforation. No evidence of bowel Obstruction." -04/24/2020 CTA chest and CT abdomen pelvis revealed "1. Study is positive for segmental sized pulmonary emboli in the left lung. These appear nonocclusive at this  time, and there is no associated pulmonary hemorrhage to suggest frank pulmonary infarct. 2. Bulky soft tissue mass in the left upper quadrant with extensive lymphadenopathy in the chest and abdomen, and evidence of widespread intraperitoneal metastatic disease, as detailed above. 3. Small bilateral pleural effusions lying dependently. 4. Aortic atherosclerosis. 5. Tracheobronchomalacia. 6. Cholelithiasis. 7. Colonic diverticulosis."  2) Rigors from Rituxan - needing demerol as premedication. Rituxan rate to be capped at 171m/hour  3) Thrombocytopenia from chemotherapy  4) Neutropenia due to recent chemotherapy  5) anemia  6) significant new abdominal distention due to lymphoma recurrence  7) new hypercalcemia- ?  Related to dehydration and  Lymphoma -resolved at this time  8) acute hypoxic respiratory failure-multifactorial due to abdominal distention, PE, and hypertensive emergency  9) left segmental PE  10) AKI  11) tumor lysis syndrome  12) pancytopenia related to chemotherapy and possible sepsis.  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  He received 1 cycle of CHOP with no improvement in his left upper quadrant soft tissue mass and abdominal distention at this time. -Undergoing radiation to his left upper quadrant soft tissue mass.  Due to complete on 11/24. -Plan to begin acalabrutinib with clinical status improves and his counts improve.  Continuing to hold off at this time. -The patient has received G-CSF already and recommend close monitoring of his CBC with differential.  - Transfuse for hemoglobin less than 8 and platelets less than 30,000 or active bleeding. -GI evaluation has been completed.  Anticoagulation was resumed. Continue heparin for now. When platelet count >50,000, can transition to Lovenox 1 mg/kg BID.  -Received rasburicase on 05/01/2020 and 05/03/2020 for hyperuricemia. Uric acid level has  normalized. Continue allopurinol. -Monitor renal function closely and consider nephrology consult if worsening.   -Recommend PT/OT consult.   LOS: 11 days   Mikey Bussing    ADDENDUM  .Patient was Personally and independently interviewed, examined and relevant elements of the history of present illness were reviewed in details and an assessment and plan was created. All elements of the patient's history of present illness , assessment and plan were discussed in details with Mikey Bussing . The above documentation reflects our combined findings assessment and plan.  Sullivan Lone MD MS

## 2020-05-10 NOTE — TOC Progression Note (Signed)
Transition of Care [facility]) - Progression Note    Patient Details  Name: Dean Dixon MRN: [id number] Date of Birth: [birth year]  Transition of Care [facility]) CM/SW Contact  Leeroy Cha, RN Phone Number: 05/10/2020, 8:10 AM  Clinical Narrative:    Patient with metastatic mantle cell lymphoma with large soft tissue mass in the left upper quadrant of the abdomen associated with multiple bowel loops. Patient with extensive omental caking. Patient mantle cell lymphoma resistant to chemotherapy. Patient with tumor lysis syndrome. Patient now with PE. Patient with aspiration and was on a dysphagia 1 diet. Patient with a poor prognosis. Patient being followed by palliative care Plan undetermined at this time Follow for toc needs-[facility]? Versus home with self care  Expected Discharge Plan: Home/Self Care Barriers to Discharge: No Barriers Identified  Expected Discharge Plan and Services Expected Discharge Plan: Home/Self Care   Discharge Planning Services: CM Consult   Living arrangements for the past 2 months: Single Family Home                                       Social Determinants of Health (SDOH) Interventions    Readmission Risk Interventions No flowsheet data found.

## 2020-05-10 NOTE — Progress Notes (Signed)
OT Cancellation Note  Patient Details Name: Dean Dixon MRN: [id number] DOB: Jun 19, 1951   Cancelled Treatment:    Reason Eval/Treat Not Completed: Fatigue/lethargy limiting ability to participate. Patient worked with PT earlier today, sleeping heavily upon arrival. Will re-attempt 11/19 as schedule permits.  Delbert Phenix OT OT pager: [place] 05/10/2020, 1:21 PM

## 2020-05-10 NOTE — Progress Notes (Addendum)
PROGRESS NOTE    Dean Dixon  IOE:[id number] DOB: [birth year] DOA: 04/25/2020 PCP: Dean Sake, MD   Chief Complaint  Patient presents with  . Groin Swelling  . Shortness of Breath  . Emesis    Brief Narrative:   69 year old male with stage IV mantle cell lymphoma on CHOP presenting with N/V and progressive SOB over the last 2 weeks found to have acute left segmental PE. Admitted to [facility] however developed hypertension 250/100 with respiratory distress with chest pain, unable to tolerate BiPAP and was intubated.  He's been transferred back to [facility].  See previous progress notes and H&P for additional details regarding history.   Significant events 11/07 Admitted to [facility]/ decompensated/ intubated 11/08 Weaned on PSV, lasix with 2.2L UOP  11/09 Extubated  11/11 Fever / leukocytosis improved, on abx  11/12 Mental status improved, on RA, palliative radiation   Assessment & Plan:   Active Problems:   Mantle cell lymphoma (HCC)   Acute on chronic respiratory failure with hypoxia (HCC)   Pulmonary embolism (HCC)   Shortness of breath   Dyspnea and respiratory abnormalities   Intubation of airway performed without difficulty   Pressure injury of skin   Abdominal distension   Tumor lysis syndrome   Antineoplastic chemotherapy induced pancytopenia (HCC)   Tumor lysis syndrome following antineoplastic drug therapy   Neutropenia with fever (HCC)   Acute renal failure (HCC)   Hypernatremia   SOB (shortness of breath)  1 acute hypoxemic respiratory failure  Segmental Left Sided Pulmonary Embolism  Volume overload  Extubated on 11/9 Currently on room air  Continue heparin for PE - per oncology, continue heparin until platelets >50,000, then can transition to lovenox 1 mg/kg BID (transfuse platelets for <30,000 and Hb <8) Strict I/O, daily weights SOB 11/16 with concern for volume overload in setting of pRBC transfusion on 11/15 -> s/p lasix - continued edema today, continue  lasix, follow volume status/renal function - UA without proteinuria, normal LFT's, albumin 2.7 Echo 11/9 with EF 60-65%, no RWMA (see report) CXR 11/18 with bibasilar atelectasis with increasing medial R basilar opacity - atelectasis vs infection (afebrile, will continue to monitor off abx for now) Repeat CXR 11/19  # metastatic mantle cell lymphoma on chemotherapy  Pancytopenia  Tumor Lysis Syndrome Appreciate oncology assistance - Plan to begin acalabrutinib after clinical improvement Receiving radiation to LUQ soft tissue mass S/p 1 cycle of CHOP S/p G-CSF Transfuse for hemoglobin <8 and platelets <30,000  --- s/p 4 units pRBC during hospitalization  S/p rasburicase for hyperuricemia, continue allopurinol   2.  Acute renal failure Baseline creatinine is around 1 Creatinine peaked at 3.1 Stable around 1.71 today, fluctuating, increasing slightly with lasix yesterday No hydroureteronephrosis on imaging from 05/17/2020 Follow with intermittent lasix for volume overload  3.  Hypernatremia Continue D5W at 50 cc/h.    4.  Acute left segmental PE In the setting of metastatic mantle cell lymphoma Lower extremity Dopplers negative for DVT 2D echo with no right ventricular strain Patient assessed by GI and felt patient did not have [name] melanotic stools all GI bleed and as such heparin resumed 05/07/2020 Hematology following, appreciate assistance, continue lovenox until platelets >50,000 then transition to lovenox  5.  Fever/leukopenia  Blood cx 11/9 NGTD Urine cx NG CXR without acute infiltrates Covid negative, flu negative S/p IV abx CXR as noted above, follow repeat CXR 11/19  7.  Concern for melanotic stools ruled out Concern for GI bleed.  Patient  was assessed by GI and was felt patient did not have melanotic stools and as such anticoagulation resumed.  Change IV PPI to once daily PO.  See GI note from 11/15.  10.  Hypokalemia Replace and follow   11.  Prognosis Patient  with metastatic mantle cell lymphoma with large soft tissue mass in the left upper quadrant of the abdomen associated with multiple bowel loops. Patient with extensive omental caking.  Patient mantle cell lymphoma resistant to chemotherapy.  Patient with tumor lysis syndrome.  Patient now with PE. Patient with aspiration and was on [name] dysphagia 1 diet.  Patient with [name] poor prognosis.  Patient being followed by palliative care.  Oncology following.  Appreciate assistance.  DVT prophylaxis: heparin Code Status: full  Family Communication: none at bedside - friend Nicole Kindred Disposition:   Status is: Inpatient  Remains inpatient appropriate because:Inpatient level of care appropriate due to severity of illness   Dispo: The patient is from: Home              Anticipated d/c is to: pending CIR? SNF?              Anticipated d/c date is: > 3 days              Patient currently is not medically stable to d/c.   Consultants:   GI  Palliative  Oncology  PCCM  Procedures: Echo IMPRESSIONS    1. Cystic structure noted in liver on Subcostal images.  2. Left ventricular ejection fraction, by estimation, is 60 to 65%. The  left ventricle has normal function. The left ventricle has no regional  wall motion abnormalities.  3. Right ventricular systolic function is normal. The right ventricular  size is normal.  4. The pericardial effusion is posterior to the left ventricle.  5. The mitral valve is normal in structure. No evidence of mitral valve  regurgitation. No evidence of mitral stenosis.  6. The aortic valve is normal in structure. Aortic valve regurgitation is  not visualized. No aortic stenosis is present.  7. The inferior vena cava is normal in size with greater than 50%  respiratory variability, suggesting right atrial pressure of 3 mmHg.   LE [place] Summary:  RIGHT:  - There is no evidence of deep vein thrombosis in the lower extremity.    - No cystic structure found in  the popliteal fossa.    LEFT:  - There is no evidence of deep vein thrombosis in the lower extremity.    - No cystic structure found in the popliteal fossa.  Antimicrobials:  Anti-infectives (From admission, onward)   Start     Dose/Rate Route Frequency Ordered Stop   05/03/20 1400  ceFEPIme (MAXIPIME) 2 g in sodium chloride 0.9 % 100 mL IVPB        2 g 200 mL/hr over 30 Minutes Intravenous Every 12 hours 05/03/20 1340 05/06/20 1437   05/02/20 2200  ceFEPIme (MAXIPIME) 2 g in sodium chloride 0.9 % 100 mL IVPB  Status:  Discontinued        2 g 200 mL/hr over 30 Minutes Intravenous Every 24 hours 05/02/20 0800 05/03/20 1340   04/30/20 1200  ceFEPIme (MAXIPIME) 2 g in sodium chloride 0.9 % 100 mL IVPB  Status:  Discontinued        2 g 200 mL/hr over 30 Minutes Intravenous Every 12 hours 04/30/20 1025 05/02/20 0800      Subjective: No new complaints today Getting some cabin fever  Objective:  Vitals:   05/10/20 0500 05/10/20 0700 05/10/20 0800 05/10/20 0900  BP:   125/74   Pulse:   (!) 111   Resp:  (!) 24 (!) 21 (!) 23  Temp:   98.7 F (37.1 C)   TempSrc:   Axillary   SpO2:   99%   Weight: 104.1 kg     Height:        Intake/Output Summary (Last 24 hours) at 05/10/2020 0936 Last data filed at 05/10/2020 0800 Gross per 24 hour  Intake 1282.84 ml  Output 1400 ml  Net -117.16 ml   Filed Weights   05/08/20 0438 05/09/20 0419 05/10/20 0500  Weight: 95.7 kg 96 kg 104.1 kg    Examination:  General: No acute distress. Cardiovascular: Heart sounds show Cinde Ebert regular rate, and rhythm Lungs: Clear to auscultation bilaterally  Abdomen: Soft, nontender, nondistended  Neurological: Alert and oriented 3. Moves all extremities 4. Cranial nerves II through XII grossly intact. Skin: Warm and dry. No rashes or lesions. Extremities: bilateral LE edema    Data Reviewed: I have personally reviewed following labs and imaging studies  CBC: Recent Labs  Lab 05/06/20 0500  05/07/20 0551 05/08/20 0300 05/09/20 0427 05/10/20 0500  WBC 2.9* 2.6* 3.1* 3.1* 3.1*  NEUTROABS  --  1.8 2.4 2.4 2.4  HGB 7.9* 7.5* 11.2* 10.6* 10.8*  HCT 25.3* 23.8* 34.9* 32.8* 33.3*  MCV 95.1 96.0 91.8 92.1 92.2  PLT 34* 36* 36* 39* 45*    Basic Metabolic Panel: Recent Labs  Lab 05/06/20 0500 05/07/20 0551 05/08/20 0300 05/09/20 0427 05/10/20 0500  NA 148* 148* 148* 146* 146*  K 3.2* 3.0* 3.6 3.0* 3.2*  CL 116* 117* 117* 116* 112*  CO2 22 22 21* 21* 22  GLUCOSE 118* 106* 97 74 107*  BUN 71* 62* 56* 52* 46*  CREATININE 1.44* 1.48* 1.57* 1.68* 1.71*  CALCIUM 7.0* 7.1* 7.2* 7.3* 7.6*  MG  --  2.5*  --   --  2.1  PHOS  --   --   --   --  2.5    GFR: Estimated Creatinine Clearance: 43.9 mL/min (Bowden Boody) (by C-G formula based on SCr of 1.71 mg/dL (H)).  Liver Function Tests: Recent Labs  Lab 05/06/20 0500 05/07/20 0551 05/08/20 0300 05/09/20 0427 05/10/20 0500  AST 20 19 21 18 16   ALT 15 14 15 14 12   ALKPHOS 65 63 69 67 66  BILITOT 0.5 0.5 0.7 0.6 0.5  PROT 5.2* 5.1* 5.3* 5.3* 5.2*  ALBUMIN 2.7* 2.5* 2.8* 2.7* 2.7*    CBG: Recent Labs  Lab 05/09/20 1226 05/09/20 1543 05/09/20 1936 05/09/20 2310 05/10/20 0737  GLUCAP 154* 121* 143* 153* 137*     Recent Results (from the past 240 hour(s))  Culture, blood (Routine X 2) w Reflex to ID Panel     Status: None   Collection Time: 05/01/20  8:05 AM   Specimen: BLOOD LEFT HAND  Result Value Ref Range Status   Specimen Description   Final    BLOOD LEFT HAND Performed at [facility], [place] [address], [place], [place] [address]    Special Requests   Final    BOTTLES DRAWN AEROBIC ONLY Blood Culture adequate volume Performed at [place] [address], [place], [place] [address]    Culture   Final    NO GROWTH 5 DAYS Performed at [facility], [place] [address] [place], [place] [address]    Report Status 05/06/2020  FINAL  Final  Culture, blood (Routine X 2) w  Reflex to ID Panel     Status: None   Collection Time: 05/01/20  8:11 AM   Specimen: BLOOD LEFT HAND  Result Value Ref Range Status   Specimen Description   Final    BLOOD LEFT HAND Performed at [place] [address], [place], [place] [address]    Special Requests   Final    BOTTLES DRAWN AEROBIC ONLY Blood Culture results may not be optimal due to an inadequate volume of blood received in culture bottles Performed at [place] [address], [place], [place] [address]    Culture   Final    NO GROWTH 5 DAYS Performed at [facility], [place] [address] [place], [place] [address]    Report Status 05/06/2020 FINAL  Final  Culture, Urine     Status: None   Collection Time: 05/05/20  7:48 AM   Specimen: Urine, Random  Result Value Ref Range Status   Specimen Description   Final    URINE, RANDOM Performed at [place] [address] [place], [place] [address]    Special Requests   Final    URINE, RANDOM Performed at [place] [address], [place], [place] [address]    Culture   Final    NO GROWTH Performed at [facility], [place] [address], [place], [place] [address]    Report Status 05/06/2020 FINAL  Final         Radiology Studies: DG CHEST PORT 1 VIEW  Result Date: 05/10/2020 CLINICAL DATA:  Shortness of breath. Mantle cell lymphoma on chemotherapy EXAM: PORTABLE CHEST 1 VIEW COMPARISON:  05/08/2020 FINDINGS: Interval removal of right IJ approach central venous catheter. Right-sided PICC line remains in place terminating at the level of the distal SVC. Stable cardiomediastinal contours. Low lung volumes. Bibasilar atelectasis with increasing medial right basilar opacity. No appreciable pleural fluid collection. No pneumothorax. IMPRESSION: Bibasilar atelectasis with increasing medial right basilar opacity, atelectasis versus infection. Electronically Signed   By:  Davina Poke D.O.   On: 05/10/2020 08:25   DG CHEST PORT 1 VIEW  Result Date: 05/08/2020 CLINICAL DATA:  69 year old male status post PICC placement. EXAM: PORTABLE CHEST 1 VIEW COMPARISON:  Chest radiograph dated 05/08/2020. FINDINGS: Right IJ central venous line in similar position. There has been interval placement of Naika Noto right-sided PICC with tip over central SVC close to the cavoatrial junction. There is shallow inspiration with bibasilar atelectasis. No significant interval change in the appearance of the lungs compared to the earlier radiograph. No large pleural effusion or pneumothorax. Stable cardiac silhouette. No acute osseous pathology. IMPRESSION: Interval placement of Karianne Nogueira right-sided PICC with tip over central SVC close to the cavoatrial junction. Electronically Signed   By: Anner Crete M.D.   On: 05/08/2020 16:44   DG CHEST PORT 1 VIEW  Result Date: 05/08/2020 CLINICAL DATA:  69 year old male with Kyren Vaux history of shortness of breath EXAM: PORTABLE CHEST 1 VIEW COMPARISON:  05/06/2020 FINDINGS: Cardiomediastinal silhouette unchanged in size and contour. Low lung volumes persist. Patchy opacities at the lung bases persist. No pneumothorax or pleural effusion. No confluent airspace disease. Unchanged right IJ central venous catheter. No acute displaced fracture IMPRESSION: Unchanged appearance of the chest x-ray with low lung volumes and basilar atelectasis. Right IJ central venous catheter unchanged Electronically Signed   By: Corrie Mckusick D.O.   On: 05/08/2020 13:13  Scheduled Meds: . allopurinol  100 mg Oral BID  . amLODipine  10 mg Per Tube Daily  . bisacodyl  10 mg Rectal Once  . Chlorhexidine Gluconate Cloth  6 each Topical Daily  . collagenase   Topical Daily  . feeding supplement  237 mL Oral TID BM  . insulin aspart  0-15 Units Subcutaneous Q4H  . mouth rinse  15 mL Mouth Rinse BID  . multivitamin with minerals  1 tablet Oral Daily  . pantoprazole (PROTONIX)  IV  40 mg Intravenous Q12H  . polyethylene glycol  17 g Oral BID  . potassium chloride  40 mEq Oral Q4H  . sodium chloride flush  10-40 mL Intracatheter Q12H  . sodium chloride flush  10-40 mL Intracatheter Q12H   Continuous Infusions: . dextrose 50 mL/hr at 05/10/20 0800  . heparin 1,100 Units/hr (05/10/20 0800)     LOS: 11 days    Time spent: over 30 min    Fayrene Helper, MD [facility]   To contact the attending provider between 7A-7P or the covering provider during after hours 7P-7A, please log into the web site [email or web address] and access using universal [place] password for that web site. If you do not have the password, please call the hospital operator.  05/10/2020, 9:36 AM

## 2020-05-11 ENCOUNTER — Inpatient Hospital Stay (HOSPITAL_COMMUNITY): Payer: Medicare Other

## 2020-05-11 ENCOUNTER — Ambulatory Visit
Admit: 2020-05-11 | Discharge: 2020-05-11 | Disposition: A | Discharge status: Home / Self Care | Payer: Medicare Other | Attending: Radiation Oncology | Admitting: Radiation Oncology

## 2020-05-11 DIAGNOSIS — C8310 Mantle cell lymphoma, unspecified site: Secondary | ICD-10-CM | POA: Diagnosis not present

## 2020-05-11 DIAGNOSIS — R140 Abdominal distension (gaseous): Secondary | ICD-10-CM | POA: Diagnosis not present

## 2020-05-11 DIAGNOSIS — C8318 Mantle cell lymphoma, lymph nodes of multiple sites: Secondary | ICD-10-CM | POA: Diagnosis not present

## 2020-05-11 DIAGNOSIS — R1030 Lower abdominal pain, unspecified: Secondary | ICD-10-CM | POA: Diagnosis not present

## 2020-05-11 DIAGNOSIS — I2699 Other pulmonary embolism without acute cor pulmonale: Secondary | ICD-10-CM | POA: Diagnosis not present

## 2020-05-11 LAB — COMPREHENSIVE METABOLIC PANEL
ALT: 12 U/L (ref 0–44)
AST: 16 U/L (ref 15–41)
Albumin: 2.6 g/dL — ABNORMAL LOW (ref 3.5–5.0)
Alkaline Phosphatase: 69 U/L (ref 38–126)
Anion gap: 10 (ref 5–15)
BUN: 48 mg/dL — ABNORMAL HIGH (ref 8–23)
CO2: 21 mmol/L — ABNORMAL LOW (ref 22–32)
Calcium: 7.8 mg/dL — ABNORMAL LOW (ref 8.9–10.3)
Chloride: 116 mmol/L — ABNORMAL HIGH (ref 98–111)
Creatinine, Ser: 1.79 mg/dL — ABNORMAL HIGH (ref 0.61–1.24)
GFR, Estimated: 40 mL/min — ABNORMAL LOW (ref >60)
Glucose, Bld: 122 mg/dL — ABNORMAL HIGH (ref 70–99)
Potassium: 4.2 mmol/L (ref 3.5–5.1)
Sodium: 147 mmol/L — ABNORMAL HIGH (ref 135–145)
Total Bilirubin: 0.6 mg/dL (ref 0.3–1.2)
Total Protein: 4.9 g/dL — ABNORMAL LOW (ref 6.5–8.1)

## 2020-05-11 LAB — CBC WITH DIFFERENTIAL/PLATELET
Abs Immature Granulocytes: 0.08 10*3/uL — ABNORMAL HIGH (ref 0.00–0.07)
Basophils Absolute: 0 10*3/uL (ref 0.0–0.1)
Basophils Relative: 1 %
Eosinophils Absolute: 0 10*3/uL (ref 0.0–0.5)
Eosinophils Relative: 0 %
HCT: 32.7 % — ABNORMAL LOW (ref 39.0–52.0)
Hemoglobin: 10.2 g/dL — ABNORMAL LOW (ref 13.0–17.0)
Immature Granulocytes: 3 %
Lymphocytes Relative: 10 %
Lymphs Abs: 0.3 10*3/uL — ABNORMAL LOW (ref 0.7–4.0)
MCH: 29.5 pg (ref 26.0–34.0)
MCHC: 31.2 g/dL (ref 30.0–36.0)
MCV: 94.5 fL (ref 80.0–100.0)
Monocytes Absolute: 0.4 10*3/uL (ref 0.1–1.0)
Monocytes Relative: 13 %
Neutro Abs: 2 10*3/uL (ref 1.7–7.7)
Neutrophils Relative %: 73 %
Platelets: 51 10*3/uL — ABNORMAL LOW (ref 150–400)
RBC: 3.46 MIL/uL — ABNORMAL LOW (ref 4.22–5.81)
RDW: 18.4 % — ABNORMAL HIGH (ref 11.5–15.5)
WBC: 2.7 10*3/uL — ABNORMAL LOW (ref 4.0–10.5)
nRBC: 0 % (ref 0.0–0.2)

## 2020-05-11 LAB — HEPARIN LEVEL (UNFRACTIONATED): Heparin Unfractionated: 0.44 IU/mL (ref 0.30–0.70)

## 2020-05-11 LAB — BRAIN NATRIURETIC PEPTIDE: B Natriuretic Peptide: 62.8 pg/mL (ref 0.0–100.0)

## 2020-05-11 LAB — GLUCOSE, CAPILLARY
Glucose-Capillary: 117 mg/dL — ABNORMAL HIGH (ref 70–99)
Glucose-Capillary: 111 mg/dL — ABNORMAL HIGH (ref 70–99)
Glucose-Capillary: 157 mg/dL — ABNORMAL HIGH (ref 70–99)
Glucose-Capillary: 139 mg/dL — ABNORMAL HIGH (ref 70–99)
Glucose-Capillary: 143 mg/dL — ABNORMAL HIGH (ref 70–99)

## 2020-05-11 LAB — HEMOGLOBIN AND HEMATOCRIT, BLOOD
HCT: 30.7 % — ABNORMAL LOW (ref 39.0–52.0)
Hemoglobin: 9.9 g/dL — ABNORMAL LOW (ref 13.0–17.0)

## 2020-05-11 LAB — MAGNESIUM: Magnesium: 2.2 mg/dL (ref 1.7–2.4)

## 2020-05-11 LAB — URIC ACID: Uric Acid, Serum: 3.4 mg/dL — ABNORMAL LOW (ref 3.7–8.6)

## 2020-05-11 LAB — PHOSPHORUS: Phosphorus: 3 mg/dL (ref 2.5–4.6)

## 2020-05-11 MED ORDER — LEVALBUTEROL HCL 0.63 MG/3ML IN NEBU
0.6300 mg | INHALATION_SOLUTION | Freq: Three times a day (TID) | RESPIRATORY_TRACT | Status: AC | PRN
  Administered 2020-05-16 (×2): 0.63 mg via RESPIRATORY_TRACT
  Filled 2020-05-11 (×2): qty 3

## 2020-05-11 MED ORDER — FUROSEMIDE 10 MG/ML IJ SOLN
40.0000 mg | Freq: Once | INTRAMUSCULAR | Status: AC
  Administered 2020-05-11: 40 mg via INTRAVENOUS
  Filled 2020-05-11: qty 4

## 2020-05-11 MED ORDER — DEXTROSE 5 % IV SOLN: INTRAVENOUS | Status: DC

## 2020-05-11 MED ORDER — PANTOPRAZOLE SODIUM 40 MG IV SOLR
40.0000 mg | Freq: Two times a day (BID) | INTRAVENOUS | Status: DC
  Administered 2020-05-11 – 2020-05-19 (×17): 40 mg via INTRAVENOUS
  Filled 2020-05-11 (×17): qty 40

## 2020-05-11 NOTE — Consult Note (Signed)
   [facility] CM Inpatient Consult   05/11/2020  Ceylon Arenson 24-Aug-1950 [id number]   Patient chart has been reviewed due to readmissions less than 30 days and for high risk score for unplanned readmissions.  Patient assessed for community [facility] CM) follow up needs.  Chart review reveals current recommendation is for SNF or CIR with palliative following. No [facility] CM needs.  Netta Cedars, MSN, [facility] Liaison Nurse Mobile Phone [phone]  Toll free office [phone]

## 2020-05-11 NOTE — Progress Notes (Signed)
ANTICOAGULATION CONSULT NOTE - Follow Up Consult  Pharmacy Consult for Heparin Indication: pulmonary embolus  Allergies  Allergen Reactions  . Ivp Dye [Iodinated Diagnostic Agents] Shortness Of Breath    "allergic to CT scan dye", took benadryl with last scan and tolerated well    Patient Measurements: Height: 5\' 4"  (162.6 cm) Weight: 97.6 kg (215 lb 2.7 oz) IBW/kg (Calculated) : 59.2 Heparin Dosing Weight: 81 kg  Vital Signs: Temp: 98.3 F (36.8 C) (11/19 0435) Temp Source: Oral (11/19 0435) BP: 122/72 (11/19 0435) Pulse Rate: 103 (11/19 0435)  Labs: Recent Labs    05/09/20 0427 05/09/20 0427 05/10/20 0324 05/10/20 0500 05/11/20 0723  HGB 10.6*   < >  --  10.8* 10.2*  HCT 32.8*  --   --  33.3* 32.7*  PLT 39*  --   --  45* 51*  HEPARINUNFRC 0.55  --  0.36  --  0.44  CREATININE 1.68*  --   --  1.71* 1.79*   < > = values in this interval not displayed.    Estimated Creatinine Clearance: 40.5 mL/min (A) (by C-G formula based on SCr of 1.79 mg/dL (H)).   Medications:  Infusions:  . heparin 1,100 Units/hr (05/10/20 1800)    Assessment: Patient is a 69 y.o M with stage IV mantle cell lymphoma on chemotherapy (got CHOP on 04/21/20), presented to the ED on 11/7 with c/o SOB, groin swelling and emesis.  Chest CT showed acute PE.  Pharmacy is consulted to start heparin drip for VTE.  Significant Events: - Heparin stopped 11/14 PM for melenic stool, FOB + - GI consulted and found stool bag not melenic and is ok with resuming IV UFH.  Heparin resumed 11/15 after transfusion completed  Today, 05/11/2020:  HL 0.44, therapeutic on heparin 1100 units/hr  CBC:  Hgb 10.2, Plt up to 51k  No bleeding, complications, or interrupted noted  Goal of Therapy:  Heparin level 0.3-0.7 units/ml Monitor platelets by anticoagulation protocol: Yes   Plan:   Continue heparin IV infusion at 1100 units/hr  Daily heparin level and CBC  Continue to monitor s/s bleeding.   F/u  for possible transition to [place], Student-PharmD 05/11/2020, 9:41 AM

## 2020-05-11 NOTE — Progress Notes (Addendum)
HEMATOLOGY-ONCOLOGY PROGRESS NOTE  SUBJECTIVE:  Resting quietly this morning.  No bleeding noted.  Offers no complaints.  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 ([place])   06/29/2019 - 10/20/2019 Chemotherapy   The patient had dexamethasone (DECADRON) 4 MG tablet, 8 mg, Oral, Daily, 1 of 1 cycle, Start date: 06/20/2019, End date: 11/17/2019 palonosetron (ALOXI) injection 0.25 mg, 0.25 mg, Intravenous,  Once, 5 of 5 cycles Administration: 0.25 mg (07/27/2019), 0.25 mg (06/30/2019), 0.25 mg (08/24/2019), 0.25 mg (10/20/2019), 0.25 mg (09/21/2019) pegfilgrastim (NEULASTA ONPRO KIT) injection 6 mg, 6 mg, Subcutaneous, Once, 3 of 3 cycles Administration: 6 mg (08/25/2019), 6 mg (09/22/2019) bendamustine (BENDEKA) 200 mg in sodium chloride 0.9 % 50 mL (3.4483 mg/mL) chemo infusion, 90 mg/m2 = 200 mg, Intravenous,  Once, 5 of 5 cycles Administration: 200 mg (06/29/2019), 200 mg (06/30/2019), 200 mg (07/27/2019), 200 mg (07/28/2019), 200 mg (08/24/2019), 200 mg (08/25/2019), 200 mg (09/21/2019), 200 mg (09/22/2019) riTUXimab-pvvr (RUXIENCE) 800 mg in sodium chloride 0.9 % 250 mL (2.4242 mg/mL) infusion, 375 mg/m2 = 800 mg, Intravenous,  Once, 5 of 5 cycles Dose modification: 375 mg/m2 (original dose 375 mg/m2, Cycle 2), 400 mg (original dose 375 mg/m2, Cycle 2, Reason: Other (see comments), Comment: finishing rituximab from previous day, infusion not completed), 300 mg (original dose 300 mg, Cycle 4) Administration: 800 mg (06/29/2019), 800 mg (07/27/2019), 800 mg (08/24/2019), 400 mg (07/28/2019), 800 mg (10/20/2019), 800 mg (09/21/2019), 300 mg (09/22/2019)  for chemotherapy treatment.    04/20/2020 -  Chemotherapy   The patient had DOXOrubicin (ADRIAMYCIN) chemo injection 100 mg, 48 mg/m2 = 104 mg, Intravenous,  Once, 1 of 8 cycles Administration: 100 mg (04/21/2020) palonosetron (ALOXI) injection 0.25 mg, 0.25 mg, Intravenous,  Once, 1 of 8  cycles Administration: 0.25 mg (04/21/2020) pegfilgrastim-cbqv (UDENYCA) injection 6 mg, 6 mg, Subcutaneous, Once, 1 of 1 cycle Administration: 6 mg (04/24/2020) vinCRIStine (ONCOVIN) 2 mg in sodium chloride 0.9 % 50 mL chemo infusion, 2 mg, Intravenous,  Once, 1 of 8 cycles Administration: 2 mg (04/21/2020) cyclophosphamide (CYTOXAN) 1,560 mg in sodium chloride 0.9 % 250 mL chemo infusion, 750 mg/m2 = 1,560 mg, Intravenous,  Once, 1 of 8 cycles Administration: 1,560 mg (04/21/2020) fosaprepitant (EMEND) 150 mg in sodium chloride 0.9 % 145 mL IVPB, 150 mg, Intravenous,  Once, 1 of 8 cycles Administration: 150 mg (04/21/2020)  for chemotherapy treatment.       REVIEW OF SYSTEMS:   Negative except as noted in the HPI.  PHYSICAL EXAMINATION: ECOG PERFORMANCE STATUS: 3 - Symptomatic, >50% confined to bed  Vitals:   05/11/20 0039 05/11/20 0435  BP: 118/68 122/72  Pulse: (!) 103 (!) 103  Resp: 15 15  Temp: 98.1 F (36.7 C) 98.3 F (36.8 C)  SpO2: 98% 99%   Filed Weights   05/09/20 0419 05/10/20 0500 05/10/20 2038  Weight: 96 kg 104.1 kg 97.6 kg    Intake/Output from previous day: 11/18 0701 - 11/19 0700 In: 756.2 [P.O.:1; I.V.:755.2] Out: -   GENERAL: Awake, alert, no distress SKIN: skin color, texture, turgor are normal, no rashes or significant lesions EYES: normal, Conjunctiva are pink and non-injected, sclera clear LUNGS: Diminished HEART: regular rate & rhythm and no murmurs, 1+ bilateral lower extremity edema ABDOMEN: Positive bowel sounds, distention somewhat improved, no tenderness NEURO: Alert and oriented x3.  Has intermittent periods of confusion per nursing.  LABORATORY DATA:  I have reviewed the data  as listed CMP Latest Ref Rng & Units 05/11/2020 05/10/2020 05/09/2020  Glucose 70 - 99 mg/dL 122(H) 107(H) 74  BUN 8 - 23 mg/dL 48(H) 46(H) 52(H)  Creatinine 0.61 - 1.24 mg/dL 1.79(H) 1.71(H) 1.68(H)  Sodium 135 - 145 mmol/L 147(H) 146(H) 146(H)  Potassium  3.5 - 5.1 mmol/L 4.2 3.2(L) 3.0(L)  Chloride 98 - 111 mmol/L 116(H) 112(H) 116(H)  CO2 22 - 32 mmol/L 21(L) 22 21(L)  Calcium 8.9 - 10.3 mg/dL 7.8(L) 7.6(L) 7.3(L)  Total Protein 6.5 - 8.1 g/dL 4.9(L) 5.2(L) 5.3(L)  Total Bilirubin 0.3 - 1.2 mg/dL 0.6 0.5 0.6  Alkaline Phos 38 - 126 U/L 69 66 67  AST 15 - 41 U/L $Remo'16 16 18  'nDkLw$ ALT 0 - 44 U/L $Remo'12 12 14   'VhXhJ$ . CBC Latest Ref Rng & Units 05/11/2020 05/10/2020 05/09/2020  WBC 4.0 - 10.5 K/uL 2.7(L) 3.1(L) 3.1(L)  Hemoglobin 13.0 - 17.0 g/dL 10.2(L) 10.8(L) 10.6(L)  Hematocrit 39 - 52 % 32.7(L) 33.3(L) 32.8(L)  Platelets 150 - 400 K/uL 51(L) 45(L) 39(L)    Lab Results  Component Value Date   WBC 2.7 (L) 05/11/2020   HGB 10.2 (L) 05/11/2020   HCT 32.7 (L) 05/11/2020   MCV 94.5 05/11/2020   PLT 51 (L) 05/11/2020   NEUTROABS 2.0 05/11/2020    CT ABDOMEN PELVIS WO CONTRAST  Addendum Date: 04/19/2020   ADDENDUM REPORT: 04/19/2020 12:54 ADDENDUM: The peritoneal fluid within the pelvis is not enhancing as this is a non IV contrast imaging exam. Therefore would described fluid collection as having thickened rim. Favor peritoneal metastasis. Findings conveyed toSCOTT GOLDSTON on 04/19/2020  at12:54. Electronically Signed   By: Suzy Bouchard M.D.   On: 04/19/2020 12:54   Result Date: 04/19/2020 CLINICAL DATA:  Abdominal pain.  Tachycardia.  Mantle cell lymphoma EXAM: CT ABDOMEN AND PELVIS WITHOUT CONTRAST TECHNIQUE: Multidetector CT imaging of the abdomen and pelvis was performed following the standard protocol without IV contrast. COMPARISON:  PET-CT scan 01/02/2020 FINDINGS: Lower chest: New small RIGHT effusion. No pericardial effusion. There is new precordial adenopathy anterior to the RIGHT heart and liver (image 9/2). Hepatobiliary: New intraperitoneal free fluid along the margin of the RIGHT hepatic lobe. This fluid is simple attenuation. The liver appears unchanged on noncontrast exam. Large cyst the LEFT hepatic lobe. Gallbladder normal.  Pancreas: Pancreas is grossly normal noncontrast exam Spleen: . spleen is unchanged Adrenals/urinary tract: Adrenal glands kidneys and ureters normal. Bladder Stomach/Bowel: Stomach duodenum normal. There is a new mass lesion in the LEFT upper quadrant measuring 14.2 by 13.0 cm. The mass may be associated the small bowel or the small bowel mesentery. The descending colon is also involving the mass (image 43/2. There is extensive fluid within the leaves of the mesentery of the distal small bowel. Appendix normal. Ascending transverse colon normal. The descending colon tracks along the new LEFT upper quadrant mass. Vascular/Lymphatic: New large LEFT upper quadrant mass either represents lymphoma involving the small bowel or mesentery. Mass is very large described the stomach section. Additionally there is new periportal adenopathy with enlarged lymph nodes along the aorta measuring up to 2 cm (image 44/2. There is nodularity along the greater omentum abdominal on the LEFT. Reproductive: Prostate unremarkable Other: Small volume free fluid the pelvis. There is enhancing rim to the fluid (image 73/2 which could indicate peritonitis. Musculoskeletal: No aggressive osseous lesion. IMPRESSION: 1. Dominant finding is new large (15 cm) lymphoid mass within LEFT upper quadrant. Mass is new from PET-CT 01/02/2020. Differential includes  massive mesenteric adenopathy versus lymphoma of the small bowel or descending colon. No oral or IV contrast. 2. New periaortic retroperitoneal lymphadenopathy. New precordial adenopathy. 3. Extensive fluid within the mesentery and omental thickening in LEFT lower quadrant. 4. Free fluid the pelvis with thin enhancing rim suggest peritonitis. 5. No evidence of bowel perforation. No evidence of bowel obstruction. Electronically Signed: By: Suzy Bouchard M.D. On: 04/19/2020 12:39   DG Chest 1 View  Result Date: 04/28/2020 CLINICAL DATA:  Elevated blood pressure. EXAM: CHEST  1 VIEW  COMPARISON:  April 29, 2020 FINDINGS: The lung volumes are low. There are small bilateral pleural effusions with adjacent atelectasis. The heart size is mildly enlarged. The lung volumes are low. There is no pneumothorax. IMPRESSION: 1. Low lung volumes. 2. Small bilateral pleural effusions with adjacent atelectasis. Electronically Signed   By: Constance Holster M.D.   On: 05/15/2020 20:42   DG Chest 2 View  Result Date: 05/08/2020 CLINICAL DATA:  Shortness of breath lasting several days. Groin swelling and redness. EXAM: CHEST - 2 VIEW COMPARISON:  Chest CT 04/19/20 FINDINGS: Stable cardiomediastinal contours. Low lung volumes. Unchanged small bilateral pleural effusions. No airspace densities. IMPRESSION: Low lung volumes with persistent small bilateral pleural effusions. Electronically Signed   By: Kerby Moors M.D.   On: 05/01/2020 05:09   DG Abd 1 View  Result Date: 05/18/2020 CLINICAL DATA:  Tube placement EXAM: ABDOMEN - 1 VIEW COMPARISON:  April 29, 2020 FINDINGS: The enteric tube projects over the gastric antrum/pylorus. The tube is pointed distally. The visualized bowel gas pattern is unremarkable. IMPRESSION: The enteric tube projects over the gastric antrum/pylorus. Electronically Signed   By: Constance Holster M.D.   On: 05/01/2020 22:05   CT CHEST WO CONTRAST  Result Date: 04/19/2020 CLINICAL DATA:  Pneumonia. Pleural effusion. Concern for metastases. EXAM: CT CHEST WITHOUT CONTRAST TECHNIQUE: Multidetector CT imaging of the chest was performed following the standard protocol without IV contrast. COMPARISON:  CT dated Nov 14, 2019 FINDINGS: Cardiovascular: The heart size is stable. Coronary artery calcifications are noted. There is no significant pericardial effusion. Mediastinum/Nodes: -- No mediastinal lymphadenopathy. -- No hilar lymphadenopathy. --there is left axillary adenopathy. This has progressed since the prior study. There is mild right axillary adenopathy, progressed  from prior study. -- No supraclavicular lymphadenopathy. -- Normal thyroid gland where visualized. -there is mild diffuse esophageal wall thickening. Lungs/Pleura: There are trace to small bilateral pleural effusions, right greater than left. There is atelectasis at the right lung base. There is no pneumothorax. No area of consolidation concerning for pneumonia. Upper Abdomen: There is a partially visualized mass in the left upper quadrant, better visualized on the patient's prior CT. There is free fluid in the upper abdomen. Multiple enlarged pericardiophrenic lymph nodes are noted. There appears to be retroperitoneal adenopathy. Musculoskeletal: No chest wall abnormality. No bony spinal canal stenosis. IMPRESSION: 1. Trace to small bilateral pleural effusions, right greater than left. There is atelectasis at the right lung base. No area of consolidation concerning for pneumonia. 2. Mild diffuse esophageal wall thickening. Correlation with patient's symptoms is recommended. 3. Worsening axillary adenopathy. 4. Partially visualized mass in the left upper quadrant, better visualized on the patient's prior CT. 5. Small volume ascites. Aortic Atherosclerosis (ICD10-I70.0). Electronically Signed   By: Constance Holster M.D.   On: 04/19/2020 20:37   CT Angio Chest PE W and/or Wo Contrast  Result Date: 05/10/2020 CLINICAL DATA:  70 year old male with history of shortness of breath for the past  few days. Abdominal distension. History of mantle cell lymphoma. EXAM: CT ANGIOGRAPHY CHEST CT ABDOMEN AND PELVIS WITH CONTRAST TECHNIQUE: Multidetector CT imaging of the chest was performed using the standard protocol during bolus administration of intravenous contrast. Multiplanar CT image reconstructions and MIPs were obtained to evaluate the vascular anatomy. Multidetector CT imaging of the abdomen and pelvis was performed using the standard protocol during bolus administration of intravenous contrast. CONTRAST:  122mL  OMNIPAQUE IOHEXOL 350 MG/ML SOLN COMPARISON:  CT the abdomen and pelvis 04/19/2020. CT the chest 04/19/2020. FINDINGS: CTA CHEST FINDINGS Cardiovascular: Study is limited by patient respiratory motion. However, despite this limitation there are segmental sized filling defects within pulmonary arteries to the left upper and lower lobes best appreciated on axial images 73 and 106 of series 5, and axial image 148 of series 5 respectively, compatible with pulmonary embolism. Heart size is normal. There is no significant pericardial fluid, thickening or pericardial calcification. Aortic atherosclerosis. No definite coronary artery calcifications. Mediastinum/Nodes: Multiple enlarged anterior mediastinal lymph nodes noted inferiorly, immediately above the right hemidiaphragm, measuring up to 2.3 cm in short axis (axial image 13 of series 11), increased compared to the prior study. Prominent but nonenlarged distal paraesophageal lymph node measuring 8 mm in short axis, nonspecific. Circumferential thickening of the distal third of the esophagus. Mildly enlarged left axillary lymph nodes measuring up to 1.3 cm in short axis. Lungs/Pleura: Collapse of the trachea and mainstem bronchi during expiration, indicative of tracheobronchomalacia. No acute consolidative airspace disease. Small bilateral pleural effusions lying dependently. No definite suspicious appearing pulmonary nodules or masses are noted. Musculoskeletal: There are no aggressive appearing lytic or blastic lesions noted in the visualized portions of the skeleton. Review of the MIP images confirms the above findings. CT ABDOMEN and PELVIS FINDINGS Hepatobiliary: Multiple well-defined low-attenuation lesions are again noted throughout the liver, similar in size, number and distribution to the prior study, largest of which are compatible with simple cysts measuring up to 5.3 x 3.7 cm in segment 2 and for a (axial image 18 of series 11). Smaller lesions are too small  to definitively characterize, but also favored to represent small cysts or biliary hamartomas. No new suspicious hepatic lesions. No intra or extrahepatic biliary ductal dilatation. 6 mm calcified gallstone lying dependently in the gallbladder. Gallbladder is not distended. Pancreas: No pancreatic mass. No pancreatic ductal dilatation. No pancreatic or peripancreatic fluid collections or inflammatory changes. Spleen: Unremarkable. Adrenals/Urinary Tract: 1.2 cm low-attenuation lesion in the upper pole of the right kidney and exophytic 2.4 cm low-attenuation lesion in the upper pole of the left kidney, compatible with simple cysts. Other subcentimeter low-attenuation lesions in both kidneys, too small to definitively characterize, but favored to represent tiny cysts. No hydroureteronephrosis. Urinary bladder is nearly decompressed, but otherwise unremarkable in appearance. Stomach/Bowel: Stomach is unremarkable in appearance. No pathologic dilatation of small bowel or colon. Multiple bowel loops in the left upper quadrant are intimately associated with the large soft tissue mass (discussed below). Colonic diverticulosis, particularly in the sigmoid colon, without definitive evidence to suggest an acute diverticulitis at this time. Vascular/Lymphatic: Aortic atherosclerosis, without evidence of aneurysm or dissection in the abdominal or pelvic vasculature. Extensive lymphadenopathy noted in the abdomen, most evident in the retroperitoneum where there are bulky para-aortic nodal masses measuring up to 2.3 x 4.1 cm near the left renal artery (axial image 41 of series 11), 3.2 x 4.6 cm adjacent to the infrarenal abdominal aorta (axial image 48 of series 11), and 3.4 x 3.2  cm adjacent to the aortic bifurcation (axial image 54 of series 11), all of which appear similar to the recent prior study. Bulky celiac axis and gastrohepatic ligament lymph nodes are noted, measuring up to 1.9 cm in short axis (axial image 24 of  series 11). Several prominent but nonenlarged pelvic lymph nodes are noted (nonspecific). Reproductive: Prostate gland and seminal vesicles are unremarkable in appearance. Other: Bulky poorly defined soft tissue mass in the left upper quadrant of the abdomen which is intimately associated with multiple adjacent bowel loops, and difficult to distinctly defined, but estimated to measure approximately 15.5 x 13.5 x 13.4 cm (axial image 38 of series 11 and coronal image 53 of series 14). Moderate volume of ascites. Diffuse enhancement of the peritoneal membranes, indicative of widespread peritoneal metastatic disease. Extensive omental caking, best appreciated in the left side of the abdomen where the bulkiest portion of this measures approximately 14.9 x 4.0 cm (axial image 47 of series 11). No pneumoperitoneum. Musculoskeletal: There are no aggressive appearing lytic or blastic lesions noted in the visualized portions of the skeleton. Review of the MIP images confirms the above findings. IMPRESSION: 1. Study is positive for segmental sized pulmonary emboli in the left lung. These appear nonocclusive at this time, and there is no associated pulmonary hemorrhage to suggest frank pulmonary infarct. 2. Bulky soft tissue mass in the left upper quadrant with extensive lymphadenopathy in the chest and abdomen, and evidence of widespread intraperitoneal metastatic disease, as detailed above. 3. Small bilateral pleural effusions lying dependently. 4. Aortic atherosclerosis. 5. Tracheobronchomalacia. 6. Cholelithiasis. 7. Colonic diverticulosis. Electronically Signed   By: Vinnie Langton M.D.   On: 05/19/2020 12:50   CT Abdomen Pelvis W Contrast  Result Date: 05/14/2020 CLINICAL DATA:  69 year old male with history of shortness of breath for the past few days. Abdominal distension. History of mantle cell lymphoma. EXAM: CT ANGIOGRAPHY CHEST CT ABDOMEN AND PELVIS WITH CONTRAST TECHNIQUE: Multidetector CT imaging of the  chest was performed using the standard protocol during bolus administration of intravenous contrast. Multiplanar CT image reconstructions and MIPs were obtained to evaluate the vascular anatomy. Multidetector CT imaging of the abdomen and pelvis was performed using the standard protocol during bolus administration of intravenous contrast. CONTRAST:  176mL OMNIPAQUE IOHEXOL 350 MG/ML SOLN COMPARISON:  CT the abdomen and pelvis 04/19/2020. CT the chest 04/19/2020. FINDINGS: CTA CHEST FINDINGS Cardiovascular: Study is limited by patient respiratory motion. However, despite this limitation there are segmental sized filling defects within pulmonary arteries to the left upper and lower lobes best appreciated on axial images 73 and 106 of series 5, and axial image 148 of series 5 respectively, compatible with pulmonary embolism. Heart size is normal. There is no significant pericardial fluid, thickening or pericardial calcification. Aortic atherosclerosis. No definite coronary artery calcifications. Mediastinum/Nodes: Multiple enlarged anterior mediastinal lymph nodes noted inferiorly, immediately above the right hemidiaphragm, measuring up to 2.3 cm in short axis (axial image 13 of series 11), increased compared to the prior study. Prominent but nonenlarged distal paraesophageal lymph node measuring 8 mm in short axis, nonspecific. Circumferential thickening of the distal third of the esophagus. Mildly enlarged left axillary lymph nodes measuring up to 1.3 cm in short axis. Lungs/Pleura: Collapse of the trachea and mainstem bronchi during expiration, indicative of tracheobronchomalacia. No acute consolidative airspace disease. Small bilateral pleural effusions lying dependently. No definite suspicious appearing pulmonary nodules or masses are noted. Musculoskeletal: There are no aggressive appearing lytic or blastic lesions noted in the visualized portions of  the skeleton. Review of the MIP images confirms the above  findings. CT ABDOMEN and PELVIS FINDINGS Hepatobiliary: Multiple well-defined low-attenuation lesions are again noted throughout the liver, similar in size, number and distribution to the prior study, largest of which are compatible with simple cysts measuring up to 5.3 x 3.7 cm in segment 2 and for a (axial image 18 of series 11). Smaller lesions are too small to definitively characterize, but also favored to represent small cysts or biliary hamartomas. No new suspicious hepatic lesions. No intra or extrahepatic biliary ductal dilatation. 6 mm calcified gallstone lying dependently in the gallbladder. Gallbladder is not distended. Pancreas: No pancreatic mass. No pancreatic ductal dilatation. No pancreatic or peripancreatic fluid collections or inflammatory changes. Spleen: Unremarkable. Adrenals/Urinary Tract: 1.2 cm low-attenuation lesion in the upper pole of the right kidney and exophytic 2.4 cm low-attenuation lesion in the upper pole of the left kidney, compatible with simple cysts. Other subcentimeter low-attenuation lesions in both kidneys, too small to definitively characterize, but favored to represent tiny cysts. No hydroureteronephrosis. Urinary bladder is nearly decompressed, but otherwise unremarkable in appearance. Stomach/Bowel: Stomach is unremarkable in appearance. No pathologic dilatation of small bowel or colon. Multiple bowel loops in the left upper quadrant are intimately associated with the large soft tissue mass (discussed below). Colonic diverticulosis, particularly in the sigmoid colon, without definitive evidence to suggest an acute diverticulitis at this time. Vascular/Lymphatic: Aortic atherosclerosis, without evidence of aneurysm or dissection in the abdominal or pelvic vasculature. Extensive lymphadenopathy noted in the abdomen, most evident in the retroperitoneum where there are bulky para-aortic nodal masses measuring up to 2.3 x 4.1 cm near the left renal artery (axial image 41 of  series 11), 3.2 x 4.6 cm adjacent to the infrarenal abdominal aorta (axial image 48 of series 11), and 3.4 x 3.2 cm adjacent to the aortic bifurcation (axial image 54 of series 11), all of which appear similar to the recent prior study. Bulky celiac axis and gastrohepatic ligament lymph nodes are noted, measuring up to 1.9 cm in short axis (axial image 24 of series 11). Several prominent but nonenlarged pelvic lymph nodes are noted (nonspecific). Reproductive: Prostate gland and seminal vesicles are unremarkable in appearance. Other: Bulky poorly defined soft tissue mass in the left upper quadrant of the abdomen which is intimately associated with multiple adjacent bowel loops, and difficult to distinctly defined, but estimated to measure approximately 15.5 x 13.5 x 13.4 cm (axial image 38 of series 11 and coronal image 53 of series 14). Moderate volume of ascites. Diffuse enhancement of the peritoneal membranes, indicative of widespread peritoneal metastatic disease. Extensive omental caking, best appreciated in the left side of the abdomen where the bulkiest portion of this measures approximately 14.9 x 4.0 cm (axial image 47 of series 11). No pneumoperitoneum. Musculoskeletal: There are no aggressive appearing lytic or blastic lesions noted in the visualized portions of the skeleton. Review of the MIP images confirms the above findings. IMPRESSION: 1. Study is positive for segmental sized pulmonary emboli in the left lung. These appear nonocclusive at this time, and there is no associated pulmonary hemorrhage to suggest frank pulmonary infarct. 2. Bulky soft tissue mass in the left upper quadrant with extensive lymphadenopathy in the chest and abdomen, and evidence of widespread intraperitoneal metastatic disease, as detailed above. 3. Small bilateral pleural effusions lying dependently. 4. Aortic atherosclerosis. 5. Tracheobronchomalacia. 6. Cholelithiasis. 7. Colonic diverticulosis. Electronically Signed   By:  Vinnie Langton M.D.   On: 05/14/2020 12:50  DG CHEST PORT 1 VIEW  Result Date: 05/11/2020 CLINICAL DATA:  Atelectasis. EXAM: PORTABLE CHEST 1 VIEW COMPARISON:  05/10/2020.  05/08/2020 FINDINGS: Right PICC line stable position. Heart size stable. Low lung volumes with persistent bibasilar atelectasis. Bilateral interstitial prominence. Interstitial edema and or pneumonitis could present in this fashion. No pleural effusion or pneumothorax. IMPRESSION: 1. Right PICC line in stable position. 2. Low lung volumes with persistent bibasilar atelectasis. Bilateral interstitial prominence. Interstitial edema and/or pneumonitis could present this fashion. Electronically Signed   By: Marcello Moores  Register   On: 05/11/2020 06:55   DG CHEST PORT 1 VIEW  Result Date: 05/10/2020 CLINICAL DATA:  Shortness of breath. Mantle cell lymphoma on chemotherapy EXAM: PORTABLE CHEST 1 VIEW COMPARISON:  05/08/2020 FINDINGS: Interval removal of right IJ approach central venous catheter. Right-sided PICC line remains in place terminating at the level of the distal SVC. Stable cardiomediastinal contours. Low lung volumes. Bibasilar atelectasis with increasing medial right basilar opacity. No appreciable pleural fluid collection. No pneumothorax. IMPRESSION: Bibasilar atelectasis with increasing medial right basilar opacity, atelectasis versus infection. Electronically Signed   By: Davina Poke D.O.   On: 05/10/2020 08:25   DG CHEST PORT 1 VIEW  Result Date: 05/08/2020 CLINICAL DATA:  69 year old male status post PICC placement. EXAM: PORTABLE CHEST 1 VIEW COMPARISON:  Chest radiograph dated 05/08/2020. FINDINGS: Right IJ central venous line in similar position. There has been interval placement of a right-sided PICC with tip over central SVC close to the cavoatrial junction. There is shallow inspiration with bibasilar atelectasis. No significant interval change in the appearance of the lungs compared to the earlier radiograph.  No large pleural effusion or pneumothorax. Stable cardiac silhouette. No acute osseous pathology. IMPRESSION: Interval placement of a right-sided PICC with tip over central SVC close to the cavoatrial junction. Electronically Signed   By: Anner Crete M.D.   On: 05/08/2020 16:44   DG CHEST PORT 1 VIEW  Result Date: 05/08/2020 CLINICAL DATA:  69 year old male with a history of shortness of breath EXAM: PORTABLE CHEST 1 VIEW COMPARISON:  05/06/2020 FINDINGS: Cardiomediastinal silhouette unchanged in size and contour. Low lung volumes persist. Patchy opacities at the lung bases persist. No pneumothorax or pleural effusion. No confluent airspace disease. Unchanged right IJ central venous catheter. No acute displaced fracture IMPRESSION: Unchanged appearance of the chest x-ray with low lung volumes and basilar atelectasis. Right IJ central venous catheter unchanged Electronically Signed   By: Corrie Mckusick D.O.   On: 05/08/2020 13:13   DG CHEST PORT 1 VIEW  Result Date: 05/06/2020 CLINICAL DATA:  Increased shortness of breath, hypertension EXAM: PORTABLE CHEST 1 VIEW COMPARISON:  04/30/2020 FINDINGS: Single frontal view of the chest demonstrates interval extubation and removal of the enteric catheter. Right internal jugular catheter tip projects over superior vena cava. The cardiac silhouette is stable. Lung volumes are diminished, without airspace disease, effusion, or pneumothorax. IMPRESSION: 1. Low lung volumes.  No acute process. Electronically Signed   By: Randa Ngo M.D.   On: 05/06/2020 18:51   DG CHEST PORT 1 VIEW  Result Date: 04/30/2020 CLINICAL DATA:  Central line placement EXAM: PORTABLE CHEST 1 VIEW COMPARISON:  Radiograph 04/28/2020 FINDINGS: Endotracheal tube tip terminates 2.2 cm from the expected location of the carina. Transesophageal tube tip and side port distal to the GE junction. Right IJ catheter tip terminates in the right atrium. Telemetry leads overlie the chest. Layering  bilateral effusions adjacent passive atelectasis. Additional bandlike subsegmental atelectasis in the lungs as well. Stable  borderline cardiomegaly accounting for portable technique. No acute osseous or soft tissue abnormality. Degenerative changes are present in the imaged spine and shoulders. IMPRESSION: 1. Endotracheal tube tip terminates 2.2 cm from the expected location of the carina. Could consider retraction 2 cm to the mid trachea. 2. Transesophageal tube tip and side port distal to the GE junction. 3. Right IJ catheter tip terminates in the right atrium. 4. Layering bilateral effusions with adjacent passive atelectasis and vascular congestion. 5. Stable borderline cardiomegaly. Electronically Signed   By: Lovena Le M.D.   On: 04/30/2020 01:14   DG CHEST PORT 1 VIEW  Result Date: 05/12/2020 CLINICAL DATA:  Endotracheal tube placement EXAM: PORTABLE CHEST 1 VIEW COMPARISON:  April 29, 2020 FINDINGS: The endotracheal tube terminates above the carina. The lung volumes are low. Again identified are bilateral pleural effusions with adjacent atelectasis. The heart size is stable. There is no pneumothorax. The enteric tube terminates below the left hemidiaphragm. IMPRESSION: Lines and tubes as above.  Otherwise, stable exam. Electronically Signed   By: Constance Holster M.D.   On: 05/02/2020 22:04   DG Chest Portable 1 View  Result Date: 04/19/2020 CLINICAL DATA:  Cough, wheezing EXAM: PORTABLE CHEST 1 VIEW COMPARISON:  01/09/2020 FINDINGS: Low lung volumes. Minimal atelectasis or scarring at the left lung base. No pleural effusion or pneumothorax. Heart size is within normal limits for technique. IMPRESSION: Minimal atelectasis or scarring at the left lung base. Electronically Signed   By: Macy Mis M.D.   On: 04/19/2020 12:18   DG Abd Portable 2 Views  Result Date: 05/12/2020 CLINICAL DATA:  Abdominal distension and pain. EXAM: PORTABLE ABDOMEN - 2 VIEW COMPARISON:  04/19/2020 FINDINGS:  There is mild gaseous distension of the stomach. No dilated loops of large or small bowel. There is no evidence of free air. No radio-opaque calculi or other significant radiographic abnormality is seen. IMPRESSION: Nonobstructive bowel gas pattern. Electronically Signed   By: Kerby Moors M.D.   On: 05/20/2020 06:12   DG Swallowing Func-Speech Pathology  Result Date: 05/04/2020 Objective Swallowing Evaluation: Type of Study: MBS-Modified Barium Swallow Study  Patient Details Name: Dean Dixon MRN: [id number] Date of Birth: 21-Oct-1950 Today's Date: 05/04/2020 Time: SLP Start Time (ACUTE ONLY): 0854 -SLP Stop Time (ACUTE ONLY): 0910 SLP Time Calculation (min) (ACUTE ONLY): 16 min Past Medical History: Past Medical History: Diagnosis Date . Cancer ([place])   Mantle Cell Lymphoma . Diabetes mellitus without complication ([place])  . Hypertension  Past Surgical History: Past Surgical History: Procedure Laterality Date . APPENDECTOMY   . TONSILLECTOMY   HPI: Damany Eastman is a 69 y.o. male with medical history significant of mantle cell lymphoma on CHOP. Presenting with dyspnea and ab distention. Reports 2 weeks of shortness of breath that has worsened in the last 2 days. ETT 11/7-11/9.  Subjective: alert at bedside Assessment / Plan / Recommendation CHL IP CLINICAL IMPRESSIONS 05/04/2020 Clinical Impression Pt demonstrated oropharyngeal dysphagia with aspiration of nectar thick barium marked by delayed initiation and incompetent laryngeal closure. Barium aggregated in his valleculae and pyriform sinuses for approximately 4 seconds filling pyriforms and spilling to vocal cords prior to laryngeal closure. Subsequent trial nectar was aspirated with cough not clearing trachea. He coughed intermittently throughout without new penetrates/aspirates suspect from previous intrusion. Mild delay with honey thick with mild pharyngeal residue. Inconsistent suboptimal movement of hyoid anteriorally and superiorally. Pt clearly  fatigued at onset of and during study with increased respirations and decreased endurance. Compensatory strategies not attempted given  level of current ability to perform from medical standpoint. Mild lingual residue cleared with independent swallows. Recommend he initiate Dys 1 (puree), honey thick liquid cautiously with pills whole in puree, support for self feeding and rest breaks. Recommendations may be modified if there are changes to plan of care in terms of Palliative.          SLP Visit Diagnosis Dysphagia, oropharyngeal phase (R13.12) Attention and concentration deficit following -- Frontal lobe and executive function deficit following -- Impact on safety and function Moderate aspiration risk   CHL IP TREATMENT RECOMMENDATION 05/04/2020 Treatment Recommendations Therapy as outlined in treatment plan below   Prognosis 05/04/2020 Prognosis for Safe Diet Advancement Good Barriers to Reach Goals (No Data) Barriers/Prognosis Comment -- CHL IP DIET RECOMMENDATION 05/04/2020 SLP Diet Recommendations Honey thick liquids;Dysphagia 1 (Puree) solids Liquid Administration via Cup;No straw Medication Administration Whole meds with puree Compensations Slow rate;Small sips/bites;Other (Comment) Postural Changes Seated upright at 90 degrees   CHL IP OTHER RECOMMENDATIONS 05/04/2020 Recommended Consults -- Oral Care Recommendations Oral care BID Other Recommendations Order thickener from pharmacy   CHL IP FOLLOW UP RECOMMENDATIONS 05/04/2020 Follow up Recommendations Other (comment)   CHL IP FREQUENCY AND DURATION 05/04/2020 Speech Therapy Frequency (ACUTE ONLY) min 2x/week Treatment Duration 2 weeks      CHL IP ORAL PHASE 05/04/2020 Oral Phase Impaired Oral - Pudding Teaspoon -- Oral - Pudding Cup -- Oral - Honey Teaspoon -- Oral - Honey Cup Lingual/palatal residue Oral - Nectar Teaspoon -- Oral - Nectar Cup Lingual/palatal residue Oral - Nectar Straw -- Oral - Thin Teaspoon -- Oral - Thin Cup -- Oral - Thin Straw -- Oral  - Puree -- Oral - Mech Soft -- Oral - Regular Lingual/palatal residue Oral - Multi-Consistency -- Oral - Pill -- Oral Phase - Comment --  CHL IP PHARYNGEAL PHASE 05/04/2020 Pharyngeal Phase Impaired Pharyngeal- Pudding Teaspoon -- Pharyngeal -- Pharyngeal- Pudding Cup -- Pharyngeal -- Pharyngeal- Honey Teaspoon -- Pharyngeal -- Pharyngeal- Honey Cup Delayed swallow initiation-vallecula;Pharyngeal residue - pyriform;Pharyngeal residue - valleculae Pharyngeal -- Pharyngeal- Nectar Teaspoon -- Pharyngeal -- Pharyngeal- Nectar Cup Delayed swallow initiation-pyriform sinuses;Penetration/Aspiration during swallow;Pharyngeal residue - pyriform Pharyngeal Material enters airway, remains ABOVE vocal cords and not ejected out;Material enters airway, passes BELOW cords and not ejected out despite cough attempt by patient Pharyngeal- Nectar Straw -- Pharyngeal -- Pharyngeal- Thin Teaspoon -- Pharyngeal -- Pharyngeal- Thin Cup -- Pharyngeal -- Pharyngeal- Thin Straw -- Pharyngeal -- Pharyngeal- Puree -- Pharyngeal -- Pharyngeal- Mechanical Soft -- Pharyngeal -- Pharyngeal- Regular Pharyngeal residue - posterior pharnyx Pharyngeal -- Pharyngeal- Multi-consistency -- Pharyngeal -- Pharyngeal- Pill -- Pharyngeal -- Pharyngeal Comment --  CHL IP CERVICAL ESOPHAGEAL PHASE 05/04/2020 Cervical Esophageal Phase WFL Pudding Teaspoon -- Pudding Cup -- Honey Teaspoon -- Honey Cup -- Nectar Teaspoon -- Nectar Cup -- Nectar Straw -- Thin Teaspoon -- Thin Cup -- Thin Straw -- Puree -- Mechanical Soft -- Regular -- Multi-consistency -- Pill -- Cervical Esophageal Comment -- [place] Siren 05/04/2020, 10:56 AM Orbie Pyo Colvin Caroli.Ed Actor Pager [phone] Office [phone]              ECHOCARDIOGRAM COMPLETE  Result Date: 04/20/2020    ECHOCARDIOGRAM REPORT   Patient Name:   Dean Dixon Date of Exam: 04/20/2020 Medical Rec #:  [id number]    Height:       64.0 in Accession #:    [id number]   Weight:        210.1 lb Date of Birth:  11-15-1950   BSA:          1.998 m Patient Age:    68 years     BP:           156/83 mmHg Patient Gender: M            HR:           88 bpm. Exam Location:  Inpatient Procedure: 2D Echo, Cardiac Doppler and Color Doppler Indications:    Chemo evaluation  History:        Patient has prior history of Echocardiogram examinations, most                 recent 11/14/2019. COPD; Risk Factors:Hypertension and Diabetes.                 Mantle cell lymphoma.  Sonographer:    Dustin Flock Referring Phys: [place]  1. Left ventricular ejection fraction, by estimation, is 65 to 70%. The left ventricle has normal function. The left ventricle has no regional wall motion abnormalities. Left ventricular diastolic parameters are consistent with Grade I diastolic dysfunction (impaired relaxation).  2. Right ventricular systolic function is normal. The right ventricular size is normal. Tricuspid regurgitation signal is inadequate for assessing PA pressure.  3. The mitral valve is grossly normal. Trivial mitral valve regurgitation. No evidence of mitral stenosis.  4. The aortic valve is tricuspid. There is mild calcification of the aortic valve. There is mild thickening of the aortic valve. Aortic valve regurgitation is not visualized. Mild aortic valve sclerosis is present, with no evidence of aortic valve stenosis.  5. The inferior vena cava is normal in size with greater than 50% respiratory variability, suggesting right atrial pressure of 3 mmHg. Comparison(s): Changes from prior study are noted. EF is now 65-70%. FINDINGS  Left Ventricle: Left ventricular ejection fraction, by estimation, is 65 to 70%. The left ventricle has normal function. The left ventricle has no regional wall motion abnormalities. The left ventricular internal cavity size was normal in size. There is  no left ventricular hypertrophy. Left ventricular diastolic parameters are consistent with Grade I  diastolic dysfunction (impaired relaxation). Normal left ventricular filling pressure. Right Ventricle: The right ventricular size is normal. No increase in right ventricular wall thickness. Right ventricular systolic function is normal. Tricuspid regurgitation signal is inadequate for assessing PA pressure. Left Atrium: Left atrial size was normal in size. Right Atrium: Right atrial size was normal in size. Pericardium: Trivial pericardial effusion is present. Presence of pericardial fat pad. Mitral Valve: The mitral valve is grossly normal. Trivial mitral valve regurgitation. No evidence of mitral valve stenosis. Tricuspid Valve: The tricuspid valve is grossly normal. Tricuspid valve regurgitation is not demonstrated. No evidence of tricuspid stenosis. Aortic Valve: The aortic valve is tricuspid. There is mild calcification of the aortic valve. There is mild thickening of the aortic valve. Aortic valve regurgitation is not visualized. Mild aortic valve sclerosis is present, with no evidence of aortic valve stenosis. Aortic valve mean gradient measures 11.1 mmHg. Aortic valve peak gradient measures 16.7 mmHg. Aortic valve area, by VTI measures 2.42 cm. Pulmonic Valve: The pulmonic valve was grossly normal. Pulmonic valve regurgitation is not visualized. No evidence of pulmonic stenosis. Aorta: The aortic root is normal in size and structure. Venous: The right upper pulmonary vein is normal. The inferior vena cava is normal in size with greater than 50% respiratory variability, suggesting right atrial pressure of 3 mmHg. IAS/Shunts: The atrial septum  is grossly normal. Additional Comments: There is a small pleural effusion in the left lateral region.  LEFT VENTRICLE PLAX 2D LVIDd:         4.30 cm  Diastology LVIDs:         2.60 cm  LV e' medial:    8.81 cm/s LV PW:         1.10 cm  LV E/e' medial:  7.2 LV IVS:        1.10 cm  LV e' lateral:   7.51 cm/s LVOT diam:     2.30 cm  LV E/e' lateral: 8.4 LV SV:         92  LV SV Index:   46 LVOT Area:     4.15 cm  RIGHT VENTRICLE RV Basal diam:  3.20 cm RV S prime:     11.00 cm/s TAPSE (M-mode): 2.9 cm LEFT ATRIUM             Index       RIGHT ATRIUM           Index LA diam:        3.30 cm 1.65 cm/m  RA Area:     15.30 cm LA Vol (A2C):   43.2 ml 21.62 ml/m RA Volume:   37.60 ml  18.82 ml/m LA Vol (A4C):   30.7 ml 15.37 ml/m LA Biplane Vol: 36.9 ml 18.47 ml/m  AORTIC VALVE AV Area (Vmax):    2.42 cm AV Area (Vmean):   2.14 cm AV Area (VTI):     2.42 cm AV Vmax:           204.20 cm/s AV Vmean:          159.222 cm/s AV VTI:            0.381 m AV Peak Grad:      16.7 mmHg AV Mean Grad:      11.1 mmHg LVOT Vmax:         119.00 cm/s LVOT Vmean:        82.200 cm/s LVOT VTI:          0.222 m LVOT/AV VTI ratio: 0.58  AORTA Ao Root diam: 3.10 cm MITRAL VALVE MV Area (PHT): 4.06 cm     SHUNTS MV Decel Time: 187 msec     Systemic VTI:  0.22 m MV E velocity: 63.40 cm/s   Systemic Diam: 2.30 cm MV A velocity: 104.00 cm/s MV E/A ratio:  0.61 Eleonore Chiquito MD Electronically signed by Eleonore Chiquito MD Signature Date/Time: 04/20/2020/10:32:25 AM    Final    [place] ASCITES (ABDOMEN LIMITED)  Result Date: 04/19/2020 CLINICAL DATA:  69 year old male with abdominal distension, ascites EXAM: LIMITED ABDOMEN ULTRASOUND FOR ASCITES TECHNIQUE: Limited ultrasound survey for ascites was performed in all four abdominal quadrants. COMPARISON:  CT abdomen/pelvis 04/19/2020 FINDINGS: Sonographic interrogation of the abdomen was performed in anticipation of paracentesis. Unfortunately, the volume is a ascites is quite small and insufficient for drainage. There is however extensive omental caking and peritoneal nodularity. IMPRESSION: 1. Small volume ascites, insufficient for paracentesis. 2. Extensive omental caking and peritoneal nodularity consistent with peritoneal carcinomatosis. Electronically Signed   By: Jacqulynn Cadet M.D.   On: 04/19/2020 16:53   VAS [place] LOWER EXTREMITY VENOUS (DVT)  Result  Date: 05/01/2020  Lower Venous DVT Study Indications: Pain, Swelling, and Edema.  Risk Factors: Immobility confirmed PE. Performing Technologist: Griffin Basil RCT RDMS  Examination Guidelines: A complete evaluation includes B-mode imaging, spectral Doppler, color  Doppler, and power Doppler as needed of all accessible portions of each vessel. Bilateral testing is considered an integral part of a complete examination. Limited examinations for reoccurring indications may be performed as noted. The reflux portion of the exam is performed with the patient in reverse Trendelenburg.  +---------+---------------+---------+-----------+----------+--------------+ RIGHT    CompressibilityPhasicitySpontaneityPropertiesThrombus Aging +---------+---------------+---------+-----------+----------+--------------+ CFV      Full           Yes      Yes                                 +---------+---------------+---------+-----------+----------+--------------+ SFJ      Full                                                        +---------+---------------+---------+-----------+----------+--------------+ FV Prox  Full                                                        +---------+---------------+---------+-----------+----------+--------------+ FV Mid   Full                                                        +---------+---------------+---------+-----------+----------+--------------+ FV DistalFull                                                        +---------+---------------+---------+-----------+----------+--------------+ PFV      Full                                                        +---------+---------------+---------+-----------+----------+--------------+ POP      Full           Yes      Yes                                 +---------+---------------+---------+-----------+----------+--------------+ PTV      Full                                                         +---------+---------------+---------+-----------+----------+--------------+ PERO     Full                                                        +---------+---------------+---------+-----------+----------+--------------+   +---------+---------------+---------+-----------+----------+--------------+  LEFT     CompressibilityPhasicitySpontaneityPropertiesThrombus Aging +---------+---------------+---------+-----------+----------+--------------+ CFV      Full           Yes      Yes                                 +---------+---------------+---------+-----------+----------+--------------+ SFJ      Full                                                        +---------+---------------+---------+-----------+----------+--------------+ FV Prox  Full                                                        +---------+---------------+---------+-----------+----------+--------------+ FV Mid   Full                                                        +---------+---------------+---------+-----------+----------+--------------+ FV DistalFull                                                        +---------+---------------+---------+-----------+----------+--------------+ PFV      Full                                                        +---------+---------------+---------+-----------+----------+--------------+ POP      Full           Yes      Yes                                 +---------+---------------+---------+-----------+----------+--------------+ PTV      Full                                                        +---------+---------------+---------+-----------+----------+--------------+ PERO     Full                                                        +---------+---------------+---------+-----------+----------+--------------+     Summary: RIGHT: - There is no evidence of deep vein thrombosis in the lower extremity.  - No cystic structure  found in the popliteal fossa.  LEFT: - There is no evidence of deep vein thrombosis in the lower extremity.  - No  cystic structure found in the popliteal fossa.  *See table(s) above for measurements and observations. Electronically signed by Curt Jews MD on 05/01/2020 at 2:50:45 PM.    Final    ECHOCARDIOGRAM LIMITED  Result Date: 04/30/2020    ECHOCARDIOGRAM LIMITED REPORT   Patient Name:   Dean Dixon Date of Exam: 04/30/2020 Medical Rec #:  [id number]    Height:       64.0 in Accession #:    [id number]   Weight:       205.0 lb Date of Birth:  [birth year]   BSA:          1.977 m Patient Age:    34 years     BP:           114/60 mmHg Patient Gender: M            HR:           109 bpm. Exam Location:  Inpatient Procedure: Limited Echo, Limited Color Doppler and Cardiac Doppler Indications:    Pulmonary Embolus I26.99  History:        Patient has prior history of Echocardiogram examinations, most                 recent 04/20/2020. Mantle cell lymphoma; Risk                 Factors:Hypertension and Diabetes.  Sonographer:    Mikki Santee RDCS (AE) Referring Phys: [id number] Jonnie Finner  Sonographer Comments: Echo performed with patient supine and on artificial respirator. IMPRESSIONS  1. Cystic structure noted in liver on Subcostal images.  2. Left ventricular ejection fraction, by estimation, is 60 to 65%. The left ventricle has normal function. The left ventricle has no regional wall motion abnormalities.  3. Right ventricular systolic function is normal. The right ventricular size is normal.  4. The pericardial effusion is posterior to the left ventricle.  5. The mitral valve is normal in structure. No evidence of mitral valve regurgitation. No evidence of mitral stenosis.  6. The aortic valve is normal in structure. Aortic valve regurgitation is not visualized. No aortic stenosis is present.  7. The inferior vena cava is normal in size with greater than 50% respiratory variability, suggesting right atrial  pressure of 3 mmHg. FINDINGS  Left Ventricle: Left ventricular ejection fraction, by estimation, is 60 to 65%. The left ventricle has normal function. The left ventricle has no regional wall motion abnormalities. The left ventricular internal cavity size was normal in size. There is  no left ventricular hypertrophy. Right Ventricle: The right ventricular size is normal. No increase in right ventricular wall thickness. Right ventricular systolic function is normal. Left Atrium: Left atrial size was normal in size. Right Atrium: Right atrial size was normal in size. Pericardium: Trivial pericardial effusion is present. The pericardial effusion is posterior to the left ventricle. Mitral Valve: The mitral valve is normal in structure. No evidence of mitral valve stenosis. Tricuspid Valve: The tricuspid valve is normal in structure. Tricuspid valve regurgitation is mild . No evidence of tricuspid stenosis. Aortic Valve: The aortic valve is normal in structure. Aortic valve regurgitation is not visualized. No aortic stenosis is present. Pulmonic Valve: The pulmonic valve was normal in structure. Pulmonic valve regurgitation is trivial. No evidence of pulmonic stenosis. Aorta: The aortic root is normal in size and structure. Venous: The inferior vena cava is normal in size with greater than 50% respiratory variability, suggesting right atrial pressure of 3 mmHg. IAS/Shunts:  No atrial level shunt detected by color flow Doppler. Additional Comments: Cystic structure noted in liver on Subcostal images. LEFT VENTRICLE PLAX 2D LVIDd:         4.20 cm LVIDs:         2.60 cm LV PW:         1.00 cm LV IVS:        1.10 cm LVOT diam:     2.30 cm LVOT Area:     4.15 cm  RIGHT VENTRICLE RV S prime:     30.80 cm/s TAPSE (M-mode): 1.8 cm LEFT ATRIUM         Index LA diam:    3.00 cm 1.52 cm/m   AORTA Ao Root diam: 3.40 cm  SHUNTS Systemic Diam: 2.30 cm Jenkins Rouge MD Electronically signed by Jenkins Rouge MD Signature Date/Time:  04/30/2020/9:18:44 AM    Final    [place] EKG SITE RITE  Result Date: 05/08/2020 If Site Rite image not attached, placement could not be confirmed due to current cardiac rhythm.  [place] EKG SITE RITE  Result Date: 05/03/2020 If Site Rite image not attached, placement could not be confirmed due to current cardiac rhythm.  [place] EKG SITE RITE  Result Date: 04/19/2020 If Site Rite image not attached, placement could not be confirmed due to current cardiac rhythm.   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 CD5+ clonal lymphoproliferative disorder" -04/26/2019 FISH revealed "Positive for p53 (17p13) deletion." -05/31/2019 PET/CT ([id number]) revealed "1. There are multiple prominent lymph nodes in the neck, both axilla, the retroperitoneum and pelvis with low level hypermetabolic activity consistent with chronic lymphocytic leukemia. Deauville 3 and 4. 2. Mild splenomegaly and mild generalized splenic hypermetabolic activity 3. No evidence of bone marrow involvement." -06/10/2019 Flow Pathology Report ([id number]) revealed "Abnormal B-cell population." -06/10/2019 Surgical Pathology Report ([id number])  revealed "Atypical lymphoid proliferation consistent with mantle cell lymphoma." -09/19/19 PET Skull Base to Thigh ([id number])-- favorable response to current treatment -01/02/2020 PET/CT ([id number]) revealed "1. Continued further decrease in size and FDG accumulation in index cervical, axillary, retroperitoneal, and pelvic lymph nodes. No lymphadenopathy by size criteria today. FDG accumulation in these lymph nodes is compatible with Deauville 2 category. 2. Interval decrease in size with resolution of hypermetabolism associated with the spleen previously."  -04/19/2020 CT abdomen pelvis revealed "1. Dominant finding is new large (15 cm)  lymphoid mass within LEFT upper quadrant. Mass is new from PET-CT 01/02/2020. Differential includes massive mesenteric adenopathy versus lymphoma of the small bowel or descending colon. No oral or IV contrast. 2. New periaortic retroperitoneal lymphadenopathy. New precordial Adenopathy. 3. Extensive fluid within the mesentery and omental thickening in LEFT lower quadrant. 4. Free fluid the pelvis with thin enhancing rim suggest peritonitis. 5. No evidence of bowel perforation. No evidence of bowel Obstruction." -05/02/2020 CTA chest and CT abdomen pelvis revealed "1. Study is positive for segmental sized pulmonary emboli in the left lung. These appear nonocclusive at this time, and there is no associated pulmonary hemorrhage to suggest frank pulmonary infarct. 2. Bulky soft tissue mass in the left upper quadrant with extensive lymphadenopathy in the chest and abdomen, and evidence of widespread intraperitoneal metastatic disease, as detailed above. 3. Small bilateral pleural effusions lying dependently. 4. Aortic atherosclerosis. 5. Tracheobronchomalacia. 6. Cholelithiasis. 7. Colonic diverticulosis."  2) Rigors from Rituxan - needing demerol as premedication. Rituxan rate to be  capped at $RemoveB'150mg'cgskUsoO$ /hour  3) Thrombocytopenia from chemotherapy  4) Neutropenia due to recent chemotherapy  5) anemia  6) significant new abdominal distention due to lymphoma recurrence  7) new hypercalcemia- ?  Related to dehydration and  Lymphoma -resolved at this time  8) acute hypoxic respiratory failure-multifactorial due to abdominal distention, PE, and hypertensive emergency  9) left segmental PE  10) AKI  11) tumor lysis syndrome  12) pancytopenia related to chemotherapy and possible sepsis.  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  He received 1 cycle of CHOP with no improvement in his left upper quadrant soft tissue mass and abdominal distention at  this time. -Undergoing radiation to his left upper quadrant soft tissue mass.  Due to complete on 11/24. -Plan to begin acalabrutinib with clinical status improves and his counts improve.  Continuing to hold off at this time.  We will plan to reevaluate him on Monday 11/22. -The patient has received G-CSF already and recommend close monitoring of his CBC with differential.  - Transfuse for hemoglobin less than 8 and platelets less than 30,000 or active bleeding. -GI evaluation has been completed.  Anticoagulation was resumed. Continue heparin for now. When platelet count >50,000, can transition to Lovenox 1 mg/kg BID.  -Received rasburicase on 05/01/2020 and 05/03/2020 for hyperuricemia. Uric acid level has normalized. Continue allopurinol. -Monitor renal function closely and consider nephrology consult if worsening.   -Recommend PT/OT consult.   LOS: 12 days   Mikey Bussing    ADDENDUM  .Patient was Personally and independently interviewed, examined and relevant elements of the history of present illness were reviewed in details and an assessment and plan was created. All elements of the patient's history of present illness , assessment and plan were discussed in details with Mikey Bussing . The above documentation reflects our combined findings assessment and plan.  - gradually improving counts -continue IV Heparin at this time. Consider transition to Lovenox if no bleeding, PLT>50k and renal function stable. -transfuse prn for hgb<8 -continue palliative RT -PT/OT evaluations -will intend to start acalabrutinib hopefully next week if platelets continue to improve. Would start at $Remove'100mg'ImQeVqg$  once daily and then advance if tolerated.  Sullivan Lone MD MS

## 2020-05-11 NOTE — Care Management Important Message (Signed)
Important Message  Patient Details IM Letter given to the Patient. Name: Delmus Warwick MRN: [id number] Date of Birth: March 08, 1951   Medicare Important Message Given:  Yes     Kerin Salen 05/11/2020, 11:11 AM

## 2020-05-11 NOTE — Progress Notes (Addendum)
PROGRESS NOTE    Dean Dixon  WUX:[id number] DOB: [birth year] DOA: 05/18/2020 PCP: Dean Sake, MD   Chief Complaint  Patient presents with  . Groin Swelling  . Shortness of Breath  . Emesis    Brief Narrative:   69 year old male with stage IV mantle cell lymphoma on CHOP presenting with N/V and progressive SOB over the last 2 weeks found to have acute left segmental PE. Admitted to [facility] however developed hypertension 250/100 with respiratory distress with chest pain, unable to tolerate BiPAP and was intubated.  He's been transferred back to [facility].  See previous progress notes and H&P for additional details regarding history.   Significant events 11/07 Admitted to [facility]/ decompensated/ intubated 11/08 Weaned on PSV, lasix with 2.2L UOP  11/09 Extubated  11/11 Fever / leukocytosis improved, on abx  11/12 Mental status improved, on RA, palliative radiation   Assessment & Plan:   Active Problems:   Mantle cell lymphoma (HCC)   Acute on chronic respiratory failure with hypoxia (HCC)   Pulmonary embolism (HCC)   Shortness of breath   Dyspnea and respiratory abnormalities   Intubation of airway performed without difficulty   Pressure injury of skin   Abdominal distension   Tumor lysis syndrome   Antineoplastic chemotherapy induced pancytopenia (HCC)   Tumor lysis syndrome following antineoplastic drug therapy   Neutropenia with fever (HCC)   Acute renal failure (HCC)   Hypernatremia   SOB (shortness of breath)  1 acute hypoxemic respiratory failure  Segmental Left Sided Pulmonary Embolism  Volume overload  Extubated on 11/9 Currently on 2 L Walker Continue heparin for PE - per oncology, continue heparin until platelets >50,000, then can transition to lovenox 1 mg/kg BID (transfuse platelets for <30,000 and Hb <8) - possible transition to lovenox 11/20 if platelets stable Strict I/O, daily weights - not well recorded I/O, weight relatively stable since admission SOB 11/16  with concern for volume overload in setting of pRBC transfusion on 11/15 -> will continue lasix today with continued edema, CXR with possible interstitial edema - follow volume status/renal function - UA without proteinuria, normal LFT's, albumin 2.7 Echo 11/9 with EF 60-65%, no RWMA (see report) CXR 11/18 with bibasilar atelectasis with increasing medial R basilar opacity - atelectasis vs infection (afebrile, will continue to monitor off abx for now) Repeat CXR 11/19 bilateral interstitial prominence -> edema vs pneumonitis   # metastatic mantle cell lymphoma on chemotherapy  Pancytopenia  Tumor Lysis Syndrome Appreciate oncology assistance - Plan to begin acalabrutinib after clinical improvement Receiving radiation to LUQ soft tissue mass S/p 1 cycle of CHOP S/p G-CSF Transfuse for hemoglobin <8 and platelets <30,000  --- s/p 4 units pRBC during hospitalization  S/p rasburicase for hyperuricemia, continue allopurinol   2.  Acute renal failure Baseline creatinine is around 1 Creatinine peaked at 3.1 Stable around 1.79 today, increasing with lasix, relatively stable Will consider asking renal assistance if continued aki with persistent volume overload No hydroureteronephrosis on imaging from 04/28/2020 Follow with intermittent lasix for volume overload  # Abdominal Distention: follow KUB  3.  Hypernatremia Increased D5W  4.  Acute left segmental PE In the setting of metastatic mantle cell lymphoma Lower extremity Dopplers negative for DVT 2D echo with no right ventricular strain Patient assessed by GI and felt patient did not have [name] melanotic stools all GI bleed and as such heparin resumed 05/07/2020 Hematology following, appreciate assistance, continue lovenox until platelets >50,000 then transition to lovenox  5.  Fever/leukopenia  Blood cx 11/9 NGTD Urine cx NG CXR without acute infiltrates Covid negative, flu negative S/p IV abx CXR as noted above, follow repeat CXR  11/19 bilateral interstitial prominence  7.  Concern for melanotic stools ruled out Concern for GI bleed.  Patient was assessed by GI and was felt patient did not have melanotic stools and as such anticoagulation resumed.  Change IV PPI to once daily PO.  See GI note from 11/15. Concern for blood in stools, change back to BID PPI, will discuss with GI in AM, Hb relatively stable, trend, continue heparin for now  10.  Hypokalemia Replace and follow   11.  Prognosis Patient with metastatic mantle cell lymphoma with large soft tissue mass in the left upper quadrant of the abdomen associated with multiple bowel loops. Patient with extensive omental caking.  Patient mantle cell lymphoma resistant to chemotherapy.  Patient with tumor lysis syndrome.  Patient now with PE. Patient with aspiration and was on Lanina Larranaga dysphagia 1 diet.  Patient with Kennley Schwandt poor prognosis.  Patient being followed by palliative care.  Oncology following.  Appreciate assistance.  Rectal tube in place  DVT prophylaxis: heparin Code Status: full  Family Communication: none at bedside - friend Dean Dixon Disposition:   Status is: Inpatient  Remains inpatient appropriate because:Inpatient level of care appropriate due to severity of illness   Dispo: The patient is from: Home              Anticipated d/c is to: pending CIR? SNF?              Anticipated d/c date is: > 3 days              Patient currently is not medically stable to d/c.   Consultants:   GI  Palliative  Oncology  PCCM  Procedures: Echo IMPRESSIONS    1. Cystic structure noted in liver on Subcostal images.  2. Left ventricular ejection fraction, by estimation, is 60 to 65%. The  left ventricle has normal function. The left ventricle has no regional  wall motion abnormalities.  3. Right ventricular systolic function is normal. The right ventricular  size is normal.  4. The pericardial effusion is posterior to the left ventricle.  5. The mitral  valve is normal in structure. No evidence of mitral valve  regurgitation. No evidence of mitral stenosis.  6. The aortic valve is normal in structure. Aortic valve regurgitation is  not visualized. No aortic stenosis is present.  7. The inferior vena cava is normal in size with greater than 50%  respiratory variability, suggesting right atrial pressure of 3 mmHg.   LE [place] Summary:  RIGHT:  - There is no evidence of deep vein thrombosis in the lower extremity.    - No cystic structure found in the popliteal fossa.    LEFT:  - There is no evidence of deep vein thrombosis in the lower extremity.    - No cystic structure found in the popliteal fossa.  Antimicrobials:  Anti-infectives (From admission, onward)   Start     Dose/Rate Route Frequency Ordered Stop   05/03/20 1400  ceFEPIme (MAXIPIME) 2 g in sodium chloride 0.9 % 100 mL IVPB        2 g 200 mL/hr over 30 Minutes Intravenous Every 12 hours 05/03/20 1340 05/06/20 1437   05/02/20 2200  ceFEPIme (MAXIPIME) 2 g in sodium chloride 0.9 % 100 mL IVPB  Status:  Discontinued  2 g 200 mL/hr over 30 Minutes Intravenous Every 24 hours 05/02/20 0800 05/03/20 1340   04/30/20 1200  ceFEPIme (MAXIPIME) 2 g in sodium chloride 0.9 % 100 mL IVPB  Status:  Discontinued        2 g 200 mL/hr over 30 Minutes Intravenous Every 12 hours 04/30/20 1025 05/02/20 0800      Subjective: C/o SOB  Objective: Vitals:   05/10/20 2038 05/10/20 2254 05/11/20 0039 05/11/20 0435  BP: 127/73  118/68 122/72  Pulse: (!) 108 (!) 108 (!) 103 (!) 103  Resp: 15 18 15 15   Temp: 97.7 F (36.5 C)  98.1 F (36.7 C) 98.3 F (36.8 C)  TempSrc: Oral  Oral Oral  SpO2: 95% 96% 98% 99%  Weight: 97.6 kg     Height: 5\' 4"  (1.626 m)       Intake/Output Summary (Last 24 hours) at 05/11/2020 1832 Last data filed at 05/11/2020 1628 Gross per 24 hour  Intake 120 ml  Output --  Net 120 ml   Filed Weights   05/09/20 0419 05/10/20 0500 05/10/20 2038   Weight: 96 kg 104.1 kg 97.6 kg    Examination:  General: No acute distress. Cardiovascular: Heart sounds show Arbor Cohen regular rate, and rhythm Lungs: Clear to auscultation bilaterally  Abdomen: Soft, nontender, nondistended Neurological: Alert and oriented 3. Moves all extremities 4Cranial nerves II through XII grossly intact. Skin: Warm and dry. No rashes or lesions. Extremities: LE edema    Data Reviewed: I have personally reviewed following labs and imaging studies  CBC: Recent Labs  Lab 05/07/20 0551 05/08/20 0300 05/09/20 0427 05/10/20 0500 05/11/20 0723  WBC 2.6* 3.1* 3.1* 3.1* 2.7*  NEUTROABS 1.8 2.4 2.4 2.4 2.0  HGB 7.5* 11.2* 10.6* 10.8* 10.2*  HCT 23.8* 34.9* 32.8* 33.3* 32.7*  MCV 96.0 91.8 92.1 92.2 94.5  PLT 36* 36* 39* 45* 51*    Basic Metabolic Panel: Recent Labs  Lab 05/07/20 0551 05/08/20 0300 05/09/20 0427 05/10/20 0500 05/11/20 0723  NA 148* 148* 146* 146* 147*  K 3.0* 3.6 3.0* 3.2* 4.2  CL 117* 117* 116* 112* 116*  CO2 22 21* 21* 22 21*  GLUCOSE 106* 97 74 107* 122*  BUN 62* 56* 52* 46* 48*  CREATININE 1.48* 1.57* 1.68* 1.71* 1.79*  CALCIUM 7.1* 7.2* 7.3* 7.6* 7.8*  MG 2.5*  --   --  2.1 2.2  PHOS  --   --   --  2.5 3.0    GFR: Estimated Creatinine Clearance: 40.5 mL/min (Nichole Keltner) (by C-G formula based on SCr of 1.79 mg/dL (H)).  Liver Function Tests: Recent Labs  Lab 05/07/20 0551 05/08/20 0300 05/09/20 0427 05/10/20 0500 05/11/20 0723  AST 19 21 18 16 16   ALT 14 15 14 12 12   ALKPHOS 63 69 67 66 69  BILITOT 0.5 0.7 0.6 0.5 0.6  PROT 5.1* 5.3* 5.3* 5.2* 4.9*  ALBUMIN 2.5* 2.8* 2.7* 2.7* 2.6*    CBG: Recent Labs  Lab 05/10/20 2304 05/11/20 0431 05/11/20 0728 05/11/20 1220 05/11/20 1604  GLUCAP 123* 117* 111* 157* 139*     Recent Results (from the past 240 hour(s))  Culture, Urine     Status: None   Collection Time: 05/05/20  7:48 AM   Specimen: Urine, Random  Result Value Ref Range Status   Specimen Description    Final    URINE, RANDOM Performed at [facility], [place] [address], [place], [place] [address]    Special Requests  Final    URINE, RANDOM Performed at [facility], [place] [address], [place], [place] [address]    Culture   Final    NO GROWTH Performed at [facility], [place] [address] [place], [place] [address]    Report Status 05/06/2020 FINAL  Final         Radiology Studies: DG Abd 1 View  Result Date: 05/11/2020 CLINICAL DATA:  Abdominal distension.  Mantle cell lymphoma. EXAM: ABDOMEN - 1 VIEW COMPARISON:  CT of the abdomen and pelvis 05/06/2020 FINDINGS: Stomach is displaced medially secondary to known mass lesion. There is some contrast in the distal small bowel and scattered through the colon, likely secondary to swallowing study 05/04/2020. Lung bases are clear. IMPRESSION: 1. Medial displacement of the stomach secondary to known mass lesion. 2. No acute findings. Electronically Signed   By: San Morelle M.D.   On: 05/11/2020 14:33   DG CHEST PORT 1 VIEW  Result Date: 05/11/2020 CLINICAL DATA:  Atelectasis. EXAM: PORTABLE CHEST 1 VIEW COMPARISON:  05/10/2020.  05/08/2020 FINDINGS: Right PICC line stable position. Heart size stable. Low lung volumes with persistent bibasilar atelectasis. Bilateral interstitial prominence. Interstitial edema and or pneumonitis could present in this fashion. No pleural effusion or pneumothorax. IMPRESSION: 1. Right PICC line in stable position. 2. Low lung volumes with persistent bibasilar atelectasis. Bilateral interstitial prominence. Interstitial edema and/or pneumonitis could present this fashion. Electronically Signed   By: Marcello Moores  Register   On: 05/11/2020 06:55   DG CHEST PORT 1 VIEW  Result Date: 05/10/2020 CLINICAL DATA:  Shortness of breath. Mantle cell lymphoma on chemotherapy EXAM: PORTABLE CHEST 1 VIEW COMPARISON:  05/08/2020 FINDINGS: Interval removal of right IJ approach central  venous catheter. Right-sided PICC line remains in place terminating at the level of the distal SVC. Stable cardiomediastinal contours. Low lung volumes. Bibasilar atelectasis with increasing medial right basilar opacity. No appreciable pleural fluid collection. No pneumothorax. IMPRESSION: Bibasilar atelectasis with increasing medial right basilar opacity, atelectasis versus infection. Electronically Signed   By: Davina Poke D.O.   On: 05/10/2020 08:25        Scheduled Meds: . allopurinol  100 mg Oral BID  . amLODipine  10 mg Per Tube Daily  . Chlorhexidine Gluconate Cloth  6 each Topical Daily  . collagenase   Topical Daily  . feeding supplement  237 mL Oral TID BM  . insulin aspart  0-15 Units Subcutaneous Q4H  . mouth rinse  15 mL Mouth Rinse BID  . multivitamin with minerals  1 tablet Oral Daily  . pantoprazole  40 mg Oral Daily  . polyethylene glycol  17 g Oral BID  . sodium chloride flush  10-40 mL Intracatheter Q12H  . sodium chloride flush  10-40 mL Intracatheter Q12H   Continuous Infusions: . dextrose 75 mL/hr at 05/11/20 1258  . heparin 1,100 Units/hr (05/10/20 1800)     LOS: 12 days    Time spent: over 30 min    Fayrene Helper, MD [facility]   To contact the attending provider between 7A-7P or the covering provider during after hours 7P-7A, please log into the web site [email or web address] and access using universal  password for that web site. If you do not have the password, please call the hospital operator.  05/11/2020, 6:32 PM

## 2020-05-11 NOTE — Progress Notes (Signed)
OT Cancellation Note  Patient Details Name: Dean Dixon MRN: [id number] DOB: [birth year]   Cancelled Treatment:    Reason Eval/Treat Not Completed: Fatigue/lethargy limiting ability to participate. Patient reports "a lot has been going on today" and reports getting ready to go to radiation. Will f/u with patient as able.  [name] [name] 05/11/2020, 2:02 PM

## 2020-05-12 ENCOUNTER — Inpatient Hospital Stay (HOSPITAL_COMMUNITY): Payer: Medicare Other

## 2020-05-12 ENCOUNTER — Inpatient Hospital Stay (HOSPITAL_COMMUNITY): Payer: Medicare Other | Admitting: Certified Registered Nurse Anesthetist

## 2020-05-12 DIAGNOSIS — K921 Melena: Secondary | ICD-10-CM | POA: Diagnosis not present

## 2020-05-12 DIAGNOSIS — R0602 Shortness of breath: Secondary | ICD-10-CM | POA: Diagnosis not present

## 2020-05-12 DIAGNOSIS — I2699 Other pulmonary embolism without acute cor pulmonale: Secondary | ICD-10-CM | POA: Diagnosis not present

## 2020-05-12 DIAGNOSIS — D62 Acute posthemorrhagic anemia: Secondary | ICD-10-CM

## 2020-05-12 DIAGNOSIS — J9601 Acute respiratory failure with hypoxia: Secondary | ICD-10-CM

## 2020-05-12 DIAGNOSIS — J9602 Acute respiratory failure with hypercapnia: Secondary | ICD-10-CM

## 2020-05-12 DIAGNOSIS — R195 Other fecal abnormalities: Secondary | ICD-10-CM | POA: Diagnosis not present

## 2020-05-12 DIAGNOSIS — Z789 Other specified health status: Secondary | ICD-10-CM | POA: Diagnosis not present

## 2020-05-12 DIAGNOSIS — R0600 Dyspnea, unspecified: Secondary | ICD-10-CM | POA: Diagnosis not present

## 2020-05-12 LAB — CBC WITH DIFFERENTIAL/PLATELET
Abs Immature Granulocytes: 0.16 10*3/uL — ABNORMAL HIGH (ref 0.00–0.07)
Basophils Absolute: 0.1 10*3/uL (ref 0.0–0.1)
Basophils Relative: 1 %
Eosinophils Absolute: 0 10*3/uL (ref 0.0–0.5)
Eosinophils Relative: 0 %
HCT: 33.9 % — ABNORMAL LOW (ref 39.0–52.0)
Hemoglobin: 10.7 g/dL — ABNORMAL LOW (ref 13.0–17.0)
Immature Granulocytes: 5 %
Lymphocytes Relative: 4 %
Lymphs Abs: 0.2 10*3/uL — ABNORMAL LOW (ref 0.7–4.0)
MCH: 30 pg (ref 26.0–34.0)
MCHC: 31.6 g/dL (ref 30.0–36.0)
MCV: 95 fL (ref 80.0–100.0)
Monocytes Absolute: 0.4 10*3/uL (ref 0.1–1.0)
Monocytes Relative: 10 %
Neutro Abs: 2.8 10*3/uL (ref 1.7–7.7)
Neutrophils Relative %: 80 %
Platelets: 63 10*3/uL — ABNORMAL LOW (ref 150–400)
RBC: 3.57 MIL/uL — ABNORMAL LOW (ref 4.22–5.81)
RDW: 18.4 % — ABNORMAL HIGH (ref 11.5–15.5)
WBC: 3.6 10*3/uL — ABNORMAL LOW (ref 4.0–10.5)
nRBC: 0 % (ref 0.0–0.2)

## 2020-05-12 LAB — CBC
HCT: 29.1 % — ABNORMAL LOW (ref 39.0–52.0)
Hemoglobin: 9.2 g/dL — ABNORMAL LOW (ref 13.0–17.0)
MCH: 29.8 pg (ref 26.0–34.0)
MCHC: 31.6 g/dL (ref 30.0–36.0)
MCV: 94.2 fL (ref 80.0–100.0)
Platelets: 60 10*3/uL — ABNORMAL LOW (ref 150–400)
RBC: 3.09 MIL/uL — ABNORMAL LOW (ref 4.22–5.81)
RDW: 18.5 % — ABNORMAL HIGH (ref 11.5–15.5)
WBC: 3.1 10*3/uL — ABNORMAL LOW (ref 4.0–10.5)
nRBC: 0 % (ref 0.0–0.2)

## 2020-05-12 LAB — URIC ACID: Uric Acid, Serum: 4.3 mg/dL (ref 3.7–8.6)

## 2020-05-12 LAB — COMPREHENSIVE METABOLIC PANEL
ALT: 13 U/L (ref 0–44)
AST: 18 U/L (ref 15–41)
Albumin: 2.9 g/dL — ABNORMAL LOW (ref 3.5–5.0)
Alkaline Phosphatase: 79 U/L (ref 38–126)
Anion gap: 10 (ref 5–15)
BUN: 53 mg/dL — ABNORMAL HIGH (ref 8–23)
CO2: 22 mmol/L (ref 22–32)
Calcium: 7.9 mg/dL — ABNORMAL LOW (ref 8.9–10.3)
Chloride: 111 mmol/L (ref 98–111)
Creatinine, Ser: 1.83 mg/dL — ABNORMAL HIGH (ref 0.61–1.24)
GFR, Estimated: 39 mL/min — ABNORMAL LOW (ref >60)
Glucose, Bld: 189 mg/dL — ABNORMAL HIGH (ref 70–99)
Potassium: 4.9 mmol/L (ref 3.5–5.1)
Sodium: 143 mmol/L (ref 135–145)
Total Bilirubin: 0.7 mg/dL (ref 0.3–1.2)
Total Protein: 5.4 g/dL — ABNORMAL LOW (ref 6.5–8.1)

## 2020-05-12 LAB — GLUCOSE, CAPILLARY
Glucose-Capillary: 131 mg/dL — ABNORMAL HIGH (ref 70–99)
Glucose-Capillary: 149 mg/dL — ABNORMAL HIGH (ref 70–99)
Glucose-Capillary: 152 mg/dL — ABNORMAL HIGH (ref 70–99)
Glucose-Capillary: 159 mg/dL — ABNORMAL HIGH (ref 70–99)
Glucose-Capillary: 176 mg/dL — ABNORMAL HIGH (ref 70–99)
Glucose-Capillary: 84 mg/dL (ref 70–99)

## 2020-05-12 LAB — TRIGLYCERIDES
Triglycerides: 575 mg/dL — ABNORMAL HIGH (ref <150)
Triglycerides: 326 mg/dL — ABNORMAL HIGH (ref <150)

## 2020-05-12 LAB — BLOOD GAS, ARTERIAL
Acid-base deficit: 6.8 mmol/L — ABNORMAL HIGH (ref 0.0–2.0)
Bicarbonate: 22.4 mmol/L (ref 20.0–28.0)
FIO2: 100
O2 Content: 15 L/min
O2 Saturation: 95 %
Patient temperature: 99.2
pCO2 arterial: 68.3 mmHg (ref 32.0–48.0)
pH, Arterial: 7.146 — CL (ref 7.350–7.450)
pO2, Arterial: 93 mmHg (ref 83.0–108.0)

## 2020-05-12 LAB — BLOOD GAS, VENOUS
Acid-base deficit: 4 mmol/L — ABNORMAL HIGH (ref 0.0–2.0)
Bicarbonate: 21.8 mmol/L (ref 20.0–28.0)
FIO2: 21
O2 Saturation: 79.2 %
Patient temperature: 98.6
pCO2, Ven: 46.2 mmHg (ref 44.0–60.0)
pH, Ven: 7.296 (ref 7.250–7.430)
pO2, Ven: 45.1 mmHg — ABNORMAL HIGH (ref 32.0–45.0)

## 2020-05-12 LAB — LACTIC ACID, PLASMA: Lactic Acid, Venous: 1 mmol/L (ref 0.5–1.9)

## 2020-05-12 MED ORDER — PROPOFOL 10 MG/ML IV BOLUS
INTRAVENOUS | Status: DC | PRN
  Administered 2020-05-12: 100 mg via INTRAVENOUS

## 2020-05-12 MED ORDER — CHLORHEXIDINE GLUCONATE 0.12% ORAL RINSE (MEDLINE KIT)
15.0000 mL | Freq: Two times a day (BID) | OROMUCOSAL | Status: DC
  Administered 2020-05-12 – 2020-05-21 (×19): 15 mL via OROMUCOSAL

## 2020-05-12 MED ORDER — PROSOURCE TF PO LIQD
90.0000 mL | Freq: Four times a day (QID) | ORAL | Status: DC
  Administered 2020-05-12 – 2020-05-15 (×12): 90 mL
  Filled 2020-05-12 (×13): qty 90

## 2020-05-12 MED ORDER — FENTANYL CITRATE (PF) 100 MCG/2ML IJ SOLN
25.0000 ug | INTRAMUSCULAR | Status: DC | PRN
  Administered 2020-05-12: 50 ug via INTRAVENOUS
  Administered 2020-05-12 – 2020-05-13 (×4): 100 ug via INTRAVENOUS
  Administered 2020-05-13: 50 ug via INTRAVENOUS
  Administered 2020-05-14: 100 ug via INTRAVENOUS
  Administered 2020-05-14 (×2): 50 ug via INTRAVENOUS
  Administered 2020-05-14 (×2): 100 ug via INTRAVENOUS
  Filled 2020-05-12 (×11): qty 2

## 2020-05-12 MED ORDER — PROSOURCE TF PO LIQD
45.0000 mL | Freq: Two times a day (BID) | ORAL | Status: DC
  Administered 2020-05-12: 45 mL
  Filled 2020-05-12: qty 45

## 2020-05-12 MED ORDER — ADULT MULTIVITAMIN LIQUID CH
15.0000 mL | Freq: Every day | ORAL | Status: DC
  Administered 2020-05-12 – 2020-05-18 (×5): 15 mL
  Filled 2020-05-12 (×7): qty 15

## 2020-05-12 MED ORDER — VITAL HIGH PROTEIN PO LIQD: 1000.0000 mL | ORAL | Status: DC

## 2020-05-12 MED ORDER — ALLOPURINOL 100 MG PO TABS
100.0000 mg | ORAL_TABLET | Freq: Two times a day (BID) | ORAL | Status: DC
  Administered 2020-05-12 – 2020-05-18 (×10): 100 mg
  Filled 2020-05-12 (×12): qty 1

## 2020-05-12 MED ORDER — SODIUM CHLORIDE 0.9% IV SOLUTION: 250.0000 mL | Freq: Once | INTRAVENOUS | Status: DC

## 2020-05-12 MED ORDER — SODIUM CHLORIDE 0.9% FLUSH: 10.0000 mL | INTRAVENOUS | Status: DC | PRN

## 2020-05-12 MED ORDER — SUCCINYLCHOLINE CHLORIDE 20 MG/ML IJ SOLN
INTRAMUSCULAR | Status: DC | PRN
  Administered 2020-05-12: 100 mg via INTRAVENOUS

## 2020-05-12 MED ORDER — ACETAMINOPHEN 325 MG PO TABS: 650.0000 mg | ORAL_TABLET | Freq: Once | ORAL | Status: DC

## 2020-05-12 MED ORDER — HEPARIN SOD (PORK) LOCK FLUSH 100 UNIT/ML IV SOLN
500.0000 [IU] | Freq: Every day | INTRAVENOUS | Status: DC | PRN
  Filled 2020-05-12: qty 5

## 2020-05-12 MED ORDER — VITAL AF 1.2 CAL PO LIQD
1000.0000 mL | ORAL | Status: DC
  Administered 2020-05-12: 1000 mL

## 2020-05-12 MED ORDER — FUROSEMIDE 10 MG/ML IJ SOLN
60.0000 mg | Freq: Once | INTRAMUSCULAR | Status: AC
  Administered 2020-05-12: 60 mg via INTRAVENOUS
  Filled 2020-05-12: qty 6

## 2020-05-12 MED ORDER — POLYETHYLENE GLYCOL 3350 17 G PO PACK
17.0000 g | PACK | Freq: Two times a day (BID) | ORAL | Status: DC
  Filled 2020-05-12: qty 1

## 2020-05-12 MED ORDER — PROPOFOL 1000 MG/100ML IV EMUL
5.0000 ug/kg/min | INTRAVENOUS | Status: DC
  Administered 2020-05-12: 30 ug/kg/min via INTRAVENOUS
  Administered 2020-05-12: 10 ug/kg/min via INTRAVENOUS
  Administered 2020-05-12: 30 ug/kg/min via INTRAVENOUS
  Administered 2020-05-12: 20 ug/kg/min via INTRAVENOUS
  Administered 2020-05-13 (×3): 30 ug/kg/min via INTRAVENOUS
  Administered 2020-05-13 – 2020-05-14 (×2): 20 ug/kg/min via INTRAVENOUS
  Administered 2020-05-14: 30 ug/kg/min via INTRAVENOUS
  Administered 2020-05-14 (×2): 20 ug/kg/min via INTRAVENOUS
  Administered 2020-05-14 – 2020-05-15 (×3): 30 ug/kg/min via INTRAVENOUS
  Filled 2020-05-12 (×15): qty 100

## 2020-05-12 MED ORDER — SODIUM CHLORIDE 0.9% FLUSH: 3.0000 mL | INTRAVENOUS | Status: DC | PRN

## 2020-05-12 MED ORDER — ROCURONIUM BROMIDE 100 MG/10ML IV SOLN
INTRAVENOUS | Status: DC | PRN
  Administered 2020-05-12: 70 mg via INTRAVENOUS

## 2020-05-12 MED ORDER — ORAL CARE MOUTH RINSE
15.0000 mL | OROMUCOSAL | Status: DC
  Administered 2020-05-12 – 2020-05-21 (×85): 15 mL via OROMUCOSAL

## 2020-05-12 MED ORDER — HEPARIN SOD (PORK) LOCK FLUSH 100 UNIT/ML IV SOLN
250.0000 [IU] | INTRAVENOUS | Status: DC | PRN
  Filled 2020-05-12: qty 2.5

## 2020-05-12 MED ORDER — METHYLPREDNISOLONE SODIUM SUCC 40 MG IJ SOLR: 40.0000 mg | Freq: Once | INTRAMUSCULAR | Status: DC

## 2020-05-12 MED ORDER — METOLAZONE 2.5 MG PO TABS
2.5000 mg | ORAL_TABLET | Freq: Once | ORAL | Status: AC
  Administered 2020-05-12: 2.5 mg via ORAL
  Filled 2020-05-12: qty 1

## 2020-05-12 NOTE — Consult Note (Addendum)
NAME:  Dean Dixon, MRN:  [id number], DOB:  02-01-1951, LOS: 57 ADMISSION DATE:  05/14/2020, CONSULTATION DATE:  11/7/201 REFERRING MD:  [facility], CHIEF COMPLAINT:  Respiratory distress  Brief History   69 year old male with stage IV mantle cell lymphoma on CHOP presenting with N/V and progressive SOB over the last 2 weeks found to have acute left segmental PE, intubated 11/7-11/9 in setting hypertensive emergency/flash, reintubated 11/20 for multifactorial acute hypercapnic hypoxic respiratory failure   History of present illness   HPI obtained from medical chart review as patient is currently intubated and sedated on mechanical ventilation.  69 year old male with prior medical history of stage IV mantle cell lymphoma (diagnosed 06/20/2019) on CHOP, hypertension, diabetes type 2 who [facility] with progressive shortness of breath over the last 2 weeks, worse over the last 2 days with above baseline abdominal distention, non bloody nausea/ vomiting, nonradiating intermittent midsternal chest tightness, and cough with lightly productive greenish nonbloody sputum.  Additionally reported ongoing swelling in his legs and testicles causing difficulty with urination.  Recent hospitalization 10/28 - 11/1 found to have new left upper quadrant abdominal mass and hypercalcemia.    In ER, he was afebrile, hypertensive, mildly tachypneic, and tachycardic with oxygen saturations of 98% on room air.  Noted to have multiple episodes of vomiting and retching treated with zofran.  KUB with non-obstructive gas pattern.   Labs noted for glucose 290 with HgbA1c 6.3, BUN 40, AG 17, lactic acid 3.2-> 2.3, troponin hs 52, BNP 133, D-dimer 2.47, Hgb 11.5/ HCT 35.5, platelets 100, neg COVID/ flu, UA noted turbid urine with 5 ketones, negative for leukocytes/ nitrates.  CXR showed small bilateral pleural effusion with atelectasis and low lung volumes.  He was pretreated given his contrast allergy. CTA PE and  CT abd/ pelvis showed segmental nonocclusive left pulmonary embolism without associated hemorrhage or pulmonary infarct.  Additionally noted bulky soft tissue mass in LUQ with extensive lymphadenopathy in the chest and abd with evidence of widespread intraperitoneal metastatic disease, small bilateral pleural effusion, and  tracheobronchomalacia. Started on heparin gtt per pharmacy.   He was admitted by [facility] to progressive care.  In unit, he was noted to become hypertensive with BP 250/100 with complaints of chest discomfort with noted tachypnea and lethargy.  He was treated with hydralazine and placed on nitro gtt for blood pressure management.  Repeated EKG was non acute.  ABG on NRB noted 7.291/ 56.7/ 275.  Bipap was attempted however patient kept pulling it off and therefore given concern for impending respiratory failure, he was intubated by anesthesia.  PCCM consulted for further management.   Past Medical History  Mantle cell lymphoma on CHOP, DM, HTN  Significant Hospital Events   11/07 Admitted to [facility]/ decompensated/ intubated 11/08 Weaned on PSV, lasix with 2.2L UOP  11/09 Extubated  11/11 Fever / leukocytosis improved, on abx  11/12 Mental status improved, on RA, palliative radiation  11/20 Reintubated for acute hypercapnic hypoxic respiratory failure  Consults:  PCCM 11/7, 11/20  Procedures:  ETT 11/7 >> 11/9 TLC R IJ 11/8 >> 11/12 ETT 11/19 >>  Significant Diagnostic Tests:  11/7 KUB >> nonobstructive gas pattern 11/7 CTA PE/  CT abd/ pelvis >> Study is positive for segmental sized pulmonary emboli in the left lung. These appear nonocclusive at this time, and there is no associated pulmonary hemorrhage to suggest frank pulmonary infarct. Bulky soft tissue mass in the left upper quadrant with extensive lymphadenopathy in the  chest and abdomen, and evidence of widespread intraperitoneal metastatic disease. Small bilateral pleural effusions lying dependently. Aortic atherosclerosis.   Tracheobronchomalacia. Cholelithiasis. Colonic diverticulosis. 11/8 [place] Ascites/ABD >> small volume ascites, insufficient for paracentesis, extensive omental caking and peritoneal nodularity consistent with peritoneal carcinomatosis  11/8 ECHO >> cystic structure noted in liver on subcostal images, LVEF 60-65%, no RWMA, RV systolic function normal, pericardial effusion is posterior to LV 11/8 LE Doppler >> negative for DVT 11/19 CXR bilateral effusions, atelectasis  Micro Data:  SARS 2/ flu 11/7 >> neg MRSA 11/7 >> neg UA 11/7 >> negative, 5 ketones BCx2 11/9 >>   Antimicrobials:  Previously on azithro/ ceftriaxone, stopped 10/30 Cefepime 11/8 >> 11/14  Interim history/subjective:   Increased work of breathing and intermittent blood-tinged sputum throughout the night, heparin gtt continued. Developed progressively more bloody cough (though total reportedly <<1cup) and work of breathing and ultimately intubated by anesthesia prior to my arrival. There have been <25-50cc bloody secretions in ETT since intubation.  Objective   Blood pressure 139/80, pulse (!) 115, temperature 98.2 F (36.8 C), temperature source Axillary, resp. rate 20, height 5\' 4"  (1.626 m), weight 97.6 kg, SpO2 96 %.    Vent Mode: PRVC FiO2 (%):  [60 %] 60 % Set Rate:  [22 bmp] 22 bmp Vt Set:  [470 mL] 470 mL Pressure Support:  [5 cmH20] 5 cmH20 Plateau Pressure:  [28 cmH20] 28 cmH20   Intake/Output Summary (Last 24 hours) at 05/12/2020 0145 Last data filed at 05/11/2020 2113 Gross per 24 hour  Intake 120 ml  Output 600 ml  Net -480 ml   Filed Weights   05/09/20 0419 05/10/20 0500 05/10/20 2038  Weight: 96 kg 104.1 kg 97.6 kg   Physical Exam: General: elderly male lying in bed sedated, intubated HEENT: MM pink/dry, anicteric Neuro: Awake / alert, oriented, MAE / generalized weakness  CV: tachycardic, no m/r/g PULM: coarse breath sounds, lungs bilaterally clear GI: distended but not taut, bsx4 active    Extremities: warm/dry, 2+ BLE edema  Skin: no rashes or lesions  Bedside US shows right pleural effusion, ascites above the liver but likely not tappable, largely posterior pericardial effusion (I think fluid anterior to heart in subcostal view is probably his ascites), RV/LV ratio <1.   Resolved Hospital Problem list    DKA Hypercalcemia TLS  Assessment & Plan:   Acute hypoxic hypercapnic respiratory failure: Likely related predominantly to extravascular volume overload with effusions and pulmonary edema. LUQ mass, ascites impairing respiratory mechanics and contributing to atelectasis with V/Q mismatch/shunt. His hemoptysis seems relatively small volume so far this evening and at least echocardiographically it doesn't appear that his PE burden is newly worse. - hold heparin gtt for now, re-evaluate for starting in AM on high-bleeding risk protocol with lower PTT goal - metolazone and lasix in setting of hypernatremia and extravascular volume overload. Will need to monitor electrolytes and renal function closely in setting prior TLS - remains afebrile, will continue to hold ABX for now - lung protective ventilation - target rass 0 to -1 ideally with prn fentanyl, if requires frequent boluses add fentanyl gtt. Wean propofol off as able.   Acute Left segmental pulmonary embolism  In setting of known malignancy -holding heparin gtt for now as above   Hypernatremia: - can stop D5, start free water flushes per OG 200cc q4 - trial of metolazone with lasix as above for balanced effect on Na  Type 2 diabetes CBG range 139-173 -continue SSI   Acute  kidney injury Hypernatremia -can stop D5w, start free water flushes as above -Trend BMP / urinary output -Replace electrolytes as indicated -Avoid nephrotoxic agents, ensure adequate renal perfusion  Pancytopenia Suspect in the setting of chemotherapy -holding heparin gtt temporarily as above   Stage IV mantle cell lymphoma with LUQ  mass/ extensive metastatic disease Last CHOP 10/30, follows with Dr. Irene Limbo -appreciate ONC input  -palliative radiation  -appreciate palliative care input > full scope care -PT consult / mobilize   Best practice:  Diet: NPO Pain/Anxiety/Delirium protocol (if indicated): PAD protocol  VAP protocol (if indicated): Yes DVT prophylaxis: Systemic heparin on hold temporarily GI prophylaxis: PPI Glucose control: CBG q4h Mobility: BR Code Status: Full code Family Communication:To be updated in AM disposition: ICU  Labs   CBC: Recent Labs  Lab 05/07/20 0551 05/07/20 0551 05/08/20 0300 05/09/20 0427 05/10/20 0500 05/11/20 0723 05/11/20 2044  WBC 2.6*  --  3.1* 3.1* 3.1* 2.7*  --   NEUTROABS 1.8  --  2.4 2.4 2.4 2.0  --   HGB 7.5*   < > 11.2* 10.6* 10.8* 10.2* 9.9*  HCT 23.8*   < > 34.9* 32.8* 33.3* 32.7* 30.7*  MCV 96.0  --  91.8 92.1 92.2 94.5  --   PLT 36*  --  36* 39* 45* 51*  --    < > = values in this interval not displayed.    Basic Metabolic Panel: Recent Labs  Lab 05/07/20 0551 05/08/20 0300 05/09/20 0427 05/10/20 0500 05/11/20 0723  NA 148* 148* 146* 146* 147*  K 3.0* 3.6 3.0* 3.2* 4.2  CL 117* 117* 116* 112* 116*  CO2 22 21* 21* 22 21*  GLUCOSE 106* 97 74 107* 122*  BUN 62* 56* 52* 46* 48*  CREATININE 1.48* 1.57* 1.68* 1.71* 1.79*  CALCIUM 7.1* 7.2* 7.3* 7.6* 7.8*  MG 2.5*  --   --  2.1 2.2  PHOS  --   --   --  2.5 3.0   GFR: Estimated Creatinine Clearance: 40.5 mL/min (A) (by C-G formula based on SCr of 1.79 mg/dL (H)). Recent Labs  Lab 05/08/20 0300 05/09/20 0427 05/10/20 0500 05/11/20 0723  WBC 3.1* 3.1* 3.1* 2.7*    Liver Function Tests: Recent Labs  Lab 05/07/20 0551 05/08/20 0300 05/09/20 0427 05/10/20 0500 05/11/20 0723  AST 19 21 18 16 16   ALT 14 15 14 12 12   ALKPHOS 63 69 67 66 69  BILITOT 0.5 0.7 0.6 0.5 0.6  PROT 5.1* 5.3* 5.3* 5.2* 4.9*  ALBUMIN 2.5* 2.8* 2.7* 2.7* 2.6*   No results for input(s): LIPASE, AMYLASE in the  last 168 hours. No results for input(s): AMMONIA in the last 168 hours.  ABG    Component Value Date/Time   PHART 7.146 (LL) 05/12/2020 0112   PCO2ART 68.3 (HH) 05/12/2020 0112   PO2ART 93.0 05/12/2020 0112   HCO3 22.4 05/12/2020 0112   TCO2 29 04/30/2020 2146   ACIDBASEDEF 6.8 (H) 05/12/2020 0112   O2SAT 95.0 05/12/2020 0112     Coagulation Profile: No results for input(s): INR, PROTIME in the last 168 hours.  Cardiac Enzymes: No results for input(s): CKTOTAL, CKMB, CKMBINDEX, TROPONINI in the last 168 hours.  HbA1C: Hgb A1c MFr Bld  Date/Time Value Ref Range Status  04/25/2020 05:49 PM 6.3 (H) 4.8 - 5.6 % Final    Comment:    (NOTE) Pre diabetes:          5.7%-6.4%  Diabetes:              >  6.4%  Glycemic control for   <7.0% adults with diabetes   11/14/2019 06:13 AM 5.4 4.8 - 5.6 % Final    Comment:    (NOTE) Pre diabetes:          5.7%-6.4% Diabetes:              >6.4% Glycemic control for   <7.0% adults with diabetes     CBG: Recent Labs  Lab 05/11/20 0728 05/11/20 1220 05/11/20 1604 05/11/20 2003 05/11/20 2349  GLUCAP 111* 157* 139* 143* 152*   This patient is critically ill with multiple organ system failure; which, requires frequent high complexity decision making, assessment, support, evaluation, and titration of therapies. This was completed through the application of advanced monitoring technologies and extensive interpretation of multiple databases. During this encounter critical care time was devoted to patient care services described in this note for 30 minutes.

## 2020-05-12 NOTE — Progress Notes (Signed)
OT Cancellation Note  Patient Details Name: Dean Dixon MRN: [id number] DOB: 1951/05/07   Cancelled Treatment:    Reason Eval/Treat Not Completed: Medical issues which prohibited therapy. Patient transferred to ICU and emergently intubated. OT will sign off for now. Please re-consult when patient medically stable for therapy.  Makenlee Mckeag L Monaye Blackie 05/12/2020, 6:46 AM

## 2020-05-12 NOTE — Anesthesia Procedure Notes (Signed)
Procedure Name: Intubation Date/Time: 05/12/2020 1:15 AM Performed by: Gerald Leitz, CRNA Pre-anesthesia Checklist: Patient identified, Patient being monitored, Timeout performed, Emergency Drugs available and Suction available Patient Re-evaluated:Patient Re-evaluated prior to induction Oxygen Delivery Method: Circle system utilized Preoxygenation: Pre-oxygenation with 100% oxygen Induction Type: IV induction Ventilation: Mask ventilation without difficulty Laryngoscope Size: Mac and 3 Grade View: Grade I Tube type: Oral Tube size: 7.5 mm Number of attempts: 1 Airway Equipment and Method: Stylet and Video-laryngoscopy Placement Confirmation: ETT inserted through vocal cords under direct vision,  positive ETCO2 and breath sounds checked- equal and bilateral Secured at: 24 cm Tube secured with: Tape Dental Injury: Teeth and Oropharynx as per pre-operative assessment

## 2020-05-12 NOTE — Progress Notes (Signed)
NAME:  Dean Dixon, MRN:  [id number], DOB:  05/09/1951, LOS: 76 ADMISSION DATE:  05/20/2020, CONSULTATION DATE:  11/7/201 REFERRING MD:  [facility], CHIEF COMPLAINT:  Respiratory distress  Brief History   69 year old male with stage IV mantle cell lymphoma on CHOP presenting with N/V and progressive SOB over the last 2 weeks found to have acute left segmental PE, intubated 11/7-11/9 in setting hypertensive emergency/flash, reintubated 11/20 for multifactorial acute hypercapnic hypoxic respiratory failure   History of present illness   HPI obtained from medical chart review as patient is currently intubated and sedated on mechanical ventilation.  69 year old male with prior medical history of stage IV mantle cell lymphoma (diagnosed 06/20/2019) on CHOP, hypertension, diabetes type 2 who presented to [facility] on 11/7 with progressive shortness of breath over the last 2 weeks, worse over the last 2 days with above baseline abdominal distention, non bloody nausea/ vomiting, nonradiating intermittent midsternal chest tightness, and cough with lightly productive greenish nonbloody sputum.  Additionally reported ongoing swelling in his legs and testicles causing difficulty with urination.  Recent hospitalization 10/28 - 11/1 found to have new left upper quadrant abdominal mass and hypercalcemia.    In ER, he was afebrile, hypertensive, mildly tachypneic, and tachycardic with oxygen saturations of 98% on room air.  Noted to have multiple episodes of vomiting and retching treated with zofran.  KUB with non-obstructive gas pattern.   Labs noted for glucose 290 with HgbA1c 6.3, BUN 40, AG 17, lactic acid 3.2-> 2.3, troponin hs 52, BNP 133, D-dimer 2.47, Hgb 11.5/ HCT 35.5, platelets 100, neg COVID/ flu, UA noted turbid urine with 5 ketones, negative for leukocytes/ nitrates.  CXR showed small bilateral pleural effusion with atelectasis and low lung volumes.  He was pretreated given his contrast allergy. CTA PE and  CT abd/ pelvis showed segmental nonocclusive left pulmonary embolism without associated hemorrhage or pulmonary infarct.  Additionally noted bulky soft tissue mass in LUQ with extensive lymphadenopathy in the chest and abd with evidence of widespread intraperitoneal metastatic disease, small bilateral pleural effusion, and  tracheobronchomalacia. Started on heparin gtt per pharmacy.   He was admitted by [facility] to progressive care.  In unit, he was noted to become hypertensive with BP 250/100 with complaints of chest discomfort with noted tachypnea and lethargy.  He was treated with hydralazine and placed on nitro gtt for blood pressure management.  Repeated EKG was non acute.  ABG on NRB noted 7.291/ 56.7/ 275.  Bipap was attempted however patient kept pulling it off and therefore given concern for impending respiratory failure, he was intubated by anesthesia.  PCCM consulted for further management.   Past Medical History  Mantle cell lymphoma on CHOP, DM, HTN  Significant Hospital Events   11/07 Admitted to [facility]/ decompensated/ intubated 11/08 Weaned on PSV, lasix with 2.2L UOP  11/09 Extubated  11/11 Fever / leukocytosis improved, on abx  11/12 Mental status improved, on RA, palliative radiation  11/20 Reintubated for acute hypercapnic hypoxic respiratory failure  Consults:  PCCM 11/7, 11/20  Procedures:  ETT 11/7 >> 11/9 TLC R IJ 11/8 >> 11/12 ETT 11/19 >>  Significant Diagnostic Tests:  11/7 KUB >> nonobstructive gas pattern 11/7 CTA PE/  CT abd/ pelvis >> Study is positive for segmental sized pulmonary emboli in the left lung. These appear nonocclusive at this time, and there is no associated pulmonary hemorrhage to suggest frank pulmonary infarct. Bulky soft tissue mass in the left upper quadrant with extensive lymphadenopathy in the  chest and abdomen, and evidence of widespread intraperitoneal metastatic disease. Small bilateral pleural effusions lying dependently. Aortic atherosclerosis.   Tracheobronchomalacia. Cholelithiasis. Colonic diverticulosis. 11/8 [place] Ascites/ABD >> small volume ascites, insufficient for paracentesis, extensive omental caking and peritoneal nodularity consistent with peritoneal carcinomatosis  11/8 ECHO >> cystic structure noted in liver on subcostal images, LVEF 60-65%, no RWMA, RV systolic function normal, pericardial effusion is posterior to LV 11/8 LE Doppler >> negative for DVT 11/19 CXR bilateral effusions, atelectasis  Micro Data:  SARS 2/ flu 11/7 >> neg MRSA 11/7 >> neg UA 11/7 >> negative, 5 ketones BCx2 11/9 >>   Antimicrobials:  Previously on azithro/ ceftriaxone, stopped 10/30 Cefepime 11/8 >> 11/14  Interim history/subjective:  Hemoptysis improved today compared to the hemoptysis with clots he had yesterday. Heparin has been held overnight. He nods to deny complaints.  Objective   Blood pressure (!) 111/55, pulse 96, temperature 99.5 F (37.5 C), temperature source Bladder, resp. rate 16, height 5\' 4"  (1.626 m), weight 97.6 kg, SpO2 97 %.    Vent Mode: PSV;CPAP FiO2 (%):  [40 %-60 %] 40 % Set Rate:  [22 bmp] 22 bmp Vt Set:  [470 mL] 470 mL PEEP:  [5 cmH20] 5 cmH20 Pressure Support:  [5 cmH20-8 cmH20] 8 cmH20 Plateau Pressure:  [18 cmH20-28 cmH20] 18 cmH20   Intake/Output Summary (Last 24 hours) at 05/12/2020 1014 Last data filed at 05/12/2020 0900 Gross per 24 hour  Intake 549.95 ml  Output 860 ml  Net -310.05 ml   Filed Weights   05/10/20 0500 05/10/20 2038 05/12/20 0458  Weight: 104.1 kg 97.6 kg 97.6 kg   Physical Exam: General: Critically ill-appearing man intubated, sedated HEENT: [place]/AT, endotracheal tube in place Neuro: RASS -2, nods to answer questions but quickly falls back asleep.  Spontaneously moving extremities. CV: Regular rate and rhythm, no murmurs PULM: Thin bloody secretions from endotracheal tube, small in volume.  Faint rhonchi bilaterally.  Breathing comfortably on SBT 8/5 GI: Obese, soft,  nontender Extremities: No clubbing or cyanosis, mild pretracheal edema. Skin: Blood blister on his shin, ecchymosis in left arm, around right PICC.  CXR personally reviewed-endotracheal tube 1 cm from the carina.  Bilateral dependent layering pleural effusions.  Resolved Hospital Problem list    DKA Hypercalcemia TLS  Assessment & Plan:   Acute hypoxic & hypercapnic respiratory failure: Likely related predominantly to extravascular volume overload with effusions and pulmonary edema. LUQ mass, ascites impairing respiratory mechanics and contributing to atelectasis with V/Q mismatch/shunt. His hemoptysis seems improved.  It doesn't appear that his PE burden is newly worse based on bedside ultrasound overnight, stable heart rate and blood pressure. Hemoptysis likely due to lung infarction from PE. Intubated for acute hypercapnia, large blood clots suctioned out prior to intubation, potentially precluding adequate ventilation. -Continue holding heparin today. -Continue diuresis and free water flushes.  Additional dose of Lasix this afternoon. -Continue low tidal volume ventilation, 4 to 8 cc/kg ideal body weight with goal plateau less than 30 and driving pressure less than 15.  Titrate down PEEP and FiO2 as able per ARDS protocol. -Daily SAT and SBT.  Monitoring for volume of hemoptysis.  His lung disease continues to improve off anticoagulation, no need for bronchoscopy.  If this worsens, will require bronchoscopy for localization.  He will remain intubated today. -Target RASS 0 to -1, fentanyl and propofol.  Recheck triglycerides today.   Acute left segmental pulmonary embolism  In setting of known malignancy Has history of previous need for Coumadin  about 20 years ago-unsure if this was an unprovoked VTE in the past -Holding heparin for now; hemodynamics seem stable to allow for holding anticoagulation while bleeding is resolving  Hypernatremia-resolved -Continue free water  flushes -Additional Lasix -Strict I's/O -Avoid high sodium containing IVF  Type 2 diabetes with hyperglycemia CBG range 139-173 -SSI as needed -Adding Lantus 8 units daily. -Goal BG 140-180 while admitted to the ICU  Acute kidney injury Hypernatremia -Free water -Continue to monitor -Strict I's/O -Renally dose meds and avoid nephrotoxic meds  Pancytopenia likely due to chemotherapy -Holding heparin gtt temporarily as above  -Repeat CBC  Stage IV mantle cell lymphoma with LUQ mass/ extensive metastatic disease Last CHOP 10/30, follows with Dr. Irene Limbo -appreciate ONC input  -palliative radiation  -appreciate palliative care input > full scope care -PT consult / mobilize   [facility] -I discussed the patient's care with his friend and medical power of attorney Dean Dixon and Dean Dixon.  He had been doing very well over the summer, tolerating a cruise in the [place].  Over the last month prior to admission he had deteriorating functional status, but lives alone and had been continuing to care for himself.  He is an " eternal optimist" and would want to continue aggressive therapy.  He would not want his final days in life to be spent on a ventilator.  They understand my concerns regarding prolonged admission and repeat hospital admission with background progressive cancer despite aggressive therapy.  They are aware that he would require significant rehabilitation prior to being discharged, but feel that he would want to continue aggressive care at this time.  We discussed the potential need for bronchoscopy if his hemoptysis worsens, which they verbally consented to.  They will be out of town for few days but plan to come back on Wednesday.  Best practice:  Diet: NPO Pain/Anxiety/Delirium protocol (if indicated): PAD protocol  VAP protocol (if indicated): Yes DVT prophylaxis: Systemic heparin on hold temporarily GI prophylaxis: PPI Glucose control: CBG q4h Mobility: BR Code Status:  Full code Family Communication:To be updated in AM disposition: ICU  Labs   CBC: Recent Labs  Lab 05/08/20 0300 05/08/20 0300 05/09/20 0427 05/10/20 0500 05/11/20 0723 05/11/20 2044 05/12/20 0116  WBC 3.1*  --  3.1* 3.1* 2.7*  --  3.6*  NEUTROABS 2.4  --  2.4 2.4 2.0  --  2.8  HGB 11.2*   < > 10.6* 10.8* 10.2* 9.9* 10.7*  HCT 34.9*   < > 32.8* 33.3* 32.7* 30.7* 33.9*  MCV 91.8  --  92.1 92.2 94.5  --  95.0  PLT 36*  --  39* 45* 51*  --  63*   < > = values in this interval not displayed.    Basic Metabolic Panel: Recent Labs  Lab 05/07/20 0551 05/07/20 0551 05/08/20 0300 05/09/20 0427 05/10/20 0500 05/11/20 0723 05/12/20 0115  NA 148*   < > 148* 146* 146* 147* 143  K 3.0*   < > 3.6 3.0* 3.2* 4.2 4.9  CL 117*   < > 117* 116* 112* 116* 111  CO2 22   < > 21* 21* 22 21* 22  GLUCOSE 106*   < > 97 74 107* 122* 189*  BUN 62*   < > 56* 52* 46* 48* 53*  CREATININE 1.48*   < > 1.57* 1.68* 1.71* 1.79* 1.83*  CALCIUM 7.1*   < > 7.2* 7.3* 7.6* 7.8* 7.9*  MG 2.5*  --   --   --  2.1 2.2  --   PHOS  --   --   --   --  2.5 3.0  --    < > = values in this interval not displayed.   GFR: Estimated Creatinine Clearance: 39.6 mL/min (A) (by C-G formula based on SCr of 1.83 mg/dL (H)). Recent Labs  Lab 05/09/20 0427 05/10/20 0500 05/11/20 0723 05/12/20 0116  WBC 3.1* 3.1* 2.7* 3.6*  LATICACIDVEN  --   --   --  1.0    Liver Function Tests: Recent Labs  Lab 05/08/20 0300 05/09/20 0427 05/10/20 0500 05/11/20 0723 05/12/20 0115  AST 21 18 16 16 18   ALT 15 14 12 12 13   ALKPHOS 69 67 66 69 79  BILITOT 0.7 0.6 0.5 0.6 0.7  PROT 5.3* 5.3* 5.2* 4.9* 5.4*  ALBUMIN 2.8* 2.7* 2.7* 2.6* 2.9*   No results for input(s): LIPASE, AMYLASE in the last 168 hours. No results for input(s): AMMONIA in the last 168 hours.  ABG    Component Value Date/Time   PHART 7.146 (LL) 05/12/2020 0112   PCO2ART 68.3 (HH) 05/12/2020 0112   PO2ART 93.0 05/12/2020 0112   HCO3 21.8 05/12/2020 0750    TCO2 29 05/14/2020 2146   ACIDBASEDEF 4.0 (H) 05/12/2020 0750   O2SAT 79.2 05/12/2020 0750     Coagulation Profile: No results for input(s): INR, PROTIME in the last 168 hours.  Cardiac Enzymes: No results for input(s): CKTOTAL, CKMB, CKMBINDEX, TROPONINI in the last 168 hours.  HbA1C: Hgb A1c MFr Bld  Date/Time Value Ref Range Status  04/25/2020 05:49 PM 6.3 (H) 4.8 - 5.6 % Final    Comment:    (NOTE) Pre diabetes:          5.7%-6.4%  Diabetes:              >6.4%  Glycemic control for   <7.0% adults with diabetes   11/14/2019 06:13 AM 5.4 4.8 - 5.6 % Final    Comment:    (NOTE) Pre diabetes:          5.7%-6.4% Diabetes:              >6.4% Glycemic control for   <7.0% adults with diabetes     CBG: Recent Labs  Lab 05/11/20 1604 05/11/20 2003 05/11/20 2349 05/12/20 0250 05/12/20 0758  GLUCAP 139* 143* 152* 176* 84    This patient is critically ill with multiple organ system failure which requires frequent high complexity decision making, assessment, support, evaluation, and titration of therapies. This was completed through the application of advanced monitoring technologies and extensive interpretation of multiple databases. During this encounter critical care time was devoted to patient care services described in this note for 58 minutes.  Julian Hy, DO 05/12/20 11:19 AM [facility]

## 2020-05-12 NOTE — Progress Notes (Addendum)
[facility] Progress Note  CC:  GI bleed  Subjective: This patient was seen by our service in GI consult on 11/15.  Please see that consult note for further details.  Anyway, at that point there was concern for GI bleeding in the setting of heparin use.  GI procedures were deferred as risk significantly outweighed the benefit at that point.  We are being called back again for concern of GI bleeding.  Hemoglobin was 10.7 grams in the night and is 9.2 grams this AM.  He was intubated yesterday for either aspiration of volume overload.  His nurse says that it was noted that he either coughed or vomited some blood yesterday then when he was intubated he had a clot that was removed before intubation was performed.  BMs are very dark brown to blackish, not necessarily black tarry/melena and does not smell like blood.  She says that she is suctioning some red blood from his mouth.    Objective:  Vital signs in last 24 hours: Temp:  [97.6 F (36.4 C)-100 F (37.8 C)] 99.5 F (37.5 C) (11/20 1100) Pulse Rate:  [93-118] 94 (11/20 1105) Resp:  [16-30] 20 (11/20 1105) BP: (90-166)/(50-85) 115/58 (11/20 1105) SpO2:  [91 %-100 %] 97 % (11/20 1105) FiO2 (%):  [40 %-60 %] 40 % (11/20 1105) Weight:  [97.6 kg] 97.6 kg (11/20 0458) Last BM Date: 05/12/20 General:  Intubated and sedated, chronically ill-appearing. Heart:  Regular rate and rhythm; no murmurs Pulm:  CTAB. Abdomen:  Soft, non-distended.  BS present.  Fullness/mas palpated in the LUQ. Extremities:  Without edema.  Intake/Output from previous day: 11/19 0701 - 11/20 0700 In: 120 [P.O.:120] Out: 860 [Urine:860] Intake/Output this shift: Total I/O In: 465.2 [I.V.:465.2] Out: -   Lab Results: Recent Labs    05/10/20 0500 05/10/20 0500 05/11/20 0723 05/11/20 2044 05/12/20 0116  WBC 3.1*  --  2.7*  --  3.6*  HGB 10.8*   < > 10.2* 9.9* 10.7*  HCT 33.3*   < > 32.7* 30.7* 33.9*  PLT 45*  --  51*  --  63*   < > =  values in this interval not displayed.   BMET Recent Labs    05/10/20 0500 05/11/20 0723 05/12/20 0115  NA 146* 147* 143  K 3.2* 4.2 4.9  CL 112* 116* 111  CO2 22 21* 22  GLUCOSE 107* 122* 189*  BUN 46* 48* 53*  CREATININE 1.71* 1.79* 1.83*  CALCIUM 7.6* 7.8* 7.9*   LFT Recent Labs    05/12/20 0115  PROT 5.4*  ALBUMIN 2.9*  AST 18  ALT 13  ALKPHOS 79  BILITOT 0.7   DG Abd 1 View  Result Date: 05/12/2020 CLINICAL DATA:  Initial evaluation for enteric tube placement. EXAM: ABDOMEN - 1 VIEW COMPARISON:  None. FINDINGS: Enteric tube in place with tip overlying the region of the distal stomach/pylorus. Side hole well beyond the GE junction. Tip projects distally. Visualized bowel gas pattern is nonobstructive. Bilateral pleural effusions with associated bibasilar opacities noted within the visualized lungs, better evaluated on concomitant chest radiograph. IMPRESSION: Enteric tube in place with tip overlying the region of the distal stomach/pylorus. Side hole well beyond the GE junction. Tip projects distally. Electronically Signed   By: Jeannine Boga M.D.   On: 05/12/2020 03:58   DG Abd 1 View  Result Date: 05/11/2020 CLINICAL DATA:  Abdominal distension.  Mantle cell lymphoma. EXAM: ABDOMEN - 1 VIEW COMPARISON:  CT of the  abdomen and pelvis 05/16/2020 FINDINGS: Stomach is displaced medially secondary to known mass lesion. There is some contrast in the distal small bowel and scattered through the colon, likely secondary to swallowing study 05/04/2020. Lung bases are clear. IMPRESSION: 1. Medial displacement of the stomach secondary to known mass lesion. 2. No acute findings. Electronically Signed   By: San Morelle M.D.   On: 05/11/2020 14:33   DG CHEST PORT 1 VIEW  Result Date: 05/12/2020 CLINICAL DATA:  Initial evaluation for intubation. EXAM: PORTABLE CHEST 1 VIEW COMPARISON:  Prior radiograph from 05/11/2020. FINDINGS: Endotracheal tube in place with tip  positioned approximately 1.3 cm above the carina. Enteric tube courses into the abdomen, tip not visualized. Cardiac and mediastinal silhouettes are stable. Right-sided PICC catheter in place with tip terminating near the cavoatrial junction. Veiling opacities overlying the bilateral hemidiaphragms consistent with pleural effusions. Associated bibasilar opacities favored to reflect atelectasis and/or effusion, although infiltrates could be considered in the correct clinical setting. Perihilar vascular congestion without overt pulmonary edema. No pneumothorax. No acute osseous finding. IMPRESSION: 1. Endotracheal tube in place with tip positioned approximately 1.3 cm above the carina. 2. Veiling opacities overlying the bilateral hemidiaphragms, consistent with pleural effusions. Associated bibasilar opacities favored to reflect atelectasis and/or effusion, although infiltrates could be considered in the correct clinical setting. Electronically Signed   By: Jeannine Boga M.D.   On: 05/12/2020 03:57   DG CHEST PORT 1 VIEW  Result Date: 05/11/2020 CLINICAL DATA:  Shortness of breath EXAM: PORTABLE CHEST 1 VIEW COMPARISON:  05/11/2020 FINDINGS: Layering bilateral pleural effusions. Bibasilar atelectasis or infiltrates. Heart is normal size. Right PICC line is in place with the tip in the SVC. IMPRESSION: Layering bilateral pleural effusions with bibasilar atelectasis or infiltrates. Findings similar to prior study. Electronically Signed   By: Rolm Baptise M.D.   On: 05/11/2020 22:14   DG CHEST PORT 1 VIEW  Result Date: 05/11/2020 CLINICAL DATA:  Atelectasis. EXAM: PORTABLE CHEST 1 VIEW COMPARISON:  05/10/2020.  05/08/2020 FINDINGS: Right PICC line stable position. Heart size stable. Low lung volumes with persistent bibasilar atelectasis. Bilateral interstitial prominence. Interstitial edema and or pneumonitis could present in this fashion. No pleural effusion or pneumothorax. IMPRESSION: 1. Right PICC  line in stable position. 2. Low lung volumes with persistent bibasilar atelectasis. Bilateral interstitial prominence. Interstitial edema and/or pneumonitis could present this fashion. Electronically Signed   By: Marcello Moores  Register   On: 05/11/2020 06:55   Assessment / Plan: 57.  69 year old male with mantle cell lymphoma with a large soft tissue mass to the LUQ with widespread intraperitoneal metastatic disease with reports of dark stool and positive FOBT.  Hgb 10.7 grams-->9.2 grams.  ? GI bleeding vs pulmonary source.  Certainly may have GI involvement as CT shows multiple bowel loops with intimate association with the large LUQ mass. -I do not forsee any plans for endoscopic procedures but will await input from Dr. Henrene Pastor. -Continue to monitor H&H closely -Transfuse for hemoglobin less than 7  -Continue PPI IV   2.  Pulmonary embolism, left lung -Heparin on hold again at this point.  3.  Thrombocytopenia secondary to chemo.  Platelet count 60.  4.  Heme positive stool  5. Multifactorial anemia, largely from malignancy and CTX-induced marrow suppression.    LOS: 13 days   Laban Emperor. Zehr  05/12/2020, 11:42 AM  GI ATTENDING  Interval history data reviewed.  Agree with interval progress note as outlined above.  Extremely difficult case.  Is not clear  that he is having clinically significant GI bleeding.  Pulmonary etiology is being considered.  At this point you can irrigate the NG tube to see if there is fresh blood.  Monitor stools for gross melena or frank hematochezia.  Continue PPI.  Transfuse as needed.  Overall poor prognosis.  We are aware of him and available if needed and could perform heroic endoscopy for significant overt bleeding, if needed.  Thanks.  Docia Chuck. Geri Seminole., M.D. [facility] Division of Gastroenterology

## 2020-05-12 NOTE — Progress Notes (Signed)
Called by bedside RN with concerns for SOB, tachycardia, and worsening AMS. On assessment, pt is awake and responds to verbal stimuli. He is pale, diaphoretic, lethargic and SOB. Using accessory muscles to breath. Sats remain in low 90's on 5L. RT sunctioning at bedside. Bloody secretions noted. Patient transferred to ICU. PCCM consulted and emergently intubated at bedside.   Acute hypoxemic respiratory failure  Segmental Left Sided Pulmonary Embolism  Volume overload - Intubated and sedated - Chest xray  - ABG - CBC, CMP, LA.  - Decision to hold heparin gtt for now d/t increased blooy secretions that pt appears to be aspirating on.  - Previously given lasix.   Lovey Newcomer, NP [facility] [phone] [phone]  CRITICAL CARE Performed by: Neila Gear   Total critical care time: 45 minutes  Critical care time was exclusive of separately billable procedures and treating other patients.  Critical care was necessary to treat or prevent imminent or life-threatening deterioration.  Critical care was time spent personally by me on the following activities: development of treatment plan with patient and/or surrogate as well as nursing, discussions with consultants, evaluation of patient's response to treatment, examination of patient, obtaining history from patient or surrogate, ordering and performing treatments and interventions, ordering and review of laboratory studies, ordering and review of radiographic studies, pulse oximetry and re-evaluation of patient's condition.

## 2020-05-12 NOTE — Progress Notes (Signed)
RRT called d/t pt having AMS f/c but now not verbally answering questions.  Pt clearly looks much different than earlier.  More bloody noted in suction canister about 150cc.  Diaphoretic with increased WOB noted.  [facility], NP notified Pt needs immediate change of level of care.  RT at the bedside.  Pt transferring to ICU/SD per [facility], NP.  See new orders and flow sheet for VS. Pt transferred to room 1235 ICU RN given report.

## 2020-05-12 NOTE — Progress Notes (Addendum)
Nutrition Follow-up  RD working remotely.  DOCUMENTATION CODES:   Obesity unspecified  INTERVENTION:  Due to shortage of Vital High Protein will change to Vital AF 1.2 Cal. MD changed order to initiate feeds at 10 mL/hr and then advance to 20 mL/hr after 6 hours if tolerating.   Initiate Vital AF 1.2 Cal at 10 mL/hr and advance to 20 mL/hr after 6 hours if tolerating per MD.  Provide PROSource TF 90 mL QID per tube.  Provides 896 kcal, 124 grams of protein, 389 mL H2O daily. With current propofol rate provides 1362 kcal daily.  Provide MVI daily per tube.  Free water flush per MD.  NUTRITION DIAGNOSIS:   Increased nutrient needs related to chronic illness, cancer and cancer related treatments, acute illness as evidenced by estimated needs.  Ongoing.  GOAL:   Patient will meet greater than or equal to 90% of their needs  Met with TF regimen.  MONITOR:   PO intake, Supplement acceptance, Labs, Weight trends  REASON FOR ASSESSMENT:   Ventilator, Consult Enteral/tube feeding initiation and management  ASSESSMENT:   Pt with stage IV mantle cell lymphoma on CHOP presented with N/V and dyspnea x2 weeks found to have acute left segmental PE. PMH also includes DM and HTN.  11/7 intubated 11/9 extubated 11/13 diet advanced to dysphagia 1 with honey thick 11/20 reintubated for acute hypercapnic hypoxic respiratory failure  Patient is currently intubated on ventilator support MV: 10 L/min Temp (24hrs), Avg:99.3 F (37.4 C), Min:97.6 F (36.4 C), Max:100 F (37.8 C)  Propofol: 17.64 ml/hr (466 kcal daily)  Medications reviewed and include: allopurinol, Lasix 60 mg once today IV, Novolog 0-15 units Q4hrs, Solu-Medrol 40 mg once today IV, MVI daily, Protonix, Miralax 17 grams BID, heparin infusion, propofol gtt.  Labs reviewed: CBG 84-176, BUN 53, Creatinine 1.83.  I/O: 860 mL UOP yesterday (0.4 mL/kg/hr) + 2 occurrences unmeasured UOP  Weight trend: 97.6 kg on  11/20; +2.7 kg overall from 11/7 (wt has fluctuated during admission)  Enteral Access: NG vs OG (tube not yet documented) that terminates in distal stomach/pylorus per abdominal x-ray today  Patient ordered for Vital High Protein at trickle rate of 20 mL/hr by MD + PROsource TF 45 mL BID. Will utilize PROSource TF to meet patient's protein needs.  Diet Order:   Diet Order            DIET DYS 2 Room service appropriate? Yes; Fluid consistency: Nectar Thick  Diet effective now                EDUCATION NEEDS:   No education needs have been identified at this time  Skin:  Skin Assessment: Skin Integrity Issues: Skin Integrity Issues:: DTI, Other (Comment) DTI: coccyx Stage II: N/A Other: MASD to bilateral buttocks  Last BM:  05/12/2020 - medium type 6  Height:   Ht Readings from Last 1 Encounters:  05/12/20 $RemoveB'5\' 4"'ZBTooXiU$  (1.626 m)   Weight:   Wt Readings from Last 1 Encounters:  05/12/20 97.6 kg   Ideal Body Weight:  59.1 kg  BMI:  Body mass index is 36.93 kg/m.  Estimated Nutritional Needs:   Kcal:  7371-0626  Protein:  >/= 118 grams  Fluid:  1.5-1.8 L/day  Jacklynn Barnacle, MS, RD, LDN Pager number available on Amion

## 2020-05-12 NOTE — Care Plan (Signed)
Pt rec'd from [facility] after a rapid response. Heparin gtt infusing at 1100 u/hr and d5w @ 31ml/hr. Pt arrived to room via bed, on NRB at 15L satting 100%. Shortly after arrival, pt sats decreased to 78% and was audibly gurgling. NT suctioned by RT, sats improved. Pt's mental status decreased significantly during the fist few minutes in the room. ABG drawn, pending results. Per hopsitalist NP X. Blount, pt requires intubation. ELink RN [employer] notified, ground team dispatched. Anesthesia notified for emergent intubation. Pt intubated by anesthesia.   Meds given:  100 propofol @ 0117  100 succinylcholine  @ 0117  70 rocuronium @ 0123  Intubated @ 0120  7.5 ETT 25 @ lip

## 2020-05-12 NOTE — Progress Notes (Signed)
Pt NT suctioned for copious amounts of thin, bloody secretions.  Pt tolerated well, no apparent complications noted.

## 2020-05-12 NOTE — Progress Notes (Addendum)
IV mantle cell lymphoma on CHOP presenting with N/V and progressive SOB over the last 2 weeks found to have acute left segmental PE. He was initially admitted to [facility] service, but decompensated requiring intubation.  He was transferred back to [facility] and had been getting intermittently diuresed for volume overload as well as treated with heparin for his PE.  He decompensated overnight 11/19-20.  Per discussion with RN and review of chart, it appears he may have had hemoptysis vs hematemesis with aspiration event and worsening volume overload? (see critical care notes and RN notes from overnight as well as previous progress notes for additional details regarding prior Pavle Wiler&P).  Heparin currently on hold.  PCCM to take over care.  Given concern for blood in stool yesterday, will reconsult gastroenterology.  Discussed case with Dr. Carlis Abbott.  Will sign off, please let us know if any questions or concerns and when we can be of assistance again.  [facility].

## 2020-05-12 NOTE — Progress Notes (Signed)
Rapid Response Event Note   Reason for Call :  Called to evaluate pt coughing up blood.   Initial Focused Assessment:  Pt found to be A/O and F/C.  Fluid in suction canister is pink-tinged diluted about 100cc.  Pt is on a Heparin gtt at 11 ml/hr IV.  Pt is on 4.5 L [place].  Lung sounds were rhonchi per pt RN but seems to be sounding much better. Pt has a recent hgb of 9.9 down from 10.2    Interventions: See flow sheet for VS.  [facility], NP made aware of Pt condition. PCXR per NP orders.  Results reviewed per NP.  Pt has already had a dose of lasix prior.  Pt states "I feel better now" WOB is much better also. No more coughing up blood noted at this time. RT to evaluate as well.   Plan of Care:  Pt will remain in current location per [facility], NP instructed bedside RN to call if she has anymore issues.     Dyann Ruddle, RN

## 2020-05-12 NOTE — Progress Notes (Signed)
Physical Therapy Discharge Patient Details Name: Arrion Broaddus MRN: [id number] DOB: 21-Jun-1951 Today's Date: 05/12/2020 Time:  -     Patient discharged from PT services secondary to medical decline - will need to re-order PT to resume therapy services.  Please see latest therapy progress note for current level of functioning and progress toward goals.         Claretha Cooper 05/12/2020, 6:53 AM [place] Pager [phone] Office [phone]

## 2020-05-12 NOTE — Progress Notes (Signed)
eLink Physician-Brief Progress Note Patient Name: Dean Dixon DOB: 1950/10/26 MRN: [id number]   Date of Service  05/12/2020  HPI/Events of Note  Patient emergently intubated.  eICU Interventions  Propofol infusion + PRN bolus Fentanyl ordered for sedation on the ventilator.        Toy Samarin U [place] 05/12/2020, 1:30 AM

## 2020-05-12 NOTE — Anesthesia Preprocedure Evaluation (Signed)
Anesthesia Evaluation  Patient identified by MRN, date of birth, ID band Patient awake  Preop documentation limited or incomplete due to emergent nature of procedure.  Airway Mallampati: II  TM Distance: >3 FB Neck ROM: Full  Mouth opening: Limited Mouth Opening  Dental  (+) Teeth Intact Prominent incisors:   Pulmonary shortness of breath and at rest, pneumonia,    + rhonchi   unstable     Cardiovascular Exercise Tolerance: Poor hypertension, +CHF   Rhythm:Regular Rate:Tachycardia     Neuro/Psych    GI/Hepatic   Endo/Other  diabetes, Type 1  Renal/GU Renal disease     Musculoskeletal   Abdominal (+) + obese,   Peds  Hematology   Anesthesia Other Findings   Reproductive/Obstetrics                             Anesthesia Physical Anesthesia Plan  ASA: III and emergent  Anesthesia Plan: General   Post-op Pain Management:    Induction: Intravenous  PONV Risk Score and Plan: 1  Airway Management Planned: Video Laryngoscope Planned  Additional Equipment:   Intra-op Plan:   Post-operative Plan:   Informed Consent: I have reviewed the patients History and Physical, chart, labs and discussed the procedure including the risks, benefits and alternatives for the proposed anesthesia with the patient or authorized representative who has indicated his/her understanding and acceptance.       Plan Discussed with:   Anesthesia Plan Comments:         Anesthesia Quick Evaluation

## 2020-05-12 NOTE — Progress Notes (Signed)
[place] Progress Note Patient Name: Dartanian Knaggs DOB: 31-Mar-1951 MRN: [id number]   Date of Service  05/12/2020  HPI/Events of Note  Patient transferred from the floor to [facility] ICU and emergently intubated secondary to impending acute respiratory failure associated with altered mental status.  eICU Interventions  New Patient Evaluation completed. PCCM ground crew requested to see patient in consultation.        Kerry Kass Vanderbilt Ranieri 05/12/2020, 2:42 AM

## 2020-05-13 ENCOUNTER — Inpatient Hospital Stay (HOSPITAL_COMMUNITY): Payer: Medicare Other

## 2020-05-13 DIAGNOSIS — N179 Acute kidney failure, unspecified: Secondary | ICD-10-CM | POA: Diagnosis not present

## 2020-05-13 DIAGNOSIS — J9621 Acute and chronic respiratory failure with hypoxia: Secondary | ICD-10-CM | POA: Diagnosis not present

## 2020-05-13 DIAGNOSIS — J181 Lobar pneumonia, unspecified organism: Secondary | ICD-10-CM

## 2020-05-13 DIAGNOSIS — Z9911 Dependence on respirator [ventilator] status: Secondary | ICD-10-CM | POA: Diagnosis not present

## 2020-05-13 LAB — CBC
HCT: 28.3 % — ABNORMAL LOW (ref 39.0–52.0)
Hemoglobin: 8.9 g/dL — ABNORMAL LOW (ref 13.0–17.0)
MCH: 29.8 pg (ref 26.0–34.0)
MCHC: 31.4 g/dL (ref 30.0–36.0)
MCV: 94.6 fL (ref 80.0–100.0)
Platelets: 67 10*3/uL — ABNORMAL LOW (ref 150–400)
RBC: 2.99 MIL/uL — ABNORMAL LOW (ref 4.22–5.81)
RDW: 18.4 % — ABNORMAL HIGH (ref 11.5–15.5)
WBC: 3.2 10*3/uL — ABNORMAL LOW (ref 4.0–10.5)
nRBC: 0.6 % — ABNORMAL HIGH (ref 0.0–0.2)

## 2020-05-13 LAB — PHOSPHORUS: Phosphorus: 4.5 mg/dL (ref 2.5–4.6)

## 2020-05-13 LAB — GLUCOSE, CAPILLARY
Glucose-Capillary: 102 mg/dL — ABNORMAL HIGH (ref 70–99)
Glucose-Capillary: 105 mg/dL — ABNORMAL HIGH (ref 70–99)
Glucose-Capillary: 106 mg/dL — ABNORMAL HIGH (ref 70–99)
Glucose-Capillary: 113 mg/dL — ABNORMAL HIGH (ref 70–99)
Glucose-Capillary: 146 mg/dL — ABNORMAL HIGH (ref 70–99)
Glucose-Capillary: 165 mg/dL — ABNORMAL HIGH (ref 70–99)

## 2020-05-13 LAB — BASIC METABOLIC PANEL
Anion gap: 9 (ref 5–15)
BUN: 82 mg/dL — ABNORMAL HIGH (ref 8–23)
CO2: 20 mmol/L — ABNORMAL LOW (ref 22–32)
Calcium: 7.8 mg/dL — ABNORMAL LOW (ref 8.9–10.3)
Chloride: 115 mmol/L — ABNORMAL HIGH (ref 98–111)
Creatinine, Ser: 2.93 mg/dL — ABNORMAL HIGH (ref 0.61–1.24)
GFR, Estimated: 22 mL/min — ABNORMAL LOW (ref >60)
Glucose, Bld: 117 mg/dL — ABNORMAL HIGH (ref 70–99)
Potassium: 3.9 mmol/L (ref 3.5–5.1)
Sodium: 144 mmol/L (ref 135–145)

## 2020-05-13 LAB — MAGNESIUM: Magnesium: 2 mg/dL (ref 1.7–2.4)

## 2020-05-13 LAB — URIC ACID: Uric Acid, Serum: 4.7 mg/dL (ref 3.7–8.6)

## 2020-05-13 LAB — MRSA PCR SCREENING: MRSA by PCR: NEGATIVE

## 2020-05-13 MED ORDER — VANCOMYCIN HCL 1250 MG/250ML IV SOLN: 1250.0000 mg | INTRAVENOUS | Status: DC

## 2020-05-13 MED ORDER — SODIUM CHLORIDE 0.9 % IV SOLN
2.0000 g | Freq: Every day | INTRAVENOUS | Status: DC
  Administered 2020-05-13: 2 g via INTRAVENOUS
  Filled 2020-05-13: qty 2

## 2020-05-13 MED ORDER — LACTATED RINGERS IV BOLUS
500.0000 mL | Freq: Once | INTRAVENOUS | Status: AC
  Administered 2020-05-13: 500 mL via INTRAVENOUS

## 2020-05-13 MED ORDER — ALBUMIN HUMAN 25 % IV SOLN
25.0000 g | Freq: Once | INTRAVENOUS | Status: AC
  Administered 2020-05-13: 25 g via INTRAVENOUS
  Filled 2020-05-13: qty 100

## 2020-05-13 MED ORDER — VANCOMYCIN HCL 1500 MG/300ML IV SOLN
1500.0000 mg | Freq: Once | INTRAVENOUS | Status: AC
  Administered 2020-05-13: 1500 mg via INTRAVENOUS
  Filled 2020-05-13: qty 300

## 2020-05-13 NOTE — Progress Notes (Signed)
NAME:  Dean Dixon, MRN:  [id number], DOB:  Jan 24, 1951, LOS: 58 ADMISSION DATE:  05/04/2020, CONSULTATION DATE:  11/7/201 REFERRING MD:  [facility], CHIEF COMPLAINT:  Respiratory distress  Brief History   69 year old male with stage IV mantle cell lymphoma on CHOP presenting with N/V and progressive SOB over the last 2 weeks found to have acute left segmental PE, intubated 11/7-11/9 in setting hypertensive emergency/flash, reintubated 11/20 for multifactorial acute hypercapnic hypoxic respiratory failure   History of present illness   HPI obtained from medical chart review as patient is currently intubated and sedated on mechanical ventilation.  69 year old male with prior medical history of stage IV mantle cell lymphoma (diagnosed 06/20/2019) on CHOP, hypertension, diabetes type 2 who presented to [facility] on 11/7 with progressive shortness of breath over the last 2 weeks, worse over the last 2 days with above baseline abdominal distention, non bloody nausea/ vomiting, nonradiating intermittent midsternal chest tightness, and cough with lightly productive greenish nonbloody sputum.  Additionally reported ongoing swelling in his legs and testicles causing difficulty with urination.  Recent hospitalization 10/28 - 11/1 found to have new left upper quadrant abdominal mass and hypercalcemia.    In ER, he was afebrile, hypertensive, mildly tachypneic, and tachycardic with oxygen saturations of 98% on room air.  Noted to have multiple episodes of vomiting and retching treated with zofran.  KUB with non-obstructive gas pattern.   Labs noted for glucose 290 with HgbA1c 6.3, BUN 40, AG 17, lactic acid 3.2-> 2.3, troponin hs 52, BNP 133, D-dimer 2.47, Hgb 11.5/ HCT 35.5, platelets 100, neg COVID/ flu, UA noted turbid urine with 5 ketones, negative for leukocytes/ nitrates.  CXR showed small bilateral pleural effusion with atelectasis and low lung volumes.  He was pretreated given his contrast allergy. CTA PE and  CT abd/ pelvis showed segmental nonocclusive left pulmonary embolism without associated hemorrhage or pulmonary infarct.  Additionally noted bulky soft tissue mass in LUQ with extensive lymphadenopathy in the chest and abd with evidence of widespread intraperitoneal metastatic disease, small bilateral pleural effusion, and  tracheobronchomalacia. Started on heparin gtt per pharmacy.   He was admitted by [facility] to progressive care.  In unit, he was noted to become hypertensive with BP 250/100 with complaints of chest discomfort with noted tachypnea and lethargy.  He was treated with hydralazine and placed on nitro gtt for blood pressure management.  Repeated EKG was non acute.  ABG on NRB noted 7.291/ 56.7/ 275.  Bipap was attempted however patient kept pulling it off and therefore given concern for impending respiratory failure, he was intubated by anesthesia.  PCCM consulted for further management.   Past Medical History  Mantle cell lymphoma on CHOP, DM, HTN  Significant Hospital Events   11/07 Admitted to [facility]/ decompensated/ intubated 11/08 Weaned on PSV, lasix with 2.2L UOP  11/09 Extubated  11/11 Fever / leukocytosis improved, on abx  11/12 Mental status improved, on RA, palliative radiation  11/20 Reintubated for acute hypercapnic hypoxic respiratory failure  Consults:  PCCM 11/7, 11/20  Procedures:  ETT 11/7 >> 11/9 TLC R IJ 11/8 >> 11/12 ETT 11/19 >>  Significant Diagnostic Tests:  11/7 KUB >> nonobstructive gas pattern 11/7 CTA PE/  CT abd/ pelvis >> Study is positive for segmental sized pulmonary emboli in the left lung. These appear nonocclusive at this time, and there is no associated pulmonary hemorrhage to suggest frank pulmonary infarct. Bulky soft tissue mass in the left upper quadrant with extensive lymphadenopathy in the  chest and abdomen, and evidence of widespread intraperitoneal metastatic disease. Small bilateral pleural effusions lying dependently. Aortic atherosclerosis.   Tracheobronchomalacia. Cholelithiasis. Colonic diverticulosis. 11/8 [place] Ascites/ABD >> small volume ascites, insufficient for paracentesis, extensive omental caking and peritoneal nodularity consistent with peritoneal carcinomatosis  11/8 ECHO >> cystic structure noted in liver on subcostal images, LVEF 60-65%, no RWMA, RV systolic function normal, pericardial effusion is posterior to LV 11/8 LE Doppler >> negative for DVT 11/19 CXR bilateral effusions, atelectasis  Micro Data:  SARS 2/ flu 11/7 >> neg MRSA 11/7 >> neg UA 11/7 >> negative, 5 ketones BCx2 11/9 >>  Trach aspirate 11/21>  Antimicrobials:  Previously on azithro/ ceftriaxone, stopped 10/30 Cefepime 11/8 >> 11/14  Interim history/subjective:  No ongoing hemoptysis. Distended today. Requiring frequent suctioning for thick ET secretions.  Objective   Blood pressure 119/60, pulse (!) 101, temperature 100 F (37.8 C), resp. rate (!) 23, height 5\' 4"  (1.626 m), weight 100.8 kg, SpO2 97 %.    Vent Mode: PSV;CPAP FiO2 (%):  [40 %] 40 % Set Rate:  [22 bmp] 22 bmp Vt Set:  [470 mL] 470 mL PEEP:  [5 cmH20] 5 cmH20 Pressure Support:  [5 cmH20-8 cmH20] 5 cmH20 Plateau Pressure:  [17 cmH20-18 cmH20] 18 cmH20   Intake/Output Summary (Last 24 hours) at 05/13/2020 0948 Last data filed at 05/13/2020 0802 Gross per 24 hour  Intake 781.18 ml  Output 815 ml  Net -33.82 ml   Filed Weights   05/10/20 2038 05/12/20 0458 05/13/20 0500  Weight: 97.6 kg 97.6 kg 100.8 kg   Physical Exam: General: Chronically ill-appearing man lying in bed intubated, lightly sedated HEENT: [place]/AT, ETT in place. Neuro: RASS -2.  Nodding to answer questions, but falls back asleep quickly.  Globally weak. CV: Regular rate and rhythm, no murmur PULM: Tan, thick secretions of endotracheal tube.  Tolerating SBT 6/5, but requiring very frequent suctioning. GI: Distended, soft, nontender. Extremities: Mild pretibial edema, no clubbing or cyanosis. Skin: No  rashes, ecchymosis unchanged  CXR personally revieweed> layering effusions bilaterally.  No acute changes.  Endotracheal tube in appropriate position.  Resolved Hospital Problem list    DKA Hypercalcemia TLS  Assessment & Plan:   Acute hypoxic & hypercapnic respiratory failure: Likely related predominantly to extravascular volume overload with effusions and pulmonary edema. LUQ mass, ascites impairing respiratory mechanics and contributing to atelectasis with V/Q mismatch/shunt. His hemoptysis seems improved.  It doesn't appear that his PE burden is newly worse based on bedside ultrasound overnight, stable heart rate and blood pressure. Hemoptysis likely due to lung infarction from PE, less likely due to an infection, though purulent drainage today raises concern for pneumonia. Intubated for acute hypercapnia, large blood clots suctioned out prior to intubation, potentially precluding adequate ventilation. Likely HAP- febrile overnight, remains leukopenic. -Continue holding heparin today. -Holding additional diuresis given AKI -Continue low tidal volume ventilation, 4 to 8 cc/kg ideal body weight with goal plateau less than 30 and driving pressure less than 15.  Titrate down PEEP and FiO2 as able per ARDS protocol. -Daily SAT and SBT.  Monitoring for volume of hemoptysis.  His lung disease continues to improve off anticoagulation, no need for bronchoscopy.  If this worsens, will require bronchoscopy for localization.  He will remain intubated today. -Target RASS 0 to -1, fentanyl and propofol.  Periodic monitoring of triglycerides. -Start empiric antibiotics- vanc + cefepime   Acute left segmental pulmonary embolism  In setting of known malignancy Has history of previous need for  Coumadin about 20 years ago-unsure if this was an unprovoked VTE in the past -Continue holding heparin for bleeding  Hypernatremia-resolved -Continue free water flushes -Holding additional Lasix; 500 cc bolus  plus albumin -Strict I's/O -Avoid high sodium containing IVF  Type 2 diabetes with hyperglycemia -SSI as needed -Continue Lantus 8 units daily. -Goal BG 140-180 while admitted to the ICU  Acute kidney injury-worsened with diuresis Hypernatremia-resolved -Continue Free water -Continue to monitor -Strict I's/O -Renally dose meds and avoid nephrotoxic meds  Pancytopenia likely due to chemotherapy -Holding heparin gtt temporarily as above  -Continue to monitor  -Transfuse for hemoglobin less than 7 or hemodynamically significant bleeding  Stage IV mantle cell lymphoma with LUQ mass/ extensive metastatic disease Last CHOP 10/30, follows with Dr. Irene Limbo -appreciate ONC input  -palliative radiation  -appreciate palliative care input > full scope care -PT consult / mobilize when stable  History of tumor lysis syndrome-uric acid level stable -Continue to monitor  GoC -I discussed the patient's care with his friend and medical power of attorney Nicole Kindred and [place] wife.  He had been doing very well over the summer, tolerating a cruise in the [place].  Over the last month prior to admission he had deteriorating functional status, but lives alone and had been continuing to care for himself.  He is an " eternal optimist" and would want to continue aggressive therapy.  He would not want his final days in life to be spent on a ventilator.  They understand my concerns regarding prolonged admission and repeat hospital admission with background progressive cancer despite aggressive therapy.  They are aware that he would require significant rehabilitation prior to being discharged, but feel that he would want to continue aggressive care at this time.  We discussed the potential need for bronchoscopy if his hemoptysis worsens, which they verbally consented to.  They will be out of town for few days but plan to come back on Wednesday.   Best practice:  Diet: NPO Pain/Anxiety/Delirium protocol  (if indicated): PAD protocol  VAP protocol (if indicated): Yes DVT prophylaxis: Systemic heparin on hold GI prophylaxis: PPI Glucose control: CBG q4h Mobility: BR Code Status: Full code Family Communication: friends updated at bedside 11/21 disposition: ICU  Labs   CBC: Recent Labs  Lab 05/08/20 0300 05/08/20 0300 05/09/20 0427 05/09/20 0427 05/10/20 0500 05/11/20 0723 05/11/20 2044 05/12/20 0116 05/12/20 1111  WBC 3.1*   < > 3.1*  --  3.1* 2.7*  --  3.6* 3.1*  NEUTROABS 2.4  --  2.4  --  2.4 2.0  --  2.8  --   HGB 11.2*   < > 10.6*   < > 10.8* 10.2* 9.9* 10.7* 9.2*  HCT 34.9*   < > 32.8*   < > 33.3* 32.7* 30.7* 33.9* 29.1*  MCV 91.8   < > 92.1  --  92.2 94.5  --  95.0 94.2  PLT 36*   < > 39*  --  45* 51*  --  63* 60*   < > = values in this interval not displayed.    Basic Metabolic Panel: Recent Labs  Lab 05/07/20 0551 05/07/20 0551 05/08/20 0300 05/09/20 0427 05/10/20 0500 05/11/20 0723 05/12/20 0115 05/13/20 0549  NA 148*   < > 148* 146* 146* 147* 143  --   K 3.0*   < > 3.6 3.0* 3.2* 4.2 4.9  --   CL 117*   < > 117* 116* 112* 116* 111  --   CO2 22   < >  21* 21* 22 21* 22  --   GLUCOSE 106*   < > 97 74 107* 122* 189*  --   BUN 62*   < > 56* 52* 46* 48* 53*  --   CREATININE 1.48*   < > 1.57* 1.68* 1.71* 1.79* 1.83*  --   CALCIUM 7.1*   < > 7.2* 7.3* 7.6* 7.8* 7.9*  --   MG 2.5*  --   --   --  2.1 2.2  --  2.0  PHOS  --   --   --   --  2.5 3.0  --  4.5   < > = values in this interval not displayed.   GFR: Estimated Creatinine Clearance: 40.3 mL/min (A) (by C-G formula based on SCr of 1.83 mg/dL (H)). Recent Labs  Lab 05/10/20 0500 05/11/20 0723 05/12/20 0116 05/12/20 1111  WBC 3.1* 2.7* 3.6* 3.1*  LATICACIDVEN  --   --  1.0  --     Liver Function Tests: Recent Labs  Lab 05/08/20 0300 05/09/20 0427 05/10/20 0500 05/11/20 0723 05/12/20 0115  AST 21 18 16 16 18   ALT 15 14 12 12 13   ALKPHOS 69 67 66 69 79  BILITOT 0.7 0.6 0.5 0.6 0.7  PROT  5.3* 5.3* 5.2* 4.9* 5.4*  ALBUMIN 2.8* 2.7* 2.7* 2.6* 2.9*   No results for input(s): LIPASE, AMYLASE in the last 168 hours. No results for input(s): AMMONIA in the last 168 hours.  ABG    Component Value Date/Time   PHART 7.146 (LL) 05/12/2020 0112   PCO2ART 68.3 (HH) 05/12/2020 0112   PO2ART 93.0 05/12/2020 0112   HCO3 21.8 05/12/2020 0750   TCO2 29 05/17/2020 2146   ACIDBASEDEF 4.0 (H) 05/12/2020 0750   O2SAT 79.2 05/12/2020 0750     Coagulation Profile: No results for input(s): INR, PROTIME in the last 168 hours.  Cardiac Enzymes: No results for input(s): CKTOTAL, CKMB, CKMBINDEX, TROPONINI in the last 168 hours.  HbA1C: Hgb A1c MFr Bld  Date/Time Value Ref Range Status  04/28/2020 05:49 PM 6.3 (H) 4.8 - 5.6 % Final    Comment:    (NOTE) Pre diabetes:          5.7%-6.4%  Diabetes:              >6.4%  Glycemic control for   <7.0% adults with diabetes   11/14/2019 06:13 AM 5.4 4.8 - 5.6 % Final    Comment:    (NOTE) Pre diabetes:          5.7%-6.4% Diabetes:              >6.4% Glycemic control for   <7.0% adults with diabetes     CBG: Recent Labs  Lab 05/12/20 1551 05/12/20 2048 05/13/20 0033 05/13/20 0348 05/13/20 0750  GLUCAP 159* 131* 113* 105* 165*    This patient is critically ill with multiple organ system failure which requires frequent high complexity decision making, assessment, support, evaluation, and titration of therapies. This was completed through the application of advanced monitoring technologies and extensive interpretation of multiple databases. During this encounter critical care time was devoted to patient care services described in this note for 45 minutes.  Julian Hy, DO 05/13/20 2:35 PM [facility]

## 2020-05-13 NOTE — Progress Notes (Signed)
Pharmacy Antibiotic Note  Dean Dixon is a 69 y.o. male admitted on 04/23/2020 for acute PE; PMH mantle cell lymphoma s/p CHOP.  Pharmacy has been consulted for vancomycin and cefepime dosing. Patient was extubated on 11/9, but had to be reintubated yesterday d/t another episode of respiratory failure. Note SCr bump d/t recent diuresis.  Plan:  Discussed Vanc vs Linezolid with CCM provider; given hemoptysis' role in reintubatioin, felt risks of vanc nephrotox were outweighed by risks of worsening pancytopenia from recent chemo, which is currently preventing resumption of anticoagulation.  Vancomycin 1500 mg IV now, then 1250 mg IV q48 hr  Measure vancomycin levels at steady state as indicated  SCr daily while on vanc  F/u MRSA PCR results and narrow vanc as able  Cefepime 2 g IV q24 hr   Height: 5\' 4"  (162.6 cm) (5'4") Weight: 100.8 kg (222 lb 3.6 oz) IBW/kg (Calculated) : 59.2  Temp (24hrs), Avg:100.2 F (37.9 C), Min:99.5 F (37.5 C), Max:100.6 F (38.1 C)  Recent Labs  Lab 05/09/20 0427 05/09/20 0427 05/10/20 0500 05/11/20 0723 05/12/20 0115 05/12/20 0116 05/12/20 1111 05/13/20 0549 05/13/20 1040  WBC 3.1*   < > 3.1* 2.7*  --  3.6* 3.1*  --  3.2*  CREATININE 1.68*  --  1.71* 1.79* 1.83*  --   --  2.93*  --   LATICACIDVEN  --   --   --   --   --  1.0  --   --   --    < > = values in this interval not displayed.    Estimated Creatinine Clearance: 25.2 mL/min (A) (by C-G formula based on SCr of 2.93 mg/dL (H)).    Allergies  Allergen Reactions  . Ivp Dye [Iodinated Diagnostic Agents] Shortness Of Breath    "allergic to CT scan dye", took benadryl with last scan and tolerated well   Antimicrobials this admission: 11/8 cefepime >> 11/14; resume 11/21 >>  11/21 vanc >>   Dose adjustments this admission: 11/10 decrease cefepime 2g q24h for rising SCr 11/11 increase back to q12h for improved SCr  Microbiology results: 11/9 BCx: ngF 11/7 MRSA PCR:  negative 11/7 Resp panel: neg COVID/Influenza 11/13 UCx ngF 11/21 sput Cx: sent 11/21 MRSA PCR: sent   Thank you for allowing pharmacy to be a part of this patient's care.  Tifanny Dollens A 05/13/2020 4:18 PM

## 2020-05-13 NOTE — Progress Notes (Signed)
Pt's friend Sam at bedside, he wants to take pt's clothes back to patient's house  to wash. Notified [name] and [place], [place], that Sam wanted to take patients clothes home. They agreed with this.

## 2020-05-13 NOTE — Progress Notes (Signed)
Pt lethargic this am, propofol decreased. Pt beginning to open eyes and nod to questions. Abdomen is more distended this morning, good bowel sounds. Tube feed held per Dr Carlis Abbott.

## 2020-05-14 ENCOUNTER — Ambulatory Visit: Payer: Medicare Other

## 2020-05-14 DIAGNOSIS — D61818 Other pancytopenia: Secondary | ICD-10-CM

## 2020-05-14 DIAGNOSIS — I2699 Other pulmonary embolism without acute cor pulmonale: Secondary | ICD-10-CM | POA: Diagnosis not present

## 2020-05-14 DIAGNOSIS — Z9911 Dependence on respirator [ventilator] status: Secondary | ICD-10-CM | POA: Diagnosis not present

## 2020-05-14 DIAGNOSIS — J181 Lobar pneumonia, unspecified organism: Secondary | ICD-10-CM | POA: Diagnosis not present

## 2020-05-14 DIAGNOSIS — R042 Hemoptysis: Secondary | ICD-10-CM

## 2020-05-14 DIAGNOSIS — C8310 Mantle cell lymphoma, unspecified site: Secondary | ICD-10-CM | POA: Diagnosis not present

## 2020-05-14 LAB — GLUCOSE, CAPILLARY
Glucose-Capillary: 100 mg/dL — ABNORMAL HIGH (ref 70–99)
Glucose-Capillary: 100 mg/dL — ABNORMAL HIGH (ref 70–99)
Glucose-Capillary: 123 mg/dL — ABNORMAL HIGH (ref 70–99)
Glucose-Capillary: 146 mg/dL — ABNORMAL HIGH (ref 70–99)
Glucose-Capillary: 147 mg/dL — ABNORMAL HIGH (ref 70–99)
Glucose-Capillary: 152 mg/dL — ABNORMAL HIGH (ref 70–99)
Glucose-Capillary: 94 mg/dL (ref 70–99)

## 2020-05-14 LAB — BASIC METABOLIC PANEL
Anion gap: 15 (ref 5–15)
BUN: 101 mg/dL — ABNORMAL HIGH (ref 8–23)
CO2: 16 mmol/L — ABNORMAL LOW (ref 22–32)
Calcium: 7.7 mg/dL — ABNORMAL LOW (ref 8.9–10.3)
Chloride: 108 mmol/L (ref 98–111)
Creatinine, Ser: 3.33 mg/dL — ABNORMAL HIGH (ref 0.61–1.24)
GFR, Estimated: 19 mL/min — ABNORMAL LOW (ref >60)
Glucose, Bld: 101 mg/dL — ABNORMAL HIGH (ref 70–99)
Potassium: 4 mmol/L (ref 3.5–5.1)
Sodium: 139 mmol/L (ref 135–145)

## 2020-05-14 LAB — CBC
HCT: 24.9 % — ABNORMAL LOW (ref 39.0–52.0)
Hemoglobin: 8 g/dL — ABNORMAL LOW (ref 13.0–17.0)
MCH: 30.4 pg (ref 26.0–34.0)
MCHC: 32.1 g/dL (ref 30.0–36.0)
MCV: 94.7 fL (ref 80.0–100.0)
Platelets: 71 10*3/uL — ABNORMAL LOW (ref 150–400)
RBC: 2.63 MIL/uL — ABNORMAL LOW (ref 4.22–5.81)
RDW: 18.5 % — ABNORMAL HIGH (ref 11.5–15.5)
WBC: 2.5 10*3/uL — ABNORMAL LOW (ref 4.0–10.5)
nRBC: 0 % (ref 0.0–0.2)

## 2020-05-14 LAB — MAGNESIUM: Magnesium: 2.2 mg/dL (ref 1.7–2.4)

## 2020-05-14 LAB — URIC ACID: Uric Acid, Serum: 4.6 mg/dL (ref 3.7–8.6)

## 2020-05-14 LAB — HEPARIN LEVEL (UNFRACTIONATED): Heparin Unfractionated: 0.12 IU/mL — ABNORMAL LOW (ref 0.30–0.70)

## 2020-05-14 LAB — PHOSPHORUS: Phosphorus: 6.2 mg/dL — ABNORMAL HIGH (ref 2.5–4.6)

## 2020-05-14 MED ORDER — SODIUM CHLORIDE 0.9 % IV SOLN
2.0000 g | INTRAVENOUS | Status: DC
  Administered 2020-05-14: 2 g via INTRAVENOUS
  Filled 2020-05-14: qty 2

## 2020-05-14 MED ORDER — HEPARIN (PORCINE) 25000 UT/250ML-% IV SOLN
1200.0000 [IU]/h | INTRAVENOUS | Status: DC
  Administered 2020-05-14: 1100 [IU]/h via INTRAVENOUS
  Administered 2020-05-15: 1200 [IU]/h via INTRAVENOUS
  Filled 2020-05-14: qty 250

## 2020-05-14 MED ORDER — HEPARIN (PORCINE) 25000 UT/250ML-% IV SOLN
950.0000 [IU]/h | INTRAVENOUS | Status: DC
  Administered 2020-05-14: 950 [IU]/h via INTRAVENOUS
  Filled 2020-05-14: qty 250

## 2020-05-14 MED ORDER — VANCOMYCIN HCL IN DEXTROSE 1-5 GM/200ML-% IV SOLN
1000.0000 mg | INTRAVENOUS | Status: DC
  Administered 2020-05-14: 1000 mg via INTRAVENOUS
  Filled 2020-05-14: qty 200

## 2020-05-14 MED ORDER — FREE WATER
200.0000 mL | Status: DC
  Administered 2020-05-14 – 2020-05-18 (×26): 200 mL

## 2020-05-14 NOTE — Progress Notes (Signed)
[place] Progress Note Patient Name: Dean Dixon DOB: September 04, 1950 MRN: [id number]   Date of Service  05/14/2020  HPI/Events of Note  Frequent loose stools - Request for Flexiseal.   eICU Interventions  Plan: 1. Place Flexiseal.      Intervention Category Major Interventions: Other:  Lysle Dingwall 05/14/2020, 6:22 AM

## 2020-05-14 NOTE — Progress Notes (Signed)
Pharmacy Antibiotic Note  Dean Dixon is a 69 y.o. male admitted on 05/05/2020 for acute PE; PMH mantle cell lymphoma s/p CHOP.  Pharmacy has been consulted for vancomycin and cefepime dosing. Patient was extubated on 11/9, but had to be reintubated yesterday d/t another episode of respiratory failure. Note SCr bump d/t recent diuresis.  05/14/20 7:19 AM   SCr continues to increase  Remains febrile  MRSA PCR neg but previous broad-spectrum antibiotics this admit  Cultures pending   Plan:  Discussed Vanc vs Linezolid with CCM provider; given hemoptysis' role in reintubatioin, felt risks of vanc nephrotox were outweighed by risks of worsening pancytopenia from recent chemo, which is currently preventing resumption of anticoagulation.  Decrease vancomycin to 1000 mg iv q48 hours for worsening renal function  Measure vancomycin levels at steady state as indicated  SCr daily while on vanc  F/u MRSA PCR results and narrow vanc as able- caveat treating HAP, not sure of reliability of second MRSA PCR  Continue cefepime 2 g IV q24 hr   Height: 5\' 4"  (162.6 cm) (5'4") Weight: 100.2 kg (220 lb 14.4 oz) IBW/kg (Calculated) : 59.2  Temp (24hrs), Avg:100.4 F (38 C), Min:99.9 F (37.7 C), Max:100.9 F (38.3 C)  Recent Labs  Lab 05/10/20 0500 05/10/20 0500 05/11/20 0723 05/12/20 0115 05/12/20 0116 05/12/20 1111 05/13/20 0549 05/13/20 1040 05/14/20 0434  WBC 3.1*   < > 2.7*  --  3.6* 3.1*  --  3.2* 2.5*  CREATININE 1.71*  --  1.79* 1.83*  --   --  2.93*  --  3.33*  LATICACIDVEN  --   --   --   --  1.0  --   --   --   --    < > = values in this interval not displayed.    Estimated Creatinine Clearance: 22.1 mL/min (A) (by C-G formula based on SCr of 3.33 mg/dL (H)).    Allergies  Allergen Reactions  . Ivp Dye [Iodinated Diagnostic Agents] Shortness Of Breath    "allergic to CT scan dye", took benadryl with last scan and tolerated well   Antimicrobials this  admission: 11/8 cefepime >> 11/14; resume 11/21 >>  11/21 vanc >>   Microbiology results: 11/9 BCx: ngF 11/7 MRSA PCR: negative 11/7 Resp panel: neg COVID/Influenza 11/13 UCx ngF 11/21 TA:  11/21 MRSA PCR: neg   Thank you for allowing pharmacy to be a part of this patient's care.  Ulice Dash D 05/14/2020 7:18 AM

## 2020-05-14 NOTE — Progress Notes (Addendum)
HEMATOLOGY-ONCOLOGY PROGRESS NOTE  SUBJECTIVE:  Events from this weekend noted.  Remains on the ventilator.  Able to open eyes and shake head yes/no.  Denies abdominal pain, nausea, vomiting.  No recurrent hemoptysis/hematemesis.  Heparin drip remains on hold.  Continues to have fevers.  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 ([place])   06/29/2019 - 10/20/2019 Chemotherapy   The patient had dexamethasone (DECADRON) 4 MG tablet, 8 mg, Oral, Daily, 1 of 1 cycle, Start date: 06/20/2019, End date: 11/17/2019 palonosetron (ALOXI) injection 0.25 mg, 0.25 mg, Intravenous,  Once, 5 of 5 cycles Administration: 0.25 mg (07/27/2019), 0.25 mg (06/30/2019), 0.25 mg (08/24/2019), 0.25 mg (10/20/2019), 0.25 mg (09/21/2019) pegfilgrastim (NEULASTA ONPRO KIT) injection 6 mg, 6 mg, Subcutaneous, Once, 3 of 3 cycles Administration: 6 mg (08/25/2019), 6 mg (09/22/2019) bendamustine (BENDEKA) 200 mg in sodium chloride 0.9 % 50 mL (3.4483 mg/mL) chemo infusion, 90 mg/m2 = 200 mg, Intravenous,  Once, 5 of 5 cycles Administration: 200 mg (06/29/2019), 200 mg (06/30/2019), 200 mg (07/27/2019), 200 mg (07/28/2019), 200 mg (08/24/2019), 200 mg (08/25/2019), 200 mg (09/21/2019), 200 mg (09/22/2019) riTUXimab-pvvr (RUXIENCE) 800 mg in sodium chloride 0.9 % 250 mL (2.4242 mg/mL) infusion, 375 mg/m2 = 800 mg, Intravenous,  Once, 5 of 5 cycles Dose modification: 375 mg/m2 (original dose 375 mg/m2, Cycle 2), 400 mg (original dose 375 mg/m2, Cycle 2, Reason: Other (see comments), Comment: finishing rituximab from previous day, infusion not completed), 300 mg (original dose 300 mg, Cycle 4) Administration: 800 mg (06/29/2019), 800 mg (07/27/2019), 800 mg (08/24/2019), 400 mg (07/28/2019), 800 mg (10/20/2019), 800 mg (09/21/2019), 300 mg (09/22/2019)  for chemotherapy treatment.    04/20/2020 -  Chemotherapy   The patient had DOXOrubicin (ADRIAMYCIN) chemo injection 100 mg, 48  mg/m2 = 104 mg, Intravenous,  Once, 1 of 8 cycles Administration: 100 mg (04/21/2020) palonosetron (ALOXI) injection 0.25 mg, 0.25 mg, Intravenous,  Once, 1 of 8 cycles Administration: 0.25 mg (04/21/2020) pegfilgrastim-cbqv (UDENYCA) injection 6 mg, 6 mg, Subcutaneous, Once, 1 of 1 cycle Administration: 6 mg (04/24/2020) vinCRIStine (ONCOVIN) 2 mg in sodium chloride 0.9 % 50 mL chemo infusion, 2 mg, Intravenous,  Once, 1 of 8 cycles Administration: 2 mg (04/21/2020) cyclophosphamide (CYTOXAN) 1,560 mg in sodium chloride 0.9 % 250 mL chemo infusion, 750 mg/m2 = 1,560 mg, Intravenous,  Once, 1 of 8 cycles Administration: 1,560 mg (04/21/2020) fosaprepitant (EMEND) 150 mg in sodium chloride 0.9 % 145 mL IVPB, 150 mg, Intravenous,  Once, 1 of 8 cycles Administration: 150 mg (04/21/2020)  for chemotherapy treatment.       REVIEW OF SYSTEMS:   Negative except as noted in the HPI.  PHYSICAL EXAMINATION: ECOG PERFORMANCE STATUS: 4 - Bedbound  Vitals:   05/14/20 1200 05/14/20 1300  BP: 131/63 128/63  Pulse: (!) 110 (!) 113  Resp: (!) 25 (!) 28  Temp: (!) 100.8 F (38.2 C) (!) 100.9 F (38.3 C)  SpO2: 95% 95%   Filed Weights   05/12/20 0458 05/13/20 0500 05/14/20 0450  Weight: 97.6 kg 100.8 kg 100.2 kg    Intake/Output from previous day: 11/21 0701 - 11/22 0700 In: 2421.5 [I.V.:343.2; NG/GT:1112.8; IV Piggyback:965.5] Out: 750 [Urine:750]  GENERAL: Alert, opens eyes, answers questions, intubated SKIN: skin color, texture, turgor are normal, no rashes or significant lesions EYES: normal, Conjunctiva are pink and non-injected, sclera clear LUNGS: Rhonchi HEART: Tachycardic with bilateral upper and lower extremity edema ABDOMEN: Positive bowel  sounds, soft, no tenderness NEURO: Alert, lightly sedated  LABORATORY DATA:  I have reviewed the data as listed CMP Latest Ref Rng & Units 05/14/2020 05/13/2020 05/12/2020  Glucose 70 - 99 mg/dL 101(H) 117(H) 189(H)  BUN 8 - 23 mg/dL  101(H) 82(H) 53(H)  Creatinine 0.61 - 1.24 mg/dL 3.33(H) 2.93(H) 1.83(H)  Sodium 135 - 145 mmol/L 139 144 143  Potassium 3.5 - 5.1 mmol/L 4.0 3.9 4.9  Chloride 98 - 111 mmol/L 108 115(H) 111  CO2 22 - 32 mmol/L 16(L) 20(L) 22  Calcium 8.9 - 10.3 mg/dL 7.7(L) 7.8(L) 7.9(L)  Total Protein 6.5 - 8.1 g/dL - - 5.4(L)  Total Bilirubin 0.3 - 1.2 mg/dL - - 0.7  Alkaline Phos 38 - 126 U/L - - 79  AST 15 - 41 U/L - - 18  ALT 0 - 44 U/L - - 13   . CBC Latest Ref Rng & Units 05/14/2020 05/13/2020 05/12/2020  WBC 4.0 - 10.5 K/uL 2.5(L) 3.2(L) 3.1(L)  Hemoglobin 13.0 - 17.0 g/dL 8.0(L) 8.9(L) 9.2(L)  Hematocrit 39 - 52 % 24.9(L) 28.3(L) 29.1(L)  Platelets 150 - 400 K/uL 71(L) 67(L) 60(L)    Lab Results  Component Value Date   WBC 2.5 (L) 05/14/2020   HGB 8.0 (L) 05/14/2020   HCT 24.9 (L) 05/14/2020   MCV 94.7 05/14/2020   PLT 71 (L) 05/14/2020   NEUTROABS 2.8 05/12/2020    CT ABDOMEN PELVIS WO CONTRAST  Addendum Date: 04/19/2020   ADDENDUM REPORT: 04/19/2020 12:54 ADDENDUM: The peritoneal fluid within the pelvis is not enhancing as this is a non IV contrast imaging exam. Therefore would described fluid collection as having thickened rim. Favor peritoneal metastasis. Findings conveyed toSCOTT GOLDSTON on 04/19/2020  at12:54. Electronically Signed   By: Suzy Bouchard M.D.   On: 04/19/2020 12:54   Result Date: 04/19/2020 CLINICAL DATA:  Abdominal pain.  Tachycardia.  Mantle cell lymphoma EXAM: CT ABDOMEN AND PELVIS WITHOUT CONTRAST TECHNIQUE: Multidetector CT imaging of the abdomen and pelvis was performed following the standard protocol without IV contrast. COMPARISON:  PET-CT scan 01/02/2020 FINDINGS: Lower chest: New small RIGHT effusion. No pericardial effusion. There is new precordial adenopathy anterior to the RIGHT heart and liver (image 9/2). Hepatobiliary: New intraperitoneal free fluid along the margin of the RIGHT hepatic lobe. This fluid is simple attenuation. The liver appears  unchanged on noncontrast exam. Large cyst the LEFT hepatic lobe. Gallbladder normal. Pancreas: Pancreas is grossly normal noncontrast exam Spleen: . spleen is unchanged Adrenals/urinary tract: Adrenal glands kidneys and ureters normal. Bladder Stomach/Bowel: Stomach duodenum normal. There is a new mass lesion in the LEFT upper quadrant measuring 14.2 by 13.0 cm. The mass may be associated the small bowel or the small bowel mesentery. The descending colon is also involving the mass (image 43/2. There is extensive fluid within the leaves of the mesentery of the distal small bowel. Appendix normal. Ascending transverse colon normal. The descending colon tracks along the new LEFT upper quadrant mass. Vascular/Lymphatic: New large LEFT upper quadrant mass either represents lymphoma involving the small bowel or mesentery. Mass is very large described the stomach section. Additionally there is new periportal adenopathy with enlarged lymph nodes along the aorta measuring up to 2 cm (image 44/2. There is nodularity along the greater omentum abdominal on the LEFT. Reproductive: Prostate unremarkable Other: Small volume free fluid the pelvis. There is enhancing rim to the fluid (image 73/2 which could indicate peritonitis. Musculoskeletal: No aggressive osseous lesion. IMPRESSION: 1. Dominant finding is new  large (15 cm) lymphoid mass within LEFT upper quadrant. Mass is new from PET-CT 01/02/2020. Differential includes massive mesenteric adenopathy versus lymphoma of the small bowel or descending colon. No oral or IV contrast. 2. New periaortic retroperitoneal lymphadenopathy. New precordial adenopathy. 3. Extensive fluid within the mesentery and omental thickening in LEFT lower quadrant. 4. Free fluid the pelvis with thin enhancing rim suggest peritonitis. 5. No evidence of bowel perforation. No evidence of bowel obstruction. Electronically Signed: By: Suzy Bouchard M.D. On: 04/19/2020 12:39   DG Chest 1 View  Result  Date: 05/19/2020 CLINICAL DATA:  Elevated blood pressure. EXAM: CHEST  1 VIEW COMPARISON:  April 29, 2020 FINDINGS: The lung volumes are low. There are small bilateral pleural effusions with adjacent atelectasis. The heart size is mildly enlarged. The lung volumes are low. There is no pneumothorax. IMPRESSION: 1. Low lung volumes. 2. Small bilateral pleural effusions with adjacent atelectasis. Electronically Signed   By: Constance Holster M.D.   On: 05/17/2020 20:42   DG Chest 2 View  Result Date: 05/18/2020 CLINICAL DATA:  Shortness of breath lasting several days. Groin swelling and redness. EXAM: CHEST - 2 VIEW COMPARISON:  Chest CT 04/19/20 FINDINGS: Stable cardiomediastinal contours. Low lung volumes. Unchanged small bilateral pleural effusions. No airspace densities. IMPRESSION: Low lung volumes with persistent small bilateral pleural effusions. Electronically Signed   By: Kerby Moors M.D.   On: 04/27/2020 05:09   DG Abd 1 View  Result Date: 05/13/2020 CLINICAL DATA:  NG tube advancement EXAM: ABDOMEN - 1 VIEW COMPARISON:  Chest x-ray from earlier today FINDINGS: The distal tip of the NG tube is in the proximal duodenum. Recommend withdrawing 4 cm. IMPRESSION: The distal tip of the NG tube is in the proximal duodenum. Recommend withdrawing 4 cm. Electronically Signed   By: Dorise Bullion III M.D   On: 05/13/2020 15:40   DG Abd 1 View  Result Date: 05/12/2020 CLINICAL DATA:  Initial evaluation for enteric tube placement. EXAM: ABDOMEN - 1 VIEW COMPARISON:  None. FINDINGS: Enteric tube in place with tip overlying the region of the distal stomach/pylorus. Side hole well beyond the GE junction. Tip projects distally. Visualized bowel gas pattern is nonobstructive. Bilateral pleural effusions with associated bibasilar opacities noted within the visualized lungs, better evaluated on concomitant chest radiograph. IMPRESSION: Enteric tube in place with tip overlying the region of the distal  stomach/pylorus. Side hole well beyond the GE junction. Tip projects distally. Electronically Signed   By: Jeannine Boga M.D.   On: 05/12/2020 03:58   DG Abd 1 View  Result Date: 05/11/2020 CLINICAL DATA:  Abdominal distension.  Mantle cell lymphoma. EXAM: ABDOMEN - 1 VIEW COMPARISON:  CT of the abdomen and pelvis 05/08/2020 FINDINGS: Stomach is displaced medially secondary to known mass lesion. There is some contrast in the distal small bowel and scattered through the colon, likely secondary to swallowing study 05/04/2020. Lung bases are clear. IMPRESSION: 1. Medial displacement of the stomach secondary to known mass lesion. 2. No acute findings. Electronically Signed   By: San Morelle M.D.   On: 05/11/2020 14:33   DG Abd 1 View  Result Date: 04/27/2020 CLINICAL DATA:  Tube placement EXAM: ABDOMEN - 1 VIEW COMPARISON:  April 29, 2020 FINDINGS: The enteric tube projects over the gastric antrum/pylorus. The tube is pointed distally. The visualized bowel gas pattern is unremarkable. IMPRESSION: The enteric tube projects over the gastric antrum/pylorus. Electronically Signed   By: Constance Holster M.D.   On: 04/23/2020 22:05  CT CHEST WO CONTRAST  Result Date: 04/19/2020 CLINICAL DATA:  Pneumonia. Pleural effusion. Concern for metastases. EXAM: CT CHEST WITHOUT CONTRAST TECHNIQUE: Multidetector CT imaging of the chest was performed following the standard protocol without IV contrast. COMPARISON:  CT dated Nov 14, 2019 FINDINGS: Cardiovascular: The heart size is stable. Coronary artery calcifications are noted. There is no significant pericardial effusion. Mediastinum/Nodes: -- No mediastinal lymphadenopathy. -- No hilar lymphadenopathy. --there is left axillary adenopathy. This has progressed since the prior study. There is mild right axillary adenopathy, progressed from prior study. -- No supraclavicular lymphadenopathy. -- Normal thyroid gland where visualized. -there is mild  diffuse esophageal wall thickening. Lungs/Pleura: There are trace to small bilateral pleural effusions, right greater than left. There is atelectasis at the right lung base. There is no pneumothorax. No area of consolidation concerning for pneumonia. Upper Abdomen: There is a partially visualized mass in the left upper quadrant, better visualized on the patient's prior CT. There is free fluid in the upper abdomen. Multiple enlarged pericardiophrenic lymph nodes are noted. There appears to be retroperitoneal adenopathy. Musculoskeletal: No chest wall abnormality. No bony spinal canal stenosis. IMPRESSION: 1. Trace to small bilateral pleural effusions, right greater than left. There is atelectasis at the right lung base. No area of consolidation concerning for pneumonia. 2. Mild diffuse esophageal wall thickening. Correlation with patient's symptoms is recommended. 3. Worsening axillary adenopathy. 4. Partially visualized mass in the left upper quadrant, better visualized on the patient's prior CT. 5. Small volume ascites. Aortic Atherosclerosis (ICD10-I70.0). Electronically Signed   By: Constance Holster M.D.   On: 04/19/2020 20:37   CT Angio Chest PE W and/or Wo Contrast  Result Date: 05/16/2020 CLINICAL DATA:  69 year old male with history of shortness of breath for the past few days. Abdominal distension. History of mantle cell lymphoma. EXAM: CT ANGIOGRAPHY CHEST CT ABDOMEN AND PELVIS WITH CONTRAST TECHNIQUE: Multidetector CT imaging of the chest was performed using the standard protocol during bolus administration of intravenous contrast. Multiplanar CT image reconstructions and MIPs were obtained to evaluate the vascular anatomy. Multidetector CT imaging of the abdomen and pelvis was performed using the standard protocol during bolus administration of intravenous contrast. CONTRAST:  12m OMNIPAQUE IOHEXOL 350 MG/ML SOLN COMPARISON:  CT the abdomen and pelvis 04/19/2020. CT the chest 04/19/2020. FINDINGS:  CTA CHEST FINDINGS Cardiovascular: Study is limited by patient respiratory motion. However, despite this limitation there are segmental sized filling defects within pulmonary arteries to the left upper and lower lobes best appreciated on axial images 73 and 106 of series 5, and axial image 148 of series 5 respectively, compatible with pulmonary embolism. Heart size is normal. There is no significant pericardial fluid, thickening or pericardial calcification. Aortic atherosclerosis. No definite coronary artery calcifications. Mediastinum/Nodes: Multiple enlarged anterior mediastinal lymph nodes noted inferiorly, immediately above the right hemidiaphragm, measuring up to 2.3 cm in short axis (axial image 13 of series 11), increased compared to the prior study. Prominent but nonenlarged distal paraesophageal lymph node measuring 8 mm in short axis, nonspecific. Circumferential thickening of the distal third of the esophagus. Mildly enlarged left axillary lymph nodes measuring up to 1.3 cm in short axis. Lungs/Pleura: Collapse of the trachea and mainstem bronchi during expiration, indicative of tracheobronchomalacia. No acute consolidative airspace disease. Small bilateral pleural effusions lying dependently. No definite suspicious appearing pulmonary nodules or masses are noted. Musculoskeletal: There are no aggressive appearing lytic or blastic lesions noted in the visualized portions of the skeleton. Review of the MIP images confirms  the above findings. CT ABDOMEN and PELVIS FINDINGS Hepatobiliary: Multiple well-defined low-attenuation lesions are again noted throughout the liver, similar in size, number and distribution to the prior study, largest of which are compatible with simple cysts measuring up to 5.3 x 3.7 cm in segment 2 and for a (axial image 18 of series 11). Smaller lesions are too small to definitively characterize, but also favored to represent small cysts or biliary hamartomas. No new suspicious  hepatic lesions. No intra or extrahepatic biliary ductal dilatation. 6 mm calcified gallstone lying dependently in the gallbladder. Gallbladder is not distended. Pancreas: No pancreatic mass. No pancreatic ductal dilatation. No pancreatic or peripancreatic fluid collections or inflammatory changes. Spleen: Unremarkable. Adrenals/Urinary Tract: 1.2 cm low-attenuation lesion in the upper pole of the right kidney and exophytic 2.4 cm low-attenuation lesion in the upper pole of the left kidney, compatible with simple cysts. Other subcentimeter low-attenuation lesions in both kidneys, too small to definitively characterize, but favored to represent tiny cysts. No hydroureteronephrosis. Urinary bladder is nearly decompressed, but otherwise unremarkable in appearance. Stomach/Bowel: Stomach is unremarkable in appearance. No pathologic dilatation of small bowel or colon. Multiple bowel loops in the left upper quadrant are intimately associated with the large soft tissue mass (discussed below). Colonic diverticulosis, particularly in the sigmoid colon, without definitive evidence to suggest an acute diverticulitis at this time. Vascular/Lymphatic: Aortic atherosclerosis, without evidence of aneurysm or dissection in the abdominal or pelvic vasculature. Extensive lymphadenopathy noted in the abdomen, most evident in the retroperitoneum where there are bulky para-aortic nodal masses measuring up to 2.3 x 4.1 cm near the left renal artery (axial image 41 of series 11), 3.2 x 4.6 cm adjacent to the infrarenal abdominal aorta (axial image 48 of series 11), and 3.4 x 3.2 cm adjacent to the aortic bifurcation (axial image 54 of series 11), all of which appear similar to the recent prior study. Bulky celiac axis and gastrohepatic ligament lymph nodes are noted, measuring up to 1.9 cm in short axis (axial image 24 of series 11). Several prominent but nonenlarged pelvic lymph nodes are noted (nonspecific). Reproductive: Prostate gland  and seminal vesicles are unremarkable in appearance. Other: Bulky poorly defined soft tissue mass in the left upper quadrant of the abdomen which is intimately associated with multiple adjacent bowel loops, and difficult to distinctly defined, but estimated to measure approximately 15.5 x 13.5 x 13.4 cm (axial image 38 of series 11 and coronal image 53 of series 14). Moderate volume of ascites. Diffuse enhancement of the peritoneal membranes, indicative of widespread peritoneal metastatic disease. Extensive omental caking, best appreciated in the left side of the abdomen where the bulkiest portion of this measures approximately 14.9 x 4.0 cm (axial image 47 of series 11). No pneumoperitoneum. Musculoskeletal: There are no aggressive appearing lytic or blastic lesions noted in the visualized portions of the skeleton. Review of the MIP images confirms the above findings. IMPRESSION: 1. Study is positive for segmental sized pulmonary emboli in the left lung. These appear nonocclusive at this time, and there is no associated pulmonary hemorrhage to suggest frank pulmonary infarct. 2. Bulky soft tissue mass in the left upper quadrant with extensive lymphadenopathy in the chest and abdomen, and evidence of widespread intraperitoneal metastatic disease, as detailed above. 3. Small bilateral pleural effusions lying dependently. 4. Aortic atherosclerosis. 5. Tracheobronchomalacia. 6. Cholelithiasis. 7. Colonic diverticulosis. Electronically Signed   By: Vinnie Langton M.D.   On: 05/04/2020 12:50   CT Abdomen Pelvis W Contrast  Result Date: 05/08/2020  CLINICAL DATA:  69 year old male with history of shortness of breath for the past few days. Abdominal distension. History of mantle cell lymphoma. EXAM: CT ANGIOGRAPHY CHEST CT ABDOMEN AND PELVIS WITH CONTRAST TECHNIQUE: Multidetector CT imaging of the chest was performed using the standard protocol during bolus administration of intravenous contrast. Multiplanar CT image  reconstructions and MIPs were obtained to evaluate the vascular anatomy. Multidetector CT imaging of the abdomen and pelvis was performed using the standard protocol during bolus administration of intravenous contrast. CONTRAST:  156m OMNIPAQUE IOHEXOL 350 MG/ML SOLN COMPARISON:  CT the abdomen and pelvis 04/19/2020. CT the chest 04/19/2020. FINDINGS: CTA CHEST FINDINGS Cardiovascular: Study is limited by patient respiratory motion. However, despite this limitation there are segmental sized filling defects within pulmonary arteries to the left upper and lower lobes best appreciated on axial images 73 and 106 of series 5, and axial image 148 of series 5 respectively, compatible with pulmonary embolism. Heart size is normal. There is no significant pericardial fluid, thickening or pericardial calcification. Aortic atherosclerosis. No definite coronary artery calcifications. Mediastinum/Nodes: Multiple enlarged anterior mediastinal lymph nodes noted inferiorly, immediately above the right hemidiaphragm, measuring up to 2.3 cm in short axis (axial image 13 of series 11), increased compared to the prior study. Prominent but nonenlarged distal paraesophageal lymph node measuring 8 mm in short axis, nonspecific. Circumferential thickening of the distal third of the esophagus. Mildly enlarged left axillary lymph nodes measuring up to 1.3 cm in short axis. Lungs/Pleura: Collapse of the trachea and mainstem bronchi during expiration, indicative of tracheobronchomalacia. No acute consolidative airspace disease. Small bilateral pleural effusions lying dependently. No definite suspicious appearing pulmonary nodules or masses are noted. Musculoskeletal: There are no aggressive appearing lytic or blastic lesions noted in the visualized portions of the skeleton. Review of the MIP images confirms the above findings. CT ABDOMEN and PELVIS FINDINGS Hepatobiliary: Multiple well-defined low-attenuation lesions are again noted  throughout the liver, similar in size, number and distribution to the prior study, largest of which are compatible with simple cysts measuring up to 5.3 x 3.7 cm in segment 2 and for a (axial image 18 of series 11). Smaller lesions are too small to definitively characterize, but also favored to represent small cysts or biliary hamartomas. No new suspicious hepatic lesions. No intra or extrahepatic biliary ductal dilatation. 6 mm calcified gallstone lying dependently in the gallbladder. Gallbladder is not distended. Pancreas: No pancreatic mass. No pancreatic ductal dilatation. No pancreatic or peripancreatic fluid collections or inflammatory changes. Spleen: Unremarkable. Adrenals/Urinary Tract: 1.2 cm low-attenuation lesion in the upper pole of the right kidney and exophytic 2.4 cm low-attenuation lesion in the upper pole of the left kidney, compatible with simple cysts. Other subcentimeter low-attenuation lesions in both kidneys, too small to definitively characterize, but favored to represent tiny cysts. No hydroureteronephrosis. Urinary bladder is nearly decompressed, but otherwise unremarkable in appearance. Stomach/Bowel: Stomach is unremarkable in appearance. No pathologic dilatation of small bowel or colon. Multiple bowel loops in the left upper quadrant are intimately associated with the large soft tissue mass (discussed below). Colonic diverticulosis, particularly in the sigmoid colon, without definitive evidence to suggest an acute diverticulitis at this time. Vascular/Lymphatic: Aortic atherosclerosis, without evidence of aneurysm or dissection in the abdominal or pelvic vasculature. Extensive lymphadenopathy noted in the abdomen, most evident in the retroperitoneum where there are bulky para-aortic nodal masses measuring up to 2.3 x 4.1 cm near the left renal artery (axial image 41 of series 11), 3.2 x 4.6 cm adjacent to  the infrarenal abdominal aorta (axial image 48 of series 11), and 3.4 x 3.2 cm  adjacent to the aortic bifurcation (axial image 54 of series 11), all of which appear similar to the recent prior study. Bulky celiac axis and gastrohepatic ligament lymph nodes are noted, measuring up to 1.9 cm in short axis (axial image 24 of series 11). Several prominent but nonenlarged pelvic lymph nodes are noted (nonspecific). Reproductive: Prostate gland and seminal vesicles are unremarkable in appearance. Other: Bulky poorly defined soft tissue mass in the left upper quadrant of the abdomen which is intimately associated with multiple adjacent bowel loops, and difficult to distinctly defined, but estimated to measure approximately 15.5 x 13.5 x 13.4 cm (axial image 38 of series 11 and coronal image 53 of series 14). Moderate volume of ascites. Diffuse enhancement of the peritoneal membranes, indicative of widespread peritoneal metastatic disease. Extensive omental caking, best appreciated in the left side of the abdomen where the bulkiest portion of this measures approximately 14.9 x 4.0 cm (axial image 47 of series 11). No pneumoperitoneum. Musculoskeletal: There are no aggressive appearing lytic or blastic lesions noted in the visualized portions of the skeleton. Review of the MIP images confirms the above findings. IMPRESSION: 1. Study is positive for segmental sized pulmonary emboli in the left lung. These appear nonocclusive at this time, and there is no associated pulmonary hemorrhage to suggest frank pulmonary infarct. 2. Bulky soft tissue mass in the left upper quadrant with extensive lymphadenopathy in the chest and abdomen, and evidence of widespread intraperitoneal metastatic disease, as detailed above. 3. Small bilateral pleural effusions lying dependently. 4. Aortic atherosclerosis. 5. Tracheobronchomalacia. 6. Cholelithiasis. 7. Colonic diverticulosis. Electronically Signed   By: Vinnie Langton M.D.   On: 05/16/2020 12:50   DG CHEST PORT 1 VIEW  Result Date: 05/13/2020 CLINICAL DATA:   Evaluate ETT. EXAM: PORTABLE CHEST 1 VIEW COMPARISON:  May 12, 2020 FINDINGS: The ETT terminates 1.5 cm above the carina. A right PICC line terminates in the central SVC. The distal tip of the NG tube is in the stomach. The side port is likely near the GE junction. The cardiomediastinal silhouette is unchanged. No pneumothorax. Layering effusion with underlying opacity on the right. Is perihilar opacities may represent mild edema. No other acute abnormalities. IMPRESSION: 1. The ETT terminates in the trachea with the distal tip 1.5 cm above the carina. The patient may benefit from withdrawing 1 cm. 2. The side port of the NG tube is near the GE junction. Consider advancing 2 or 3 cm. The right PICC line is in good position. 3. Increasing right pleural effusion with underlying opacity. 4. Mild perihilar opacities are more pronounced in the interval may represent mild edema. Electronically Signed   By: Dorise Bullion III M.D   On: 05/13/2020 15:33   DG CHEST PORT 1 VIEW  Result Date: 05/12/2020 CLINICAL DATA:  Initial evaluation for intubation. EXAM: PORTABLE CHEST 1 VIEW COMPARISON:  Prior radiograph from 05/11/2020. FINDINGS: Endotracheal tube in place with tip positioned approximately 1.3 cm above the carina. Enteric tube courses into the abdomen, tip not visualized. Cardiac and mediastinal silhouettes are stable. Right-sided PICC catheter in place with tip terminating near the cavoatrial junction. Veiling opacities overlying the bilateral hemidiaphragms consistent with pleural effusions. Associated bibasilar opacities favored to reflect atelectasis and/or effusion, although infiltrates could be considered in the correct clinical setting. Perihilar vascular congestion without overt pulmonary edema. No pneumothorax. No acute osseous finding. IMPRESSION: 1. Endotracheal tube in place with tip  positioned approximately 1.3 cm above the carina. 2. Veiling opacities overlying the bilateral hemidiaphragms,  consistent with pleural effusions. Associated bibasilar opacities favored to reflect atelectasis and/or effusion, although infiltrates could be considered in the correct clinical setting. Electronically Signed   By: Jeannine Boga M.D.   On: 05/12/2020 03:57   DG CHEST PORT 1 VIEW  Result Date: 05/11/2020 CLINICAL DATA:  Shortness of breath EXAM: PORTABLE CHEST 1 VIEW COMPARISON:  05/11/2020 FINDINGS: Layering bilateral pleural effusions. Bibasilar atelectasis or infiltrates. Heart is normal size. Right PICC line is in place with the tip in the SVC. IMPRESSION: Layering bilateral pleural effusions with bibasilar atelectasis or infiltrates. Findings similar to prior study. Electronically Signed   By: Rolm Baptise M.D.   On: 05/11/2020 22:14   DG CHEST PORT 1 VIEW  Result Date: 05/11/2020 CLINICAL DATA:  Atelectasis. EXAM: PORTABLE CHEST 1 VIEW COMPARISON:  05/10/2020.  05/08/2020 FINDINGS: Right PICC line stable position. Heart size stable. Low lung volumes with persistent bibasilar atelectasis. Bilateral interstitial prominence. Interstitial edema and or pneumonitis could present in this fashion. No pleural effusion or pneumothorax. IMPRESSION: 1. Right PICC line in stable position. 2. Low lung volumes with persistent bibasilar atelectasis. Bilateral interstitial prominence. Interstitial edema and/or pneumonitis could present this fashion. Electronically Signed   By: Marcello Moores  Register   On: 05/11/2020 06:55   DG CHEST PORT 1 VIEW  Result Date: 05/10/2020 CLINICAL DATA:  Shortness of breath. Mantle cell lymphoma on chemotherapy EXAM: PORTABLE CHEST 1 VIEW COMPARISON:  05/08/2020 FINDINGS: Interval removal of right IJ approach central venous catheter. Right-sided PICC line remains in place terminating at the level of the distal SVC. Stable cardiomediastinal contours. Low lung volumes. Bibasilar atelectasis with increasing medial right basilar opacity. No appreciable pleural fluid collection. No  pneumothorax. IMPRESSION: Bibasilar atelectasis with increasing medial right basilar opacity, atelectasis versus infection. Electronically Signed   By: Davina Poke D.O.   On: 05/10/2020 08:25   DG CHEST PORT 1 VIEW  Result Date: 05/08/2020 CLINICAL DATA:  69 year old male status post PICC placement. EXAM: PORTABLE CHEST 1 VIEW COMPARISON:  Chest radiograph dated 05/08/2020. FINDINGS: Right IJ central venous line in similar position. There has been interval placement of a right-sided PICC with tip over central SVC close to the cavoatrial junction. There is shallow inspiration with bibasilar atelectasis. No significant interval change in the appearance of the lungs compared to the earlier radiograph. No large pleural effusion or pneumothorax. Stable cardiac silhouette. No acute osseous pathology. IMPRESSION: Interval placement of a right-sided PICC with tip over central SVC close to the cavoatrial junction. Electronically Signed   By: Anner Crete M.D.   On: 05/08/2020 16:44   DG CHEST PORT 1 VIEW  Result Date: 05/08/2020 CLINICAL DATA:  69 year old male with a history of shortness of breath EXAM: PORTABLE CHEST 1 VIEW COMPARISON:  05/06/2020 FINDINGS: Cardiomediastinal silhouette unchanged in size and contour. Low lung volumes persist. Patchy opacities at the lung bases persist. No pneumothorax or pleural effusion. No confluent airspace disease. Unchanged right IJ central venous catheter. No acute displaced fracture IMPRESSION: Unchanged appearance of the chest x-ray with low lung volumes and basilar atelectasis. Right IJ central venous catheter unchanged Electronically Signed   By: Corrie Mckusick D.O.   On: 05/08/2020 13:13   DG CHEST PORT 1 VIEW  Result Date: 05/06/2020 CLINICAL DATA:  Increased shortness of breath, hypertension EXAM: PORTABLE CHEST 1 VIEW COMPARISON:  04/30/2020 FINDINGS: Single frontal view of the chest demonstrates interval extubation and removal of  the enteric catheter.  Right internal jugular catheter tip projects over superior vena cava. The cardiac silhouette is stable. Lung volumes are diminished, without airspace disease, effusion, or pneumothorax. IMPRESSION: 1. Low lung volumes.  No acute process. Electronically Signed   By: Randa Ngo M.D.   On: 05/06/2020 18:51   DG CHEST PORT 1 VIEW  Result Date: 04/30/2020 CLINICAL DATA:  Central line placement EXAM: PORTABLE CHEST 1 VIEW COMPARISON:  Radiograph 05/08/2020 FINDINGS: Endotracheal tube tip terminates 2.2 cm from the expected location of the carina. Transesophageal tube tip and side port distal to the GE junction. Right IJ catheter tip terminates in the right atrium. Telemetry leads overlie the chest. Layering bilateral effusions adjacent passive atelectasis. Additional bandlike subsegmental atelectasis in the lungs as well. Stable borderline cardiomegaly accounting for portable technique. No acute osseous or soft tissue abnormality. Degenerative changes are present in the imaged spine and shoulders. IMPRESSION: 1. Endotracheal tube tip terminates 2.2 cm from the expected location of the carina. Could consider retraction 2 cm to the mid trachea. 2. Transesophageal tube tip and side port distal to the GE junction. 3. Right IJ catheter tip terminates in the right atrium. 4. Layering bilateral effusions with adjacent passive atelectasis and vascular congestion. 5. Stable borderline cardiomegaly. Electronically Signed   By: Lovena Le M.D.   On: 04/30/2020 01:14   DG CHEST PORT 1 VIEW  Result Date: 05/16/2020 CLINICAL DATA:  Endotracheal tube placement EXAM: PORTABLE CHEST 1 VIEW COMPARISON:  April 29, 2020 FINDINGS: The endotracheal tube terminates above the carina. The lung volumes are low. Again identified are bilateral pleural effusions with adjacent atelectasis. The heart size is stable. There is no pneumothorax. The enteric tube terminates below the left hemidiaphragm. IMPRESSION: Lines and tubes as above.   Otherwise, stable exam. Electronically Signed   By: Constance Holster M.D.   On: 04/30/2020 22:04   DG Chest Portable 1 View  Result Date: 04/19/2020 CLINICAL DATA:  Cough, wheezing EXAM: PORTABLE CHEST 1 VIEW COMPARISON:  01/09/2020 FINDINGS: Low lung volumes. Minimal atelectasis or scarring at the left lung base. No pleural effusion or pneumothorax. Heart size is within normal limits for technique. IMPRESSION: Minimal atelectasis or scarring at the left lung base. Electronically Signed   By: Macy Mis M.D.   On: 04/19/2020 12:18   DG Abd Portable 2 Views  Result Date: 05/18/2020 CLINICAL DATA:  Abdominal distension and pain. EXAM: PORTABLE ABDOMEN - 2 VIEW COMPARISON:  04/19/2020 FINDINGS: There is mild gaseous distension of the stomach. No dilated loops of large or small bowel. There is no evidence of free air. No radio-opaque calculi or other significant radiographic abnormality is seen. IMPRESSION: Nonobstructive bowel gas pattern. Electronically Signed   By: Kerby Moors M.D.   On: 05/07/2020 06:12   DG Swallowing Func-Speech Pathology  Result Date: 05/04/2020 Objective Swallowing Evaluation: Type of Study: MBS-Modified Barium Swallow Study  Patient Details Name: Taishawn Smaldone MRN: [id number] Date of Birth: 1950/09/08 Today's Date: 05/04/2020 Time: SLP Start Time (ACUTE ONLY): 0854 -SLP Stop Time (ACUTE ONLY): 0910 SLP Time Calculation (min) (ACUTE ONLY): 16 min Past Medical History: Past Medical History: Diagnosis Date . Cancer ([place])   Mantle Cell Lymphoma . Diabetes mellitus without complication ([place])  . Hypertension  Past Surgical History: Past Surgical History: Procedure Laterality Date . APPENDECTOMY   . TONSILLECTOMY   HPI: Mick Tanguma is a 69 y.o. male with medical history significant of mantle cell lymphoma on CHOP. Presenting with dyspnea and ab distention. Reports  2 weeks of shortness of breath that has worsened in the last 2 days. ETT 11/7-11/9.  Subjective: alert at bedside  Assessment / Plan / Recommendation CHL IP CLINICAL IMPRESSIONS 05/04/2020 Clinical Impression Pt demonstrated oropharyngeal dysphagia with aspiration of nectar thick barium marked by delayed initiation and incompetent laryngeal closure. Barium aggregated in his valleculae and pyriform sinuses for approximately 4 seconds filling pyriforms and spilling to vocal cords prior to laryngeal closure. Subsequent trial nectar was aspirated with cough not clearing trachea. He coughed intermittently throughout without new penetrates/aspirates suspect from previous intrusion. Mild delay with honey thick with mild pharyngeal residue. Inconsistent suboptimal movement of hyoid anteriorally and superiorally. Pt clearly fatigued at onset of and during study with increased respirations and decreased endurance. Compensatory strategies not attempted given level of current ability to perform from medical standpoint. Mild lingual residue cleared with independent swallows. Recommend he initiate Dys 1 (puree), honey thick liquid cautiously with pills whole in puree, support for self feeding and rest breaks. Recommendations may be modified if there are changes to plan of care in terms of Palliative.          SLP Visit Diagnosis Dysphagia, oropharyngeal phase (R13.12) Attention and concentration deficit following -- Frontal lobe and executive function deficit following -- Impact on safety and function Moderate aspiration risk   CHL IP TREATMENT RECOMMENDATION 05/04/2020 Treatment Recommendations Therapy as outlined in treatment plan below   Prognosis 05/04/2020 Prognosis for Safe Diet Advancement Good Barriers to Reach Goals (No Data) Barriers/Prognosis Comment -- CHL IP DIET RECOMMENDATION 05/04/2020 SLP Diet Recommendations Honey thick liquids;Dysphagia 1 (Puree) solids Liquid Administration via Cup;No straw Medication Administration Whole meds with puree Compensations Slow rate;Small sips/bites;Other (Comment) Postural Changes Seated  upright at 90 degrees   CHL IP OTHER RECOMMENDATIONS 05/04/2020 Recommended Consults -- Oral Care Recommendations Oral care BID Other Recommendations Order thickener from pharmacy   CHL IP FOLLOW UP RECOMMENDATIONS 05/04/2020 Follow up Recommendations Other (comment)   CHL IP FREQUENCY AND DURATION 05/04/2020 Speech Therapy Frequency (ACUTE ONLY) min 2x/week Treatment Duration 2 weeks      CHL IP ORAL PHASE 05/04/2020 Oral Phase Impaired Oral - Pudding Teaspoon -- Oral - Pudding Cup -- Oral - Honey Teaspoon -- Oral - Honey Cup Lingual/palatal residue Oral - Nectar Teaspoon -- Oral - Nectar Cup Lingual/palatal residue Oral - Nectar Straw -- Oral - Thin Teaspoon -- Oral - Thin Cup -- Oral - Thin Straw -- Oral - Puree -- Oral - Mech Soft -- Oral - Regular Lingual/palatal residue Oral - Multi-Consistency -- Oral - Pill -- Oral Phase - Comment --  CHL IP PHARYNGEAL PHASE 05/04/2020 Pharyngeal Phase Impaired Pharyngeal- Pudding Teaspoon -- Pharyngeal -- Pharyngeal- Pudding Cup -- Pharyngeal -- Pharyngeal- Honey Teaspoon -- Pharyngeal -- Pharyngeal- Honey Cup Delayed swallow initiation-vallecula;Pharyngeal residue - pyriform;Pharyngeal residue - valleculae Pharyngeal -- Pharyngeal- Nectar Teaspoon -- Pharyngeal -- Pharyngeal- Nectar Cup Delayed swallow initiation-pyriform sinuses;Penetration/Aspiration during swallow;Pharyngeal residue - pyriform Pharyngeal Material enters airway, remains ABOVE vocal cords and not ejected out;Material enters airway, passes BELOW cords and not ejected out despite cough attempt by patient Pharyngeal- Nectar Straw -- Pharyngeal -- Pharyngeal- Thin Teaspoon -- Pharyngeal -- Pharyngeal- Thin Cup -- Pharyngeal -- Pharyngeal- Thin Straw -- Pharyngeal -- Pharyngeal- Puree -- Pharyngeal -- Pharyngeal- Mechanical Soft -- Pharyngeal -- Pharyngeal- Regular Pharyngeal residue - posterior pharnyx Pharyngeal -- Pharyngeal- Multi-consistency -- Pharyngeal -- Pharyngeal- Pill -- Pharyngeal -- Pharyngeal  Comment --  CHL IP CERVICAL ESOPHAGEAL PHASE 05/04/2020 Cervical Esophageal Phase WFL Pudding Teaspoon --  Pudding Cup -- Honey Teaspoon -- Honey Cup -- Nectar Teaspoon -- Nectar Cup -- Nectar Straw -- Thin Teaspoon -- Thin Cup -- Thin Straw -- Puree -- Mechanical Soft -- Regular -- Multi-consistency -- Pill -- Cervical Esophageal Comment -- [place] Siren 05/04/2020, 10:56 AM Orbie Pyo Colvin Caroli.Ed Actor Pager [phone] Office [phone]              ECHOCARDIOGRAM COMPLETE  Result Date: 04/20/2020    ECHOCARDIOGRAM REPORT   Patient Name:   DERYCK HIPPLER Date of Exam: 04/20/2020 Medical Rec #:  [id number]    Height:       64.0 in Accession #:    [id number]   Weight:       210.1 lb Date of Birth:  [birth year]   BSA:          1.998 m Patient Age:    24 years     BP:           156/83 mmHg Patient Gender: M            HR:           88 bpm. Exam Location:  Inpatient Procedure: 2D Echo, Cardiac Doppler and Color Doppler Indications:    Chemo evaluation  History:        Patient has prior history of Echocardiogram examinations, most                 recent 11/14/2019. COPD; Risk Factors:Hypertension and Diabetes.                 Mantle cell lymphoma.  Sonographer:    Dustin Flock Referring Phys: [place]  1. Left ventricular ejection fraction, by estimation, is 65 to 70%. The left ventricle has normal function. The left ventricle has no regional wall motion abnormalities. Left ventricular diastolic parameters are consistent with Grade I diastolic dysfunction (impaired relaxation).  2. Right ventricular systolic function is normal. The right ventricular size is normal. Tricuspid regurgitation signal is inadequate for assessing PA pressure.  3. The mitral valve is grossly normal. Trivial mitral valve regurgitation. No evidence of mitral stenosis.  4. The aortic valve is tricuspid. There is mild calcification of the aortic valve. There is mild thickening of the  aortic valve. Aortic valve regurgitation is not visualized. Mild aortic valve sclerosis is present, with no evidence of aortic valve stenosis.  5. The inferior vena cava is normal in size with greater than 50% respiratory variability, suggesting right atrial pressure of 3 mmHg. Comparison(s): Changes from prior study are noted. EF is now 65-70%. FINDINGS  Left Ventricle: Left ventricular ejection fraction, by estimation, is 65 to 70%. The left ventricle has normal function. The left ventricle has no regional wall motion abnormalities. The left ventricular internal cavity size was normal in size. There is  no left ventricular hypertrophy. Left ventricular diastolic parameters are consistent with Grade I diastolic dysfunction (impaired relaxation). Normal left ventricular filling pressure. Right Ventricle: The right ventricular size is normal. No increase in right ventricular wall thickness. Right ventricular systolic function is normal. Tricuspid regurgitation signal is inadequate for assessing PA pressure. Left Atrium: Left atrial size was normal in size. Right Atrium: Right atrial size was normal in size. Pericardium: Trivial pericardial effusion is present. Presence of pericardial fat pad. Mitral Valve: The mitral valve is grossly normal. Trivial mitral valve regurgitation. No evidence of mitral valve stenosis. Tricuspid Valve: The tricuspid valve is grossly normal. Tricuspid valve regurgitation is  not demonstrated. No evidence of tricuspid stenosis. Aortic Valve: The aortic valve is tricuspid. There is mild calcification of the aortic valve. There is mild thickening of the aortic valve. Aortic valve regurgitation is not visualized. Mild aortic valve sclerosis is present, with no evidence of aortic valve stenosis. Aortic valve mean gradient measures 11.1 mmHg. Aortic valve peak gradient measures 16.7 mmHg. Aortic valve area, by VTI measures 2.42 cm. Pulmonic Valve: The pulmonic valve was grossly normal. Pulmonic  valve regurgitation is not visualized. No evidence of pulmonic stenosis. Aorta: The aortic root is normal in size and structure. Venous: The right upper pulmonary vein is normal. The inferior vena cava is normal in size with greater than 50% respiratory variability, suggesting right atrial pressure of 3 mmHg. IAS/Shunts: The atrial septum is grossly normal. Additional Comments: There is a small pleural effusion in the left lateral region.  LEFT VENTRICLE PLAX 2D LVIDd:         4.30 cm  Diastology LVIDs:         2.60 cm  LV e' medial:    8.81 cm/s LV PW:         1.10 cm  LV E/e' medial:  7.2 LV IVS:        1.10 cm  LV e' lateral:   7.51 cm/s LVOT diam:     2.30 cm  LV E/e' lateral: 8.4 LV SV:         92 LV SV Index:   46 LVOT Area:     4.15 cm  RIGHT VENTRICLE RV Basal diam:  3.20 cm RV S prime:     11.00 cm/s TAPSE (M-mode): 2.9 cm LEFT ATRIUM             Index       RIGHT ATRIUM           Index LA diam:        3.30 cm 1.65 cm/m  RA Area:     15.30 cm LA Vol (A2C):   43.2 ml 21.62 ml/m RA Volume:   37.60 ml  18.82 ml/m LA Vol (A4C):   30.7 ml 15.37 ml/m LA Biplane Vol: 36.9 ml 18.47 ml/m  AORTIC VALVE AV Area (Vmax):    2.42 cm AV Area (Vmean):   2.14 cm AV Area (VTI):     2.42 cm AV Vmax:           204.20 cm/s AV Vmean:          159.222 cm/s AV VTI:            0.381 m AV Peak Grad:      16.7 mmHg AV Mean Grad:      11.1 mmHg LVOT Vmax:         119.00 cm/s LVOT Vmean:        82.200 cm/s LVOT VTI:          0.222 m LVOT/AV VTI ratio: 0.58  AORTA Ao Root diam: 3.10 cm MITRAL VALVE MV Area (PHT): 4.06 cm     SHUNTS MV Decel Time: 187 msec     Systemic VTI:  0.22 m MV E velocity: 63.40 cm/s   Systemic Diam: 2.30 cm MV A velocity: 104.00 cm/s MV E/A ratio:  0.61 Eleonore Chiquito MD Electronically signed by Eleonore Chiquito MD Signature Date/Time: 04/20/2020/10:32:25 AM    Final    [place] ASCITES (ABDOMEN LIMITED)  Result Date: 04/19/2020 CLINICAL DATA:  69 year old male with abdominal distension, ascites EXAM:  LIMITED ABDOMEN ULTRASOUND  FOR ASCITES TECHNIQUE: Limited ultrasound survey for ascites was performed in all four abdominal quadrants. COMPARISON:  CT abdomen/pelvis 04/19/2020 FINDINGS: Sonographic interrogation of the abdomen was performed in anticipation of paracentesis. Unfortunately, the volume is a ascites is quite small and insufficient for drainage. There is however extensive omental caking and peritoneal nodularity. IMPRESSION: 1. Small volume ascites, insufficient for paracentesis. 2. Extensive omental caking and peritoneal nodularity consistent with peritoneal carcinomatosis. Electronically Signed   By: Jacqulynn Cadet M.D.   On: 04/19/2020 16:53   VAS [place] LOWER EXTREMITY VENOUS (DVT)  Result Date: 05/01/2020  Lower Venous DVT Study Indications: Pain, Swelling, and Edema.  Risk Factors: Immobility confirmed PE. Performing Technologist: Griffin Basil RCT RDMS  Examination Guidelines: A complete evaluation includes B-mode imaging, spectral Doppler, color Doppler, and power Doppler as needed of all accessible portions of each vessel. Bilateral testing is considered an integral part of a complete examination. Limited examinations for reoccurring indications may be performed as noted. The reflux portion of the exam is performed with the patient in reverse Trendelenburg.  +---------+---------------+---------+-----------+----------+--------------+ RIGHT    CompressibilityPhasicitySpontaneityPropertiesThrombus Aging +---------+---------------+---------+-----------+----------+--------------+ CFV      Full           Yes      Yes                                 +---------+---------------+---------+-----------+----------+--------------+ SFJ      Full                                                        +---------+---------------+---------+-----------+----------+--------------+ FV Prox  Full                                                         +---------+---------------+---------+-----------+----------+--------------+ FV Mid   Full                                                        +---------+---------------+---------+-----------+----------+--------------+ FV DistalFull                                                        +---------+---------------+---------+-----------+----------+--------------+ PFV      Full                                                        +---------+---------------+---------+-----------+----------+--------------+ POP      Full           Yes      Yes                                 +---------+---------------+---------+-----------+----------+--------------+  PTV      Full                                                        +---------+---------------+---------+-----------+----------+--------------+ PERO     Full                                                        +---------+---------------+---------+-----------+----------+--------------+   +---------+---------------+---------+-----------+----------+--------------+ LEFT     CompressibilityPhasicitySpontaneityPropertiesThrombus Aging +---------+---------------+---------+-----------+----------+--------------+ CFV      Full           Yes      Yes                                 +---------+---------------+---------+-----------+----------+--------------+ SFJ      Full                                                        +---------+---------------+---------+-----------+----------+--------------+ FV Prox  Full                                                        +---------+---------------+---------+-----------+----------+--------------+ FV Mid   Full                                                        +---------+---------------+---------+-----------+----------+--------------+ FV DistalFull                                                         +---------+---------------+---------+-----------+----------+--------------+ PFV      Full                                                        +---------+---------------+---------+-----------+----------+--------------+ POP      Full           Yes      Yes                                 +---------+---------------+---------+-----------+----------+--------------+ PTV      Full                                                        +---------+---------------+---------+-----------+----------+--------------+  PERO     Full                                                        +---------+---------------+---------+-----------+----------+--------------+     Summary: RIGHT: - There is no evidence of deep vein thrombosis in the lower extremity.  - No cystic structure found in the popliteal fossa.  LEFT: - There is no evidence of deep vein thrombosis in the lower extremity.  - No cystic structure found in the popliteal fossa.  *See table(s) above for measurements and observations. Electronically signed by Curt Jews MD on 05/01/2020 at 2:50:45 PM.    Final    ECHOCARDIOGRAM LIMITED  Result Date: 04/30/2020    ECHOCARDIOGRAM LIMITED REPORT   Patient Name:   ELEUTERIO DOLLAR Date of Exam: 04/30/2020 Medical Rec #:  [id number]    Height:       64.0 in Accession #:    [id number]   Weight:       205.0 lb Date of Birth:  [birth year]   BSA:          1.977 m Patient Age:    66 years     BP:           114/60 mmHg Patient Gender: M            HR:           109 bpm. Exam Location:  Inpatient Procedure: Limited Echo, Limited Color Doppler and Cardiac Doppler Indications:    Pulmonary Embolus I26.99  History:        Patient has prior history of Echocardiogram examinations, most                 recent 04/20/2020. Mantle cell lymphoma; Risk                 Factors:Hypertension and Diabetes.  Sonographer:    Mikki Santee RDCS (AE) Referring Phys: [id number] Jonnie Finner  Sonographer Comments: Echo performed with  patient supine and on artificial respirator. IMPRESSIONS  1. Cystic structure noted in liver on Subcostal images.  2. Left ventricular ejection fraction, by estimation, is 60 to 65%. The left ventricle has normal function. The left ventricle has no regional wall motion abnormalities.  3. Right ventricular systolic function is normal. The right ventricular size is normal.  4. The pericardial effusion is posterior to the left ventricle.  5. The mitral valve is normal in structure. No evidence of mitral valve regurgitation. No evidence of mitral stenosis.  6. The aortic valve is normal in structure. Aortic valve regurgitation is not visualized. No aortic stenosis is present.  7. The inferior vena cava is normal in size with greater than 50% respiratory variability, suggesting right atrial pressure of 3 mmHg. FINDINGS  Left Ventricle: Left ventricular ejection fraction, by estimation, is 60 to 65%. The left ventricle has normal function. The left ventricle has no regional wall motion abnormalities. The left ventricular internal cavity size was normal in size. There is  no left ventricular hypertrophy. Right Ventricle: The right ventricular size is normal. No increase in right ventricular wall thickness. Right ventricular systolic function is normal. Left Atrium: Left atrial size was normal in size. Right Atrium: Right atrial size was normal in size. Pericardium: Trivial pericardial effusion is present. The pericardial effusion is posterior to the  left ventricle. Mitral Valve: The mitral valve is normal in structure. No evidence of mitral valve stenosis. Tricuspid Valve: The tricuspid valve is normal in structure. Tricuspid valve regurgitation is mild . No evidence of tricuspid stenosis. Aortic Valve: The aortic valve is normal in structure. Aortic valve regurgitation is not visualized. No aortic stenosis is present. Pulmonic Valve: The pulmonic valve was normal in structure. Pulmonic valve regurgitation is trivial. No  evidence of pulmonic stenosis. Aorta: The aortic root is normal in size and structure. Venous: The inferior vena cava is normal in size with greater than 50% respiratory variability, suggesting right atrial pressure of 3 mmHg. IAS/Shunts: No atrial level shunt detected by color flow Doppler. Additional Comments: Cystic structure noted in liver on Subcostal images. LEFT VENTRICLE PLAX 2D LVIDd:         4.20 cm LVIDs:         2.60 cm LV PW:         1.00 cm LV IVS:        1.10 cm LVOT diam:     2.30 cm LVOT Area:     4.15 cm  RIGHT VENTRICLE RV S prime:     30.80 cm/s TAPSE (M-mode): 1.8 cm LEFT ATRIUM         Index LA diam:    3.00 cm 1.52 cm/m   AORTA Ao Root diam: 3.40 cm  SHUNTS Systemic Diam: 2.30 cm Jenkins Rouge MD Electronically signed by Jenkins Rouge MD Signature Date/Time: 04/30/2020/9:18:44 AM    Final    [place] EKG SITE RITE  Result Date: 05/08/2020 If Site Rite image not attached, placement could not be confirmed due to current cardiac rhythm.  [place] EKG SITE RITE  Result Date: 05/03/2020 If Site Rite image not attached, placement could not be confirmed due to current cardiac rhythm.  [place] EKG SITE RITE  Result Date: 04/19/2020 If Site Rite image not attached, placement could not be confirmed due to current cardiac rhythm.   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 CD5+ clonal lymphoproliferative disorder" -04/26/2019 FISH revealed "Positive for p53 (17p13) deletion." -05/31/2019 PET/CT ([id number]) revealed "1. There are multiple prominent lymph nodes in the neck, both axilla, the retroperitoneum and pelvis with low level hypermetabolic activity consistent with chronic lymphocytic leukemia. Deauville 3 and 4. 2. Mild splenomegaly and mild generalized splenic hypermetabolic activity 3. No evidence of bone marrow  involvement." -06/10/2019 Flow Pathology Report ([id number]) revealed "Abnormal B-cell population." -06/10/2019 Surgical Pathology Report ([id number])  revealed "Atypical lymphoid proliferation consistent with mantle cell lymphoma." -09/19/19 PET Skull Base to Thigh ([id number])-- favorable response to current treatment -01/02/2020 PET/CT ([id number]) revealed "1. Continued further decrease in size and FDG accumulation in index cervical, axillary, retroperitoneal, and pelvic lymph nodes. No lymphadenopathy by size criteria today. FDG accumulation in these lymph nodes is compatible with Deauville 2 category. 2. Interval decrease in size with resolution of hypermetabolism associated with the spleen previously."  -04/19/2020 CT abdomen pelvis revealed "1. Dominant finding is new large (15 cm) lymphoid mass within LEFT upper quadrant. Mass is new from PET-CT 01/02/2020. Differential includes massive mesenteric adenopathy versus lymphoma of the small bowel or descending colon. No oral or IV contrast. 2. New periaortic retroperitoneal lymphadenopathy. New precordial Adenopathy. 3. Extensive fluid within the mesentery and omental thickening in LEFT lower quadrant. 4. Free fluid the pelvis with thin enhancing rim suggest peritonitis.  5. No evidence of bowel perforation. No evidence of bowel Obstruction." -04/24/2020 CTA chest and CT abdomen pelvis revealed "1. Study is positive for segmental sized pulmonary emboli in the left lung. These appear nonocclusive at this time, and there is no associated pulmonary hemorrhage to suggest frank pulmonary infarct. 2. Bulky soft tissue mass in the left upper quadrant with extensive lymphadenopathy in the chest and abdomen, and evidence of widespread intraperitoneal metastatic disease, as detailed above. 3. Small bilateral pleural effusions lying dependently. 4. Aortic atherosclerosis. 5. Tracheobronchomalacia. 6. Cholelithiasis. 7. Colonic diverticulosis."  2) Rigors from  Rituxan - needing demerol as premedication. Rituxan rate to be capped at 181m/hour  3) Thrombocytopenia from chemotherapy  4) Neutropenia due to recent chemotherapy  5) anemia  6) significant new abdominal distention due to lymphoma recurrence  7) new hypercalcemia- ?  Related to dehydration and  Lymphoma -resolved at this time  8) acute hypoxic respiratory failure-multifactorial due to abdominal distention, PE, HAP, and hypertensive emergency  9) left segmental PE  10) AKI  11) tumor lysis syndrome  12) pancytopenia related to chemotherapy and possible sepsis.  13) hemoptysis  PLAN: -The patient has mantle cell lymphoma with 17 P mutation that is resistant to chemotherapy.  He received 1 cycle of CHOP with no improvement in his left upper quadrant soft tissue mass and abdominal distention at this time. -Undergoing radiation to his left upper quadrant soft tissue mass.  Due to complete on 11/29. -We had planned to begin acalabrutinib with clinical status improves and his counts improve.  His platelet count continues to slowly improve but he continues to have anemia and leukopenia.  We will continue to hold for now but we hope to begin to start this medication soon. -The patient has received G-CSF already and recommend close monitoring of his CBC with differential.  - Transfuse for hemoglobin less than 8 and platelets less than 30,000 or active bleeding. -Anticoagulation currently on hold secondary to episode of hemoptysis this past weekend.  He has no recurrent hemoptysis.  Continue to hold for now. -Received rasburicase on 05/01/2020 and 05/03/2020 for hyperuricemia. Uric acid level has normalized. Continue allopurinol. -Renal function worsening secondary to sepsis.  Consider nephrology consult if no improvement.   LOS: 15 days   KMikey Bussing   ADDENDUM  .Patient was Personally and independently interviewed, examined and relevant elements of the history of present  illness were reviewed in details and an assessment and plan was created. All elements of the patient's history of present illness , assessment and plan were discussed in details with KMikey Bussing. The above documentation reflects our combined findings assessment and plan.  GSullivan LoneMD MS

## 2020-05-14 NOTE — Progress Notes (Signed)
[place] for Heparin Indication: pulmonary embolus  Allergies  Allergen Reactions  . Ivp Dye [Iodinated Diagnostic Agents] Shortness Of Breath    "allergic to CT scan dye", took benadryl with last scan and tolerated well    Patient Measurements: Height: 5\' 4"  (162.6 cm) (5'4") Weight: 100.2 kg (220 lb 14.4 oz) IBW/kg (Calculated) : 59.2 Heparin Dosing Weight: 81 kg  Vital Signs: Temp: 100.4 F (38 C) (11/22 0900) Temp Source: Core (Comment) (11/22 0900) BP: 122/52 (11/22 0900) Pulse Rate: 104 (11/22 0800)  Labs: Recent Labs    05/12/20 0115 05/12/20 0116 05/12/20 1111 05/12/20 1111 05/13/20 0549 05/13/20 1040 05/14/20 0434  HGB  --    < > 9.2*   < >  --  8.9* 8.0*  HCT  --    < > 29.1*  --   --  28.3* 24.9*  PLT  --    < > 60*  --   --  67* 71*  CREATININE 1.83*  --   --   --  2.93*  --  3.33*   < > = values in this interval not displayed.    Estimated Creatinine Clearance: 22.1 mL/min (A) (by C-G formula based on SCr of 3.33 mg/dL (H)).   Medications:  Infusions:  . ceFEPime (MAXIPIME) IV    . feeding supplement (VITAL AF 1.2 CAL) Stopped (05/13/20 0815)  . heparin    . propofol (DIPRIVAN) infusion 20 mcg/kg/min (05/14/20 0932)  . vancomycin      Assessment: Patient is a 69 y.o M with stage IV mantle cell lymphoma on chemotherapy (got CHOP on 04/21/20), presented to the ED on 11/7 with c/o SOB, groin swelling and emesis.  Chest CT showed acute PE.  Pharmacy is consulted to start heparin drip for VTE.  Significant Events: - Heparin stopped 11/14 PM for melenic stool, FOB + - GI consulted and found stool bag not melenic and is ok with resuming IV UFH.  Heparin resumed 11/15 after transfusion completed - Heparin stopped again 11/20 for bleeding   Today, 05/14/2020:  Plan to resume IV UFH  Discussed with CCM, will hold bolus and target low-goal  Goal of Therapy:  Heparin level 0.3-0.5 units/ml Monitor platelets  by anticoagulation protocol: Yes   Plan:   No bolus  Resume heparin infusion at 950 units/hr  Check HL in 8 hours  Daily heparin level and CBC  Continue to monitor s/s bleeding.  Dean Dixon D  05/14/2020, 10:25 AM

## 2020-05-14 NOTE — TOC Progression Note (Signed)
Transition of Care [facility]) - Progression Note    Patient Details  Name: Dean Dixon MRN: [id number] Date of Birth: 12/24/1950  [facility]) CM/SW Contact  Leeroy Cha, RN Phone Number: 05/14/2020, 9:01 AM  Clinical Narrative:    [facility] Events   11/07 Admitted to [facility] decompensated/ intubated 11/08 Weaned on PSV, lasix with 2.2L UOP  11/09 Extubated  11/11 Fever / leukocytosis improved, on abx  11/12 Mental status improved, on RA, palliative radiation  11/20 Reintubated for acute hypercapnic hypoxic respiratory failure Vent at 40% Fi02, Iv abx x2, iv sedation, flexiseal due to loose stools.  Following for progression. Expected Discharge Plan: Home/Self Care Barriers to Discharge: No Barriers Identified  Expected Discharge Plan and Services Expected Discharge Plan: Home/Self Care   Discharge Planning Services: CM Consult   Living arrangements for the past 2 months: Single Family Home                                       Social Determinants of Health (SDOH) Interventions    Readmission Risk Interventions No flowsheet data found.

## 2020-05-14 NOTE — Progress Notes (Signed)
Pharmacy Consult Note - IV heparin follow up  Labs: heparin level 0.12  A/P: Heparin level SUBtherapeutic after heparin restarted today (lower goal 0.3-0.5) and no bolus given with rate of 950 units/hr. Per RN, no reported bleeding or problems of note. Increase IV heparin to 1100 units/hr and will recheck heparin level 8 hours after rate change  Adrian Saran, PharmD, BCPS 05/14/2020 8:29 PM

## 2020-05-14 NOTE — Progress Notes (Signed)
SLP Cancellation Note  Patient Details Name: Dean Dixon MRN: [id number] DOB: 1951/04/18   Cancelled treatment:       Reason Eval/Treat Not Completed: Other (comment) (pt now on vent, will need to reconsult for speech if indicated. Thanks.)  Kathleen Lime, MS [facility] SLP Acute Rehab Services Office [phone] Pager [phone]    Macario Golds 05/14/2020, 1:25 PM

## 2020-05-14 NOTE — Progress Notes (Signed)
NAME:  Dean Dixon, MRN:  [id number], DOB:  Dec 26, 1950, LOS: 46 ADMISSION DATE:  04/30/2020, CONSULTATION DATE:  11/7/201 REFERRING MD:  [facility], CHIEF COMPLAINT:  Respiratory distress  Brief History   69 year old male with stage IV mantle cell lymphoma on CHOP presenting with N/V and progressive SOB over the last 2 weeks found to have acute left segmental PE, intubated 11/7-11/9 in setting hypertensive emergency/flash, reintubated 11/20 for multifactorial acute hypercapnic hypoxic respiratory failure   History of present illness   HPI obtained from medical chart review as patient is currently intubated and sedated on mechanical ventilation.  69 year old male with prior medical history of stage IV mantle cell lymphoma (diagnosed 06/20/2019) on CHOP, hypertension, diabetes type 2 who presented to [facility] on 11/7 with progressive shortness of breath over the last 2 weeks, worse over the last 2 days with above baseline abdominal distention, non bloody nausea/ vomiting, nonradiating intermittent midsternal chest tightness, and cough with lightly productive greenish nonbloody sputum.  Additionally reported ongoing swelling in his legs and testicles causing difficulty with urination.  Recent hospitalization 10/28 - 11/1 found to have new left upper quadrant abdominal mass and hypercalcemia.    In ER, he was afebrile, hypertensive, mildly tachypneic, and tachycardic with oxygen saturations of 98% on room air.  Noted to have multiple episodes of vomiting and retching treated with zofran.  KUB with non-obstructive gas pattern.   Labs noted for glucose 290 with HgbA1c 6.3, BUN 40, AG 17, lactic acid 3.2-> 2.3, troponin hs 52, BNP 133, D-dimer 2.47, Hgb 11.5/ HCT 35.5, platelets 100, neg COVID/ flu, UA noted turbid urine with 5 ketones, negative for leukocytes/ nitrates.  CXR showed small bilateral pleural effusion with atelectasis and low lung volumes.  He was pretreated given his contrast allergy. CTA PE and  CT abd/ pelvis showed segmental nonocclusive left pulmonary embolism without associated hemorrhage or pulmonary infarct.  Additionally noted bulky soft tissue mass in LUQ with extensive lymphadenopathy in the chest and abd with evidence of widespread intraperitoneal metastatic disease, small bilateral pleural effusion, and  tracheobronchomalacia. Started on heparin gtt per pharmacy.   He was admitted by [facility] to progressive care.  In unit, he was noted to become hypertensive with BP 250/100 with complaints of chest discomfort with noted tachypnea and lethargy.  He was treated with hydralazine and placed on nitro gtt for blood pressure management.  Repeated EKG was non acute.  ABG on NRB noted 7.291/ 56.7/ 275.  Bipap was attempted however patient kept pulling it off and therefore given concern for impending respiratory failure, he was intubated by anesthesia.  PCCM consulted for further management.   Past Medical History  Mantle cell lymphoma on CHOP, DM, HTN  Significant Hospital Events   11/07 Admitted to [facility]/ decompensated/ intubated 11/08 Weaned on PSV, lasix with 2.2L UOP  11/09 Extubated  11/11 Fever / leukocytosis improved, on abx  11/12 Mental status improved, on RA, palliative radiation  11/20 Reintubated for acute hypercapnic hypoxic respiratory failure  Consults:  PCCM 11/7, 11/20  Procedures:  ETT 11/7 >> 11/9 TLC R IJ 11/8 >> 11/12 ETT 11/19 >> PICC  Significant Diagnostic Tests:  11/7 KUB >> nonobstructive gas pattern 11/7 CTA PE/  CT abd/ pelvis >> Study is positive for segmental sized pulmonary emboli in the left lung. These appear nonocclusive at this time, and there is no associated pulmonary hemorrhage to suggest frank pulmonary infarct. Bulky soft tissue mass in the left upper quadrant with extensive lymphadenopathy in  the chest and abdomen, and evidence of widespread intraperitoneal metastatic disease. Small bilateral pleural effusions lying dependently. Aortic  atherosclerosis.  Tracheobronchomalacia. Cholelithiasis. Colonic diverticulosis. 11/8 [place] Ascites/ABD >> small volume ascites, insufficient for paracentesis, extensive omental caking and peritoneal nodularity consistent with peritoneal carcinomatosis  11/8 ECHO >> cystic structure noted in liver on subcostal images, LVEF 60-65%, no RWMA, RV systolic function normal, pericardial effusion is posterior to LV 11/8 LE Doppler >> negative for DVT 11/19 CXR bilateral effusions, atelectasis  Micro Data:  SARS 2/ flu 11/7 >> neg MRSA 11/7 >> neg UA 11/7 >> negative, 5 ketones BCx2 11/9 >>  Trach aspirate 11/21>GPC  Antimicrobials:  Previously on azithro/ ceftriaxone, stopped 10/30 Cefepime 11/8 >> 11/14  Interim history/subjective:  Hemoptysis remains resolved. He denies complaints. Had nonbloody BMs overnight. TF held due to distention yesterday.  Objective   Blood pressure (!) 121/56, pulse 98, temperature (!) 100.4 F (38 C), resp. rate (!) 27, height 5\' 4"  (1.626 m), weight 100.2 kg, SpO2 96 %.    Vent Mode: PRVC FiO2 (%):  [40 %] 40 % Set Rate:  [22 bmp] 22 bmp Vt Set:  [470 mL] 470 mL PEEP:  [5 cmH20] 5 cmH20 Pressure Support:  [5 cmH20] 5 cmH20 Plateau Pressure:  [7 cmH20-13 cmH20] 7 cmH20   Intake/Output Summary (Last 24 hours) at 05/14/2020 0923 Last data filed at 05/14/2020 0745 Gross per 24 hour  Intake 2226.67 ml  Output 755 ml  Net 1471.67 ml   Filed Weights   05/12/20 0458 05/13/20 0500 05/14/20 0450  Weight: 97.6 kg 100.8 kg 100.2 kg   Physical Exam: General: Critically ill-appearing man lying in bed intubated, lightly sedated HEENT: [place]/AT, eyes anicteric, ETT in place Neuro: RASS 0, moving all extremities spontaneously, nodding to answer questions.  Comprehension appears intact. CV: Tachycardic, regular rhythm PULM: Ongoing thick tan secretions from endotracheal tube, no blood.  Rhonchi. GI: Distended, soft, nontender Extremities: Upper extremity edema  bilaterally, right upper extremity PICC with bruising.  No clubbing or cyanosis.  Less lower extremity edema. Skin: Ecchymosis on right upper extremity, left upper extremity unchanged.  No rashes.   Resolved Hospital Problem list    DKA Hypercalcemia TLS  Assessment & Plan:   Acute hypoxic & hypercapnic respiratory failure: Likely related predominantly to extravascular volume overload with effusions and pulmonary edema. LUQ mass, ascites impairing respiratory mechanics and contributing to atelectasis with V/Q mismatch/shunt. His hemoptysis seems improved.  It doesn't appear that his PE burden is newly worse based on bedside ultrasound overnight, stable heart rate and blood pressure. Hemoptysis likely due to lung infarction from PE, less likely due to an infection, though purulent drainage today raises concern for pneumonia. Intubated for acute hypercapnia, large blood clots suctioned out prior to intubation, potentially precluding adequate ventilation. HAP- febrile overnight, remains leukopenic. -Continue holding heparin today. -Holding additional diuresis given AKI -Continue low tidal volume ventilation, 4 to 8 cc/kg ideal body weight with goal plateau less than 30 and driving pressure less than 15.  Titrate down PEEP and FiO2 as able per ARDS protocol. -Daily SAT and SBT.  Monitoring for volume of hemoptysis.  His lung disease continues to improve off anticoagulation, no need for bronchoscopy.  If this worsens, will require bronchoscopy for localization.  He will remain intubated today.  We will not plan to extubate prior to leaving the unit for radiation.  Will assess for extubation this afternoon. -Target RASS 0 to -1, fentanyl and propofol.  Periodic monitoring of triglycerides. -Start  empiric antibiotics- vanc + cefepime   Acute left segmental pulmonary embolism  In setting of known malignancy Has history of previous need for Coumadin about 20 years ago-unsure if this was an unprovoked  VTE in the past -Continue holding heparin for bleeding  Hypernatremia-resolved -Continue free water flushes -Holding additional Lasix; 500 cc bolus plus albumin -Strict I's/O -Avoid high sodium containing IVF  Type 2 diabetes with hyperglycemia -SSI as needed -Continue Lantus 8 units daily. -Goal BG 140-180 while admitted to the ICU  Acute kidney injury-worsened despite positive fluid balance, likely due to sepsis Hypernatremia-resolved -Continue enteral free water -Continue to monitor renal function -Strict I's/O -Renally dose meds and avoid nephrotoxic meds  Pancytopenia likely due to chemotherapy -Resume heparin without a bolus today -Continue to monitor for bleeding -Transfuse for hemoglobin less than 7 or hemodynamically significant bleeding  Stage IV mantle cell lymphoma with LUQ mass/ extensive metastatic disease Last CHOP 10/30, follows with Dr. Irene Limbo -appreciate ONC input  -palliative radiation starting today -appreciate palliative care input > full scope care time.  Need to readdress goals of care with oncology.  Appreciate their assistance. -PT consult / mobilize when stable  History of tumor lysis syndrome-uric acid level stable -Continue to monitor  GoC -I discussed the patient's care with his friend and medical power of attorney Nicole Kindred and Bernie wife.  He had been doing very well over the summer, tolerating a cruise in the [place].  Over the last month prior to admission he had deteriorating functional status, but lives alone and had been continuing to care for himself.  He is an " eternal optimist" and would want to continue aggressive therapy.  He would not want his final days in life to be spent on a ventilator.  They understand my concerns regarding prolonged admission and repeat hospital admission with background progressive cancer despite aggressive therapy.  They are aware that he would require significant rehabilitation prior to being discharged,  but feel that he would want to continue aggressive care at this time.  We discussed the potential need for bronchoscopy if his hemoptysis worsens, which they verbally consented to.  They will be out of town for few days but plan to come back on Wednesday. -Power of attorney updated on 11/21 via phone   Best practice:  Diet: NPO Pain/Anxiety/Delirium protocol (if indicated): PAD protocol  VAP protocol (if indicated): Yes DVT prophylaxis: Systemic heparin on hold GI prophylaxis: PPI Glucose control: CBG q4h Mobility: BR Code Status: Full code Family Communication: friends updated via phone 11/21 disposition: ICU  Labs   CBC: Recent Labs  Lab 05/08/20 0300 05/08/20 0300 05/09/20 0427 05/09/20 0427 05/10/20 0500 05/10/20 0500 05/11/20 0723 05/11/20 0723 05/11/20 2044 05/12/20 0116 05/12/20 1111 05/13/20 1040 05/14/20 0434  WBC 3.1*   < > 3.1*   < > 3.1*   < > 2.7*  --   --  3.6* 3.1* 3.2* 2.5*  NEUTROABS 2.4  --  2.4  --  2.4  --  2.0  --   --  2.8  --   --   --   HGB 11.2*   < > 10.6*   < > 10.8*   < > 10.2*   < > 9.9* 10.7* 9.2* 8.9* 8.0*  HCT 34.9*   < > 32.8*   < > 33.3*   < > 32.7*   < > 30.7* 33.9* 29.1* 28.3* 24.9*  MCV 91.8   < > 92.1   < > 92.2   < >  94.5  --   --  95.0 94.2 94.6 94.7  PLT 36*   < > 39*   < > 45*   < > 51*  --   --  63* 60* 67* 71*   < > = values in this interval not displayed.    Basic Metabolic Panel: Recent Labs  Lab 05/10/20 0500 05/11/20 0723 05/12/20 0115 05/13/20 0549 05/14/20 0434  NA 146* 147* 143 144 139  K 3.2* 4.2 4.9 3.9 4.0  CL 112* 116* 111 115* 108  CO2 22 21* 22 20* 16*  GLUCOSE 107* 122* 189* 117* 101*  BUN 46* 48* 53* 82* 101*  CREATININE 1.71* 1.79* 1.83* 2.93* 3.33*  CALCIUM 7.6* 7.8* 7.9* 7.8* 7.7*  MG 2.1 2.2  --  2.0 2.2  PHOS 2.5 3.0  --  4.5 6.2*   GFR: Estimated Creatinine Clearance: 22.1 mL/min (A) (by C-G formula based on SCr of 3.33 mg/dL (H)). Recent Labs  Lab 05/12/20 0116 05/12/20 1111  05/13/20 1040 05/14/20 0434  WBC 3.6* 3.1* 3.2* 2.5*  LATICACIDVEN 1.0  --   --   --     Liver Function Tests: Recent Labs  Lab 05/08/20 0300 05/09/20 0427 05/10/20 0500 05/11/20 0723 05/12/20 0115  AST 21 18 16 16 18   ALT 15 14 12 12 13   ALKPHOS 69 67 66 69 79  BILITOT 0.7 0.6 0.5 0.6 0.7  PROT 5.3* 5.3* 5.2* 4.9* 5.4*  ALBUMIN 2.8* 2.7* 2.7* 2.6* 2.9*   No results for input(s): LIPASE, AMYLASE in the last 168 hours. No results for input(s): AMMONIA in the last 168 hours.  ABG    Component Value Date/Time   PHART 7.146 (LL) 05/12/2020 0112   PCO2ART 68.3 (HH) 05/12/2020 0112   PO2ART 93.0 05/12/2020 0112   HCO3 21.8 05/12/2020 0750   TCO2 29 05/07/2020 2146   ACIDBASEDEF 4.0 (H) 05/12/2020 0750   O2SAT 79.2 05/12/2020 0750     Coagulation Profile: No results for input(s): INR, PROTIME in the last 168 hours.  Cardiac Enzymes: No results for input(s): CKTOTAL, CKMB, CKMBINDEX, TROPONINI in the last 168 hours.  HbA1C: Hgb A1c MFr Bld  Date/Time Value Ref Range Status  05/17/2020 05:49 PM 6.3 (H) 4.8 - 5.6 % Final    Comment:    (NOTE) Pre diabetes:          5.7%-6.4%  Diabetes:              >6.4%  Glycemic control for   <7.0% adults with diabetes   11/14/2019 06:13 AM 5.4 4.8 - 5.6 % Final    Comment:    (NOTE) Pre diabetes:          5.7%-6.4% Diabetes:              >6.4% Glycemic control for   <7.0% adults with diabetes     CBG: Recent Labs  Lab 05/13/20 1613 05/13/20 2012 05/13/20 2331 05/14/20 0400 05/14/20 0815  GLUCAP 146* 102* 100* 94 100*    This patient is critically ill with multiple organ system failure which requires frequent high complexity decision making, assessment, support, evaluation, and titration of therapies. This was completed through the application of advanced monitoring technologies and extensive interpretation of multiple databases. During this encounter critical care time was devoted to patient care services  described in this note for 39 minutes.  Julian Hy, DO 05/14/20 9:28 AM [facility]

## 2020-05-15 ENCOUNTER — Inpatient Hospital Stay (HOSPITAL_COMMUNITY): Payer: Medicare Other

## 2020-05-15 ENCOUNTER — Ambulatory Visit
Admit: 2020-05-15 | Discharge: 2020-05-15 | Disposition: A | Discharge status: Home / Self Care | Payer: Medicare Other | Attending: Radiation Oncology | Admitting: Radiation Oncology

## 2020-05-15 DIAGNOSIS — E872 Acidosis: Secondary | ICD-10-CM | POA: Diagnosis not present

## 2020-05-15 DIAGNOSIS — J9621 Acute and chronic respiratory failure with hypoxia: Secondary | ICD-10-CM | POA: Diagnosis not present

## 2020-05-15 DIAGNOSIS — N179 Acute kidney failure, unspecified: Secondary | ICD-10-CM | POA: Diagnosis not present

## 2020-05-15 DIAGNOSIS — J9601 Acute respiratory failure with hypoxia: Secondary | ICD-10-CM | POA: Diagnosis not present

## 2020-05-15 DIAGNOSIS — Z9911 Dependence on respirator [ventilator] status: Secondary | ICD-10-CM | POA: Diagnosis not present

## 2020-05-15 DIAGNOSIS — C8318 Mantle cell lymphoma, lymph nodes of multiple sites: Secondary | ICD-10-CM | POA: Diagnosis not present

## 2020-05-15 LAB — BLOOD GAS, ARTERIAL
Acid-base deficit: 10.6 mmol/L — ABNORMAL HIGH (ref 0.0–2.0)
Bicarbonate: 14.7 mmol/L — ABNORMAL LOW (ref 20.0–28.0)
O2 Saturation: 98.2 %
Patient temperature: 98.6
pCO2 arterial: 32 mmHg (ref 32.0–48.0)
pH, Arterial: 7.284 — ABNORMAL LOW (ref 7.350–7.450)
pO2, Arterial: 110 mmHg — ABNORMAL HIGH (ref 83.0–108.0)

## 2020-05-15 LAB — CULTURE, RESPIRATORY W GRAM STAIN: Culture: NORMAL

## 2020-05-15 LAB — BASIC METABOLIC PANEL
Anion gap: 17 — ABNORMAL HIGH (ref 5–15)
Anion gap: 16 — ABNORMAL HIGH (ref 5–15)
Anion gap: 18 — ABNORMAL HIGH (ref 5–15)
BUN: 112 mg/dL — ABNORMAL HIGH (ref 8–23)
BUN: 134 mg/dL — ABNORMAL HIGH (ref 8–23)
BUN: 133 mg/dL — ABNORMAL HIGH (ref 8–23)
CO2: 12 mmol/L — ABNORMAL LOW (ref 22–32)
CO2: 18 mmol/L — ABNORMAL LOW (ref 22–32)
CO2: 23 mmol/L (ref 22–32)
Calcium: 7.3 mg/dL — ABNORMAL LOW (ref 8.9–10.3)
Calcium: 7.3 mg/dL — ABNORMAL LOW (ref 8.9–10.3)
Calcium: 6.5 mg/dL — ABNORMAL LOW (ref 8.9–10.3)
Chloride: 113 mmol/L — ABNORMAL HIGH (ref 98–111)
Chloride: 109 mmol/L (ref 98–111)
Chloride: 103 mmol/L (ref 98–111)
Creatinine, Ser: 3.35 mg/dL — ABNORMAL HIGH (ref 0.61–1.24)
Creatinine, Ser: 3.13 mg/dL — ABNORMAL HIGH (ref 0.61–1.24)
Creatinine, Ser: 3.3 mg/dL — ABNORMAL HIGH (ref 0.61–1.24)
GFR, Estimated: 19 mL/min — ABNORMAL LOW (ref >60)
GFR, Estimated: 21 mL/min — ABNORMAL LOW (ref >60)
GFR, Estimated: 19 mL/min — ABNORMAL LOW (ref >60)
Glucose, Bld: 119 mg/dL — ABNORMAL HIGH (ref 70–99)
Glucose, Bld: 146 mg/dL — ABNORMAL HIGH (ref 70–99)
Glucose, Bld: 132 mg/dL — ABNORMAL HIGH (ref 70–99)
Potassium: 3.8 mmol/L (ref 3.5–5.1)
Potassium: 4.1 mmol/L (ref 3.5–5.1)
Potassium: 3.4 mmol/L — ABNORMAL LOW (ref 3.5–5.1)
Sodium: 142 mmol/L (ref 135–145)
Sodium: 143 mmol/L (ref 135–145)
Sodium: 144 mmol/L (ref 135–145)

## 2020-05-15 LAB — MAGNESIUM: Magnesium: 2.1 mg/dL (ref 1.7–2.4)

## 2020-05-15 LAB — GLUCOSE, CAPILLARY
Glucose-Capillary: 111 mg/dL — ABNORMAL HIGH (ref 70–99)
Glucose-Capillary: 143 mg/dL — ABNORMAL HIGH (ref 70–99)
Glucose-Capillary: 167 mg/dL — ABNORMAL HIGH (ref 70–99)
Glucose-Capillary: 122 mg/dL — ABNORMAL HIGH (ref 70–99)
Glucose-Capillary: 113 mg/dL — ABNORMAL HIGH (ref 70–99)

## 2020-05-15 LAB — PHOSPHORUS: Phosphorus: 6.8 mg/dL — ABNORMAL HIGH (ref 2.5–4.6)

## 2020-05-15 LAB — TRIGLYCERIDES: Triglycerides: 486 mg/dL — ABNORMAL HIGH (ref <150)

## 2020-05-15 LAB — CBC
HCT: 27.3 % — ABNORMAL LOW (ref 39.0–52.0)
Hemoglobin: 9.1 g/dL — ABNORMAL LOW (ref 13.0–17.0)
MCH: 30.7 pg (ref 26.0–34.0)
MCHC: 33.3 g/dL (ref 30.0–36.0)
MCV: 92.2 fL (ref 80.0–100.0)
Platelets: 86 10*3/uL — ABNORMAL LOW (ref 150–400)
RBC: 2.96 MIL/uL — ABNORMAL LOW (ref 4.22–5.81)
RDW: 18.4 % — ABNORMAL HIGH (ref 11.5–15.5)
WBC: 3.6 10*3/uL — ABNORMAL LOW (ref 4.0–10.5)
nRBC: 0 % (ref 0.0–0.2)

## 2020-05-15 LAB — LACTIC ACID, PLASMA: Lactic Acid, Venous: 1.1 mmol/L (ref 0.5–1.9)

## 2020-05-15 LAB — URIC ACID: Uric Acid, Serum: 5 mg/dL (ref 3.7–8.6)

## 2020-05-15 LAB — SODIUM, URINE, RANDOM: Sodium, Ur: 47 mmol/L

## 2020-05-15 LAB — CREATININE, URINE, RANDOM: Creatinine, Urine: 42.4 mg/dL

## 2020-05-15 LAB — HEPARIN LEVEL (UNFRACTIONATED)
Heparin Unfractionated: 0.25 IU/mL — ABNORMAL LOW (ref 0.30–0.70)
Heparin Unfractionated: 0.36 IU/mL (ref 0.30–0.70)

## 2020-05-15 MED ORDER — SODIUM CHLORIDE 3 % IN NEBU
4.0000 mL | INHALATION_SOLUTION | Freq: Three times a day (TID) | RESPIRATORY_TRACT | Status: AC
  Administered 2020-05-15 – 2020-05-18 (×9): 4 mL via RESPIRATORY_TRACT
  Filled 2020-05-15 (×9): qty 4

## 2020-05-15 MED ORDER — SUCCINYLCHOLINE CHLORIDE 200 MG/10ML IV SOSY
PREFILLED_SYRINGE | INTRAVENOUS | Status: AC
  Administered 2020-05-15: 200 mg
  Filled 2020-05-15: qty 10

## 2020-05-15 MED ORDER — ORAL CARE MOUTH RINSE: 15.0000 mL | OROMUCOSAL | Status: DC

## 2020-05-15 MED ORDER — FENTANYL CITRATE (PF) 100 MCG/2ML IJ SOLN
INTRAMUSCULAR | Status: AC
  Administered 2020-05-15: 100 ug
  Filled 2020-05-15: qty 2

## 2020-05-15 MED ORDER — POLYETHYLENE GLYCOL 3350 17 G PO PACK
17.0000 g | PACK | Freq: Every day | ORAL | Status: DC
  Filled 2020-05-15: qty 1

## 2020-05-15 MED ORDER — ETOMIDATE 2 MG/ML IV SOLN
INTRAVENOUS | Status: AC
  Administered 2020-05-15: 20 mg
  Filled 2020-05-15: qty 20

## 2020-05-15 MED ORDER — SODIUM BICARBONATE 8.4 % IV SOLN
100.0000 meq | Freq: Once | INTRAVENOUS | Status: AC
  Administered 2020-05-15: 100 meq via INTRAVENOUS
  Filled 2020-05-15: qty 50

## 2020-05-15 MED ORDER — POTASSIUM CHLORIDE 10 MEQ/50ML IV SOLN
10.0000 meq | INTRAVENOUS | Status: AC
  Administered 2020-05-15 – 2020-05-16 (×2): 10 meq via INTRAVENOUS
  Filled 2020-05-15 (×2): qty 50

## 2020-05-15 MED ORDER — SODIUM CHLORIDE 0.9 % IV SOLN
2.0000 g | INTRAVENOUS | Status: DC
  Administered 2020-05-15 – 2020-05-16 (×2): 2 g via INTRAVENOUS
  Filled 2020-05-15: qty 20
  Filled 2020-05-15 (×2): qty 2

## 2020-05-15 MED ORDER — PROPOFOL 1000 MG/100ML IV EMUL
0.0000 ug/kg/min | INTRAVENOUS | Status: DC
  Administered 2020-05-16 (×2): 20 ug/kg/min via INTRAVENOUS
  Administered 2020-05-16: 30 ug/kg/min via INTRAVENOUS
  Administered 2020-05-16: 20 ug/kg/min via INTRAVENOUS
  Administered 2020-05-17 (×2): 30 ug/kg/min via INTRAVENOUS
  Filled 2020-05-15 (×7): qty 100

## 2020-05-15 MED ORDER — ROCURONIUM BROMIDE 10 MG/ML (PF) SYRINGE
PREFILLED_SYRINGE | INTRAVENOUS | Status: AC
  Filled 2020-05-15: qty 10

## 2020-05-15 MED ORDER — FENTANYL CITRATE (PF) 100 MCG/2ML IJ SOLN: 25.0000 ug | INTRAMUSCULAR | Status: DC | PRN

## 2020-05-15 MED ORDER — "THROMBI-PAD 3""X3"" EX PADS"
1.0000 | MEDICATED_PAD | Freq: Once | CUTANEOUS | Status: AC
  Administered 2020-05-15: 1 via TOPICAL
  Filled 2020-05-15: qty 1

## 2020-05-15 MED ORDER — LIDOCAINE HCL (CARDIAC) PF 100 MG/5ML IV SOSY
PREFILLED_SYRINGE | INTRAVENOUS | Status: AC
  Filled 2020-05-15: qty 5

## 2020-05-15 MED ORDER — STERILE WATER FOR INJECTION IJ SOLN
INTRAMUSCULAR | Status: AC
  Filled 2020-05-15: qty 10

## 2020-05-15 MED ORDER — MIDAZOLAM HCL 2 MG/2ML IJ SOLN
INTRAMUSCULAR | Status: AC
  Administered 2020-05-15: 2 mg
  Filled 2020-05-15: qty 4

## 2020-05-15 MED ORDER — DOCUSATE SODIUM 50 MG/5ML PO LIQD
100.0000 mg | Freq: Two times a day (BID) | ORAL | Status: DC
  Administered 2020-05-16: 100 mg
  Filled 2020-05-15 (×2): qty 10

## 2020-05-15 MED ORDER — VECURONIUM BROMIDE 10 MG IV SOLR
INTRAVENOUS | Status: AC
  Filled 2020-05-15: qty 10

## 2020-05-15 MED ORDER — CHLORHEXIDINE GLUCONATE 0.12% ORAL RINSE (MEDLINE KIT): 15.0000 mL | Freq: Two times a day (BID) | OROMUCOSAL | Status: DC

## 2020-05-15 MED ORDER — STERILE WATER FOR INJECTION IV SOLN
INTRAVENOUS | Status: DC
  Filled 2020-05-15: qty 150
  Filled 2020-05-15 (×2): qty 850
  Filled 2020-05-15 (×3): qty 150
  Filled 2020-05-15: qty 850

## 2020-05-15 MED ORDER — FENTANYL CITRATE (PF) 100 MCG/2ML IJ SOLN
25.0000 ug | INTRAMUSCULAR | Status: DC | PRN
  Administered 2020-05-15: 50 ug via INTRAVENOUS
  Administered 2020-05-16 (×2): 100 ug via INTRAVENOUS
  Filled 2020-05-15 (×4): qty 2

## 2020-05-15 NOTE — Procedures (Signed)
Intubation Procedure Note  Romulus Hanrahan  [id number]  1950-07-16  Date:05/15/20  Time:11:43 AM   Provider Performing:Aijah Lattner E Joran Kallal    Procedure: Intubation (31500)  Indication(s) Respiratory Failure  Consent Unable to obtain consent due to emergent nature of procedure.   Anesthesia Etomidate and Succinylcholine   Time Out Verified patient identification, verified procedure, site/side was marked, verified correct patient position, special equipment/implants available, medications/allergies/relevant history reviewed, required imaging and test results available.   Sterile Technique Usual hand hygeine, masks, and gloves were used   Procedure Description Patient positioned in bed supine.  Sedation given as noted above.  Patient was intubated with endotracheal tube using Glidescope.  View was Grade 1 full glottis .  Number of attempts was 1.  Colorimetric CO2 detector was consistent with tracheal placement. Anterior anatomy   Complications/Tolerance None; patient tolerated the procedure well. Chest X-ray is ordered to verify placement.   EBL Minimal   Specimen(s) None  Eliseo Gum MSN, AGACNP-BC [facility] [phone] 05/15/2020, 11:44 AM

## 2020-05-15 NOTE — Progress Notes (Signed)
Late entry: Required NT suctioning several times over a few hours and had high work of breathing despite bicarb supplements. Very limited reserve. Discussed reintubation briefly with Mr. Gange, which he agreed with. Reintubated successfully.   Julian Hy, DO 05/15/20 1:16 PM [facility]

## 2020-05-15 NOTE — Progress Notes (Signed)
eLink Physician-Brief Progress Note Patient Name: Dean Dixon DOB: 03-13-1951 MRN: [id number]   Date of Service  05/15/2020  HPI/Events of Note  Asked to follow up on midnight BMP, results now available On bicarbonate IV at 150 cc/hour, bicarb has corrected from 12 to 23 and K has dropped to 3.4 Creatinine at 3.3 Made 50 cc urine per hour  eICU Interventions  Will lower the bicarb rate and supplement with 20 meq IV Kcl Check ABG at 330 am and AM labs to be sent at the same time so we can decide further orders Asked RN to call us back when those result      Intervention Category Major Interventions: Acid-Base disturbance - evaluation and management  Margaretmary Lombard 05/15/2020, 11:35 PM

## 2020-05-15 NOTE — Progress Notes (Signed)
[place] for Heparin Indication: pulmonary embolus  Allergies  Allergen Reactions  . Ivp Dye [Iodinated Diagnostic Agents] Shortness Of Breath    "allergic to CT scan dye", took benadryl with last scan and tolerated well    Patient Measurements: Height: 5\' 4"  (162.6 cm) Weight: 104 kg (229 lb 4.5 oz) IBW/kg (Calculated) : 59.2 Heparin Dosing Weight: 81 kg  Vital Signs: Temp: 100 F (37.8 C) (11/23 1000) Temp Source: Core (11/23 0400) BP: 120/56 (11/23 1000) Pulse Rate: 110 (11/23 1318)  Labs: Recent Labs    05/13/20 0549 05/13/20 1040 05/13/20 1040 05/14/20 0434 05/14/20 1943 05/15/20 0230 05/15/20 1200  HGB  --  8.9*   < > 8.0*  --  9.1*  --   HCT  --  28.3*  --  24.9*  --  27.3*  --   PLT  --  67*  --  71*  --  86*  --   HEPARINUNFRC  --   --   --   --  0.12* 0.25* 0.36  CREATININE 2.93*  --   --  3.33*  --  3.35*  --    < > = values in this interval not displayed.    Estimated Creatinine Clearance: 22.4 mL/min (A) (by C-G formula based on SCr of 3.35 mg/dL (H)).   Medications:  Infusions:  . cefTRIAXone (ROCEPHIN)  IV Stopped (05/15/20 1229)  . feeding supplement (VITAL AF 1.2 CAL) Stopped (05/13/20 0815)  . heparin 1,200 Units/hr (05/15/20 1213)  . propofol (DIPRIVAN) infusion 5 mcg/kg/min (05/15/20 1225)  .  sodium bicarbonate (isotonic) infusion in sterile water 100 mL/hr at 05/15/20 1052    Assessment: Patient is a 69 y.o M with stage IV mantle cell lymphoma on chemotherapy (got CHOP on 04/21/20), presented to the ED on 11/7 with c/o SOB, groin swelling and emesis.  Chest CT showed acute PE.  Pharmacy is consulted to start heparin drip for VTE.  Significant Events: - Heparin stopped 11/14 PM for melenic stool, FOB + - GI consulted and found stool bag not melenic and is ok with resuming IV UFH.  Heparin resumed 11/15 after transfusion completed - Heparin stopped again 11/20 for bleeding - Heparin resumed 11/22-  do not bolus & target low end of goal range per CCM.   Today, 05/15/2020:  HL 0.36 now at goal  CBC: Hg 9.1/pltc 86 - low but improved   No bleeding or infusion related issues reported by RN  Goal of Therapy:  Heparin level 0.3-0.5 units/ml Monitor platelets by anticoagulation protocol: Yes   Plan:   No bolus  Continue heparin infusion at 1200 units/hr  Will not check confirmatory level due to low goal and desire to avoid excessive lab draws  Daily heparin level and CBC  Continue to monitor s/s bleeding.  Ulice Dash D  PharmD, BCPS 05/15/2020, 1:38 PM

## 2020-05-15 NOTE — Procedures (Signed)
Extubation Procedure Note  Patient Details:   Name: Draysen Weygandt DOB: [birth year] MRN: [id number]   Airway Documentation:  Airway 7.5 mm (Active)  Secured at (cm) 24 cm 05/15/20 0750  Measured From Lips 05/15/20 0750  Secured Location Left 05/15/20 0750  Secured By [employer] 05/15/20 0750  Tube Holder Repositioned Yes 05/15/20 0750  Prone position No 05/15/20 0750  Cuff Pressure (cm H2O) 24 cm H2O 05/14/20 1943  Site Condition Dry 05/15/20 0750   Vent end date: 05/15/20 Vent end time: 0859   Evaluation  O2 sats: stable throughout Complications: No apparent complications Patient did tolerate procedure well. Bilateral Breath Sounds: Clear   Yes   Patient extubated per physician order. Patient + Cuff leak prior to extubation. Currently on O2 via [place] @ 3 L w/ O2 sats of 97% and no stridor noted. RT will continue to monitor patient.   Lamonte Sakai 05/15/2020, 9:12 AM

## 2020-05-15 NOTE — Progress Notes (Signed)
NAME:  Dean Dixon, MRN:  [id number], DOB:  Nov 06, 1950, LOS: 68 ADMISSION DATE:  04/30/2020, CONSULTATION DATE:  11/7/201 REFERRING MD:  [facility], CHIEF COMPLAINT:  Respiratory distress  Brief History   69 year old male with stage IV mantle cell lymphoma on CHOP presenting with N/V and progressive SOB over the last 2 weeks found to have acute left segmental PE, intubated 11/7-11/9 in setting hypertensive emergency/flash, reintubated 11/20 for multifactorial acute hypercapnic hypoxic respiratory failure   History of present illness   HPI obtained from medical chart review as patient is currently intubated and sedated on mechanical ventilation.  69 year old male with prior medical history of stage IV mantle cell lymphoma (diagnosed 06/20/2019) on CHOP, hypertension, diabetes type 2 who presented to [facility] on 11/7 with progressive shortness of breath over the last 2 weeks, worse over the last 2 days with above baseline abdominal distention, non bloody nausea/ vomiting, nonradiating intermittent midsternal chest tightness, and cough with lightly productive greenish nonbloody sputum.  Additionally reported ongoing swelling in his legs and testicles causing difficulty with urination.  Recent hospitalization 10/28 - 11/1 found to have new left upper quadrant abdominal mass and hypercalcemia.    In ER, he was afebrile, hypertensive, mildly tachypneic, and tachycardic with oxygen saturations of 98% on room air.  Noted to have multiple episodes of vomiting and retching treated with zofran.  KUB with non-obstructive gas pattern.   Labs noted for glucose 290 with HgbA1c 6.3, BUN 40, AG 17, lactic acid 3.2-> 2.3, troponin hs 52, BNP 133, D-dimer 2.47, Hgb 11.5/ HCT 35.5, platelets 100, neg COVID/ flu, UA noted turbid urine with 5 ketones, negative for leukocytes/ nitrates.  CXR showed small bilateral pleural effusion with atelectasis and low lung volumes.  He was pretreated given his contrast allergy. CTA PE and  CT abd/ pelvis showed segmental nonocclusive left pulmonary embolism without associated hemorrhage or pulmonary infarct.  Additionally noted bulky soft tissue mass in LUQ with extensive lymphadenopathy in the chest and abd with evidence of widespread intraperitoneal metastatic disease, small bilateral pleural effusion, and  tracheobronchomalacia. Started on heparin gtt per pharmacy.   He was admitted by [facility] to progressive care.  In unit, he was noted to become hypertensive with BP 250/100 with complaints of chest discomfort with noted tachypnea and lethargy.  He was treated with hydralazine and placed on nitro gtt for blood pressure management.  Repeated EKG was non acute.  ABG on NRB noted 7.291/ 56.7/ 275.  Bipap was attempted however patient kept pulling it off and therefore given concern for impending respiratory failure, he was intubated by anesthesia.  PCCM consulted for further management.   Past Medical History  Mantle cell lymphoma on CHOP, DM, HTN  Significant Hospital Events   11/07 Admitted to [facility]/ decompensated/ intubated 11/08 Weaned on PSV, lasix with 2.2L UOP  11/09 Extubated  11/11 Fever / leukocytosis improved, on abx  11/12 Mental status improved, on RA, palliative radiation  11/20 Reintubated for acute hypercapnic hypoxic respiratory failure  Consults:  PCCM 11/7, 11/20  Procedures:  ETT 11/7 >> 11/9 TLC R IJ 11/8 >> 11/12 ETT 11/19 >> PICC  Significant Diagnostic Tests:  11/7 KUB >> nonobstructive gas pattern 11/7 CTA PE/  CT abd/ pelvis >> Study is positive for segmental sized pulmonary emboli in the left lung. These appear nonocclusive at this time, and there is no associated pulmonary hemorrhage to suggest frank pulmonary infarct. Bulky soft tissue mass in the left upper quadrant with extensive lymphadenopathy in  the chest and abdomen, and evidence of widespread intraperitoneal metastatic disease. Small bilateral pleural effusions lying dependently. Aortic  atherosclerosis.  Tracheobronchomalacia. Cholelithiasis. Colonic diverticulosis. 11/8 [place] Ascites/ABD >> small volume ascites, insufficient for paracentesis, extensive omental caking and peritoneal nodularity consistent with peritoneal carcinomatosis  11/8 ECHO >> cystic structure noted in liver on subcostal images, LVEF 60-65%, no RWMA, RV systolic function normal, pericardial effusion is posterior to LV 11/8 LE Doppler >> negative for DVT 11/19 CXR bilateral effusions, atelectasis  Micro Data:  SARS 2/ flu 11/7 >> neg MRSA 11/7 >> neg UA 11/7 >> negative, 5 ketones BCx2 11/9 >>  Trach aspirate 11/21>GPC> normal flora  Antimicrobials:  Previously on azithro/ ceftriaxone, stopped 10/30 Cefepime 11/8 >> 11/14  Interim history/subjective:  Improved volume of ETT secretions, no bleeding back on heparin. Denies complaints.  Objective   Blood pressure (!) 158/65, pulse (!) 110, temperature 99.7 F (37.6 C), resp. rate (!) 24, height 5\' 4"  (1.626 m), weight 104 kg, SpO2 96 %.    Vent Mode: PRVC FiO2 (%):  [30 %] 30 % Set Rate:  [20 bmp-22 bmp] 20 bmp Vt Set:  [470 mL] 470 mL PEEP:  [5 cmH20] 5 cmH20 Pressure Support:  [5 cmH20] 5 cmH20 Plateau Pressure:  [13 cmH20-21 cmH20] 13 cmH20   Intake/Output Summary (Last 24 hours) at 05/15/2020 1014 Last data filed at 05/15/2020 0851 Gross per 24 hour  Intake 1683.18 ml  Output 1200 ml  Net 483.18 ml   Filed Weights   05/13/20 0500 05/14/20 0450 05/15/20 0500  Weight: 100.8 kg 100.2 kg 104 kg   Physical Exam: General: Critically ill-appearing man lying in bed intubated, lightly sedated HEENT: [place]/AT, eyes anicteric, ET tube in place. Neuro: Nodding to answer questions, moving all extremities, globally weak CV: Tachycardic, regular rhythm PULM: Thick tan secretions, much less volume. GI: Soft, nontender, nondistended Extremities: Upper extremity edema, right upper extremity PICC, ecchymoses improving. Skin: no rashes,  pallor   Resolved Hospital Problem list    DKA Hypercalcemia TLS Massive hemoptysis  Assessment & Plan:   Acute hypoxic & hypercapnic respiratory failure: Likely related predominantly to extravascular volume overload with effusions and pulmonary edema. LUQ mass, ascites impairing respiratory mechanics and contributing to atelectasis with V/Q mismatch/shunt. His hemoptysis seems improved.  It doesn't appear that his PE burden is newly worse based on bedside ultrasound overnight, stable heart rate and blood pressure. Hemoptysis resolved. HAP- febrile overnight, remains leukopenic. -Con't heparin without bolus -LTVV, 4 to 8 cc/kg ideal body weight with goal plateau less than 30 and driving pressure less than 15.   -Daily SAT and SBT.  Passes SBT, extubated to [place]. Remains high risk for reintubation due to secretions. Requires ongoing ICU monitoring for respiratory status. -Deescalate antibiotics to ceftriaxone to complete 7 days. -NT suction PRN  Acute left segmental pulmonary embolism  In setting of known malignancy Has history of previous need for Coumadin about 20 years ago-unsure if this was an unprovoked VTE in the past -heparin infusion without boluses, con't monitoring for hemoptysis recurrence  AKI with metabolic acidosis -supplemental bicarb + infusion -con't to monitor  Hypernatremia-resolved -Continue free water flushes -Holding additional Lasix; 500 cc bolus plus albumin -Strict I's/O  Type 2 diabetes with hyperglycemia -SSI as needed -Continue Lantus 8 units daily. -Goal BG 140-180 while admitted to the ICU  Acute kidney injury- likely due to sepsis. Uric acid levels remain normal. Hypernatremia-resolved -Continue enteral free water -Continue to monitor renal function -Strict I's/O -Renally dose meds  and avoid nephrotoxic meds -urine sodium and Cr  Pancytopenia likely due to chemotherapy -con't heparin and close monitoring for recurrence of  bleeding -Transfuse for hemoglobin less than 7 or hemodynamically significant bleeding  Stage IV mantle cell lymphoma with LUQ mass/ extensive metastatic disease Last CHOP 10/30, follows with Dr. Irene Limbo -appreciate oncology input -palliative radiation starting today; unable to be started previously due to vent availability. -appreciate palliative care input > full scope care time.  Need to readdress goals of care with oncology.  Appreciate their assistance. -PT consult / mobilize when stable  History of tumor lysis syndrome-uric acid level stable -Continue to monitor -con't allopurinol  GoC -I discussed the patient's care with his friend and medical power of attorney Nicole Kindred and [place] wife.  He had been doing very well over the summer, tolerating a cruise in the [place].  Over the last month prior to admission he had deteriorating functional status, but lives alone and had been continuing to care for himself.  He is an " eternal optimist" and would want to continue aggressive therapy.  He would not want his final days in life to be spent on a ventilator.  They understand my concerns regarding prolonged admission and repeat hospital admission with background progressive cancer despite aggressive therapy.  They are aware that he would require significant rehabilitation prior to being discharged, but feel that he would want to continue aggressive care at this time.  We discussed the potential need for bronchoscopy if his hemoptysis worsens, which they verbally consented to.  They will be out of town for few days but plan to come back on Wednesday. -Power of attorney updated on 11/21 via phone   Best practice:  Diet: NPO Pain/Anxiety/Delirium protocol (if indicated): PAD protocol  VAP protocol (if indicated): Yes DVT prophylaxis: Systemic heparin on hold GI prophylaxis: PPI Glucose control: CBG q4h Mobility: BR Code Status: Full code Family Communication: will update  today disposition: ICU  Labs   CBC: Recent Labs  Lab 05/09/20 0427 05/09/20 0427 05/10/20 0500 05/10/20 0500 05/11/20 0723 05/11/20 2044 05/12/20 0116 05/12/20 1111 05/13/20 1040 05/14/20 0434 05/15/20 0230  WBC 3.1*   < > 3.1*   < > 2.7*  --  3.6* 3.1* 3.2* 2.5* 3.6*  NEUTROABS 2.4  --  2.4  --  2.0  --  2.8  --   --   --   --   HGB 10.6*   < > 10.8*   < > 10.2*   < > 10.7* 9.2* 8.9* 8.0* 9.1*  HCT 32.8*   < > 33.3*   < > 32.7*   < > 33.9* 29.1* 28.3* 24.9* 27.3*  MCV 92.1   < > 92.2   < > 94.5  --  95.0 94.2 94.6 94.7 92.2  PLT 39*   < > 45*   < > 51*  --  63* 60* 67* 71* 86*   < > = values in this interval not displayed.    Basic Metabolic Panel: Recent Labs  Lab 05/10/20 0500 05/10/20 0500 05/11/20 0723 05/12/20 0115 05/13/20 0549 05/14/20 0434 05/15/20 0230  NA 146*   < > 147* 143 144 139 142  K 3.2*   < > 4.2 4.9 3.9 4.0 3.8  CL 112*   < > 116* 111 115* 108 113*  CO2 22   < > 21* 22 20* 16* 12*  GLUCOSE 107*   < > 122* 189* 117* 101* 119*  BUN 46*   < >  48* 53* 82* 101* 112*  CREATININE 1.71*   < > 1.79* 1.83* 2.93* 3.33* 3.35*  CALCIUM 7.6*   < > 7.8* 7.9* 7.8* 7.7* 7.3*  MG 2.1  --  2.2  --  2.0 2.2 2.1  PHOS 2.5  --  3.0  --  4.5 6.2* 6.8*   < > = values in this interval not displayed.   GFR: Estimated Creatinine Clearance: 22.4 mL/min (A) (by C-G formula based on SCr of 3.35 mg/dL (H)). Recent Labs  Lab 05/12/20 0116 05/12/20 0116 05/12/20 1111 05/13/20 1040 05/14/20 0434 05/15/20 0230  WBC 3.6*   < > 3.1* 3.2* 2.5* 3.6*  LATICACIDVEN 1.0  --   --   --   --   --    < > = values in this interval not displayed.    Liver Function Tests: Recent Labs  Lab 05/09/20 0427 05/10/20 0500 05/11/20 0723 05/12/20 0115  AST 18 16 16 18   ALT 14 12 12 13   ALKPHOS 67 66 69 79  BILITOT 0.6 0.5 0.6 0.7  PROT 5.3* 5.2* 4.9* 5.4*  ALBUMIN 2.7* 2.7* 2.6* 2.9*   No results for input(s): LIPASE, AMYLASE in the last 168 hours. No results for input(s):  AMMONIA in the last 168 hours.  ABG    Component Value Date/Time   PHART 7.146 (LL) 05/12/2020 0112   PCO2ART 68.3 (HH) 05/12/2020 0112   PO2ART 93.0 05/12/2020 0112   HCO3 21.8 05/12/2020 0750   TCO2 29 05/17/2020 2146   ACIDBASEDEF 4.0 (H) 05/12/2020 0750   O2SAT 79.2 05/12/2020 0750     Coagulation Profile: No results for input(s): INR, PROTIME in the last 168 hours.  Cardiac Enzymes: No results for input(s): CKTOTAL, CKMB, CKMBINDEX, TROPONINI in the last 168 hours.  HbA1C: Hgb A1c MFr Bld  Date/Time Value Ref Range Status  04/27/2020 05:49 PM 6.3 (H) 4.8 - 5.6 % Final    Comment:    (NOTE) Pre diabetes:          5.7%-6.4%  Diabetes:              >6.4%  Glycemic control for   <7.0% adults with diabetes   11/14/2019 06:13 AM 5.4 4.8 - 5.6 % Final    Comment:    (NOTE) Pre diabetes:          5.7%-6.4% Diabetes:              >6.4% Glycemic control for   <7.0% adults with diabetes     CBG: Recent Labs  Lab 05/14/20 1551 05/14/20 1954 05/14/20 2348 05/15/20 0413 05/15/20 0821  GLUCAP 146* 147* 152* 111* 143*    This patient is critically ill with multiple organ system failure which requires frequent high complexity decision making, assessment, support, evaluation, and titration of therapies. This was completed through the application of advanced monitoring technologies and extensive interpretation of multiple databases. During this encounter critical care time was devoted to patient care services described in this note for 41 minutes.  Julian Hy, DO 05/15/20 10:32 AM [facility]

## 2020-05-15 NOTE — Progress Notes (Signed)
Patient continues to tolerated SBT 5/+5. Patient acknowledges he is comfortable at this time.

## 2020-05-15 NOTE — Plan of Care (Signed)
I updated Dean Dixon's friend Dean Dixon, the wife of his mPOA. We discussed his extubation and need for reintubation today. I expressed my concern regarding his deteriorating renal function and likely need for a decision soon regarding if he would be ok escalating care further given his ongoing need for MV that will likely be at least several more days and his underlying malignancy that has progressed despite chemo. I related that I had discussed his care with Dr. Irene Limbo earlier that he still has treatment options, but he has a lymphoma that is resistant to some treatments and he will need to have enough functional status to be a candidate for treatment. My concern with his critical illness becoming more prolonged is that he may not be functional to be a candidate for chemo for weeks to months, and this should be a consideration given that his malignancy will progress during this time frame. She will discuss with her husband Dean Dixon, the [place] that this decision will likely need to be made tomorrow. They are coming to visit tomorrow and will both be present for the goals of care discussion.  Julian Hy, DO 05/15/20 6:02 PM [facility]

## 2020-05-15 NOTE — Progress Notes (Signed)
[place] for Heparin Indication: pulmonary embolus  Allergies  Allergen Reactions  . Ivp Dye [Iodinated Diagnostic Agents] Shortness Of Breath    "allergic to CT scan dye", took benadryl with last scan and tolerated well    Patient Measurements: Height: 5\' 4"  (162.6 cm) Weight: 100.2 kg (220 lb 14.4 oz) IBW/kg (Calculated) : 59.2 Heparin Dosing Weight: 81 kg  Vital Signs: Temp: 100 F (37.8 C) (11/23 0200) Temp Source: Core (11/23 0000) BP: 139/70 (11/23 0200) Pulse Rate: 107 (11/23 0200)  Labs: Recent Labs    05/12/20 1111 05/13/20 0549 05/13/20 1040 05/13/20 1040 05/14/20 0434 05/14/20 1943 05/15/20 0230  HGB   < >  --  8.9*   < > 8.0*  --  9.1*  HCT   < >  --  28.3*  --  24.9*  --  27.3*  PLT   < >  --  67*  --  71*  --  86*  HEPARINUNFRC  --   --   --   --   --  0.12* 0.25*  CREATININE  --  2.93*  --   --  3.33*  --   --    < > = values in this interval not displayed.    Estimated Creatinine Clearance: 22.1 mL/min (A) (by C-G formula based on SCr of 3.33 mg/dL (H)).   Medications:  Infusions:  . ceFEPime (MAXIPIME) IV Stopped (05/14/20 1438)  . feeding supplement (VITAL AF 1.2 CAL) Stopped (05/13/20 0815)  . heparin 1,100 Units/hr (05/15/20 0200)  . propofol (DIPRIVAN) infusion 30 mcg/kg/min (05/15/20 0200)  . vancomycin Stopped (05/14/20 2213)    Assessment: Patient is a 69 y.o M with stage IV mantle cell lymphoma on chemotherapy (got CHOP on 04/21/20), presented to the ED on 11/7 with c/o SOB, groin swelling and emesis.  Chest CT showed acute PE.  Pharmacy is consulted to start heparin drip for VTE.  Significant Events: - Heparin stopped 11/14 PM for melenic stool, FOB + - GI consulted and found stool bag not melenic and is ok with resuming IV UFH.  Heparin resumed 11/15 after transfusion completed - Heparin stopped again 11/20 for bleeding - Heparin resumed 11/22- do not bolus & target low end of goal range per  CCM.   Today, 05/15/2020:  Heparin level 0.25; sub-therapeutic on 1100 units/hr.  Level drawn 6h after rate change.   CBC: Hg 9.1/pltc 86 - low but improved   No bleeding or infusion related issues reported by RN  Goal of Therapy:  Heparin level 0.3-0.5 units/ml Monitor platelets by anticoagulation protocol: Yes   Plan:   No bolus  Increase heparin infusion to 1200 units/hr  Recheck heparin level 8 hours after rate increase  Daily heparin level and CBC  Continue to monitor s/s bleeding.  Netta Cedars  PharmD, BCPS 05/15/2020, 3:09 AM

## 2020-05-15 NOTE — Progress Notes (Signed)
Patient transported to radiation without complication.

## 2020-05-16 ENCOUNTER — Ambulatory Visit: Payer: Medicare Other

## 2020-05-16 ENCOUNTER — Ambulatory Visit
Admit: 2020-05-16 | Discharge: 2020-05-16 | Disposition: A | Discharge status: Home / Self Care | Payer: Medicare Other | Attending: Radiation Oncology | Admitting: Radiation Oncology

## 2020-05-16 DIAGNOSIS — C8318 Mantle cell lymphoma, lymph nodes of multiple sites: Secondary | ICD-10-CM | POA: Diagnosis not present

## 2020-05-16 LAB — GLUCOSE, CAPILLARY
Glucose-Capillary: 113 mg/dL — ABNORMAL HIGH (ref 70–99)
Glucose-Capillary: 119 mg/dL — ABNORMAL HIGH (ref 70–99)
Glucose-Capillary: 129 mg/dL — ABNORMAL HIGH (ref 70–99)
Glucose-Capillary: 138 mg/dL — ABNORMAL HIGH (ref 70–99)
Glucose-Capillary: 138 mg/dL — ABNORMAL HIGH (ref 70–99)
Glucose-Capillary: 140 mg/dL — ABNORMAL HIGH (ref 70–99)
Glucose-Capillary: 86 mg/dL (ref 70–99)

## 2020-05-16 LAB — MAGNESIUM: Magnesium: 1.9 mg/dL (ref 1.7–2.4)

## 2020-05-16 LAB — BASIC METABOLIC PANEL
Anion gap: 18 — ABNORMAL HIGH (ref 5–15)
Anion gap: 20 — ABNORMAL HIGH (ref 5–15)
BUN: 140 mg/dL — ABNORMAL HIGH (ref 8–23)
BUN: 146 mg/dL — ABNORMAL HIGH (ref 8–23)
CO2: 25 mmol/L (ref 22–32)
CO2: 19 mmol/L — ABNORMAL LOW (ref 22–32)
Calcium: 6.5 mg/dL — ABNORMAL LOW (ref 8.9–10.3)
Calcium: 6.9 mg/dL — ABNORMAL LOW (ref 8.9–10.3)
Chloride: 99 mmol/L (ref 98–111)
Chloride: 105 mmol/L (ref 98–111)
Creatinine, Ser: 3.31 mg/dL — ABNORMAL HIGH (ref 0.61–1.24)
Creatinine, Ser: 3.61 mg/dL — ABNORMAL HIGH (ref 0.61–1.24)
GFR, Estimated: 19 mL/min — ABNORMAL LOW (ref >60)
GFR, Estimated: 17 mL/min — ABNORMAL LOW (ref >60)
Glucose, Bld: 130 mg/dL — ABNORMAL HIGH (ref 70–99)
Glucose, Bld: 132 mg/dL — ABNORMAL HIGH (ref 70–99)
Potassium: 3.5 mmol/L (ref 3.5–5.1)
Potassium: 3.5 mmol/L (ref 3.5–5.1)
Sodium: 142 mmol/L (ref 135–145)
Sodium: 144 mmol/L (ref 135–145)

## 2020-05-16 LAB — BLOOD GAS, ARTERIAL
Acid-base deficit: 5.8 mmol/L — ABNORMAL HIGH (ref 0.0–2.0)
Bicarbonate: 18.2 mmol/L — ABNORMAL LOW (ref 20.0–28.0)
FIO2: 30
O2 Saturation: 95.8 %
Patient temperature: 98.5
pCO2 arterial: 32 mmHg (ref 32.0–48.0)
pH, Arterial: 7.374 (ref 7.350–7.450)
pO2, Arterial: 79.1 mmHg — ABNORMAL LOW (ref 83.0–108.0)

## 2020-05-16 LAB — URIC ACID: Uric Acid, Serum: 5.5 mg/dL (ref 3.7–8.6)

## 2020-05-16 LAB — CBC
HCT: 21.4 % — ABNORMAL LOW (ref 39.0–52.0)
Hemoglobin: 7.2 g/dL — ABNORMAL LOW (ref 13.0–17.0)
MCH: 30.1 pg (ref 26.0–34.0)
MCHC: 33.6 g/dL (ref 30.0–36.0)
MCV: 89.5 fL (ref 80.0–100.0)
Platelets: 77 10*3/uL — ABNORMAL LOW (ref 150–400)
RBC: 2.39 MIL/uL — ABNORMAL LOW (ref 4.22–5.81)
RDW: 18.3 % — ABNORMAL HIGH (ref 11.5–15.5)
WBC: 3.4 10*3/uL — ABNORMAL LOW (ref 4.0–10.5)
nRBC: 0 % (ref 0.0–0.2)

## 2020-05-16 LAB — PHOSPHORUS: Phosphorus: 7.4 mg/dL — ABNORMAL HIGH (ref 2.5–4.6)

## 2020-05-16 LAB — TRIGLYCERIDES: Triglycerides: 495 mg/dL — ABNORMAL HIGH (ref <150)

## 2020-05-16 MED ORDER — VITAL HIGH PROTEIN PO LIQD
1000.0000 mL | ORAL | Status: DC
  Administered 2020-05-16: 1000 mL

## 2020-05-16 MED ORDER — VITAL HIGH PROTEIN PO LIQD
1000.0000 mL | ORAL | Status: DC
  Administered 2020-05-17: 1000 mL

## 2020-05-16 MED ORDER — POTASSIUM CHLORIDE 10 MEQ/50ML IV SOLN
10.0000 meq | INTRAVENOUS | Status: AC
  Administered 2020-05-16 (×2): 10 meq via INTRAVENOUS
  Filled 2020-05-16 (×2): qty 50

## 2020-05-16 MED ORDER — PROSOURCE TF PO LIQD
45.0000 mL | Freq: Two times a day (BID) | ORAL | Status: DC
  Administered 2020-05-16 – 2020-05-18 (×5): 45 mL
  Filled 2020-05-16 (×6): qty 45

## 2020-05-16 NOTE — Progress Notes (Signed)
eLink Physician-Brief Progress Note Patient Name: Rodolphe Edmonston DOB: Jun 13, 1951 MRN: [id number]   Date of Service  05/16/2020  HPI/Events of Note  Frequent stools, request for Flexiseal Ongoing bicarb drip  eICU Interventions  Fecal management system ordered Relay labs once available esp K     Intervention Category Minor Interventions: Routine modifications to care plan (e.g. PRN medications for pain, fever)  Shona Needles Janita Camberos 05/16/2020, 1:43 AM

## 2020-05-16 NOTE — Progress Notes (Signed)
Oncology Short note   Patients condition was discussed with Dr Carlis Abbott. He remains critically ill. ICU team concern with pneumonia and worsening renal insufficiency.DIscussed that his blood count are bouncing back from CHOP and that he would be started on Acalabrutinib if/when stabilized from infection standpoint. Discussed that his PS and other medical complication are limiting to treatment of his MCL at this time. ICU continues goals of care discussed with patients HCPOA. I have discussed the situation previously with HCPOA from an oncology standpoint as well. We will continue to f/u as needed.  Sullivan Lone MD MS

## 2020-05-16 NOTE — Progress Notes (Signed)
NAME:  Dean Dixon, MRN:  [id number], DOB:  1951-03-19, LOS: 61 ADMISSION DATE:  05/20/2020, CONSULTATION DATE:  11/7/201 REFERRING MD:  [facility], CHIEF COMPLAINT:  Respiratory distress  Brief History   69 year old male with stage IV mantle cell lymphoma on CHOP presenting with N/V and progressive SOB over the last 2 weeks found to have acute left segmental PE, intubated 11/7-11/9 in setting hypertensive emergency/flash, reintubated 11/20 for multifactorial acute hypercapnic hypoxic respiratory failure   History of present illness   HPI obtained from medical chart review as patient is currently intubated and sedated on mechanical ventilation.  69 year old male with prior medical history of stage IV mantle cell lymphoma (diagnosed 06/20/2019) on CHOP, hypertension, diabetes type 2 who presented to [facility] on 11/7 with progressive shortness of breath over the last 2 weeks, worse over the last 2 days with above baseline abdominal distention, non bloody nausea/ vomiting, nonradiating intermittent midsternal chest tightness, and cough with lightly productive greenish nonbloody sputum.  Additionally reported ongoing swelling in his legs and testicles causing difficulty with urination.  Recent hospitalization 10/28 - 11/1 found to have new left upper quadrant abdominal mass and hypercalcemia.    In ER, he was afebrile, hypertensive, mildly tachypneic, and tachycardic with oxygen saturations of 98% on room air.  Noted to have multiple episodes of vomiting and retching treated with zofran.  KUB with non-obstructive gas pattern.   Labs noted for glucose 290 with HgbA1c 6.3, BUN 40, AG 17, lactic acid 3.2-> 2.3, troponin hs 52, BNP 133, D-dimer 2.47, Hgb 11.5/ HCT 35.5, platelets 100, neg COVID/ flu, UA noted turbid urine with 5 ketones, negative for leukocytes/ nitrates.  CXR showed small bilateral pleural effusion with atelectasis and low lung volumes.  He was pretreated given his contrast allergy. CTA PE and  CT abd/ pelvis showed segmental nonocclusive left pulmonary embolism without associated hemorrhage or pulmonary infarct.  Additionally noted bulky soft tissue mass in LUQ with extensive lymphadenopathy in the chest and abd with evidence of widespread intraperitoneal metastatic disease, small bilateral pleural effusion, and  tracheobronchomalacia. Started on heparin gtt per pharmacy.   He was admitted by [facility] to progressive care.  In unit, he was noted to become hypertensive with BP 250/100 with complaints of chest discomfort with noted tachypnea and lethargy.  He was treated with hydralazine and placed on nitro gtt for blood pressure management.  Repeated EKG was non acute.  ABG on NRB noted 7.291/ 56.7/ 275.  Bipap was attempted however patient kept pulling it off and therefore given concern for impending respiratory failure, he was intubated by anesthesia.  PCCM consulted for further management.   Past Medical History  Mantle cell lymphoma on CHOP, DM, HTN  Significant Hospital Events   11/07 Admitted to [facility]/ decompensated/ intubated 11/08 Weaned on PSV, lasix with 2.2L UOP  11/09 Extubated  11/11 Fever / leukocytosis improved, on abx  11/12 Mental status improved, on RA, palliative radiation  11/20 Reintubated for acute hypercapnic hypoxic respiratory failure  Consults:  PCCM 11/7, 11/20  Procedures:  ETT 11/7 >> 11/9 TLC R IJ 11/8 >> 11/12 ETT 11/19 >>11/23, 11/23>> PICC  Significant Diagnostic Tests:  11/7 KUB >> nonobstructive gas pattern 11/7 CTA PE/  CT abd/ pelvis >> Study is positive for segmental sized pulmonary emboli in the left lung. These appear nonocclusive at this time, and there is no associated pulmonary hemorrhage to suggest frank pulmonary infarct. Bulky soft tissue mass in the left upper quadrant with extensive lymphadenopathy  in the chest and abdomen, and evidence of widespread intraperitoneal metastatic disease. Small bilateral pleural effusions lying dependently.  Aortic atherosclerosis.  Tracheobronchomalacia. Cholelithiasis. Colonic diverticulosis. 11/8 [place] Ascites/ABD >> small volume ascites, insufficient for paracentesis, extensive omental caking and peritoneal nodularity consistent with peritoneal carcinomatosis  11/8 ECHO >> cystic structure noted in liver on subcostal images, LVEF 60-65%, no RWMA, RV systolic function normal, pericardial effusion is posterior to LV 11/8 LE Doppler >> negative for DVT 11/19 CXR bilateral effusions, atelectasis  Micro Data:  SARS 2/ flu 11/7 >> neg MRSA 11/7 >> neg UA 11/7 >> negative, 5 ketones BCx2 11/9 >>  Trach aspirate 11/21>GPC> normal flora  Antimicrobials:  Previously on azithro/ ceftriaxone, stopped 10/30 Cefepime 11/8 >> 11/14 Ceftriaxone 11/23>  Interim history/subjective:  Failed extubation, reintubated within hours due to weakness, inability to clear secretions.  Had bleeding in his throat yesterday afternoon> heparin stopped again.  Objective   Blood pressure (!) 112/50, pulse 100, temperature 99.3 F (37.4 C), resp. rate (!) 23, height 5\' 4"  (1.626 m), weight 100.7 kg, SpO2 96 %.    Vent Mode: PRVC FiO2 (%):  [30 %-40 %] 30 % Set Rate:  [22 bmp] 22 bmp Vt Set:  [470 mL] 470 mL PEEP:  [5 cmH20] 5 cmH20 Plateau Pressure:  [19 cmH20-24 cmH20] 21 cmH20   Intake/Output Summary (Last 24 hours) at 05/16/2020 0841 Last data filed at 05/16/2020 4268 Gross per 24 hour  Intake 2913.51 ml  Output 1400 ml  Net 1513.51 ml   Filed Weights   05/14/20 0450 05/15/20 0500 05/16/20 0428  Weight: 100.2 kg 104 kg 100.7 kg   Physical Exam: General: Critically ill-appearing man lying in bed no acute distress, intubated and sedated HEENT: [place]/AT, eyes anicteric, endotracheal tube in place Neuro: RASS -2, following some commands.  Tracks with his eyes CV: Tachycardic, regular rhythm PULM: Less tracheal secretions today.  Rhonchi bilaterally.  Breathing synchronously with the vent. GI: Obese, soft,  nontender Extremities: PICC line, persistent edema Skin: No rashes, ecchymoses improving   Resolved Hospital Problem list   DKA Hypercalcemia TLS Massive hemoptysis  Assessment & Plan:   Acute hypoxic & hypercapnic respiratory failure: Likely related predominantly to extravascular volume overload with effusions and pulmonary edema. LUQ mass, ascites impairing respiratory mechanics and contributing to atelectasis with V/Q mismatch/shunt. His hemoptysis seems improved.  It doesn't appear that his PE burden is newly worse based on bedside ultrasound overnight, stable heart rate and blood pressure. Hemoptysis resolved. HAP- febrile overnight, remains leukopenic. --Continue low tidal volume ventilation, 4 to 8 cc/kg ideal body weight with goal plateau less than 30 and driving pressure less than 15.  Meeting goals. -Daily SAT and SBT when appropriate. -Continue antibiotics to complete 7 days of therapy  Acute left segmental pulmonary embolism  In setting of known malignancy Has history of previous need for Coumadin about 20 years ago-unsure if this was an unprovoked VTE in the past -heparin infusion on hold due to upper airway bleeding and drop in hemoglobin  AKI with metabolic acidosis- likely attributable to renal failure. Metabolic acidosis improved with bicarb infusion. Uric acid slowly rising. Renal function stable. -Continue bicarb infusion, will hopefully be able to discontinue later today.  May require enteral bicarb to continue. -Goals of care discussion today to discuss if dialysis would be consistent with his stage of life and goals.  Hypernatremia-resolved -Continue free water flushes -holding lasix for now -Strict I's/O  Type 2 diabetes with hyperglycemia -SSI as needed -Continue Lantus  8 units daily. -Goal BG 140-180 while admitted to the ICU  Acute kidney injury- likely due to sepsis. Uric acid levels remain normal. FeNa 2.5 likely ATN from  sepsis. Hypernatremia-resolved -Continue enteral free water -Continue to monitor renal function> repeat BMP this afternoon -Strict I's/O -Renally dose meds and avoid nephrotoxic meds -urine sodium and Cr  Pancytopenia likely due to chemotherapy -Continue to hold heparin -Transfuse for hemoglobin less than 7 or hemodynamically significant bleeding  Stage IV mantle cell lymphoma with LUQ mass/ extensive metastatic disease Last CHOP 10/30, follows with Dr. Irene Limbo -appreciate oncology's input -palliative radiation -PT consult / mobilize when stable  History of tumor lysis syndrome-uric acid level slowly increasing -Continue to monitor -con't allopurinol  GoC -I discussed the patient's care with his friend and medical power of attorney Nicole Kindred and [place] wife.  He had been doing very well over the summer, tolerating a cruise in the [place].  Over the last month prior to admission he had deteriorating functional status, but lives alone and had been continuing to care for himself.  He is an " eternal optimist" and would want to continue aggressive therapy.  He would not want his final days in life to be spent on a ventilator.  They understand my concerns regarding prolonged admission and repeat hospital admission with background progressive cancer despite aggressive therapy.  They are aware that he would require significant rehabilitation prior to being discharged, but feel that he would want to continue aggressive care at this time.  We discussed the potential need for bronchoscopy if his hemoptysis worsens, which they verbally consented to.  They will be out of town for few days but plan to come back on Wednesday. -Power of attorney updated on 11/21 via phone -Updated power of attorney's wife on 11/24.  Planning goals of care meeting in person with Nicole Kindred and his wife today.  Best practice:  Diet: NPO Pain/Anxiety/Delirium protocol (if indicated): PAD protocol  VAP protocol (if  indicated): Yes DVT prophylaxis: Systemic heparin on hold GI prophylaxis: PPI Glucose control: CBG q4h Mobility: BR Code Status: Full code Family Communication: will update today disposition: ICU  Labs   CBC: Recent Labs  Lab 05/10/20 0500 05/10/20 0500 05/11/20 0723 05/11/20 2044 05/12/20 0116 05/12/20 0116 05/12/20 1111 05/13/20 1040 05/14/20 0434 05/15/20 0230 05/16/20 0317  WBC 3.1*   < > 2.7*  --  3.6*   < > 3.1* 3.2* 2.5* 3.6* 3.4*  NEUTROABS 2.4  --  2.0  --  2.8  --   --   --   --   --   --   HGB 10.8*   < > 10.2*   < > 10.7*   < > 9.2* 8.9* 8.0* 9.1* 7.2*  HCT 33.3*   < > 32.7*   < > 33.9*   < > 29.1* 28.3* 24.9* 27.3* 21.4*  MCV 92.2   < > 94.5  --  95.0   < > 94.2 94.6 94.7 92.2 89.5  PLT 45*   < > 51*  --  63*   < > 60* 67* 71* 86* 77*   < > = values in this interval not displayed.    Basic Metabolic Panel: Recent Labs  Lab 05/11/20 0723 05/12/20 0115 05/13/20 0549 05/13/20 0549 05/14/20 0434 05/15/20 0230 05/15/20 1457 05/15/20 2200 05/16/20 0317  NA 147*   < > 144   < > 139 142 143 144 142  K 4.2   < > 3.9   < >  4.0 3.8 4.1 3.4* 3.5  CL 116*   < > 115*   < > 108 113* 109 103 99  CO2 21*   < > 20*   < > 16* 12* 18* 23 25  GLUCOSE 122*   < > 117*   < > 101* 119* 146* 132* 130*  BUN 48*   < > 82*   < > 101* 112* 134* 133* 140*  CREATININE 1.79*   < > 2.93*   < > 3.33* 3.35* 3.13* 3.30* 3.31*  CALCIUM 7.8*   < > 7.8*   < > 7.7* 7.3* 7.3* 6.5* 6.5*  MG 2.2  --  2.0  --  2.2 2.1  --   --  1.9  PHOS 3.0  --  4.5  --  6.2* 6.8*  --   --  7.4*   < > = values in this interval not displayed.   GFR: Estimated Creatinine Clearance: 22.3 mL/min (A) (by C-G formula based on SCr of 3.31 mg/dL (H)). Recent Labs  Lab 05/12/20 0116 05/12/20 1111 05/13/20 1040 05/14/20 0434 05/15/20 0230 05/15/20 1457 05/16/20 0317  WBC 3.6*   < > 3.2* 2.5* 3.6*  --  3.4*  LATICACIDVEN 1.0  --   --   --   --  1.1  --    < > = values in this interval not displayed.     Liver Function Tests: Recent Labs  Lab 05/10/20 0500 05/11/20 0723 05/12/20 0115  AST 16 16 18   ALT 12 12 13   ALKPHOS 66 69 79  BILITOT 0.5 0.6 0.7  PROT 5.2* 4.9* 5.4*  ALBUMIN 2.7* 2.6* 2.9*   No results for input(s): LIPASE, AMYLASE in the last 168 hours. No results for input(s): AMMONIA in the last 168 hours.  ABG    Component Value Date/Time   PHART 7.374 05/16/2020 0330   PCO2ART 32.0 05/16/2020 0330   PO2ART 79.1 (L) 05/16/2020 0330   HCO3 18.2 (L) 05/16/2020 0330   TCO2 29 05/20/2020 2146   ACIDBASEDEF 5.8 (H) 05/16/2020 0330   O2SAT 95.8 05/16/2020 0330     Coagulation Profile: No results for input(s): INR, PROTIME in the last 168 hours.  Cardiac Enzymes: No results for input(s): CKTOTAL, CKMB, CKMBINDEX, TROPONINI in the last 168 hours.  HbA1C: Hgb A1c MFr Bld  Date/Time Value Ref Range Status  05/11/2020 05:49 PM 6.3 (H) 4.8 - 5.6 % Final    Comment:    (NOTE) Pre diabetes:          5.7%-6.4%  Diabetes:              >6.4%  Glycemic control for   <7.0% adults with diabetes   11/14/2019 06:13 AM 5.4 4.8 - 5.6 % Final    Comment:    (NOTE) Pre diabetes:          5.7%-6.4% Diabetes:              >6.4% Glycemic control for   <7.0% adults with diabetes     CBG: Recent Labs  Lab 05/15/20 1200 05/15/20 1702 05/15/20 1927 05/16/20 0010 05/16/20 0724  GLUCAP 167* 122* 113* 138* 86    This patient is critically ill with multiple organ system failure which requires frequent high complexity decision making, assessment, support, evaluation, and titration of therapies. This was completed through the application of advanced monitoring technologies and extensive interpretation of multiple databases. During this encounter critical care time was devoted to patient care services described in  this note for 41 minutes.  Julian Hy, DO 05/16/20 11:39 AM [facility]

## 2020-05-16 NOTE — Progress Notes (Signed)
eLink Physician-Brief Progress Note Patient Name: Alta Shober DOB: 03-Apr-1951 MRN: [id number]   Date of Service  05/16/2020  HPI/Events of Note  Notified of ABG and K result Acidosis improved despite decrease in bicarb drip K 3.5, creatinine 3.3  eICU Interventions  Reordered another 20 meqs K     Intervention Category Minor Interventions: Electrolytes abnormality - evaluation and management  Judd Lien 05/16/2020, 4:12 AM

## 2020-05-16 NOTE — Plan of Care (Signed)
Mr. Spanier's mPOA Nicole Kindred and his wife arrived while he was being transferred to radiation. His nurse was unfortunately not able to be present for most of the goals of care discussion, but was updated upon return. Palliative Care was also present and was meeting with them when he arrived back from Radiation. They understand the concern with his progressive renal failure, more prolonged course of MV, pneumonia, and progressive malignancy. They agree that they do not think that escalating his care to include RRT would benefit him long-term.  They have previously expressed to me that he would not want to spend his final days on MV. We discussed the natural progression of renal failure with azotemia is he will develop progressive confusion and encephalopathy. At some point he will not wake up. If we were to proceed with a terminal extubation at some point, we will ensure that he is comfortable.  The plan is for continuing current care and reassessing his overall status and renal function tomorrow.   Julian Hy, DO 05/16/20 2:34 PM [facility]

## 2020-05-16 NOTE — Progress Notes (Signed)
Nutrition Follow-up  RD working remotely.  DOCUMENTATION CODES:   Obesity unspecified  INTERVENTION:  - will continue Vital High Protein @ 20 ml/hr with 45 ml Prosource TF BID and 200 ml free water every 4 hours.  - CCM to increase TF 11/25, if increase feels appropriate at that time.  - goal rate for TF regimen (without the addition of kcal from propofol): Vital High Protein @ 50 ml/hr with 45 ml Prosource TF BID to provide 1280 kcal, 127 grams protein, and 1003 ml free water.    NUTRITION DIAGNOSIS:   Increased nutrient needs related to chronic illness, cancer and cancer related treatments, acute illness as evidenced by estimated needs. -ongoing  GOAL:   Patient will meet greater than or equal to 90% of their needs -to be met with TF regimen  MONITOR:   PO intake, Supplement acceptance, Labs, Weight trends  REASON FOR ASSESSMENT:   Ventilator, Consult Enteral/tube feeding initiation and management  ASSESSMENT:   Pt with stage IV mantle cell lymphoma on CHOP presented with N/V and dyspnea x2 weeks found to have acute left segmental PE. PMH also includes DM and HTN.  Significant Events: 11/7- admission; intubated OGT placement 11/8- initial RD assessment 11/9- extubated; OGT removed 11/12- palliative XRT start 11/14- diet advanced to CLD  11/15- diet advanced to Dysphagia 1, honey-thick 11/16- diet advanced to Dysphagia 2, nectar-thick 11/20- re-intubated; OGT placement; TF initiation 11/23- extubated and subsequently re-intubated; OGT placement   He was re-intubated late morning yesterday. Abdominal xray report from yesterday states that tip of OGT is in the gastic antrum/pyloric region.   Order currently in place for Vital High Protein @ 40 ml/hr with 45 ml Prosource TF BID and 200 ml free water every 4 hours. This regimen is providing 560 kcal, 64 grams protein, and 1601 ml free water.  Weight has been stable 11/21-11/24, but is up from previous weights. Moderate  pitting edema to all extremities and perineal area documented in the flow sheet.   Per notes: - extravascular volume overload with effusions and pulmonary edema, ascites - AKI with plan for GOC discussion concerning dialysis initiation - hypernatremia--resolved - stage 4 mantle cell lymphoma with LUQ mass and extensive metastatic disease - plan for GOC discussion today (11/24)   Patient is currently intubated on ventilator support MV: 12.1 L/min Temp (24hrs), Avg:100.1 F (37.8 C), Min:99.1 F (37.3 C), Max:101.3 F (38.5 C) Propofol: 12.48 ml/hr (329 kcal)   Labs reviewed; CBGs: 138, 86, 138 mg/dl, BUN: 140 mg/dl, creatinine: 3.31 mg/dl, Ca: 6.5 mg/dl, Phos: 7.4 mg/dl, GFR: 19 ml/min.  Medications reviewed; 100 mg colace BID, sliding scale novolog, 15 ml multivitamin/day, 17 g miralax/day, 10 mEq IV KCl x2 runs 11/24. IVF; 150 mEq sodium bicarb in sterile water @ 100 ml/hr.    Diet Order:   Diet Order            Diet NPO time specified  Diet effective now                 EDUCATION NEEDS:   No education needs have been identified at this time  Skin:  Skin Assessment: Skin Integrity Issues: Skin Integrity Issues:: DTI, Other (Comment) DTI: coccyx Stage II: N/A Other: MASD to bilateral buttocks  Last BM:  11/24  Height:   Ht Readings from Last 1 Encounters:  05/14/20 5' 4" (1.626 m)    Weight:   Wt Readings from Last 1 Encounters:  05/16/20 100.7 kg     Estimated  Nutritional Needs:  Kcal:  9924-2683 Protein:  >/= 118 grams Fluid:  1.5-1.8 L/day      Jarome Matin, MS, RD, LDN, CNSC Inpatient Clinical Dietitian RD pager # available in [facility]  After hours/weekend pager # available in [facility]
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
## 2020-05-17 DIAGNOSIS — N170 Acute kidney failure with tubular necrosis: Secondary | ICD-10-CM

## 2020-05-17 DIAGNOSIS — D649 Anemia, unspecified: Secondary | ICD-10-CM

## 2020-05-17 LAB — BASIC METABOLIC PANEL
Anion gap: 17 — ABNORMAL HIGH (ref 5–15)
Anion gap: 20 — ABNORMAL HIGH (ref 5–15)
BUN: 129 mg/dL — ABNORMAL HIGH (ref 8–23)
BUN: 144 mg/dL — ABNORMAL HIGH (ref 8–23)
CO2: 32 mmol/L (ref 22–32)
CO2: 20 mmol/L — ABNORMAL LOW (ref 22–32)
Calcium: 5.9 mg/dL — CL (ref 8.9–10.3)
Calcium: 7 mg/dL — ABNORMAL LOW (ref 8.9–10.3)
Chloride: 90 mmol/L — ABNORMAL LOW (ref 98–111)
Chloride: 103 mmol/L (ref 98–111)
Creatinine, Ser: 3.34 mg/dL — ABNORMAL HIGH (ref 0.61–1.24)
Creatinine, Ser: 3.69 mg/dL — ABNORMAL HIGH (ref 0.61–1.24)
GFR, Estimated: 19 mL/min — ABNORMAL LOW (ref >60)
GFR, Estimated: 17 mL/min — ABNORMAL LOW (ref >60)
Glucose, Bld: 139 mg/dL — ABNORMAL HIGH (ref 70–99)
Glucose, Bld: 150 mg/dL — ABNORMAL HIGH (ref 70–99)
Potassium: 2.8 mmol/L — ABNORMAL LOW (ref 3.5–5.1)
Potassium: 4.1 mmol/L (ref 3.5–5.1)
Sodium: 139 mmol/L (ref 135–145)
Sodium: 143 mmol/L (ref 135–145)

## 2020-05-17 LAB — PHOSPHORUS: Phosphorus: 6 mg/dL — ABNORMAL HIGH (ref 2.5–4.6)

## 2020-05-17 LAB — CBC
HCT: 18.2 % — ABNORMAL LOW (ref 39.0–52.0)
Hemoglobin: 6.5 g/dL — CL (ref 13.0–17.0)
MCH: 32 pg (ref 26.0–34.0)
MCHC: 35.7 g/dL (ref 30.0–36.0)
MCV: 89.7 fL (ref 80.0–100.0)
Platelets: 74 10*3/uL — ABNORMAL LOW (ref 150–400)
RBC: 2.03 MIL/uL — ABNORMAL LOW (ref 4.22–5.81)
RDW: 18 % — ABNORMAL HIGH (ref 11.5–15.5)
WBC: 3.1 10*3/uL — ABNORMAL LOW (ref 4.0–10.5)
nRBC: 0 % (ref 0.0–0.2)

## 2020-05-17 LAB — GLUCOSE, CAPILLARY
Glucose-Capillary: 144 mg/dL — ABNORMAL HIGH (ref 70–99)
Glucose-Capillary: 138 mg/dL — ABNORMAL HIGH (ref 70–99)
Glucose-Capillary: 156 mg/dL — ABNORMAL HIGH (ref 70–99)
Glucose-Capillary: 148 mg/dL — ABNORMAL HIGH (ref 70–99)
Glucose-Capillary: 112 mg/dL — ABNORMAL HIGH (ref 70–99)

## 2020-05-17 LAB — PREPARE RBC (CROSSMATCH)

## 2020-05-17 LAB — TRIGLYCERIDES
Triglycerides: 1050 mg/dL — ABNORMAL HIGH (ref <150)
Triglycerides: 546 mg/dL — ABNORMAL HIGH (ref <150)

## 2020-05-17 LAB — URIC ACID: Uric Acid, Serum: 5.3 mg/dL (ref 3.7–8.6)

## 2020-05-17 LAB — MAGNESIUM: Magnesium: 1.8 mg/dL (ref 1.7–2.4)

## 2020-05-17 MED ORDER — SODIUM CHLORIDE 0.9% IV SOLUTION: Freq: Once | INTRAVENOUS | Status: AC

## 2020-05-17 MED ORDER — STERILE WATER FOR INJECTION IV SOLN
INTRAVENOUS | Status: DC
  Filled 2020-05-17 (×2): qty 150
  Filled 2020-05-17: qty 850
  Filled 2020-05-17 (×2): qty 150

## 2020-05-17 MED ORDER — POTASSIUM CHLORIDE 10 MEQ/50ML IV SOLN
10.0000 meq | INTRAVENOUS | Status: AC
  Administered 2020-05-17 (×4): 10 meq via INTRAVENOUS
  Filled 2020-05-17 (×4): qty 50

## 2020-05-17 MED ORDER — POTASSIUM CHLORIDE 10 MEQ/100ML IV SOLN: 10.0000 meq | INTRAVENOUS | Status: DC

## 2020-05-17 MED ORDER — DEXMEDETOMIDINE HCL IN NACL 200 MCG/50ML IV SOLN
0.0000 ug/kg/h | INTRAVENOUS | Status: DC
  Administered 2020-05-17: 0.6 ug/kg/h via INTRAVENOUS
  Administered 2020-05-17: 0.5 ug/kg/h via INTRAVENOUS
  Administered 2020-05-17: 0.4 ug/kg/h via INTRAVENOUS
  Administered 2020-05-17: 1.2 ug/kg/h via INTRAVENOUS
  Filled 2020-05-17 (×4): qty 50

## 2020-05-17 MED ORDER — FENTANYL CITRATE (PF) 100 MCG/2ML IJ SOLN
50.0000 ug | INTRAMUSCULAR | Status: DC | PRN
  Administered 2020-05-20 – 2020-05-21 (×2): 50 ug via INTRAVENOUS
  Filled 2020-05-17: qty 2

## 2020-05-17 MED ORDER — DEXMEDETOMIDINE HCL IN NACL 400 MCG/100ML IV SOLN
0.0000 ug/kg/h | INTRAVENOUS | Status: DC
  Administered 2020-05-17 – 2020-05-18 (×7): 1.2 ug/kg/h via INTRAVENOUS
  Administered 2020-05-18: 0.6 ug/kg/h via INTRAVENOUS
  Administered 2020-05-19 (×2): 1.1 ug/kg/h via INTRAVENOUS
  Administered 2020-05-19 (×3): 1 ug/kg/h via INTRAVENOUS
  Administered 2020-05-19: 1.1 ug/kg/h via INTRAVENOUS
  Administered 2020-05-20: 0.7 ug/kg/h via INTRAVENOUS
  Administered 2020-05-20 (×2): 1.1 ug/kg/h via INTRAVENOUS
  Administered 2020-05-20: 1 ug/kg/h via INTRAVENOUS
  Filled 2020-05-17 (×16): qty 100
  Filled 2020-05-17: qty 200
  Filled 2020-05-17: qty 100

## 2020-05-17 MED ORDER — CALCIUM GLUCONATE-NACL 1-0.675 GM/50ML-% IV SOLN
1.0000 g | Freq: Once | INTRAVENOUS | Status: AC
  Administered 2020-05-17: 1000 mg via INTRAVENOUS
  Filled 2020-05-17: qty 50

## 2020-05-17 MED ORDER — POTASSIUM CHLORIDE 20 MEQ/15ML (10%) PO SOLN
20.0000 meq | Freq: Once | ORAL | Status: AC
  Administered 2020-05-17: 20 meq
  Filled 2020-05-17: qty 15

## 2020-05-17 MED ORDER — FENTANYL CITRATE (PF) 100 MCG/2ML IJ SOLN
50.0000 ug | INTRAMUSCULAR | Status: DC | PRN
  Administered 2020-05-17 – 2020-05-20 (×18): 100 ug via INTRAVENOUS
  Filled 2020-05-17 (×18): qty 2

## 2020-05-17 MED ORDER — POLYETHYLENE GLYCOL 3350 17 G PO PACK: 17.0000 g | PACK | Freq: Every day | ORAL | Status: DC

## 2020-05-17 MED ORDER — SODIUM CHLORIDE 0.9 % IV SOLN
2.0000 g | INTRAVENOUS | Status: DC
  Administered 2020-05-17 – 2020-05-21 (×5): 2 g via INTRAVENOUS
  Filled 2020-05-17 (×5): qty 2

## 2020-05-17 MED ORDER — MAGNESIUM SULFATE IN D5W 1-5 GM/100ML-% IV SOLN
1.0000 g | Freq: Once | INTRAVENOUS | Status: AC
  Administered 2020-05-17: 1 g via INTRAVENOUS
  Filled 2020-05-17: qty 100

## 2020-05-17 MED ORDER — LACTATED RINGERS IV SOLN: INTRAVENOUS | Status: DC

## 2020-05-17 MED ORDER — VANCOMYCIN HCL 1250 MG/250ML IV SOLN
1250.0000 mg | INTRAVENOUS | Status: DC
  Administered 2020-05-17 – 2020-05-19 (×2): 1250 mg via INTRAVENOUS
  Filled 2020-05-17 (×2): qty 250

## 2020-05-17 NOTE — Plan of Care (Signed)
Update to Dean Dixon and [place] over the phone with Dr. Rowe Pavy from [facility]. They understand that overall his renal function is not improved. Given his progressive MOF, they are planning on withdrawing care if there has been no significant sustainable improvement by mid-day tomorrow. At this point no escalation in care, but all current care will be continued. They wish that if he passes away before then, he should pass away naturally. His code status has been changed to DNR. All questions were answered.  Julian Hy, DO 05/17/20 1:39 PM [facility]
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
## 2020-05-17 NOTE — Progress Notes (Signed)
Pharmacy Antibiotic Note  Dean Dixon is a 69 y.o. male admitted on 05/11/2020 with HCAP.  Pharmacy has been consulted for vancomycin and cefepime dosing.   Pt treatment had been narrowed but is not being expanded again as pt conditioned has worsened with deescalated therapy  Pt has previously received doses of vancomycin 1500 mg IV on 11/21 and vancomycin 1000 mg IV on 11/22   Plan:  Vancomycin 1250 mg IV q48h  Cefepime 2 gr IV q24h   Will obtain vancomycin levels as needed  Monitor clinical course, renal function, cultures as available   Height: 5\' 4"  (162.6 cm) Weight: 101 kg (222 lb 10.6 oz) IBW/kg (Calculated) : 59.2  Temp (24hrs), Avg:100.9 F (38.3 C), Min:99.9 F (37.7 C), Max:102 F (38.9 C)  Recent Labs  Lab 05/12/20 0116 05/12/20 1111 05/13/20 0549 05/13/20 1040 05/14/20 0434 05/14/20 0434 05/15/20 0230 05/15/20 0230 05/15/20 1457 05/15/20 2200 05/16/20 0317 05/16/20 1300 05/17/20 0430  WBC 3.6*   < >  --  3.2* 2.5*  --  3.6*  --   --   --  3.4*  --  3.1*  CREATININE  --    < >   < >  --  3.33*   < > 3.35*   < > 3.13* 3.30* 3.31* 3.61* 3.34*  LATICACIDVEN 1.0  --   --   --   --   --   --   --  1.1  --   --   --   --    < > = values in this interval not displayed.    Estimated Creatinine Clearance: 22.1 mL/min (A) (by C-G formula based on SCr of 3.34 mg/dL (H)).    Allergies  Allergen Reactions  . Ivp Dye [Iodinated Diagnostic Agents] Shortness Of Breath    "allergic to CT scan dye", took benadryl with last scan and tolerated well    Antimicrobials this admission: 11/8 cefepime >> 11/14; resume 11/21 >> 11/23, resume 11/25 >> 11/21 vanc >>  11/22, resume 11/25 >>   11/23 CTX >> 11/24  Dose adjustments this admission: 11/10 decrease cefepime 2g q24h for rising SCr 11/11 increase back to q12h for improved SCr   Microbiology results: 11/9 BCx: ngF 11/7 MRSA PCR: negative 11/7 Resp panel: neg COVID/Influenza 11/13 UCx ngF 11/21 TA: nl  [place] 11/21 MRSA PCR: neg 11/25 Resp culture:    Thank you for allowing pharmacy to be a part of this patient's care.   Royetta Asal, PharmD, BCPS 05/17/2020 10:32 AM

## 2020-05-17 NOTE — Progress Notes (Signed)
Pt moved from ICU 1235 to room ICU 1240.  Pt remained on mechanical ventilation throughout trip.  Pt remained stable and comfortable for the duration.

## 2020-05-17 NOTE — Progress Notes (Signed)
Green Progress Note Patient Name: Dean Dixon DOB: February 14, 1951 MRN: [id number]   Date of Service  05/17/2020  HPI/Events of Note  Hemoglobin 6.5  eICU Interventions  1 unit RBC , RN to take consent     Intervention Category Major Interventions: Other:  Dean Dixon 05/17/2020, 5:31 AM

## 2020-05-17 NOTE — Progress Notes (Signed)
NAME:  Dean Dixon, MRN:  [id number], DOB:  11-Oct-1950, LOS: 73 ADMISSION DATE:  05/15/2020, CONSULTATION DATE:  11/7/201 REFERRING MD:  [facility], CHIEF COMPLAINT:  Respiratory distress  Brief History   69 year old male with stage IV mantle cell lymphoma on CHOP presenting with N/V and progressive SOB over the last 2 weeks found to have acute left segmental PE, intubated 11/7-11/9 in setting hypertensive emergency/flash, reintubated 11/20 for multifactorial acute hypercapnic hypoxic respiratory failure   History of present illness   HPI obtained from medical chart review as patient is currently intubated and sedated on mechanical ventilation.  69 year old male with prior medical history of stage IV mantle cell lymphoma (diagnosed 06/20/2019) on CHOP, hypertension, diabetes type 2 who presented to [facility] on 11/7 with progressive shortness of breath over the last 2 weeks, worse over the last 2 days with above baseline abdominal distention, non bloody nausea/ vomiting, nonradiating intermittent midsternal chest tightness, and cough with lightly productive greenish nonbloody sputum.  Additionally reported ongoing swelling in his legs and testicles causing difficulty with urination.  Recent hospitalization 10/28 - 11/1 found to have new left upper quadrant abdominal mass and hypercalcemia.    In ER, he was afebrile, hypertensive, mildly tachypneic, and tachycardic with oxygen saturations of 98% on room air.  Noted to have multiple episodes of vomiting and retching treated with zofran.  KUB with non-obstructive gas pattern.   Labs noted for glucose 290 with HgbA1c 6.3, BUN 40, AG 17, lactic acid 3.2-> 2.3, troponin hs 52, BNP 133, D-dimer 2.47, Hgb 11.5/ HCT 35.5, platelets 100, neg COVID/ flu, UA noted turbid urine with 5 ketones, negative for leukocytes/ nitrates.  CXR showed small bilateral pleural effusion with atelectasis and low lung volumes.  He was pretreated given his contrast allergy. CTA PE and  CT abd/ pelvis showed segmental nonocclusive left pulmonary embolism without associated hemorrhage or pulmonary infarct.  Additionally noted bulky soft tissue mass in LUQ with extensive lymphadenopathy in the chest and abd with evidence of widespread intraperitoneal metastatic disease, small bilateral pleural effusion, and  tracheobronchomalacia. Started on heparin gtt per pharmacy.   He was admitted by [facility] to progressive care.  In unit, he was noted to become hypertensive with BP 250/100 with complaints of chest discomfort with noted tachypnea and lethargy.  He was treated with hydralazine and placed on nitro gtt for blood pressure management.  Repeated EKG was non acute.  ABG on NRB noted 7.291/ 56.7/ 275.  Bipap was attempted however patient kept pulling it off and therefore given concern for impending respiratory failure, he was intubated by anesthesia.  PCCM consulted for further management.   Past Medical History  Mantle cell lymphoma on CHOP, DM, HTN  Significant Hospital Events   11/07 Admitted to [facility]/ decompensated/ intubated 11/08 Weaned on PSV, lasix with 2.2L UOP  11/09 Extubated  11/11 Fever / leukocytosis improved, on abx  11/12 Mental status improved, on RA, palliative radiation  11/20 Reintubated for acute hypercapnic hypoxic respiratory failure  Consults:  PCCM 11/7, 11/20  Procedures:  ETT 11/7 >> 11/9 TLC R IJ 11/8 >> 11/12 ETT 11/19 >>11/23, 11/23>> PICC  Significant Diagnostic Tests:  11/7 KUB >> nonobstructive gas pattern 11/7 CTA PE/  CT abd/ pelvis >> Study is positive for segmental sized pulmonary emboli in the left lung. These appear nonocclusive at this time, and there is no associated pulmonary hemorrhage to suggest frank pulmonary infarct. Bulky soft tissue mass in the left upper quadrant with extensive lymphadenopathy  in the chest and abdomen, and evidence of widespread intraperitoneal metastatic disease. Small bilateral pleural effusions lying dependently.  Aortic atherosclerosis.  Tracheobronchomalacia. Cholelithiasis. Colonic diverticulosis. 11/8 [place] Ascites/ABD >> small volume ascites, insufficient for paracentesis, extensive omental caking and peritoneal nodularity consistent with peritoneal carcinomatosis  11/8 ECHO >> cystic structure noted in liver on subcostal images, LVEF 60-65%, no RWMA, RV systolic function normal, pericardial effusion is posterior to LV 11/8 LE Doppler >> negative for DVT 11/19 CXR bilateral effusions, atelectasis  Micro Data:  SARS 2/ flu 11/7 >> neg MRSA 11/7 >> neg UA 11/7 >> negative, 5 ketones BCx2 11/9 >>  Trach aspirate 11/21>GPC> normal flora  Antimicrobials:  Previously on azithro/ ceftriaxone, stopped 10/30 Cefepime 11/8 >> 11/14 Ceftriaxone 11/23>  Interim history/subjective:  Ordered to receive 1 unit pRBCs overnight-- coming from blood bank in [place]. Febrile overnight. Febrile all night.  Objective   Blood pressure (!) 177/67, pulse (!) 106, temperature (!) 100.8 F (38.2 C), resp. rate (!) 26, height 5\' 4"  (1.626 m), weight 101 kg, SpO2 94 %.    Vent Mode: PRVC FiO2 (%):  [30 %] 30 % Set Rate:  [22 bmp] 22 bmp Vt Set:  [470 mL] 470 mL PEEP:  [5 cmH20] 5 cmH20 Pressure Support:  [5 cmH20] 5 cmH20 Plateau Pressure:  [15 cmH20-19 cmH20] 17 cmH20   Intake/Output Summary (Last 24 hours) at 05/17/2020 0944 Last data filed at 05/17/2020 0931 Gross per 24 hour  Intake 2902.59 ml  Output 960 ml  Net 1942.59 ml   Filed Weights   05/15/20 0500 05/16/20 0428 05/17/20 0435  Weight: 104 kg 100.7 kg 101 kg   Physical Exam: General: critically ill appearing man, intubated, lightly sedated HEENT: [place]/AT, eyes anicteric, oral mucosa moist. Neuro: RASS -2, globally weak CV: tachycardic, regular rhythm PULM: Less tracheal secretions, faint rhonchi bilaterally. GI: obese, soft, mildly distended compared to yesterday. Extremities: pitting LE edema, PICC in RUE with surrounding bruising Skin: no  rashes, ecchymosis slowly improving   Resolved Hospital Problem list   DKA Hypercalcemia TLS Massive hemoptysis  Assessment & Plan:   Acute hypoxic & hypercapnic respiratory failure: Likely related predominantly to extravascular volume overload with effusions and pulmonary edema. LUQ mass, ascites impairing respiratory mechanics and contributing to atelectasis with V/Q mismatch/shunt. His hemoptysis seems improved.  It doesn't appear that his PE burden is newly worse based on bedside ultrasound overnight, stable heart rate and blood pressure. Hemoptysis resolved. HAP- febrile overnight, remains leukopenic -Continue low tidal volume ventilation, 4 to 8 cc/kg ideal body weight with goal plateaus and 30 driving pressure less than 15. -Daily SAT and SBT when appropriate -Continue antibiotics to complete 7 days of therapy; reescalating to cefepime vancomycin and recollecting sputum culture due to recurrent fevers.  Acute left segmental pulmonary embolism due to malignancy Has history of previous need for Coumadin about 20 years ago-unsure if this was an unprovoked VTE in the past -Heparin infusion on hold due to upper airway bleeding after NT suctioning and drop in hemoglobin  AKI with metabolic acidosis- likely attributable to renal failure. Metabolic acidosis improved with bicarb infusion. Uric acid slowly rising. Renal function minimally improved today. -Discontinue bicarb infusion -No plans for dialysis; he would be a poor candidate given multiorgan failure and progressive stage IV cancer. Have previously discussed with mPOA.  Type 2 diabetes with hyperglycemia -SSI as needed -Continue Lantus 8 units daily. -Goal BG 140-180 while admitted to the ICU  Acute kidney injury- likely due to sepsis. Uric  acid levels remain normal. FeNa 2.5>  likely ATN from sepsis. Hypernatremia-resolved -Continue enteral free water -Continue monitoring renal function daily -Strict I's/O -Renally dose meds  and avoid nephrotoxic meds  Pancytopenia due to chemotherapy Acute worsening of anemia likely due to hypervolemia, acute on chronic illness, frequent lab draws -Continue to hold heparin -Transfuse for hemoglobin less than 7 or hemodynamically significant bleeding.  1 unit being sent from [place] today.  Stage IV mantle cell lymphoma with LUQ mass/ extensive metastatic disease Last CHOP 10/30, follows with Dr. Irene Limbo -Appreciate oncology's input.  All future chemo would be with palliative rather than curative intent given progression of his disease.  Unfortunately he would not be a candidate until his functional status improved. -Palliative radiation per radiation oncology -PT consult / mobilize when stable  History of tumor lysis syndrome-uric acid level slowly increasing -Continue daily allopurinol -Continue to monitor  GoC -I discussed the patient's care with his friend and medical power of attorney Nicole Kindred and Pe[place] wife.  He had been doing very well over the summer, tolerating a cruise in the [place].  Over the last month prior to admission he had deteriorating functional status, but lives alone and had been continuing to care for himself.  He is an " eternal optimist" and would want to continue aggressive therapy.  He would not want his final days in life to be spent on a ventilator.  They understand my concerns regarding prolonged admission and repeat hospital admission with background progressive cancer despite aggressive therapy.  They are aware that he would require significant rehabilitation prior to being discharged, but feel that he would want to continue aggressive care at this time.  We discussed the potential need for bronchoscopy if his hemoptysis worsens, which they verbally consented to.  They will be out of town for few days but plan to come back on Wednesday. -Power of attorney updated on 11/21 via phone -Updated power of attorney's wife on 11/23. -In person meeting  on 11/24-no plans for dialysis.  Will monitor renal function in the coming days.  Best practice:  Diet: NPO Pain/Anxiety/Delirium protocol (if indicated): PAD protocol  VAP protocol (if indicated): Yes DVT prophylaxis: Systemic heparin on hold GI prophylaxis: PPI Glucose control: CBG q4h Mobility: BR Code Status: Full code Family Communication: will update this afternoon disposition: ICU  Labs   CBC: Recent Labs  Lab 05/11/20 0723 05/11/20 2044 05/12/20 0116 05/12/20 1111 05/13/20 1040 05/14/20 0434 05/15/20 0230 05/16/20 0317 05/17/20 0430  WBC 2.7*  --  3.6*   < > 3.2* 2.5* 3.6* 3.4* 3.1*  NEUTROABS 2.0  --  2.8  --   --   --   --   --   --   HGB 10.2*   < > 10.7*   < > 8.9* 8.0* 9.1* 7.2* 6.5*  HCT 32.7*   < > 33.9*   < > 28.3* 24.9* 27.3* 21.4* 18.2*  MCV 94.5  --  95.0   < > 94.6 94.7 92.2 89.5 89.7  PLT 51*  --  63*   < > 67* 71* 86* 77* 74*   < > = values in this interval not displayed.    Basic Metabolic Panel: Recent Labs  Lab 05/13/20 0549 05/13/20 0549 05/14/20 0434 05/14/20 0434 05/15/20 0230 05/15/20 0230 05/15/20 1457 05/15/20 2200 05/16/20 0317 05/16/20 1300 05/17/20 0430  NA 144   < > 139   < > 142   < > 143 144 142 144 139  K  3.9   < > 4.0   < > 3.8   < > 4.1 3.4* 3.5 3.5 2.8*  CL 115*   < > 108   < > 113*   < > 109 103 99 105 90*  CO2 20*   < > 16*   < > 12*   < > 18* 23 25 19* 32  GLUCOSE 117*   < > 101*   < > 119*   < > 146* 132* 130* 132* 139*  BUN 82*   < > 101*   < > 112*   < > 134* 133* 140* 146* 129*  CREATININE 2.93*   < > 3.33*   < > 3.35*   < > 3.13* 3.30* 3.31* 3.61* 3.34*  CALCIUM 7.8*   < > 7.7*   < > 7.3*   < > 7.3* 6.5* 6.5* 6.9* 5.9*  MG 2.0  --  2.2  --  2.1  --   --   --  1.9  --  1.8  PHOS 4.5  --  6.2*  --  6.8*  --   --   --  7.4*  --  6.0*   < > = values in this interval not displayed.   GFR: Estimated Creatinine Clearance: 22.1 mL/min (A) (by C-G formula based on SCr of 3.34 mg/dL (H)). Recent Labs  Lab  05/12/20 0116 05/12/20 1111 05/14/20 0434 05/15/20 0230 05/15/20 1457 05/16/20 0317 05/17/20 0430  WBC 3.6*   < > 2.5* 3.6*  --  3.4* 3.1*  LATICACIDVEN 1.0  --   --   --  1.1  --   --    < > = values in this interval not displayed.    Liver Function Tests: Recent Labs  Lab 05/11/20 0723 05/12/20 0115  AST 16 18  ALT 12 13  ALKPHOS 69 79  BILITOT 0.6 0.7  PROT 4.9* 5.4*  ALBUMIN 2.6* 2.9*   No results for input(s): LIPASE, AMYLASE in the last 168 hours. No results for input(s): AMMONIA in the last 168 hours.  ABG    Component Value Date/Time   PHART 7.374 05/16/2020 0330   PCO2ART 32.0 05/16/2020 0330   PO2ART 79.1 (L) 05/16/2020 0330   HCO3 18.2 (L) 05/16/2020 0330   TCO2 29 04/26/2020 2146   ACIDBASEDEF 5.8 (H) 05/16/2020 0330   O2SAT 95.8 05/16/2020 0330     Coagulation Profile: No results for input(s): INR, PROTIME in the last 168 hours.  Cardiac Enzymes: No results for input(s): CKTOTAL, CKMB, CKMBINDEX, TROPONINI in the last 168 hours.  HbA1C: Hgb A1c MFr Bld  Date/Time Value Ref Range Status  04/28/2020 05:49 PM 6.3 (H) 4.8 - 5.6 % Final    Comment:    (NOTE) Pre diabetes:          5.7%-6.4%  Diabetes:              >6.4%  Glycemic control for   <7.0% adults with diabetes   11/14/2019 06:13 AM 5.4 4.8 - 5.6 % Final    Comment:    (NOTE) Pre diabetes:          5.7%-6.4% Diabetes:              >6.4% Glycemic control for   <7.0% adults with diabetes     CBG: Recent Labs  Lab 05/16/20 1549 05/16/20 1953 05/16/20 2339 05/17/20 0423 05/17/20 0804  GLUCAP 119* 140* 113* 144* 138*    This patient is critically ill  with multiple organ system failure which requires frequent high complexity decision making, assessment, support, evaluation, and titration of therapies. This was completed through the application of advanced monitoring technologies and extensive interpretation of multiple databases. During this encounter critical care time was  devoted to patient care services described in this note for 35 minutes.  Julian Hy, DO 05/17/20 10:02 AM [facility]

## 2020-05-17 NOTE — Progress Notes (Addendum)
eLink Physician-Brief Progress Note Patient Name: Cresencio Reesor DOB: 09-Jan-1951 MRN: [id number]   Date of Service  05/17/2020  HPI/Events of Note  Electrolyte issues - low K, mag and calcium (corrected calcium is 6.78)  eICU Interventions  Replete mag and K first Then replete calcium - orders placed Repeat K at 9 am      Intervention Category Major Interventions: Electrolyte abnormality - evaluation and management  Yaretzy Olazabal G Freeland Pracht 05/17/2020, 5:59 AM   Addendum  Bicarb is 32 on BMP Stop IV bicarb Switch to LR Repeat labs at 1130 am, not 9 am (error in prior note)

## 2020-05-18 ENCOUNTER — Inpatient Hospital Stay (HOSPITAL_COMMUNITY): Payer: Medicare Other

## 2020-05-18 LAB — CBC
HCT: 23.3 % — ABNORMAL LOW (ref 39.0–52.0)
Hemoglobin: 7.5 g/dL — ABNORMAL LOW (ref 13.0–17.0)
MCH: 29.9 pg (ref 26.0–34.0)
MCHC: 32.2 g/dL (ref 30.0–36.0)
MCV: 92.8 fL (ref 80.0–100.0)
Platelets: 75 10*3/uL — ABNORMAL LOW (ref 150–400)
RBC: 2.51 MIL/uL — ABNORMAL LOW (ref 4.22–5.81)
RDW: 17.5 % — ABNORMAL HIGH (ref 11.5–15.5)
WBC: 3.2 10*3/uL — ABNORMAL LOW (ref 4.0–10.5)
nRBC: 0.9 % — ABNORMAL HIGH (ref 0.0–0.2)

## 2020-05-18 LAB — MAGNESIUM: Magnesium: 2.2 mg/dL (ref 1.7–2.4)

## 2020-05-18 LAB — BASIC METABOLIC PANEL
Anion gap: 18 — ABNORMAL HIGH (ref 5–15)
BUN: 153 mg/dL — ABNORMAL HIGH (ref 8–23)
CO2: 20 mmol/L — ABNORMAL LOW (ref 22–32)
Calcium: 6.6 mg/dL — ABNORMAL LOW (ref 8.9–10.3)
Chloride: 102 mmol/L (ref 98–111)
Creatinine, Ser: 4.26 mg/dL — ABNORMAL HIGH (ref 0.61–1.24)
GFR, Estimated: 14 mL/min — ABNORMAL LOW (ref >60)
Glucose, Bld: 158 mg/dL — ABNORMAL HIGH (ref 70–99)
Potassium: 3.5 mmol/L (ref 3.5–5.1)
Sodium: 140 mmol/L (ref 135–145)

## 2020-05-18 LAB — BPAM RBC
Blood Product Expiration Date: 202111261525
ISSUE DATE / TIME: 202111251849
Unit Type and Rh: 9500

## 2020-05-18 LAB — GLUCOSE, CAPILLARY
Glucose-Capillary: 101 mg/dL — ABNORMAL HIGH (ref 70–99)
Glucose-Capillary: 150 mg/dL — ABNORMAL HIGH (ref 70–99)
Glucose-Capillary: 153 mg/dL — ABNORMAL HIGH (ref 70–99)

## 2020-05-18 LAB — TYPE AND SCREEN
ABO/RH(D): O NEG
Antibody Screen: NEGATIVE
Unit division: 0

## 2020-05-18 LAB — HEMOGLOBIN AND HEMATOCRIT, BLOOD
HCT: 23.5 % — ABNORMAL LOW (ref 39.0–52.0)
Hemoglobin: 7.6 g/dL — ABNORMAL LOW (ref 13.0–17.0)

## 2020-05-18 LAB — TRIGLYCERIDES
Triglycerides: 392 mg/dL — ABNORMAL HIGH (ref <150)
Triglycerides: 382 mg/dL — ABNORMAL HIGH (ref <150)

## 2020-05-18 LAB — PHOSPHORUS: Phosphorus: 8.3 mg/dL — ABNORMAL HIGH (ref 2.5–4.6)

## 2020-05-18 LAB — URIC ACID: Uric Acid, Serum: 7.2 mg/dL (ref 3.7–8.6)

## 2020-05-18 NOTE — Progress Notes (Signed)
NAME:  Dean Dixon, MRN:  [id number], DOB:  May 01, 1951, LOS: 46 ADMISSION DATE:  05/20/2020, CONSULTATION DATE:  11/7/201 REFERRING MD:  [facility], CHIEF COMPLAINT:  Respiratory distress  Brief History   69 year old male with stage IV mantle cell lymphoma on CHOP presenting with N/V and progressive SOB over the last 2 weeks found to have acute left segmental PE, intubated 11/7-11/9 in setting hypertensive emergency/flash, reintubated 11/20 for multifactorial acute hypercapnic hypoxic respiratory failure   History of present illness   HPI obtained from medical chart review as patient is currently intubated and sedated on mechanical ventilation.  69 year old male with prior medical history of stage IV mantle cell lymphoma (diagnosed 06/20/2019) on CHOP, hypertension, diabetes type 2 who presented to [facility] on 11/7 with progressive shortness of breath over the last 2 weeks, worse over the last 2 days with above baseline abdominal distention, non bloody nausea/ vomiting, nonradiating intermittent midsternal chest tightness, and cough with lightly productive greenish nonbloody sputum.  Additionally reported ongoing swelling in his legs and testicles causing difficulty with urination.  Recent hospitalization 10/28 - 11/1 found to have new left upper quadrant abdominal mass and hypercalcemia.    In ER, he was afebrile, hypertensive, mildly tachypneic, and tachycardic with oxygen saturations of 98% on room air.  Noted to have multiple episodes of vomiting and retching treated with zofran.  KUB with non-obstructive gas pattern.   Labs noted for glucose 290 with HgbA1c 6.3, BUN 40, AG 17, lactic acid 3.2-> 2.3, troponin hs 52, BNP 133, D-dimer 2.47, Hgb 11.5/ HCT 35.5, platelets 100, neg COVID/ flu, UA noted turbid urine with 5 ketones, negative for leukocytes/ nitrates.  CXR showed small bilateral pleural effusion with atelectasis and low lung volumes.  He was pretreated given his contrast allergy. CTA PE and  CT abd/ pelvis showed segmental nonocclusive left pulmonary embolism without associated hemorrhage or pulmonary infarct.  Additionally noted bulky soft tissue mass in LUQ with extensive lymphadenopathy in the chest and abd with evidence of widespread intraperitoneal metastatic disease, small bilateral pleural effusion, and  tracheobronchomalacia. Started on heparin gtt per pharmacy.   He was admitted by [facility] to progressive care.  In unit, he was noted to become hypertensive with BP 250/100 with complaints of chest discomfort with noted tachypnea and lethargy.  He was treated with hydralazine and placed on nitro gtt for blood pressure management.  Repeated EKG was non acute.  ABG on NRB noted 7.291/ 56.7/ 275.  Bipap was attempted however patient kept pulling it off and therefore given concern for impending respiratory failure, he was intubated by anesthesia.  PCCM consulted for further management.   Past Medical History  Mantle cell lymphoma on CHOP, DM, HTN  Significant Hospital Events   11/07 Admitted to [facility]/ decompensated/ intubated 11/08 Weaned on PSV, lasix with 2.2L UOP  11/09 Extubated  11/11 Fever / leukocytosis improved, on abx  11/12 Mental status improved, on RA, palliative radiation  11/20 Reintubated for acute hypercapnic hypoxic respiratory failure  Consults:  PCCM 11/7, 11/20 Palliative Care Oncology  Procedures:  ETT 11/7 >> 11/9 TLC R IJ 11/8 >> 11/12 ETT 11/19 >>11/23, 11/23>> PICC  Significant Diagnostic Tests:  11/7 KUB >> nonobstructive gas pattern 11/7 CTA PE/  CT abd/ pelvis >> Study is positive for segmental sized pulmonary emboli in the left lung. These appear nonocclusive at this time, and there is no associated pulmonary hemorrhage to suggest frank pulmonary infarct. Bulky soft tissue mass in the left upper quadrant  with extensive lymphadenopathy in the chest and abdomen, and evidence of widespread intraperitoneal metastatic disease. Small bilateral pleural  effusions lying dependently. Aortic atherosclerosis.  Tracheobronchomalacia. Cholelithiasis. Colonic diverticulosis. 11/8 [place] Ascites/ABD >> small volume ascites, insufficient for paracentesis, extensive omental caking and peritoneal nodularity consistent with peritoneal carcinomatosis  11/8 ECHO >> cystic structure noted in liver on subcostal images, LVEF 60-65%, no RWMA, RV systolic function normal, pericardial effusion is posterior to LV 11/8 LE Doppler >> negative for DVT 11/19 CXR bilateral effusions, atelectasis  Micro Data:  SARS 2/ flu 11/7 >> neg MRSA 11/7 >> neg UA 11/7 >> negative, 5 ketones BCx2 11/9 >>  Trach aspirate 11/21>GPC> normal flora Trach aspirate 11/25> abundant WBC, rare GPC.  Antimicrobials:  Previously on azithro/ ceftriaxone, stopped 10/30 Cefepime 11/8 >> 11/14; 11/21-22, 11/25> vanc 11/21-22, 11/25> Ceftriaxone 11/23- 11/24  Interim history/subjective:  Mr. Dean Dixon denies complaints this morning. He remains febrile overnight.  Objective   Blood pressure 128/62, pulse 69, temperature (!) 100.8 F (38.2 C), resp. rate (!) 21, height 5\' 4"  (1.626 m), weight 102.7 kg, SpO2 94 %.    Vent Mode: PRVC FiO2 (%):  [30 %] 30 % Set Rate:  [22 bmp] 22 bmp Vt Set:  [470 mL] 470 mL PEEP:  [5 cmH20] 5 cmH20 Plateau Pressure:  [19 cmH20-22 cmH20] 22 cmH20   Intake/Output Summary (Last 24 hours) at 05/18/2020 3785 Last data filed at 05/18/2020 0700 Gross per 24 hour  Intake 2264.32 ml  Output 1750 ml  Net 514.32 ml   Filed Weights   05/16/20 0428 05/17/20 0435 05/18/20 0500  Weight: 100.7 kg 101 kg 102.7 kg   Physical Exam: General: critically ill appearing man laying in bed in NAD, intubated, lightly sedated HEENT: [place]/AT, eyes anicteric Neuro: RASS -3, tracks but falls back asleep fast. Not nodding or answering questions today. CV: regular rate and rhythm PULM: no rhonchi, breathing comfortably on MV GI: obese, soft, NT, ND Extremities: BLE edema, no  cyanosis or clubbing Skin: no rashes, ongoing ecchymoses on arms   Resolved Hospital Problem list   DKA Hypercalcemia TLS Massive hemoptysis  Assessment & Plan:   Acute hypoxic & hypercapnic respiratory failure- multifactorial.  Predominantly now related to lobar pneumonia. Hemoptysis resolved. HAP-persistently febrile overnight, remains leukopenic -Continue low tidal volume ventilation, 4 to 8 cc/kg ideal body weight with goal plateau less than 30 and driving pressure less than 15.  Titrate PEEP and FiO2 per ARDS protocol. -Daily SAT and SBT when appropriate -Continue broad-spectrum antibiotics.  Continue monitoring cultures.  Acute left segmental pulmonary embolism due to malignancy Has history of previous need for Coumadin about 20 years ago-unsure if this was an unprovoked VTE in the past -Heparin infusion on hold due to upper airway bleeding after NT suctioning and drop in hemoglobin. Hb has remained stable overnight since transfusion.  AKI with metabolic acidosis- likely attributable to renal failure. Metabolic acidosis improved with bicarb infusion. Uric acid slowly rising. Renal function minimally improved today. -continue bicarb infusion> had severe worsening in acidosis quickly after it was discontinued yesterday. -No plans for dialysis; he would be a poor candidate given multiorgan failure and progressive stage IV cancer. Have discussed with mPOA.  Type 2 diabetes with controlled hyperglycemia -SSI as needed -Goal BG 140-180 while admitted to the ICU  Acute kidney injury- likely due to sepsis. Uric acid levels remain normal. FeNa 2.5>  likely ATN from sepsis. Hypernatremia-resolved -Continue enteral free water to maintain normal Na+ -Continue monitoring renal function daily -Strict  I's/O -Renally dose meds and avoid nephrotoxic meds -I have discussed his worsening renal function with [name] this morning. She understands the poor prognosis with this degree of ongoing  renal failure.  Pancytopenia due to chemotherapy; failing to recover over the past week. Acute worsening of anemia likely due to hypervolemia, acute on chronic illness, frequent lab draws -Continue to hold heparin -Continue to monitor  Stage IV mantle cell lymphoma with LUQ mass/ extensive metastatic disease Last CHOP 10/30, follows with Dr. Irene Limbo -Appreciate oncology's input.  All future chemo would be with palliative rather than curative intent given progression of his disease.  Unfortunately he would not be a candidate until his functional status improved. -Palliative radiation per radiation oncology  History of tumor lysis syndrome-uric acid level continues increasing likely due to renal dysfunction rather than TLS given lack of hyperkalemia -Continue daily allopurinol -Continue to monitor  GoC -I discussed the patient's care with his friend and medical power of attorney Nicole Kindred and [place] wife.  He had been doing very well over the summer, tolerating a cruise in the [place].  Over the last month prior to admission he had deteriorating functional status, but lives alone and had been continuing to care for himself.  He is an " eternal optimist" and would want to continue aggressive therapy.  He would not want his final days in life to be spent on a ventilator.  They understand my concerns regarding prolonged admission and repeat hospital admission with background progressive cancer despite aggressive therapy.  They are aware that he would require significant rehabilitation prior to being discharged, but feel that he would want to continue aggressive care at this time.  We discussed the potential need for bronchoscopy if his hemoptysis worsens, which they verbally consented to.  They will be out of town for few days but plan to come back on Wednesday. -Power of attorney updated on 11/21 via phone -Updated power of attorney's wife on 11/23. -In person meeting on 11/24-no plans for  dialysis.  Will monitor renal function in the coming days. -11/25> updated paula and Nicole Kindred, not escalating care and code status canged to DNR -11/26> [name] updated over the phone. She is talking to [place] regarding plans moving forward. Liberalized visitation.  Best practice:  Diet: NPO Pain/Anxiety/Delirium protocol (if indicated): PAD protocol  VAP protocol (if indicated): Yes DVT prophylaxis: Systemic heparin on hold GI prophylaxis: PPI Glucose control: CBG q4h Mobility: BR Code Status: Full code Family Communication: see above disposition: ICU  Labs   CBC: Recent Labs  Lab 05/12/20 0116 05/12/20 1111 05/14/20 0434 05/14/20 0434 05/15/20 0230 05/16/20 0317 05/17/20 0430 05/18/20 0115 05/18/20 0513  WBC 3.6*   < > 2.5*  --  3.6* 3.4* 3.1*  --  3.2*  NEUTROABS 2.8  --   --   --   --   --   --   --   --   HGB 10.7*   < > 8.0*   < > 9.1* 7.2* 6.5* 7.6* 7.5*  HCT 33.9*   < > 24.9*   < > 27.3* 21.4* 18.2* 23.5* 23.3*  MCV 95.0   < > 94.7  --  92.2 89.5 89.7  --  92.8  PLT 63*   < > 71*  --  86* 77* 74*  --  75*   < > = values in this interval not displayed.    Basic Metabolic Panel: Recent Labs  Lab 05/14/20 0434 05/14/20 0434 05/15/20 0230 05/15/20 1457 05/16/20 0973  05/16/20 1300 05/17/20 0430 05/17/20 1130 05/18/20 0513  NA 139   < > 142   < > 142 144 139 143 140  K 4.0   < > 3.8   < > 3.5 3.5 2.8* 4.1 3.5  CL 108   < > 113*   < > 99 105 90* 103 102  CO2 16*   < > 12*   < > 25 19* 32 20* 20*  GLUCOSE 101*   < > 119*   < > 130* 132* 139* 150* 158*  BUN 101*   < > 112*   < > 140* 146* 129* 144* 153*  CREATININE 3.33*   < > 3.35*   < > 3.31* 3.61* 3.34* 3.69* 4.26*  CALCIUM 7.7*   < > 7.3*   < > 6.5* 6.9* 5.9* 7.0* 6.6*  MG 2.2  --  2.1  --  1.9  --  1.8  --  2.2  PHOS 6.2*  --  6.8*  --  7.4*  --  6.0*  --  8.3*   < > = values in this interval not displayed.   GFR: Estimated Creatinine Clearance: 17.5 mL/min (A) (by C-G formula based on SCr of 4.26  mg/dL (H)). Recent Labs  Lab 05/12/20 0116 05/12/20 1111 05/15/20 0230 05/15/20 1457 05/16/20 0317 05/17/20 0430 05/18/20 0513  WBC 3.6*   < > 3.6*  --  3.4* 3.1* 3.2*  LATICACIDVEN 1.0  --   --  1.1  --   --   --    < > = values in this interval not displayed.    Liver Function Tests: Recent Labs  Lab 05/12/20 0115  AST 18  ALT 13  ALKPHOS 79  BILITOT 0.7  PROT 5.4*  ALBUMIN 2.9*   No results for input(s): LIPASE, AMYLASE in the last 168 hours. No results for input(s): AMMONIA in the last 168 hours.  ABG    Component Value Date/Time   PHART 7.374 05/16/2020 0330   PCO2ART 32.0 05/16/2020 0330   PO2ART 79.1 (L) 05/16/2020 0330   HCO3 18.2 (L) 05/16/2020 0330   TCO2 29 04/25/2020 2146   ACIDBASEDEF 5.8 (H) 05/16/2020 0330   O2SAT 95.8 05/16/2020 0330     Coagulation Profile: No results for input(s): INR, PROTIME in the last 168 hours.  Cardiac Enzymes: No results for input(s): CKTOTAL, CKMB, CKMBINDEX, TROPONINI in the last 168 hours.  HbA1C: Hgb A1c MFr Bld  Date/Time Value Ref Range Status  05/05/2020 05:49 PM 6.3 (H) 4.8 - 5.6 % Final    Comment:    (NOTE) Pre diabetes:          5.7%-6.4%  Diabetes:              >6.4%  Glycemic control for   <7.0% adults with diabetes   11/14/2019 06:13 AM 5.4 4.8 - 5.6 % Final    Comment:    (NOTE) Pre diabetes:          5.7%-6.4% Diabetes:              >6.4% Glycemic control for   <7.0% adults with diabetes     CBG: Recent Labs  Lab 05/17/20 1156 05/17/20 1613 05/17/20 1954 05/18/20 0007 05/18/20 0757  GLUCAP 156* 148* 112* 150* 101*    This patient is critically ill with multiple organ system failure which requires frequent high complexity decision making, assessment, support, evaluation, and titration of therapies. This was completed through the application of advanced monitoring  technologies and extensive interpretation of multiple databases. During this encounter critical care time was devoted  to patient care services described in this note for 42 minutes.  Julian Hy, DO 05/18/20 8:32 AM [facility]

## 2020-05-18 NOTE — Progress Notes (Addendum)
Pt. Moved from room 1235 to 1240, RRT, RN, & NT at bedside. Patient remained stable.   E-link and central tele notified.

## 2020-05-18 NOTE — Plan of Care (Signed)
I had a meeting with Dean Dixon, his wife [name], and his daughter. We discussed Steve's ongoing declining renal function and his overall prognosis. They wish to continue antibiotics and bicarb with the hope that this may be something reversible, although this is unlikely. They want to stop things that are potentially uncomfortable- no accuchecks or blood transfusions. No escalation in care. If/ when his renal failure progresses to the point that he would be unable to be awake, they want to terminally extubate at that time and focus on comfort. Visitation is liberalized for them to let a few friends they think would want to come see him to say goodbye. He remains DNR.  Julian Hy, DO 05/18/20 1:33 PM [facility]

## 2020-05-19 DIAGNOSIS — N179 Acute kidney failure, unspecified: Secondary | ICD-10-CM | POA: Diagnosis not present

## 2020-05-19 DIAGNOSIS — J9621 Acute and chronic respiratory failure with hypoxia: Secondary | ICD-10-CM | POA: Diagnosis not present

## 2020-05-19 LAB — CULTURE, RESPIRATORY W GRAM STAIN: Culture: NORMAL

## 2020-05-19 LAB — BASIC METABOLIC PANEL
Anion gap: >20 — ABNORMAL HIGH (ref 5–15)
BUN: 168 mg/dL — ABNORMAL HIGH (ref 8–23)
CO2: 19 mmol/L — ABNORMAL LOW (ref 22–32)
Calcium: 6.5 mg/dL — ABNORMAL LOW (ref 8.9–10.3)
Chloride: 100 mmol/L (ref 98–111)
Creatinine, Ser: 4.64 mg/dL — ABNORMAL HIGH (ref 0.61–1.24)
GFR, Estimated: 13 mL/min — ABNORMAL LOW (ref >60)
Glucose, Bld: 181 mg/dL — ABNORMAL HIGH (ref 70–99)
Potassium: 3.8 mmol/L (ref 3.5–5.1)
Sodium: 142 mmol/L (ref 135–145)

## 2020-05-19 LAB — MAGNESIUM: Magnesium: 2 mg/dL (ref 1.7–2.4)

## 2020-05-19 LAB — PHOSPHORUS: Phosphorus: 9.3 mg/dL — ABNORMAL HIGH (ref 2.5–4.6)

## 2020-05-19 NOTE — Progress Notes (Signed)
NAME:  Dean Dixon, MRN:  [id number], DOB:  06-22-1951, LOS: 39 ADMISSION DATE:  05/20/2020, CONSULTATION DATE:  11/7/201 REFERRING MD:  [facility], CHIEF COMPLAINT:  Respiratory distress  Brief History   69 year old male with stage IV mantle cell lymphoma on CHOP presenting with N/V and progressive SOB x  2 weeks PTA found to have acute left segmental PE, intubated 11/7-11/9 in setting hypertensive emergency/flash, reintubated 11/20 for multifactorial acute hypercapnic hypoxic respiratory failure   History of present illness   HPI obtained from medical chart review as patient is currently intubated and sedated on mechanical ventilation.  69 year old male with prior medical history of stage IV mantle cell lymphoma (diagnosed 06/20/2019) on CHOP, hypertension, diabetes type 2 who presented to [facility] on 11/7 with progressive shortness of breath over the last 2 weeks, worse over the last 2 days with above baseline abdominal distention, non bloody nausea/ vomiting, nonradiating intermittent midsternal chest tightness, and cough with lightly productive greenish nonbloody sputum.  Additionally reported ongoing swelling in his legs and testicles causing difficulty with urination.  Recent hospitalization 10/28 - 11/1 found to have new left upper quadrant abdominal mass and hypercalcemia.    In ER, he was afebrile, hypertensive, mildly tachypneic, and tachycardic with oxygen saturations of 98% on room air.  Noted to have multiple episodes of vomiting and retching treated with zofran.  KUB with non-obstructive gas pattern.   Labs noted for glucose 290 with HgbA1c 6.3, BUN 40, AG 17, lactic acid 3.2-> 2.3, troponin hs 52, BNP 133, D-dimer 2.47, Hgb 11.5/ HCT 35.5, platelets 100, neg COVID/ flu, UA noted turbid urine with 5 ketones, negative for leukocytes/ nitrates.  CXR showed small bilateral pleural effusion with atelectasis and low lung volumes.  He was pretreated given his contrast allergy. CTA PE and CT abd/  pelvis showed segmental nonocclusive left pulmonary embolism without associated hemorrhage or pulmonary infarct.  Additionally noted bulky soft tissue mass in LUQ with extensive lymphadenopathy in the chest and abd with evidence of widespread intraperitoneal metastatic disease, small bilateral pleural effusion, and  tracheobronchomalacia. Started on heparin gtt per pharmacy.   He was admitted by [facility] to progressive care.  In unit, he was noted to become hypertensive with BP 250/100 with complaints of chest discomfort with noted tachypnea and lethargy.  He was treated with hydralazine and placed on nitro gtt for blood pressure management.  Repeated EKG was non acute.  ABG on NRB noted 7.291/ 56.7/ 275.  Bipap was attempted however patient kept pulling it off and therefore given concern for impending respiratory failure, he was intubated by anesthesia.  PCCM consulted for further management.   Past Medical History  Mantle cell lymphoma on CHOP, DM, HTN  Significant Hospital Events   11/07 Admitted to [facility]/ decompensated/ intubated 11/08 Weaned on PSV, lasix with 2.2L UOP  11/09 Extubated  11/11 Fever / leukocytosis improved, on abx  11/12 Mental status improved, on RA, palliative radiation  11/20 Reintubated for acute hypercapnic hypoxic respiratory failure  Consults:  PCCM 11/7, 11/20 Palliative Care Oncology  Procedures:  ETT 11/7 > 11/9 TLC R IJ 11/8 > 11/12 ETT 11/19 >>11/23, 11/23>>> PICC 11/16 >>>  Significant Diagnostic Tests:  11/7 KUB >> nonobstructive gas pattern 11/7 CTA PE/  CT abd/ pelvis >> Study is positive for segmental sized pulmonary emboli in the left lung. These appear nonocclusive at this time, and there is no associated pulmonary hemorrhage to suggest frank pulmonary infarct. Bulky soft tissue mass in the left  upper quadrant with extensive lymphadenopathy in the chest and abdomen, and evidence of widespread intraperitoneal metastatic disease. Small bilateral pleural  effusions lying dependently. Aortic atherosclerosis.  Tracheobronchomalacia. Cholelithiasis. Colonic diverticulosis. 11/8 [place] Ascites/ABD >> small volume ascites, insufficient for paracentesis, extensive omental caking and peritoneal nodularity consistent with peritoneal carcinomatosis  11/8 ECHO >> cystic structure noted in liver on subcostal images, LVEF 60-65%, no RWMA, RV systolic function normal, pericardial effusion is posterior to LV 11/8 LE Doppler >> negative for DVT 11/19 CXR bilateral effusions, atelectasis  Micro Data:  SARS 2/ flu PCR 11/7 >> neg MRSA PCR  11/7 >> neg UA 11/7 >> negative  BCx2 11/9 >> neg MRSA PCR  11/21  Neg  Trach aspirate 11/21>GPC> normal flora Trach aspirate 11/25> abundant WBC, rare GPC  >>> nl flora  Antimicrobials:  Previously on azithro/ ceftriaxone, stopped 10/30 Cefepime 11/8 >> 11/14; 11/21-22, 11/25>>> vanc 11/21-22, 11/25 >>> Ceftriaxone 11/23- 11/24   Scheduled Meds: . allopurinol  100 mg Per Tube BID  . amLODipine  10 mg Per Tube Daily  . chlorhexidine gluconate (MEDLINE KIT)  15 mL Mouth Rinse BID  . Chlorhexidine Gluconate Cloth  6 each Topical Daily  . docusate  100 mg Per Tube BID  . feeding supplement (PROSource TF)  45 mL Per Tube BID  . free water  200 mL Per Tube Q4H  . mouth rinse  15 mL Mouth Rinse 10 times per day  . multivitamin  15 mL Per Tube Daily  . pantoprazole (PROTONIX) IV  40 mg Intravenous Q12H  . polyethylene glycol  17 g Per Tube Daily  . sodium chloride flush  10-40 mL Intracatheter Q12H  . sodium chloride flush  10-40 mL Intracatheter Q12H   Continuous Infusions: . ceFEPime (MAXIPIME) IV Stopped (05/18/20 1126)  . dexmedetomidine (PRECEDEX) IV infusion 1 mcg/kg/hr (05/19/20 0800)  . feeding supplement (VITAL HIGH PROTEIN) Stopped (05/18/20 1810)  .  sodium bicarbonate (isotonic) infusion in sterile water 50 mL/hr at 05/19/20 0800  . vancomycin Stopped (05/17/20 1426)   PRN Meds:.acetaminophen **OR**  acetaminophen, fentaNYL (SUBLIMAZE) injection, fentaNYL (SUBLIMAZE) injection, Gerhardt's butt cream, sodium chloride flush, sodium chloride flush   Interim history/subjective:   follows commands/ nods to questions but then nods off on precedex infusion - no apparent specific complaints per nursing / tolerating PSV   Objective   Blood pressure (!) 126/57, pulse 71, temperature 99.7 F (37.6 C), resp. rate (!) 22, height $RemoveBe'5\' 4"'ENHsAtzHW$  (1.626 m), weight 103.3 kg, SpO2 95 %.    Vent Mode: PSV;CPAP FiO2 (%):  [30 %-40 %] 40 % Set Rate:  [22 bmp] 22 bmp Vt Set:  [470 mL] 470 mL PEEP:  [5 cmH20] 5 cmH20 Pressure Support:  [10 cmH20] 10 cmH20 Plateau Pressure:  [19 cmH20-22 cmH20] 20 cmH20   Intake/Output Summary (Last 24 hours) at 05/19/2020 1038 Last data filed at 05/19/2020 0800 Gross per 24 hour  Intake 2054.25 ml  Output 375 ml  Net 1679.25 ml   Filed Weights   05/17/20 0435 05/18/20 0500 05/19/20 0200  Weight: 101 kg 102.7 kg 103.3 kg   Physical Exam: Tmax  101.5  Pt   nad @ 30 degrees hob No jvd Oropharynx et  Neck supple Lungs with a few scattered exp > insp rhonchi bilaterally RRR no s3 or or sign murmur Abd soft/ nl excursion  Extr prevalon boots/ SCDs Neuro  Sensorium sedated but f/c  Resolved Hospital Problem list   DKA Hypercalcemia TLS Massive hemoptysis  Assessment &  Plan:   Acute hypoxic & hypercapnic respiratory failure- multifactorial.  Predominantly now related to lobar pneumonia. Hemoptysis resolved. HAP-persistently febrile overnight, remains leukopenic -Continue PSV wean as tol -Continue broad-spectrum antibiotics > abx per flow sheet/ d/c'd vanc 11/27 with neg mrsa swab and trach cultures   Acute left segmental pulmonary embolism due to malignancy/ bled on heparin   Lab Results  Component Value Date   HGB 7.5 (L) 05/18/2020   HGB 7.6 (L) 05/18/2020   HGB 6.5 (LL) 05/17/2020   HGB 10.8 (L) 02/08/2020   HGB 11.2 (L) 01/13/2020   HGB 10.4 (L)  11/25/2019    Has history of previous need for Coumadin about 20 years ago-unsure if this was an unprovoked VTE in the past -Heparin infusion on hold dueto bleeding after NT suctioning and drop in hemoglobin. Hb has remained stable  since transfusion.    Metabolic acidosis- likely attributable to renal failure  . Metabolic acidosis improved with bicarb infusion. Uric acid slowly rising.   -continue bicarb infusion> had severe worsening in acidosis quickly after it was discontinued previously . -No plans for dialysis; he would be a poor candidate given multiorgan failure and progressive stage IV cancer. Have discussed with mPOA.  Type 2 diabetes with controlled hyperglycemia -SSI as needed -Goal BG 140-180 while admitted to the ICU  Acute kidney injury- likely due to sepsis. Uric acid levels remain normal. FeNa 2.5>  likely ATN from sepsis. Hypernatremia-resolved Lab Results  Component Value Date   CREATININE 4.64 (H) 05/19/2020   CREATININE 4.26 (H) 05/18/2020   CREATININE 3.69 (H) 05/17/2020    -Continue enteral free water to maintain normal Na+ -Continue monitoring renal function daily -Strict I's/O -Renally dose meds and avoid nephrotoxic meds    Pancytopenia due to chemotherapy; failing to recover over the past week. Acute worsening of anemia likely due to hypervolemia, acute on chronic illness, frequent lab draws -Continue to hold heparin -Continue to monitor  Stage IV mantle cell lymphoma with LUQ mass/ extensive metastatic disease Last CHOP 10/30, follows with Dr. Irene Limbo -Appreciate oncology's input.  All future chemo would be with palliative rather than curative intent given progression of his disease.  Unfortunately he would not be a candidate until his functional status improved. -Palliative radiation per radiation oncology  History of tumor lysis syndrome-uric acid level continues increasing likely due to renal dysfunction rather than TLS given lack of  hyperkalemia -Continue daily allopurinol -Continue to monitor  GoC  discussed the patient's care with his friend and medical power of attorney Nicole Kindred and [place] wife.  He had been doing very well over the summer, tolerating a cruise in the [place].  Over the last month prior to admission he had deteriorating functional status, but lives alone and had been continuing to care for himself.  He is an " eternal optimist" and would want to continue aggressive therapy.  He would not want his final days in life to be spent on a ventilator.  They understand my concerns regarding prolonged admission and repeat hospital admission with background progressive cancer despite aggressive therapy.  They are aware that he would require significant rehabilitation prior to being discharged, but feel that he would want to continue aggressive care at this time.  We discussed the potential need for bronchoscopy if his hemoptysis worsens, which they verbally consented to.  They will be out of town for few days but plan to come back on Wednesday. -Power of attorney updated on 11/21 via phone -Updated power of attorney's  wife on 11/23. -In person meeting on 11/24-no plans for dialysis.  Will monitor renal function in the coming days. -11/25> updated paula and Roseanne Reno, not escalating care and code status changed to DNR -11/26> Gunnar Fusi updated over the phone. She is talking to [place] regarding plans moving forward. Liberalized visitation.  Best practice:  Diet: NPO Pain/Anxiety/Delirium protocol (if indicated): PAD protocol / on precedex  VAP protocol (if indicated): Yes DVT prophylaxis: Systemic heparin on hold due to ? Upper airway bleeding GI prophylaxis: PPI Glucose control: CBG q4h Mobility: BR Code Status: Full code Family Communication: no answer at contact number am 11/27 disposition: DNR  Labs   CBC: Recent Labs  Lab 05/14/20 0434 05/14/20 0434 05/15/20 0230 05/16/20 0317 05/17/20 0430  05/18/20 0115 05/18/20 0513  WBC 2.5*  --  3.6* 3.4* 3.1*  --  3.2*  HGB 8.0*   < > 9.1* 7.2* 6.5* 7.6* 7.5*  HCT 24.9*   < > 27.3* 21.4* 18.2* 23.5* 23.3*  MCV 94.7  --  92.2 89.5 89.7  --  92.8  PLT 71*  --  86* 77* 74*  --  75*   < > = values in this interval not displayed.    Basic Metabolic Panel: Recent Labs  Lab 05/15/20 0230 05/15/20 1457 05/16/20 0317 05/16/20 0317 05/16/20 1300 05/17/20 0430 05/17/20 1130 05/18/20 0513 05/19/20 0508  NA 142   < > 142   < > 144 139 143 140 142  K 3.8   < > 3.5   < > 3.5 2.8* 4.1 3.5 3.8  CL 113*   < > 99   < > 105 90* 103 102 100  CO2 12*   < > 25   < > 19* 32 20* 20* 19*  GLUCOSE 119*   < > 130*   < > 132* 139* 150* 158* 181*  BUN 112*   < > 140*   < > 146* 129* 144* 153* 168*  CREATININE 3.35*   < > 3.31*   < > 3.61* 3.34* 3.69* 4.26* 4.64*  CALCIUM 7.3*   < > 6.5*   < > 6.9* 5.9* 7.0* 6.6* 6.5*  MG 2.1  --  1.9  --   --  1.8  --  2.2 2.0  PHOS 6.8*  --  7.4*  --   --  6.0*  --  8.3* 9.3*   < > = values in this interval not displayed.   GFR: Estimated Creatinine Clearance: 16.1 mL/min (A) (by C-G formula based on SCr of 4.64 mg/dL (H)). Recent Labs  Lab 05/15/20 0230 05/15/20 1457 05/16/20 0317 05/17/20 0430 05/18/20 0513  WBC 3.6*  --  3.4* 3.1* 3.2*  LATICACIDVEN  --  1.1  --   --   --     Liver Function Tests: No results for input(s): AST, ALT, ALKPHOS, BILITOT, PROT, ALBUMIN in the last 168 hours. No results for input(s): LIPASE, AMYLASE in the last 168 hours. No results for input(s): AMMONIA in the last 168 hours.  ABG    Component Value Date/Time   PHART 7.374 05/16/2020 0330   PCO2ART 32.0 05/16/2020 0330   PO2ART 79.1 (L) 05/16/2020 0330   HCO3 18.2 (L) 05/16/2020 0330   TCO2 29 04/26/2020 2146   ACIDBASEDEF 5.8 (H) 05/16/2020 0330   O2SAT 95.8 05/16/2020 0330     Coagulation Profile: No results for input(s): INR, PROTIME in the last 168 hours.  Cardiac Enzymes: No results for input(s): CKTOTAL,  CKMB, CKMBINDEX, TROPONINI  in the last 168 hours.  HbA1C: Hgb A1c MFr Bld  Date/Time Value Ref Range Status  05/20/2020 05:49 PM 6.3 (H) 4.8 - 5.6 % Final    Comment:    (NOTE) Pre diabetes:          5.7%-6.4%  Diabetes:              >6.4%  Glycemic control for   <7.0% adults with diabetes   11/14/2019 06:13 AM 5.4 4.8 - 5.6 % Final    Comment:    (NOTE) Pre diabetes:          5.7%-6.4% Diabetes:              >6.4% Glycemic control for   <7.0% adults with diabetes     CBG: Recent Labs  Lab 05/17/20 1613 05/17/20 1954 05/18/20 0007 05/18/20 0757 05/18/20 1206  GLUCAP 148* 112* 150* 101* 153*    The patient is critically ill with multiple organ systems failure and requires high complexity decision making for assessment and support, frequent evaluation and titration of therapies, application of advanced monitoring technologies and extensive interpretation of multiple databases. Critical Care Time devoted to patient care services described in this note is 35 minutes.    Christinia Gully, MD Pulmonary and Section [phone]   After 7:00 pm call Elink  [phone]
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
## 2020-05-20 DIAGNOSIS — D61810 Antineoplastic chemotherapy induced pancytopenia: Secondary | ICD-10-CM | POA: Diagnosis not present

## 2020-05-20 DIAGNOSIS — J9601 Acute respiratory failure with hypoxia: Secondary | ICD-10-CM | POA: Diagnosis not present

## 2020-05-20 DIAGNOSIS — E870 Hyperosmolality and hypernatremia: Secondary | ICD-10-CM | POA: Diagnosis not present

## 2020-05-20 DIAGNOSIS — N179 Acute kidney failure, unspecified: Secondary | ICD-10-CM | POA: Diagnosis not present

## 2020-05-20 LAB — BASIC METABOLIC PANEL
Anion gap: >20 — ABNORMAL HIGH (ref 5–15)
CO2: 19 mmol/L — ABNORMAL LOW (ref 22–32)
Calcium: 6.6 mg/dL — ABNORMAL LOW (ref 8.9–10.3)
Chloride: 100 mmol/L (ref 98–111)
Creatinine, Ser: 4.88 mg/dL — ABNORMAL HIGH (ref 0.61–1.24)
GFR, Estimated: 12 mL/min — ABNORMAL LOW (ref >60)
Glucose, Bld: 161 mg/dL — ABNORMAL HIGH (ref 70–99)
Potassium: 3.9 mmol/L (ref 3.5–5.1)
Sodium: 143 mmol/L (ref 135–145)

## 2020-05-20 LAB — MAGNESIUM: Magnesium: 2 mg/dL (ref 1.7–2.4)

## 2020-05-20 LAB — PHOSPHORUS: Phosphorus: 10.2 mg/dL — ABNORMAL HIGH (ref 2.5–4.6)

## 2020-05-20 MED ORDER — FENTANYL 2500MCG IN NS 250ML (10MCG/ML) PREMIX INFUSION
0.0000 ug/h | INTRAVENOUS | Status: DC
  Administered 2020-05-20: 25 ug/h via INTRAVENOUS
  Administered 2020-05-21: 125 ug/h via INTRAVENOUS
  Administered 2020-05-21: 50 ug/h via INTRAVENOUS
  Filled 2020-05-20 (×2): qty 250

## 2020-05-20 MED ORDER — PANTOPRAZOLE SODIUM 40 MG PO PACK: 40.0000 mg | PACK | Freq: Every day | ORAL | Status: DC

## 2020-05-20 MED ORDER — DEXMEDETOMIDINE HCL IN NACL 200 MCG/50ML IV SOLN
0.4000 ug/kg/h | INTRAVENOUS | Status: DC
  Administered 2020-05-21: 1.2 ug/kg/h via INTRAVENOUS
  Administered 2020-05-21 (×2): 1 ug/kg/h via INTRAVENOUS
  Administered 2020-05-21: 0.8 ug/kg/h via INTRAVENOUS
  Administered 2020-05-21 (×2): 1 ug/kg/h via INTRAVENOUS
  Administered 2020-05-21: 0.8 ug/kg/h via INTRAVENOUS
  Filled 2020-05-20 (×3): qty 50
  Filled 2020-05-20: qty 100
  Filled 2020-05-20 (×2): qty 50
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
## 2020-05-20 NOTE — Progress Notes (Addendum)
NAME:  Dean Dixon, MRN:  [id number], DOB:  Oct 30, 1950, LOS: 26 ADMISSION DATE:  04/25/2020, CONSULTATION DATE:  11/7/201 REFERRING MD:  [facility], CHIEF COMPLAINT:  Respiratory distress  Brief History   69 year old male with stage IV mantle cell lymphoma on CHOP presenting with N/V and progressive SOB x  2 weeks PTA found to have acute left segmental PE, intubated 11/7-11/9 in setting hypertensive emergency/flash, reintubated 11/20 for multifactorial acute hypercapnic hypoxic respiratory failure   History of present illness   HPI obtained from medical chart review as patient is currently intubated and sedated on mechanical ventilation.  69 year old male with prior medical history of stage IV mantle cell lymphoma (diagnosed 06/20/2019) on CHOP, hypertension, diabetes type 2 who presented to [facility] on 11/7 with progressive shortness of breath over the last 2 weeks, worse over the last 2 days with above baseline abdominal distention, non bloody nausea/ vomiting, nonradiating intermittent midsternal chest tightness, and cough with lightly productive greenish nonbloody sputum.  Additionally reported ongoing swelling in his legs and testicles causing difficulty with urination.  Recent hospitalization 10/28 - 11/1 found to have new left upper quadrant abdominal mass and hypercalcemia.    In ER, he was afebrile, hypertensive, mildly tachypneic, and tachycardic with oxygen saturations of 98% on room air.  Noted to have multiple episodes of vomiting and retching treated with zofran.  KUB with non-obstructive gas pattern.   Labs noted for glucose 290 with HgbA1c 6.3, BUN 40, AG 17, lactic acid 3.2-> 2.3, troponin hs 52, BNP 133, D-dimer 2.47, Hgb 11.5/ HCT 35.5, platelets 100, neg COVID/ flu, UA noted turbid urine with 5 ketones, negative for leukocytes/ nitrates.  CXR showed small bilateral pleural effusion with atelectasis and low lung volumes.  He was pretreated given his contrast allergy. CTA PE and CT abd/  pelvis showed segmental nonocclusive left pulmonary embolism without associated hemorrhage or pulmonary infarct.  Additionally noted bulky soft tissue mass in LUQ with extensive lymphadenopathy in the chest and abd with evidence of widespread intraperitoneal metastatic disease, small bilateral pleural effusion, and  tracheobronchomalacia. Started on heparin gtt per pharmacy.   He was admitted by [facility] to progressive care.  In unit, he was noted to become hypertensive with BP 250/100 with complaints of chest discomfort with noted tachypnea and lethargy.  He was treated with hydralazine and placed on nitro gtt for blood pressure management.  Repeated EKG was non acute.  ABG on NRB noted 7.291/ 56.7/ 275.  Bipap was attempted however patient kept pulling it off and therefore given concern for impending respiratory failure, he was intubated by anesthesia.  PCCM consulted for further management.   Past Medical History  Mantle cell lymphoma on CHOP, DM, HTN  Significant Hospital Events   11/07 Admitted to [facility]/ decompensated/ intubated 11/08 Weaned on PSV, lasix with 2.2L UOP  11/09 Extubated  11/11 Fever / leukocytosis improved, on abx  11/12 Mental status improved, on RA, palliative radiation  11/20 Reintubated for acute hypercapnic hypoxic respiratory failure  Consults:  PCCM 11/7, 11/20 Palliative Care Oncology  Procedures:  ETT 11/7 > 11/9 TLC R IJ 11/8 > 11/12 ETT 11/19 >>11/23, 11/23>>> PICC 11/16 >>>  Significant Diagnostic Tests:  11/7 KUB >> nonobstructive gas pattern 11/7 CTA PE/  CT abd/ pelvis >> Study is positive for segmental sized pulmonary emboli in the left lung. These appear nonocclusive at this time, and there is no associated pulmonary hemorrhage to suggest frank pulmonary infarct. Bulky soft tissue mass in the left  upper quadrant with extensive lymphadenopathy in the chest and abdomen, and evidence of widespread intraperitoneal metastatic disease. Small bilateral pleural  effusions lying dependently. Aortic atherosclerosis.  Tracheobronchomalacia. Cholelithiasis. Colonic diverticulosis. 11/8 [place] Ascites/ABD >> small volume ascites, insufficient for paracentesis, extensive omental caking and peritoneal nodularity consistent with peritoneal carcinomatosis  11/8 ECHO >> cystic structure noted in liver on subcostal images, LVEF 60-65%, no RWMA, RV systolic function normal, pericardial effusion is posterior to LV 11/8 LE Doppler >> negative for DVT 11/19 CXR bilateral effusions, atelectasis  Micro Data:  SARS 2/ flu PCR 11/7 >> neg MRSA PCR  11/7 >> neg UA 11/7 >> negative  BCx2 11/9 >> neg MRSA PCR  11/21  Neg  Trach aspirate 11/21>GPC> normal flora Trach aspirate 11/25> abundant WBC, rare GPC  >>> nl flora  Antimicrobials:  Previously on azithro/ ceftriaxone, stopped 10/30 Cefepime 11/8 >> 11/14; 11/21-22   Vanc 11/21-22, 11/25 >>>11/27 Ceftriaxone 11/23- 11/24 Cefepime  Restarted  11/25>>>   Scheduled Meds: . allopurinol  100 mg Per Tube BID  . amLODipine  10 mg Per Tube Daily  . chlorhexidine gluconate (MEDLINE KIT)  15 mL Mouth Rinse BID  . Chlorhexidine Gluconate Cloth  6 each Topical Daily  . docusate  100 mg Per Tube BID  . feeding supplement (PROSource TF)  45 mL Per Tube BID  . free water  200 mL Per Tube Q4H  . mouth rinse  15 mL Mouth Rinse 10 times per day  . multivitamin  15 mL Per Tube Daily  . pantoprazole (PROTONIX) IV  40 mg Intravenous Q12H  . polyethylene glycol  17 g Per Tube Daily  . sodium chloride flush  10-40 mL Intracatheter Q12H  . sodium chloride flush  10-40 mL Intracatheter Q12H   Continuous Infusions: . ceFEPime (MAXIPIME) IV Stopped (05/19/20 1151)  . dexmedetomidine (PRECEDEX) IV infusion 1.1 mcg/kg/hr (05/20/20 0714)  . feeding supplement (VITAL HIGH PROTEIN) Stopped (05/18/20 1810)  .  sodium bicarbonate (isotonic) infusion in sterile water 50 mL/hr at 05/20/20 0714  . vancomycin Stopped (05/19/20 1407)   PRN  Meds:.acetaminophen **OR** acetaminophen, fentaNYL (SUBLIMAZE) injection, fentaNYL (SUBLIMAZE) injection, Gerhardt's butt cream, sodium chloride flush, sodium chloride flush   Interim history/subjective:  On precedex at 1.2 and uncomfortable s specific source so rx fentanyl 100 mcg IV and sleeping on my arrival and not breathing over back up rate  Objective   Blood pressure 112/62, pulse 73, temperature (!) 100.4 F (38 C), resp. rate (!) 22, height $RemoveBe'5\' 4"'EhfEYNZqP$  (1.626 m), weight 103.3 kg, SpO2 93 %.    Vent Mode: PRVC FiO2 (%):  [30 %-40 %] 30 % Set Rate:  [22 bmp] 22 bmp Vt Set:  [470 mL] 470 mL PEEP:  [5 cmH20] 5 cmH20 Pressure Support:  [10 cmH20] 10 cmH20 Plateau Pressure:  [18 cmH20-21 cmH20] 20 cmH20   Intake/Output Summary (Last 24 hours) at 05/20/2020 0918 Last data filed at 05/20/2020 3244 Gross per 24 hour  Intake 1711.71 ml  Output 555 ml  Net 1156.71 ml   Filed Weights   05/17/20 0435 05/18/20 0500 05/19/20 0200  Weight: 101 kg 102.7 kg 103.3 kg   Physical Exam: Tmax   100.6 slt trend down  Pt sedated not breathing over back up PRVC rate  No jvd Oropharynx  Et  Neck supple Lungs with a few scattered exp > insp rhonchi bilaterally RRR no s3 or or sign murmur Abd obese with limited excursion  Extr warm with no edema or clubbing noted  Resolved Hospital Problem list   DKA Hypercalcemia TLS Massive hemoptysis  Assessment & Plan:   Acute hypoxic & hypercapnic respiratory failure- multifactorial.  Predominantly now related to lobar pneumonia. Hemoptysis resolved. HAP-persistently febrile overnight, remains leukopenic -Continue PSV wean as tol -Continue broad-spectrum antibiotics > abx per flow sheet/ d/c'd vanc 11/27 with neg mrsa swab and trach cultures  - start low dose fentanyl when the bolus fentanyl wears off  in anticipation of wean to extubate am 11/29  Acute left segmental pulmonary embolism due to malignancy/ bled on heparin - venous dopplers neg  11/8   Lab Results  Component Value Date   HGB 7.5 (L) 05/18/2020   HGB 7.6 (L) 05/18/2020   HGB 6.5 (LL) 05/17/2020   HGB 10.8 (L) 02/08/2020   HGB 11.2 (L) 01/13/2020   HGB 10.4 (L) 11/25/2019    Has history of previous need for Coumadin about 20 years ago-unsure if this was an unprovoked VTE in the past -Heparin infusion on hold due to bleeding after NT suctioning and drop in hemoglobin. Hb has remained stable  since transfusion.    Metabolic acidosis- likely attributable to renal failure  Metabolic acidosis improved p bicarb infusion. Uric acid slowly rising.   -continue bicarb infusion> had severe worsening in acidosis quickly after it was discontinued previously . -No plans for dialysis; he would be a poor candidate given multiorgan failure and progressive stage IV cancer. Have discussed with mPOA.  Type 2 diabetes with controlled hyperglycemia -SSI as needed -Goal BG 140-180 while admitted to the ICU  Acute kidney injury- likely due to sepsis. Uric acid levels remain normal. FeNa 2.5>  likely ATN from sepsis. Hypernatremia-resolved  Lab Results  Component Value Date   CREATININE 4.88 (H) 05/20/2020   CREATININE 4.64 (H) 05/19/2020   CREATININE 4.26 (H) 05/18/2020    -Continue enteral free water to maintain normal Na+ -Continue monitoring renal function daily -Strict I's/O -Renally dose meds and avoid nephrotoxic meds      Pancytopenia due to chemotherapy; failing to recover over the past week.  Lab Results  Component Value Date   HGB 7.5 (L) 05/18/2020   HGB 7.6 (L) 05/18/2020   HGB 6.5 (LL) 05/17/2020   HGB 10.8 (L) 02/08/2020   HGB 11.2 (L) 01/13/2020   HGB 10.4 (L) 11/25/2019   Acute worsening of anemia likely due to hypervolemia, acute on chronic illness, frequent lab draws -Continue to hold heparin -Continue to monitor  Stage IV mantle cell lymphoma with LUQ mass/ extensive metastatic disease Last CHOP 10/30, follows with Dr. Irene Limbo -Appreciate  oncology's input.  All future chemo would be with palliative rather than curative intent given progression of his disease.  Unfortunately he would not be a candidate until his functional status improved. -Palliative radiation per radiation oncology  History of tumor lysis syndrome-uric acid level continues increasing likely due to renal dysfunction rather than TLS given lack of hyperkalemia -Continue daily allopurinol -Continue to monitor  GoC  discussed the patient's care with his friend and medical power of attorney Nicole Kindred and [place] wife.  He had been doing very well over the summer, tolerating a cruise in the [place].  Over the last month prior to admission he had deteriorating functional status, but lives alone and had been continuing to care for himself.  He is an " eternal optimist" and would want to continue aggressive therapy.  He would not want his final days in life to be spent on a ventilator.  They understand my concerns  regarding prolonged admission and repeat hospital admission with background progressive cancer despite aggressive therapy.  They are aware that he would require significant rehabilitation prior to being discharged, but feel that he would want to continue aggressive care at this time.  We discussed the potential need for bronchoscopy if his hemoptysis worsens, which they verbally consented to.  They will be out of town for few days but plan to come back on Wednesday. -Power of attorney updated on 11/21 via phone -Updated power of attorney's wife on 11/23. -In person meeting on 11/24-no plans for dialysis.  Will monitor renal function in the coming days. -11/25> updated paula and Nicole Kindred, not escalating care and code status changed to DNR -11/26> [name] updated over the phone. She is talking to [place] regarding plans moving forward. Liberalized visitation. 11/27 > discussed with Nicole Kindred and [name], planning to come in am 11/29 for one way extubation  Best  practice:  Diet: NPO Pain/Anxiety/Delirium protocol (if indicated): PAD protocol / on precedex  VAP protocol (if indicated): Yes DVT prophylaxis: Systemic heparin on hold due to ? Upper airway bleeding GI prophylaxis: PPI Glucose control: CBG q4h Mobility: BR Code Status: DNR  Family Communication: with POA 11/27     Labs   CBC: Recent Labs  Lab 05/14/20 0434 05/14/20 0434 05/15/20 0230 05/16/20 0317 05/17/20 0430 05/18/20 0115 05/18/20 0513  WBC 2.5*  --  3.6* 3.4* 3.1*  --  3.2*  HGB 8.0*   < > 9.1* 7.2* 6.5* 7.6* 7.5*  HCT 24.9*   < > 27.3* 21.4* 18.2* 23.5* 23.3*  MCV 94.7  --  92.2 89.5 89.7  --  92.8  PLT 71*  --  86* 77* 74*  --  75*   < > = values in this interval not displayed.    Basic Metabolic Panel: Recent Labs  Lab 05/16/20 0317 05/16/20 1300 05/17/20 0430 05/17/20 1130 05/18/20 0513 05/19/20 0508 05/20/20 0547  NA 142   < > 139 143 140 142 143  K 3.5   < > 2.8* 4.1 3.5 3.8 3.9  CL 99   < > 90* 103 102 100 100  CO2 25   < > 32 20* 20* 19* 19*  GLUCOSE 130*   < > 139* 150* 158* 181* 161*  BUN 140*   < > 129* 144* 153* 168* Null_Result  CREATININE 3.31*   < > 3.34* 3.69* 4.26* 4.64* 4.88*  CALCIUM 6.5*   < > 5.9* 7.0* 6.6* 6.5* 6.6*  MG 1.9  --  1.8  --  2.2 2.0 2.0  PHOS 7.4*  --  6.0*  --  8.3* 9.3* 10.2*   < > = values in this interval not displayed.   GFR: Estimated Creatinine Clearance: 15.3 mL/min (A) (by C-G formula based on SCr of 4.88 mg/dL (H)). Recent Labs  Lab 05/15/20 0230 05/15/20 1457 05/16/20 0317 05/17/20 0430 05/18/20 0513  WBC 3.6*  --  3.4* 3.1* 3.2*  LATICACIDVEN  --  1.1  --   --   --     Liver Function Tests: No results for input(s): AST, ALT, ALKPHOS, BILITOT, PROT, ALBUMIN in the last 168 hours. No results for input(s): LIPASE, AMYLASE in the last 168 hours. No results for input(s): AMMONIA in the last 168 hours.  ABG    Component Value Date/Time   PHART 7.374 05/16/2020 0330   PCO2ART 32.0 05/16/2020 0330    PO2ART 79.1 (L) 05/16/2020 0330   HCO3 18.2 (L) 05/16/2020 0330  TCO2 29 05/04/2020 2146   ACIDBASEDEF 5.8 (H) 05/16/2020 0330   O2SAT 95.8 05/16/2020 0330     Coagulation Profile: No results for input(s): INR, PROTIME in the last 168 hours.  Cardiac Enzymes: No results for input(s): CKTOTAL, CKMB, CKMBINDEX, TROPONINI in the last 168 hours.  HbA1C: Hgb A1c MFr Bld  Date/Time Value Ref Range Status  05/06/2020 05:49 PM 6.3 (H) 4.8 - 5.6 % Final    Comment:    (NOTE) Pre diabetes:          5.7%-6.4%  Diabetes:              >6.4%  Glycemic control for   <7.0% adults with diabetes   11/14/2019 06:13 AM 5.4 4.8 - 5.6 % Final    Comment:    (NOTE) Pre diabetes:          5.7%-6.4% Diabetes:              >6.4% Glycemic control for   <7.0% adults with diabetes     CBG: Recent Labs  Lab 05/17/20 1613 05/17/20 1954 05/18/20 0007 05/18/20 0757 05/18/20 1206  GLUCAP 148* 112* 150* 101* 153*      The patient is critically ill with multiple organ systems failure and requires high complexity decision making for assessment and support, frequent evaluation and titration of therapies, application of advanced monitoring technologies and extensive interpretation of multiple databases. Critical Care Time devoted to patient care services described in this note is 35 minutes.    Christinia Gully, MD Pulmonary and [place] [phone]   After 7:00 pm call Elink  [phone]

## 2020-05-20 NOTE — Progress Notes (Signed)
[facility] Progress Note Patient Name: Dean Dixon DOB: 02/24/1951 MRN: [id number]   Date of Service  05/20/2020  HPI/Events of Note  RN requests reorder of Precedex drip.  eICU Interventions  Order re-entered.     Intervention Category Minor Interventions: Routine modifications to care plan (e.g. PRN medications for pain, fever)  Marily Lente Merinda Victorino 05/20/2020, 7:58 PM

## 2020-05-21 ENCOUNTER — Ambulatory Visit: Payer: Medicare Other

## 2020-05-21 DIAGNOSIS — I2699 Other pulmonary embolism without acute cor pulmonale: Secondary | ICD-10-CM | POA: Diagnosis not present

## 2020-05-21 DIAGNOSIS — N179 Acute kidney failure, unspecified: Secondary | ICD-10-CM | POA: Diagnosis not present

## 2020-05-21 DIAGNOSIS — J9601 Acute respiratory failure with hypoxia: Secondary | ICD-10-CM | POA: Diagnosis not present

## 2020-05-21 DIAGNOSIS — C8310 Mantle cell lymphoma, unspecified site: Secondary | ICD-10-CM | POA: Diagnosis not present

## 2020-05-21 DIAGNOSIS — G9341 Metabolic encephalopathy: Secondary | ICD-10-CM

## 2020-05-21 LAB — BASIC METABOLIC PANEL
Anion gap: 24 — ABNORMAL HIGH (ref 5–15)
BUN: 180 mg/dL — ABNORMAL HIGH (ref 8–23)
CO2: 22 mmol/L (ref 22–32)
Calcium: 6.4 mg/dL — CL (ref 8.9–10.3)
Chloride: 99 mmol/L (ref 98–111)
Creatinine, Ser: 5.07 mg/dL — ABNORMAL HIGH (ref 0.61–1.24)
GFR, Estimated: 12 mL/min — ABNORMAL LOW (ref >60)
Glucose, Bld: 160 mg/dL — ABNORMAL HIGH (ref 70–99)
Potassium: 3.7 mmol/L (ref 3.5–5.1)
Sodium: 145 mmol/L (ref 135–145)

## 2020-05-21 LAB — CBC
HCT: 23 % — ABNORMAL LOW (ref 39.0–52.0)
Hemoglobin: 7.6 g/dL — ABNORMAL LOW (ref 13.0–17.0)
MCH: 30.3 pg (ref 26.0–34.0)
MCHC: 33 g/dL (ref 30.0–36.0)
MCV: 91.6 fL (ref 80.0–100.0)
Platelets: 61 10*3/uL — ABNORMAL LOW (ref 150–400)
RBC: 2.51 MIL/uL — ABNORMAL LOW (ref 4.22–5.81)
RDW: 17.4 % — ABNORMAL HIGH (ref 11.5–15.5)
WBC: 5.2 10*3/uL (ref 4.0–10.5)
nRBC: 0 % (ref 0.0–0.2)

## 2020-05-21 MED ORDER — DIPHENHYDRAMINE HCL 50 MG/ML IJ SOLN: 25.0000 mg | INTRAMUSCULAR | Status: DC | PRN

## 2020-05-21 MED ORDER — DEXTROSE 5 % IV SOLN: INTRAVENOUS | Status: DC

## 2020-05-21 MED ORDER — GLYCOPYRROLATE 0.2 MG/ML IJ SOLN
0.2000 mg | INTRAMUSCULAR | Status: DC | PRN
  Administered 2020-05-21: 0.2 mg via INTRAVENOUS
  Filled 2020-05-21: qty 1

## 2020-05-21 MED ORDER — FENTANYL 2500MCG IN NS 250ML (10MCG/ML) PREMIX INFUSION: 0.0000 ug/h | INTRAVENOUS | Status: DC

## 2020-05-21 MED ORDER — MORPHINE SULFATE (PF) 2 MG/ML IV SOLN
INTRAVENOUS | Status: AC
  Filled 2020-05-21: qty 1

## 2020-05-21 MED ORDER — FENTANYL BOLUS VIA INFUSION
100.0000 ug | INTRAVENOUS | Status: DC | PRN
  Administered 2020-05-21 (×4): 100 ug via INTRAVENOUS
  Filled 2020-05-21: qty 100

## 2020-05-21 MED ORDER — SODIUM CHLORIDE 0.9 % IV SOLN
0.5000 mg/h | INTRAVENOUS | Status: DC
  Administered 2020-05-21: 0.5 mg/h via INTRAVENOUS
  Filled 2020-05-21 (×2): qty 2.5

## 2020-05-21 MED ORDER — FENTANYL CITRATE (PF) 100 MCG/2ML IJ SOLN: 50.0000 ug | INTRAMUSCULAR | Status: DC | PRN

## 2020-05-21 MED ORDER — GLYCOPYRROLATE 1 MG PO TABS: 1.0000 mg | ORAL_TABLET | ORAL | Status: DC | PRN

## 2020-05-21 MED ORDER — POLYVINYL ALCOHOL 1.4 % OP SOLN
1.0000 [drp] | Freq: Four times a day (QID) | OPHTHALMIC | Status: DC | PRN
  Filled 2020-05-21: qty 15

## 2020-05-21 MED ORDER — GLYCOPYRROLATE 0.2 MG/ML IJ SOLN
0.2000 mg | INTRAMUSCULAR | Status: DC | PRN
  Administered 2020-05-21: 0.2 mg via SUBCUTANEOUS
  Filled 2020-05-21: qty 1

## 2020-05-21 MED ORDER — HYDROMORPHONE HCL 1 MG/ML IJ SOLN
0.5000 mg | INTRAMUSCULAR | Status: DC | PRN
  Administered 2020-05-21: 1 mg via INTRAVENOUS
  Filled 2020-05-21: qty 1

## 2020-05-22 NOTE — TOC Transition Note (Signed)
Transition of Care [facility]) - CM/SW Discharge Note   Patient Details  Name: Christoffer Currier MRN: [id number] Date of Birth: [birth year]  Transition of Care [facility]) CM/SW Contact:  Leeroy Cha, RN Phone Number: 05/20/2020, 8:37 AM   Clinical Narrative:    [phone] extubation wth family in room, pt expired   Final next level of care: Expired Barriers to Discharge: No Barriers Identified   Patient Goals and CMS Choice Patient states their goals for this hospitalization and ongoing recovery are:: to go back home CMS Medicare.gov Compare Post Acute Care list provided to:: Patient    Discharge Placement                       Discharge Plan and Services   Discharge Planning Services: CM Consult                                 Social Determinants of Health (SDOH) Interventions     Readmission Risk Interventions No flowsheet data found.

## 2020-05-23 NOTE — TOC Progression Note (Signed)
Transition of Care [facility]) - Progression Note    Patient Details  Name: Dean Dixon MRN: [id number] Date of Birth: 03-27-1951  Transition of Care [facility]) CM/SW Contact  Leeroy Cha, RN Phone Number: 2020-06-16, 9:24 AM  Clinical Narrative:    [facility] Events   11/07 Admitted to [place] decompensated/ intubated 11/08 Weaned on PSV, lasix with 2.2L UOP  11/09 Extubated  11/11 Fever / leukocytosis improved, on abx  11/12 Mental status improved, on RA, palliative radiation  11/20 Reintubated for acute hypercapnic hypoxic respiratory failure Plan tbd Following for progression   Expected Discharge Plan: Home/Self Care Barriers to Discharge: No Barriers Identified  Expected Discharge Plan and Services Expected Discharge Plan: Home/Self Care   Discharge Planning Services: CM Consult   Living arrangements for the past 2 months: Single Family Home                                       Social Determinants of Health (SDOH) Interventions    Readmission Risk Interventions No flowsheet data found.

## 2020-05-23 NOTE — Death Summary Note (Signed)
DEATH SUMMARY   Patient Details  Name: Dean Dixon MRN: [id number] DOB: 02-Nov-1950  Admission/Discharge Information   Admit Date:  05/06/20  Date of Death: Date of Death: 28-May-2020  Time of Death: Time of Death: 18-Sep-1941  Length of Stay: 20-Sep-2022  Referring Physician: Leonides Sake, MD   Reason(s) for Hospitalization  Shortness of breath  Diagnoses  Preliminary cause of death: Mantle cell Lymphoma Secondary Diagnoses (including complications and co-morbidities):  Active Problems:   Mantle cell lymphoma (HCC)   Acute on chronic respiratory failure with hypoxia (HCC)   Pulmonary embolism (HCC)   Shortness of breath   Dyspnea and respiratory abnormalities   Intubation of airway performed without difficulty   Pressure injury of skin   Abdominal distension   Tumor lysis syndrome   Antineoplastic chemotherapy induced pancytopenia (HCC)   Tumor lysis syndrome following antineoplastic drug therapy   Neutropenia with fever (HCC)   AKI (acute kidney injury) (HCC)   Hypernatremia   SOB (shortness of breath)   Lower abdominal pain   Acute respiratory failure with hypoxia and hypercapnia [facility])   Brief Hospital Course (including significant findings, care, treatment, and services provided and events leading to death)  Dean Dixon was a 69 y.o. year old male with stage IV mantle cell lymphoma on CHOP who presented on 06-May-2020 with progressive dyspnea, nausea and vomiting for two weeks prior to admission. On admission found to have acute pulmonary embolism and required intubation for hypoxemia with acute pulmonary edema. Hospital course was complicated by tumor lysis syndrome, hypercalcemia of  Malignancy, extubation requiring reintubation and hospital acquired pneumonia. He further developed subsequent renal failure and diffuse anasarca. Despite all aggressive measures the patient continued to decline and felt to be secondary to progression of his disease. Goals of care were discussed the  patient's care with his friend and medical power of attorney Dean Dixon and [place] wife.  He had been doing very well over the summer, tolerating a cruise in the [place].  Over the last month prior to admission he had deteriorating functional status, but lives alone and had been continuing to care for himself.  He is an " eternal optimist" and would want to continue aggressive therapy.  He would not want his final days in life to be spent on a ventilator.  After a prolonged hospital stay he underwent compassionate extubation and underwent comfort measures. He passed away peacefully with friends at bedside.   Pertinent Labs and Studies  Significant Diagnostic Studies DG Chest 1 View  Result Date: 05-06-20 CLINICAL DATA:  Elevated blood pressure. EXAM: CHEST  1 VIEW COMPARISON:  05/06/2020 FINDINGS: The lung volumes are low. There are small bilateral pleural effusions with adjacent atelectasis. The heart size is mildly enlarged. The lung volumes are low. There is no pneumothorax. IMPRESSION: 1. Low lung volumes. 2. Small bilateral pleural effusions with adjacent atelectasis. Electronically Signed   By: Constance Holster M.D.   On: 2020-05-06 20:42   DG Chest 2 View  Result Date: 2020-05-06 CLINICAL DATA:  Shortness of breath lasting several days. Groin swelling and redness. EXAM: CHEST - 2 VIEW COMPARISON:  Chest CT 04/19/20 FINDINGS: Stable cardiomediastinal contours. Low lung volumes. Unchanged small bilateral pleural effusions. No airspace densities. IMPRESSION: Low lung volumes with persistent small bilateral pleural effusions. Electronically Signed   By: Kerby Moors M.D.   On: 2020-05-06 05:09   DG Abd 1 View  Result Date: 05/15/2020 CLINICAL DATA:  OG tube placement. EXAM: ABDOMEN - 1 VIEW  COMPARISON:  X-ray abdomen 05/13/2020. FINDINGS: Enteric tube noted coursing below diaphragm with tip and side port overlying the expected region of the gastric lumen/pyloric region. The bowel  gas pattern is normal. No radio-opaque calculi or other significant radiographic abnormality are seen. Multilevel degenerative changes of the spine. IMPRESSION: Enteric tube coursing below the diaphragm with tip and side port overlying the expected region of the gastric lumen/pyloric region. Electronically Signed   By: Iven Finn M.D.   On: 05/15/2020 15:13   DG Abd 1 View  Result Date: 05/13/2020 CLINICAL DATA:  NG tube advancement EXAM: ABDOMEN - 1 VIEW COMPARISON:  Chest x-ray from earlier today FINDINGS: The distal tip of the NG tube is in the proximal duodenum. Recommend withdrawing 4 cm. IMPRESSION: The distal tip of the NG tube is in the proximal duodenum. Recommend withdrawing 4 cm. Electronically Signed   By: Dorise Bullion III M.D   On: 05/13/2020 15:40   DG Abd 1 View  Result Date: 05/12/2020 CLINICAL DATA:  Initial evaluation for enteric tube placement. EXAM: ABDOMEN - 1 VIEW COMPARISON:  None. FINDINGS: Enteric tube in place with tip overlying the region of the distal stomach/pylorus. Side hole well beyond the GE junction. Tip projects distally. Visualized bowel gas pattern is nonobstructive. Bilateral pleural effusions with associated bibasilar opacities noted within the visualized lungs, better evaluated on concomitant chest radiograph. IMPRESSION: Enteric tube in place with tip overlying the region of the distal stomach/pylorus. Side hole well beyond the GE junction. Tip projects distally. Electronically Signed   By: Jeannine Boga M.D.   On: 05/12/2020 03:58   DG Abd 1 View  Result Date: 05/11/2020 CLINICAL DATA:  Abdominal distension.  Mantle cell lymphoma. EXAM: ABDOMEN - 1 VIEW COMPARISON:  CT of the abdomen and pelvis 05/02/2020 FINDINGS: Stomach is displaced medially secondary to known mass lesion. There is some contrast in the distal small bowel and scattered through the colon, likely secondary to swallowing study 05/04/2020. Lung bases are clear. IMPRESSION: 1.  Medial displacement of the stomach secondary to known mass lesion. 2. No acute findings. Electronically Signed   By: San Morelle M.D.   On: 05/11/2020 14:33   DG Abd 1 View  Result Date: 05/06/2020 CLINICAL DATA:  Tube placement EXAM: ABDOMEN - 1 VIEW COMPARISON:  April 29, 2020 FINDINGS: The enteric tube projects over the gastric antrum/pylorus. The tube is pointed distally. The visualized bowel gas pattern is unremarkable. IMPRESSION: The enteric tube projects over the gastric antrum/pylorus. Electronically Signed   By: Constance Holster M.D.   On: 05/17/2020 22:05   CT Angio Chest PE W and/or Wo Contrast  Result Date: 04/27/2020 CLINICAL DATA:  69 year old male with history of shortness of breath for the past few days. Abdominal distension. History of mantle cell lymphoma. EXAM: CT ANGIOGRAPHY CHEST CT ABDOMEN AND PELVIS WITH CONTRAST TECHNIQUE: Multidetector CT imaging of the chest was performed using the standard protocol during bolus administration of intravenous contrast. Multiplanar CT image reconstructions and MIPs were obtained to evaluate the vascular anatomy. Multidetector CT imaging of the abdomen and pelvis was performed using the standard protocol during bolus administration of intravenous contrast. CONTRAST:  153mL OMNIPAQUE IOHEXOL 350 MG/ML SOLN COMPARISON:  CT the abdomen and pelvis 04/19/2020. CT the chest 04/19/2020. FINDINGS: CTA CHEST FINDINGS Cardiovascular: Study is limited by patient respiratory motion. However, despite this limitation there are segmental sized filling defects within pulmonary arteries to the left upper and lower lobes best appreciated on axial images 73 and 106  of series 5, and axial image 148 of series 5 respectively, compatible with pulmonary embolism. Heart size is normal. There is no significant pericardial fluid, thickening or pericardial calcification. Aortic atherosclerosis. No definite coronary artery calcifications. Mediastinum/Nodes: Multiple  enlarged anterior mediastinal lymph nodes noted inferiorly, immediately above the right hemidiaphragm, measuring up to 2.3 cm in short axis (axial image 13 of series 11), increased compared to the prior study. Prominent but nonenlarged distal paraesophageal lymph node measuring 8 mm in short axis, nonspecific. Circumferential thickening of the distal third of the esophagus. Mildly enlarged left axillary lymph nodes measuring up to 1.3 cm in short axis. Lungs/Pleura: Collapse of the trachea and mainstem bronchi during expiration, indicative of tracheobronchomalacia. No acute consolidative airspace disease. Small bilateral pleural effusions lying dependently. No definite suspicious appearing pulmonary nodules or masses are noted. Musculoskeletal: There are no aggressive appearing lytic or blastic lesions noted in the visualized portions of the skeleton. Review of the MIP images confirms the above findings. CT ABDOMEN and PELVIS FINDINGS Hepatobiliary: Multiple well-defined low-attenuation lesions are again noted throughout the liver, similar in size, number and distribution to the prior study, largest of which are compatible with simple cysts measuring up to 5.3 x 3.7 cm in segment 2 and for a (axial image 18 of series 11). Smaller lesions are too small to definitively characterize, but also favored to represent small cysts or biliary hamartomas. No new suspicious hepatic lesions. No intra or extrahepatic biliary ductal dilatation. 6 mm calcified gallstone lying dependently in the gallbladder. Gallbladder is not distended. Pancreas: No pancreatic mass. No pancreatic ductal dilatation. No pancreatic or peripancreatic fluid collections or inflammatory changes. Spleen: Unremarkable. Adrenals/Urinary Tract: 1.2 cm low-attenuation lesion in the upper pole of the right kidney and exophytic 2.4 cm low-attenuation lesion in the upper pole of the left kidney, compatible with simple cysts. Other subcentimeter low-attenuation  lesions in both kidneys, too small to definitively characterize, but favored to represent tiny cysts. No hydroureteronephrosis. Urinary bladder is nearly decompressed, but otherwise unremarkable in appearance. Stomach/Bowel: Stomach is unremarkable in appearance. No pathologic dilatation of small bowel or colon. Multiple bowel loops in the left upper quadrant are intimately associated with the large soft tissue mass (discussed below). Colonic diverticulosis, particularly in the sigmoid colon, without definitive evidence to suggest an acute diverticulitis at this time. Vascular/Lymphatic: Aortic atherosclerosis, without evidence of aneurysm or dissection in the abdominal or pelvic vasculature. Extensive lymphadenopathy noted in the abdomen, most evident in the retroperitoneum where there are bulky para-aortic nodal masses measuring up to 2.3 x 4.1 cm near the left renal artery (axial image 41 of series 11), 3.2 x 4.6 cm adjacent to the infrarenal abdominal aorta (axial image 48 of series 11), and 3.4 x 3.2 cm adjacent to the aortic bifurcation (axial image 54 of series 11), all of which appear similar to the recent prior study. Bulky celiac axis and gastrohepatic ligament lymph nodes are noted, measuring up to 1.9 cm in short axis (axial image 24 of series 11). Several prominent but nonenlarged pelvic lymph nodes are noted (nonspecific). Reproductive: Prostate gland and seminal vesicles are unremarkable in appearance. Other: Bulky poorly defined soft tissue mass in the left upper quadrant of the abdomen which is intimately associated with multiple adjacent bowel loops, and difficult to distinctly defined, but estimated to measure approximately 15.5 x 13.5 x 13.4 cm (axial image 38 of series 11 and coronal image 53 of series 14). Moderate volume of ascites. Diffuse enhancement of the peritoneal membranes, indicative of  widespread peritoneal metastatic disease. Extensive omental caking, best appreciated in the left  side of the abdomen where the bulkiest portion of this measures approximately 14.9 x 4.0 cm (axial image 47 of series 11). No pneumoperitoneum. Musculoskeletal: There are no aggressive appearing lytic or blastic lesions noted in the visualized portions of the skeleton. Review of the MIP images confirms the above findings. IMPRESSION: 1. Study is positive for segmental sized pulmonary emboli in the left lung. These appear nonocclusive at this time, and there is no associated pulmonary hemorrhage to suggest frank pulmonary infarct. 2. Bulky soft tissue mass in the left upper quadrant with extensive lymphadenopathy in the chest and abdomen, and evidence of widespread intraperitoneal metastatic disease, as detailed above. 3. Small bilateral pleural effusions lying dependently. 4. Aortic atherosclerosis. 5. Tracheobronchomalacia. 6. Cholelithiasis. 7. Colonic diverticulosis. Electronically Signed   By: Vinnie Langton M.D.   On: 05/13/2020 12:50   CT Abdomen Pelvis W Contrast  Result Date: 05/11/2020 CLINICAL DATA:  69 year old male with history of shortness of breath for the past few days. Abdominal distension. History of mantle cell lymphoma. EXAM: CT ANGIOGRAPHY CHEST CT ABDOMEN AND PELVIS WITH CONTRAST TECHNIQUE: Multidetector CT imaging of the chest was performed using the standard protocol during bolus administration of intravenous contrast. Multiplanar CT image reconstructions and MIPs were obtained to evaluate the vascular anatomy. Multidetector CT imaging of the abdomen and pelvis was performed using the standard protocol during bolus administration of intravenous contrast. CONTRAST:  152mL OMNIPAQUE IOHEXOL 350 MG/ML SOLN COMPARISON:  CT the abdomen and pelvis 04/19/2020. CT the chest 04/19/2020. FINDINGS: CTA CHEST FINDINGS Cardiovascular: Study is limited by patient respiratory motion. However, despite this limitation there are segmental sized filling defects within pulmonary arteries to the left upper  and lower lobes best appreciated on axial images 73 and 106 of series 5, and axial image 148 of series 5 respectively, compatible with pulmonary embolism. Heart size is normal. There is no significant pericardial fluid, thickening or pericardial calcification. Aortic atherosclerosis. No definite coronary artery calcifications. Mediastinum/Nodes: Multiple enlarged anterior mediastinal lymph nodes noted inferiorly, immediately above the right hemidiaphragm, measuring up to 2.3 cm in short axis (axial image 13 of series 11), increased compared to the prior study. Prominent but nonenlarged distal paraesophageal lymph node measuring 8 mm in short axis, nonspecific. Circumferential thickening of the distal third of the esophagus. Mildly enlarged left axillary lymph nodes measuring up to 1.3 cm in short axis. Lungs/Pleura: Collapse of the trachea and mainstem bronchi during expiration, indicative of tracheobronchomalacia. No acute consolidative airspace disease. Small bilateral pleural effusions lying dependently. No definite suspicious appearing pulmonary nodules or masses are noted. Musculoskeletal: There are no aggressive appearing lytic or blastic lesions noted in the visualized portions of the skeleton. Review of the MIP images confirms the above findings. CT ABDOMEN and PELVIS FINDINGS Hepatobiliary: Multiple well-defined low-attenuation lesions are again noted throughout the liver, similar in size, number and distribution to the prior study, largest of which are compatible with simple cysts measuring up to 5.3 x 3.7 cm in segment 2 and for a (axial image 18 of series 11). Smaller lesions are too small to definitively characterize, but also favored to represent small cysts or biliary hamartomas. No new suspicious hepatic lesions. No intra or extrahepatic biliary ductal dilatation. 6 mm calcified gallstone lying dependently in the gallbladder. Gallbladder is not distended. Pancreas: No pancreatic mass. No pancreatic  ductal dilatation. No pancreatic or peripancreatic fluid collections or inflammatory changes. Spleen: Unremarkable. Adrenals/Urinary Tract: 1.2 cm  low-attenuation lesion in the upper pole of the right kidney and exophytic 2.4 cm low-attenuation lesion in the upper pole of the left kidney, compatible with simple cysts. Other subcentimeter low-attenuation lesions in both kidneys, too small to definitively characterize, but favored to represent tiny cysts. No hydroureteronephrosis. Urinary bladder is nearly decompressed, but otherwise unremarkable in appearance. Stomach/Bowel: Stomach is unremarkable in appearance. No pathologic dilatation of small bowel or colon. Multiple bowel loops in the left upper quadrant are intimately associated with the large soft tissue mass (discussed below). Colonic diverticulosis, particularly in the sigmoid colon, without definitive evidence to suggest an acute diverticulitis at this time. Vascular/Lymphatic: Aortic atherosclerosis, without evidence of aneurysm or dissection in the abdominal or pelvic vasculature. Extensive lymphadenopathy noted in the abdomen, most evident in the retroperitoneum where there are bulky para-aortic nodal masses measuring up to 2.3 x 4.1 cm near the left renal artery (axial image 41 of series 11), 3.2 x 4.6 cm adjacent to the infrarenal abdominal aorta (axial image 48 of series 11), and 3.4 x 3.2 cm adjacent to the aortic bifurcation (axial image 54 of series 11), all of which appear similar to the recent prior study. Bulky celiac axis and gastrohepatic ligament lymph nodes are noted, measuring up to 1.9 cm in short axis (axial image 24 of series 11). Several prominent but nonenlarged pelvic lymph nodes are noted (nonspecific). Reproductive: Prostate gland and seminal vesicles are unremarkable in appearance. Other: Bulky poorly defined soft tissue mass in the left upper quadrant of the abdomen which is intimately associated with multiple adjacent bowel  loops, and difficult to distinctly defined, but estimated to measure approximately 15.5 x 13.5 x 13.4 cm (axial image 38 of series 11 and coronal image 53 of series 14). Moderate volume of ascites. Diffuse enhancement of the peritoneal membranes, indicative of widespread peritoneal metastatic disease. Extensive omental caking, best appreciated in the left side of the abdomen where the bulkiest portion of this measures approximately 14.9 x 4.0 cm (axial image 47 of series 11). No pneumoperitoneum. Musculoskeletal: There are no aggressive appearing lytic or blastic lesions noted in the visualized portions of the skeleton. Review of the MIP images confirms the above findings. IMPRESSION: 1. Study is positive for segmental sized pulmonary emboli in the left lung. These appear nonocclusive at this time, and there is no associated pulmonary hemorrhage to suggest frank pulmonary infarct. 2. Bulky soft tissue mass in the left upper quadrant with extensive lymphadenopathy in the chest and abdomen, and evidence of widespread intraperitoneal metastatic disease, as detailed above. 3. Small bilateral pleural effusions lying dependently. 4. Aortic atherosclerosis. 5. Tracheobronchomalacia. 6. Cholelithiasis. 7. Colonic diverticulosis. Electronically Signed   By: Vinnie Langton M.D.   On: 05/19/2020 12:50   US RENAL  Result Date: 05/15/2020 CLINICAL DATA:  69 year old male with acute renal insufficiency. EXAM: RENAL / URINARY TRACT ULTRASOUND COMPLETE COMPARISON:  CT abdomen pelvis dated 04/19/2020. FINDINGS: Right Kidney: Renal measurements: 12.8 x 5.2 x 5.4 cm = volume: 188 mL. Mild parenchyma atrophy. Normal echogenicity. No hydronephrosis or shadowing stone. There is a 1.3 x 1.2 x 1.3 cm complex, partially cystic structure in the upper pole of the right kidney which may represent a cyst containing proteinaceous debris or colloid material. This is similar to the CT of 04/19/2020. Follow-up with ultrasound in 3 months  recommended. Left Kidney: Renal measurements: 13.9 x 5.4 x 5.4 cm = volume: 212 mL. Normal echogenicity. No hydronephrosis or shadowing stone. There is a 2.7 x 2.1 x 2.1 cm  upper pole cyst. Bladder: The urinary bladder is decompressed around a Foley catheter. Other: Small ascites noted. IMPRESSION: 1. No hydronephrosis or shadowing stone. 2. Probable small complex right renal cyst. Follow-up with ultrasound in 3 months recommended. 3. Small ascites. Electronically Signed   By: Anner Crete M.D.   On: 05/15/2020 19:44   DG CHEST PORT 1 VIEW  Result Date: 05/15/2020 CLINICAL DATA:  Intubation. EXAM: PORTABLE CHEST 1 VIEW COMPARISON:  05/12/2020. FINDINGS: Endotracheal tube tip noted 1 cm above the carina in unchanged position. NG tube and right PICC line in stable position. Heart size stable. Bibasilar atelectasis/infiltrates and bilateral pleural effusions, right side greater than left again noted. No interim change from prior exam. No pneumothorax. Degenerative changes scoliosis thoracic spine. IMPRESSION: 1. Endotracheal tube tip noted 1 cm above the carina in unchanged position. NG tube and right PICC line in stable position. 2. Bibasilar atelectasis/infiltrates and bilateral pleural effusions, right side greater than left again noted. No interim change. Electronically Signed   By: Marcello Moores  Register   On: 05/15/2020 12:30   DG CHEST PORT 1 VIEW  Result Date: 05/13/2020 CLINICAL DATA:  Evaluate ETT. EXAM: PORTABLE CHEST 1 VIEW COMPARISON:  May 12, 2020 FINDINGS: The ETT terminates 1.5 cm above the carina. A right PICC line terminates in the central SVC. The distal tip of the NG tube is in the stomach. The side port is likely near the GE junction. The cardiomediastinal silhouette is unchanged. No pneumothorax. Layering effusion with underlying opacity on the right. Is perihilar opacities may represent mild edema. No other acute abnormalities. IMPRESSION: 1. The ETT terminates in the trachea with  the distal tip 1.5 cm above the carina. The patient may benefit from withdrawing 1 cm. 2. The side port of the NG tube is near the GE junction. Consider advancing 2 or 3 cm. The right PICC line is in good position. 3. Increasing right pleural effusion with underlying opacity. 4. Mild perihilar opacities are more pronounced in the interval may represent mild edema. Electronically Signed   By: Dorise Bullion III M.D   On: 05/13/2020 15:33   DG CHEST PORT 1 VIEW  Result Date: 05/12/2020 CLINICAL DATA:  Initial evaluation for intubation. EXAM: PORTABLE CHEST 1 VIEW COMPARISON:  Prior radiograph from 05/11/2020. FINDINGS: Endotracheal tube in place with tip positioned approximately 1.3 cm above the carina. Enteric tube courses into the abdomen, tip not visualized. Cardiac and mediastinal silhouettes are stable. Right-sided PICC catheter in place with tip terminating near the cavoatrial junction. Veiling opacities overlying the bilateral hemidiaphragms consistent with pleural effusions. Associated bibasilar opacities favored to reflect atelectasis and/or effusion, although infiltrates could be considered in the correct clinical setting. Perihilar vascular congestion without overt pulmonary edema. No pneumothorax. No acute osseous finding. IMPRESSION: 1. Endotracheal tube in place with tip positioned approximately 1.3 cm above the carina. 2. Veiling opacities overlying the bilateral hemidiaphragms, consistent with pleural effusions. Associated bibasilar opacities favored to reflect atelectasis and/or effusion, although infiltrates could be considered in the correct clinical setting. Electronically Signed   By: Jeannine Boga M.D.   On: 05/12/2020 03:57   DG CHEST PORT 1 VIEW  Result Date: 05/11/2020 CLINICAL DATA:  Shortness of breath EXAM: PORTABLE CHEST 1 VIEW COMPARISON:  05/11/2020 FINDINGS: Layering bilateral pleural effusions. Bibasilar atelectasis or infiltrates. Heart is normal size. Right PICC  line is in place with the tip in the SVC. IMPRESSION: Layering bilateral pleural effusions with bibasilar atelectasis or infiltrates. Findings similar to prior study. Electronically  Signed   By: Rolm Baptise M.D.   On: 05/11/2020 22:14   DG CHEST PORT 1 VIEW  Result Date: 05/11/2020 CLINICAL DATA:  Atelectasis. EXAM: PORTABLE CHEST 1 VIEW COMPARISON:  05/10/2020.  05/08/2020 FINDINGS: Right PICC line stable position. Heart size stable. Low lung volumes with persistent bibasilar atelectasis. Bilateral interstitial prominence. Interstitial edema and or pneumonitis could present in this fashion. No pleural effusion or pneumothorax. IMPRESSION: 1. Right PICC line in stable position. 2. Low lung volumes with persistent bibasilar atelectasis. Bilateral interstitial prominence. Interstitial edema and/or pneumonitis could present this fashion. Electronically Signed   By: Marcello Moores  Register   On: 05/11/2020 06:55   DG CHEST PORT 1 VIEW  Result Date: 05/10/2020 CLINICAL DATA:  Shortness of breath. Mantle cell lymphoma on chemotherapy EXAM: PORTABLE CHEST 1 VIEW COMPARISON:  05/08/2020 FINDINGS: Interval removal of right IJ approach central venous catheter. Right-sided PICC line remains in place terminating at the level of the distal SVC. Stable cardiomediastinal contours. Low lung volumes. Bibasilar atelectasis with increasing medial right basilar opacity. No appreciable pleural fluid collection. No pneumothorax. IMPRESSION: Bibasilar atelectasis with increasing medial right basilar opacity, atelectasis versus infection. Electronically Signed   By: Davina Poke D.O.   On: 05/10/2020 08:25   DG CHEST PORT 1 VIEW  Result Date: 05/08/2020 CLINICAL DATA:  69 year old male status post PICC placement. EXAM: PORTABLE CHEST 1 VIEW COMPARISON:  Chest radiograph dated 05/08/2020. FINDINGS: Right IJ central venous line in similar position. There has been interval placement of a right-sided PICC with tip over central SVC  close to the cavoatrial junction. There is shallow inspiration with bibasilar atelectasis. No significant interval change in the appearance of the lungs compared to the earlier radiograph. No large pleural effusion or pneumothorax. Stable cardiac silhouette. No acute osseous pathology. IMPRESSION: Interval placement of a right-sided PICC with tip over central SVC close to the cavoatrial junction. Electronically Signed   By: Anner Crete M.D.   On: 05/08/2020 16:44   DG CHEST PORT 1 VIEW  Result Date: 05/08/2020 CLINICAL DATA:  69 year old male with a history of shortness of breath EXAM: PORTABLE CHEST 1 VIEW COMPARISON:  05/06/2020 FINDINGS: Cardiomediastinal silhouette unchanged in size and contour. Low lung volumes persist. Patchy opacities at the lung bases persist. No pneumothorax or pleural effusion. No confluent airspace disease. Unchanged right IJ central venous catheter. No acute displaced fracture IMPRESSION: Unchanged appearance of the chest x-ray with low lung volumes and basilar atelectasis. Right IJ central venous catheter unchanged Electronically Signed   By: Corrie Mckusick D.O.   On: 05/08/2020 13:13   DG CHEST PORT 1 VIEW  Result Date: 05/06/2020 CLINICAL DATA:  Increased shortness of breath, hypertension EXAM: PORTABLE CHEST 1 VIEW COMPARISON:  04/30/2020 FINDINGS: Single frontal view of the chest demonstrates interval extubation and removal of the enteric catheter. Right internal jugular catheter tip projects over superior vena cava. The cardiac silhouette is stable. Lung volumes are diminished, without airspace disease, effusion, or pneumothorax. IMPRESSION: 1. Low lung volumes.  No acute process. Electronically Signed   By: Randa Ngo M.D.   On: 05/06/2020 18:51   DG CHEST PORT 1 VIEW  Result Date: 04/30/2020 CLINICAL DATA:  Central line placement EXAM: PORTABLE CHEST 1 VIEW COMPARISON:  Radiograph 05/08/2020 FINDINGS: Endotracheal tube tip terminates 2.2 cm from the expected  location of the carina. Transesophageal tube tip and side port distal to the GE junction. Right IJ catheter tip terminates in the right atrium. Telemetry leads overlie the chest. Layering  bilateral effusions adjacent passive atelectasis. Additional bandlike subsegmental atelectasis in the lungs as well. Stable borderline cardiomegaly accounting for portable technique. No acute osseous or soft tissue abnormality. Degenerative changes are present in the imaged spine and shoulders. IMPRESSION: 1. Endotracheal tube tip terminates 2.2 cm from the expected location of the carina. Could consider retraction 2 cm to the mid trachea. 2. Transesophageal tube tip and side port distal to the GE junction. 3. Right IJ catheter tip terminates in the right atrium. 4. Layering bilateral effusions with adjacent passive atelectasis and vascular congestion. 5. Stable borderline cardiomegaly. Electronically Signed   By: Lovena Le M.D.   On: 04/30/2020 01:14   DG CHEST PORT 1 VIEW  Result Date: 05/01/2020 CLINICAL DATA:  Endotracheal tube placement EXAM: PORTABLE CHEST 1 VIEW COMPARISON:  April 29, 2020 FINDINGS: The endotracheal tube terminates above the carina. The lung volumes are low. Again identified are bilateral pleural effusions with adjacent atelectasis. The heart size is stable. There is no pneumothorax. The enteric tube terminates below the left hemidiaphragm. IMPRESSION: Lines and tubes as above.  Otherwise, stable exam. Electronically Signed   By: Constance Holster M.D.   On: 05/01/2020 22:04   DG Abd Portable 1V  Result Date: 05/18/2020 CLINICAL DATA:  69 year old male with NG placement. EXAM: PORTABLE ABDOMEN - 1 VIEW COMPARISON:  Abdominal radiograph dated 05/18/2020. FINDINGS: The previously seen enteric tube is no longer visualized. Small bilateral pleural effusions, right greater than left with bibasilar atelectasis or infiltrate. IMPRESSION: 1. Enteric tube is no longer visualized. 2. Small bilateral  pleural effusions, right greater than left, with bibasilar atelectasis or infiltrate. Electronically Signed   By: Anner Crete M.D.   On: 05/18/2020 19:29   DG Abd Portable 1V  Result Date: 05/18/2020 CLINICAL DATA:  OG tube placement EXAM: PORTABLE ABDOMEN - 1 VIEW COMPARISON:  None. FINDINGS: Enteric tube terminates in the distal gastric body. Nonobstructive bowel gas pattern. IMPRESSION: Enteric tube terminates in the distal gastric body. Electronically Signed   By: Julian Hy M.D.   On: 05/18/2020 12:31   DG Abd Portable 2 Views  Result Date: 05/15/2020 CLINICAL DATA:  Abdominal distension and pain. EXAM: PORTABLE ABDOMEN - 2 VIEW COMPARISON:  04/19/2020 FINDINGS: There is mild gaseous distension of the stomach. No dilated loops of large or small bowel. There is no evidence of free air. No radio-opaque calculi or other significant radiographic abnormality is seen. IMPRESSION: Nonobstructive bowel gas pattern. Electronically Signed   By: Kerby Moors M.D.   On: 05/06/2020 06:12   DG Swallowing Func-Speech Pathology  Result Date: 05/04/2020 Objective Swallowing Evaluation: Type of Study: MBS-Modified Barium Swallow Study  Patient Details Name: Shirl Ludington MRN: [id number] Date of Birth: 01-07-1951 Today's Date: 05/04/2020 Time: SLP Start Time (ACUTE ONLY): 0854 -SLP Stop Time (ACUTE ONLY): 0910 SLP Time Calculation (min) (ACUTE ONLY): 16 min Past Medical History: Past Medical History: Diagnosis Date . Cancer ([place])   Mantle Cell Lymphoma . Diabetes mellitus without complication ([place])  . Hypertension  Past Surgical History: Past Surgical History: Procedure Laterality Date . APPENDECTOMY   . TONSILLECTOMY   HPI: Sherrod Toothman is a 69 y.o. male with medical history significant of mantle cell lymphoma on CHOP. Presenting with dyspnea and ab distention. Reports 2 weeks of shortness of breath that has worsened in the last 2 days. ETT 11/7-11/9.  Subjective: alert at bedside Assessment / Plan /  Recommendation CHL IP CLINICAL IMPRESSIONS 05/04/2020 Clinical Impression Pt demonstrated oropharyngeal dysphagia with aspiration of nectar  thick barium marked by delayed initiation and incompetent laryngeal closure. Barium aggregated in his valleculae and pyriform sinuses for approximately 4 seconds filling pyriforms and spilling to vocal cords prior to laryngeal closure. Subsequent trial nectar was aspirated with cough not clearing trachea. He coughed intermittently throughout without new penetrates/aspirates suspect from previous intrusion. Mild delay with honey thick with mild pharyngeal residue. Inconsistent suboptimal movement of hyoid anteriorally and superiorally. Pt clearly fatigued at onset of and during study with increased respirations and decreased endurance. Compensatory strategies not attempted given level of current ability to perform from medical standpoint. Mild lingual residue cleared with independent swallows. Recommend he initiate Dys 1 (puree), honey thick liquid cautiously with pills whole in puree, support for self feeding and rest breaks. Recommendations may be modified if there are changes to plan of care in terms of Palliative.          SLP Visit Diagnosis Dysphagia, oropharyngeal phase (R13.12) Attention and concentration deficit following -- Frontal lobe and executive function deficit following -- Impact on safety and function Moderate aspiration risk   CHL IP TREATMENT RECOMMENDATION 05/04/2020 Treatment Recommendations Therapy as outlined in treatment plan below   Prognosis 05/04/2020 Prognosis for Safe Diet Advancement Good Barriers to Reach Goals (No Data) Barriers/Prognosis Comment -- CHL IP DIET RECOMMENDATION 05/04/2020 SLP Diet Recommendations Honey thick liquids;Dysphagia 1 (Puree) solids Liquid Administration via Cup;No straw Medication Administration Whole meds with puree Compensations Slow rate;Small sips/bites;Other (Comment) Postural Changes Seated upright at 90 degrees    CHL IP OTHER RECOMMENDATIONS 05/04/2020 Recommended Consults -- Oral Care Recommendations Oral care BID Other Recommendations Order thickener from pharmacy   CHL IP FOLLOW UP RECOMMENDATIONS 05/04/2020 Follow up Recommendations Other (comment)   CHL IP FREQUENCY AND DURATION 05/04/2020 Speech Therapy Frequency (ACUTE ONLY) min 2x/week Treatment Duration 2 weeks      CHL IP ORAL PHASE 05/04/2020 Oral Phase Impaired Oral - Pudding Teaspoon -- Oral - Pudding Cup -- Oral - Honey Teaspoon -- Oral - Honey Cup Lingual/palatal residue Oral - Nectar Teaspoon -- Oral - Nectar Cup Lingual/palatal residue Oral - Nectar Straw -- Oral - Thin Teaspoon -- Oral - Thin Cup -- Oral - Thin Straw -- Oral - Puree -- Oral - Mech Soft -- Oral - Regular Lingual/palatal residue Oral - Multi-Consistency -- Oral - Pill -- Oral Phase - Comment --  CHL IP PHARYNGEAL PHASE 05/04/2020 Pharyngeal Phase Impaired Pharyngeal- Pudding Teaspoon -- Pharyngeal -- Pharyngeal- Pudding Cup -- Pharyngeal -- Pharyngeal- Honey Teaspoon -- Pharyngeal -- Pharyngeal- Honey Cup Delayed swallow initiation-vallecula;Pharyngeal residue - pyriform;Pharyngeal residue - valleculae Pharyngeal -- Pharyngeal- Nectar Teaspoon -- Pharyngeal -- Pharyngeal- Nectar Cup Delayed swallow initiation-pyriform sinuses;Penetration/Aspiration during swallow;Pharyngeal residue - pyriform Pharyngeal Material enters airway, remains ABOVE vocal cords and not ejected out;Material enters airway, passes BELOW cords and not ejected out despite cough attempt by patient Pharyngeal- Nectar Straw -- Pharyngeal -- Pharyngeal- Thin Teaspoon -- Pharyngeal -- Pharyngeal- Thin Cup -- Pharyngeal -- Pharyngeal- Thin Straw -- Pharyngeal -- Pharyngeal- Puree -- Pharyngeal -- Pharyngeal- Mechanical Soft -- Pharyngeal -- Pharyngeal- Regular Pharyngeal residue - posterior pharnyx Pharyngeal -- Pharyngeal- Multi-consistency -- Pharyngeal -- Pharyngeal- Pill -- Pharyngeal -- Pharyngeal Comment --  CHL IP  CERVICAL ESOPHAGEAL PHASE 05/04/2020 Cervical Esophageal Phase WFL Pudding Teaspoon -- Pudding Cup -- Honey Teaspoon -- Honey Cup -- Nectar Teaspoon -- Nectar Cup -- Nectar Straw -- Thin Teaspoon -- Thin Cup -- Thin Straw -- Puree -- Mechanical Soft -- Regular -- Multi-consistency -- Pill -- Cervical Esophageal Comment --  [place] Siren 05/04/2020, 10:56 AM Orbie Pyo Colvin Caroli.Ed Actor Pager [phone] Office [phone]              VAS [place] LOWER EXTREMITY VENOUS (DVT)  Result Date: 05/01/2020  Lower Venous DVT Study Indications: Pain, Swelling, and Edema.  Risk Factors: Immobility confirmed PE. Performing Technologist: Griffin Basil RCT RDMS  Examination Guidelines: A complete evaluation includes B-mode imaging, spectral Doppler, color Doppler, and power Doppler as needed of all accessible portions of each vessel. Bilateral testing is considered an integral part of a complete examination. Limited examinations for reoccurring indications may be performed as noted. The reflux portion of the exam is performed with the patient in reverse Trendelenburg.  +---------+---------------+---------+-----------+----------+--------------+ RIGHT    CompressibilityPhasicitySpontaneityPropertiesThrombus Aging +---------+---------------+---------+-----------+----------+--------------+ CFV      Full           Yes      Yes                                 +---------+---------------+---------+-----------+----------+--------------+ SFJ      Full                                                        +---------+---------------+---------+-----------+----------+--------------+ FV Prox  Full                                                        +---------+---------------+---------+-----------+----------+--------------+ FV Mid   Full                                                        +---------+---------------+---------+-----------+----------+--------------+ FV  DistalFull                                                        +---------+---------------+---------+-----------+----------+--------------+ PFV      Full                                                        +---------+---------------+---------+-----------+----------+--------------+ POP      Full           Yes      Yes                                 +---------+---------------+---------+-----------+----------+--------------+ PTV      Full                                                        +---------+---------------+---------+-----------+----------+--------------+  PERO     Full                                                        +---------+---------------+---------+-----------+----------+--------------+   +---------+---------------+---------+-----------+----------+--------------+ LEFT     CompressibilityPhasicitySpontaneityPropertiesThrombus Aging +---------+---------------+---------+-----------+----------+--------------+ CFV      Full           Yes      Yes                                 +---------+---------------+---------+-----------+----------+--------------+ SFJ      Full                                                        +---------+---------------+---------+-----------+----------+--------------+ FV Prox  Full                                                        +---------+---------------+---------+-----------+----------+--------------+ FV Mid   Full                                                        +---------+---------------+---------+-----------+----------+--------------+ FV DistalFull                                                        +---------+---------------+---------+-----------+----------+--------------+ PFV      Full                                                        +---------+---------------+---------+-----------+----------+--------------+ POP      Full           Yes      Yes                                  +---------+---------------+---------+-----------+----------+--------------+ PTV      Full                                                        +---------+---------------+---------+-----------+----------+--------------+ PERO     Full                                                        +---------+---------------+---------+-----------+----------+--------------+  Summary: RIGHT: - There is no evidence of deep vein thrombosis in the lower extremity.  - No cystic structure found in the popliteal fossa.  LEFT: - There is no evidence of deep vein thrombosis in the lower extremity.  - No cystic structure found in the popliteal fossa.  *See table(s) above for measurements and observations. Electronically signed by Curt Jews MD on 05/01/2020 at 2:50:45 PM.    Final    ECHOCARDIOGRAM LIMITED  Result Date: 04/30/2020    ECHOCARDIOGRAM LIMITED REPORT   Patient Name:   PHINEHAS GROUNDS Date of Exam: 04/30/2020 Medical Rec #:  [id number]    Height:       64.0 in Accession #:    [id number]   Weight:       205.0 lb Date of Birth:  [birth year]   BSA:          1.977 m Patient Age:    15 years     BP:           114/60 mmHg Patient Gender: M            HR:           109 bpm. Exam Location:  Inpatient Procedure: Limited Echo, Limited Color Doppler and Cardiac Doppler Indications:    Pulmonary Embolus I26.99  History:        Patient has prior history of Echocardiogram examinations, most                 recent 04/20/2020. Mantle cell lymphoma; Risk                 Factors:Hypertension and Diabetes.  Sonographer:    Mikki Santee RDCS (AE) Referring Phys: [id number] Jonnie Finner  Sonographer Comments: Echo performed with patient supine and on artificial respirator. IMPRESSIONS  1. Cystic structure noted in liver on Subcostal images.  2. Left ventricular ejection fraction, by estimation, is 60 to 65%. The left ventricle has normal function. The left ventricle has no regional wall motion  abnormalities.  3. Right ventricular systolic function is normal. The right ventricular size is normal.  4. The pericardial effusion is posterior to the left ventricle.  5. The mitral valve is normal in structure. No evidence of mitral valve regurgitation. No evidence of mitral stenosis.  6. The aortic valve is normal in structure. Aortic valve regurgitation is not visualized. No aortic stenosis is present.  7. The inferior vena cava is normal in size with greater than 50% respiratory variability, suggesting right atrial pressure of 3 mmHg. FINDINGS  Left Ventricle: Left ventricular ejection fraction, by estimation, is 60 to 65%. The left ventricle has normal function. The left ventricle has no regional wall motion abnormalities. The left ventricular internal cavity size was normal in size. There is  no left ventricular hypertrophy. Right Ventricle: The right ventricular size is normal. No increase in right ventricular wall thickness. Right ventricular systolic function is normal. Left Atrium: Left atrial size was normal in size. Right Atrium: Right atrial size was normal in size. Pericardium: Trivial pericardial effusion is present. The pericardial effusion is posterior to the left ventricle. Mitral Valve: The mitral valve is normal in structure. No evidence of mitral valve stenosis. Tricuspid Valve: The tricuspid valve is normal in structure. Tricuspid valve regurgitation is mild . No evidence of tricuspid stenosis. Aortic Valve: The aortic valve is normal in structure. Aortic valve regurgitation is not visualized. No aortic stenosis is present. Pulmonic Valve: The pulmonic valve was normal in structure.  Pulmonic valve regurgitation is trivial. No evidence of pulmonic stenosis. Aorta: The aortic root is normal in size and structure. Venous: The inferior vena cava is normal in size with greater than 50% respiratory variability, suggesting right atrial pressure of 3 mmHg. IAS/Shunts: No atrial level shunt detected  by color flow Doppler. Additional Comments: Cystic structure noted in liver on Subcostal images. LEFT VENTRICLE PLAX 2D LVIDd:         4.20 cm LVIDs:         2.60 cm LV PW:         1.00 cm LV IVS:        1.10 cm LVOT diam:     2.30 cm LVOT Area:     4.15 cm  RIGHT VENTRICLE RV S prime:     30.80 cm/s TAPSE (M-mode): 1.8 cm LEFT ATRIUM         Index LA diam:    3.00 cm 1.52 cm/m   AORTA Ao Root diam: 3.40 cm  SHUNTS Systemic Diam: 2.30 cm Jenkins Rouge MD Electronically signed by Jenkins Rouge MD Signature Date/Time: 04/30/2020/9:18:44 AM    Final    [place] EKG SITE RITE  Result Date: 05/08/2020 If Site Rite image not attached, placement could not be confirmed due to current cardiac rhythm.  [place] EKG SITE RITE  Result Date: 05/03/2020 If Site Rite image not attached, placement could not be confirmed due to current cardiac rhythm.   Microbiology Recent Results (from the past 240 hour(s))  Culture, respiratory (non-expectorated)     Status: None   Collection Time: 05/13/20 12:20 PM   Specimen: Tracheal Aspirate; Respiratory  Result Value Ref Range Status   Specimen Description   Final    TRACHEAL ASPIRATE Performed at [facility] [address], [place], [place] [address]    Special Requests   Final    NONE Performed at [facility], [place] [address], [place], [place] [address]    Gram Stain   Final    ABUNDANT WBC PRESENT, PREDOMINANTLY PMN FEW GRAM POSITIVE COCCI    Culture   Final    Consistent with normal respiratory flora. No Pseudomonas species isolated Performed at [facility] [address], [place], [place] [address]    Report Status 05/15/2020 FINAL  Final  MRSA PCR Screening     Status: None   Collection Time: 05/13/20  4:25 PM   Specimen: Nasopharyngeal  Result Value Ref Range Status   MRSA by PCR NEGATIVE NEGATIVE Final    Comment:        The GeneXpert MRSA Assay (FDA approved for NASAL specimens only), is one component of  a comprehensive MRSA colonization surveillance program. It is not intended to diagnose MRSA infection nor to guide or monitor treatment for MRSA infections. Performed at [facility], [place] [address] [place], [place] [address]   Culture, respiratory (non-expectorated)     Status: None   Collection Time: 05/17/20  9:49 AM   Specimen: Tracheal Aspirate; Respiratory  Result Value Ref Range Status   Specimen Description   Final    TRACHEAL ASPIRATE Performed at [facility] [address] [place], [place] [address]    Special Requests   Final    NONE Performed at [facility], [place] [address] [place], [place] [address]    Gram Stain   Final    ABUNDANT WBC PRESENT,BOTH PMN AND MONONUCLEAR RARE GRAM POSITIVE COCCI  Culture   Final    Normal respiratory flora-no Staph aureus or Pseudomonas seen Performed at [place] [address] [place], [place] [address]    Report Status 05/19/2020 FINAL  Final    Lab Basic Metabolic Panel: Recent Labs  Lab 05/16/20 0317 05/16/20 1300 05/17/20 0430 05/17/20 0430 05/17/20 1130 05/18/20 0513 05/19/20 0508 05/20/20 0547 06/11/2020 0701  NA 142   < > 139   < > 143 140 142 143 145  K 3.5   < > 2.8*   < > 4.1 3.5 3.8 3.9 3.7  CL 99   < > 90*   < > 103 102 100 100 99  CO2 25   < > 32   < > 20* 20* 19* 19* 22  GLUCOSE 130*   < > 139*   < > 150* 158* 181* 161* 160*  BUN 140*   < > 129*   < > 144* 153* 168* Null_Result 180*  CREATININE 3.31*   < > 3.34*   < > 3.69* 4.26* 4.64* 4.88* 5.07*  CALCIUM 6.5*   < > 5.9*   < > 7.0* 6.6* 6.5* 6.6* 6.4*  MG 1.9  --  1.8  --   --  2.2 2.0 2.0  --   PHOS 7.4*  --  6.0*  --   --  8.3* 9.3* 10.2*  --    < > = values in this interval not displayed.   Liver Function Tests: No results for input(s): AST, ALT, ALKPHOS, BILITOT, PROT, ALBUMIN in the last 168 hours. No results for input(s): LIPASE, AMYLASE in the last 168 hours. No  results for input(s): AMMONIA in the last 168 hours. CBC: Recent Labs  Lab 05/16/20 0317 05/17/20 0430 05/18/20 0115 05/18/20 0513 06/11/20 0701  WBC 3.4* 3.1*  --  3.2* 5.2  HGB 7.2* 6.5* 7.6* 7.5* 7.6*  HCT 21.4* 18.2* 23.5* 23.3* 23.0*  MCV 89.5 89.7  --  92.8 91.6  PLT 77* 74*  --  75* 61*   Cardiac Enzymes: No results for input(s): CKTOTAL, CKMB, CKMBINDEX, TROPONINI in the last 168 hours. Sepsis Labs: Recent Labs  Lab 05/15/20 1457 05/16/20 0317 05/17/20 0430 05/18/20 0513 06/11/20 0701  WBC  --  3.4* 3.1* 3.2* 5.2  LATICACIDVEN 1.1  --   --   --   --     Lenice Llamas, MD Pulmonary and [place] Pager: [phone] Office:[phone]
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
## 2020-05-23 NOTE — Progress Notes (Signed)
Patient noticed to be apneic by bedside visitor.  Nurses called to room.  Two nurses, Alroy Dust and [place], auscultated and palpated for any signs of life.  After one minute no pulse was felt and no cardiac or lung sounds heard.  Patient pronounced dead at 11.  Attending physician notified, [place] [facility] called and post mortem care begun.  63ml Dillaudid wasted and witnessed by [employer].

## 2020-05-23 NOTE — Progress Notes (Signed)
PCCM INTERVAL PROGRESS NOTE   I have had a discussion with HCPOA Nicole Kindred and [name]. They wish to proceed with one way extubation and transition to comfort measures at this time.   They hope that we can extubate him comfortably and he will be able to have some meaningful time with family, but understand his opioid requirements to mitigate air hunger may be to high to allow for this.   Georgann Housekeeper, AGACNP-BC [place]  See Amion for personal pager PCCM on call pager [phone]  06-12-2020 1:17 PM

## 2020-05-23 NOTE — Progress Notes (Signed)
PMT no charge note  Discussed with critical care team and bedside RN Alroy Dust via secure chat. Agree with recommendation to opioid rotate to longer acting opioid in a continuous infusion manner, so as to ensure optimal relief.  Plan: Orders placed for Dilaudid infusion and also IV Dilaudid PRN for relief. May D/C Fentanyl drip once IV Dilaudid infusion is established.  Continue full scope of comfort measures Continue to support family holistically.  No charge Loistine Chance MD [facility].

## 2020-05-23 NOTE — Progress Notes (Signed)
Pharmacy Antibiotic Note  Dean Dixon is a 69 y.o. male admitted on 05/20/2020 with HCAP.  Pharmacy has been consulted for cefepime dosing, antibiotics were broadened on 11/25.   Today, 06/18/2020  WBC WNL  SCr 5.07, CrCl ~15 mL/min  Day #9 of antibiotics; Day #5 of cefepime  Plan:  Continue cefepime 2 g IV q24h   Monitor renal function for necessary dose adjustments  Height: 5\' 4"  (162.6 cm) Weight: 103.3 kg (227 lb 11.8 oz) IBW/kg (Calculated) : 59.2  Temp (24hrs), Avg:98.9 F (37.2 C), Min:97.4 F (36.3 C), Max:99.7 F (37.6 C)  Recent Labs  Lab 05/15/20 0230 05/15/20 0230 05/15/20 1457 05/15/20 2200 05/16/20 0317 05/16/20 1300 05/17/20 0430 05/17/20 0430 05/17/20 1130 05/18/20 0513 05/19/20 0508 05/20/20 0547 June 18, 2020 0701  WBC 3.6*  --   --   --  3.4*  --  3.1*  --   --  3.2*  --   --  5.2  CREATININE 3.35*   < > 3.13*   < > 3.31*   < > 3.34*   < > 3.69* 4.26* 4.64* 4.88* 5.07*  LATICACIDVEN  --   --  1.1  --   --   --   --   --   --   --   --   --   --    < > = values in this interval not displayed.    Estimated Creatinine Clearance: 14.7 mL/min (A) (by C-G formula based on SCr of 5.07 mg/dL (H)).    Allergies  Allergen Reactions  . Ivp Dye [Iodinated Diagnostic Agents] Shortness Of Breath    "allergic to CT scan dye", took benadryl with last scan and tolerated well    Antimicrobials this admission: 11/8 cefepime >> 11/14; resume 11/21 >> 11/23, resume 11/25 >> 11/21 vanc >>  11/22, resume 11/25 >> 11/27  11/23 CTX >> 11/24   Microbiology results: 11/9 BCx: ngF 11/7 MRSA PCR: negative 11/7 Resp panel: neg COVID/Influenza 11/13 UCx ngF 11/21 TA: normal flora 11/21 MRSA PCR: neg 11/25 Resp culture: Normal flora  Thank you for allowing pharmacy to be a part of this patient's care.  Lenis Noon, PharmD 06-18-20 10:50 AM

## 2020-05-23 NOTE — Progress Notes (Signed)
NAME:  Dean Dixon, MRN:  [id number], DOB:  1950-10-25, LOS: 55 ADMISSION DATE:  05/14/2020, CONSULTATION DATE:  11/7/201 REFERRING MD:  TR[facility], CHIEF COMPLAINT:  Respiratory distress  Brief History   69 year old male with stage IV mantle cell lymphoma on CHOP presenting with N/V and progressive SOB x  2 weeks PTA found to have acute left segmental PE, intubated 11/7-11/9 in setting hypertensive emergency/flash, reintubated 11/20 for multifactorial acute hypercapnic hypoxic respiratory failure   History of present illness   HPI obtained from medical chart review as patient is currently intubated and sedated on mechanical ventilation.  69 year old male with prior medical history of stage IV mantle cell lymphoma (diagnosed 06/20/2019) on CHOP, hypertension, diabetes type 2 who presented to [facility] on 11/7 with progressive shortness of breath over the last 2 weeks, worse over the last 2 days with above baseline abdominal distention, non bloody nausea/ vomiting, nonradiating intermittent midsternal chest tightness, and cough with lightly productive greenish nonbloody sputum.  Additionally reported ongoing swelling in his legs and testicles causing difficulty with urination.  Recent hospitalization 10/28 - 11/1 found to have new left upper quadrant abdominal mass and hypercalcemia.    In ER, he was afebrile, hypertensive, mildly tachypneic, and tachycardic with oxygen saturations of 98% on room air.  Noted to have multiple episodes of vomiting and retching treated with zofran.  KUB with non-obstructive gas pattern.   Labs noted for glucose 290 with HgbA1c 6.3, BUN 40, AG 17, lactic acid 3.2-> 2.3, troponin hs 52, BNP 133, D-dimer 2.47, Hgb 11.5/ HCT 35.5, platelets 100, neg COVID/ flu, UA noted turbid urine with 5 ketones, negative for leukocytes/ nitrates.  CXR showed small bilateral pleural effusion with atelectasis and low lung volumes.  He was pretreated given his contrast allergy. CTA PE and CT abd/  pelvis showed segmental nonocclusive left pulmonary embolism without associated hemorrhage or pulmonary infarct.  Additionally noted bulky soft tissue mass in LUQ with extensive lymphadenopathy in the chest and abd with evidence of widespread intraperitoneal metastatic disease, small bilateral pleural effusion, and  tracheobronchomalacia. Started on heparin gtt per pharmacy.   [facility] was [facility] by [facility] to [facility] care.  In unit, he was noted to become hypertensive with BP 250/100 with complaints of chest discomfort with noted tachypnea and lethargy.  He was treated with hydralazine and placed on nitro gtt for blood pressure management.  Repeated EKG was non acute.  ABG on NRB noted 7.291/ 56.7/ 275.  Bipap was attempted however patient kept pulling it off and therefore given concern for impending respiratory failure, he was intubated by anesthesia.  PCCM consulted for further management.   Past Medical History  Mantle cell lymphoma on CHOP, DM, HTN  Significant Hospital Events   11/07 Admitted to [facility]/ decompensated/ intubated 11/08 Weaned on PSV, lasix with 2.2L UOP  11/09 Extubated  11/11 Fever / leukocytosis improved, on abx  11/12 Mental status improved, on RA, palliative radiation  11/20 Reintubated for acute hypercapnic hypoxic respiratory failure  Consults:  PCCM 11/7, 11/20 Palliative Care Oncology  Procedures:  ETT 11/7 > 11/9 TLC R IJ 11/8 > 11/12 ETT 11/19 >>11/23, 11/23>>> PICC 11/16 >>>  Significant Diagnostic Tests:  11/7 KUB >> nonobstructive gas pattern 11/7 CTA PE/  CT abd/ pelvis >> Study is positive for segmental sized pulmonary emboli in the left lung. These appear nonocclusive at this time, and there is no associated pulmonary hemorrhage to suggest frank pulmonary infarct. Bulky soft tissue mass in the left  upper quadrant with extensive lymphadenopathy in the chest and abdomen, and evidence of widespread intraperitoneal metastatic disease. Small bilateral pleural  effusions lying dependently. Aortic atherosclerosis.  Tracheobronchomalacia. Cholelithiasis. Colonic diverticulosis. 11/8 [place] Ascites/ABD >> small volume ascites, insufficient for paracentesis, extensive omental caking and peritoneal nodularity consistent with peritoneal carcinomatosis  11/8 ECHO >> cystic structure noted in liver on subcostal images, LVEF 60-65%, no RWMA, RV systolic function normal, pericardial effusion is posterior to LV 11/8 LE Doppler >> negative for DVT 11/19 CXR bilateral effusions, atelectasis  Micro Data:  SARS 2/ flu PCR 11/7 >> neg MRSA PCR  11/7 >> neg UA 11/7 >> negative  BCx2 11/9 >> neg MRSA PCR  11/21  Neg  Trach aspirate 11/21>GPC> normal flora Trach aspirate 11/25> abundant WBC, rare GPC  >>> nl flora  Antimicrobials:  Previously on azithro/ ceftriaxone, stopped 10/30 Cefepime 11/8 >> 11/14; 11/21-22   Vanc 11/21-22, 11/25 >>>11/27 Ceftriaxone 11/23- 11/24 Cefepime  Restarted  11/25>>>   Scheduled Meds: . allopurinol  100 mg Per Tube BID  . amLODipine  10 mg Per Tube Daily  . chlorhexidine gluconate (MEDLINE KIT)  15 mL Mouth Rinse BID  . Chlorhexidine Gluconate Cloth  6 each Topical Daily  . docusate  100 mg Per Tube BID  . feeding supplement (PROSource TF)  45 mL Per Tube BID  . free water  200 mL Per Tube Q4H  . mouth rinse  15 mL Mouth Rinse 10 times per day  . multivitamin  15 mL Per Tube Daily  . pantoprazole sodium  40 mg Per Tube Q1200  . polyethylene glycol  17 g Per Tube Daily  . sodium chloride flush  10-40 mL Intracatheter Q12H  . sodium chloride flush  10-40 mL Intracatheter Q12H   Continuous Infusions: . ceFEPime (MAXIPIME) IV Stopped (05/20/20 1204)  . dexmedetomidine (PRECEDEX) IV infusion 1 mcg/kg/hr (06/01/20 0758)  . feeding supplement (VITAL HIGH PROTEIN) Stopped (05/18/20 1810)  . fentaNYL infusion INTRAVENOUS 125 mcg/hr (06/01/20 0931)  .  sodium bicarbonate (isotonic) infusion in sterile water 50 mL/hr at 06-01-20  0301   PRN Meds:.acetaminophen **OR** acetaminophen, fentaNYL (SUBLIMAZE) injection, Gerhardt's butt cream, sodium chloride flush, sodium chloride flush   Interim history/subjective:  No acute events overnight.   Objective   Blood pressure (!) 101/48, pulse 66, temperature (!) 97.4 F (36.3 C), temperature source Axillary, resp. rate (!) 22, height 5' 4" (1.626 m), weight 103.3 kg, SpO2 95 %.    Vent Mode: PRVC FiO2 (%):  [30 %] 30 % Set Rate:  [22 bmp] 22 bmp Vt Set:  [470 mL] 470 mL PEEP:  [5 cmH20] 5 cmH20 Pressure Support:  [10 cmH20] 10 cmH20 Plateau Pressure:  [19 cmH20-23 cmH20] 21 cmH20   Intake/Output Summary (Last 24 hours) at 2020-06-01 0932 Last data filed at 05/20/2020 2100 Gross per 24 hour  Intake 1164.25 ml  Output 425 ml  Net 739.25 ml   Filed Weights   05/17/20 0435 05/18/20 0500 05/19/20 0200  Weight: 101 kg 102.7 kg 103.3 kg   Physical Exam:  General:  Overweight male in NAD Neuro:  Sedated. RASS -2. HEENT:  [place]/AT, No JVD noted, PERRL Cardiovascular:  RRR, no MRG Lungs:  Clear bilateral breath sounds.  Abdomen:  Soft, non-distended, non-tender.  Musculoskeletal:  No acute deformity or ROM limitation.  Skin:  Intact, MMM    Resolved Hospital Problem list   DKA Hypercalcemia TLS Massive hemoptysis  Assessment & Plan:   Acute hypoxic & hypercapnic respiratory failure-  multifactorial.  Predominantly now related to lobar pneumonia. Hemoptysis resolved. HAP-persistently febrile overnight, remains leukopenic - Daily SBT/SAT.  - Cefepime day 5/7 - Precedex, fentanyl infusions for RASS 0 to -2. - Family coming today to discuss vent goals.  Possibly for compassionate extubation.   Acute left segmental pulmonary embolism due to malignancy. Venous dopplers neg 11/8. He has history of previous need for Coumadin about 20 years ago-unsure if this was an unprovoked VTE in the past -Heparin infusion on hold due to bleeding after NT suctioning and drop in  hemoglobin. Hb has remained stable since transfusion on 11/26.   Metabolic acidosis- likely attributable to renal failure: -Continue bicarb infusion> severely worsens when infusion stopped -No plans for dialysis; he would be a poor candidate given multiorgan failure and progressive stage IV cancer. Family aware.   Type 2 diabetes with controlled hyperglycemia -SSI as needed -Goal BG 140-180 while admitted to the ICU - Will need to address nutrition pending [place] discussion.   Acute kidney injury: likely due to sepsis. Uric acid levels remain normal.  -Continue enteral free water to maintain normal Na+ -Continue monitoring renal function daily -Strict I's/O -Renally dose meds and avoid nephrotoxic meds   Pancytopenia due to chemotherapy; failing to recover over the past week.  Acute worsening of anemia likely due to hypervolemia, acute on chronic illness, frequent lab draws -Continuing to hold heparin pending family discussion.  -Continue to monitor  Stage IV mantle cell lymphoma with LUQ mass/ extensive metastatic disease Last CHOP 10/30, follows with Dr. Irene Limbo -Appreciate oncology's input.  All future chemo would be with palliative rather than curative intent given progression of his disease.  Unfortunately he would not be a candidate until his functional status improved. -Palliative radiation per radiation oncology  History of tumor lysis syndrome-uric acid level continues increasing likely due to renal dysfunction rather than TLS given lack of hyperkalemia -Continue daily allopurinol -Continue to monitor  GoC  discussed the patient's care with his friend and medical power of attorney Nicole Kindred and [place] wife.  He had been doing very well over the summer, tolerating a cruise in the [place].  Over the last month prior to admission he had deteriorating functional status, but lives alone and had been continuing to care for himself.  He is an " eternal optimist" and would want to  continue aggressive therapy.  He would not want his final days in life to be spent on a ventilator.  They understand my concerns regarding prolonged admission and repeat hospital admission with background progressive cancer despite aggressive therapy.  They are aware that he would require significant rehabilitation prior to being discharged, but feel that he would want to continue aggressive care at this time.  We discussed the potential need for bronchoscopy if his hemoptysis worsens, which they verbally consented to.  They will be out of town for few days but plan to come back on Wednesday. -Power of attorney updated on 11/21 via phone -Updated power of attorney's wife on 11/23. -In person meeting on 11/24-no plans for dialysis.  Will monitor renal function in the coming days. -11/25> updated paula and Nicole Kindred, not escalating care and code status changed to DNR -11/26> [name] updated over the phone. She is talking to [place] regarding plans moving forward. Liberalized visitation. 11/27 > discussed with Nicole Kindred and [name], planning to come in am 11/29 for one way extubation  Best practice:  Diet: NPO Pain/Anxiety/Delirium protocol (if indicated): PAD protocol / on precedex  VAP protocol (if indicated):  Yes DVT prophylaxis: Systemic heparin on hold due to ? Upper airway bleeding GI prophylaxis: PPI Glucose control: CBG q4h Mobility: BR Code Status: DNR  Family Communication: with POA 11/27     Labs   CBC: Recent Labs  Lab 05/15/20 0230 05/15/20 0230 05/16/20 0317 05/17/20 0430 05/18/20 0115 05/18/20 0513 06/01/20 0701  WBC 3.6*  --  3.4* 3.1*  --  3.2* 5.2  HGB 9.1*   < > 7.2* 6.5* 7.6* 7.5* 7.6*  HCT 27.3*   < > 21.4* 18.2* 23.5* 23.3* 23.0*  MCV 92.2  --  89.5 89.7  --  92.8 91.6  PLT 86*  --  77* 74*  --  75* 61*   < > = values in this interval not displayed.    Basic Metabolic Panel: Recent Labs  Lab 05/16/20 0317 05/16/20 1300 05/17/20 0430 05/17/20 0430 05/17/20 1130  05/18/20 0513 05/19/20 0508 05/20/20 0547 06-01-20 0701  NA 142   < > 139   < > 143 140 142 143 145  K 3.5   < > 2.8*   < > 4.1 3.5 3.8 3.9 3.7  CL 99   < > 90*   < > 103 102 100 100 99  CO2 25   < > 32   < > 20* 20* 19* 19* 22  GLUCOSE 130*   < > 139*   < > 150* 158* 181* 161* 160*  BUN 140*   < > 129*   < > 144* 153* 168* Null_Result 180*  CREATININE 3.31*   < > 3.34*   < > 3.69* 4.26* 4.64* 4.88* 5.07*  CALCIUM 6.5*   < > 5.9*   < > 7.0* 6.6* 6.5* 6.6* 6.4*  MG 1.9  --  1.8  --   --  2.2 2.0 2.0  --   PHOS 7.4*  --  6.0*  --   --  8.3* 9.3* 10.2*  --    < > = values in this interval not displayed.   GFR: Estimated Creatinine Clearance: 14.7 mL/min (A) (by C-G formula based on SCr of 5.07 mg/dL (H)). Recent Labs  Lab 05/15/20 0230 05/15/20 1457 05/16/20 0317 05/17/20 0430 05/18/20 0513 06/01/20 0701  WBC   < >  --  3.4* 3.1* 3.2* 5.2  LATICACIDVEN  --  1.1  --   --   --   --    < > = values in this interval not displayed.    Liver Function Tests: No results for input(s): AST, ALT, ALKPHOS, BILITOT, PROT, ALBUMIN in the last 168 hours. No results for input(s): LIPASE, AMYLASE in the last 168 hours. No results for input(s): AMMONIA in the last 168 hours.  ABG    Component Value Date/Time   PHART 7.374 05/16/2020 0330   PCO2ART 32.0 05/16/2020 0330   PO2ART 79.1 (L) 05/16/2020 0330   HCO3 18.2 (L) 05/16/2020 0330   TCO2 29 04/27/2020 2146   ACIDBASEDEF 5.8 (H) 05/16/2020 0330   O2SAT 95.8 05/16/2020 0330     Coagulation Profile: No results for input(s): INR, PROTIME in the last 168 hours.  Cardiac Enzymes: No results for input(s): CKTOTAL, CKMB, CKMBINDEX, TROPONINI in the last 168 hours.  HbA1C: Hgb A1c MFr Bld  Date/Time Value Ref Range Status  05/06/2020 05:49 PM 6.3 (H) 4.8 - 5.6 % Final    Comment:    (NOTE) Pre diabetes:          5.7%-6.4%  Diabetes:              >  6.4%  Glycemic control for   <7.0% adults with diabetes   11/14/2019 06:13 AM 5.4  4.8 - 5.6 % Final    Comment:    (NOTE) Pre diabetes:          5.7%-6.4% Diabetes:              >6.4% Glycemic control for   <7.0% adults with diabetes     CBG: Recent Labs  Lab 05/17/20 1613 05/17/20 1954 05/18/20 0007 05/18/20 0757 05/18/20 1206  GLUCAP 148* 112* 150* 101* 153*     Critical care time 36 minutes due to renal failure, respiratory failure on vent.    Georgann Housekeeper, AGACNP-BC [place]  See Amion for personal pager PCCM on call pager [phone]  June 19, 2020 10:03 AM

## 2020-05-23 DEATH — deceased

## 2023-05-14 NOTE — Telephone Encounter (Signed)
Telephone call
